# Patient Record
Sex: Male | Born: 1945
Health system: Southern US, Community
[De-identification: ages and names within clinical notes are randomized; demographics above are authoritative.]

## PROBLEM LIST (undated history)

## (undated) DIAGNOSIS — I2510 Atherosclerotic heart disease of native coronary artery without angina pectoris: Secondary | ICD-10-CM

## (undated) DIAGNOSIS — J189 Pneumonia, unspecified organism: Secondary | ICD-10-CM

## (undated) DIAGNOSIS — R011 Cardiac murmur, unspecified: Secondary | ICD-10-CM

## (undated) DIAGNOSIS — K432 Incisional hernia without obstruction or gangrene: Secondary | ICD-10-CM

## (undated) DIAGNOSIS — E039 Hypothyroidism, unspecified: Secondary | ICD-10-CM

## (undated) DIAGNOSIS — I10 Essential (primary) hypertension: Secondary | ICD-10-CM

## (undated) DIAGNOSIS — C649 Malignant neoplasm of unspecified kidney, except renal pelvis: Secondary | ICD-10-CM

## (undated) DIAGNOSIS — C3490 Malignant neoplasm of unspecified part of unspecified bronchus or lung: Secondary | ICD-10-CM

## (undated) DIAGNOSIS — N189 Chronic kidney disease, unspecified: Secondary | ICD-10-CM

## (undated) DIAGNOSIS — M1990 Unspecified osteoarthritis, unspecified site: Secondary | ICD-10-CM

## (undated) DIAGNOSIS — N2889 Other specified disorders of kidney and ureter: Secondary | ICD-10-CM

## (undated) DIAGNOSIS — C7800 Secondary malignant neoplasm of unspecified lung: Secondary | ICD-10-CM

## (undated) DIAGNOSIS — K429 Umbilical hernia without obstruction or gangrene: Secondary | ICD-10-CM

## (undated) DIAGNOSIS — K219 Gastro-esophageal reflux disease without esophagitis: Secondary | ICD-10-CM

## (undated) DIAGNOSIS — Z85118 Personal history of other malignant neoplasm of bronchus and lung: Secondary | ICD-10-CM

## (undated) DIAGNOSIS — E7800 Pure hypercholesterolemia, unspecified: Secondary | ICD-10-CM

## (undated) DIAGNOSIS — I493 Ventricular premature depolarization: Secondary | ICD-10-CM

## (undated) HISTORY — DX: Ventricular premature depolarization: I49.3

## (undated) HISTORY — PX: APPENDECTOMY: SHX54

---
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
## 1978-10-18 DIAGNOSIS — Z85118 Personal history of other malignant neoplasm of bronchus and lung: Secondary | ICD-10-CM

## 1978-10-18 HISTORY — PX: CHOLECYSTECTOMY: SHX55

## 1978-10-18 HISTORY — DX: Personal history of other malignant neoplasm of bronchus and lung: Z85.118

## 1990-02-16 HISTORY — PX: LUNG LOBECTOMY: SHX167

## 1991-02-17 DIAGNOSIS — C3490 Malignant neoplasm of unspecified part of unspecified bronchus or lung: Secondary | ICD-10-CM

## 1991-02-17 HISTORY — DX: Malignant neoplasm of unspecified part of unspecified bronchus or lung: C34.90

## 2000-04-19 ENCOUNTER — Encounter: Admission: RE | Admit: 2000-04-19 | Discharge: 2000-04-19 | Payer: Self-pay | Admitting: Gastroenterology

## 2000-04-19 ENCOUNTER — Encounter: Payer: Self-pay | Admitting: Gastroenterology

## 2000-05-07 ENCOUNTER — Other Ambulatory Visit: Admission: RE | Admit: 2000-05-07 | Discharge: 2000-05-07 | Payer: Self-pay | Admitting: Gastroenterology

## 2008-06-27 ENCOUNTER — Ambulatory Visit (HOSPITAL_COMMUNITY): Admission: RE | Admit: 2008-06-27 | Discharge: 2008-06-27 | Payer: Self-pay | Admitting: Cardiology

## 2009-02-16 HISTORY — PX: PILONIDAL CYST EXCISION: SHX744

## 2009-11-15 ENCOUNTER — Encounter (INDEPENDENT_AMBULATORY_CARE_PROVIDER_SITE_OTHER): Payer: Self-pay | Admitting: Surgery

## 2009-11-15 ENCOUNTER — Ambulatory Visit (HOSPITAL_COMMUNITY): Admission: RE | Admit: 2009-11-15 | Discharge: 2009-11-16 | Payer: Self-pay | Admitting: Surgery

## 2010-05-01 LAB — DIFFERENTIAL
Basophils Absolute: 0 10*3/uL (ref 0.0–0.1)
Basophils Relative: 0 % (ref 0–1)
Eosinophils Absolute: 0.2 10*3/uL (ref 0.0–0.7)
Eosinophils Relative: 3 % (ref 0–5)
Lymphocytes Relative: 25 % (ref 12–46)
Lymphs Abs: 1.7 10*3/uL (ref 0.7–4.0)
Monocytes Absolute: 0.7 10*3/uL (ref 0.1–1.0)
Monocytes Relative: 10 % (ref 3–12)
Neutro Abs: 4 10*3/uL (ref 1.7–7.7)
Neutrophils Relative %: 61 % (ref 43–77)

## 2010-05-01 LAB — CBC
HCT: 41.4 % (ref 39.0–52.0)
Hemoglobin: 14.4 g/dL (ref 13.0–17.0)
MCH: 31.7 pg (ref 26.0–34.0)
MCHC: 34.9 g/dL (ref 30.0–36.0)
MCV: 90.8 fL (ref 78.0–100.0)
Platelets: 207 10*3/uL (ref 150–400)
RBC: 4.56 MIL/uL (ref 4.22–5.81)
RDW: 12.9 % (ref 11.5–15.5)
WBC: 6.6 10*3/uL (ref 4.0–10.5)

## 2010-05-01 LAB — SURGICAL PCR SCREEN: MRSA, PCR: NEGATIVE

## 2010-05-01 LAB — URINALYSIS, ROUTINE W REFLEX MICROSCOPIC
Bilirubin Urine: NEGATIVE
Hgb urine dipstick: NEGATIVE
Specific Gravity, Urine: 1.02 (ref 1.005–1.030)
pH: 6 (ref 5.0–8.0)

## 2010-05-01 LAB — BASIC METABOLIC PANEL
BUN: 18 mg/dL (ref 6–23)
CO2: 32 mEq/L (ref 19–32)
Calcium: 9.3 mg/dL (ref 8.4–10.5)
Chloride: 101 mEq/L (ref 96–112)
Creatinine, Ser: 1.03 mg/dL (ref 0.4–1.5)
GFR calc Af Amer: >60 mL/min (ref >60)
GFR calc non Af Amer: >60 mL/min (ref >60)
Glucose, Bld: 118 mg/dL — ABNORMAL HIGH (ref 70–99)
Potassium: 4.2 mEq/L (ref 3.5–5.1)
Sodium: 139 mEq/L (ref 135–145)

## 2010-05-01 LAB — PROTIME-INR
INR: 0.97 (ref 0.00–1.49)
Prothrombin Time: 13.1 seconds (ref 11.6–15.2)

## 2010-07-01 NOTE — Cardiovascular Report (Signed)
[name], [name]                  ACCOUNT NO.:  [id number]   MEDICAL RECORD NO.:  [id number]          PATIENT TYPE:  OIB   LOCATION:  2899                         FACILITY:  [facility]   PHYSICIAN:  Ezzard Standing, M.D.DATE OF BIRTH:  1945-03-08   DATE OF PROCEDURE:  06/27/2008  DATE OF DISCHARGE:                            CARDIAC CATHETERIZATION   HISTORY:  A 65 year old male with previous pneumonectomy for lung  cancer.  The patient presented with exertional fatigue and chest  discomfort and had an abnormal Cardiolite study with inferior ischemia.   PROCEDURE:  Left heart catheterization with coronary angiograms and left  ventriculogram,.   PROCEDURE:  The patient tolerated the procedure well without  complications.  It was done as an outpatient basis.  The right femoral  artery was introduced and a single anterior needle wall stick and  catheters used were 6-French catheters.  A no-torque right coronary  catheter was necessary to select the right coronary ostium.  He  tolerated the procedure well.   HEMODYNAMIC DATA:  Aorta postcontrast 142/70, LV postcontrast 142/8-15.   ANGIOGRAPHIC DATA:  Left ventriculogram:  Performed in the 30 degrees  RAO projection.  The aortic valve is normal.  The mitral valve is  normal.  The left ventricle appears normal in size.  Estimated ejection  fraction is 65-70%.  Coronary arteries arise and distribute normally.  There is calcification noted involving all three vessels.  It is mild to  moderate in nature.  Left main coronary artery is normal.  Left anterior  descending has either an occluded first diagonal branch or an ulcerated  plaque in the proximal portion of the vessel prior to the septal  perforator.  Very little in the way of collaterals is seen.  There is a  mild diffuse disease present and a 30% mid stenosis is noted.   CIRCUMFLEX CORONARY ARTERY:  An intermediate branch has a long segmental  80% stenosis and is somewhat small in  portion.  The circumflex has a  first marginal branch which has a segmental 50% stenosis which is  segmental in its proximal portion.  Collateral filling is seen of the  distal right coronary artery with bidirectional flow noted.  The right  coronary artery is calcified.  There is a long severe segmental stenosis  of 95-99% in the proximal to the midportion of the vessel with was some  evidence of bridging collaterals, but forward antegrade flow was noted.   IMPRESSION:  Coronary artery disease with subtotal occlusion of the  right coronary artery with bridging collaterals and left-to-right  collaterals, diffuse disease involving the proximal LAD, first marginal  branch intermediate branch.   RECOMMENDATIONS:  Initial intensive medical therapy at this time.  Consultation with interventional cardiologist to determine if PCI an  option, but angiographically appears to not be a good candidate for  this.      Ezzard Standing, M.D.  Electronically Signed     WST/MEDQ  D:  06/27/2008  T:  06/27/2008  Job:  [id number]   cc:   Claris Gower, M.D.

## 2012-09-09 ENCOUNTER — Ambulatory Visit
Admission: RE | Admit: 2012-09-09 | Discharge: 2012-09-09 | Disposition: A | Discharge status: Home / Self Care | Payer: Medicare Other | Source: Ambulatory Visit | Attending: Cardiology | Admitting: Cardiology

## 2012-09-09 ENCOUNTER — Other Ambulatory Visit: Payer: Self-pay | Admitting: Cardiology

## 2012-09-09 DIAGNOSIS — R5383 Other fatigue: Secondary | ICD-10-CM

## 2012-09-09 DIAGNOSIS — R5381 Other malaise: Secondary | ICD-10-CM

## 2014-10-26 ENCOUNTER — Emergency Department (HOSPITAL_COMMUNITY)
Admission: EM | Admit: 2014-10-26 | Discharge: 2014-10-26 | Disposition: A | Discharge status: Home / Self Care | Payer: Medicare Other | Attending: Emergency Medicine | Admitting: Emergency Medicine

## 2014-10-26 ENCOUNTER — Encounter (HOSPITAL_COMMUNITY): Payer: Self-pay | Admitting: *Deleted

## 2014-10-26 ENCOUNTER — Emergency Department (HOSPITAL_COMMUNITY): Payer: Medicare Other

## 2014-10-26 DIAGNOSIS — Z87891 Personal history of nicotine dependence: Secondary | ICD-10-CM | POA: Diagnosis not present

## 2014-10-26 DIAGNOSIS — I2510 Atherosclerotic heart disease of native coronary artery without angina pectoris: Secondary | ICD-10-CM | POA: Diagnosis not present

## 2014-10-26 DIAGNOSIS — Z85118 Personal history of other malignant neoplasm of bronchus and lung: Secondary | ICD-10-CM | POA: Diagnosis not present

## 2014-10-26 DIAGNOSIS — Z8639 Personal history of other endocrine, nutritional and metabolic disease: Secondary | ICD-10-CM | POA: Diagnosis not present

## 2014-10-26 DIAGNOSIS — R002 Palpitations: Secondary | ICD-10-CM | POA: Diagnosis not present

## 2014-10-26 HISTORY — DX: Atherosclerotic heart disease of native coronary artery without angina pectoris: I25.10

## 2014-10-26 HISTORY — DX: Malignant neoplasm of unspecified part of unspecified bronchus or lung: C34.90

## 2014-10-26 HISTORY — DX: Pure hypercholesterolemia, unspecified: E78.00

## 2014-10-26 LAB — BASIC METABOLIC PANEL
Anion gap: 7 (ref 5–15)
BUN: 19 mg/dL (ref 6–20)
CHLORIDE: 101 mmol/L (ref 101–111)
CO2: 30 mmol/L (ref 22–32)
CREATININE: 1.2 mg/dL (ref 0.61–1.24)
Calcium: 9.5 mg/dL (ref 8.9–10.3)
GFR calc Af Amer: >60 mL/min (ref >60)
GFR calc non Af Amer: 59 mL/min — ABNORMAL LOW (ref >60)
GLUCOSE: 124 mg/dL — AB (ref 65–99)
Potassium: 4.1 mmol/L (ref 3.5–5.1)
SODIUM: 138 mmol/L (ref 135–145)

## 2014-10-26 LAB — CBC
HCT: 41.2 % (ref 39.0–52.0)
Hemoglobin: 14.3 g/dL (ref 13.0–17.0)
MCH: 30.8 pg (ref 26.0–34.0)
MCHC: 34.7 g/dL (ref 30.0–36.0)
MCV: 88.8 fL (ref 78.0–100.0)
PLATELETS: 200 10*3/uL (ref 150–400)
RBC: 4.64 MIL/uL (ref 4.22–5.81)
RDW: 12.4 % (ref 11.5–15.5)
WBC: 6.8 10*3/uL (ref 4.0–10.5)

## 2014-10-26 LAB — I-STAT TROPONIN, ED: Troponin i, poc: 0 ng/mL (ref 0.00–0.08)

## 2014-10-26 NOTE — ED Notes (Signed)
Pt reports having episode of palpitations this am. Denies any cp or sob. ekg done at triage, HR 89.

## 2014-10-26 NOTE — Discharge Instructions (Signed)

## 2014-10-26 NOTE — ED Provider Notes (Signed)
CSN: [id number]     Arrival date & time 10/26/14  1218 History   First MD Initiated Contact with Patient 10/26/14 1639     Chief Complaint  Patient presents with  . Palpitations   Patient is a 69 y.o. male presenting with palpitations. The history is provided by the patient.  Palpitations Palpitations quality:  Fast Onset quality:  Sudden Duration:  30 minutes (This episode occurred around 1030 am.) Timing:  Intermittent Progression:  Resolved Chronicity:  Recurrent Relieved by:  Nothing Worsened by:  Nothing Ineffective treatments:  None tried Associated symptoms: no chest pain, no shortness of breath, no syncope, no vomiting and no weakness   Risk factors: heart disease   Risk factors: no hx of atrial fibrillation   Pt has had it in the past.  He sees Dr Wynonia Lawman.  He did wear a holter monitor for a period of time but they did not find anything.  Since the episode earlier today he has felt fine.  No recurrent episodes.  He called his heart doctor and was told to come to the ED.  Past Medical History  Diagnosis Date  . Coronary artery disease   . Cancer   . Lung cancer   . High cholesterol    Past Surgical History  Procedure Laterality Date  . Lung lobectomy     History reviewed. No pertinent family history. Social History  Substance Use Topics  . Smoking status: Former Research scientist (life sciences)  . Smokeless tobacco: None  . Alcohol Use: No    Review of Systems  Respiratory: Negative for shortness of breath.   Cardiovascular: Positive for palpitations. Negative for chest pain and syncope.  Gastrointestinal: Negative for vomiting.  Neurological: Negative for weakness.      Allergies  Review of patient's allergies indicates no known allergies.  Home Medications   Prior to Admission medications   Not on File   BP 110/86 mmHg  Pulse 92  Temp(Src) 98.1 F (36.7 C) (Oral)  Resp 18  Ht '5\' 9"'$  (1.753 m)  Wt 212 lb 3.2 oz (96.253 kg)  BMI 31.32 kg/m2  SpO2 95% Physical Exam   Constitutional: He appears well-developed and well-nourished. No distress.  HENT:  Head: Normocephalic and atraumatic.  Right Ear: External ear normal.  Left Ear: External ear normal.  Eyes: Conjunctivae are normal. Right eye exhibits no discharge. Left eye exhibits no discharge. No scleral icterus.  Neck: Neck supple. No tracheal deviation present.  Cardiovascular: Normal rate, regular rhythm and intact distal pulses.   Pulmonary/Chest: Effort normal and breath sounds normal. No stridor. No respiratory distress. He has no wheezes. He has no rales.  Abdominal: Soft. Bowel sounds are normal. He exhibits no distension. There is no tenderness. There is no rebound and no guarding.  Musculoskeletal: He exhibits no edema or tenderness.  Neurological: He is alert. He has normal strength. No cranial nerve deficit (no facial droop, extraocular movements intact, no slurred speech) or sensory deficit. He exhibits normal muscle tone. He displays no seizure activity. Coordination normal.  Skin: Skin is warm and dry. No rash noted.  Psychiatric: He has a normal mood and affect.  Nursing note and vitals reviewed.   ED Course  Procedures (including critical care time) Labs Review Labs Reviewed  BASIC METABOLIC PANEL - Abnormal; Notable for the following:    Glucose, Bld 124 (*)    GFR calc non Af Amer 59 (*)    All other components within normal limits  CBC  I-STAT TROPOININ, ED  Imaging Review Dg Chest 2 View  10/26/2014   CLINICAL DATA:  Lung cancer.  Palpitations  EXAM: CHEST  2 VIEW  COMPARISON:  07/25/2014a  FINDINGS: Postop lobectomy on the right. Surgical clips in the right hilum. Volume loss in the right lung base with pleural and parenchymal scarring, unchanged from the prior study. No recurrent mass. Negative for pneumonia. 8 mm left upper lobe density is stable.  IMPRESSION: Postop change on the right are stable.  No acute abnormality.   Electronically Signed   By: Franchot Gallo M.D.    On: 10/26/2014 13:09   I have personally reviewed and evaluated these images and lab results as part of my medical decision-making.   EKG Interpretation   Date/Time:  Friday October 26 2014 12:26:48 EDT Ventricular Rate:  89 PR Interval:  164 QRS Duration: 78 QT Interval:  346 QTC Calculation: 420 R Axis:   15 Text Interpretation:  Normal sinus rhythm with sinus arrhythmia Normal ECG  Confirmed by Hazle Coca [phone]) on 10/26/2014 4:27:57 PM      MDM   Final diagnoses:  Palpitations    Patient's EKG showed a normal sinus rhythm. He has had no further episodes since the episode this morning. Patient states he had a similar episode several months ago and wore some type of cardiac monitor for 1 month. They never found any abnormal heart rhythms. This is the first time this has happened since that previous episode. Patient did not lose consciousness. He did not have syncope. I think he is stable for outpatient follow-up with his cardiologist. They may consider further cardiac monitoring.    Dorie Rank, MD 10/26/14 1710

## 2014-12-27 ENCOUNTER — Other Ambulatory Visit (HOSPITAL_COMMUNITY): Payer: Self-pay | Admitting: Family Medicine

## 2014-12-27 DIAGNOSIS — N2889 Other specified disorders of kidney and ureter: Secondary | ICD-10-CM

## 2014-12-28 ENCOUNTER — Inpatient Hospital Stay
Admission: RE | Admit: 2014-12-28 | Discharge: 2014-12-28 | Disposition: A | Discharge status: Home / Self Care | Payer: Self-pay | Source: Ambulatory Visit | Attending: Family Medicine | Admitting: Family Medicine

## 2014-12-28 ENCOUNTER — Other Ambulatory Visit (HOSPITAL_COMMUNITY): Payer: Self-pay | Admitting: Family Medicine

## 2014-12-28 DIAGNOSIS — R52 Pain, unspecified: Secondary | ICD-10-CM

## 2015-01-15 ENCOUNTER — Other Ambulatory Visit: Payer: Self-pay | Admitting: Urology

## 2015-01-28 NOTE — Patient Instructions (Addendum)
YOUR PROCEDURE IS SCHEDULED ON :  02/01/15  REPORT TO [facility] MAIN ENTRANCE FOLLOW SIGNS TO EAST ELEVATOR - GO TO 3rd FLOOR CHECK IN AT 3 EAST NURSES STATION (SHORT STAY) AT:  5:15 AM  CALL THIS NUMBER IF YOU HAVE PROBLEMS THE MORNING OF SURGERY [phone]  REMEMBER:ONLY 1 PER PERSON MAY GO TO SHORT STAY WITH YOU TO GET READY THE MORNING OF YOUR SURGERY  DO NOT EAT FOOD OR DRINK LIQUIDS AFTER MIDNIGHT  TAKE THESE MEDICINES THE MORNING OF SURGERY: AMLODIPINE / LIPITOR / OMEPRAZOLE  YOU MAY NOT HAVE ANY METAL ON YOUR BODY INCLUDING HAIR PINS AND PIERCING'S. DO NOT WEAR JEWELRY, MAKEUP, LOTIONS, POWDERS OR PERFUMES. DO NOT WEAR NAIL POLISH. DO NOT SHAVE 48 HRS PRIOR TO SURGERY. MEN MAY SHAVE FACE AND NECK.  DO NOT [facility]. [facility] IS NOT RESPONSIBLE FOR VALUABLES.  CONTACTS, DENTURES OR PARTIALS MAY NOT BE WORN TO SURGERY. LEAVE SUITCASE IN CAR. CAN BE BROUGHT TO ROOM AFTER SURGERY.  PATIENTS DISCHARGED THE DAY OF SURGERY WILL NOT BE ALLOWED TO DRIVE HOME.  PLEASE READ OVER THE FOLLOWING INSTRUCTION SHEETS _________________________________________________________________________________                                          [facility] - PREPARING FOR SURGERY  Before surgery, you can play an important role.  Because skin is not sterile, your skin needs to be as free of germs as possible.  You can reduce the number of germs on your skin by washing with CHG (chlorahexidine gluconate) soap before surgery.  CHG is an antiseptic cleaner which kills germs and bonds with the skin to continue killing germs even after washing. Please DO NOT use if you have an allergy to CHG or antibacterial soaps.  If your skin becomes reddened/irritated stop using the CHG and inform your nurse when you arrive at Short Stay. Do not shave (including legs and underarms) for at least 48 hours prior to the first CHG shower.  You may shave your face. Please follow  these instructions carefully:   1.  Shower with CHG Soap the night before surgery and the  morning of Surgery.   2.  If you choose to wash your hair, wash your hair first as usual with your  normal  Shampoo.   3.  After you shampoo, rinse your hair and body thoroughly to remove the  shampoo.                                         4.  Use CHG as you would any other liquid soap.  You can apply chg directly  to the skin and wash . Gently wash with scrungie or clean wascloth    5.  Apply the CHG Soap to your body ONLY FROM THE NECK DOWN.   Do not use on open                           Wound or open sores. Avoid contact with eyes, ears mouth and genitals (private parts).                        Genitals (private parts) with your  normal soap.              6.  Wash thoroughly, paying special attention to the area where your surgery  will be performed.   7.  Thoroughly rinse your body with warm water from the neck down.   8.  DO NOT shower/wash with your normal soap after using and rinsing off  the CHG Soap .                9.  Pat yourself dry with a clean towel.             10.  Wear clean night clothes to bed after shower             11.  Place clean sheets on your bed the night of your first shower and do not  sleep with pets.  Day of Surgery : Do not apply any lotions/deodorants the morning of surgery.  Please wear clean clothes to the hospital/surgery center.  FAILURE TO FOLLOW THESE INSTRUCTIONS MAY RESULT IN THE CANCELLATION OF YOUR SURGERY    PATIENT SIGNATURE_________________________________  ______________________________________________________________________    WHAT IS A BLOOD TRANSFUSION? Blood Transfusion Information  A transfusion is the replacement of blood or some of its parts. Blood is made up of multiple cells which provide different functions.  Red blood cells carry oxygen and are used for blood loss replacement.  White blood cells fight against  infection.  Platelets control bleeding.  Plasma helps clot blood.  Other blood products are available for specialized needs, such as hemophilia or other clotting disorders. BEFORE THE TRANSFUSION  Who gives blood for transfusions?   Healthy volunteers who are fully evaluated to make sure their blood is safe. This is blood bank blood. Transfusion therapy is the safest it has ever been in the practice of medicine. Before blood is taken from a donor, a complete history is taken to make sure that person has no history of diseases nor engages in risky social behavior (examples are intravenous drug use or sexual activity with multiple partners). The donor's travel history is screened to minimize risk of transmitting infections, such as malaria. The donated blood is tested for signs of infectious diseases, such as HIV and hepatitis. The blood is then tested to be sure it is compatible with you in order to minimize the chance of a transfusion reaction. If you or a relative donates blood, this is often done in anticipation of surgery and is not appropriate for emergency situations. It takes many days to process the donated blood. RISKS AND COMPLICATIONS Although transfusion therapy is very safe and saves many lives, the main dangers of transfusion include:   Getting an infectious disease.  Developing a transfusion reaction. This is an allergic reaction to something in the blood you were given. Every precaution is taken to prevent this. The decision to have a blood transfusion has been considered carefully by your caregiver before blood is given. Blood is not given unless the benefits outweigh the risks. AFTER THE TRANSFUSION  Right after receiving a blood transfusion, you will usually feel much better and more energetic. This is especially true if your red blood cells have gotten low (anemic). The transfusion raises the level of the red blood cells which carry oxygen, and this usually causes an energy  increase.  The nurse administering the transfusion will monitor you carefully for complications. HOME CARE INSTRUCTIONS  No special instructions are needed after a transfusion. You may find your energy is better.  Speak with your caregiver about any limitations on activity for underlying diseases you may have. SEEK MEDICAL CARE IF:   Your condition is not improving after your transfusion.  You develop redness or irritation at the intravenous (IV) site. SEEK IMMEDIATE MEDICAL CARE IF:  Any of the following symptoms occur over the next 12 hours:  Shaking chills.  You have a temperature by mouth above 102 F (38.9 C), not controlled by medicine.  Chest, back, or muscle pain.  People around you feel you are not acting correctly or are confused.  Shortness of breath or difficulty breathing.  Dizziness and fainting.  You get a rash or develop hives.  You have a decrease in urine output.  Your urine turns a dark color or changes to pink, red, or brown. Any of the following symptoms occur over the next 10 days:  You have a temperature by mouth above 102 F (38.9 C), not controlled by medicine.  Shortness of breath.  Weakness after normal activity.  The white part of the eye turns yellow (jaundice).  You have a decrease in the amount of urine or are urinating less often.  Your urine turns a dark color or changes to pink, red, or brown. Document Released: 01/31/2000 Document Revised: 04/27/2011 Document Reviewed: 09/19/2007 ExitCare Patient Information 2014 ExitCare, [place].  _______________________________________________________________________      CLEAR LIQUID DIET   Foods Allowed                                                                     Foods Excluded  Coffee and tea, regular and decaf                             liquids that you cannot  Plain Jell-O in any flavor                                             see through such as: Fruit ices (not with  fruit pulp)                                     milk, soups, orange juice  Iced Popsicles                                                          All solid food Carbonated beverages, regular and diet                                    Cranberry, grape and apple juices Sports drinks like Gatorade Lightly seasoned clear broth or consume(fat free) Sugar, honey syrup   _____________________________________________________________________

## 2015-01-29 ENCOUNTER — Encounter (HOSPITAL_COMMUNITY)
Admission: RE | Admit: 2015-01-29 | Discharge: 2015-01-29 | Disposition: A | Discharge status: Home / Self Care | Payer: Medicare Other | Source: Ambulatory Visit | Attending: Urology | Admitting: Urology

## 2015-01-29 ENCOUNTER — Encounter (INDEPENDENT_AMBULATORY_CARE_PROVIDER_SITE_OTHER): Payer: Self-pay

## 2015-01-29 ENCOUNTER — Encounter (HOSPITAL_COMMUNITY): Payer: Self-pay

## 2015-01-29 HISTORY — DX: Personal history of other malignant neoplasm of bronchus and lung: Z85.118

## 2015-01-29 HISTORY — DX: Gastro-esophageal reflux disease without esophagitis: K21.9

## 2015-01-29 HISTORY — DX: Other specified disorders of kidney and ureter: N28.89

## 2015-01-29 HISTORY — DX: Unspecified osteoarthritis, unspecified site: M19.90

## 2015-01-29 HISTORY — DX: Essential (primary) hypertension: I10

## 2015-01-29 LAB — COMPREHENSIVE METABOLIC PANEL
ALT: 23 U/L (ref 17–63)
AST: 21 U/L (ref 15–41)
Albumin: 4.7 g/dL (ref 3.5–5.0)
Alkaline Phosphatase: 42 U/L (ref 38–126)
Anion gap: 8 (ref 5–15)
BUN: 20 mg/dL (ref 6–20)
CHLORIDE: 101 mmol/L (ref 101–111)
CO2: 31 mmol/L (ref 22–32)
Calcium: 9.4 mg/dL (ref 8.9–10.3)
Creatinine, Ser: 1.09 mg/dL (ref 0.61–1.24)
GFR calc Af Amer: >60 mL/min (ref >60)
Glucose, Bld: 121 mg/dL — ABNORMAL HIGH (ref 65–99)
POTASSIUM: 4 mmol/L (ref 3.5–5.1)
Sodium: 140 mmol/L (ref 135–145)
Total Bilirubin: 0.7 mg/dL (ref 0.3–1.2)
Total Protein: 8 g/dL (ref 6.5–8.1)

## 2015-01-29 LAB — ABO/RH: ABO/RH(D): B POS

## 2015-01-29 LAB — TYPE AND SCREEN
ABO/RH(D): B POS
ANTIBODY SCREEN: NEGATIVE

## 2015-01-29 LAB — CBC
HCT: 40.2 % (ref 39.0–52.0)
Hemoglobin: 14.1 g/dL (ref 13.0–17.0)
MCH: 31.3 pg (ref 26.0–34.0)
MCHC: 35.1 g/dL (ref 30.0–36.0)
MCV: 89.1 fL (ref 78.0–100.0)
PLATELETS: 214 10*3/uL (ref 150–400)
RBC: 4.51 MIL/uL (ref 4.22–5.81)
RDW: 12.5 % (ref 11.5–15.5)
WBC: 7.1 10*3/uL (ref 4.0–10.5)

## 2015-01-29 NOTE — Progress Notes (Signed)
   01/29/15 0854  OBSTRUCTIVE SLEEP APNEA  Have you ever been diagnosed with sleep apnea through a sleep study? No  Do you snore loudly (loud enough to be heard through closed doors)?  0  Do you often feel tired, fatigued, or sleepy during the daytime (such as falling asleep during driving or talking to someone)? 1  Has anyone observed you stop breathing during your sleep? 0  Do you have, or are you being treated for high blood pressure? 1  BMI more than 35 kg/m2? 0  Age > 50 (1-yes) 1  Neck circumference greater than:Male 16 inches or larger, Male 17inches or larger? 1  Male Gender (Yes=1) 1  Obstructive Sleep Apnea Score 5  Score 5 or greater  Results sent to PCP

## 2015-01-30 LAB — URINE CULTURE

## 2015-01-31 ENCOUNTER — Encounter (HOSPITAL_COMMUNITY): Payer: Self-pay | Admitting: Anesthesiology

## 2015-01-31 NOTE — Anesthesia Preprocedure Evaluation (Addendum)
Anesthesia Evaluation  Patient identified by MRN, date of birth, ID band Patient awake    Reviewed: Allergy & Precautions, NPO status , Patient's Chart, lab work & pertinent test results  Airway Mallampati: II  TM Distance: >3 FB Neck ROM: Full    Dental no notable dental hx.    Pulmonary former smoker,  S/p right lung lobectomy 1992   Pulmonary exam normal breath sounds clear to auscultation       Cardiovascular Exercise Tolerance: Good hypertension, Pt. on medications + CAD  Normal cardiovascular exam Rhythm:Regular Rate:Normal     Neuro/Psych negative neurological ROS  negative psych ROS   GI/Hepatic Neg liver ROS, GERD  Medicated,  Endo/Other  negative endocrine ROS  Renal/GU negative Renal ROS  negative genitourinary   Musculoskeletal  (+) Arthritis ,   Abdominal (+) + obese,   Peds negative pediatric ROS (+)  Hematology negative hematology ROS (+)   Anesthesia Other Findings   Reproductive/Obstetrics negative OB ROS                            Anesthesia Physical Anesthesia Plan  ASA: III  Anesthesia Plan: General   Post-op Pain Management:    Induction: Intravenous  Airway Management Planned: Oral ETT  Additional Equipment:   Intra-op Plan:   Post-operative Plan: Extubation in OR  Informed Consent: I have reviewed the patients History and Physical, chart, labs and discussed the procedure including the risks, benefits and alternatives for the proposed anesthesia with the patient or authorized representative who has indicated his/her understanding and acceptance.   Dental advisory given  Plan Discussed with: CRNA  Anesthesia Plan Comments:         Anesthesia Quick Evaluation

## 2015-02-01 ENCOUNTER — Encounter (HOSPITAL_COMMUNITY)
Admission: RE | Disposition: A | Discharge status: Home / Self Care | Payer: Self-pay | Source: Ambulatory Visit | Attending: Urology

## 2015-02-01 ENCOUNTER — Inpatient Hospital Stay (HOSPITAL_COMMUNITY)
Admission: RE | Admit: 2015-02-01 | Discharge: 2015-02-05 | DRG: 658 | Disposition: A | Discharge status: Home / Self Care | Payer: Medicare Other | Source: Ambulatory Visit | Attending: Urology | Admitting: Urology

## 2015-02-01 ENCOUNTER — Inpatient Hospital Stay (HOSPITAL_COMMUNITY): Payer: Medicare Other | Admitting: Anesthesiology

## 2015-02-01 ENCOUNTER — Encounter (HOSPITAL_COMMUNITY): Payer: Self-pay | Admitting: *Deleted

## 2015-02-01 DIAGNOSIS — K219 Gastro-esophageal reflux disease without esophagitis: Secondary | ICD-10-CM | POA: Diagnosis present

## 2015-02-01 DIAGNOSIS — M1990 Unspecified osteoarthritis, unspecified site: Secondary | ICD-10-CM | POA: Diagnosis present

## 2015-02-01 DIAGNOSIS — Z9049 Acquired absence of other specified parts of digestive tract: Secondary | ICD-10-CM

## 2015-02-01 DIAGNOSIS — M549 Dorsalgia, unspecified: Secondary | ICD-10-CM | POA: Diagnosis not present

## 2015-02-01 DIAGNOSIS — Z79899 Other long term (current) drug therapy: Secondary | ICD-10-CM | POA: Diagnosis not present

## 2015-02-01 DIAGNOSIS — Z902 Acquired absence of lung [part of]: Secondary | ICD-10-CM

## 2015-02-01 DIAGNOSIS — Z7982 Long term (current) use of aspirin: Secondary | ICD-10-CM | POA: Diagnosis not present

## 2015-02-01 DIAGNOSIS — N3289 Other specified disorders of bladder: Secondary | ICD-10-CM | POA: Diagnosis not present

## 2015-02-01 DIAGNOSIS — Z87891 Personal history of nicotine dependence: Secondary | ICD-10-CM | POA: Diagnosis not present

## 2015-02-01 DIAGNOSIS — E669 Obesity, unspecified: Secondary | ICD-10-CM | POA: Diagnosis present

## 2015-02-01 DIAGNOSIS — N2889 Other specified disorders of kidney and ureter: Secondary | ICD-10-CM

## 2015-02-01 DIAGNOSIS — C641 Malignant neoplasm of right kidney, except renal pelvis: Secondary | ICD-10-CM | POA: Diagnosis present

## 2015-02-01 DIAGNOSIS — Z6830 Body mass index (BMI) 30.0-30.9, adult: Secondary | ICD-10-CM | POA: Diagnosis not present

## 2015-02-01 DIAGNOSIS — Z85118 Personal history of other malignant neoplasm of bronchus and lung: Secondary | ICD-10-CM

## 2015-02-01 DIAGNOSIS — I10 Essential (primary) hypertension: Secondary | ICD-10-CM | POA: Diagnosis present

## 2015-02-01 DIAGNOSIS — C649 Malignant neoplasm of unspecified kidney, except renal pelvis: Secondary | ICD-10-CM | POA: Diagnosis present

## 2015-02-01 DIAGNOSIS — K660 Peritoneal adhesions (postprocedural) (postinfection): Secondary | ICD-10-CM | POA: Diagnosis present

## 2015-02-01 HISTORY — PX: ROBOTIC ASSITED PARTIAL NEPHRECTOMY: SHX6087

## 2015-02-01 HISTORY — DX: Malignant neoplasm of unspecified kidney, except renal pelvis: C64.9

## 2015-02-01 LAB — HEMOGLOBIN AND HEMATOCRIT, BLOOD
HEMATOCRIT: 34.9 % — AB (ref 39.0–52.0)
Hemoglobin: 12.4 g/dL — ABNORMAL LOW (ref 13.0–17.0)

## 2015-02-01 SURGERY — ROBOTIC ASSITED PARTIAL NEPHRECTOMY
Anesthesia: General | Laterality: Right

## 2015-02-01 MED ORDER — LISINOPRIL 20 MG PO TABS
20.0000 mg | ORAL_TABLET | Freq: Every day | ORAL | Status: DC
  Administered 2015-02-01 – 2015-02-05 (×5): 20 mg via ORAL
  Filled 2015-02-01 (×5): qty 1

## 2015-02-01 MED ORDER — DIPHENHYDRAMINE HCL 50 MG/ML IJ SOLN: 12.5000 mg | Freq: Four times a day (QID) | INTRAMUSCULAR | Status: DC | PRN

## 2015-02-01 MED ORDER — DEXAMETHASONE SODIUM PHOSPHATE 10 MG/ML IJ SOLN
INTRAMUSCULAR | Status: DC | PRN
  Administered 2015-02-01: 10 mg via INTRAVENOUS

## 2015-02-01 MED ORDER — DEXTROSE-NACL 5-0.45 % IV SOLN
INTRAVENOUS | Status: DC
  Administered 2015-02-01 – 2015-02-02 (×3): via INTRAVENOUS

## 2015-02-01 MED ORDER — BUPIVACAINE LIPOSOME 1.3 % IJ SUSP
20.0000 mL | Freq: Once | INTRAMUSCULAR | Status: DC
  Filled 2015-02-01: qty 20

## 2015-02-01 MED ORDER — HYDROCHLOROTHIAZIDE 25 MG PO TABS
25.0000 mg | ORAL_TABLET | Freq: Every day | ORAL | Status: DC
  Administered 2015-02-01 – 2015-02-05 (×5): 25 mg via ORAL
  Filled 2015-02-01 (×5): qty 1

## 2015-02-01 MED ORDER — ONDANSETRON HCL 4 MG/2ML IJ SOLN
4.0000 mg | INTRAMUSCULAR | Status: DC | PRN
  Administered 2015-02-05: 4 mg via INTRAVENOUS
  Filled 2015-02-01: qty 2

## 2015-02-01 MED ORDER — PANTOPRAZOLE SODIUM 40 MG PO TBEC
40.0000 mg | DELAYED_RELEASE_TABLET | Freq: Every day | ORAL | Status: DC
  Administered 2015-02-01 – 2015-02-05 (×5): 40 mg via ORAL
  Filled 2015-02-01 (×6): qty 1

## 2015-02-01 MED ORDER — ONDANSETRON HCL 4 MG/2ML IJ SOLN
INTRAMUSCULAR | Status: DC | PRN
  Administered 2015-02-01: 4 mg via INTRAVENOUS

## 2015-02-01 MED ORDER — ROCURONIUM BROMIDE 100 MG/10ML IV SOLN
INTRAVENOUS | Status: DC | PRN
  Administered 2015-02-01: 50 mg via INTRAVENOUS
  Administered 2015-02-01: 10 mg via INTRAVENOUS
  Administered 2015-02-01 (×2): 20 mg via INTRAVENOUS
  Administered 2015-02-01: 10 mg via INTRAVENOUS

## 2015-02-01 MED ORDER — HYDROMORPHONE HCL 1 MG/ML IJ SOLN
INTRAMUSCULAR | Status: AC
  Filled 2015-02-01: qty 1

## 2015-02-01 MED ORDER — ATORVASTATIN CALCIUM 10 MG PO TABS
10.0000 mg | ORAL_TABLET | ORAL | Status: DC
  Administered 2015-02-03 – 2015-02-05 (×2): 10 mg via ORAL
  Filled 2015-02-01 (×3): qty 1

## 2015-02-01 MED ORDER — HYDROMORPHONE HCL 1 MG/ML IJ SOLN
INTRAMUSCULAR | Status: DC | PRN
  Administered 2015-02-01 (×3): .2 mg via INTRAVENOUS
  Administered 2015-02-01: .4 mg via INTRAVENOUS

## 2015-02-01 MED ORDER — CEFAZOLIN SODIUM-DEXTROSE 2-3 GM-% IV SOLR
2.0000 g | INTRAVENOUS | Status: AC
  Administered 2015-02-01 (×2): 2 g via INTRAVENOUS

## 2015-02-01 MED ORDER — HEMOSTATIC AGENTS (NO CHARGE) OPTIME
TOPICAL | Status: DC | PRN
  Administered 2015-02-01: 1

## 2015-02-01 MED ORDER — PHENYLEPHRINE 40 MCG/ML (10ML) SYRINGE FOR IV PUSH (FOR BLOOD PRESSURE SUPPORT)
PREFILLED_SYRINGE | INTRAVENOUS | Status: AC
  Filled 2015-02-01: qty 10

## 2015-02-01 MED ORDER — SUGAMMADEX SODIUM 500 MG/5ML IV SOLN
INTRAVENOUS | Status: AC
  Filled 2015-02-01: qty 5

## 2015-02-01 MED ORDER — HYDROMORPHONE HCL 1 MG/ML IJ SOLN
0.2500 mg | INTRAMUSCULAR | Status: DC | PRN
  Administered 2015-02-01 (×2): 0.25 mg via INTRAVENOUS

## 2015-02-01 MED ORDER — SODIUM CHLORIDE 0.9 % IJ SOLN
INTRAMUSCULAR | Status: AC
  Filled 2015-02-01: qty 10

## 2015-02-01 MED ORDER — CEFAZOLIN SODIUM-DEXTROSE 2-3 GM-% IV SOLR
INTRAVENOUS | Status: AC
  Filled 2015-02-01: qty 50

## 2015-02-01 MED ORDER — SUCCINYLCHOLINE CHLORIDE 20 MG/ML IJ SOLN
INTRAMUSCULAR | Status: DC | PRN
  Administered 2015-02-01: 100 mg via INTRAVENOUS

## 2015-02-01 MED ORDER — PHENYLEPHRINE HCL 10 MG/ML IJ SOLN
INTRAMUSCULAR | Status: DC | PRN
  Administered 2015-02-01 (×4): 40 ug via INTRAVENOUS

## 2015-02-01 MED ORDER — ROCURONIUM BROMIDE 100 MG/10ML IV SOLN
INTRAVENOUS | Status: AC
  Filled 2015-02-01: qty 1

## 2015-02-01 MED ORDER — CIPROFLOXACIN IN D5W 400 MG/200ML IV SOLN
INTRAVENOUS | Status: AC
  Filled 2015-02-01: qty 200

## 2015-02-01 MED ORDER — HYDROMORPHONE HCL 2 MG/ML IJ SOLN
INTRAMUSCULAR | Status: AC
  Filled 2015-02-01: qty 1

## 2015-02-01 MED ORDER — PROMETHAZINE HCL 25 MG/ML IJ SOLN: 6.2500 mg | INTRAMUSCULAR | Status: DC | PRN

## 2015-02-01 MED ORDER — LIDOCAINE HCL (CARDIAC) 20 MG/ML IV SOLN
INTRAVENOUS | Status: DC | PRN
  Administered 2015-02-01: 100 mg via INTRAVENOUS

## 2015-02-01 MED ORDER — LISINOPRIL-HYDROCHLOROTHIAZIDE 20-25 MG PO TABS: 1.0000 | ORAL_TABLET | Freq: Every day | ORAL | Status: DC

## 2015-02-01 MED ORDER — HYDROCODONE-ACETAMINOPHEN 5-325 MG PO TABS: 1.0000 | ORAL_TABLET | Freq: Four times a day (QID) | ORAL | Status: DC | PRN

## 2015-02-01 MED ORDER — HYDROMORPHONE HCL 1 MG/ML IJ SOLN
0.5000 mg | INTRAMUSCULAR | Status: DC | PRN
  Administered 2015-02-04: 0.5 mg via INTRAVENOUS
  Filled 2015-02-01: qty 1

## 2015-02-01 MED ORDER — BUPIVACAINE HCL (PF) 0.25 % IJ SOLN
INTRAMUSCULAR | Status: DC | PRN
  Administered 2015-02-01: 10 mL

## 2015-02-01 MED ORDER — EPHEDRINE SULFATE 50 MG/ML IJ SOLN
INTRAMUSCULAR | Status: AC
  Filled 2015-02-01: qty 1

## 2015-02-01 MED ORDER — SUFENTANIL CITRATE 50 MCG/ML IV SOLN
INTRAVENOUS | Status: AC
  Filled 2015-02-01: qty 1

## 2015-02-01 MED ORDER — LIDOCAINE HCL (CARDIAC) 20 MG/ML IV SOLN
INTRAVENOUS | Status: AC
  Filled 2015-02-01: qty 5

## 2015-02-01 MED ORDER — BACITRACIN ZINC 500 UNIT/GM EX OINT
TOPICAL_OINTMENT | Freq: Three times a day (TID) | CUTANEOUS | Status: DC
  Administered 2015-02-01 – 2015-02-02 (×4): via TOPICAL
  Filled 2015-02-01: qty 28.35

## 2015-02-01 MED ORDER — AMLODIPINE BESYLATE 5 MG PO TABS
5.0000 mg | ORAL_TABLET | Freq: Every day | ORAL | Status: DC
  Administered 2015-02-02 – 2015-02-05 (×4): 5 mg via ORAL
  Filled 2015-02-01 (×4): qty 1

## 2015-02-01 MED ORDER — SODIUM CHLORIDE 0.9 % IJ SOLN
INTRAMUSCULAR | Status: DC | PRN
  Administered 2015-02-01: 20 mL

## 2015-02-01 MED ORDER — LACTATED RINGERS IV SOLN
INTRAVENOUS | Status: DC | PRN
  Administered 2015-02-01 (×3): via INTRAVENOUS

## 2015-02-01 MED ORDER — PROPOFOL 10 MG/ML IV BOLUS
INTRAVENOUS | Status: DC | PRN
  Administered 2015-02-01: 150 mg via INTRAVENOUS

## 2015-02-01 MED ORDER — SODIUM CHLORIDE 0.9 % IJ SOLN
INTRAMUSCULAR | Status: AC
  Filled 2015-02-01: qty 20

## 2015-02-01 MED ORDER — MANNITOL 25 % IV SOLN
25.0000 g | Freq: Once | INTRAVENOUS | Status: DC
  Filled 2015-02-01: qty 100

## 2015-02-01 MED ORDER — ACETAMINOPHEN 500 MG PO TABS
1000.0000 mg | ORAL_TABLET | Freq: Four times a day (QID) | ORAL | Status: AC
  Administered 2015-02-01 – 2015-02-02 (×4): 1000 mg via ORAL
  Filled 2015-02-01 (×9): qty 2

## 2015-02-01 MED ORDER — DIPHENHYDRAMINE HCL 12.5 MG/5ML PO ELIX
12.5000 mg | ORAL_SOLUTION | Freq: Four times a day (QID) | ORAL | Status: DC | PRN
  Administered 2015-02-02 – 2015-02-05 (×5): 12.5 mg via ORAL
  Filled 2015-02-01 (×6): qty 5

## 2015-02-01 MED ORDER — 0.9 % SODIUM CHLORIDE (POUR BTL) OPTIME: TOPICAL | Status: DC | PRN

## 2015-02-01 MED ORDER — BUPIVACAINE LIPOSOME 1.3 % IJ SUSP
INTRAMUSCULAR | Status: DC | PRN
  Administered 2015-02-01: 16 mL

## 2015-02-01 MED ORDER — ONDANSETRON HCL 4 MG/2ML IJ SOLN
INTRAMUSCULAR | Status: AC
  Filled 2015-02-01: qty 2

## 2015-02-01 MED ORDER — SUFENTANIL CITRATE 50 MCG/ML IV SOLN
INTRAVENOUS | Status: DC | PRN
  Administered 2015-02-01 (×2): 10 ug via INTRAVENOUS
  Administered 2015-02-01: 20 ug via INTRAVENOUS
  Administered 2015-02-01: 10 ug via INTRAVENOUS

## 2015-02-01 MED ORDER — PROPOFOL 10 MG/ML IV BOLUS
INTRAVENOUS | Status: AC
  Filled 2015-02-01: qty 20

## 2015-02-01 MED ORDER — SUGAMMADEX SODIUM 200 MG/2ML IV SOLN
INTRAVENOUS | Status: DC | PRN
  Administered 2015-02-01: 375 mg via INTRAVENOUS

## 2015-02-01 MED ORDER — STERILE WATER FOR IRRIGATION IR SOLN
Status: DC | PRN
  Administered 2015-02-01: 1000 mL

## 2015-02-01 MED ORDER — LACTATED RINGERS IR SOLN
Status: DC | PRN
  Administered 2015-02-01: 1000 mL

## 2015-02-01 MED ORDER — OXYCODONE HCL 5 MG PO TABS
5.0000 mg | ORAL_TABLET | ORAL | Status: DC | PRN
Start: 2015-02-01 — End: 2015-02-02
  Administered 2015-02-01 – 2015-02-02 (×4): 5 mg via ORAL
  Filled 2015-02-01 (×4): qty 1

## 2015-02-01 MED ORDER — BUPIVACAINE HCL (PF) 0.25 % IJ SOLN
INTRAMUSCULAR | Status: AC
  Filled 2015-02-01: qty 30

## 2015-02-01 MED ORDER — DEXAMETHASONE SODIUM PHOSPHATE 10 MG/ML IJ SOLN
INTRAMUSCULAR | Status: AC
  Filled 2015-02-01: qty 1

## 2015-02-01 MED ORDER — EPHEDRINE SULFATE 50 MG/ML IJ SOLN
INTRAMUSCULAR | Status: DC | PRN
  Administered 2015-02-01: 10 mg via INTRAVENOUS
  Administered 2015-02-01 (×2): 5 mg via INTRAVENOUS
  Administered 2015-02-01: 10 mg via INTRAVENOUS

## 2015-02-01 MED ORDER — CIPROFLOXACIN IN D5W 400 MG/200ML IV SOLN
400.0000 mg | INTRAVENOUS | Status: AC
  Administered 2015-02-01: 400 mg via INTRAVENOUS

## 2015-02-01 SURGICAL SUPPLY — 57 items
APPLICATOR SURGIFLO ENDO (HEMOSTASIS) ×2 IMPLANT
CHLORAPREP W/TINT 26ML (MISCELLANEOUS) ×2 IMPLANT
CLIP LIGATING HEM O LOK PURPLE (MISCELLANEOUS) ×4 IMPLANT
CLIP LIGATING HEMO LOK XL GOLD (MISCELLANEOUS) IMPLANT
CLIP LIGATING HEMO O LOK GREEN (MISCELLANEOUS) ×4 IMPLANT
COVER SURGICAL LIGHT HANDLE (MISCELLANEOUS) ×2 IMPLANT
COVER TIP SHEARS 8 DVNC (MISCELLANEOUS) ×1 IMPLANT
COVER TIP SHEARS 8MM DA VINCI (MISCELLANEOUS) ×1
DECANTER SPIKE VIAL GLASS SM (MISCELLANEOUS) ×2 IMPLANT
DRAIN CHANNEL 15F RND FF 3/16 (WOUND CARE) ×2 IMPLANT
DRAPE INCISE IOBAN 66X45 STRL (DRAPES) ×2 IMPLANT
DRAPE LAPAROSCOPIC ABDOMINAL (DRAPES) ×2 IMPLANT
DRAPE LG THREE QUARTER DISP (DRAPES) ×2 IMPLANT
DRAPE SHEET LG 3/4 BI-LAMINATE (DRAPES) ×2 IMPLANT
ELECT PENCIL ROCKER SW 15FT (MISCELLANEOUS) ×2 IMPLANT
ELECT REM PT RETURN 9FT ADLT (ELECTROSURGICAL) ×4
ELECTRODE REM PT RTRN 9FT ADLT (ELECTROSURGICAL) ×2 IMPLANT
EVACUATOR SILICONE 100CC (DRAIN) ×2 IMPLANT
GLOVE BIO SURGEON STRL SZ 6.5 (GLOVE) IMPLANT
GLOVE BIOGEL M STRL SZ7.5 (GLOVE) ×12 IMPLANT
GOWN STRL REUS W/TWL LRG LVL3 (GOWN DISPOSABLE) ×6 IMPLANT
GOWN STRL REUS W/TWL XL LVL3 (GOWN DISPOSABLE) ×2 IMPLANT
HEMOSTAT SURGICEL 4X8 (HEMOSTASIS) ×2 IMPLANT
KIT BASIN OR (CUSTOM PROCEDURE TRAY) ×2 IMPLANT
LIQUID BAND (GAUZE/BANDAGES/DRESSINGS) ×2 IMPLANT
LOOP VESSEL MAXI BLUE (MISCELLANEOUS) ×2 IMPLANT
NEEDLE INSUFFLATION 14GA 120MM (NEEDLE) ×2 IMPLANT
NS IRRIG 1000ML POUR BTL (IV SOLUTION) ×2 IMPLANT
POSITIONER SURGICAL ARM (MISCELLANEOUS) ×4 IMPLANT
POUCH ENDO CATCH II 15MM (MISCELLANEOUS) ×2 IMPLANT
POUCH SPECIMEN RETRIEVAL 10MM (ENDOMECHANICALS) ×2 IMPLANT
RELOAD STAPLER WHITE 60MM (STAPLE) ×4 IMPLANT
SEAL CANN UNIV 5-8 DVNC XI (MISCELLANEOUS) ×1 IMPLANT
SEAL XI 5MM-8MM UNIVERSAL (MISCELLANEOUS) ×1
SET TUBE IRRIG SUCTION NO TIP (IRRIGATION / IRRIGATOR) IMPLANT
SOLUTION ELECTROLUBE (MISCELLANEOUS) ×2 IMPLANT
SPONGE LAP 4X18 X RAY DECT (DISPOSABLE) ×2 IMPLANT
STAPLE ECHEON FLEX 60 POW ENDO (STAPLE) ×2 IMPLANT
STAPLER RELOAD WHITE 60MM (STAPLE) ×8
SURGIFLO W/THROMBIN 8M KIT (HEMOSTASIS) ×4 IMPLANT
SUT ETHILON 3 0 PS 1 (SUTURE) ×2 IMPLANT
SUT MNCRL AB 4-0 PS2 18 (SUTURE) ×4 IMPLANT
SUT MON AB 5-0 RB1 27 (SUTURE) ×2 IMPLANT
SUT PDS AB 1 CT1 27 (SUTURE) ×2 IMPLANT
SUT V-LOC BARB 180 2/0GR6 GS22 (SUTURE) ×2
SUT V-LOC BARB 180 2/0GR9 GS23 (SUTURE)
SUT VLOC BARB 180 ABS3/0GR12 (SUTURE) ×2
SUTURE V-LC BRB 180 2/0GR6GS22 (SUTURE) ×1 IMPLANT
SUTURE V-LC BRB 180 2/0GR9GS23 (SUTURE) IMPLANT
SUTURE VLOC BRB 180 ABS3/0GR12 (SUTURE) ×1 IMPLANT
TOWEL OR 17X26 10 PK STRL BLUE (TOWEL DISPOSABLE) ×4 IMPLANT
TOWEL OR NON WOVEN STRL DISP B (DISPOSABLE) ×2 IMPLANT
TRAY FOLEY W/METER SILVER 16FR (SET/KITS/TRAYS/PACK) ×2 IMPLANT
TRAY LAPAROSCOPIC (CUSTOM PROCEDURE TRAY) ×2 IMPLANT
TROCAR BLADELESS OPT 5 100 (ENDOMECHANICALS) ×2 IMPLANT
TROCAR XCEL 12X100 BLDLESS (ENDOMECHANICALS) ×4 IMPLANT
WATER STERILE IRR 1500ML POUR (IV SOLUTION) ×2 IMPLANT

## 2015-02-01 NOTE — Anesthesia Procedure Notes (Addendum)
Procedure Name: Intubation Date/Time: 02/01/2015 7:47 AM Performed by: Danley Danker L Patient Re-evaluated:Patient Re-evaluated prior to inductionOxygen Delivery Method: Circle system utilized Preoxygenation: Pre-oxygenation with 100% oxygen Intubation Type: IV induction Ventilation: Mask ventilation without difficulty and Oral airway inserted - appropriate to patient size Laryngoscope Size: Miller and 3 Tube size: 8.0 mm Number of attempts: 1 Airway Equipment and Method: Stylet Placement Confirmation: ETT inserted through vocal cords under direct vision,  positive ETCO2 and breath sounds checked- equal and bilateral Secured at: 22 cm Tube secured with: Tape Dental Injury: Teeth and Oropharynx as per pre-operative assessment

## 2015-02-01 NOTE — Op Note (Signed)
Preoperative diagnosis:  1. Right renal mass   Postoperative diagnosis:  1. Same   Procedure: 1. Robotic assisted laparoscopic right radical nephrectomy  Surgeon: Ardis Hughs, MD First Asst.: Debbrah Alar, PA-C Resident assistant: Dr. Gerre Couch, MD  Anesthesia: General  Complications: None  Intraoperative findings: Initially, I plan to do a partial nephrectomy. Once the hilum and been dissected and we isolated the mass, it became apparent that there was no plane between the mass and the collecting system so that it could be safely partial. At this point, decision was made to proceed with a radical nephrectomy.  EBL: 500cc  Specimens: Right kidney and proximal ureter  Indication: [name] is a 69 y.o. patient with right endophytic enhancing renal mass.  After reviewing the management options for treatment, he elected to proceed with the above surgical procedure(s). We have discussed the potential benefits and risks of the procedure, side effects of the proposed treatment, the likelihood of the patient achieving the goals of the procedure, and any potential problems that might occur during the procedure or recuperation. Informed consent has been obtained.  Description of procedure:  The patient was taken to the operating room and general anesthesia was induced.  He was then placed in the left lateral decubitus position, right side up. He was placed on a beanbag with a foam pad and the table was flexed opening up the right costovertebral angle. The beanbag was then inflated and all bony prominences padded. His right arm was then secured to the armboard. He was then prepped and draped in the routine standard sterile fashion. At this point a timeout was performed with confirmation of laterality and antibiotic administration. A Foley catheter was placed prior to the patient being prepped and draped.  An incision was then made medial to the 12th rib superior to the umbilicus and  slightly lateral. I cut down using electrocautery until the rectus fascia which we then incised and then proceeded into the peritoneum under visual guidance. An 8 mm trocar was then gently passed through the incision and abdomen insufflated. We then placed 2 additional 8 mm ports inferior to the initial port placement under visual guidance. This point we needed to lyse adhesions on the anterior abdominal wall from the omentum prior to placing the fourth 8 mm trocar superior to the initial port. We then placed a 12 mm port in the midline just superior to the umbilicus for the assistant. Finally, we placed a 5 mm port in the subcostal margin which we used as a liver retractor.  We then started the dissection by mobilizing the right colon medially. We then were able to isolate the ureter and gonadal vein at the inferior aspect of the dissection and then lifted these to provide some superior traction on the kidney. He then walked the dissection superiorly up to the renal hilum. This was fairly difficult given the extent of perirenal fat but ultimately we were able to put vessel loops around both the anterior branch of the renal artery as well as the renal vein. We then proceeded to dissected through to his fascia onto the anterior aspect of the kidney and isolated the tumor. This was located using ultrasound. It was difficult to truly appreciate the mass and its borders. It appeared under ultrasound that the mass was adjacent to the collecting system without clear distinction between the 2. Given her difficulty in visualizing the exact boundaries of the mass and the extent of treatment is fat complicating her visualization  we opted to proceed with a radical nephrectomy. I was afraid that proceeded with a partial nephrectomy would lead to extensive urine leak and potentially excessive bleeding from the renal hilum.  At this point, we then further dissected out the renal hilum placed 2 large laparoscopic clips down on  the artery and one up and then ligated it. We did this for each renal artery branch. We used the endovascular stapler to transect the renal vein. We then were able to come around the kidney and ureters fascia sparing the adrenal gland. Once the posterior aspect of the kidney and lower pole we ligated the ureter using a large laparoscopic clip. Once the kidney was completely freed we then reevaluated the hilum noting adequate hemostasis. We did opt to place Surgicel flow and Surgicel over the hilum to be safe. We then removed the assistant port and placed a large Endo Catch bag through the incision. We're able to get the entire specimen into the Endo Catch bag and then brought the string out through the midline incision. This point all the robotic arms were disengaged the robot and backed with the patient. The ports were then removed under visual guidance.  I then extended the incision in the midline spear to the umbilicus approximately 5 cm down through the rectus fascia. The specimen was then removed intact. The fascia was then closed with a 20 looped PDS.  All skin incisions were then closed with a 4-0 Monocryl subcuticular.  The incisions were then injected with long-acting local anesthetic. The patient was subsequently extubated and returned to the PACU in stable condition.  Ardis Hughs, M.D.

## 2015-02-01 NOTE — H&P (Signed)
Reason For Visit Right renal mass   History of Present Illness 69 year old male who was found incidentally to have a right enhancing renal mass as part of a workup for left lower quadrant pain. The renal mass was seen from the CT scan. The patient was referred to Dr. Jeffie Pollock, M.D. for further evaluation. The patient has been counseled about the implications of enhancing renal mass, its likelihood for malignancy, and the treatment options. The patient is interested in surgical extirpation. He like to take care of this as soon as possible.  The patient denies any flank pain. He denies any gross hematuria. He has no family history of GU malignancy.  The patient does have a past medical history significant for lung cancer, he had a right lower lobectomy in 1993. He has had no sequela of this. In addition, the patient had a cholecystectomy more than 30 years ago, done in the open fashion. The patient has no history of coronary artery disease or diabetes. He does get winded after walking several flights of steps, although he is able to perform this without significant difficulty.   Past Medical History Problems  1. History of hypercholesterolemia (Z86.39) 2. History of hypertension (Z86.79) 3. History of malignant neoplasm of lung (R60.454)  Surgical History Problems  1. History of Cholecystectomy 2. History of Lung Surgery  Current Meds 1. Acetaminophen TABS;  Therapy: (Recorded:17Nov2016) to Recorded 2. AmLODIPine Besylate TABS;  Therapy: (Recorded:17Nov2016) to Recorded 3. Aspirin 81 MG TABS;  Therapy: (Recorded:17Nov2016) to Recorded 4. Atorvastatin Calcium 10 MG Oral Tablet;  Therapy: (Recorded:17Nov2016) to Recorded 5. Lisinopril-HCTZ 25-20 MG TABS;  Therapy: (Recorded:17Nov2016) to Recorded 6. Omeprazole 20 MG Oral Tablet Delayed Release;  Therapy: (Recorded:17Nov2016) to Recorded 7. TraZODone HCl - 150 MG Oral Tablet;  Therapy: (Recorded:17Nov2016) to  Recorded  Allergies Medication  1. No Known Drug Allergies  Family History Problems  1. Family history of myocardial infarction (Z82.49) : Father  Social History Problems  1. Denied: History of Alcohol use 2. Caffeine use (F15.90)   2 per day 3. Former smoker [phone])   smoked for 20 years, quit 30 years ago 49. Married 5. Number of children   2 children  Physical Exam Constitutional: Well nourished and well developed . No acute distress.  ENT:. The ears and nose are normal in appearance.  Neck: The appearance of the neck is normal and no neck mass is present.  Pulmonary: No respiratory distress and normal respiratory rhythm and effort.  Cardiovascular: Heart rate and rhythm are normal . No peripheral edema.  Abdomen: The abdomen is mildly obese. The abdomen is soft and nontender. No masses are palpated. No hernias are palpable. Right upper quadrant scar.  Rectal: The prostate exam was deferred.  Skin: Normal skin turgor, no visible rash and no visible skin lesions.  Neuro/Psych:. Mood and affect are appropriate.    Results/Data Urine [Data Includes: Last 1 Day]   [id number]  COLOR YELLOW   APPEARANCE CLEAR   SPECIFIC GRAVITY 1.025   pH 5.0   GLUCOSE NEGATIVE   BILIRUBIN NEGATIVE   KETONE NEGATIVE   BLOOD NEGATIVE   PROTEIN NEGATIVE   NITRITE NEGATIVE   LEUKOCYTE ESTERASE NEGATIVE    Normal urinalysis.  I have independently reviewed the patient's CT scan: The patient has a 3.6 cm enhancing mass on the anterior aspect of the interpolar region of the right kidney. The mass does abut the hilum. There is a single artery and vein. The artery does have an early branch, the  anterior branch appears to end into the mass. The mass is also adjacent to the collecting system.   Assessment Assessed  1. Neoplasm of right kidney (D49.511)  Patient has a right small enhancing renal mass   Plan Health Maintenance  1. UA With REFLEX; [Do Not Release]; Status:Resulted -  Requires Verification;   Done:  [id number] 03:33PM  Discussion/Summary We discussed the treatment options for a renal mass. I went over the natural history of such a enhancing renal mass. I discussed the operation with the patient cords in detail. I told him that he has a significantly higher chance of having a radical nephrectomy given the proximity of the mass to the renal hilum. It is also entirely possible that we were able to ligate anterior branch of the renal artery super selectively. I went over the robotic-assisted laparoscopic partial nephrectomy approach. I described for the patient the procedure in detail including port placement. I detailed the postoperative course including the fact that the patient would have both a drain and a Foley catheter following the surgery. I told the patient that most often patients are discharged on postoperative day one or 2. I then detailed the expected recovery time, I told the patient that he would not be able to lift anything greater than 20 pounds for 4 weeks. I also went over the risks and benefits of this operation in great detail. We discussed the risk of injury to surrounding structures, major blood vessels and nerves, bleeding, infection, loss of kidney, and the risk of recurrent cancer.

## 2015-02-01 NOTE — Transfer of Care (Signed)
Immediate Anesthesia Transfer of Care Note  Patient: [name]  Procedure(s) Performed: Procedure(s): RIGHT ROBOTIC ASSISTED LAPAROSOCOPY NEPHRECTOMY (Right)  Patient Location: PACU  Anesthesia Type:General  Level of Consciousness: awake, alert  and oriented  Airway & Oxygen Therapy: Patient Spontanous Breathing and Patient connected to face mask oxygen  Post-op Assessment: Report given to RN and Post -op Vital signs reviewed and stable  Post vital signs: Reviewed and stable  Last Vitals:  Filed Vitals:   02/01/15 0522  BP: 124/69  Pulse: 74  Temp: 36.5 C  Resp: 18    Complications: No apparent anesthesia complications

## 2015-02-01 NOTE — Progress Notes (Signed)
Day of Surgery   Subjective: Patient reports no complaints. No N/V, flatus/BMs. Pain controlled with PO pain medications. Has not ambulated. Did note a small amount of blood at the tip of the penis and bladder spasms.  Objective: Vital signs in last 24 hours: Temp:  [97.7 F (36.5 C)-99.3 F (37.4 C)] 98.1 F (36.7 C) (12/16 1401) Pulse Rate:  [74-108] 108 (12/16 1401) Resp:  [9-18] 9 (12/16 1330) BP: (110-135)/(66-71) 122/67 mmHg (12/16 1401) SpO2:  [95 %-100 %] 95 % (12/16 1401) Weight:  [94.348 kg (208 lb)] 94.348 kg (208 lb) (12/16 0540)  Intake/Output from previous day:   Intake/Output this shift: Total I/O In: 3070.8 [I.V.:3070.8] Out: 845 [Urine:345; Blood:500]  Physical Exam:  General:alert, cooperative and appears stated age GI: soft, appropriately TTP, incisions C/D/I  Male genitalia: Foley with clear yellow urine in bag. Small amount of dried blood at meatus. Extremities: extremities normal, atraumatic, no cyanosis or edema  Lab Results:  Recent Labs  02/01/15 1349  HGB 12.4*  HCT 34.9*   BMET No results for input(s): NA, K, CL, CO2, GLUCOSE, BUN, CREATININE, CALCIUM in the last 72 hours. No results for input(s): LABPT, INR in the last 72 hours. No results for input(s): LABURIN in the last 72 hours. Results for orders placed or performed during the hospital encounter of 01/29/15  Urine culture     Status: None   Collection Time: 01/29/15  9:35 AM  Result Value Ref Range Status   Specimen Description URINE, CLEAN CATCH  Final   Special Requests NONE  Final   Culture   Final    1,000 COLONIES/mL INSIGNIFICANT GROWTH Performed at [facility]    Report Status 01/30/2015 FINAL  Final    Studies/Results: No results found.  Assessment/Plan: Day of Surgery Procedure(s) (LRB): RIGHT ROBOTIC ASSISTED LAPAROSOCOPY NEPHRECTOMY (Right)  69 yo M POD#0 right robotic partial converted to radical nephrectomy. Recovering  well.   Clears  mIVF  Ambulate 1x tonight  D/C foley in AM  AM labs  Wean O2  IS  Bacitracin to meatus  Likely D/C tomorrow PM   LOS: 0 days   Acie Fredrickson 02/01/2015, 5:43 PM

## 2015-02-01 NOTE — Discharge Instructions (Signed)

## 2015-02-02 LAB — BASIC METABOLIC PANEL
Anion gap: 8 (ref 5–15)
BUN: 22 mg/dL — AB (ref 6–20)
CALCIUM: 8.4 mg/dL — AB (ref 8.9–10.3)
CO2: 29 mmol/L (ref 22–32)
CREATININE: 1.86 mg/dL — AB (ref 0.61–1.24)
Chloride: 100 mmol/L — ABNORMAL LOW (ref 101–111)
GFR calc Af Amer: 40 mL/min — ABNORMAL LOW (ref >60)
GFR, EST NON AFRICAN AMERICAN: 35 mL/min — AB (ref >60)
Glucose, Bld: 172 mg/dL — ABNORMAL HIGH (ref 65–99)
Potassium: 4.2 mmol/L (ref 3.5–5.1)
SODIUM: 137 mmol/L (ref 135–145)

## 2015-02-02 LAB — HEMOGLOBIN AND HEMATOCRIT, BLOOD
HCT: 32.6 % — ABNORMAL LOW (ref 39.0–52.0)
HEMOGLOBIN: 11.2 g/dL — AB (ref 13.0–17.0)

## 2015-02-02 MED ORDER — BISACODYL 10 MG RE SUPP
10.0000 mg | Freq: Once | RECTAL | Status: AC
  Administered 2015-02-02: 10 mg via RECTAL
  Filled 2015-02-02: qty 1

## 2015-02-02 MED ORDER — OXYCODONE HCL 5 MG PO TABS
5.0000 mg | ORAL_TABLET | ORAL | Status: DC | PRN
  Administered 2015-02-02 – 2015-02-03 (×2): 10 mg via ORAL
  Administered 2015-02-03 – 2015-02-04 (×3): 5 mg via ORAL
  Administered 2015-02-04 – 2015-02-05 (×4): 10 mg via ORAL
  Filled 2015-02-02: qty 1
  Filled 2015-02-02 (×2): qty 2
  Filled 2015-02-02: qty 1
  Filled 2015-02-02 (×3): qty 2
  Filled 2015-02-02: qty 1
  Filled 2015-02-02: qty 2

## 2015-02-02 NOTE — Progress Notes (Signed)
1 Day Post-Op   Subjective: Patient reports no complaints. No N/V, flatus/BMs. Pain controlled with PO pain medications. Ambulating in halls. Tolerated clears.    Objective: Vital signs in last 24 hours: Temp:  [98.1 F (36.7 C)-99.3 F (37.4 C)] 98.8 F (37.1 C) (12/17 0542) Pulse Rate:  [72-108] 72 (12/17 0542) Resp:  [9-20] 20 (12/17 0542) BP: (110-137)/(57-71) 111/57 mmHg (12/17 0542) SpO2:  [90 %-100 %] 92 % (12/17 0542)  Intake/Output from previous day: 12/16 0701 - 12/17 0700 In: 3641.7 [I.V.:3641.7] Out: 2665 [Urine:2165; Blood:500] Intake/Output this shift:    Physical Exam:  General:alert, cooperative and appears stated age GI: soft, appropriately TTP, incisions C/D/I  Male genitalia: Foley with clear yellow urine in bag.  Extremities: extremities normal, atraumatic, no cyanosis or edema  Lab Results:  Recent Labs  02/01/15 1349 02/02/15 0425  HGB 12.4* 11.2*  HCT 34.9* 32.6*   BMET  Recent Labs  02/02/15 0425  NA 137  K 4.2  CL 100*  CO2 29  GLUCOSE 172*  BUN 22*  CREATININE 1.86*  CALCIUM 8.4*   No results for input(s): LABPT, INR in the last 72 hours. No results for input(s): LABURIN in the last 72 hours. Results for orders placed or performed during the hospital encounter of 01/29/15  Urine culture     Status: None   Collection Time: 01/29/15  9:35 AM  Result Value Ref Range Status   Specimen Description URINE, CLEAN CATCH  Final   Special Requests NONE  Final   Culture   Final    1,000 COLONIES/mL INSIGNIFICANT GROWTH Performed at [facility]    Report Status 01/30/2015 FINAL  Final    Studies/Results: No results found.  Assessment/Plan: 1 Day Post-Op Procedure(s) (LRB): RIGHT ROBOTIC ASSISTED LAPAROSOCOPY NEPHRECTOMY (Right)  69 yo M POD#1 right robotic partial converted to radical nephrectomy. Recovering well.   Heart healthy diet  [place]  Ambulate, IS  D/C foley   F/u TOV  AM labs  Bisacodyl  suppository  Bacitracin to meatus  D/C tomorrow AM   LOS: 1 day   Acie Fredrickson 02/02/2015, 9:53 AM

## 2015-02-02 NOTE — Discharge Summary (Signed)
Physician Discharge Summary  Patient ID: [name] MRN: [id number] DOB/AGE: December 12, 1945 69 y.o.  Admit date: 02/01/2015 Discharge date: 02/04/2015  Admission Diagnoses: renal mass  Discharge Diagnoses:  Active Problems:   Renal mass   Discharged Condition: good  Hospital Course:  69 yo male who is s/p right robotic assisted laparoscopic radical nephrectomy with Dr. Louis Meckel. He did well post-operatively. He had a somewhat delayed return of bowel function, however, he had a large bowel movement on the afternoon of POD#2 and started passing gas with less abdominal distension. His diet was slowly advanced and at the time of discharge he was tolerating a regular diet, ambulating at his baseline, was voiding spontaneously after foley catheter removal, and pain was well controlled with oral narcotics. He was discharged to home on POD#3.  Consults: None  Significant Diagnostic Studies: labs: Cr 2.1  Treatments: surgery: right robotic assisted laparoscopic radical nephrectomy  Discharge Exam: Blood pressure 135/68, pulse 99, temperature 99.4 F (37.4 C), temperature source Oral, resp. rate 17, height '5\' 9"'$  (1.753 m), weight 94.348 kg (208 lb), SpO2 92 %. General:alert, cooperative and appears stated age GI: Abdomen softer, mildly distended, appropriately TTP, incisions C/D/I  Male genitalia: Foley out  Extremities: extremities normal, atraumatic, no cyanosis or edema  Disposition: 01-Home or Self Care      Discharge Instructions    Discharge patient    Complete by:  As directed             Medication List    STOP taking these medications        aspirin EC 81 MG tablet      TAKE these medications        acetaminophen 500 MG tablet  Commonly known as:  TYLENOL  Take 1,000 mg by mouth 2 (two) times daily as needed (pain).     amLODipine 5 MG tablet  Commonly known as:  NORVASC  Take 5 mg by mouth daily.     atorvastatin 10 MG tablet  Commonly known as:  LIPITOR   Take 10 mg by mouth every other day.     HYDROcodone-acetaminophen 5-325 MG tablet  Commonly known as:  NORCO  Take 1-2 tablets by mouth every 6 (six) hours as needed.     lisinopril-hydrochlorothiazide 20-25 MG tablet  Commonly known as:  PRINZIDE,ZESTORETIC  Take 1 tablet by mouth daily.     omeprazole 20 MG capsule  Commonly known as:  PRILOSEC  Take 20 mg by mouth daily before breakfast.     ranitidine 150 MG capsule  Commonly known as:  ZANTAC  Take 150 mg by mouth daily.     traZODone 150 MG tablet  Commonly known as:  DESYREL  Take 150 mg by mouth at bedtime.       Follow-up Information    Follow up with Ardis Hughs, MD On 02/15/2015.   Specialty:  Urology   Why:  1:30pm   Contact information:   [place] [id number] [phone]       Signed: Ardis Hughs 02/04/2015, 5:34 PM

## 2015-02-02 NOTE — Anesthesia Postprocedure Evaluation (Signed)
Anesthesia Post Note  Patient: [name]  Procedure(s) Performed: Procedure(s) (LRB): RIGHT ROBOTIC ASSISTED LAPAROSOCOPY NEPHRECTOMY (Right)  Patient location during evaluation: PACU Anesthesia Type: General Level of consciousness: awake and alert Pain management: pain level controlled Vital Signs Assessment: post-procedure vital signs reviewed and stable Respiratory status: spontaneous breathing, nonlabored ventilation, respiratory function stable and patient connected to nasal cannula oxygen Cardiovascular status: blood pressure returned to baseline and stable Postop Assessment: no signs of nausea or vomiting Anesthetic complications: no    Last Vitals:  Filed Vitals:   02/02/15 0215 02/02/15 0542  BP: 121/60 111/57  Pulse: 88 72  Temp: 36.8 C 37.1 C  Resp: 20 20    Last Pain:  Filed Vitals:   02/02/15 0835  PainSc: 7                  Corri Delapaz J

## 2015-02-03 LAB — HEMOGLOBIN AND HEMATOCRIT, BLOOD
HEMATOCRIT: 32.2 % — AB (ref 39.0–52.0)
Hemoglobin: 11.1 g/dL — ABNORMAL LOW (ref 13.0–17.0)

## 2015-02-03 LAB — BASIC METABOLIC PANEL WITH GFR
Anion gap: 7 (ref 5–15)
BUN: 25 mg/dL — ABNORMAL HIGH (ref 6–20)
CO2: 31 mmol/L (ref 22–32)
Calcium: 8.7 mg/dL — ABNORMAL LOW (ref 8.9–10.3)
Chloride: 99 mmol/L — ABNORMAL LOW (ref 101–111)
Creatinine, Ser: 2.1 mg/dL — ABNORMAL HIGH (ref 0.61–1.24)
GFR calc Af Amer: 35 mL/min — ABNORMAL LOW
GFR calc non Af Amer: 30 mL/min — ABNORMAL LOW
Glucose, Bld: 116 mg/dL — ABNORMAL HIGH (ref 65–99)
Potassium: 4.1 mmol/L (ref 3.5–5.1)
Sodium: 137 mmol/L (ref 135–145)

## 2015-02-03 MED ORDER — DEXTROSE IN LACTATED RINGERS 5 % IV SOLN
INTRAVENOUS | Status: DC
  Administered 2015-02-03 – 2015-02-04 (×2): via INTRAVENOUS

## 2015-02-03 MED ORDER — HEPARIN SODIUM (PORCINE) 5000 UNIT/ML IJ SOLN
5000.0000 [IU] | Freq: Three times a day (TID) | INTRAMUSCULAR | Status: DC
  Administered 2015-02-03 – 2015-02-05 (×5): 5000 [IU] via SUBCUTANEOUS
  Filled 2015-02-03 (×7): qty 1

## 2015-02-03 MED ORDER — BISACODYL 10 MG RE SUPP
10.0000 mg | Freq: Once | RECTAL | Status: AC
  Administered 2015-02-03: 10 mg via RECTAL
  Filled 2015-02-03: qty 1

## 2015-02-03 MED ORDER — ACETAMINOPHEN 325 MG PO TABS
650.0000 mg | ORAL_TABLET | ORAL | Status: DC | PRN
  Administered 2015-02-03: 650 mg via ORAL
  Filled 2015-02-03: qty 2

## 2015-02-03 MED ORDER — SIMETHICONE 80 MG PO CHEW
80.0000 mg | CHEWABLE_TABLET | Freq: Four times a day (QID) | ORAL | Status: DC | PRN
  Administered 2015-02-03 (×2): 80 mg via ORAL
  Filled 2015-02-03 (×4): qty 1

## 2015-02-03 MED ORDER — BISACODYL 10 MG RE SUPP
10.0000 mg | Freq: Two times a day (BID) | RECTAL | Status: DC
  Filled 2015-02-03: qty 1

## 2015-02-03 NOTE — Progress Notes (Signed)
2 Days Post-Op   Subjective: Patient reports abdominal distension, gas pain and no appetite. Reports mild nausea, no vomiting. Passed a small amount of flatus and had two small BMs yesterday after suppository. Pain controlled with PO pain medications. Ambulating in halls. Tolerated regular diet yesterday but does not want breakfast.  Foley out & voiding spontaneously without difficulty.  Objective: Vital signs in last 24 hours: Temp:  [98 F (36.7 C)-99.8 F (37.7 C)] 99.8 F (37.7 C) (12/18 0628) Pulse Rate:  [75-97] 88 (12/18 0845) Resp:  [18-20] 18 (12/18 0628) BP: (119-150)/(58-80) 120/80 mmHg (12/18 0845) SpO2:  [93 %-100 %] 100 % (12/18 0850)  Intake/Output from previous day: 12/17 0701 - 12/18 0700 In: 71 [P.O.:840] Out: 2276 [Urine:2275; Stool:1] Intake/Output this shift:    Physical Exam:  General:alert, cooperative and appears stated age GI: Moderately firm and distended, appropriately TTP, incisions C/D/I  Male genitalia: Foley out  Extremities: extremities normal, atraumatic, no cyanosis or edema  Lab Results:  Recent Labs  02/01/15 1349 02/02/15 0425  HGB 12.4* 11.2*  HCT 34.9* 32.6*   BMET  Recent Labs  02/02/15 0425  NA 137  K 4.2  CL 100*  CO2 29  GLUCOSE 172*  BUN 22*  CREATININE 1.86*  CALCIUM 8.4*   No results for input(s): LABPT, INR in the last 72 hours. No results for input(s): LABURIN in the last 72 hours. Results for orders placed or performed during the hospital encounter of 01/29/15  Urine culture     Status: None   Collection Time: 01/29/15  9:35 AM  Result Value Ref Range Status   Specimen Description URINE, CLEAN CATCH  Final   Special Requests NONE  Final   Culture   Final    1,000 COLONIES/mL INSIGNIFICANT GROWTH Performed at [facility]    Report Status 01/30/2015 FINAL  Final    Studies/Results: No results found.  Assessment/Plan: 2 Days Post-Op Procedure(s) (LRB): RIGHT ROBOTIC ASSISTED LAPAROSOCOPY  NEPHRECTOMY (Right)  69 yo M POD#2 right robotic partial converted to radical nephrectomy. Evidence of developing post-op ileus. Creatinine continues to rise to 2.1 today (baseline 1.1)   Continue Heart healthy diet and let patient self-regulate   Restart mIVF at 125 given poor PO intake and bump in creatinine.  Ambulate, IS  AM labs  Bisacodyl suppository  Bacitracin to meatus  Start prophylactic [place] heparin  SCDs at all times  Awaiting return of bowel function  Re-assess for discharge tomorrow if opens up and passing flatus +/- BMs with improved appetite and abdominal distension.   LOS: 2 days   Acie Fredrickson 02/03/2015, 10:04 AM

## 2015-02-04 LAB — BASIC METABOLIC PANEL
ANION GAP: 8 (ref 5–15)
BUN: 27 mg/dL — ABNORMAL HIGH (ref 6–20)
CHLORIDE: 97 mmol/L — AB (ref 101–111)
CO2: 32 mmol/L (ref 22–32)
CREATININE: 2.13 mg/dL — AB (ref 0.61–1.24)
Calcium: 8.5 mg/dL — ABNORMAL LOW (ref 8.9–10.3)
GFR calc non Af Amer: 30 mL/min — ABNORMAL LOW (ref >60)
GFR, EST AFRICAN AMERICAN: 34 mL/min — AB (ref >60)
Glucose, Bld: 137 mg/dL — ABNORMAL HIGH (ref 65–99)
Potassium: 4 mmol/L (ref 3.5–5.1)
Sodium: 137 mmol/L (ref 135–145)

## 2015-02-04 LAB — HEMOGLOBIN AND HEMATOCRIT, BLOOD
HEMATOCRIT: 34.7 % — AB (ref 39.0–52.0)
HEMOGLOBIN: 11.5 g/dL — AB (ref 13.0–17.0)

## 2015-02-04 MED ORDER — ONDANSETRON 4 MG PO TBDP
4.0000 mg | ORAL_TABLET | Freq: Once | ORAL | Status: AC
  Administered 2015-02-04: 4 mg via ORAL
  Filled 2015-02-04: qty 1

## 2015-02-04 MED ORDER — ACETAMINOPHEN 10 MG/ML IV SOLN
1000.0000 mg | Freq: Once | INTRAVENOUS | Status: AC
  Administered 2015-02-04: 1000 mg via INTRAVENOUS
  Filled 2015-02-04: qty 100

## 2015-02-04 NOTE — Care Management Important Message (Signed)
Important Message  Patient Details  Name: Daeveon Zweber MRN: [id number] Date of Birth: [birth year]   Medicare Important Message Given:  Yes    Camillo Flaming 02/04/2015, 11:37 AMImportant Message  Patient Details  Name: Corky Blumstein MRN: [id number] Date of Birth: 03-04-1945   Medicare Important Message Given:  Yes    Camillo Flaming 02/04/2015, 11:37 AM

## 2015-02-04 NOTE — Progress Notes (Signed)
3 Days Post-Op   Subjective: Continues to pass gas today and denies significant abdominal distension. No additional fevers today.  Objective: Vital signs in last 24 hours: Temp:  [98.6 F (37 C)-101.2 F (38.4 C)] 99.4 F (37.4 C) (12/19 1311) Pulse Rate:  [75-99] 99 (12/19 1311) Resp:  [17-18] 17 (12/19 1311) BP: (135-147)/(53-83) 135/68 mmHg (12/19 1311) SpO2:  [91 %-92 %] 92 % (12/19 1311)  Intake/Output from previous day: 12/18 0701 - 12/19 0700 In: 656.7 [P.O.:240; I.V.:416.7] Out: 900 [Urine:900] Intake/Output this shift: Total I/O In: 1240 [P.O.:240; I.V.:1000] Out: 600 [Urine:600]  Physical Exam:  General:alert, cooperative and appears stated age GI: Abdomen softer, mildly distended, appropriately TTP, incisions C/D/I  Male genitalia: Foley out  Extremities: extremities normal, atraumatic, no cyanosis or edema  Lab Results:  Recent Labs  02/02/15 0425 02/03/15 1005 02/04/15 0458  HGB 11.2* 11.1* 11.5*  HCT 32.6* 32.2* 34.7*   BMET  Recent Labs  02/03/15 1005 02/04/15 0458  NA 137 137  K 4.1 4.0  CL 99* 97*  CO2 31 32  GLUCOSE 116* 137*  BUN 25* 27*  CREATININE 2.10* 2.13*  CALCIUM 8.7* 8.5*   No results for input(s): LABPT, INR in the last 72 hours. No results for input(s): LABURIN in the last 72 hours. Results for orders placed or performed during the hospital encounter of 01/29/15  Urine culture     Status: None   Collection Time: 01/29/15  9:35 AM  Result Value Ref Range Status   Specimen Description URINE, CLEAN CATCH  Final   Special Requests NONE  Final   Culture   Final    1,000 COLONIES/mL INSIGNIFICANT GROWTH Performed at [facility]    Report Status 01/30/2015 FINAL  Final    Studies/Results: No results found.  Assessment/Plan: 3 Days Post-Op Procedure(s) (LRB): RIGHT ROBOTIC ASSISTED LAPAROSOCOPY NEPHRECTOMY (Right)  69 yo M POD#3 right robotic partial converted to radical nephrectomy. Creatinine plateaued today  at 2.1 (baseline 1.1)   If has another fever will proceed with CXR, UCx, BCx x 2 & CBC  Continue Heart healthy diet and let patient self-regulate   Medlock  Encourage IS  Ambulate  Heating pad PRN back pain, OOB to chair  Continue prophylactic [place] heparin  SCDs at all times  Possible D/C later this afternoon    LOS: 3 days   Acie Fredrickson 02/04/2015, 4:25 PM

## 2015-02-04 NOTE — Care Management Note (Signed)
Case Management Note  Patient Details  Name: Wren Gallaga MRN: [id number] Date of Birth: 09-13-1945  Subjective/Objective:     69 y/o m admitted w/Renal mass. S/p radical nephrectomy. From home.               Action/Plan:d/c plan home.   Expected Discharge Date:                  Expected Discharge Plan:  Home/Self Care  In-House Referral:     Discharge planning Services  CM Consult  Post Acute Care Choice:    Choice offered to:     DME Arranged:    DME Agency:     HH Arranged:    HH Agency:     Status of Service:  In process, will continue to follow  Medicare Important Message Given:  Yes Date Medicare IM Given:    Medicare IM give by:    Date Additional Medicare IM Given:    Additional Medicare Important Message give by:     If discussed at [place] of Stay Meetings, dates discussed:    Additional Comments:  Dessa Phi, RN 02/04/2015, 2:00 PM

## 2015-02-04 NOTE — Progress Notes (Signed)
Patient ID: [name], male   DOB: 1945-07-15, 69 y.o.   MRN: [id number]  [name] began to have some nausea and sweats this evening prior to being discharged.  He has had 2 BM's.   BP 135/68 mmHg  Pulse 99  Temp(Src) 98.4 F (36.9 C) (Oral)  Resp 17  Ht '5\' 9"'$  (1.753 m)  Wt 94.348 kg (208 lb)  BMI 30.70 kg/m2  SpO2 92%  Gen: He appears uncomfortable but in NAD. GI: distended with +BS.  Imp:  Post op nausea  Plan:  Since it is late, I will just have him monitored overnight.   He will be given a dulcolax and zofran and be limited to liquids.

## 2015-02-04 NOTE — Progress Notes (Signed)
3 Days Post-Op   Subjective: Fever of 101.2 yesterday evening with otherwise stable vital signs. Patient had a large blow out BM (loose stool) and passed a large amount of gas last night with improved abdominal pain and discomfort. He reports feeling somewhat better and less distended. He does note some musculoskeletal back pain today. He was able to tolerate a few bites of his pork and potatoes at dinner last night. Denies nausea & vomiting. Pain controlled with PO pain medications. Ambulating in halls. Denies shortness of breath, wheezing, feeling feverish, burning with urination.  Objective: Vital signs in last 24 hours: Temp:  [98.6 F (37 C)-101.2 F (38.4 C)] 98.6 F (37 C) (12/19 0500) Pulse Rate:  [75-97] 83 (12/19 0500) Resp:  [16-18] 18 (12/19 0500) BP: (113-147)/(53-83) 145/83 mmHg (12/19 0500) SpO2:  [91 %-100 %] 91 % (12/19 0500)  Intake/Output from previous day: 12/18 0701 - 12/19 0700 In: 656.7 [P.O.:240; I.V.:416.7] Out: 900 [Urine:900] Intake/Output this shift: Total I/O In: -  Out: 500 [Urine:500]  Physical Exam:  General:alert, cooperative and appears stated age GI: Abdomen softer, mildly distended, appropriately TTP, incisions C/D/I  Male genitalia: Foley out  Extremities: extremities normal, atraumatic, no cyanosis or edema  Lab Results:  Recent Labs  02/02/15 0425 02/03/15 1005 02/04/15 0458  HGB 11.2* 11.1* 11.5*  HCT 32.6* 32.2* 34.7*   BMET  Recent Labs  02/03/15 1005 02/04/15 0458  NA 137 137  K 4.1 4.0  CL 99* 97*  CO2 31 32  GLUCOSE 116* 137*  BUN 25* 27*  CREATININE 2.10* 2.13*  CALCIUM 8.7* 8.5*   No results for input(s): LABPT, INR in the last 72 hours. No results for input(s): LABURIN in the last 72 hours. Results for orders placed or performed during the hospital encounter of 01/29/15  Urine culture     Status: None   Collection Time: 01/29/15  9:35 AM  Result Value Ref Range Status   Specimen Description URINE, CLEAN  CATCH  Final   Special Requests NONE  Final   Culture   Final    1,000 COLONIES/mL INSIGNIFICANT GROWTH Performed at [facility]    Report Status 01/30/2015 FINAL  Final    Studies/Results: No results found.  Assessment/Plan: 3 Days Post-Op Procedure(s) (LRB): RIGHT ROBOTIC ASSISTED LAPAROSOCOPY NEPHRECTOMY (Right)  69 yo M POD#3 right robotic partial converted to radical nephrectomy. Partial return of bowel function. Creatinine plateaued today at 2.1 (baseline 1.1)   If has another fever will proceed with CXR, UCx, BCx x 2 & CBC  Continue Heart healthy diet and let patient self-regulate   Continue mIVF at 125 given poor PO intake.  Encourage IS  Ambulate  Order heating pad PRN back pain  AM labs with CBC tomorrow  Bisacodyl suppository  Continue prophylactic [place] heparin  SCDs at all times  Re-assess in afternoon for possible discharge this afternoon if no additional fevers and if continuing to pass flatus +/- BMs.    LOS: 3 days   Acie Fredrickson 02/04/2015, 6:53 AM

## 2015-02-05 LAB — BASIC METABOLIC PANEL
ANION GAP: 7 (ref 5–15)
BUN: 22 mg/dL — AB (ref 6–20)
CALCIUM: 8.5 mg/dL — AB (ref 8.9–10.3)
CO2: 33 mmol/L — AB (ref 22–32)
Chloride: 96 mmol/L — ABNORMAL LOW (ref 101–111)
Creatinine, Ser: 1.76 mg/dL — ABNORMAL HIGH (ref 0.61–1.24)
GFR calc Af Amer: 43 mL/min — ABNORMAL LOW (ref >60)
GFR, EST NON AFRICAN AMERICAN: 37 mL/min — AB (ref >60)
GLUCOSE: 124 mg/dL — AB (ref 65–99)
Potassium: 4.1 mmol/L (ref 3.5–5.1)
Sodium: 136 mmol/L (ref 135–145)

## 2015-02-05 LAB — CBC
HCT: 29.7 % — ABNORMAL LOW (ref 39.0–52.0)
HEMOGLOBIN: 10.3 g/dL — AB (ref 13.0–17.0)
MCH: 31.7 pg (ref 26.0–34.0)
MCHC: 34.7 g/dL (ref 30.0–36.0)
MCV: 91.4 fL (ref 78.0–100.0)
Platelets: 168 10*3/uL (ref 150–400)
RBC: 3.25 MIL/uL — ABNORMAL LOW (ref 4.22–5.81)
RDW: 12.2 % (ref 11.5–15.5)
WBC: 10.6 10*3/uL — ABNORMAL HIGH (ref 4.0–10.5)

## 2015-02-05 MED ORDER — TRAMADOL HCL 50 MG PO TABS
100.0000 mg | ORAL_TABLET | Freq: Four times a day (QID) | ORAL | Status: DC | PRN
  Administered 2015-02-05: 100 mg via ORAL
  Filled 2015-02-05: qty 2

## 2015-02-05 MED ORDER — HYDROCODONE-ACETAMINOPHEN 5-325 MG PO TABS
1.0000 | ORAL_TABLET | ORAL | Status: DC | PRN
  Administered 2015-02-05: 2 via ORAL
  Filled 2015-02-05: qty 2

## 2015-02-05 MED ORDER — FLEET ENEMA 7-19 GM/118ML RE ENEM: 1.0000 | ENEMA | Freq: Every day | RECTAL | Status: DC | PRN

## 2015-02-05 MED ORDER — TRAMADOL HCL 50 MG PO TABS: 50.0000 mg | ORAL_TABLET | Freq: Four times a day (QID) | ORAL | Status: DC | PRN

## 2015-02-05 NOTE — Progress Notes (Signed)
Patient is A&Ox 4. Discharge instruction reviewed with patient and family. Questions, concerns were denied. Pt is ambulatory, gait steady. F/u visit has been scheduled.

## 2015-02-05 NOTE — Care Management Note (Signed)
Case Management Note  Patient Details  Name: [name] MRN: [id number] Date of Birth: [birth year]  Subjective/Objective:                    Action/Plan:d/c home no needs or orders.   Expected Discharge Date:                  Expected Discharge Plan:  Home/Self Care  In-House Referral:     Discharge planning Services  CM Consult  Post Acute Care Choice:    Choice offered to:     DME Arranged:    DME Agency:     HH Arranged:    [place] Agency:     Status of Service:  Completed, signed off  Medicare Important Message Given:  Yes Date Medicare IM Given:    Medicare IM give by:    Date Additional Medicare IM Given:    Additional Medicare Important Message give by:     If discussed at [place] of Stay Meetings, dates discussed:    Additional Comments:  Dessa Phi, RN 02/05/2015, 10:43 AM

## 2015-02-05 NOTE — Progress Notes (Signed)
4 Days Post-Op   Subjective: AF, VSS over past 24 hours. Was discharged yesterday PM but developed nausea and and 1x emesis. He feels the narcotics make him nauseated. Passing a small amount of flatus. Less abdominal distension. Ambulating, requesting regular diet this AM. Continues to have some itching with oxycodone.   Objective: Vital signs in last 24 hours: Temp:  [98 F (36.7 C)-99.4 F (37.4 C)] 98 F (36.7 C) (12/20 0547) Pulse Rate:  [75-99] 78 (12/20 0547) Resp:  [16-17] 16 (12/20 0547) BP: (123-135)/(68-74) 123/68 mmHg (12/20 0547) SpO2:  [92 %-93 %] 92 % (12/20 0547)  Intake/Output from previous day: 12/19 0701 - 12/20 0700 In: 1240 [P.O.:240; I.V.:1000] Out: 1000 [Urine:1000] Intake/Output this shift:    Physical Exam:  General:alert, cooperative and appears stated age GI: Abdomen soft, non-distended, appropriately TTP, incisions C/D/I  Male genitalia: Foley out  Extremities: extremities normal, atraumatic, no cyanosis or edema  Lab Results:  Recent Labs  02/03/15 1005 02/04/15 0458 02/05/15 0433  HGB 11.1* 11.5* 10.3*  HCT 32.2* 34.7* 29.7*   BMET  Recent Labs  02/04/15 0458 02/05/15 0433  NA 137 136  K 4.0 4.1  CL 97* 96*  CO2 32 33*  GLUCOSE 137* 124*  BUN 27* 22*  CREATININE 2.13* 1.76*  CALCIUM 8.5* 8.5*   No results for input(s): LABPT, INR in the last 72 hours. No results for input(s): LABURIN in the last 72 hours. Results for orders placed or performed during the hospital encounter of 01/29/15  Urine culture     Status: None   Collection Time: 01/29/15  9:35 AM  Result Value Ref Range Status   Specimen Description URINE, CLEAN CATCH  Final   Special Requests NONE  Final   Culture   Final    1,000 COLONIES/mL INSIGNIFICANT GROWTH Performed at [facility]    Report Status 01/30/2015 FINAL  Final    Studies/Results: No results found.  Assessment/Plan: 4 Days Post-Op Procedure(s) (LRB): RIGHT ROBOTIC ASSISTED  LAPAROSOCOPY NEPHRECTOMY (Right)  69 yo M POD#4 right robotic partial converted to radical nephrectomy. Creatinine improved to 1.7 today (baseline 1.1)   Restart Heart healthy diet   Change to PO norco  Take zofran prior to norco for nausea with narcotics  Enema this AM  Medlock  Encourage IS  Ambulate  Heating pad PRN back pain, OOB to chair  Continue prophylactic [place] heparin  SCDs at all times  Possible D/C later this afternoon    LOS: 4 days   [name] 02/05/2015, 7:55 AM

## 2015-06-24 ENCOUNTER — Ambulatory Visit: Payer: Self-pay | Admitting: Surgery

## 2015-06-24 NOTE — H&P (Signed)
Win Guajardo 06/24/2015 9:43 AM Location: [facility] Patient #: [id number] DOB: September 05, 1945 Married / Language: English / Race: White Male  History of Present Illness Adin Hector MD; 06/24/2015 10:11 AM) The patient is a 70 year old male who presents with an incisional hernia. Note for "Incisional hernia": Patient sent for surgical consultation at the request of his urologist, Dr. Louis Meckel. Concern for hernia at a robotic port site. Status post radical nephrectomy December 2016.  Pleasant overweight male. Underwent robotically assisted right radical nephrectomy 02/01/2015 . Clear cell renal cell cancer. He also had a hernia at his belly button primarily repaired. Patient noticed a lump in his right upper port site. Other 4 ports sites fine. Eczema but his belly button as well. Discuss with his urologist. Concern for hernias. Surgical consultation requested.  Patient had a right partial lung lobectomy in the distant past. He no longer smokes. Usually moves his bowels every day. Sometimes mild constipation. Rather physically active. Can walk an hour without difficulty. He comes today with his daughter. Patient does think that the hernias got slightly larger. His daughter thinks it is bigger as well. Sensitive to touch. Sort of reduced. He was concerned. Wishes to have it repaired. No history of skin infection or MRSA. He's not immunosuppressed. He is not diabetic. Takes a baby aspirin but is on no other blood thinners. Tolerated the robotic nephrectomy rather well. No perioperative events.   Other Problems Davy Pique Bynum, CMA; 06/24/2015 9:43 AM) Back Pain Cancer Heart murmur High blood pressure Hypercholesterolemia Lung Cancer Melanoma  Past Surgical History Marjean Donna, [place]; 06/24/2015 9:43 AM) Appendectomy Gallbladder Surgery - Open Lung Surgery Right. Nephrectomy Right.  Allergies (Sonya Bynum, CMA; 06/24/2015 9:44 AM) No Known Drug  Allergies 06/24/2015  Medication History (Sonya Bynum, CMA; 06/24/2015 9:46 AM) AmLODIPine Besylate ('5MG'$  Tablet, Oral) Active. Lisinopril-Hydrochlorothiazide (10-12.'5MG'$  Tablet, Oral) Active. Omeprazole ('20MG'$  Capsule DR, Oral) Active. TraZODone HCl ('150MG'$  Tablet, Oral) Active. Atorvastatin Calcium ('10MG'$  Tablet, Oral) Active. RaNITidine HCl ('150MG'$  Capsule, Oral) Active. TraMADol HCl ('50MG'$  Tablet, Oral as needed) Active. Medications Reconciled  Social History Marjean Donna, CMA; 06/24/2015 9:43 AM) Tobacco use Former smoker.  Family History Marjean Donna, [place]; 06/24/2015 9:43 AM) Cancer Brother. Family history unknown First Degree Relatives Heart Disease Father. Heart disease in male family member before age 93     Review of Systems ([place]; 06/24/2015 9:43 AM) General Present- Fatigue. Not Present- Appetite Loss, Chills, Fever, Night Sweats, Weight Gain and Weight Loss. HEENT Not Present- Earache, Hearing Loss, Hoarseness, Nose Bleed, Oral Ulcers, Ringing in the Ears, Seasonal Allergies, Sinus Pain, Sore Throat, Visual Disturbances, Wears glasses/contact lenses and Yellow Eyes. Respiratory Not Present- Bloody sputum, Chronic Cough, Difficulty Breathing, Snoring and Wheezing. Cardiovascular Not Present- Chest Pain, Difficulty Breathing Lying Down, Leg Cramps, Palpitations, Rapid Heart Rate, Shortness of Breath and Swelling of Extremities. Gastrointestinal Not Present- Abdominal Pain, Bloating, Bloody Stool, Change in Bowel Habits, Chronic diarrhea, Constipation, Difficulty Swallowing, Excessive gas, Gets full quickly at meals, Hemorrhoids, Indigestion, Nausea, Rectal Pain and Vomiting. Male Genitourinary Not Present- Blood in Urine, Change in Urinary Stream, Frequency, Impotence, Nocturia, Painful Urination, Urgency and Urine Leakage. Neurological Not Present- Decreased Memory, Fainting, Headaches, Numbness, Seizures, Tingling, Tremor, Trouble walking and  Weakness. Psychiatric Not Present- Anxiety, Bipolar, Change in Sleep Pattern, Depression, Fearful and Frequent crying. Endocrine Not Present- Cold Intolerance, Excessive Hunger, Hair Changes, Heat Intolerance, Hot flashes and New Diabetes. Hematology Not Present- Easy Bruising, Excessive bleeding, Gland problems, HIV and Persistent Infections.  Vitals (Sonya Bynum CMA; 06/24/2015 9:44 AM) 06/24/2015 9:43 AM Weight: 207 lb Height: 69in Body Surface Area: 2.1 m Body Mass Index: 30.57 kg/m  Temp.: 20F(Temporal)  Pulse: 76 (Regular)  BP: 130/72 (Sitting, Left Arm, Standard)      Assessment & Plan Adin Hector MD; 06/24/2015 10:10 AM)  RECURRENT UMBILICAL HERNIA (Y65.0)  INCISIONAL HERNIA, WITHOUT OBSTRUCTION OR GANGRENE (K43.2) Impression: Obvious incisional hernia in one of his robotic port sites in the right upper quadrant. About 10 cm away from the umbilical hernia as well.  I think he would benefit from repair. I would do a laparoscopic underlay mesh repair given his thin abdominal wall and obesity and recurrence with primary suturing. Did caution him about postoperative soreness but from a hemodynamic and cardiopulmonary risk, should be less risky than the nephrectomy. Patient wishes to have surgery. Dr. agrees. We'll work to coordinate convenient time  [facility] [place] ([phone])  Current Plans You are being scheduled for surgery - Our schedulers will call you.  You should hear from our office's scheduling department within 5 working days about the location, date, and time of surgery. We try to make accommodations for patient's preferences in scheduling surgery, but sometimes the OR schedule or the surgeon's schedule prevents [place] from making those accommodations.  If you have not heard from our office [phone]) in 5 working days, call the office and ask for your surgeon's nurse.  If you have other questions about your diagnosis, plan,  or surgery, call the office and ask for your surgeon's nurse.  Written instructions provided The anatomy & physiology of the abdominal wall was discussed. The pathophysiology of hernias was discussed. Natural history risks without surgery including progeressive enlargement, pain, incarceration, & strangulation was discussed. Contributors to complications such as smoking, obesity, diabetes, prior surgery, etc were discussed.  I feel the risks of no intervention will lead to serious problems that outweigh the operative risks; therefore, I recommended surgery to reduce and repair the hernia. I explained laparoscopic techniques with possible need for an open approach. I noted the probable use of mesh to patch and/or buttress the hernia repair  Risks such as bleeding, infection, abscess, need for further treatment, heart attack, death, and other risks were discussed. I noted a good likelihood this will help address the problem. Goals of post-operative recovery were discussed as well. Possibility that this will not correct all symptoms was explained. I stressed the importance of low-impact activity, aggressive pain control, avoiding constipation, & not pushing through pain to minimize risk of post-operative chronic pain or injury. Possibility of reherniation especially with smoking, obesity, diabetes, immunosuppression, and other health conditions was discussed. We will work to minimize complications.  An educational handout further explaining the pathology & treatment options was given as well. Questions were answered. The patient expresses understanding & wishes to proceed with surgery.  Pt Education - Pamphlet Given - Laparoscopic Hernia Repair: discussed with patient and provided information. Pt Education - CCS Hernia Post-Op HCI (Robinette Esters): discussed with pt  Adin Hector, M.D., F.A.C.S. Gastrointestinal and Minimally Invasive Surgery [facility] [address], [place], [place] [address] [phone] Main / Paging

## 2015-07-19 ENCOUNTER — Other Ambulatory Visit: Payer: Self-pay | Admitting: Physical Medicine and Rehabilitation

## 2015-07-19 DIAGNOSIS — G8929 Other chronic pain: Secondary | ICD-10-CM

## 2015-07-19 DIAGNOSIS — M545 Low back pain, unspecified: Secondary | ICD-10-CM

## 2015-07-26 NOTE — Patient Instructions (Signed)
[name]  07/26/2015   Your procedure is scheduled on: 08/01/2015   Report to [facility] Main  Entrance take [name]  elevators to 3rd floor to  [name] at    [name].  Call this number if you have problems the morning of surgery [phone]   Remember: ONLY 1 PERSON MAY GO WITH YOU TO SHORT STAY TO GET  READY MORNING OF [name].  Do not eat food or drink liquids :After Midnight.     Take these medicines the morning of surgery with A SIP OF WATER: Amlodipine ( NOrvasc), Prilosec or Zantac                                 You may not have any metal on your body including hair pins and              piercings  Do not wear jewelry, , lotions, powders or perfumes, deodorant               Men may shave face and neck.   Do not bring valuables to the hospital. [name].  Contacts, dentures or bridgework may not be worn into surgery.       Patients discharged the day of surgery will not be allowed to drive home.  Name and phone number of your driver:  Special Instructions: coughing and deep breathing exercises, leg exercises               Please read over the following fact sheets you were given: _____________________________________________________________________             [name] - Preparing for Surgery Before surgery, you can play an important role.  Because skin is not sterile, your skin needs to be as free of germs as possible.  You can reduce the number of germs on your skin by washing with CHG (chlorahexidine gluconate) soap before surgery.  CHG is an antiseptic cleaner which kills germs and bonds with the skin to continue killing germs even after washing. Please DO NOT use if you have an allergy to CHG or antibacterial soaps.  If your skin becomes reddened/irritated stop using the CHG and inform your nurse when you arrive at Short Stay. Do not shave (including legs and underarms) for at  least 48 hours prior to the first CHG shower.  You may shave your face/neck. Please follow these instructions carefully:  1.  Shower with CHG Soap the night before surgery and the  morning of Surgery.  2.  If you choose to wash your hair, wash your hair first as usual with your  normal  shampoo.  3.  After you shampoo, rinse your hair and body thoroughly to remove the  shampoo.                           4.  Use CHG as you would any other liquid soap.  You can apply chg directly  to the skin and wash                       Gently with a scrungie or clean washcloth.  5.  Apply the  CHG Soap to your body ONLY FROM THE NECK DOWN.   Do not use on face/ open                           Wound or open sores. Avoid contact with eyes, ears mouth and genitals (private parts).                       Wash face,  Genitals (private parts) with your normal soap.             6.  Wash thoroughly, paying special attention to the area where your surgery  will be performed.  7.  Thoroughly rinse your body with warm water from the neck down.  8.  DO NOT shower/wash with your normal soap after using and rinsing off  the CHG Soap.                9.  Pat yourself dry with a clean towel.            10.  Wear clean pajamas.            11.  Place clean sheets on your bed the night of your first shower and do not  sleep with pets. Day of Surgery : Do not apply any lotions/deodorants the morning of surgery.  Please wear clean clothes to the hospital/surgery center.  FAILURE TO FOLLOW THESE INSTRUCTIONS MAY RESULT IN THE CANCELLATION OF YOUR SURGERY PATIENT SIGNATURE_________________________________  NURSE SIGNATURE__________________________________  ________________________________________________________________________

## 2015-07-29 ENCOUNTER — Encounter (HOSPITAL_COMMUNITY)
Admission: RE | Admit: 2015-07-29 | Discharge: 2015-07-29 | Disposition: A | Discharge status: Home / Self Care | Payer: Medicare Other | Source: Ambulatory Visit | Attending: Surgery | Admitting: Surgery

## 2015-07-29 ENCOUNTER — Ambulatory Visit
Admission: RE | Admit: 2015-07-29 | Discharge: 2015-07-29 | Disposition: A | Discharge status: Home / Self Care | Payer: Medicare Other | Source: Ambulatory Visit | Attending: Physical Medicine and Rehabilitation | Admitting: Physical Medicine and Rehabilitation

## 2015-07-29 ENCOUNTER — Encounter (HOSPITAL_COMMUNITY): Payer: Self-pay

## 2015-07-29 DIAGNOSIS — Z6830 Body mass index (BMI) 30.0-30.9, adult: Secondary | ICD-10-CM | POA: Diagnosis not present

## 2015-07-29 DIAGNOSIS — G8929 Other chronic pain: Secondary | ICD-10-CM

## 2015-07-29 DIAGNOSIS — I10 Essential (primary) hypertension: Secondary | ICD-10-CM | POA: Diagnosis not present

## 2015-07-29 DIAGNOSIS — M545 Low back pain, unspecified: Secondary | ICD-10-CM

## 2015-07-29 DIAGNOSIS — I2510 Atherosclerotic heart disease of native coronary artery without angina pectoris: Secondary | ICD-10-CM | POA: Diagnosis not present

## 2015-07-29 DIAGNOSIS — Z79899 Other long term (current) drug therapy: Secondary | ICD-10-CM | POA: Diagnosis not present

## 2015-07-29 DIAGNOSIS — K432 Incisional hernia without obstruction or gangrene: Secondary | ICD-10-CM | POA: Diagnosis present

## 2015-07-29 DIAGNOSIS — E7800 Pure hypercholesterolemia, unspecified: Secondary | ICD-10-CM | POA: Diagnosis not present

## 2015-07-29 DIAGNOSIS — Z87891 Personal history of nicotine dependence: Secondary | ICD-10-CM | POA: Diagnosis not present

## 2015-07-29 DIAGNOSIS — Z905 Acquired absence of kidney: Secondary | ICD-10-CM | POA: Diagnosis not present

## 2015-07-29 DIAGNOSIS — K429 Umbilical hernia without obstruction or gangrene: Secondary | ICD-10-CM | POA: Diagnosis not present

## 2015-07-29 DIAGNOSIS — E669 Obesity, unspecified: Secondary | ICD-10-CM | POA: Diagnosis not present

## 2015-07-29 DIAGNOSIS — K219 Gastro-esophageal reflux disease without esophagitis: Secondary | ICD-10-CM | POA: Diagnosis not present

## 2015-07-29 DIAGNOSIS — M1990 Unspecified osteoarthritis, unspecified site: Secondary | ICD-10-CM | POA: Diagnosis not present

## 2015-07-29 HISTORY — DX: Chronic kidney disease, unspecified: N18.9

## 2015-07-29 HISTORY — DX: Cardiac murmur, unspecified: R01.1

## 2015-07-29 LAB — CBC
HCT: 40.2 % (ref 39.0–52.0)
Hemoglobin: 13.7 g/dL (ref 13.0–17.0)
MCH: 30 pg (ref 26.0–34.0)
MCHC: 34.1 g/dL (ref 30.0–36.0)
MCV: 88.2 fL (ref 78.0–100.0)
PLATELETS: 207 10*3/uL (ref 150–400)
RBC: 4.56 MIL/uL (ref 4.22–5.81)
RDW: 12.9 % (ref 11.5–15.5)
WBC: 8 10*3/uL (ref 4.0–10.5)

## 2015-07-29 LAB — BASIC METABOLIC PANEL
Anion gap: 6 (ref 5–15)
BUN: 28 mg/dL — ABNORMAL HIGH (ref 6–20)
CALCIUM: 9.4 mg/dL (ref 8.9–10.3)
CHLORIDE: 103 mmol/L (ref 101–111)
CO2: 30 mmol/L (ref 22–32)
Creatinine, Ser: 1.64 mg/dL — ABNORMAL HIGH (ref 0.61–1.24)
GFR calc Af Amer: 47 mL/min — ABNORMAL LOW (ref >60)
GFR calc non Af Amer: 40 mL/min — ABNORMAL LOW (ref >60)
GLUCOSE: 107 mg/dL — AB (ref 65–99)
Potassium: 5.1 mmol/L (ref 3.5–5.1)
Sodium: 139 mmol/L (ref 135–145)

## 2015-07-29 MED ORDER — MEPERIDINE HCL 100 MG/ML IJ SOLN
75.0000 mg | Freq: Once | INTRAMUSCULAR | Status: AC
  Administered 2015-07-29: 75 mg via INTRAMUSCULAR

## 2015-07-29 MED ORDER — IOPAMIDOL (ISOVUE-M 200) INJECTION 41%
15.0000 mL | Freq: Once | INTRAMUSCULAR | Status: AC
  Administered 2015-07-29: 15 mL via INTRATHECAL

## 2015-07-29 MED ORDER — ONDANSETRON HCL 4 MG/2ML IJ SOLN
4.0000 mg | Freq: Once | INTRAMUSCULAR | Status: AC
  Administered 2015-07-29: 4 mg via INTRAMUSCULAR

## 2015-07-29 MED ORDER — DIAZEPAM 5 MG PO TABS
5.0000 mg | ORAL_TABLET | Freq: Once | ORAL | Status: AC
  Administered 2015-07-29: 5 mg via ORAL

## 2015-07-29 NOTE — Progress Notes (Signed)
I have reviewed the tests.  Stable elevated Cr.  There are no major concerns.  Okay to proceed with surgery from my standpoint.  Defer to anesthesia

## 2015-07-29 NOTE — Progress Notes (Signed)
BMP done 07/29/15 routed via EPIC to Dr Johney Maine.

## 2015-07-29 NOTE — Progress Notes (Signed)
EKG- 10/27/14- chart  05/27/15- LOV- Cardiology- on chart

## 2015-07-29 NOTE — Progress Notes (Signed)
Patient states he has been off Desyrel for at least the past two days.

## 2015-07-29 NOTE — Discharge Instructions (Signed)
Myelogram Discharge Instructions  1. Go home and rest quietly for the next 24 hours.  It is important to lie flat for the next 24 hours.  Get up only to go to the restroom.  You may lie in the bed or on a couch on your back, your stomach, your left side or your right side.  You may have one pillow under your head.  You may have pillows between your knees while you are on your side or under your knees while you are on your back.  2. DO NOT drive today.  Recline the seat as far back as it will go, while still wearing your seat belt, on the way home.  3. You may get up to go to the bathroom as needed.  You may sit up for 10 minutes to eat.  You may resume your normal diet and medications unless otherwise indicated.  Drink lots of extra fluids today and tomorrow.  4. The incidence of headache, nausea, or vomiting is about 5% (one in 20 patients).  If you develop a headache, lie flat and drink plenty of fluids until the headache goes away.  Caffeinated beverages may be helpful.  If you develop severe nausea and vomiting or a headache that does not go away with flat bed rest, call [phone].  5. You may resume normal activities after your 24 hours of bed rest is over; however, do not exert yourself strongly or do any heavy lifting tomorrow. If when you get up you have a headache when standing, go back to bed and force fluids for another 24 hours.  6. Call your physician for a follow-up appointment.  The results of your myelogram will be sent directly to your physician by the following day.  7. If you have any questions or if complications develop after you arrive home, please call [phone].  Discharge instructions have been explained to the patient.  The patient, or the person responsible for the patient, fully understands these instructions.       May resume Dsyrel on July 30, 2015, after 11:00 am.

## 2015-07-31 NOTE — Anesthesia Preprocedure Evaluation (Addendum)
Anesthesia Evaluation  Patient identified by MRN, date of birth, ID band Patient awake    Reviewed: Allergy & Precautions, NPO status , Patient's Chart, lab work & pertinent test results  Airway Mallampati: II  TM Distance: >3 FB Neck ROM: Full    Dental no notable dental hx.    Pulmonary former smoker,  S/p right lung lobectomy 1992   Pulmonary exam normal breath sounds clear to auscultation       Cardiovascular Exercise Tolerance: Good hypertension, Pt. on medications + CAD  Normal cardiovascular exam+ Valvular Problems/Murmurs  Rhythm:Regular Rate:Normal     Neuro/Psych negative neurological ROS  negative psych ROS   GI/Hepatic Neg liver ROS, GERD  Medicated,  Endo/Other  negative endocrine ROS  Renal/GU negative Renal ROS  negative genitourinary   Musculoskeletal  (+) Arthritis ,   Abdominal (+) + obese,   Peds negative pediatric ROS (+)  Hematology negative hematology ROS (+)   Anesthesia Other Findings   Reproductive/Obstetrics negative OB ROS                            Anesthesia Physical  Anesthesia Plan  ASA: III  Anesthesia Plan: General   Post-op Pain Management:    Induction: Intravenous  Airway Management Planned: Oral ETT  Additional Equipment:   Intra-op Plan:   Post-operative Plan: Extubation in OR  Informed Consent: I have reviewed the patients History and Physical, chart, labs and discussed the procedure including the risks, benefits and alternatives for the proposed anesthesia with the patient or authorized representative who has indicated his/her understanding and acceptance.   Dental advisory given  Plan Discussed with: CRNA  Anesthesia Plan Comments:         Anesthesia Quick Evaluation

## 2015-08-01 ENCOUNTER — Ambulatory Visit (HOSPITAL_COMMUNITY)
Admission: RE | Admit: 2015-08-01 | Discharge: 2015-08-01 | Disposition: A | Discharge status: Home / Self Care | Payer: Medicare Other | Source: Ambulatory Visit | Attending: Surgery | Admitting: Surgery

## 2015-08-01 ENCOUNTER — Ambulatory Visit (HOSPITAL_COMMUNITY): Payer: Medicare Other | Admitting: Certified Registered Nurse Anesthetist

## 2015-08-01 ENCOUNTER — Encounter (HOSPITAL_COMMUNITY)
Admission: RE | Disposition: A | Discharge status: Home / Self Care | Payer: Self-pay | Source: Ambulatory Visit | Attending: Surgery

## 2015-08-01 ENCOUNTER — Encounter (HOSPITAL_COMMUNITY): Payer: Self-pay | Admitting: *Deleted

## 2015-08-01 DIAGNOSIS — Z79899 Other long term (current) drug therapy: Secondary | ICD-10-CM | POA: Insufficient documentation

## 2015-08-01 DIAGNOSIS — I119 Hypertensive heart disease without heart failure: Secondary | ICD-10-CM | POA: Diagnosis present

## 2015-08-01 DIAGNOSIS — C649 Malignant neoplasm of unspecified kidney, except renal pelvis: Secondary | ICD-10-CM | POA: Diagnosis present

## 2015-08-01 DIAGNOSIS — K432 Incisional hernia without obstruction or gangrene: Secondary | ICD-10-CM | POA: Diagnosis not present

## 2015-08-01 DIAGNOSIS — Z6830 Body mass index (BMI) 30.0-30.9, adult: Secondary | ICD-10-CM | POA: Insufficient documentation

## 2015-08-01 DIAGNOSIS — M1990 Unspecified osteoarthritis, unspecified site: Secondary | ICD-10-CM | POA: Insufficient documentation

## 2015-08-01 DIAGNOSIS — I10 Essential (primary) hypertension: Secondary | ICD-10-CM | POA: Insufficient documentation

## 2015-08-01 DIAGNOSIS — K219 Gastro-esophageal reflux disease without esophagitis: Secondary | ICD-10-CM | POA: Diagnosis present

## 2015-08-01 DIAGNOSIS — Z905 Acquired absence of kidney: Secondary | ICD-10-CM | POA: Diagnosis not present

## 2015-08-01 DIAGNOSIS — K429 Umbilical hernia without obstruction or gangrene: Secondary | ICD-10-CM

## 2015-08-01 DIAGNOSIS — I2510 Atherosclerotic heart disease of native coronary artery without angina pectoris: Secondary | ICD-10-CM | POA: Diagnosis present

## 2015-08-01 DIAGNOSIS — N189 Chronic kidney disease, unspecified: Secondary | ICD-10-CM | POA: Diagnosis present

## 2015-08-01 DIAGNOSIS — N184 Chronic kidney disease, stage 4 (severe): Secondary | ICD-10-CM | POA: Diagnosis present

## 2015-08-01 DIAGNOSIS — Z87891 Personal history of nicotine dependence: Secondary | ICD-10-CM | POA: Insufficient documentation

## 2015-08-01 DIAGNOSIS — E7800 Pure hypercholesterolemia, unspecified: Secondary | ICD-10-CM | POA: Diagnosis not present

## 2015-08-01 DIAGNOSIS — E669 Obesity, unspecified: Secondary | ICD-10-CM | POA: Insufficient documentation

## 2015-08-01 HISTORY — DX: Incisional hernia without obstruction or gangrene: K43.2

## 2015-08-01 HISTORY — DX: Umbilical hernia without obstruction or gangrene: K42.9

## 2015-08-01 HISTORY — PX: LAPAROSCOPIC LYSIS OF ADHESIONS: SHX5905

## 2015-08-01 HISTORY — PX: VENTRAL HERNIA REPAIR: SHX424

## 2015-08-01 HISTORY — DX: Malignant neoplasm of unspecified kidney, except renal pelvis: C64.9

## 2015-08-01 SURGERY — REPAIR, HERNIA, VENTRAL, LAPAROSCOPIC
Anesthesia: General | Site: Abdomen

## 2015-08-01 MED ORDER — LIDOCAINE HCL (CARDIAC) 20 MG/ML IV SOLN
INTRAVENOUS | Status: AC
  Filled 2015-08-01: qty 5

## 2015-08-01 MED ORDER — DIPHENHYDRAMINE HCL 50 MG/ML IJ SOLN
INTRAMUSCULAR | Status: AC
  Filled 2015-08-01: qty 1

## 2015-08-01 MED ORDER — CEFAZOLIN SODIUM-DEXTROSE 2-4 GM/100ML-% IV SOLN
2.0000 g | INTRAVENOUS | Status: AC
  Administered 2015-08-01: 2 g via INTRAVENOUS

## 2015-08-01 MED ORDER — FENTANYL CITRATE (PF) 250 MCG/5ML IJ SOLN
INTRAMUSCULAR | Status: AC
  Filled 2015-08-01: qty 5

## 2015-08-01 MED ORDER — LACTATED RINGERS IV SOLN: INTRAVENOUS | Status: DC

## 2015-08-01 MED ORDER — FENTANYL CITRATE (PF) 100 MCG/2ML IJ SOLN
INTRAMUSCULAR | Status: AC
  Filled 2015-08-01: qty 2

## 2015-08-01 MED ORDER — DIPHENHYDRAMINE HCL 50 MG/ML IJ SOLN
12.5000 mg | Freq: Once | INTRAMUSCULAR | Status: AC
  Administered 2015-08-01: 12.5 mg via INTRAVENOUS

## 2015-08-01 MED ORDER — SUCCINYLCHOLINE CHLORIDE 20 MG/ML IJ SOLN
INTRAMUSCULAR | Status: DC | PRN
  Administered 2015-08-01: 100 mg via INTRAVENOUS

## 2015-08-01 MED ORDER — MEPERIDINE HCL 50 MG/ML IJ SOLN: 6.2500 mg | INTRAMUSCULAR | Status: DC | PRN

## 2015-08-01 MED ORDER — FAMOTIDINE 20 MG PO TABS
20.0000 mg | ORAL_TABLET | Freq: Two times a day (BID) | ORAL | Status: DC
  Filled 2015-08-01 (×4): qty 1

## 2015-08-01 MED ORDER — PROMETHAZINE HCL 25 MG/ML IJ SOLN: 6.2500 mg | INTRAMUSCULAR | Status: DC | PRN

## 2015-08-01 MED ORDER — SUGAMMADEX SODIUM 200 MG/2ML IV SOLN
INTRAVENOUS | Status: DC | PRN
  Administered 2015-08-01: 200 mg via INTRAVENOUS

## 2015-08-01 MED ORDER — BUPIVACAINE-EPINEPHRINE 0.25% -1:200000 IJ SOLN
INTRAMUSCULAR | Status: DC | PRN
  Administered 2015-08-01: 46 mL

## 2015-08-01 MED ORDER — BUPIVACAINE LIPOSOME 1.3 % IJ SUSP
INTRAMUSCULAR | Status: DC | PRN
  Administered 2015-08-01: 46 mL

## 2015-08-01 MED ORDER — EPHEDRINE SULFATE 50 MG/ML IJ SOLN
INTRAMUSCULAR | Status: AC
  Filled 2015-08-01: qty 1

## 2015-08-01 MED ORDER — DEXTROSE 5 % IV SOLN
1000.0000 mg | Freq: Four times a day (QID) | INTRAVENOUS | Status: DC | PRN
  Administered 2015-08-01: 1000 mg via INTRAVENOUS
  Filled 2015-08-01 (×3): qty 10

## 2015-08-01 MED ORDER — BUPIVACAINE LIPOSOME 1.3 % IJ SUSP
20.0000 mL | INTRAMUSCULAR | Status: DC
  Filled 2015-08-01: qty 20

## 2015-08-01 MED ORDER — LACTATED RINGERS IV SOLN
INTRAVENOUS | Status: DC | PRN
  Administered 2015-08-01 (×2): via INTRAVENOUS

## 2015-08-01 MED ORDER — DEXAMETHASONE SODIUM PHOSPHATE 10 MG/ML IJ SOLN
INTRAMUSCULAR | Status: DC | PRN
  Administered 2015-08-01: 10 mg via INTRAVENOUS

## 2015-08-01 MED ORDER — HYDROMORPHONE HCL 1 MG/ML IJ SOLN: 0.2500 mg | INTRAMUSCULAR | Status: DC | PRN

## 2015-08-01 MED ORDER — FENTANYL CITRATE (PF) 100 MCG/2ML IJ SOLN
INTRAMUSCULAR | Status: DC | PRN
  Administered 2015-08-01 (×7): 50 ug via INTRAVENOUS

## 2015-08-01 MED ORDER — PHENYLEPHRINE 40 MCG/ML (10ML) SYRINGE FOR IV PUSH (FOR BLOOD PRESSURE SUPPORT)
PREFILLED_SYRINGE | INTRAVENOUS | Status: AC
  Filled 2015-08-01: qty 20

## 2015-08-01 MED ORDER — 0.9 % SODIUM CHLORIDE (POUR BTL) OPTIME
TOPICAL | Status: DC | PRN
  Administered 2015-08-01: 2000 mL

## 2015-08-01 MED ORDER — HYDROMORPHONE HCL 1 MG/ML IJ SOLN
INTRAMUSCULAR | Status: AC
  Filled 2015-08-01: qty 1

## 2015-08-01 MED ORDER — PHENYLEPHRINE 40 MCG/ML (10ML) SYRINGE FOR IV PUSH (FOR BLOOD PRESSURE SUPPORT)
PREFILLED_SYRINGE | INTRAVENOUS | Status: AC
  Filled 2015-08-01: qty 10

## 2015-08-01 MED ORDER — ATROPINE SULFATE 0.4 MG/ML IJ SOLN
INTRAMUSCULAR | Status: AC
  Filled 2015-08-01: qty 1

## 2015-08-01 MED ORDER — ROCURONIUM BROMIDE 100 MG/10ML IV SOLN
INTRAVENOUS | Status: AC
  Filled 2015-08-01: qty 1

## 2015-08-01 MED ORDER — PROPOFOL 10 MG/ML IV BOLUS
INTRAVENOUS | Status: DC | PRN
  Administered 2015-08-01: 150 mg via INTRAVENOUS

## 2015-08-01 MED ORDER — SODIUM CHLORIDE 0.9 % IJ SOLN
INTRAMUSCULAR | Status: AC
  Filled 2015-08-01: qty 10

## 2015-08-01 MED ORDER — BUPIVACAINE-EPINEPHRINE 0.25% -1:200000 IJ SOLN
INTRAMUSCULAR | Status: AC
  Filled 2015-08-01: qty 1

## 2015-08-01 MED ORDER — ROCURONIUM BROMIDE 100 MG/10ML IV SOLN
INTRAVENOUS | Status: DC | PRN
  Administered 2015-08-01: 10 mg via INTRAVENOUS
  Administered 2015-08-01: 50 mg via INTRAVENOUS

## 2015-08-01 MED ORDER — PROPOFOL 10 MG/ML IV BOLUS
INTRAVENOUS | Status: AC
  Filled 2015-08-01: qty 20

## 2015-08-01 MED ORDER — LIDOCAINE HCL (CARDIAC) 20 MG/ML IV SOLN
INTRAVENOUS | Status: DC | PRN
  Administered 2015-08-01: 100 mg via INTRAVENOUS

## 2015-08-01 MED ORDER — PHENYLEPHRINE HCL 10 MG/ML IJ SOLN
INTRAMUSCULAR | Status: DC | PRN
  Administered 2015-08-01: 120 ug via INTRAVENOUS
  Administered 2015-08-01 (×3): 80 ug via INTRAVENOUS
  Administered 2015-08-01 (×2): 120 ug via INTRAVENOUS
  Administered 2015-08-01: 80 ug via INTRAVENOUS
  Administered 2015-08-01: 120 ug via INTRAVENOUS

## 2015-08-01 MED ORDER — EPHEDRINE SULFATE 50 MG/ML IJ SOLN
INTRAMUSCULAR | Status: DC | PRN
  Administered 2015-08-01: 10 mg via INTRAVENOUS

## 2015-08-01 MED ORDER — CEFAZOLIN SODIUM-DEXTROSE 2-4 GM/100ML-% IV SOLN
INTRAVENOUS | Status: AC
  Filled 2015-08-01: qty 100

## 2015-08-01 MED ORDER — HYDROMORPHONE HCL 1 MG/ML IJ SOLN
0.2500 mg | INTRAMUSCULAR | Status: DC | PRN
  Administered 2015-08-01: 0.5 mg via INTRAVENOUS

## 2015-08-01 MED ORDER — ONDANSETRON HCL 4 MG/2ML IJ SOLN
INTRAMUSCULAR | Status: DC | PRN
  Administered 2015-08-01: 4 mg via INTRAVENOUS

## 2015-08-01 MED ORDER — ONDANSETRON HCL 4 MG/2ML IJ SOLN
INTRAMUSCULAR | Status: AC
Start: 2015-08-01 — End: 2015-08-01
  Filled 2015-08-01: qty 2

## 2015-08-01 MED ORDER — OXYCODONE HCL 5 MG PO TABS: 5.0000 mg | ORAL_TABLET | ORAL | Status: DC | PRN

## 2015-08-01 SURGICAL SUPPLY — 42 items
APPLIER CLIP 5 13 M/L LIGAMAX5 (MISCELLANEOUS)
BENZOIN TINCTURE PRP APPL 2/3 (GAUZE/BANDAGES/DRESSINGS) ×2 IMPLANT
BINDER ABDOMINAL 12 ML 46-62 (SOFTGOODS) IMPLANT
CABLE HIGH FREQUENCY MONO STRZ (ELECTRODE) ×2 IMPLANT
CHLORAPREP W/TINT 26ML (MISCELLANEOUS) ×2 IMPLANT
CLIP APPLIE 5 13 M/L LIGAMAX5 (MISCELLANEOUS) IMPLANT
COVER SURGICAL LIGHT HANDLE (MISCELLANEOUS) ×2 IMPLANT
DECANTER SPIKE VIAL GLASS SM (MISCELLANEOUS) ×2 IMPLANT
DEVICE SECURE STRAP 25 ABSORB (INSTRUMENTS) ×4 IMPLANT
DEVICE TROCAR PUNCTURE CLOSURE (ENDOMECHANICALS) ×2 IMPLANT
DRAPE LAPAROSCOPIC ABDOMINAL (DRAPES) ×2 IMPLANT
DRAPE WARM FLUID 44X44 (DRAPE) ×2 IMPLANT
DRSG TEGADERM 2-3/8X2-3/4 SM (GAUZE/BANDAGES/DRESSINGS) ×8 IMPLANT
DRSG TEGADERM 4X4.75 (GAUZE/BANDAGES/DRESSINGS) ×4 IMPLANT
ELECT REM PT RETURN 9FT ADLT (ELECTROSURGICAL) ×2
ELECTRODE REM PT RTRN 9FT ADLT (ELECTROSURGICAL) ×1 IMPLANT
GAUZE SPONGE 2X2 8PLY STRL LF (GAUZE/BANDAGES/DRESSINGS) ×1 IMPLANT
GLOVE ECLIPSE 8.0 STRL XLNG CF (GLOVE) ×2 IMPLANT
GLOVE INDICATOR 8.0 STRL GRN (GLOVE) ×2 IMPLANT
GOWN STRL REUS W/TWL XL LVL3 (GOWN DISPOSABLE) ×4 IMPLANT
KIT BASIN OR (CUSTOM PROCEDURE TRAY) ×2 IMPLANT
LIQUID BAND (GAUZE/BANDAGES/DRESSINGS) ×2 IMPLANT
MARKER SKIN DUAL TIP RULER LAB (MISCELLANEOUS) ×2 IMPLANT
MESH VENTRALIGHT ST 6X8 (Mesh Specialty) ×1 IMPLANT
MESH VENTRLGHT ELLIPSE 8X6XMFL (Mesh Specialty) ×1 IMPLANT
NEEDLE SPNL 22GX3.5 QUINCKE BK (NEEDLE) IMPLANT
PAD POSITIONING PINK XL (MISCELLANEOUS) ×2 IMPLANT
SCISSORS LAP 5X35 DISP (ENDOMECHANICALS) ×2 IMPLANT
SET IRRIG TUBING LAPAROSCOPIC (IRRIGATION / IRRIGATOR) IMPLANT
SHEARS HARMONIC ACE PLUS 36CM (ENDOMECHANICALS) IMPLANT
SLEEVE XCEL OPT CAN 5 100 (ENDOMECHANICALS) ×4 IMPLANT
SPONGE GAUZE 2X2 STER 10/PKG (GAUZE/BANDAGES/DRESSINGS) ×1
STRIP CLOSURE SKIN 1/2X4 (GAUZE/BANDAGES/DRESSINGS) ×4 IMPLANT
STRIP CLOSURE SKIN 1/4X4 (GAUZE/BANDAGES/DRESSINGS) ×2 IMPLANT
SUT MNCRL AB 4-0 PS2 18 (SUTURE) ×4 IMPLANT
SUT PDS AB 1 CT1 27 (SUTURE) ×6 IMPLANT
SUT PROLENE 1 CT 1 30 (SUTURE) ×12 IMPLANT
TOWEL OR 17X26 10 PK STRL BLUE (TOWEL DISPOSABLE) ×2 IMPLANT
TRAY LAPAROSCOPIC (CUSTOM PROCEDURE TRAY) ×2 IMPLANT
TROCAR BLADELESS OPT 5 100 (ENDOMECHANICALS) ×2 IMPLANT
TROCAR XCEL NON-BLD 11X100MML (ENDOMECHANICALS) IMPLANT
TUBING INSUF HEATED (TUBING) ×2 IMPLANT

## 2015-08-01 NOTE — H&P (Signed)
[name] 06/24/2015 9:43 AM Location: [facility] Patient #: [id number] DOB: November 18, 1945 Married / Language: English / Race: White Male  Patient Care Team: [name], MD as PCP - General (Family Medicine) Michael Boston, MD as Consulting Physician (General Surgery) Ardis Hughs, MD as Consulting Physician (Urology)    History of Present Illness   The patient is a 70 year old male who presents with an incisional hernia. Note for "Incisional hernia": Patient sent for surgical consultation at the request of his urologist, Dr. Louis Meckel. Concern for hernia at a robotic port site. Status post radical nephrectomy December 2016.  Pleasant overweight male. Underwent robotically assisted right radical nephrectomy 02/01/2015 . Clear cell renal cell cancer. He also had a hernia at his belly button primarily repaired. Patient noticed a lump in his right upper port site. Other 4 ports sites fine. Eczema but his belly button as well. Discuss with his urologist. Concern for hernias. Surgical consultation requested.  Patient had a right partial lung lobectomy in the distant past. He no longer smokes. Usually moves his bowels every day. Sometimes mild constipation. Rather physically active. Can walk an hour without difficulty. He comes today with his daughter. Patient does think that the hernias got slightly larger. His daughter thinks it is bigger as well. Sensitive to touch. Sort of reduced. He was concerned. Wishes to have it repaired. No history of skin infection or MRSA. He's not immunosuppressed. He is not diabetic. Takes a baby aspirin but is on no other blood thinners. Tolerated the robotic nephrectomy rather well. No perioperative events.   Other Problems Davy Pique Bynum, CMA; 06/24/2015 9:43 AM) Back Pain Cancer Heart murmur High blood pressure Hypercholesterolemia Lung Cancer Melanoma  Past Surgical History Marjean Donna, [place];  06/24/2015 9:43 AM) Appendectomy Gallbladder Surgery - Open Lung Surgery Right. Nephrectomy Right.  Allergies (Sonya Bynum, CMA; 06/24/2015 9:44 AM) No Known Drug Allergies05/09/2015  Medication History (Sonya Bynum, CMA; 06/24/2015 9:46 AM) AmLODIPine Besylate ('5MG'$  Tablet, Oral) Active. Lisinopril-Hydrochlorothiazide (10-12.'5MG'$  Tablet, Oral) Active. Omeprazole ('20MG'$  Capsule DR, Oral) Active. TraZODone HCl ('150MG'$  Tablet, Oral) Active. Atorvastatin Calcium ('10MG'$  Tablet, Oral) Active. RaNITidine HCl ('150MG'$  Capsule, Oral) Active. TraMADol HCl ('50MG'$  Tablet, Oral as needed) Active. Medications Reconciled  Social History Marjean Donna, CMA; 06/24/2015 9:43 AM) Tobacco use Former smoker.  Family History Marjean Donna, [place]; 06/24/2015 9:43 AM) Cancer Brother. Family history unknown First Degree Relatives Heart Disease Father. Heart disease in male family member before age 66    Review of Systems ([place]; 06/24/2015 9:43 AM) General Present- Fatigue. Not Present- Appetite Loss, Chills, Fever, Night Sweats, Weight Gain and Weight Loss. HEENT Not Present- Earache, Hearing Loss, Hoarseness, Nose Bleed, Oral Ulcers, Ringing in the Ears, Seasonal Allergies, Sinus Pain, Sore Throat, Visual Disturbances, Wears glasses/contact lenses and Yellow Eyes. Respiratory Not Present- Bloody sputum, Chronic Cough, Difficulty Breathing, Snoring and Wheezing. Cardiovascular Not Present- Chest Pain, Difficulty Breathing Lying Down, Leg Cramps, Palpitations, Rapid Heart Rate, Shortness of Breath and Swelling of Extremities. Gastrointestinal Not Present- Abdominal Pain, Bloating, Bloody Stool, Change in Bowel Habits, Chronic diarrhea, Constipation, Difficulty Swallowing, Excessive gas, Gets full quickly at meals, Hemorrhoids, Indigestion, Nausea, Rectal Pain and Vomiting. Male Genitourinary Not Present- Blood in Urine, Change in Urinary Stream, Frequency, Impotence, Nocturia, Painful Urination,  Urgency and Urine Leakage. Neurological Not Present- Decreased Memory, Fainting, Headaches, Numbness, Seizures, Tingling, Tremor, Trouble walking and Weakness. Psychiatric Not Present- Anxiety, Bipolar, Change in Sleep Pattern, Depression, Fearful and Frequent crying. Endocrine Not Present- Cold Intolerance,  Excessive Hunger, Hair Changes, Heat Intolerance, Hot flashes and New Diabetes. Hematology Not Present- Easy Bruising, Excessive bleeding, Gland problems, HIV and Persistent Infections.  Vitals (Sonya Bynum CMA; 06/24/2015 9:44 AM) 06/24/2015 9:43 AM Weight: 207 lb Height: 69in Body Surface Area: 2.1 m Body Mass Index: 30.57 kg/m  Temp.: 1F(Temporal)  Pulse: 76 (Regular)  BP: 130/72 (Sitting, Left Arm, Standard)       Assessment & Plan  RECURRENT UMBILICAL HERNIA (D53.2) INCISIONAL HERNIA, WITHOUT OBSTRUCTION OR GANGRENE (K43.2) Impression: Obvious incisional hernia in one of his robotic port sites in the right upper quadrant. About 10 cm away from the umbilical hernia as well.  I think he would benefit from repair. I would do a laparoscopic underlay mesh repair given his thin abdominal wall and obesity and recurrence with primary suturing. Did caution him about postoperative soreness but from a hemodynamic and cardiopulmonary risk, should be less risky than the nephrectomy. Patient wishes to have surgery. Dr. agrees. We'll work to coordinate convenient time   [place] [facility] [place] (Z01.818) Current Plans You are being scheduled for surgery - Our schedulers will call you.  You should hear from our office's scheduling department within 5 working days about the location, date, and time of surgery. We try to make accommodations for patient's preferences in scheduling surgery, but sometimes the OR schedule or the surgeon's schedule prevents [place] from making those accommodations.  If you have not heard from our office [phone]) in 5 working days,  call the office and ask for your surgeon's nurse.  If you have other questions about your diagnosis, plan, or surgery, call the office and ask for your surgeon's nurse.  Written instructions provided The anatomy & physiology of the abdominal wall was discussed. The pathophysiology of hernias was discussed. Natural history risks without surgery including progeressive enlargement, pain, incarceration, & strangulation was discussed. Contributors to complications such as smoking, obesity, diabetes, prior surgery, etc were discussed.  I feel the risks of no intervention will lead to serious problems that outweigh the operative risks; therefore, I recommended surgery to reduce and repair the hernia. I explained laparoscopic techniques with possible need for an open approach. I noted the probable use of mesh to patch and/or buttress the hernia repair  Risks such as bleeding, infection, abscess, need for further treatment, heart attack, death, and other risks were discussed. I noted a good likelihood this will help address the problem. Goals of post-operative recovery were discussed as well. Possibility that this will not correct all symptoms was explained. I stressed the importance of low-impact activity, aggressive pain control, avoiding constipation, & not pushing through pain to minimize risk of post-operative chronic pain or injury. Possibility of reherniation especially with smoking, obesity, diabetes, immunosuppression, and other health conditions was discussed. We will work to minimize complications.  An educational handout further explaining the pathology & treatment options was given as well. Questions were answered. The patient expresses understanding & wishes to proceed with surgery.  Pt Education - Pamphlet Given - Laparoscopic Hernia Repair: discussed with patient and provided information. Pt Education - [facility] Hernia Post-Op HCI (Astaria Nanez): discussed with patient and provided information. Pt  Education - [facility] Pain Control (Analiz Tvedt)   Signed by Adin Hector, MD   Adin Hector, M.D., F.A.C.S. Gastrointestinal and Minimally Invasive Surgery [facility] [address] [place], [place] [address] [phone] Main / Paging

## 2015-08-01 NOTE — Discharge Instructions (Signed)
HERNIA REPAIR: POST OP INSTRUCTIONS  ######################################################################  EAT Gradually transition to a high fiber diet with a fiber supplement over the next few weeks after discharge.  Start with a pureed / full liquid diet (see below)  WALK Walk an hour a day.  Control your pain to do that.    CONTROL PAIN Control pain so that you can walk, sleep, tolerate sneezing/coughing, go up/down stairs.  HAVE A BOWEL MOVEMENT DAILY Keep your bowels regular to avoid problems.  OK to try a laxative to override constipation.  OK to use an antidairrheal to slow down diarrhea.  Call if not better after 2 tries  CALL IF YOU HAVE PROBLEMS/CONCERNS Call if you are still struggling despite following these instructions. Call if you have concerns not answered by these instructions  ######################################################################    1. DIET: Follow a light bland diet the first 24 hours after arrival home, such as soup, liquids, crackers, etc.  Be sure to include lots of fluids daily.  Avoid fast food or heavy meals as your are more likely to get nauseated.  Eat a low fat the next few days after surgery. 2. Take your usually prescribed home medications unless otherwise directed. 3. PAIN CONTROL: a. Pain is best controlled by a usual combination of three different methods TOGETHER: i. Ice/Heat ii. Over the counter pain medication iii. Prescription pain medication b. Most patients will experience some swelling and bruising around the hernia(s) such as the bellybutton, groins, or old incisions.  Ice packs or heating pads (30-60 minutes up to 6 times a day) will help. Use ice for the first few days to help decrease swelling and bruising, then switch to heat to help relax tight/sore spots and speed recovery.  Some people prefer to use ice alone, heat alone, alternating between ice & heat.  Experiment to what works for you.  Swelling and bruising can take  several weeks to resolve.   c. It is helpful to take an over-the-counter pain medication regularly for the first few weeks.  Choose one of the following that works best for you: i. Naproxen (Aleve, etc)  Two 216m tabs twice a day ii. Ibuprofen (Advil, etc) Three 2028mtabs four times a day (every meal & bedtime) iii. Acetaminophen (Tylenol, etc) 325-6505mour times a day (every meal & bedtime) d. A  prescription for pain medication should be given to you upon discharge.  Take your pain medication as prescribed.  i. If you are having problems/concerns with the prescription medicine (does not control pain, nausea, vomiting, rash, itching, etc), please call us [phone] see if we need to switch you to a different pain medicine that will work better for you and/or control your side effect better. ii. If you need a refill on your pain medication, please contact your pharmacy.  They will contact our office to request authorization. Prescriptions will not be filled after 5 pm or on week-ends. 4. Avoid getting constipated.  Between the surgery and the pain medications, it is common to experience some constipation.  Increasing fluid intake and taking a fiber supplement (such as Metamucil, Citrucel, FiberCon, MiraLax, etc) 1-2 times a day regularly will usually help prevent this problem from occurring.  A mild laxative (prune juice, Milk of Magnesia, MiraLax, etc) should be taken according to package directions if there are no bowel movements after 48 hours.   5. Wash / shower every day.  You may shower over the dressings as they are waterproof.   6. Remove  your waterproof bandages 5 days after surgery.  You may leave the incision open to air.  You may replace a dressing/Band-Aid to cover the incision for comfort if you wish.  Continue to shower over incision(s) after the dressing is off. ° ° ° °7. ACTIVITIES as tolerated:   °a. You may resume regular (light) daily activities beginning the next day--such  as daily self-care, walking, climbing stairs--gradually increasing activities as tolerated.  If you can walk 30 minutes without difficulty, it is safe to try more intense activity such as jogging, treadmill, bicycling, low-impact aerobics, swimming, etc. °b. Save the most intensive and strenuous activity for last such as sit-ups, heavy lifting, contact sports, etc  Refrain from any heavy lifting or straining until you are off narcotics for pain control.   °c. DO NOT PUSH THROUGH PAIN.  Let pain be your guide: If it hurts to do something, don't do it.  Pain is your body warning you to avoid that activity for another week until the pain goes down. °d. You may drive when you are no longer taking prescription pain medication, you can comfortably wear a seatbelt, and you can safely maneuver your car and apply brakes. °e. You may have sexual intercourse when it is comfortable.  °8. FOLLOW UP in our office °a. Please call [facility] at [phone] to set up an appointment to see your surgeon in the office for a follow-up appointment approximately 2-3 weeks after your surgery. °b. Make sure that you call for this appointment the day you arrive home to insure a convenient appointment time. °9.  IF YOU HAVE DISABILITY OR FAMILY LEAVE FORMS, BRING THEM TO THE OFFICE FOR PROCESSING.  DO NOT GIVE THEM TO YOUR DOCTOR. ° °WHEN TO CALL US [phone]: °1. Poor pain control °2. Reactions / problems with new medications (rash/itching, nausea, etc)  °3. Fever over 101.5 F (38.5 C) °4. Inability to urinate °5. Nausea and/or vomiting °6. Worsening swelling or bruising °7. Continued bleeding from incision. °8. Increased pain, redness, or drainage from the incision ° ° The clinic staff is available to answer your questions during regular business hours (8:30am-5pm).  Please don’t hesitate to call and ask to speak to one of our nurses for clinical concerns.  ° If you have a medical emergency, go to the nearest emergency room or call  911. ° A surgeon from [facility] is always on call at the hospitals in [place] ° °[facility] °[address], [address], [place], [place]  [address] ? ° [address] [address], [place], [place]   [address] °MAIN: [phone] ? TOLL FREE: [phone] ? FAX: [phone] °[email or web address] ° ° °

## 2015-08-01 NOTE — Anesthesia Procedure Notes (Signed)
Procedure Name: Intubation Date/Time: 08/01/2015 7:37 AM Performed by: Montel Clock Pre-anesthesia Checklist: Patient identified, Emergency Drugs available, Suction available, Patient being monitored and Timeout performed Patient Re-evaluated:Patient Re-evaluated prior to inductionOxygen Delivery Method: Circle system utilized Preoxygenation: Pre-oxygenation with 100% oxygen Intubation Type: IV induction and Rapid sequence Laryngoscope Size: Mac and 3 Grade View: Grade I Tube type: Oral Tube size: 7.5 mm Number of attempts: 1 Airway Equipment and Method: Stylet Placement Confirmation: ETT inserted through vocal cords under direct vision,  positive ETCO2 and breath sounds checked- equal and bilateral Secured at: 23 cm Tube secured with: Tape Dental Injury: Teeth and Oropharynx as per pre-operative assessment

## 2015-08-01 NOTE — H&P (View-Only) (Signed)
I have reviewed the tests.  Stable elevated Cr.  There are no major concerns.  Okay to proceed with surgery from my standpoint.  Defer to anesthesia

## 2015-08-01 NOTE — Interval H&P Note (Signed)
History and Physical Interval Note:  08/01/2015 7:19 AM  [name]  has presented today for surgery, with the diagnosis of Incisional and recurrent umbilical hernia   The various methods of treatment have been discussed with the patient and family. After consideration of risks, benefits and other options for treatment, the patient has consented to  Procedure(s): LAPAROSCOPIC VENTRAL WALL HERNIA (N/A) LAPAROSCOPIC LYSIS OF ADHESIONS (N/A) as a surgical intervention .  The patient's history has been reviewed, patient examined, no change in status, stable for surgery.  I have reviewed the patient's chart and labs.  Questions were answered to the patient's satisfaction.     Runette Scifres C.

## 2015-08-01 NOTE — Transfer of Care (Signed)
Immediate Anesthesia Transfer of Care Note  Patient: [name]  Procedure(s) Performed: Procedure(s): LAPAROSCOPIC VENTRAL WALL HERNIA WITH MESH (N/A) LAPAROSCOPIC LYSIS OF ADHESIONS (N/A)  Patient Location: PACU  Anesthesia Type:General  Level of Consciousness:  alert, patient cooperative and responds to stimulation  Airway & Oxygen Therapy:Patient Spontanous Breathing and Patient connected to face mask oxgen  Post-op Assessment:  Report given to PACU RN and Post -op Vital signs reviewed and stable  Post vital signs:  Reviewed and stable  Last Vitals:  Filed Vitals:   08/01/15 0553  BP: 141/79  Pulse: 73  Temp: 36.6 C  Resp: 18    Complications: No apparent anesthesia complications

## 2015-08-01 NOTE — Anesthesia Postprocedure Evaluation (Signed)
Anesthesia Post Note  Patient: [name]  Procedure(s) Performed: Procedure(s) (LRB): LAPAROSCOPIC VENTRAL WALL HERNIA WITH MESH (N/A) LAPAROSCOPIC LYSIS OF ADHESIONS (N/A)  Patient location during evaluation: PACU Anesthesia Type: General Level of consciousness: sedated and patient cooperative Pain management: pain level controlled Vital Signs Assessment: post-procedure vital signs reviewed and stable Respiratory status: spontaneous breathing Cardiovascular status: stable Anesthetic complications: no    Last Vitals:  Filed Vitals:   08/01/15 1131 08/01/15 1205  BP: 135/65 142/73  Pulse: 86 86  Temp: 36.6 C 36.6 C  Resp: 15 16    Last Pain:  Filed Vitals:   08/01/15 1207  PainSc: 2                  Nolon Nations

## 2015-08-01 NOTE — Op Note (Signed)
08/01/2015  9:13 AM  PATIENT:  [name]  70 y.o. male  Patient Care Team: [name], MD as PCP - General (Family Medicine) Michael Boston, MD as Consulting Physician (General Surgery) Ardis Hughs, MD as Consulting Physician (Urology)  PRE-OPERATIVE DIAGNOSIS:  Incisional and recurrent umbilical hernia   POST-OPERATIVE DIAGNOSIS:  Incisional and recurrent umbilical hernia   PROCEDURE:  Procedure(s): LAPAROSCOPIC VENTRAL WALL HERNIA WITH MESH LAPAROSCOPIC LYSIS OF ADHESIONS  SURGEON:  Surgeon(s): Michael Boston, MD  ASSISTANT: RN   ANESTHESIA:     General Local anesthesia field block: (0.25% bupivacaine & liposomal  Bupivacaine [Experel])   EBL:     Delay start of Pharmacological VTE agent (>24hrs) due to surgical blood loss or risk of bleeding:  no  DRAINS: none   SPECIMEN:  No Specimen  DISPOSITION OF SPECIMEN:  N/A  COUNTS:  YES  PLAN OF CARE: Discharge to home after PACU  PATIENT DISPOSITION:  PACU - hemodynamically stable.  INDICATION: Pleasant patient has developed a ventral wall abdominal hernia.   Recommendation was made for surgical repair:  The anatomy & physiology of the abdominal wall was discussed. The pathophysiology of hernias was discussed. Natural history risks without surgery including progeressive enlargement, pain, incarceration & strangulation was discussed. Contributors to complications such as smoking, obesity, diabetes, prior surgery, etc were discussed.  I feel the risks of no intervention will lead to serious problems that outweigh the operative risks; therefore, I recommended surgery to reduce and repair the hernia. I explained laparoscopic techniques with possible need for an open approach. I noted the probable use of mesh to patch and/or buttress the hernia repair  Risks such as bleeding, infection, abscess, need for further treatment, heart attack, death, and other risks were discussed. I noted a good likelihood this will  help address the problem. Goals of post-operative recovery were discussed as well. Possibility that this will not correct all symptoms was explained. I stressed the importance of low-impact activity, aggressive pain control, avoiding constipation, & not pushing through pain to minimize risk of post-operative chronic pain or injury. Possibility of reherniation especially with smoking, obesity, diabetes, immunosuppression, and other health conditions was discussed. We will work to minimize complications.  An educational handout further explaining the pathology & treatment options was given as well. Questions were answered. The patient expresses understanding & wishes to proceed with surgery.   OR FINDINGS: 3.5 x 3 cm RUQ incisional hernia.  1.5 cm umbilical hernia.  12 x 4cm region   Type of repair - Laparoscopic underlay repair   Name of mesh - Bard Ventralight dual sided (polypropylene / Seprafilm)  Size of mesh - Height 20 cm, Width 15 cm  Orientation:  Oblique  Mesh overlap - 5-7 cm  Placement of mesh - Intraperitoneal underlay repair   DESCRIPTION:   Informed consent was confirmed. The patient underwent general anaesthesia without difficulty. The patient was positioned appropriately. VTE prevention in place. The patient's abdomen was clipped, prepped, & draped in a sterile fashion. Surgical timeout confirmed our plan.  The patient was positioned in reverse Trendelenburg. Abdominal entry was gained using optical entry technique in the left upper abdomen. Entry was clean. I induced carbon dioxide insufflation. Camera inspection revealed no injury. Extra ports were carefully placed under direct laparoscopic visualization.   I could see omentum covering most of the upper abdomen.  I saw a hernia in the RUQ abdomen.  Another one at the umbilicus.  I did laparoscopic lysis of adhesions to expose  the entire anterior abdominal wall.  I primarily used and focused cold scissors.    I made sure  hemostasis was good.  I mapped out the region using a needle passer.   To ensure that I would have at least 5 cm radial coverage outside of the hernia defect, I chose a 20x15 cm dual sided mesh.  I placed #1 Prolene stitches around its edge about every 5 cm = 10 total.  I rolled the mesh & placed into the peritoneal cavity through the incisional hernia fascial defect.  I unrolled the mesh and positioned it appropriately.  I secured the mesh to cover up the hernia defect using a laparoscopic suture passer to pass the tails of the Prolene through the abdominal wall & tagged them with clamps.  I started out in four corners to make sure I had the mesh centered under the hernia defect appropriately, and then proceeded to work in quadrants.  We evacuated CO2 & desufflated the abdomen.  I tied the fascial stitches down.  I reinsufflated the abdomen.  The mesh provided at least 5-10 cm circumferential coverage around the entire region of hernia defects.   I tacked the edges & central part of the mesh to the peritoneum/posterior rectus fascia with SecureStrap absorbable tacks.  Another #1 prolene placed through the center of the mesh.  Incisional hernia repaired primarily with #1 PDS interrupted suture.  Umbilical hernia primarily closed as well.   A good field block of local anesthesia was used at fascial stitch sites & fascial closure areas.  Hemostasis was excellent.  I did reinspection. Hemostasis was good. Mesh laid well. Capnoperitoneum was evacuated. Ports were removed. The skin was closed with Monocryl at the port sites and Steri-Strips on the fascial stitch puncture sites.  Patient is being extubated to go to the recovery room. I'm about to discuss operative findings with the patient's family.   Adin Hector, M.D., F.A.C.S. Gastrointestinal and Minimally Invasive Surgery [facility] [address] [place],  [address] [phone] Main / Paging

## 2015-08-19 ENCOUNTER — Other Ambulatory Visit: Payer: Self-pay | Admitting: Urology

## 2015-08-19 ENCOUNTER — Ambulatory Visit (HOSPITAL_COMMUNITY)
Admission: RE | Admit: 2015-08-19 | Discharge: 2015-08-19 | Disposition: A | Discharge status: Home / Self Care | Payer: Medicare Other | Source: Ambulatory Visit | Attending: Urology | Admitting: Urology

## 2015-08-19 DIAGNOSIS — C642 Malignant neoplasm of left kidney, except renal pelvis: Secondary | ICD-10-CM | POA: Diagnosis not present

## 2016-03-23 DIAGNOSIS — L219 Seborrheic dermatitis, unspecified: Secondary | ICD-10-CM | POA: Diagnosis not present

## 2016-03-23 DIAGNOSIS — L0201 Cutaneous abscess of face: Secondary | ICD-10-CM | POA: Diagnosis not present

## 2016-03-23 DIAGNOSIS — L570 Actinic keratosis: Secondary | ICD-10-CM | POA: Diagnosis not present

## 2016-04-29 DIAGNOSIS — I10 Essential (primary) hypertension: Secondary | ICD-10-CM | POA: Diagnosis not present

## 2016-04-29 DIAGNOSIS — G4700 Insomnia, unspecified: Secondary | ICD-10-CM | POA: Diagnosis not present

## 2016-04-29 DIAGNOSIS — R7301 Impaired fasting glucose: Secondary | ICD-10-CM | POA: Diagnosis not present

## 2016-05-26 DIAGNOSIS — R002 Palpitations: Secondary | ICD-10-CM | POA: Diagnosis not present

## 2016-05-26 DIAGNOSIS — I493 Ventricular premature depolarization: Secondary | ICD-10-CM | POA: Diagnosis not present

## 2016-05-26 DIAGNOSIS — E668 Other obesity: Secondary | ICD-10-CM | POA: Diagnosis not present

## 2016-05-26 DIAGNOSIS — I10 Essential (primary) hypertension: Secondary | ICD-10-CM | POA: Diagnosis not present

## 2016-05-26 DIAGNOSIS — I2510 Atherosclerotic heart disease of native coronary artery without angina pectoris: Secondary | ICD-10-CM | POA: Diagnosis not present

## 2016-05-26 DIAGNOSIS — E785 Hyperlipidemia, unspecified: Secondary | ICD-10-CM | POA: Diagnosis not present

## 2016-05-26 DIAGNOSIS — Z85118 Personal history of other malignant neoplasm of bronchus and lung: Secondary | ICD-10-CM | POA: Diagnosis not present

## 2016-05-28 DIAGNOSIS — R0789 Other chest pain: Secondary | ICD-10-CM | POA: Diagnosis not present

## 2016-05-28 DIAGNOSIS — I25119 Atherosclerotic heart disease of native coronary artery with unspecified angina pectoris: Secondary | ICD-10-CM | POA: Diagnosis not present

## 2016-05-28 DIAGNOSIS — E668 Other obesity: Secondary | ICD-10-CM | POA: Diagnosis not present

## 2016-05-28 DIAGNOSIS — E785 Hyperlipidemia, unspecified: Secondary | ICD-10-CM | POA: Diagnosis not present

## 2016-05-28 DIAGNOSIS — Z85118 Personal history of other malignant neoplasm of bronchus and lung: Secondary | ICD-10-CM | POA: Diagnosis not present

## 2016-05-28 DIAGNOSIS — I10 Essential (primary) hypertension: Secondary | ICD-10-CM | POA: Diagnosis not present

## 2016-05-28 DIAGNOSIS — I493 Ventricular premature depolarization: Secondary | ICD-10-CM | POA: Diagnosis not present

## 2016-06-20 DIAGNOSIS — L03317 Cellulitis of buttock: Secondary | ICD-10-CM | POA: Diagnosis not present

## 2016-12-04 ENCOUNTER — Encounter: Payer: Self-pay | Admitting: Cardiology

## 2016-12-04 DIAGNOSIS — Z85118 Personal history of other malignant neoplasm of bronchus and lung: Secondary | ICD-10-CM | POA: Diagnosis not present

## 2016-12-04 DIAGNOSIS — I2510 Atherosclerotic heart disease of native coronary artery without angina pectoris: Secondary | ICD-10-CM | POA: Diagnosis not present

## 2016-12-04 DIAGNOSIS — E785 Hyperlipidemia, unspecified: Secondary | ICD-10-CM

## 2016-12-04 DIAGNOSIS — R0789 Other chest pain: Secondary | ICD-10-CM | POA: Diagnosis not present

## 2016-12-04 DIAGNOSIS — E7849 Other hyperlipidemia: Secondary | ICD-10-CM | POA: Diagnosis not present

## 2016-12-04 DIAGNOSIS — I493 Ventricular premature depolarization: Secondary | ICD-10-CM | POA: Diagnosis not present

## 2016-12-04 DIAGNOSIS — E668 Other obesity: Secondary | ICD-10-CM | POA: Diagnosis not present

## 2016-12-04 DIAGNOSIS — I10 Essential (primary) hypertension: Secondary | ICD-10-CM | POA: Diagnosis not present

## 2016-12-04 DIAGNOSIS — I119 Hypertensive heart disease without heart failure: Secondary | ICD-10-CM | POA: Diagnosis not present

## 2016-12-12 IMAGING — DX DG CHEST 2V
2 series · 2 of 2 positions shown · non-contrast
Comparison: 10/26/2014 chest radiograph.

CLINICAL DATA: Left renal malignancy.

EXAM:
CHEST  2 VIEW

[chest pa]
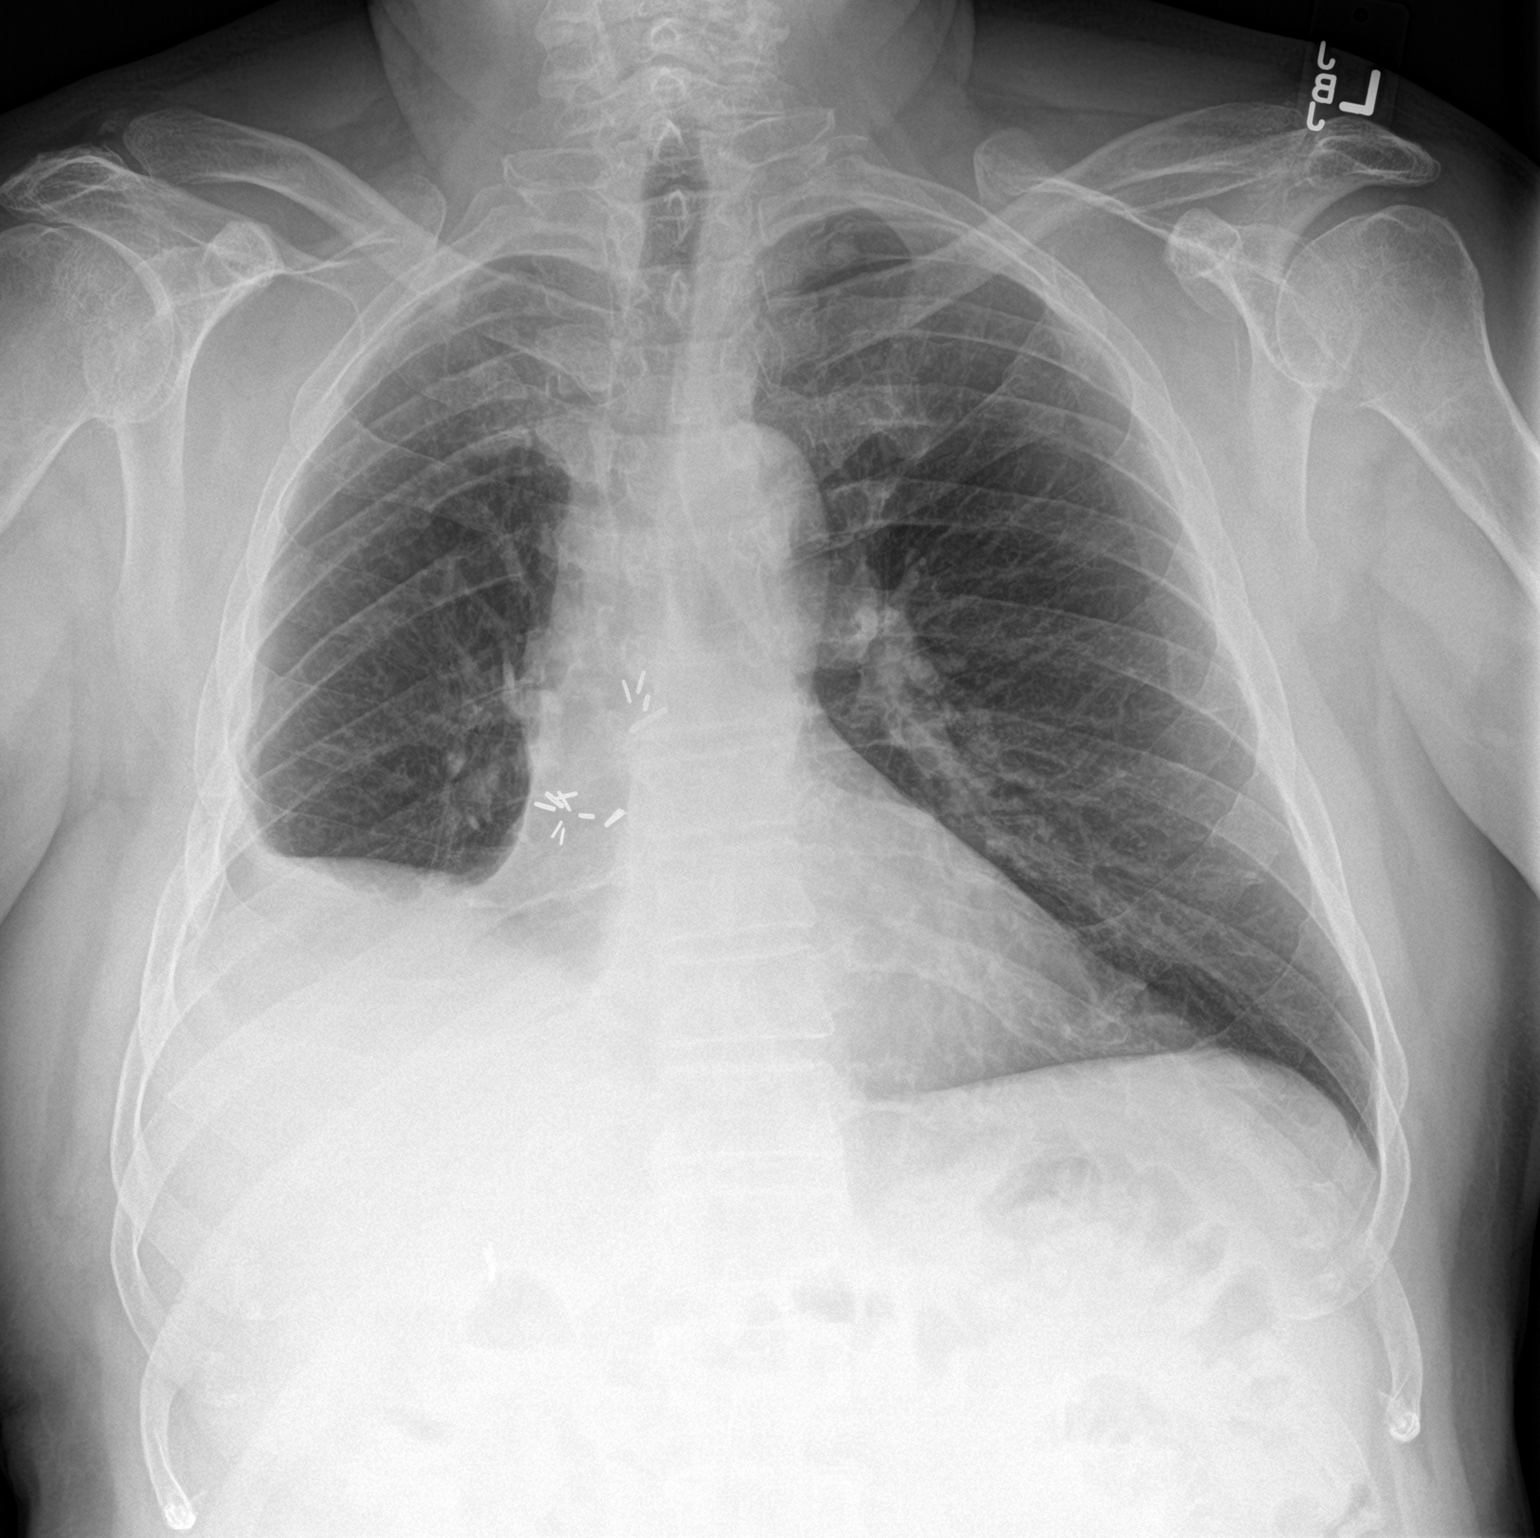

[chest lat]
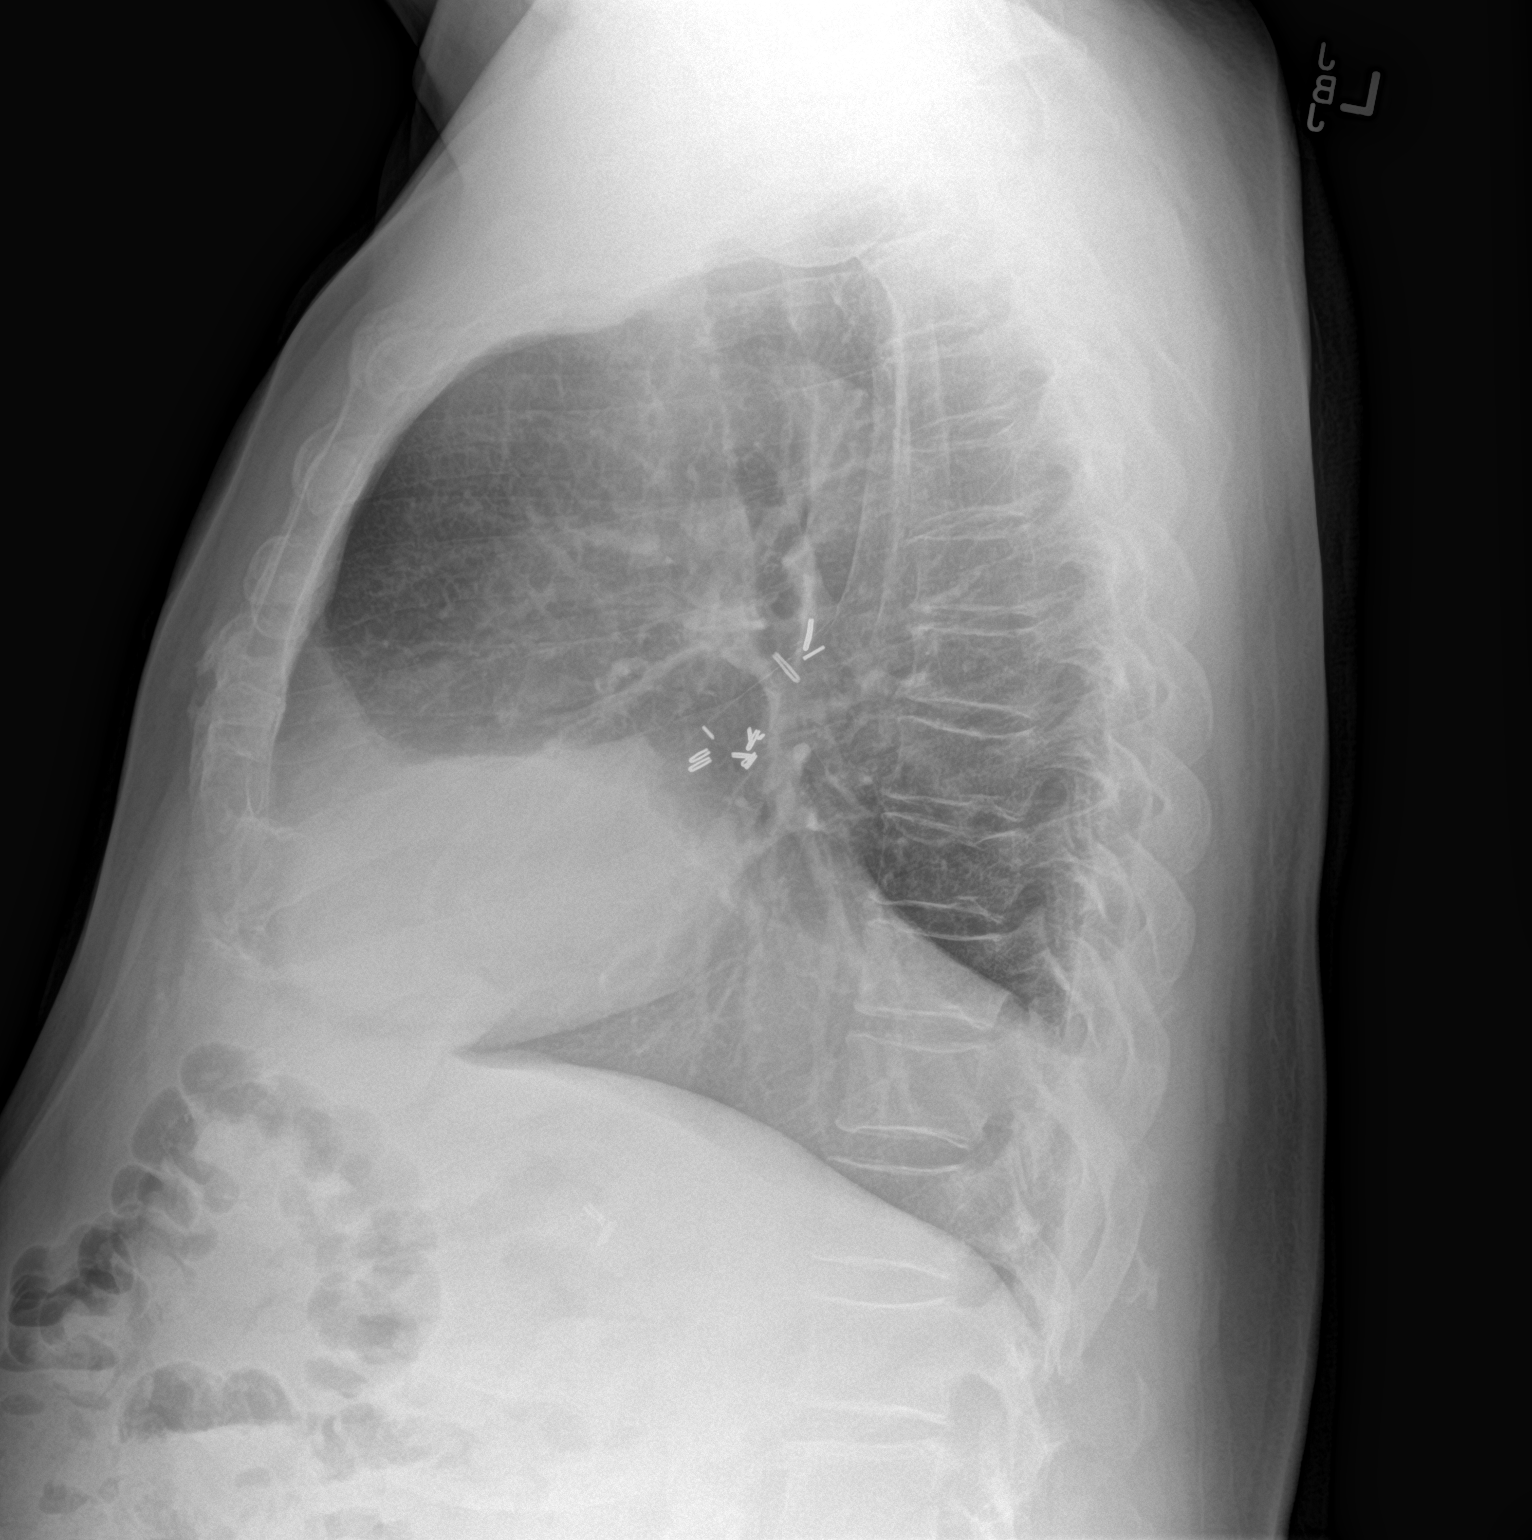

[2 of 2 positions shown; findings below may reference images not displayed]

FINDINGS: Right hilar surgical clips. Stable cardiomediastinal silhouette with
normal heart size. No pneumothorax. Stable volume loss in the right
hemithorax with pleural parenchymal scarring at the peripheral right
lung base. No definite pleural effusion. No pulmonary edema. No
acute consolidative airspace disease. Surgical clips in the right
upper abdomen.
IMPRESSION: Stable postsurgical changes in the right hemithorax. No active
cardiopulmonary disease.

## 2016-12-21 DIAGNOSIS — H5203 Hypermetropia, bilateral: Secondary | ICD-10-CM | POA: Diagnosis not present

## 2016-12-21 DIAGNOSIS — H52223 Regular astigmatism, bilateral: Secondary | ICD-10-CM | POA: Diagnosis not present

## 2016-12-21 DIAGNOSIS — H25813 Combined forms of age-related cataract, bilateral: Secondary | ICD-10-CM | POA: Diagnosis not present

## 2016-12-21 DIAGNOSIS — H524 Presbyopia: Secondary | ICD-10-CM | POA: Diagnosis not present

## 2017-01-14 DIAGNOSIS — Z23 Encounter for immunization: Secondary | ICD-10-CM | POA: Diagnosis not present

## 2017-01-14 DIAGNOSIS — I10 Essential (primary) hypertension: Secondary | ICD-10-CM | POA: Diagnosis not present

## 2017-02-16 DIAGNOSIS — C7800 Secondary malignant neoplasm of unspecified lung: Secondary | ICD-10-CM

## 2017-02-16 HISTORY — DX: Secondary malignant neoplasm of unspecified lung: C78.00

## 2017-02-16 HISTORY — PX: EYE SURGERY: SHX253

## 2017-03-16 DIAGNOSIS — H57813 Brow ptosis, bilateral: Secondary | ICD-10-CM | POA: Diagnosis not present

## 2017-03-16 DIAGNOSIS — H2513 Age-related nuclear cataract, bilateral: Secondary | ICD-10-CM | POA: Diagnosis not present

## 2017-03-23 DIAGNOSIS — D485 Neoplasm of uncertain behavior of skin: Secondary | ICD-10-CM | POA: Diagnosis not present

## 2017-03-23 DIAGNOSIS — L570 Actinic keratosis: Secondary | ICD-10-CM | POA: Diagnosis not present

## 2017-03-26 DIAGNOSIS — J209 Acute bronchitis, unspecified: Secondary | ICD-10-CM | POA: Diagnosis not present

## 2017-04-06 DIAGNOSIS — Z01812 Encounter for preprocedural laboratory examination: Secondary | ICD-10-CM | POA: Diagnosis not present

## 2017-04-06 DIAGNOSIS — Z01810 Encounter for preprocedural cardiovascular examination: Secondary | ICD-10-CM | POA: Diagnosis not present

## 2017-04-12 DIAGNOSIS — H2512 Age-related nuclear cataract, left eye: Secondary | ICD-10-CM | POA: Diagnosis not present

## 2017-04-12 DIAGNOSIS — E669 Obesity, unspecified: Secondary | ICD-10-CM | POA: Diagnosis not present

## 2017-04-12 DIAGNOSIS — J449 Chronic obstructive pulmonary disease, unspecified: Secondary | ICD-10-CM | POA: Diagnosis not present

## 2017-04-12 DIAGNOSIS — H2511 Age-related nuclear cataract, right eye: Secondary | ICD-10-CM | POA: Diagnosis not present

## 2017-04-12 DIAGNOSIS — I10 Essential (primary) hypertension: Secondary | ICD-10-CM | POA: Diagnosis not present

## 2017-04-26 DIAGNOSIS — E669 Obesity, unspecified: Secondary | ICD-10-CM | POA: Diagnosis not present

## 2017-04-26 DIAGNOSIS — H2511 Age-related nuclear cataract, right eye: Secondary | ICD-10-CM | POA: Diagnosis not present

## 2017-04-26 DIAGNOSIS — I10 Essential (primary) hypertension: Secondary | ICD-10-CM | POA: Diagnosis not present

## 2017-04-26 DIAGNOSIS — J449 Chronic obstructive pulmonary disease, unspecified: Secondary | ICD-10-CM | POA: Diagnosis not present

## 2017-04-26 DIAGNOSIS — K219 Gastro-esophageal reflux disease without esophagitis: Secondary | ICD-10-CM | POA: Diagnosis not present

## 2017-04-26 DIAGNOSIS — H2512 Age-related nuclear cataract, left eye: Secondary | ICD-10-CM | POA: Diagnosis not present

## 2017-07-15 DIAGNOSIS — N189 Chronic kidney disease, unspecified: Secondary | ICD-10-CM | POA: Diagnosis not present

## 2017-07-15 DIAGNOSIS — R109 Unspecified abdominal pain: Secondary | ICD-10-CM | POA: Diagnosis not present

## 2017-07-22 ENCOUNTER — Other Ambulatory Visit: Payer: Self-pay | Admitting: Family Medicine

## 2017-07-22 DIAGNOSIS — R1013 Epigastric pain: Secondary | ICD-10-CM

## 2017-08-03 ENCOUNTER — Ambulatory Visit
Admission: RE | Admit: 2017-08-03 | Discharge: 2017-08-03 | Disposition: A | Discharge status: Home / Self Care | Payer: Medicare HMO | Source: Ambulatory Visit | Attending: Family Medicine | Admitting: Family Medicine

## 2017-08-03 DIAGNOSIS — R1013 Epigastric pain: Secondary | ICD-10-CM

## 2017-08-03 DIAGNOSIS — Z85528 Personal history of other malignant neoplasm of kidney: Secondary | ICD-10-CM | POA: Diagnosis not present

## 2017-08-03 DIAGNOSIS — C786 Secondary malignant neoplasm of retroperitoneum and peritoneum: Secondary | ICD-10-CM | POA: Diagnosis not present

## 2017-08-03 MED ORDER — IOPAMIDOL (ISOVUE-300) INJECTION 61%
125.0000 mL | Freq: Once | INTRAVENOUS | Status: AC | PRN
  Administered 2017-08-03: 75 mL via INTRAVENOUS

## 2017-08-25 ENCOUNTER — Telehealth: Payer: Self-pay | Admitting: Oncology

## 2017-08-25 NOTE — Telephone Encounter (Signed)
Spoke to the pt's daughter to schedule [name] to see Dr. Alen Blew on 7/18 at 11am. She is aware her father should arrive 30 minutes early. Letter mailed.

## 2017-09-02 ENCOUNTER — Inpatient Hospital Stay: Payer: Medicare HMO | Attending: Oncology | Admitting: Oncology

## 2017-09-02 ENCOUNTER — Telehealth: Payer: Self-pay | Admitting: Oncology

## 2017-09-02 VITALS — BP 137/84 | HR 64 | Temp 97.9°F | Resp 17 | Ht 68.0 in | Wt 211.2 lb

## 2017-09-02 DIAGNOSIS — R109 Unspecified abdominal pain: Secondary | ICD-10-CM | POA: Diagnosis not present

## 2017-09-02 DIAGNOSIS — C787 Secondary malignant neoplasm of liver and intrahepatic bile duct: Secondary | ICD-10-CM | POA: Insufficient documentation

## 2017-09-02 DIAGNOSIS — Z905 Acquired absence of kidney: Secondary | ICD-10-CM | POA: Diagnosis not present

## 2017-09-02 DIAGNOSIS — R911 Solitary pulmonary nodule: Secondary | ICD-10-CM | POA: Diagnosis not present

## 2017-09-02 DIAGNOSIS — Z85528 Personal history of other malignant neoplasm of kidney: Secondary | ICD-10-CM | POA: Diagnosis not present

## 2017-09-02 DIAGNOSIS — Z801 Family history of malignant neoplasm of trachea, bronchus and lung: Secondary | ICD-10-CM | POA: Insufficient documentation

## 2017-09-02 DIAGNOSIS — C786 Secondary malignant neoplasm of retroperitoneum and peritoneum: Secondary | ICD-10-CM | POA: Diagnosis not present

## 2017-09-02 DIAGNOSIS — M7989 Other specified soft tissue disorders: Secondary | ICD-10-CM

## 2017-09-02 DIAGNOSIS — C649 Malignant neoplasm of unspecified kidney, except renal pelvis: Secondary | ICD-10-CM | POA: Insufficient documentation

## 2017-09-02 DIAGNOSIS — R591 Generalized enlarged lymph nodes: Secondary | ICD-10-CM | POA: Insufficient documentation

## 2017-09-02 DIAGNOSIS — I10 Essential (primary) hypertension: Secondary | ICD-10-CM | POA: Diagnosis not present

## 2017-09-02 DIAGNOSIS — C641 Malignant neoplasm of right kidney, except renal pelvis: Secondary | ICD-10-CM

## 2017-09-02 DIAGNOSIS — K769 Liver disease, unspecified: Secondary | ICD-10-CM | POA: Diagnosis not present

## 2017-09-02 DIAGNOSIS — Z87891 Personal history of nicotine dependence: Secondary | ICD-10-CM | POA: Diagnosis not present

## 2017-09-02 NOTE — Progress Notes (Signed)
Reason for the request: Kidney cancer  HPI: I was asked by Dr. Arelia Sneddon to evaluate [name] for kidney cancer.  This is a pleasant 72 year old man with history of GERD, coronary disease and hypertension.  He was diagnosed with renal cell carcinoma in 2016 and was found to have right renal mass after presenting with left lower quadrant pain.  He underwent a radical nephrectomy completed by Dr. Louis Meckel on January 31, 2018.  The final pathology revealed a 3.3 cm clear cell renal cell carcinoma with Fuhrman grade 3.  Margins were negative indicating stage T1 a disease.  He has been doing reasonably well until he started developing epigastric abdominal pain and May 2019 and underwent a CT scan of the abdomen and pelvis on August 03, 2017.  The CT scan showed 4.1 x 2.8 x 2.9 cm mass at the left lateral wall of the IVC letter presents tumor recurrence versus nodal metastasis.  There is also 4.2 x 2.7 x 4.6 cm mass compatible with peritoneal metastasis. Left lower pulmonary nodule was also detected in addition to 10 mm lateral right lobe liver nodule concerning for hepatic metastasis.  Clinically, he continues to have issues with abdominal discomfort.  His pain is in the epigastric region and not associated with any nausea vomiting or change in his bowel habits.  His pain is dull and not radiating.  No relieving or exacerbating symptoms.   He does not report any headaches, blurry vision, syncope or seizures. Does not report any fevers, chills or sweats.  Does not report any cough, wheezing or hemoptysis.  Does not report any chest pain, palpitation, orthopnea or leg edema.  Does not report any nausea, vomiting or abdominal pain.  Does not report any constipation or diarrhea.  Does not report any skeletal complaints.    Does not report frequency, urgency or hematuria.  Does not report any skin rashes or lesions. Does not report any heat or cold intolerance.  Does not report any lymphadenopathy or petechiae.  Does not  report any anxiety or depression.  Remaining review of systems is negative.    Past Medical History:  Diagnosis Date  . Arthritis   . Chronic kidney disease    only has one kidney   . 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 02/01/2015  . Coronary artery disease    followed by Dr.Tilley  . GERD (gastroesophageal reflux disease)   . Heart murmur   . High cholesterol   . Hx of cancer of lung 1980's  . Hypertension   . Incisional hernia 08/01/2015  . Lung cancer ([place])   . Recurrent umbilical hernia 5/39/7673  . Right renal mass   :  Past Surgical History:  Procedure Laterality Date  . APPENDECTOMY  age 30  . CHOLECYSTECTOMY  1980's  . LAPAROSCOPIC LYSIS OF ADHESIONS N/A 08/01/2015   Procedure: LAPAROSCOPIC LYSIS OF ADHESIONS;  Surgeon: Michael Boston, MD;  Location: [facility];  Service: General;  Laterality: N/A;  . LUNG LOBECTOMY  1992   lung cancer- patient has staples in lung not to have MRI per patient   . PILONIDAL CYST EXCISION  2011   Dr Harlow Asa  . ROBOTIC ASSITED PARTIAL NEPHRECTOMY Right 02/01/2015   Procedure: RIGHT ROBOTIC ASSISTED LAPAROSOCOPY NEPHRECTOMY;  Surgeon: Ardis Hughs, MD;  Location: [facility];  Service: Urology;  Laterality: Right;  . VENTRAL HERNIA REPAIR N/A 08/01/2015   Procedure: LAPAROSCOPIC VENTRAL WALL HERNIA WITH MESH;  Surgeon: Michael Boston, MD;  Location: [facility];  Service: General;  Laterality: N/A;  :   Current Outpatient Medications:  .  acetaminophen (TYLENOL) 500 MG tablet, Take 1,000 mg by mouth daily. , Disp: , Rfl:  .  amLODipine (NORVASC) 5 MG tablet, Take 5 mg by mouth daily., Disp: , Rfl:  .  aspirin 81 MG chewable tablet, Chew 81 mg by mouth daily., Disp: , Rfl:  .  lisinopril-hydrochlorothiazide (PRINZIDE,ZESTORETIC) 20-25 MG tablet, Take 1 tablet by mouth daily., Disp: , Rfl:  .  omeprazole (PRILOSEC) 20 MG capsule, Take 20 mg by mouth daily before breakfast. Pt states he is taking Zantac instead, Disp: , Rfl:   .  oxyCODONE (OXY IR/ROXICODONE) 5 MG immediate release tablet, Take 1-2 tablets (5-10 mg total) by mouth every 4 (four) hours as needed for moderate pain, severe pain or breakthrough pain., Disp: 40 tablet, Rfl: 0 .  ranitidine (ZANTAC) 150 MG capsule, Take 150 mg by mouth daily., Disp: , Rfl:  .  tetrahydrozoline 0.05 % ophthalmic solution, Place 1 drop into both eyes 4 (four) times daily as needed (For eye redness.)., Disp: , Rfl:  .  traZODone (DESYREL) 150 MG tablet, Take 150 mg by mouth at bedtime., Disp: , Rfl: :  No Known Allergies:  No family history on file.:  Social History   Socioeconomic History  . Marital status: Married    Spouse name: Not on file  . Number of children: Not on file  . Years of education: Not on file  . Highest education level: Not on file  Occupational History  . Not on file  Social Needs  . Financial resource strain: Not on file  . Food insecurity:    Worry: Not on file    Inability: Not on file  . Transportation needs:    Medical: Not on file    Non-medical: Not on file  Tobacco Use  . Smoking status: Former Smoker    Last attempt to quit: 01/29/1988    Years since quitting: 29.6  . Smokeless tobacco: Never Used  Substance and Sexual Activity  . Alcohol use: Yes    Comment: rare  . Drug use: No  . Sexual activity: Not on file  Lifestyle  . Physical activity:    Days per week: Not on file    Minutes per session: Not on file  . Stress: Not on file  Relationships  . Social connections:    Talks on phone: Not on file    Gets together: Not on file    Attends religious service: Not on file    Active member of club or organization: Not on file    Attends meetings of clubs or organizations: Not on file    Relationship status: Not on file  . Intimate partner violence:    Fear of current or ex partner: Not on file    Emotionally abused: Not on file    Physically abused: Not on file    Forced sexual activity: Not on file  Other Topics  Concern  . Not on file  Social History Narrative  . Not on file  :  Pertinent items are noted in HPI.  Exam: Blood pressure 137/84, pulse 64, temperature 97.9 F (36.6 C), temperature source Oral, resp. rate 17, height 5\' 8"  (1.727 m), weight 211 lb 3.2 oz (95.8 kg), SpO2 95 %.   ECOG 0 General appearance: alert and cooperative appeared without distress. Head: atraumatic without any abnormalities. Eyes: conjunctivae/corneas clear. PERRL.  Sclera anicteric. Throat: lips, mucosa, and  tongue normal; without oral thrush or ulcers. Resp: clear to auscultation bilaterally without rhonchi, wheezes or dullness to percussion. Cardio: regular rate and rhythm, S1, S2 normal, no murmur, click, rub or gallop GI: soft, non-tender; bowel sounds normal; no masses,  no organomegaly Skin: Skin color, texture, turgor normal. No rashes or lesions Lymph nodes: Cervical, supraclavicular, and axillary nodes normal. Neurologic: Grossly normal without any motor, sensory or deep tendon reflexes. Musculoskeletal: No joint deformity or effusion.  CBC    Component Value Date/Time   WBC 8.0 07/29/2015 0915   RBC 4.56 07/29/2015 0915   HGB 13.7 07/29/2015 0915   HCT 40.2 07/29/2015 0915   PLT 207 07/29/2015 0915   MCV 88.2 07/29/2015 0915   MCH 30.0 07/29/2015 0915   MCHC 34.1 07/29/2015 0915   RDW 12.9 07/29/2015 0915   LYMPHSABS 1.7 11/12/2009 1000   MONOABS 0.7 11/12/2009 1000   EOSABS 0.2 11/12/2009 1000   BASOSABS 0.0 11/12/2009 1000     Chemistry      Component Value Date/Time   NA 139 07/29/2015 0915   K 5.1 07/29/2015 0915   CL 103 07/29/2015 0915   CO2 30 07/29/2015 0915   BUN 28 (H) 07/29/2015 0915   CREATININE 1.64 (H) 07/29/2015 0915      Component Value Date/Time   CALCIUM 9.4 07/29/2015 0915   ALKPHOS 42 01/29/2015 0930   AST 21 01/29/2015 0930   ALT 23 01/29/2015 0930   BILITOT 0.7 01/29/2015 0930      IMPRESSION: New LEFT lower lobe and lingular pulmonary nodules  consistent with metastases.  Tumor recurrence versus metastatic lymph node adjacent to IVC with evidence of IVC invasion.  Peritoneal metastasis LEFT anterior mid abdomen.  Distal colonic diverticulosis without evidence of diverticulitis.  Stable nonspecific anterior LEFT lobe liver lesion with a new hypervascular lesion at the posterior RIGHT lobe liver 3.1 x 3.2 x 3.7 cm and hypervascular 10 mm lateral RIGHT lobe liver nodule highly concerning for hepatic metastases.  These results will be called to the ordering clinician or representative by the Radiologist Assistant, and communication documented in the PACS or zVision Dashboard.  Assessment and Plan:   72 year old man with the following issues:  1.  Abdominal CT findings that are suspicious for recurrent malignancy.  He had a CT scan obtained on 08/03/2017 which showed peritoneal metastasis the abdominal wall measuring 4.2 x 2.7 x 4.6 cm as well as a soft tissue mass measuring 4.1 x 2.8 x 2.9 cm of the left lateral wall of the IVC.  He has also pulmonary nodule as well as a hypervascular mass in the liver.  These findings were discussed today in detail with the patient and his daughter including the differential diagnosis.  Metastatic disease from a primary kidney cancer is a certainly a possibility despite his remote history of kidney cancer at early stage.  Other metastatic malignancies could be also possibility such as colon cancer, upper GI cancer in addition to others.  To complete his work-up, he will need a CT scan of the chest as well as a tissue biopsy for confirmation.  After obtaining these studies he will return to discuss management options.  These findings likely represent advanced malignancy that is incurable.  I do not think primary surgical options exist at this time given the multiple locations of these tumors that are detected.  I discussed with him the role of systemic therapy as a potential options especially  if we are dealing with kidney cancer.  These options would include oral targeted therapy possibly intravenous immunotherapy.  We will finalize these discussions after confirmation and obtaining tissue biopsy.  2.  Kidney cancer: He had clear cell carcinoma of the right kidney and status post nephrectomy in 2016 for T1a tumor.  It is unclear whether these findings on his recent CT scan represent recurrence.  3.  Abdominal pain: Likely related to his metastatic disease.  He has a peritoneal metastasis as well as lymphadenopathy that could be contributing to his pain.  Treating the underlying etiology of this tumor will likely improve his pain moving forward.  4.  Follow-up: We will be in the next few weeks after obtaining tissue biopsy and complete the staging.  60  minutes was spent with the patient face-to-face today.  More than 50% of time was dedicated to patient counseling, education and coordination of his care.     Thank you for the referral.  I had the pleasure of meeting [name].  A copy of this consult has been forwarded to the requesting physician.

## 2017-09-02 NOTE — Telephone Encounter (Signed)
Gave patient avs and calendar od upcoming July appts.

## 2017-09-06 ENCOUNTER — Other Ambulatory Visit: Payer: Self-pay | Admitting: Radiology

## 2017-09-07 ENCOUNTER — Encounter (HOSPITAL_COMMUNITY): Payer: Self-pay

## 2017-09-07 ENCOUNTER — Ambulatory Visit (HOSPITAL_COMMUNITY)
Admission: RE | Admit: 2017-09-07 | Discharge: 2017-09-07 | Disposition: A | Discharge status: Home / Self Care | Payer: Medicare HMO | Source: Ambulatory Visit | Attending: Oncology | Admitting: Oncology

## 2017-09-07 DIAGNOSIS — Z79899 Other long term (current) drug therapy: Secondary | ICD-10-CM | POA: Insufficient documentation

## 2017-09-07 DIAGNOSIS — Z7982 Long term (current) use of aspirin: Secondary | ICD-10-CM | POA: Insufficient documentation

## 2017-09-07 DIAGNOSIS — I129 Hypertensive chronic kidney disease with stage 1 through stage 4 chronic kidney disease, or unspecified chronic kidney disease: Secondary | ICD-10-CM | POA: Diagnosis not present

## 2017-09-07 DIAGNOSIS — N189 Chronic kidney disease, unspecified: Secondary | ICD-10-CM | POA: Diagnosis not present

## 2017-09-07 DIAGNOSIS — Z87891 Personal history of nicotine dependence: Secondary | ICD-10-CM | POA: Diagnosis not present

## 2017-09-07 DIAGNOSIS — I2510 Atherosclerotic heart disease of native coronary artery without angina pectoris: Secondary | ICD-10-CM | POA: Diagnosis not present

## 2017-09-07 DIAGNOSIS — Z85528 Personal history of other malignant neoplasm of kidney: Secondary | ICD-10-CM | POA: Diagnosis present

## 2017-09-07 DIAGNOSIS — Z9049 Acquired absence of other specified parts of digestive tract: Secondary | ICD-10-CM | POA: Insufficient documentation

## 2017-09-07 DIAGNOSIS — C786 Secondary malignant neoplasm of retroperitoneum and peritoneum: Secondary | ICD-10-CM | POA: Diagnosis not present

## 2017-09-07 DIAGNOSIS — Z905 Acquired absence of kidney: Secondary | ICD-10-CM | POA: Insufficient documentation

## 2017-09-07 DIAGNOSIS — Z85118 Personal history of other malignant neoplasm of bronchus and lung: Secondary | ICD-10-CM | POA: Diagnosis not present

## 2017-09-07 DIAGNOSIS — C649 Malignant neoplasm of unspecified kidney, except renal pelvis: Secondary | ICD-10-CM | POA: Diagnosis not present

## 2017-09-07 DIAGNOSIS — E7800 Pure hypercholesterolemia, unspecified: Secondary | ICD-10-CM | POA: Insufficient documentation

## 2017-09-07 DIAGNOSIS — K668 Other specified disorders of peritoneum: Secondary | ICD-10-CM | POA: Diagnosis not present

## 2017-09-07 DIAGNOSIS — C641 Malignant neoplasm of right kidney, except renal pelvis: Secondary | ICD-10-CM

## 2017-09-07 LAB — CBC
HCT: 37.9 % — ABNORMAL LOW (ref 39.0–52.0)
HEMOGLOBIN: 12.7 g/dL — AB (ref 13.0–17.0)
MCH: 30.5 pg (ref 26.0–34.0)
MCHC: 33.5 g/dL (ref 30.0–36.0)
MCV: 90.9 fL (ref 78.0–100.0)
Platelets: 229 10*3/uL (ref 150–400)
RBC: 4.17 MIL/uL — AB (ref 4.22–5.81)
RDW: 13.1 % (ref 11.5–15.5)
WBC: 6.8 10*3/uL (ref 4.0–10.5)

## 2017-09-07 LAB — PROTIME-INR
INR: 0.96
Prothrombin Time: 12.7 seconds (ref 11.4–15.2)

## 2017-09-07 LAB — APTT: aPTT: 35 seconds (ref 24–36)

## 2017-09-07 MED ORDER — NALOXONE HCL 0.4 MG/ML IJ SOLN
INTRAMUSCULAR | Status: AC
  Filled 2017-09-07: qty 1

## 2017-09-07 MED ORDER — SODIUM CHLORIDE 0.9 % IV SOLN
INTRAVENOUS | Status: DC
  Administered 2017-09-07: 07:00:00 via INTRAVENOUS

## 2017-09-07 MED ORDER — FENTANYL CITRATE (PF) 100 MCG/2ML IJ SOLN
INTRAMUSCULAR | Status: AC
  Filled 2017-09-07: qty 4

## 2017-09-07 MED ORDER — MIDAZOLAM HCL 2 MG/2ML IJ SOLN
INTRAMUSCULAR | Status: AC
  Filled 2017-09-07: qty 4

## 2017-09-07 MED ORDER — FLUMAZENIL 0.5 MG/5ML IV SOLN
INTRAVENOUS | Status: AC
  Filled 2017-09-07: qty 5

## 2017-09-07 MED ORDER — FENTANYL CITRATE (PF) 100 MCG/2ML IJ SOLN
INTRAMUSCULAR | Status: AC | PRN
  Administered 2017-09-07 (×2): 50 ug via INTRAVENOUS

## 2017-09-07 MED ORDER — MIDAZOLAM HCL 2 MG/2ML IJ SOLN
INTRAMUSCULAR | Status: AC | PRN
  Administered 2017-09-07 (×2): 1 mg via INTRAVENOUS

## 2017-09-07 NOTE — H&P (Signed)
Chief Complaint: Patient was seen in consultation today for biop at the request of Shadad,Firas N  Referring Physician(s): Wyatt Portela  Supervising Physician: Daryll Brod  Patient Status: [facility] - Out-pt  History of Present Illness: [name] is a 72 y.o. male with hx of clear cell carcinoma and prior partial right nephrectomy, now with multiple nodules suspicious for metastatic recurrence. He is referred for biopsy. PMHx, meds, labs, imaging, allergies reviewed. Feels well, no recent fevers, chills, illness. Has been NPO today as directed. Family at bedside.   Past Medical History:  Diagnosis Date  . Arthritis   . Chronic kidney disease    only has one kidney   . 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 02/01/2015  . Coronary artery disease    followed by Dr.Tilley  . GERD (gastroesophageal reflux disease)   . Heart murmur   . High cholesterol   . Hx of cancer of lung 1980's  . Hypertension   . Incisional hernia 08/01/2015  . Lung cancer ([place])   . Recurrent umbilical hernia 3/90/3009  . Right renal mass     Past Surgical History:  Procedure Laterality Date  . APPENDECTOMY  age 15  . CHOLECYSTECTOMY  1980's  . LAPAROSCOPIC LYSIS OF ADHESIONS N/A 08/01/2015   Procedure: LAPAROSCOPIC LYSIS OF ADHESIONS;  Surgeon: Michael Boston, MD;  Location: [facility];  Service: General;  Laterality: N/A;  . LUNG LOBECTOMY  1992   lung cancer- patient has staples in lung not to have MRI per patient   . PILONIDAL CYST EXCISION  2011   Dr Harlow Asa  . ROBOTIC ASSITED PARTIAL NEPHRECTOMY Right 02/01/2015   Procedure: RIGHT ROBOTIC ASSISTED LAPAROSOCOPY NEPHRECTOMY;  Surgeon: Ardis Hughs, MD;  Location: [facility];  Service: Urology;  Laterality: Right;  . VENTRAL HERNIA REPAIR N/A 08/01/2015   Procedure: LAPAROSCOPIC VENTRAL WALL HERNIA WITH MESH;  Surgeon: Michael Boston, MD;  Location: [facility];  Service: General;  Laterality: N/A;    Allergies: Patient has  no known allergies.  Medications: Prior to Admission medications   Medication Sig Start Date End Date Taking? Authorizing Provider  acetaminophen (TYLENOL) 500 MG tablet Take 1,000 mg by mouth daily.    Yes [provider]  amLODipine (NORVASC) 5 MG tablet Take 5 mg by mouth daily.   Yes [provider]  aspirin 81 MG chewable tablet Chew 81 mg by mouth daily.   Yes [provider]  lisinopril-hydrochlorothiazide (PRINZIDE,ZESTORETIC) 20-25 MG tablet Take 1 tablet by mouth daily.   Yes [provider]  ranitidine (ZANTAC) 150 MG capsule Take 150 mg by mouth daily.   Yes [provider]  traZODone (DESYREL) 150 MG tablet Take 150 mg by mouth at bedtime.   Yes [provider]     History reviewed. No pertinent family history.  Social History   Socioeconomic History  . Marital status: Married    Spouse name: Not on file  . Number of children: Not on file  . Years of education: Not on file  . Highest education level: Not on file  Occupational History  . Not on file  Social Needs  . Financial resource strain: Not on file  . Food insecurity:    Worry: Not on file    Inability: Not on file  . Transportation needs:    Medical: Not on file    Non-medical: Not on file  Tobacco Use  . Smoking status: Former Smoker    Last attempt to  quit: 01/29/1988    Years since quitting: 29.6  . Smokeless tobacco: Never Used  Substance and Sexual Activity  . Alcohol use: Yes    Comment: rare  . Drug use: No  . Sexual activity: Not on file  Lifestyle  . Physical activity:    Days per week: Not on file    Minutes per session: Not on file  . Stress: Not on file  Relationships  . Social connections:    Talks on phone: Not on file    Gets together: Not on file    Attends religious service: Not on file    Active member of club or organization: Not on file    Attends meetings of clubs or organizations: Not on file    Relationship status:  Not on file  Other Topics Concern  . Not on file  Social History Narrative  . Not on file     Review of Systems: A 12 point ROS discussed and pertinent positives are indicated in the HPI above.  All other systems are negative.  Review of Systems  Vital Signs: BP (!) 154/84   Pulse 63   Temp (!) 97.4 F (36.3 C) (Oral)   Resp 16   Ht 5\' 9"  (1.753 m)   Wt 211 lb (95.7 kg)   SpO2 94%   BMI 31.16 kg/m   Physical Exam  Constitutional: He is oriented to person, place, and time. He appears well-developed. No distress.  HENT:  Head: Normocephalic.  Mouth/Throat: Oropharynx is clear and moist.  Neck: Normal range of motion. No JVD present. No tracheal deviation present.  Cardiovascular: Normal rate and regular rhythm.  Pulmonary/Chest: Effort normal and breath sounds normal. No respiratory distress.  Abdominal: Soft. He exhibits no distension and no mass.  Neurological: He is alert and oriented to person, place, and time.  Skin: Skin is warm and dry.  Psychiatric: He has a normal mood and affect.    Imaging: No results found.  Labs:  CBC: Recent Labs    09/07/17 0702  WBC 6.8  HGB 12.7*  HCT 37.9*  PLT 229    COAGS: Recent Labs    09/07/17 0702  INR 0.96  APTT 35    BMP: No results for input(s): NA, K, CL, CO2, GLUCOSE, BUN, CALCIUM, CREATININE, GFRNONAA, GFRAA in the last 8760 hours.  Invalid input(s): CMP  LIVER FUNCTION TESTS: No results for input(s): BILITOT, AST, ALT, ALKPHOS, PROT, ALBUMIN in the last 8760 hours.  TUMOR MARKERS: No results for input(s): AFPTM, CEA, CA199, CHROMGRNA in the last 8760 hours.  Assessment and Plan: Hx of clear cell renal carcinoma Omental nodule For CT guided biopsy Labs ok. Risks and benefits discussed with the patient including, but not limited to bleeding, infection, damage to adjacent structures or low yield requiring additional tests.  All of the patient's questions were answered, patient is agreeable to  proceed. Consent signed and in chart.    Thank you for this interesting consult.  I greatly enjoyed meeting Harm Jou and look forward to participating in their care.  A copy of this report was sent to the requesting provider on this date.  Electronically Signed: Ascencion Dike, PA-C 09/07/2017, 8:22 AM   I spent a total of 20 minutes in face to face in clinical consultation, greater than 50% of which was counseling/coordinating care for omental biopsy

## 2017-09-07 NOTE — Discharge Instructions (Signed)
Needle Biopsy, Care After Refer to this sheet in the next few weeks. These instructions provide you with information about caring for yourself after your procedure. Your health care provider may also give you more specific instructions. Your treatment has been planned according to current medical practices, but problems sometimes occur. Call your health care provider if you have any problems or questions after your procedure. What can I expect after the procedure? After your procedure, it is common to have soreness, bruising, or mild pain at the biopsy site. This should go away in a few days. Follow these instructions at home:  Rest as directed by your health care provider.  Take medicines only as directed by your health care provider.  There are many different ways to close and cover the biopsy site, including stitches (sutures), skin glue, and adhesive strips. Follow your health care provider's instructions about: ? Biopsy site care. ? Bandage (dressing) changes and removal. ? Biopsy site closure removal.  Check your biopsy site every day for signs of infection. Watch for: ? Redness, swelling, or pain. ? Fluid, blood, or pus. Contact a health care provider if:  You have a fever.  You have redness, swelling, or pain at the biopsy site that lasts longer than a few days.  You have fluid, blood, or pus coming from the biopsy site.  You feel nauseous.  You vomit. Get help right away if:  You have shortness of breath.  You have trouble breathing.  You have chest pain.  You feel dizzy or you faint.  You have bleeding that does not stop with pressure or a bandage.  You cough up blood.  You have pain in your abdomen. This information is not intended to replace advice given to you by your health care provider. Make sure you discuss any questions you have with your health care provider. Document Released: 06/19/2014 Document Revised: 07/11/2015 Document Reviewed:  01/29/2014 Elsevier Interactive Patient Education  2018 [place].   Moderate Conscious Sedation, Adult, Care After These instructions provide you with information about caring for yourself after your procedure. Your health care provider may also give you more specific instructions. Your treatment has been planned according to current medical practices, but problems sometimes occur. Call your health care provider if you have any problems or questions after your procedure. What can I expect after the procedure? After your procedure, it is common:  To feel sleepy for several hours.  To feel clumsy and have poor balance for several hours.  To have poor judgment for several hours.  To vomit if you eat too soon.  Follow these instructions at home: For at least 24 hours after the procedure:   Do not: ? Participate in activities where you could fall or become injured. ? Drive. ? Use heavy machinery. ? Drink alcohol. ? Take sleeping pills or medicines that cause drowsiness. ? Make important decisions or sign legal documents. ? Take care of children on your own.  Rest. Eating and drinking  Follow the diet recommended by your health care provider.  If you vomit: ? Drink water, juice, or soup when you can drink without vomiting. ? Make sure you have little or no nausea before eating solid foods. General instructions  Have a responsible adult stay with you until you are awake and alert.  Take over-the-counter and prescription medicines only as told by your health care provider.  If you smoke, do not smoke without supervision.  Keep all follow-up visits as told by your  health care provider. This is important. Contact a health care provider if:  You keep feeling nauseous or you keep vomiting.  You feel light-headed.  You develop a rash.  You have a fever. Get help right away if:  You have trouble breathing. This information is not intended to replace advice given to  you by your health care provider. Make sure you discuss any questions you have with your health care provider. Document Released: 11/23/2012 Document Revised: 07/08/2015 Document Reviewed: 05/25/2015 Elsevier Interactive Patient Education  [employer].

## 2017-09-07 NOTE — Procedures (Signed)
Renal cell ca  Omental mass  S/p omental mass CT Bx  No comp Stable EBL min Path pending Full report in pacs

## 2017-09-09 ENCOUNTER — Ambulatory Visit (HOSPITAL_COMMUNITY)
Admission: RE | Admit: 2017-09-09 | Discharge: 2017-09-09 | Disposition: A | Discharge status: Home / Self Care | Payer: Medicare HMO | Source: Ambulatory Visit | Attending: Oncology | Admitting: Oncology

## 2017-09-09 DIAGNOSIS — R918 Other nonspecific abnormal finding of lung field: Secondary | ICD-10-CM | POA: Insufficient documentation

## 2017-09-09 DIAGNOSIS — C641 Malignant neoplasm of right kidney, except renal pelvis: Secondary | ICD-10-CM | POA: Insufficient documentation

## 2017-09-09 DIAGNOSIS — I700 Atherosclerosis of aorta: Secondary | ICD-10-CM | POA: Insufficient documentation

## 2017-09-09 DIAGNOSIS — I2510 Atherosclerotic heart disease of native coronary artery without angina pectoris: Secondary | ICD-10-CM | POA: Insufficient documentation

## 2017-09-09 DIAGNOSIS — J439 Emphysema, unspecified: Secondary | ICD-10-CM | POA: Diagnosis not present

## 2017-09-14 ENCOUNTER — Inpatient Hospital Stay (HOSPITAL_BASED_OUTPATIENT_CLINIC_OR_DEPARTMENT_OTHER): Payer: Medicare HMO | Admitting: Oncology

## 2017-09-14 DIAGNOSIS — C787 Secondary malignant neoplasm of liver and intrahepatic bile duct: Secondary | ICD-10-CM

## 2017-09-14 DIAGNOSIS — Z905 Acquired absence of kidney: Secondary | ICD-10-CM

## 2017-09-14 DIAGNOSIS — R911 Solitary pulmonary nodule: Secondary | ICD-10-CM | POA: Diagnosis not present

## 2017-09-14 DIAGNOSIS — R109 Unspecified abdominal pain: Secondary | ICD-10-CM | POA: Diagnosis not present

## 2017-09-14 DIAGNOSIS — I10 Essential (primary) hypertension: Secondary | ICD-10-CM | POA: Diagnosis not present

## 2017-09-14 DIAGNOSIS — R591 Generalized enlarged lymph nodes: Secondary | ICD-10-CM | POA: Diagnosis not present

## 2017-09-14 DIAGNOSIS — C786 Secondary malignant neoplasm of retroperitoneum and peritoneum: Secondary | ICD-10-CM

## 2017-09-14 DIAGNOSIS — Z801 Family history of malignant neoplasm of trachea, bronchus and lung: Secondary | ICD-10-CM | POA: Diagnosis not present

## 2017-09-14 DIAGNOSIS — C649 Malignant neoplasm of unspecified kidney, except renal pelvis: Secondary | ICD-10-CM

## 2017-09-14 DIAGNOSIS — Z87891 Personal history of nicotine dependence: Secondary | ICD-10-CM | POA: Diagnosis not present

## 2017-09-14 DIAGNOSIS — Z85528 Personal history of other malignant neoplasm of kidney: Secondary | ICD-10-CM | POA: Diagnosis not present

## 2017-09-14 DIAGNOSIS — Z7189 Other specified counseling: Secondary | ICD-10-CM

## 2017-09-14 DIAGNOSIS — M7989 Other specified soft tissue disorders: Secondary | ICD-10-CM | POA: Diagnosis not present

## 2017-09-14 DIAGNOSIS — K769 Liver disease, unspecified: Secondary | ICD-10-CM | POA: Diagnosis not present

## 2017-09-14 NOTE — Progress Notes (Signed)
START ON PATHWAY REGIMEN - Renal Cell     A cycle is every 21 days:     Nivolumab      Ipilimumab    A cycle is every 28 days:     Nivolumab   **Always confirm dose/schedule in your pharmacy ordering system**  Patient Characteristics: Metastatic, Clear Cell, First Line, Intermediate or Poor Risk AJCC M Category: M1 AJCC 8 Stage Grouping: IV Current evidence of distant metastases<= Yes AJCC T Category: TX AJCC N Category: NX Does patient have oligometastatic disease<= No Histology: Clear Cell Line of therapy: First Line Risk Status: Intermediate Risk Intent of Therapy: Non-Curative / Palliative Intent, Discussed with Patient

## 2017-09-14 NOTE — Progress Notes (Signed)
Hematology and Oncology Follow Up Visit  [name] [id number] 1946-01-27 72 y.o. 09/14/2017 4:11 PM [name], MDElkins, [name], *   Principle Diagnosis: 72 year old man with stage IV renal cell carcinoma documented in July 2019 with disease to the liver as well as peritoneum.  He was initially diagnosed with a clear cell carcinoma of the kidney in December 2018.   Prior Therapy: He underwent a radical nephrectomy completed by Dr. Louis Meckel on February 01, 2015.  The final pathology revealed a 3.3 cm clear cell renal cell carcinoma with Fuhrman grade 3.  Margins were negative indicating stage T1 a disease    Current therapy: Under consideration to start systemic therapy.  Interim History: [name] presents today for a follow-up visit.  Since last visit, he underwent a biopsy of his a peritoneal lesion which confirmed the presence of renal cell carcinoma.  He tolerated the procedure well without any complications.  He does report some abdominal discomfort and fullness but otherwise no other complaints.  He denies any nausea or diarrhea.  He denies any respiratory complaints.  He remains active and attends to activities of daily living.  He does not report any headaches, blurry vision, syncope or seizures. Does not report any fevers, chills or sweats.  Does not report any cough, wheezing or hemoptysis.  Does not report any chest pain, palpitation, orthopnea or leg edema.  Does not report any nausea, vomiting or abdominal pain.  Does not report any constipation or diarrhea.  Does not report any skeletal complaints.    Does not report frequency, urgency or hematuria.  Does not report any skin rashes or lesions. Does not report any heat or cold intolerance.  Does not report any lymphadenopathy or petechiae.  Does not report any anxiety or depression.  Remaining review of systems is negative.    Medications: I have reviewed the patient's current medications.  Current Outpatient  Medications  Medication Sig Dispense Refill  . acetaminophen (TYLENOL) 500 MG tablet Take 1,000 mg by mouth daily.     Marland Kitchen amLODipine (NORVASC) 5 MG tablet Take 5 mg by mouth daily.    Marland Kitchen aspirin 81 MG chewable tablet Chew 81 mg by mouth daily.    Marland Kitchen lisinopril-hydrochlorothiazide (PRINZIDE,ZESTORETIC) 20-25 MG tablet Take 1 tablet by mouth daily.    . ranitidine (ZANTAC) 150 MG capsule Take 150 mg by mouth daily.    . traZODone (DESYREL) 150 MG tablet Take 150 mg by mouth at bedtime.     No current facility-administered medications for this visit.      Allergies: No Known Allergies  Past Medical History, Surgical history, Social history, and Family History were reviewed and updated.  Review of Systems:  Remaining ROS negative.  Physical Exam: Blood pressure 140/70, pulse 67, temperature 97.8 F (36.6 C), temperature source Oral, resp. rate 18, height 5\' 9"  (1.753 m), weight 210 lb 14.4 oz (95.7 kg), SpO2 97 %. ECOG: 0 General appearance: alert and cooperative appeared without distress. Head: Normocephalic, without obvious abnormality Oropharynx: No oral thrush or ulcers. Eyes: No scleral icterus.  Pupils are equal and round reactive to light. Lymph nodes: Cervical, supraclavicular, and axillary nodes normal. Heart:regular rate and rhythm, S1, S2 normal, no murmur, click, rub or gallop Lung:chest clear, no wheezing, rales, normal symmetric air entry Abdomin: soft, non-tender, without masses or organomegaly. Neurological: No motor, sensory deficits.  Intact deep tendon reflexes. Skin: No rashes or lesions.  No ecchymosis or petechiae. Musculoskeletal: No joint deformity or effusion. Psychiatric: Mood  and affect are appropriate.    Lab Results: Lab Results  Component Value Date   WBC 6.8 09/07/2017   HGB 12.7 (L) 09/07/2017   HCT 37.9 (L) 09/07/2017   MCV 90.9 09/07/2017   PLT 229 09/07/2017     Chemistry      Component Value Date/Time   NA 139 07/29/2015 0915   K 5.1  07/29/2015 0915   CL 103 07/29/2015 0915   CO2 30 07/29/2015 0915   BUN 28 (H) 07/29/2015 0915   CREATININE 1.64 (H) 07/29/2015 0915      Component Value Date/Time   CALCIUM 9.4 07/29/2015 0915   ALKPHOS 42 01/29/2015 0930   AST 21 01/29/2015 0930   ALT 23 01/29/2015 0930   BILITOT 0.7 01/29/2015 0930       Radiological Studies:  EXAM: CT CHEST WITHOUT CONTRAST  TECHNIQUE: Multidetector CT imaging of the chest was performed following the standard protocol without IV contrast.  COMPARISON:  Abdominal CT 08/03/2017. No dedicated comparison chest CT. Chest radiograph 08/19/2015 reviewed.  FINDINGS: Cardiovascular: Aortic and branch vessel atherosclerosis. Upper normal ascending aortic size at 3.9 cm. Mild cardiomegaly with multivessel coronary artery atherosclerosis.  Mediastinum/Nodes: No supraclavicular adenopathy. No mediastinal or definite hilar adenopathy, given limitations of unenhanced CT.  Lungs/Pleura: Right hemidiaphragm elevation. No pleural fluid. Right upper and probable right middle lobectomy. Mild centrilobular emphysema. Well-circumscribed left lower lobe pulmonary nodule of 1.8 x 1.6 cm on 82/5. Compare 1.7 x 1.6 cm on the prior.  A lingular nodule measures 6 mm on 82/5 versus 5 mm on the prior. No other pulmonary nodules identified.  Upper Abdomen: Unchanged appearance of the liver since the prior contrast-enhanced CT of 1 month ago. Normal imaged portions of the spleen, stomach, pancreas, left adrenal gland, and left kidney. Retrocaval adenopathy on 156/2 is suboptimally evaluated on this noncontrast study. A pre caval node measures 11 mm today and is similar to 12 mm on the prior.  Left abdominal omental mass of 2.6 x 2.2 cm is incompletely imaged. 4.2 x 2.7 cm on the prior.  Musculoskeletal: Upper right rib postsurgical or posttraumatic defects.  IMPRESSION: 1. Left lower lobe and lingular nodules, most consistent with metastatic  disease. Similar to slightly increased compared to 08/03/2017. 2. No thoracic adenopathy. 3. Upper abdominal recurrent/metastatic disease, suboptimally evaluated. 4. Aortic atherosclerosis (ICD10-I70.0), coronary artery atherosclerosis and emphysema (ICD10-J43.9).  Impression and Plan:  72 year old man with the following:  1.  Renal cell carcinoma diagnosed in December 2016 and underwent nephrectomy at the time.  He developed recurrent disease in 2019 with a biopsy-proven on 09/07/2017 showing clear cell carcinoma.  The natural course of this disease was reviewed today with the patient.  Treatment options were also discussed.  He has left lower lung metastasis based on his CT scan that was obtained on 09/09/2017.  These imaging studies and results were reviewed today with the patient personally.  Treatment options were reviewed which includes oral targeted therapy, combined immunotherapy agents or combination of immunotherapy and oral targeted therapy.  The rationale for all these approaches were reviewed today in detail.  After discussion today, given the aggressive nature of his disease with hepatic metastasis, I am in favor of using combined immunotherapy agents.  The rationale for using ipilimumab and nivolumab was discussed today with the patient in detail.  Complication associated with this therapy was discussed.  These complications will include skin rash, thyroid disease, pneumonitis, colitis, arthritis and many other autoimmune potential complications.  This offers him  the best chance of long-term disease survival and potentially stability of disease as well.  His performance status is excellent and he can certainly handle this treatment.  After discussion today he is agreeable to proceed after education class.  He will receive ipilimumab at 1 mg/kg every 3 weeks with nivolumab 3 mg/kg every 3 weeks for 4 cycles.  After that we will repeat imaging studies and determine response at that  time.   2.  Immune mediated complications: These were discussed today in detail and will be monitored periodically including periodic testing of his TSH.  3.  IV access: Peripheral veins will be used at this time.  Port-A-Cath can be considered in the future.  4.  Prognosis and goals of care: Treatment is palliative but his performance status is excellent and long-term responses can occur.  5.  Follow-up: We will be in the immediate future to start therapy.  25  minutes was spent with the patient face-to-face today.  More than 50% of time was dedicated to patient counseling, education and coordination of his future plan of care.   Zola Button, MD 7/30/20194:11 PM

## 2017-09-15 ENCOUNTER — Telehealth: Payer: Self-pay

## 2017-09-15 ENCOUNTER — Telehealth: Payer: Self-pay | Admitting: Oncology

## 2017-09-15 ENCOUNTER — Other Ambulatory Visit: Payer: Self-pay | Admitting: *Deleted

## 2017-09-15 NOTE — Telephone Encounter (Signed)
Spoke with daughter concerning patient upcoming appointment. Will mail a letter, with a calender enclosed. Per 7/30 los. RN Nyoka Cowden) will add Chemo consult order for patient. (scheduled for 8/5)

## 2017-09-15 NOTE — Telephone Encounter (Signed)
R/s patients appt to later time same day.

## 2017-09-17 NOTE — Progress Notes (Signed)
Received authorization from [place] Authorization # [id number] Authorization valid from 09/15/2017-03/18/2018 Scanned into patient chart

## 2017-09-20 ENCOUNTER — Other Ambulatory Visit: Payer: Medicare HMO

## 2017-09-20 ENCOUNTER — Inpatient Hospital Stay: Payer: Medicare HMO

## 2017-09-20 ENCOUNTER — Inpatient Hospital Stay: Payer: Medicare HMO | Attending: Oncology

## 2017-09-20 DIAGNOSIS — C786 Secondary malignant neoplasm of retroperitoneum and peritoneum: Secondary | ICD-10-CM | POA: Insufficient documentation

## 2017-09-20 DIAGNOSIS — Z5112 Encounter for antineoplastic immunotherapy: Secondary | ICD-10-CM | POA: Insufficient documentation

## 2017-09-20 DIAGNOSIS — C787 Secondary malignant neoplasm of liver and intrahepatic bile duct: Secondary | ICD-10-CM | POA: Diagnosis not present

## 2017-09-20 DIAGNOSIS — Z79899 Other long term (current) drug therapy: Secondary | ICD-10-CM | POA: Insufficient documentation

## 2017-09-20 DIAGNOSIS — C649 Malignant neoplasm of unspecified kidney, except renal pelvis: Secondary | ICD-10-CM | POA: Diagnosis present

## 2017-09-20 LAB — CMP (CANCER CENTER ONLY)
ALK PHOS: 58 U/L (ref 38–126)
ALT: 13 U/L (ref 0–44)
AST: 11 U/L — AB (ref 15–41)
Albumin: 4 g/dL (ref 3.5–5.0)
Anion gap: 10 (ref 5–15)
BUN: 21 mg/dL (ref 8–23)
CALCIUM: 9.1 mg/dL (ref 8.9–10.3)
CO2: 25 mmol/L (ref 22–32)
CREATININE: 1.97 mg/dL — AB (ref 0.61–1.24)
Chloride: 104 mmol/L (ref 98–111)
GFR, EST AFRICAN AMERICAN: 37 mL/min — AB (ref >60)
GFR, Estimated: 32 mL/min — ABNORMAL LOW (ref >60)
Glucose, Bld: 173 mg/dL — ABNORMAL HIGH (ref 70–99)
Potassium: 4.2 mmol/L (ref 3.5–5.1)
SODIUM: 139 mmol/L (ref 135–145)
Total Bilirubin: 0.4 mg/dL (ref 0.3–1.2)
Total Protein: 7.4 g/dL (ref 6.5–8.1)

## 2017-09-20 LAB — CBC WITH DIFFERENTIAL (CANCER CENTER ONLY)
BASOS PCT: 0 %
Basophils Absolute: 0 10*3/uL (ref 0.0–0.1)
EOS ABS: 0.2 10*3/uL (ref 0.0–0.5)
EOS PCT: 3 %
HCT: 36 % — ABNORMAL LOW (ref 38.4–49.9)
HEMOGLOBIN: 12.3 g/dL — AB (ref 13.0–17.1)
LYMPHS ABS: 1.5 10*3/uL (ref 0.9–3.3)
Lymphocytes Relative: 24 %
MCH: 30.5 pg (ref 27.2–33.4)
MCHC: 34.1 g/dL (ref 32.0–36.0)
MCV: 89.2 fL (ref 79.3–98.0)
MONO ABS: 0.4 10*3/uL (ref 0.1–0.9)
MONOS PCT: 7 %
NEUTROS PCT: 66 %
Neutro Abs: 4.2 10*3/uL (ref 1.5–6.5)
PLATELETS: 230 10*3/uL (ref 140–400)
RBC: 4.03 MIL/uL — ABNORMAL LOW (ref 4.20–5.82)
RDW: 13.5 % (ref 11.0–14.6)
WBC Count: 6.4 10*3/uL (ref 4.0–10.3)

## 2017-09-20 LAB — TSH: TSH: 1.756 u[IU]/mL (ref 0.320–4.118)

## 2017-09-21 ENCOUNTER — Other Ambulatory Visit: Payer: Self-pay | Admitting: Oncology

## 2017-09-21 ENCOUNTER — Inpatient Hospital Stay: Payer: Medicare HMO

## 2017-09-21 VITALS — BP 130/57 | HR 64 | Temp 98.0°F | Resp 17 | Ht 69.0 in | Wt 210.5 lb

## 2017-09-21 DIAGNOSIS — Z5112 Encounter for antineoplastic immunotherapy: Secondary | ICD-10-CM | POA: Diagnosis not present

## 2017-09-21 DIAGNOSIS — C649 Malignant neoplasm of unspecified kidney, except renal pelvis: Secondary | ICD-10-CM

## 2017-09-21 MED ORDER — SODIUM CHLORIDE 0.9 % IV SOLN
1.0600 mg/kg | Freq: Once | INTRAVENOUS | Status: AC
  Administered 2017-09-21: 100 mg via INTRAVENOUS
  Filled 2017-09-21: qty 20

## 2017-09-21 MED ORDER — DIPHENHYDRAMINE HCL 50 MG/ML IJ SOLN
25.0000 mg | Freq: Once | INTRAMUSCULAR | Status: AC
  Administered 2017-09-21: 25 mg via INTRAVENOUS

## 2017-09-21 MED ORDER — PROCHLORPERAZINE MALEATE 10 MG PO TABS: 10.0000 mg | ORAL_TABLET | Freq: Four times a day (QID) | ORAL | 0 refills | Status: DC | PRN

## 2017-09-21 MED ORDER — FAMOTIDINE IN NACL 20-0.9 MG/50ML-% IV SOLN
INTRAVENOUS | Status: AC
  Filled 2017-09-21: qty 50

## 2017-09-21 MED ORDER — FAMOTIDINE IN NACL 20-0.9 MG/50ML-% IV SOLN
20.0000 mg | Freq: Once | INTRAVENOUS | Status: AC
  Administered 2017-09-21: 20 mg via INTRAVENOUS

## 2017-09-21 MED ORDER — SODIUM CHLORIDE 0.9 % IV SOLN
Freq: Once | INTRAVENOUS | Status: AC
  Administered 2017-09-21: 08:00:00 via INTRAVENOUS
  Filled 2017-09-21: qty 250

## 2017-09-21 MED ORDER — NIVOLUMAB CHEMO INJECTION 100 MG/10ML
2.9300 mg/kg | Freq: Once | INTRAVENOUS | Status: AC
  Administered 2017-09-21: 280 mg via INTRAVENOUS
  Filled 2017-09-21: qty 4

## 2017-09-21 MED ORDER — DIPHENHYDRAMINE HCL 50 MG/ML IJ SOLN
INTRAMUSCULAR | Status: AC
  Filled 2017-09-21: qty 1

## 2017-09-21 NOTE — Patient Instructions (Signed)
[facility] Discharge Instructions for Patients Receiving Chemotherapy  Today you received the following chemotherapy agents: Nivolumab (Opdivo) and Ipilimumab [name])  To help prevent nausea and vomiting after your treatment, we encourage you to take your nausea medication as prescribed.    If you develop nausea and vomiting that is not controlled by your nausea medication, call the clinic.   BELOW ARE SYMPTOMS THAT SHOULD BE REPORTED IMMEDIATELY:  *FEVER GREATER THAN 100.5 F  *CHILLS WITH OR WITHOUT FEVER  NAUSEA AND VOMITING THAT IS NOT CONTROLLED WITH YOUR NAUSEA MEDICATION  *UNUSUAL SHORTNESS OF BREATH  *UNUSUAL BRUISING OR BLEEDING  TENDERNESS IN MOUTH AND THROAT WITH OR WITHOUT PRESENCE OF ULCERS  *URINARY PROBLEMS  *BOWEL PROBLEMS  UNUSUAL RASH Items with * indicate a potential emergency and should be followed up as soon as possible.  Feel free to call the clinic should you have any questions or concerns. The clinic phone number is [phone].  Please show the [facility] at check-in to the Emergency Department and triage nurse.  Nivolumab injection What is this medicine? NIVOLUMAB (nye VOL ue mab) is a monoclonal antibody. It is used to treat melanoma, lung cancer, kidney cancer, head and neck cancer, Hodgkin lymphoma, urothelial cancer, colon cancer, and liver cancer. This medicine may be used for other purposes; ask your health care provider or pharmacist if you have questions. COMMON BRAND NAME(S): Opdivo What should I tell my health care provider before I take this medicine? They need to know if you have any of these conditions: -diabetes -immune system problems -kidney disease -liver disease -lung disease -organ transplant -stomach or intestine problems -thyroid disease -an unusual or allergic reaction to nivolumab, other medicines, foods, dyes, or preservatives -pregnant or trying to get pregnant -breast-feeding How should I  use this medicine? This medicine is for infusion into a vein. It is given by a health care professional in a hospital or clinic setting. A special MedGuide will be given to you before each treatment. Be sure to read this information carefully each time. Talk to your pediatrician regarding the use of this medicine in children. While this drug may be prescribed for children as young as 12 years for selected conditions, precautions do apply. Overdosage: If you think you have taken too much of this medicine contact a poison control center or emergency room at once. NOTE: This medicine is only for you. Do not share this medicine with others. What if I miss a dose? It is important not to miss your dose. Call your doctor or health care professional if you are unable to keep an appointment. What may interact with this medicine? Interactions have not been studied. Give your health care provider a list of all the medicines, herbs, non-prescription drugs, or dietary supplements you use. Also tell them if you smoke, drink alcohol, or use illegal drugs. Some items may interact with your medicine. This list may not describe all possible interactions. Give your health care provider a list of all the medicines, herbs, non-prescription drugs, or dietary supplements you use. Also tell them if you smoke, drink alcohol, or use illegal drugs. Some items may interact with your medicine. What should I watch for while using this medicine? This drug may make you feel generally unwell. Continue your course of treatment even though you feel ill unless your doctor tells you to stop. You may need blood work done while you are taking this medicine. Do not become pregnant while taking this medicine or  for 5 months after stopping it. Women should inform their doctor if they wish to become pregnant or think they might be pregnant. There is a potential for serious side effects to an unborn child. Talk to your health care professional  or pharmacist for more information. Do not breast-feed an infant while taking this medicine. What side effects may I notice from receiving this medicine? Side effects that you should report to your doctor or health care professional as soon as possible: -allergic reactions like skin rash, itching or hives, swelling of the face, lips, or tongue -black, tarry stools -blood in the urine -bloody or watery diarrhea -changes in vision -change in sex drive -changes in emotions or moods -chest pain -confusion -cough -decreased appetite -diarrhea -facial flushing -feeling faint or lightheaded -fever, chills -hair loss -hallucination, loss of contact with reality -headache -irritable -joint pain -loss of memory -muscle pain -muscle weakness -seizures -shortness of breath -signs and symptoms of high blood sugar such as dizziness; dry mouth; dry skin; fruity breath; nausea; stomach pain; increased hunger or thirst; increased urination -signs and symptoms of kidney injury like trouble passing urine or change in the amount of urine -signs and symptoms of liver injury like dark yellow or brown urine; general ill feeling or flu-like symptoms; light-colored stools; loss of appetite; nausea; right upper belly pain; unusually weak or tired; yellowing of the eyes or skin -stiff neck -swelling of the ankles, feet, hands -weight gain Side effects that usually do not require medical attention (report to your doctor or health care professional if they continue or are bothersome): -bone pain -constipation -tiredness -vomiting This list may not describe all possible side effects. Call your doctor for medical advice about side effects. You may report side effects to FDA at 1-800-FDA-1088. Where should I keep my medicine? This drug is given in a hospital or clinic and will not be stored at home. NOTE: This sheet is a summary. It may not cover all possible information. If you have questions about this  medicine, talk to your doctor, pharmacist, or health care provider.  2018 Elsevier/Gold Standard (2015-11-11 17:49:34)  Ipilimumab injection What is this medicine? IPILIMUMAB (IP i LIM ue mab) is a monoclonal antibody. It is used to treat melanoma, a type of skin cancer. This medicine may be used for other purposes; ask your health care provider or pharmacist if you have questions. COMMON BRAND NAME(S): YERVOY What should I tell my health care provider before I take this medicine? They need to know if you have any of these conditions: -Addison's disease -blood in your stools (black or tarry stools) or if you have blood in your vomit -eye disease, vision problems -history of pancreatitis -history of stomach bleeding -immune system problems -inflammatory bowel disease -kidney disease -liver disease -lupus -myasthenia gravis -organ transplant -rheumatoid arthritis -sarcoidosis -stomach or intestine problems -thyroid disease -tingling of the fingers or toes, or other nerve disorder -an unusual or allergic reaction to ipilimumab, other medicines, foods, dyes, or preservatives -pregnant or trying to get pregnant -breast-feeding How should I use this medicine? This medicine is for infusion into a vein. It is given by a health care professional in a hospital or clinic setting. A special MedGuide will be given to you before each treatment. Be sure to read this information carefully each time. Talk to your pediatrician regarding the use of this medicine in children. While this drug may be prescribed for children as young as 12 years for selected conditions, precautions do apply. Overdosage:  If you think you have taken too much of this medicine contact a poison control center or emergency room at once. NOTE: This medicine is only for you. Do not share this medicine with others. What if I miss a dose? It is important not to miss your dose. Call your doctor or health care professional if you  are unable to keep an appointment. What may interact with this medicine? Interactions are not expected. This list may not describe all possible interactions. Give your health care provider a list of all the medicines, herbs, non-prescription drugs, or dietary supplements you use. Also tell them if you smoke, drink alcohol, or use illegal drugs. Some items may interact with your medicine. What should I watch for while using this medicine? Tell your doctor or healthcare professional if your symptoms do not start to get better or if they get worse. Do not become pregnant while taking this medicine or for 3 months after stopping it. Women should inform their doctor if they wish to become pregnant or think they might be pregnant. There is a potential for serious side effects to an unborn child. Talk to your health care professional or pharmacist for more information. Do not breast-feed an infant while taking this medicine or for 3 months after the last dose. Your condition will be monitored carefully while you are receiving this medicine. You may need blood work done while you are taking this medicine. What side effects may I notice from receiving this medicine? Side effects that you should report to your doctor or health care professional as soon as possible: -allergic reactions like skin rash, itching or hives, swelling of the face, lips, or tongue -black, tarry stools -bloody or watery diarrhea -changes in vision -dizziness -eye pain -fast, irregular heartbeat -feeling anxious -feeling faint or lightheaded, falls -nausea, vomiting -pain, tingling, numbness in the hands or feet -redness, blistering, peeling or loosening of the skin, including inside the mouth -signs and symptoms of liver injury like dark yellow or brown urine; general ill feeling or flu-like symptoms; light-colored stools; loss of appetite; nausea; right upper belly pain; unusually weak or tired; yellowing of the eyes or  skin -unusual bleeding or bruising Side effects that usually do not require medical attention (report to your doctor or health care professional if they continue or are bothersome): -headache -loss of appetite -trouble sleeping This list may not describe all possible side effects. Call your doctor for medical advice about side effects. You may report side effects to FDA at 1-800-FDA-1088. Where should I keep my medicine? This drug is given in a hospital or clinic and will not be stored at home. NOTE: This sheet is a summary. It may not cover all possible information. If you have questions about this medicine, talk to your doctor, pharmacist, or health care provider.  2018 Elsevier/Gold Standard (2015-09-10 11:41:46)

## 2017-09-21 NOTE — Progress Notes (Signed)
Per Dr. Alen Blew: OK to treat with creatinine of 1.97

## 2017-09-23 ENCOUNTER — Inpatient Hospital Stay: Payer: Medicare HMO

## 2017-10-13 ENCOUNTER — Telehealth: Payer: Self-pay | Admitting: Oncology

## 2017-10-13 ENCOUNTER — Inpatient Hospital Stay: Payer: Medicare HMO

## 2017-10-13 ENCOUNTER — Inpatient Hospital Stay (HOSPITAL_BASED_OUTPATIENT_CLINIC_OR_DEPARTMENT_OTHER): Payer: Medicare HMO | Admitting: Oncology

## 2017-10-13 VITALS — BP 136/76 | HR 73 | Temp 97.8°F | Resp 17 | Ht 69.0 in | Wt 209.1 lb

## 2017-10-13 DIAGNOSIS — Z905 Acquired absence of kidney: Secondary | ICD-10-CM

## 2017-10-13 DIAGNOSIS — L539 Erythematous condition, unspecified: Secondary | ICD-10-CM

## 2017-10-13 DIAGNOSIS — Z5112 Encounter for antineoplastic immunotherapy: Secondary | ICD-10-CM | POA: Diagnosis not present

## 2017-10-13 DIAGNOSIS — C649 Malignant neoplasm of unspecified kidney, except renal pelvis: Secondary | ICD-10-CM

## 2017-10-13 DIAGNOSIS — C786 Secondary malignant neoplasm of retroperitoneum and peritoneum: Secondary | ICD-10-CM | POA: Diagnosis not present

## 2017-10-13 DIAGNOSIS — C787 Secondary malignant neoplasm of liver and intrahepatic bile duct: Secondary | ICD-10-CM

## 2017-10-13 LAB — CMP (CANCER CENTER ONLY)
ALBUMIN: 3.9 g/dL (ref 3.5–5.0)
ALK PHOS: 62 U/L (ref 38–126)
ALT: 9 U/L (ref 0–44)
AST: 11 U/L — AB (ref 15–41)
Anion gap: 8 (ref 5–15)
BILIRUBIN TOTAL: 0.4 mg/dL (ref 0.3–1.2)
BUN: 25 mg/dL — AB (ref 8–23)
CALCIUM: 9.3 mg/dL (ref 8.9–10.3)
CO2: 28 mmol/L (ref 22–32)
Chloride: 104 mmol/L (ref 98–111)
Creatinine: 1.71 mg/dL — ABNORMAL HIGH (ref 0.61–1.24)
GFR, Est AFR Am: 44 mL/min — ABNORMAL LOW (ref >60)
GFR, Estimated: 38 mL/min — ABNORMAL LOW (ref >60)
GLUCOSE: 155 mg/dL — AB (ref 70–99)
Potassium: 4.2 mmol/L (ref 3.5–5.1)
SODIUM: 140 mmol/L (ref 135–145)
TOTAL PROTEIN: 7.4 g/dL (ref 6.5–8.1)

## 2017-10-13 LAB — CBC WITH DIFFERENTIAL (CANCER CENTER ONLY)
BASOS ABS: 0 10*3/uL (ref 0.0–0.1)
BASOS PCT: 0 %
Eosinophils Absolute: 0.3 10*3/uL (ref 0.0–0.5)
Eosinophils Relative: 5 %
HEMATOCRIT: 35.7 % — AB (ref 38.4–49.9)
Hemoglobin: 12.2 g/dL — ABNORMAL LOW (ref 13.0–17.1)
LYMPHS PCT: 17 %
Lymphs Abs: 1.1 10*3/uL (ref 0.9–3.3)
MCH: 30.2 pg (ref 27.2–33.4)
MCHC: 34.1 g/dL (ref 32.0–36.0)
MCV: 88.3 fL (ref 79.3–98.0)
MONO ABS: 0.7 10*3/uL (ref 0.1–0.9)
MONOS PCT: 11 %
NEUTROS ABS: 4.2 10*3/uL (ref 1.5–6.5)
Neutrophils Relative %: 67 %
Platelet Count: 214 10*3/uL (ref 140–400)
RBC: 4.05 MIL/uL — ABNORMAL LOW (ref 4.20–5.82)
RDW: 13.4 % (ref 11.0–14.6)
WBC Count: 6.3 10*3/uL (ref 4.0–10.3)

## 2017-10-13 MED ORDER — DIPHENHYDRAMINE HCL 50 MG/ML IJ SOLN: 25.0000 mg | Freq: Once | INTRAMUSCULAR | Status: DC

## 2017-10-13 MED ORDER — LORATADINE 10 MG PO TABS
10.0000 mg | ORAL_TABLET | Freq: Every day | ORAL | Status: DC
  Administered 2017-10-13: 10 mg via ORAL

## 2017-10-13 MED ORDER — SODIUM CHLORIDE 0.9 % IV SOLN
280.0000 mg | Freq: Once | INTRAVENOUS | Status: AC
  Administered 2017-10-13: 280 mg via INTRAVENOUS
  Filled 2017-10-13: qty 24

## 2017-10-13 MED ORDER — FAMOTIDINE IN NACL 20-0.9 MG/50ML-% IV SOLN
20.0000 mg | Freq: Once | INTRAVENOUS | Status: AC
  Administered 2017-10-13: 20 mg via INTRAVENOUS

## 2017-10-13 MED ORDER — SODIUM CHLORIDE 0.9 % IV SOLN
Freq: Once | INTRAVENOUS | Status: AC
  Administered 2017-10-13: 12:00:00 via INTRAVENOUS
  Filled 2017-10-13: qty 250

## 2017-10-13 MED ORDER — FAMOTIDINE IN NACL 20-0.9 MG/50ML-% IV SOLN
INTRAVENOUS | Status: AC
  Filled 2017-10-13: qty 50

## 2017-10-13 MED ORDER — SODIUM CHLORIDE 0.9 % IV SOLN
100.0000 mg | Freq: Once | INTRAVENOUS | Status: AC
  Administered 2017-10-13: 100 mg via INTRAVENOUS
  Filled 2017-10-13: qty 20

## 2017-10-13 MED ORDER — LORATADINE 10 MG PO TABS
ORAL_TABLET | ORAL | Status: AC
  Filled 2017-10-13: qty 1

## 2017-10-13 NOTE — Progress Notes (Signed)
Hematology and Oncology Follow Up Visit  [name] [id number] February 04, 1946 72 y.o. 10/13/2017 9:51 AM [name], MDElkins, [name], *   Principle Diagnosis: 72 year old man with renal cell carcinoma diagnosed in December 2018 and subsequently developed stage IV with disease to the liver as well as peritoneum in July 2019.  He was initially diagnosed with a clear cell carcinoma of the kidney in December 2018.   Prior Therapy: He underwent a radical nephrectomy completed by Dr. Louis Meckel on February 01, 2015.  The final pathology revealed a 3.3 cm clear cell renal cell carcinoma with Fuhrman grade 3.  Margins were negative indicating stage T1 a disease    Current therapy: Nivolumab at 3 mg/mg and ipilimumab at 1 mg/kg cycle 1 started on 09/21/2017.  Interim History: [name] is here for a follow-up visit.  Since the last visit, he received the first cycle of combined immunotherapy without any complications.  He reported mild fatigue that resolved spontaneously.  He did have slight erythema on his chest wall but no pruritus or diffuse rash.  He denies any change in his bowel habits denies any diarrhea, hematochezia or early satiety.  His performance status and activity level remain unchanged.  He denies any joint pain or discomfort.   He does not report any headaches, blurry vision, syncope or seizures.  He denies any dizziness or alteration in mental status.  Does not report any fevers, chills or sweats.  Does not report any cough, wheezing or hemoptysis.  Does not report any chest pain, palpitation, orthopnea or leg edema.  Does not report any nausea, vomiting or abdominal pain.  Does not report any changes in his bowel habits.    He denies any hematochezia or melena.  Does not report any arthralgias or myalgias.    Does not report frequency, urgency or hematuria.  Does not report any skin rashes or lesions. Does not report any heat or cold intolerance.  Does not report any  lymphadenopathy or petechiae.  Does not report any bleeding clotting tendencies.  He denies any mood changes.  Remaining review of systems is negative.    Medications: I have reviewed the patient's current medications.  Current Outpatient Medications  Medication Sig Dispense Refill  . acetaminophen (TYLENOL) 500 MG tablet Take 1,000 mg by mouth daily.     Marland Kitchen amLODipine (NORVASC) 5 MG tablet Take 5 mg by mouth daily.    Marland Kitchen aspirin 81 MG chewable tablet Chew 81 mg by mouth daily.    Marland Kitchen lisinopril-hydrochlorothiazide (PRINZIDE,ZESTORETIC) 20-25 MG tablet Take 1 tablet by mouth daily.    . prochlorperazine (COMPAZINE) 10 MG tablet Take 1 tablet (10 mg total) by mouth every 6 (six) hours as needed for nausea or vomiting. 30 tablet 0  . ranitidine (ZANTAC) 150 MG capsule Take 150 mg by mouth daily.    . traZODone (DESYREL) 150 MG tablet Take 150 mg by mouth at bedtime.     No current facility-administered medications for this visit.      Allergies: No Known Allergies  Past Medical History, Surgical history, Social history, and Family History were reviewed and updated.    Physical Exam:  Blood pressure 136/76, pulse 73, temperature 97.8 F (36.6 C), temperature source Oral, resp. rate 17, height 5\' 9"  (1.753 m), weight 209 lb 1.6 oz (94.8 kg), SpO2 95 %.    ECOG: 0 General appearance: Comfortable appearing without any discomfort Head: Normocephalic without any trauma Oropharynx: Mucous membranes are moist and pink without any thrush  or ulcers. Eyes: Pupils are equal and round reactive to light. Lymph nodes: No cervical, supraclavicular, inguinal or axillary lymphadenopathy.   Heart:regular rate and rhythm.  S1 and S2 without leg edema. Lung: Clear without any rhonchi or wheezes.  No dullness to percussion. Abdomin: Soft, nontender, nondistended with good bowel sounds.  No hepatosplenomegaly. Musculoskeletal: No joint deformity or effusion.  Full range of motion noted. Neurological: No  deficits noted on motor, sensory and deep tendon reflex exam. Skin: No petechial rash or dryness.  Mild erythema noted on his chest wall. Psychiatric: Mood and affect appeared appropriate without any changes.      Lab Results: Lab Results  Component Value Date   WBC 6.4 09/20/2017   HGB 12.3 (L) 09/20/2017   HCT 36.0 (L) 09/20/2017   MCV 89.2 09/20/2017   PLT 230 09/20/2017     Chemistry      Component Value Date/Time   NA 139 09/20/2017 1319   K 4.2 09/20/2017 1319   CL 104 09/20/2017 1319   CO2 25 09/20/2017 1319   BUN 21 09/20/2017 1319   CREATININE 1.97 (H) 09/20/2017 1319      Component Value Date/Time   CALCIUM 9.1 09/20/2017 1319   ALKPHOS 58 09/20/2017 1319   AST 11 (L) 09/20/2017 1319   ALT 13 09/20/2017 1319   BILITOT 0.4 09/20/2017 1319        Impression and Plan:  72 year old man with:  1.  Stage IV renal cell carcinoma with documented disease to the peritoneum and the liver in July 2019.  He is currently receiving systemic therapy in the form of ipilimumab and nivolumab and has tolerated it without any complications.  He completed the first treatment without any issues.  The natural course of this disease as well as treatment options were reiterated again.  The rationale for using combined immunotherapy versus other oral targeted therapy or combination of those were reviewed.  The plan is to complete 4 cycles of this treatment and repeat imaging studies at that time.  Depending on his response to therapy, he will proceed with nivolumab maintenance after that.  Risks and benefits of this approach was reviewed and is agreeable to continue.  2.  Immune mediated complications: I continue to reiterate complications associated with this therapy which is immune mediated.  His complications would include pneumonitis, colitis, thyroid disease and arthritis.  Rare conditions such as myositis and CNS disease have also been documented.  We will continue to monitor him  closely regarding these issues.  3.  IV access: No issues reported using his peripheral veins.  Port-A-Cath will be used in the future if needed.   4.  Dermatological toxicity: Mild erythema noted on his skin exam without any diffuse rash.  I recommended topical creams and antihistamines if this rash worsens.  5.  Prognosis and goals of care: The goal of treatment remains palliative although long-term disease control has been reported with this treatment.  Aggressive measures are recommended.  6.  Follow-up: We will be in 3 weeks for the next cycle of therapy.  25  minutes was spent with the patient face-to-face today.  More than 50% of time was dedicated to discussing the natural course of his disease, long-term complications associated with this therapy and coordinating his plan of care.   Zola Button, MD 8/28/20199:51 AM

## 2017-10-13 NOTE — Progress Notes (Signed)
Ok to treat per Dr. Alen Blew with elevated Creatinine.

## 2017-10-13 NOTE — Patient Instructions (Signed)
[facility] Discharge Instructions for Patients Receiving Chemotherapy  Today you received the following chemotherapy agents: Nivolumab (Opdivo) and Ipilimumab Curt Bears)  To help prevent nausea and vomiting after your treatment, we encourage you to take your nausea medication as prescribed.    If you develop nausea and vomiting that is not controlled by your nausea medication, call the clinic.   BELOW ARE SYMPTOMS THAT SHOULD BE REPORTED IMMEDIATELY:  *FEVER GREATER THAN 100.5 F  *CHILLS WITH OR WITHOUT FEVER  NAUSEA AND VOMITING THAT IS NOT CONTROLLED WITH YOUR NAUSEA MEDICATION  *UNUSUAL SHORTNESS OF BREATH  *UNUSUAL BRUISING OR BLEEDING  TENDERNESS IN MOUTH AND THROAT WITH OR WITHOUT PRESENCE OF ULCERS  *URINARY PROBLEMS  *BOWEL PROBLEMS  UNUSUAL RASH Items with * indicate a potential emergency and should be followed up as soon as possible.  Feel free to call the clinic should you have any questions or concerns. The clinic phone number is [phone].  Please show the [facility] at check-in to the Emergency Department and triage nurse.  Nivolumab injection What is this medicine? NIVOLUMAB (nye VOL ue mab) is a monoclonal antibody. It is used to treat melanoma, lung cancer, kidney cancer, head and neck cancer, Hodgkin lymphoma, urothelial cancer, colon cancer, and liver cancer. This medicine may be used for other purposes; ask your health care provider or pharmacist if you have questions. COMMON BRAND NAME(S): Opdivo What should I tell my health care provider before I take this medicine? They need to know if you have any of these conditions: -diabetes -immune system problems -kidney disease -liver disease -lung disease -organ transplant -stomach or intestine problems -thyroid disease -an unusual or allergic reaction to nivolumab, other medicines, foods, dyes, or preservatives -pregnant or trying to get pregnant -breast-feeding How should I  use this medicine? This medicine is for infusion into a vein. It is given by a health care professional in a hospital or clinic setting. A special MedGuide will be given to you before each treatment. Be sure to read this information carefully each time. Talk to your pediatrician regarding the use of this medicine in children. While this drug may be prescribed for children as young as 12 years for selected conditions, precautions do apply. Overdosage: If you think you have taken too much of this medicine contact a poison control center or emergency room at once. NOTE: This medicine is only for you. Do not share this medicine with others. What if I miss a dose? It is important not to miss your dose. Call your doctor or health care professional if you are unable to keep an appointment. What may interact with this medicine? Interactions have not been studied. Give your health care provider a list of all the medicines, herbs, non-prescription drugs, or dietary supplements you use. Also tell them if you smoke, drink alcohol, or use illegal drugs. Some items may interact with your medicine. This list may not describe all possible interactions. Give your health care provider a list of all the medicines, herbs, non-prescription drugs, or dietary supplements you use. Also tell them if you smoke, drink alcohol, or use illegal drugs. Some items may interact with your medicine. What should I watch for while using this medicine? This drug may make you feel generally unwell. Continue your course of treatment even though you feel ill unless your doctor tells you to stop. You may need blood work done while you are taking this medicine. Do not become pregnant while taking this medicine or  for 5 months after stopping it. Women should inform their doctor if they wish to become pregnant or think they might be pregnant. There is a potential for serious side effects to an unborn child. Talk to your health care professional  or pharmacist for more information. Do not breast-feed an infant while taking this medicine. What side effects may I notice from receiving this medicine? Side effects that you should report to your doctor or health care professional as soon as possible: -allergic reactions like skin rash, itching or hives, swelling of the face, lips, or tongue -black, tarry stools -blood in the urine -bloody or watery diarrhea -changes in vision -change in sex drive -changes in emotions or moods -chest pain -confusion -cough -decreased appetite -diarrhea -facial flushing -feeling faint or lightheaded -fever, chills -hair loss -hallucination, loss of contact with reality -headache -irritable -joint pain -loss of memory -muscle pain -muscle weakness -seizures -shortness of breath -signs and symptoms of high blood sugar such as dizziness; dry mouth; dry skin; fruity breath; nausea; stomach pain; increased hunger or thirst; increased urination -signs and symptoms of kidney injury like trouble passing urine or change in the amount of urine -signs and symptoms of liver injury like dark yellow or brown urine; general ill feeling or flu-like symptoms; light-colored stools; loss of appetite; nausea; right upper belly pain; unusually weak or tired; yellowing of the eyes or skin -stiff neck -swelling of the ankles, feet, hands -weight gain Side effects that usually do not require medical attention (report to your doctor or health care professional if they continue or are bothersome): -bone pain -constipation -tiredness -vomiting This list may not describe all possible side effects. Call your doctor for medical advice about side effects. You may report side effects to FDA at 1-800-FDA-1088. Where should I keep my medicine? This drug is given in a hospital or clinic and will not be stored at home. NOTE: This sheet is a summary. It may not cover all possible information. If you have questions about this  medicine, talk to your doctor, pharmacist, or health care provider.  2018 Elsevier/Gold Standard (2015-11-11 17:49:34)  Ipilimumab injection What is this medicine? IPILIMUMAB (IP i LIM ue mab) is a monoclonal antibody. It is used to treat melanoma, a type of skin cancer. This medicine may be used for other purposes; ask your health care provider or pharmacist if you have questions. COMMON BRAND NAME(S): YERVOY What should I tell my health care provider before I take this medicine? They need to know if you have any of these conditions: -Addison's disease -blood in your stools (black or tarry stools) or if you have blood in your vomit -eye disease, vision problems -history of pancreatitis -history of stomach bleeding -immune system problems -inflammatory bowel disease -kidney disease -liver disease -lupus -myasthenia gravis -organ transplant -rheumatoid arthritis -sarcoidosis -stomach or intestine problems -thyroid disease -tingling of the fingers or toes, or other nerve disorder -an unusual or allergic reaction to ipilimumab, other medicines, foods, dyes, or preservatives -pregnant or trying to get pregnant -breast-feeding How should I use this medicine? This medicine is for infusion into a vein. It is given by a health care professional in a hospital or clinic setting. A special MedGuide will be given to you before each treatment. Be sure to read this information carefully each time. Talk to your pediatrician regarding the use of this medicine in children. While this drug may be prescribed for children as young as 12 years for selected conditions, precautions do apply. Overdosage:  If you think you have taken too much of this medicine contact a poison control center or emergency room at once. NOTE: This medicine is only for you. Do not share this medicine with others. What if I miss a dose? It is important not to miss your dose. Call your doctor or health care professional if you  are unable to keep an appointment. What may interact with this medicine? Interactions are not expected. This list may not describe all possible interactions. Give your health care provider a list of all the medicines, herbs, non-prescription drugs, or dietary supplements you use. Also tell them if you smoke, drink alcohol, or use illegal drugs. Some items may interact with your medicine. What should I watch for while using this medicine? Tell your doctor or healthcare professional if your symptoms do not start to get better or if they get worse. Do not become pregnant while taking this medicine or for 3 months after stopping it. Women should inform their doctor if they wish to become pregnant or think they might be pregnant. There is a potential for serious side effects to an unborn child. Talk to your health care professional or pharmacist for more information. Do not breast-feed an infant while taking this medicine or for 3 months after the last dose. Your condition will be monitored carefully while you are receiving this medicine. You may need blood work done while you are taking this medicine. What side effects may I notice from receiving this medicine? Side effects that you should report to your doctor or health care professional as soon as possible: -allergic reactions like skin rash, itching or hives, swelling of the face, lips, or tongue -black, tarry stools -bloody or watery diarrhea -changes in vision -dizziness -eye pain -fast, irregular heartbeat -feeling anxious -feeling faint or lightheaded, falls -nausea, vomiting -pain, tingling, numbness in the hands or feet -redness, blistering, peeling or loosening of the skin, including inside the mouth -signs and symptoms of liver injury like dark yellow or brown urine; general ill feeling or flu-like symptoms; light-colored stools; loss of appetite; nausea; right upper belly pain; unusually weak or tired; yellowing of the eyes or  skin -unusual bleeding or bruising Side effects that usually do not require medical attention (report to your doctor or health care professional if they continue or are bothersome): -headache -loss of appetite -trouble sleeping This list may not describe all possible side effects. Call your doctor for medical advice about side effects. You may report side effects to FDA at 1-800-FDA-1088. Where should I keep my medicine? This drug is given in a hospital or clinic and will not be stored at home. NOTE: This sheet is a summary. It may not cover all possible information. If you have questions about this medicine, talk to your doctor, pharmacist, or health care provider.  2018 Elsevier/Gold Standard (2015-09-10 11:41:46)

## 2017-10-13 NOTE — Telephone Encounter (Signed)
Gave pt calendar and avs

## 2017-10-20 DIAGNOSIS — H57813 Brow ptosis, bilateral: Secondary | ICD-10-CM | POA: Diagnosis not present

## 2017-10-20 DIAGNOSIS — H26493 Other secondary cataract, bilateral: Secondary | ICD-10-CM | POA: Diagnosis not present

## 2017-10-22 ENCOUNTER — Telehealth: Payer: Self-pay | Admitting: *Deleted

## 2017-10-22 NOTE — Telephone Encounter (Signed)
"  [name] [phone]) calling about my Dad.  The rash is now on his neck and chest.  Small, raised, red bumps actually never went away.  Itching now after receiving second treatment on Wednesday.  We'd like to know what to do.  We have not tried to use anything (OTC) yet for the rash.  He has not used anything new like soaps, lotions detergents to cause this."   Provider recommended topical creams and antihistamines on last weeks F/U visit.  [name] says "He's allergic to Benadryl.  Is there anything else to take?"   Claritin and Zyrtec are OTC antihistamines.  Pharmacist can direct to aisle to select one of the various topical creams or hydrocortisone cream.  Denies further questions or needs at this time. "We'll give this a try and call back if needed."

## 2017-11-03 ENCOUNTER — Other Ambulatory Visit: Payer: Medicare HMO

## 2017-11-03 ENCOUNTER — Telehealth: Payer: Self-pay | Admitting: Oncology

## 2017-11-03 ENCOUNTER — Inpatient Hospital Stay: Payer: Medicare HMO | Admitting: Oncology

## 2017-11-03 ENCOUNTER — Inpatient Hospital Stay: Payer: Medicare HMO

## 2017-11-03 ENCOUNTER — Inpatient Hospital Stay: Payer: Medicare HMO | Attending: Oncology

## 2017-11-03 VITALS — BP 146/72 | HR 68 | Temp 98.0°F | Resp 17 | Ht 69.0 in | Wt 201.1 lb

## 2017-11-03 DIAGNOSIS — C786 Secondary malignant neoplasm of retroperitoneum and peritoneum: Secondary | ICD-10-CM

## 2017-11-03 DIAGNOSIS — R21 Rash and other nonspecific skin eruption: Secondary | ICD-10-CM

## 2017-11-03 DIAGNOSIS — R530 Neoplastic (malignant) related fatigue: Secondary | ICD-10-CM

## 2017-11-03 DIAGNOSIS — C649 Malignant neoplasm of unspecified kidney, except renal pelvis: Secondary | ICD-10-CM

## 2017-11-03 DIAGNOSIS — Z5112 Encounter for antineoplastic immunotherapy: Secondary | ICD-10-CM | POA: Insufficient documentation

## 2017-11-03 DIAGNOSIS — Z905 Acquired absence of kidney: Secondary | ICD-10-CM | POA: Diagnosis not present

## 2017-11-03 DIAGNOSIS — C787 Secondary malignant neoplasm of liver and intrahepatic bile duct: Secondary | ICD-10-CM | POA: Insufficient documentation

## 2017-11-03 LAB — CMP (CANCER CENTER ONLY)
ALK PHOS: 65 U/L (ref 38–126)
ALT: 13 U/L (ref 0–44)
AST: 13 U/L — AB (ref 15–41)
Albumin: 3.7 g/dL (ref 3.5–5.0)
Anion gap: 6 (ref 5–15)
BUN: 21 mg/dL (ref 8–23)
CALCIUM: 9.7 mg/dL (ref 8.9–10.3)
CO2: 30 mmol/L (ref 22–32)
CREATININE: 1.61 mg/dL — AB (ref 0.61–1.24)
Chloride: 105 mmol/L (ref 98–111)
GFR, Est AFR Am: 47 mL/min — ABNORMAL LOW (ref >60)
GFR, Estimated: 40 mL/min — ABNORMAL LOW (ref >60)
Glucose, Bld: 99 mg/dL (ref 70–99)
Potassium: 4.4 mmol/L (ref 3.5–5.1)
Sodium: 141 mmol/L (ref 135–145)
Total Bilirubin: 0.4 mg/dL (ref 0.3–1.2)
Total Protein: 7.4 g/dL (ref 6.5–8.1)

## 2017-11-03 LAB — CBC WITH DIFFERENTIAL (CANCER CENTER ONLY)
Basophils Absolute: 0 10*3/uL (ref 0.0–0.1)
Basophils Relative: 0 %
EOS ABS: 0.4 10*3/uL (ref 0.0–0.5)
EOS PCT: 6 %
HCT: 36.5 % — ABNORMAL LOW (ref 38.4–49.9)
Hemoglobin: 12.2 g/dL — ABNORMAL LOW (ref 13.0–17.1)
LYMPHS ABS: 1.5 10*3/uL (ref 0.9–3.3)
Lymphocytes Relative: 22 %
MCH: 28.6 pg (ref 27.2–33.4)
MCHC: 33.4 g/dL (ref 32.0–36.0)
MCV: 85.5 fL (ref 79.3–98.0)
MONO ABS: 0.9 10*3/uL (ref 0.1–0.9)
MONOS PCT: 13 %
Neutro Abs: 3.9 10*3/uL (ref 1.5–6.5)
Neutrophils Relative %: 59 %
PLATELETS: 258 10*3/uL (ref 140–400)
RBC: 4.27 MIL/uL (ref 4.20–5.82)
RDW: 12.8 % (ref 11.0–14.6)
WBC Count: 6.7 10*3/uL (ref 4.0–10.3)

## 2017-11-03 MED ORDER — SODIUM CHLORIDE 0.9 % IV SOLN
Freq: Once | INTRAVENOUS | Status: AC
  Administered 2017-11-03: 13:00:00 via INTRAVENOUS
  Filled 2017-11-03: qty 250

## 2017-11-03 MED ORDER — LORATADINE 10 MG PO TABS
10.0000 mg | ORAL_TABLET | Freq: Once | ORAL | Status: AC
  Administered 2017-11-03: 10 mg via ORAL

## 2017-11-03 MED ORDER — SODIUM CHLORIDE 0.9 % IV SOLN
1.0300 mg/kg | Freq: Once | INTRAVENOUS | Status: AC
  Administered 2017-11-03: 100 mg via INTRAVENOUS
  Filled 2017-11-03: qty 20

## 2017-11-03 MED ORDER — SODIUM CHLORIDE 0.9 % IV SOLN
2.9300 mg/kg | Freq: Once | INTRAVENOUS | Status: AC
  Administered 2017-11-03: 280 mg via INTRAVENOUS
  Filled 2017-11-03: qty 24

## 2017-11-03 MED ORDER — FAMOTIDINE IN NACL 20-0.9 MG/50ML-% IV SOLN
INTRAVENOUS | Status: AC
  Filled 2017-11-03: qty 50

## 2017-11-03 MED ORDER — FAMOTIDINE IN NACL 20-0.9 MG/50ML-% IV SOLN
20.0000 mg | Freq: Once | INTRAVENOUS | Status: AC
  Administered 2017-11-03: 20 mg via INTRAVENOUS

## 2017-11-03 MED ORDER — LORATADINE 10 MG PO TABS
ORAL_TABLET | ORAL | Status: AC
  Filled 2017-11-03: qty 1

## 2017-11-03 NOTE — Patient Instructions (Signed)
[facility] Discharge Instructions for Patients Receiving Chemotherapy  Today you received the following chemotherapy agents: Nivolumab (Opdivo) and Ipilimumab Curt Bears).  To help prevent nausea and vomiting after your treatment, we encourage you to take your nausea medication as prescribed.    If you develop nausea and vomiting that is not controlled by your nausea medication, call the clinic.   BELOW ARE SYMPTOMS THAT SHOULD BE REPORTED IMMEDIATELY:  *FEVER GREATER THAN 100.5 F  *CHILLS WITH OR WITHOUT FEVER  NAUSEA AND VOMITING THAT IS NOT CONTROLLED WITH YOUR NAUSEA MEDICATION  *UNUSUAL SHORTNESS OF BREATH  *UNUSUAL BRUISING OR BLEEDING  TENDERNESS IN MOUTH AND THROAT WITH OR WITHOUT PRESENCE OF ULCERS  *URINARY PROBLEMS  *BOWEL PROBLEMS  UNUSUAL RASH Items with * indicate a potential emergency and should be followed up as soon as possible.  Feel free to call the clinic should you have any questions or concerns. The clinic phone number is [phone].  Please show the [facility] at check-in to the Emergency Department and triage nurse.

## 2017-11-03 NOTE — Progress Notes (Signed)
[facility] patient monitoring check list completed and sent to be scanned.

## 2017-11-03 NOTE — Progress Notes (Signed)
Hematology and Oncology Follow Up Visit  [name] [id number] Jun 09, 1945 72 y.o. 11/03/2017 11:44 AM [name], MDElkins, [name], *   Principle Diagnosis: 72 year old man with stage IV renal cell carcinoma with documented disease to the liver and peritoneum in July 2019 after he was initially diagnosed in December 2018.    Prior Therapy: He underwent a radical nephrectomy completed by Dr. Louis Meckel on February 01, 2015.  The final pathology revealed a 3.3 cm clear cell renal cell carcinoma with Fuhrman grade 3.  Margins were negative indicating stage T1 a disease    Current therapy: Nivolumab at 3 mg/mg and ipilimumab at 1 mg/kg cycle 1 started on 09/21/2017.  He is scheduled for cycle 3 today.  Interim History: [name] returns today for a visit.  Since the last visit, he received last cycle of therapy without any major complication.  He did report mild skin rash with erythema on his chest and lower extremities with Claritin helping his symptoms.  He does report some grade 1 fatigue but no diarrhea or changes bowel habits.  His appetite is slightly declined but has remained with reasonable performance status.  His activity level has not changed.  He does not report any headaches, blurry vision, syncope or seizures.  He denies any confusion or lethargy.  Does not report any fevers, chills or sweats.  Does not report any cough, wheezing or hemoptysis.  Does not report any chest pain, palpitation, orthopnea or leg edema.  Does not report any nausea, vomiting or abdominal pain.  Does not report any constipation or diarrhea.  Does not report any joint deformity or bone pain.    Does not report frequency, urgency or hematuria.  Does not report any skin rashes or lesions.Does not report any lymphadenopathy or petechiae.  Does not report any lymphadenopathy or petechiae.  He denies any anxiety or depression.  Remaining review of systems is negative.    Medications: I have reviewed the  patient's current medications.  Current Outpatient Medications  Medication Sig Dispense Refill  . amLODipine (NORVASC) 5 MG tablet Take 5 mg by mouth daily.    Marland Kitchen aspirin 81 MG chewable tablet Chew 81 mg by mouth daily.    Marland Kitchen ibuprofen ([employer] IBUPROFEN) 200 MG tablet Take 200 mg by mouth every 8 (eight) hours as needed.    Marland Kitchen lisinopril-hydrochlorothiazide (PRINZIDE,ZESTORETIC) 20-25 MG tablet Take 1 tablet by mouth daily.    . prochlorperazine (COMPAZINE) 10 MG tablet Take 1 tablet (10 mg total) by mouth every 6 (six) hours as needed for nausea or vomiting. 30 tablet 0  . ranitidine (ZANTAC) 150 MG capsule Take 150 mg by mouth daily.    . traZODone (DESYREL) 150 MG tablet Take 150 mg by mouth at bedtime.     No current facility-administered medications for this visit.      Allergies:  Allergies  Allergen Reactions  . Benadryl [Diphenhydramine] Other (See Comments)    Severe restless legs    Past Medical History, Surgical history, Social history, and Family History were reviewed and updated.    Physical Exam:   Blood pressure (!) 146/72, pulse 68, temperature 98 F (36.7 C), temperature source Oral, resp. rate 17, height 5\' 9"  (1.753 m), weight 201 lb 1.6 oz (91.2 kg), SpO2 97 %.    ECOG: 0   General appearance: Alert, awake without any distress. Head: Atraumatic without abnormalities Oropharynx: Without any thrush or ulcers. Eyes: No scleral icterus. Lymph nodes: No lymphadenopathy noted in the cervical,  supraclavicular, or axillary nodes Heart:regular rate and rhythm, without any murmurs or gallops.   Lung: Clear to auscultation without any rhonchi, wheezes or dullness to percussion. Abdomin: Soft, nontender without any shifting dullness or ascites. Musculoskeletal: No clubbing or cyanosis. Neurological: No motor or sensory deficits. Skin: Mild erythema noted on his chest and lower extremities. Psychiatric: Mood and affect appeared normal.        Lab Results: Lab  Results  Component Value Date   WBC 6.7 11/03/2017   HGB 12.2 (L) 11/03/2017   HCT 36.5 (L) 11/03/2017   MCV 85.5 11/03/2017   PLT 258 11/03/2017     Chemistry      Component Value Date/Time   NA 140 10/13/2017 0944   K 4.2 10/13/2017 0944   CL 104 10/13/2017 0944   CO2 28 10/13/2017 0944   BUN 25 (H) 10/13/2017 0944   CREATININE 1.71 (H) 10/13/2017 0944      Component Value Date/Time   CALCIUM 9.3 10/13/2017 0944   ALKPHOS 62 10/13/2017 0944   AST 11 (L) 10/13/2017 0944   ALT 9 10/13/2017 0944   BILITOT 0.4 10/13/2017 0944        Impression and Plan:  72 year old man with:  1.  Renal cell carcinoma diagnosed in 2018 and subsequently developed stage IV disease to the peritoneum and the liver in July 2019.  He continues to tolerate combination immunotherapy with ipilimumab and nivolumab without any complications.  Risks and benefits of continuing this therapy long-term was reviewed today and the plan is to complete 4 cycles of therapy and repeat imaging studies after that.  Maintenance nivolumab will be the next step depending on his response to therapy.  2.  Immune mediated complications: I continue to educate him about long-term complications related to this therapy.  These were include arthritis, colitis and pneumonitis.  He has no major complications noted at this time.  3.  IV access: He continues to use peripheral veins without any issues at this time.  4.  Dermatological toxicity: He is using topical creams which I advised him to continue with antihistamines.  5.  Prognosis and goals of care: Pain remains palliative at this time and aggressive therapy is warranted given his excellent performance status.  6.  Follow-up: We will be in 3 weeks for the next cycle of therapy.  25  minutes was spent with the patient face-to-face today.  More than 50% of time was dedicated to discussing his disease process, options of care and managing complications related to  therapy.   Zola Button, MD 9/18/201911:44 AM

## 2017-11-03 NOTE — Progress Notes (Signed)
Per Dr. Alen Blew, ok to treat with current creatinine.

## 2017-11-03 NOTE — Telephone Encounter (Signed)
Scheduled appt per 9/18 los - gave patient AVS and calender per los.  Central radiology to contact patient with ct scan.

## 2017-11-24 ENCOUNTER — Inpatient Hospital Stay: Payer: Medicare HMO

## 2017-11-24 ENCOUNTER — Inpatient Hospital Stay: Payer: Medicare HMO | Attending: Oncology

## 2017-11-24 ENCOUNTER — Inpatient Hospital Stay (HOSPITAL_BASED_OUTPATIENT_CLINIC_OR_DEPARTMENT_OTHER): Payer: Medicare HMO | Admitting: Oncology

## 2017-11-24 VITALS — BP 130/77 | HR 81 | Temp 98.0°F | Resp 17 | Ht 69.0 in | Wt 203.1 lb

## 2017-11-24 DIAGNOSIS — C786 Secondary malignant neoplasm of retroperitoneum and peritoneum: Secondary | ICD-10-CM

## 2017-11-24 DIAGNOSIS — C649 Malignant neoplasm of unspecified kidney, except renal pelvis: Secondary | ICD-10-CM

## 2017-11-24 DIAGNOSIS — C787 Secondary malignant neoplasm of liver and intrahepatic bile duct: Secondary | ICD-10-CM | POA: Diagnosis not present

## 2017-11-24 DIAGNOSIS — L299 Pruritus, unspecified: Secondary | ICD-10-CM | POA: Diagnosis not present

## 2017-11-24 DIAGNOSIS — C7800 Secondary malignant neoplasm of unspecified lung: Secondary | ICD-10-CM | POA: Diagnosis not present

## 2017-11-24 DIAGNOSIS — Z5112 Encounter for antineoplastic immunotherapy: Secondary | ICD-10-CM | POA: Diagnosis present

## 2017-11-24 DIAGNOSIS — Z905 Acquired absence of kidney: Secondary | ICD-10-CM

## 2017-11-24 LAB — CMP (CANCER CENTER ONLY)
ALT: 24 U/L (ref 0–44)
ANION GAP: 11 (ref 5–15)
AST: 16 U/L (ref 15–41)
Albumin: 3.9 g/dL (ref 3.5–5.0)
Alkaline Phosphatase: 64 U/L (ref 38–126)
BILIRUBIN TOTAL: 0.4 mg/dL (ref 0.3–1.2)
BUN: 20 mg/dL (ref 8–23)
CO2: 26 mmol/L (ref 22–32)
Calcium: 9.4 mg/dL (ref 8.9–10.3)
Chloride: 104 mmol/L (ref 98–111)
Creatinine: 1.71 mg/dL — ABNORMAL HIGH (ref 0.61–1.24)
GFR, EST NON AFRICAN AMERICAN: 38 mL/min — AB (ref >60)
GFR, Est AFR Am: 44 mL/min — ABNORMAL LOW (ref >60)
Glucose, Bld: 168 mg/dL — ABNORMAL HIGH (ref 70–99)
POTASSIUM: 4.6 mmol/L (ref 3.5–5.1)
Sodium: 141 mmol/L (ref 135–145)
Total Protein: 7.4 g/dL (ref 6.5–8.1)

## 2017-11-24 LAB — CBC WITH DIFFERENTIAL (CANCER CENTER ONLY)
Abs Immature Granulocytes: 0.05 10*3/uL (ref 0.00–0.07)
Basophils Absolute: 0 10*3/uL (ref 0.0–0.1)
Basophils Relative: 0 %
EOS ABS: 0.6 10*3/uL — AB (ref 0.0–0.5)
EOS PCT: 6 %
HEMATOCRIT: 39.2 % (ref 39.0–52.0)
Hemoglobin: 12.7 g/dL — ABNORMAL LOW (ref 13.0–17.0)
Immature Granulocytes: 1 %
LYMPHS ABS: 1.3 10*3/uL (ref 0.7–4.0)
Lymphocytes Relative: 12 %
MCH: 28.2 pg (ref 26.0–34.0)
MCHC: 32.4 g/dL (ref 30.0–36.0)
MCV: 87.1 fL (ref 80.0–100.0)
MONO ABS: 0.7 10*3/uL (ref 0.1–1.0)
MONOS PCT: 7 %
Neutro Abs: 7.9 10*3/uL — ABNORMAL HIGH (ref 1.7–7.7)
Neutrophils Relative %: 74 %
Platelet Count: 220 10*3/uL (ref 150–400)
RBC: 4.5 MIL/uL (ref 4.22–5.81)
RDW: 12.9 % (ref 11.5–15.5)
WBC Count: 10.6 10*3/uL — ABNORMAL HIGH (ref 4.0–10.5)
nRBC: 0 % (ref 0.0–0.2)

## 2017-11-24 MED ORDER — FAMOTIDINE IN NACL 20-0.9 MG/50ML-% IV SOLN
INTRAVENOUS | Status: AC
  Filled 2017-11-24: qty 50

## 2017-11-24 MED ORDER — FAMOTIDINE IN NACL 20-0.9 MG/50ML-% IV SOLN
20.0000 mg | Freq: Once | INTRAVENOUS | Status: AC
  Administered 2017-11-24: 20 mg via INTRAVENOUS

## 2017-11-24 MED ORDER — SODIUM CHLORIDE 0.9 % IV SOLN
Freq: Once | INTRAVENOUS | Status: AC
  Administered 2017-11-24: 11:00:00 via INTRAVENOUS
  Filled 2017-11-24: qty 250

## 2017-11-24 MED ORDER — SODIUM CHLORIDE 0.9 % IV SOLN
2.9300 mg/kg | Freq: Once | INTRAVENOUS | Status: AC
  Administered 2017-11-24: 280 mg via INTRAVENOUS
  Filled 2017-11-24: qty 4

## 2017-11-24 MED ORDER — LORATADINE 10 MG PO TABS
ORAL_TABLET | ORAL | Status: AC
  Filled 2017-11-24: qty 1

## 2017-11-24 MED ORDER — SODIUM CHLORIDE 0.9 % IV SOLN
1.0500 mg/kg | Freq: Once | INTRAVENOUS | Status: AC
  Administered 2017-11-24: 100 mg via INTRAVENOUS
  Filled 2017-11-24: qty 20

## 2017-11-24 MED ORDER — LORATADINE 10 MG PO TABS
10.0000 mg | ORAL_TABLET | Freq: Every day | ORAL | Status: DC
  Administered 2017-11-24: 10 mg via ORAL

## 2017-11-24 NOTE — Progress Notes (Signed)
Hematology and Oncology Follow Up Visit  [name] [id number] 02/05/1946 72 y.o. 11/24/2017 10:04 AM [name], MDElkins, [name], *   Principle Diagnosis: 72 year old man with renal cell carcinoma diagnosed in December 2018.  He developed stage IV disease to the liver and peritoneum in July 2019 after he was initially diagnosed in December 2018.    Prior Therapy: He underwent a radical nephrectomy completed by Dr. Louis Meckel on February 01, 2015.  The final pathology revealed a 3.3 cm clear cell renal cell carcinoma with Fuhrman grade 3.  Margins were negative indicating stage T1 a disease    Current therapy: Nivolumab at 3 mg/mg and ipilimumab at 1 mg/kg cycle 1 started on 09/21/2017.  He is here for cycle 4 of therapy.  Interim History: [name] is here for a return evaluation.  Since last visit, he reports no major changes or complaints.  He continues to tolerate nivolumab and ipilimumab without any complications or issues.  His performance status and activity level remains excellent.  He denies any changes in bowel habits or any respiratory complaints.  He denies any excessive fatigue or dermatological changes.  He does not report any headaches, blurry vision, syncope or seizures.  He denies any duration of mental status or dizziness.  Does not report any fevers, chills or sweats.  Does not report any cough, wheezing or hemoptysis.  Does not report any chest pain, palpitation, orthopnea or leg edema.  Does not report any nausea, vomiting or abdominal pain.  Does not report any change in his bowel habits.  Does not report any paralysis or myalgias.   Does not report frequency, urgency or hematuria.  Does not report any skin rashes or lesions. Does not report any lymphadenopathy or petechiae.  Does not report any eating or clotting tendency.  He denies any changes in his mood.  Remaining review of systems is negative.    Medications: I have reviewed the patient's current  medications.  Current Outpatient Medications  Medication Sig Dispense Refill  . amLODipine (NORVASC) 5 MG tablet Take 5 mg by mouth daily.    Marland Kitchen aspirin 81 MG chewable tablet Chew 81 mg by mouth daily.    Marland Kitchen ibuprofen ([employer] IBUPROFEN) 200 MG tablet Take 200 mg by mouth every 8 (eight) hours as needed.    Marland Kitchen lisinopril-hydrochlorothiazide (PRINZIDE,ZESTORETIC) 20-25 MG tablet Take 1 tablet by mouth daily.    . prochlorperazine (COMPAZINE) 10 MG tablet Take 1 tablet (10 mg total) by mouth every 6 (six) hours as needed for nausea or vomiting. 30 tablet 0  . ranitidine (ZANTAC) 150 MG capsule Take 150 mg by mouth daily.    . traZODone (DESYREL) 150 MG tablet Take 150 mg by mouth at bedtime.     No current facility-administered medications for this visit.      Allergies:  Allergies  Allergen Reactions  . Benadryl [Diphenhydramine] Other (See Comments)    Severe restless legs    Past Medical History, Surgical history, Social history, and Family History were reviewed and updated.    Physical Exam:     Blood pressure 130/77, pulse 81, temperature 98 F (36.7 C), temperature source Oral, resp. rate 17, height 5\' 9"  (1.753 m), weight 203 lb 1.6 oz (92.1 kg), SpO2 97 %.    ECOG: 0   General appearance: Comfortable appearing without any discomfort Head: Normocephalic without any trauma Oropharynx: Mucous membranes are moist and pink without any thrush or ulcers. Eyes: Pupils are equal and round reactive to  light. Lymph nodes: No cervical, supraclavicular, inguinal or axillary lymphadenopathy.   Heart:regular rate and rhythm.  S1 and S2 without leg edema. Lung: Clear without any rhonchi or wheezes.  No dullness to percussion. Abdomin: Soft, nontender, nondistended with good bowel sounds.  No hepatosplenomegaly. Musculoskeletal: No joint deformity or effusion.  Full range of motion noted. Neurological: No deficits noted on motor, sensory and deep tendon reflex exam. Skin: No petechial  rash or dryness.  Appeared moist.  Psychiatric: Mood and affect appeared appropriate.          Lab Results: Lab Results  Component Value Date   WBC 6.7 11/03/2017   HGB 12.2 (L) 11/03/2017   HCT 36.5 (L) 11/03/2017   MCV 85.5 11/03/2017   PLT 258 11/03/2017     Chemistry      Component Value Date/Time   NA 141 11/03/2017 1131   K 4.4 11/03/2017 1131   CL 105 11/03/2017 1131   CO2 30 11/03/2017 1131   BUN 21 11/03/2017 1131   CREATININE 1.61 (H) 11/03/2017 1131      Component Value Date/Time   CALCIUM 9.7 11/03/2017 1131   ALKPHOS 65 11/03/2017 1131   AST 13 (L) 11/03/2017 1131   ALT 13 11/03/2017 1131   BILITOT 0.4 11/03/2017 1131        Impression and Plan:  72 year old man with:  1.  Stage IV renal cell carcinoma with disease to the peritoneum and the liver in July 2019.  He is currently receiving combined immunotherapy with ipilimumab and nivolumab without any complications.  The plan is to complete cycle 4 today and repeat CT scan which is scheduled for December 14, 2017.  After that depending on his response he will likely require maintenance nivolumab.  2.  Immune mediated complications: None reported at this time.  I continue to educate him about long-term complications.  3.  IV access: No issues reported with his peripheral veins.  4.  Dermatological toxicity: Very little pruritus noted.  No rashes.  5.  Prognosis and goals of care: His disease is incurable but lung disease responses have been documented with this therapy.  His performance status is excellent and aggressive therapy is warranted.  6.  Follow-up: We will be in 3 weeks for repeat imaging studies and potentially start maintenance nivolumab.  15  minutes was spent with the patient face-to-face today.  More than 50% of time was dedicated to reviewing his disease status, long-term treatment complications and managing plan of care.   Zola Button, MD 10/9/201910:04 AM

## 2017-11-24 NOTE — Progress Notes (Signed)
Ok to treat with 11/24/17 Creatinine per Dr. Alen Blew.

## 2017-11-24 NOTE — Progress Notes (Signed)
[facility] Patient Monitoring Checklist completed. Dr. Alen Blew made aware of reporting findings. Okay to proceed with treatment as scheduled. [facility] checklist to be scanned in.

## 2017-11-24 NOTE — Patient Instructions (Signed)
[facility] Discharge Instructions for Patients Receiving Chemotherapy  Today you received the following chemotherapy agents [name]  To help prevent nausea and vomiting after your treatment, we encourage you to take your nausea medication as directed   If you develop nausea and vomiting that is not controlled by your nausea medication, call the clinic.   BELOW ARE SYMPTOMS THAT SHOULD BE REPORTED IMMEDIATELY:  *FEVER GREATER THAN 100.5 F  *CHILLS WITH OR WITHOUT FEVER  NAUSEA AND VOMITING THAT IS NOT CONTROLLED WITH YOUR NAUSEA MEDICATION  *UNUSUAL SHORTNESS OF BREATH  *UNUSUAL BRUISING OR BLEEDING  TENDERNESS IN MOUTH AND THROAT WITH OR WITHOUT PRESENCE OF ULCERS  *URINARY PROBLEMS  *BOWEL PROBLEMS  UNUSUAL RASH Items with * indicate a potential emergency and should be followed up as soon as possible.  Feel free to call the clinic should you have any questions or concerns. The clinic phone number is [phone].  Please show the [facility] at check-in to the Emergency Department and triage nurse.

## 2017-11-25 ENCOUNTER — Telehealth: Payer: Self-pay

## 2017-11-25 NOTE — Telephone Encounter (Signed)
Printed avs and calender of upcoming appointment. Per 10/9 los

## 2017-11-30 DIAGNOSIS — E785 Hyperlipidemia, unspecified: Secondary | ICD-10-CM | POA: Insufficient documentation

## 2017-11-30 NOTE — Progress Notes (Signed)
[name]    Date of visit:  12/04/2016 DOB:  05/01/1945    Age:  72 yrs. Medical record number:  [id number]     Account number:  [id number] Primary Care Provider: Claris Gower ____________________________ CURRENT DIAGNOSES  1. CAD Native without angina  2. Hypertensive heart disease without heart failure  3. Ventricular premature depolarization  4. Obesity  5. Hyperlipidemia  6. Personal history of other malignant neoplasm of bronchus and lung  ____________________________ ALLERGIES  Atorvastatin, Muscle aches  Pravastatin, Muscle aches  Simvastatin, Muscle aches ____________________________ MEDICATIONS  1. amlodipine 5 mg tablet, 1 p.o. daily  2. aspirin 81 mg tablet,chewable, 1 p.o. daily  3. lisinopril 20 mg-hydrochlorothiazide 25 mg tablet, 1 p.o. daily  4. nitroglycerin 0.4 mg tablet, sublingual, Take as directed  5. ranitidine 150 mg capsule, 1 to 2 tablets p.o. daily  6. trazodone 150 mg tablet, QHS ____________________________ HISTORY OF PRESENT ILLNESS  Patient seen for cardiac followup. He has had a reasonably good year since he was here. He has not had angina and has his usual amount of dyspnea that he has had since he had his previous lung surgery. He has not had to use nitroglycerin. He is intolerant to statin therapy. He denies PND, orthopnea, syncope, palpitations, or claudication. He does not check his blood pressures at home. Blood pressure is mildly elevated. Lipids were checked today and showed mild elevation of cholesterol. He has lost about 10 pounds of weight. He is not interested in further treatment of his cholesterol. He is having suggestive of carpal tunnel in his arms. ____________________________ PAST HISTORY  Past Medical Illnesses:  hypertension, hyperlipidemia, history of lung cancer treated with surgery, obesity, renal cancer treated with surgery December 2016;  Cardiovascular Illnesses:  CAD;  Infectious Diseases:  no previous history of significant  infectious diseases;  Surgical Procedures:  appendectomy, cholecystectomy, pneumonectomy-rt, pilonidal cyst, nephrectomy on the right 2016;  Trauma History:  no previous history of significant trauma;  NYHA Classification:  I;  Canadian Angina Classification:  Class 1: Angina with strenuous Exercise;  Cardiology Procedures-Invasive:  cardiac cath (left) May 2010;  Cardiology Procedures-Noninvasive:  treadmill cardiolite May 2010, event monitor February 2013, echocardiogram March 2013, treadmill Myoview August 2014, lexiscan Myoview April 2018;  Cardiac Cath Results:  normal Left main, scattered irregularities LAD, 50% stenosis proximal CFX, occluded RCA, left to right collateral, occluded Diag 1, 80% stenosis proximal Ramus;  LVEF of 77% documented via nuclear study on 05/28/2016,   ____________________________ CARDIO-PULMONARY TEST DATES EKG Date:  05/26/2016;   Cardiac Cath Date:  06/27/2008;  Nuclear Study Date:  05/28/2016;  Echocardiography Date: 04/22/2011;  Chest Xray Date: 09/09/2012;   ____________________________ FAMILY HISTORY Brother -- Brother dead, Liver disease Brother -- Brother dead, Pharmacologist -- Brother dead, Cancer Brother -- Brother dead, Cancer Brother -- Brother dead, Cancer Father -- Father dead, Coronary Artery Disease Mother -- Mother dead, Parkinsonism ____________________________ SOCIAL HISTORY Alcohol Use:  occasionally;  Smoking:  30 pack year history, used to smoke but quit 1993;  Diet:  regular diet;  Lifestyle:  married and 2 daughters;  Exercise:  exercises regularly and walking;  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 Respiratory: denies dyspnea, cough, wheezing or hemoptysis. Cardiovascular:   please review HPI Abdominal: denies dyspepsia,  GI bleeding, constipation, or diarrhea Genitourinary-Male: nocturia  Musculoskeletal:  chronic back pain Neurological:  numbness of arms and hands  ____________________________ PHYSICAL EXAMINATION VITAL SIGNS  Blood Pressure:  134/80 Sitting, Left arm, regular cuff  , 144/74 Standing, Left arm and regular cuff   Pulse:  72/min. Weight:  210.00 lbs. Height:  69.00"BMI: 31  Constitutional:  pleasant white male in no acute distress, mildly obese Skin:  warm and dry to touch, no apparent skin lesions, or masses noted. Head:  normocephalic, balding male hair pattern Neck:  supple, without massess. No JVD, thyromegaly or carotid bruits. Carotid upstroke normal. Chest:  normal symmetry, clear to auscultation Cardiac:  regular rhythm, normal S1 and S2, No S3 or S4, no murmurs, gallops or rubs detected. Peripheral Pulses:  femoral pulses 2+, posterior tibial pulses 2+, popliteal pulses 1+, no bruits Extremities & Back:  no deformities, clubbing, cyanosis, erythema or edema observed. Normal muscle strength and tone. Neurological:  no gross motor or sensory deficits noted, affect appropriate, oriented x3. ____________________________ MOST RECENT LIPID PANEL 12/04/16  CHOL TOTL 223 mg/dl, LDL N/A NM, HDL 19 mg/dl, TRIGLYCER 406 mg/dl and CHOL/HDL 11.9 (Calc) ____________________________ IMPRESSIONS/PLAN  1. Coronary artery disease with occlusion of the right coronary artery without angina 2. Hypertensive heart disease blood pressure above goal 3. Hyperlipidemia with statin intolerance doesn't desire further treatment 4. Probable carpal tunnel syndrome  Recommendations:  If numbness gets worse may consider treatment for carpal tunnel. He refuses additional cholesterol medications. Advise further weight loss. Followup in one year and call if problems. ____________________________ TODAYS ORDERS  1. Lipid Panel: Today  2. Return Visit: 1 year                        ____________________________ Cardiology Physician:  Kerry Hough MD [facility]

## 2017-12-10 DIAGNOSIS — H6990 Unspecified Eustachian tube disorder, unspecified ear: Secondary | ICD-10-CM | POA: Diagnosis not present

## 2017-12-14 ENCOUNTER — Ambulatory Visit (HOSPITAL_COMMUNITY)
Admission: RE | Admit: 2017-12-14 | Discharge: 2017-12-14 | Disposition: A | Discharge status: Home / Self Care | Payer: Medicare HMO | Source: Ambulatory Visit | Attending: Oncology | Admitting: Oncology

## 2017-12-14 ENCOUNTER — Other Ambulatory Visit: Payer: Self-pay

## 2017-12-14 ENCOUNTER — Encounter (HOSPITAL_COMMUNITY): Payer: Self-pay

## 2017-12-14 DIAGNOSIS — Z902 Acquired absence of lung [part of]: Secondary | ICD-10-CM | POA: Diagnosis not present

## 2017-12-14 DIAGNOSIS — Z905 Acquired absence of kidney: Secondary | ICD-10-CM | POA: Diagnosis not present

## 2017-12-14 DIAGNOSIS — K669 Disorder of peritoneum, unspecified: Secondary | ICD-10-CM | POA: Diagnosis not present

## 2017-12-14 DIAGNOSIS — C649 Malignant neoplasm of unspecified kidney, except renal pelvis: Secondary | ICD-10-CM | POA: Diagnosis not present

## 2017-12-14 DIAGNOSIS — C7800 Secondary malignant neoplasm of unspecified lung: Secondary | ICD-10-CM | POA: Diagnosis not present

## 2017-12-14 DIAGNOSIS — C787 Secondary malignant neoplasm of liver and intrahepatic bile duct: Secondary | ICD-10-CM | POA: Insufficient documentation

## 2017-12-14 DIAGNOSIS — Z85118 Personal history of other malignant neoplasm of bronchus and lung: Secondary | ICD-10-CM | POA: Diagnosis not present

## 2017-12-14 DIAGNOSIS — C772 Secondary and unspecified malignant neoplasm of intra-abdominal lymph nodes: Secondary | ICD-10-CM | POA: Insufficient documentation

## 2017-12-14 DIAGNOSIS — C641 Malignant neoplasm of right kidney, except renal pelvis: Secondary | ICD-10-CM | POA: Diagnosis not present

## 2017-12-14 DIAGNOSIS — C7801 Secondary malignant neoplasm of right lung: Secondary | ICD-10-CM | POA: Diagnosis not present

## 2017-12-14 DIAGNOSIS — R911 Solitary pulmonary nodule: Secondary | ICD-10-CM | POA: Diagnosis not present

## 2017-12-14 MED ORDER — SODIUM CHLORIDE 0.9 % IJ SOLN
INTRAMUSCULAR | Status: AC
  Filled 2017-12-14: qty 50

## 2017-12-14 MED ORDER — IOHEXOL 300 MG/ML  SOLN
100.0000 mL | Freq: Once | INTRAMUSCULAR | Status: AC | PRN
  Administered 2017-12-14: 80 mL via INTRAVENOUS

## 2017-12-15 ENCOUNTER — Inpatient Hospital Stay: Payer: Medicare HMO

## 2017-12-15 ENCOUNTER — Inpatient Hospital Stay (HOSPITAL_BASED_OUTPATIENT_CLINIC_OR_DEPARTMENT_OTHER): Payer: Medicare HMO | Admitting: Oncology

## 2017-12-15 ENCOUNTER — Telehealth: Payer: Self-pay

## 2017-12-15 VITALS — BP 131/72 | HR 78 | Temp 98.1°F | Resp 18 | Ht 69.0 in | Wt 201.6 lb

## 2017-12-15 DIAGNOSIS — C786 Secondary malignant neoplasm of retroperitoneum and peritoneum: Secondary | ICD-10-CM

## 2017-12-15 DIAGNOSIS — L299 Pruritus, unspecified: Secondary | ICD-10-CM | POA: Diagnosis not present

## 2017-12-15 DIAGNOSIS — C649 Malignant neoplasm of unspecified kidney, except renal pelvis: Secondary | ICD-10-CM

## 2017-12-15 DIAGNOSIS — C787 Secondary malignant neoplasm of liver and intrahepatic bile duct: Secondary | ICD-10-CM

## 2017-12-15 DIAGNOSIS — Z905 Acquired absence of kidney: Secondary | ICD-10-CM | POA: Diagnosis not present

## 2017-12-15 DIAGNOSIS — C7800 Secondary malignant neoplasm of unspecified lung: Secondary | ICD-10-CM

## 2017-12-15 DIAGNOSIS — Z5112 Encounter for antineoplastic immunotherapy: Secondary | ICD-10-CM | POA: Diagnosis not present

## 2017-12-15 LAB — CBC WITH DIFFERENTIAL (CANCER CENTER ONLY)
ABS IMMATURE GRANULOCYTES: 0.01 10*3/uL (ref 0.00–0.07)
BASOS PCT: 1 %
Basophils Absolute: 0 10*3/uL (ref 0.0–0.1)
Eosinophils Absolute: 0.7 10*3/uL — ABNORMAL HIGH (ref 0.0–0.5)
Eosinophils Relative: 12 %
HCT: 42.2 % (ref 39.0–52.0)
Hemoglobin: 13.9 g/dL (ref 13.0–17.0)
Immature Granulocytes: 0 %
Lymphocytes Relative: 33 %
Lymphs Abs: 1.8 10*3/uL (ref 0.7–4.0)
MCH: 28.5 pg (ref 26.0–34.0)
MCHC: 32.9 g/dL (ref 30.0–36.0)
MCV: 86.5 fL (ref 80.0–100.0)
MONO ABS: 0.7 10*3/uL (ref 0.1–1.0)
Monocytes Relative: 13 %
NEUTROS ABS: 2.3 10*3/uL (ref 1.7–7.7)
NEUTROS PCT: 41 %
PLATELETS: 191 10*3/uL (ref 150–400)
RBC: 4.88 MIL/uL (ref 4.22–5.81)
RDW: 13.4 % (ref 11.5–15.5)
WBC: 5.6 10*3/uL (ref 4.0–10.5)
nRBC: 0 % (ref 0.0–0.2)

## 2017-12-15 LAB — CMP (CANCER CENTER ONLY)
ALT: 30 U/L (ref 0–44)
AST: 24 U/L (ref 15–41)
Albumin: 3.9 g/dL (ref 3.5–5.0)
Alkaline Phosphatase: 58 U/L (ref 38–126)
Anion gap: 9 (ref 5–15)
BILIRUBIN TOTAL: 0.5 mg/dL (ref 0.3–1.2)
BUN: 17 mg/dL (ref 8–23)
CO2: 28 mmol/L (ref 22–32)
Calcium: 9.7 mg/dL (ref 8.9–10.3)
Chloride: 104 mmol/L (ref 98–111)
Creatinine: 2.02 mg/dL — ABNORMAL HIGH (ref 0.61–1.24)
GFR, EST NON AFRICAN AMERICAN: 31 mL/min — AB (ref >60)
GFR, Est AFR Am: 36 mL/min — ABNORMAL LOW (ref >60)
Glucose, Bld: 97 mg/dL (ref 70–99)
POTASSIUM: 4.2 mmol/L (ref 3.5–5.1)
Sodium: 141 mmol/L (ref 135–145)
TOTAL PROTEIN: 7.7 g/dL (ref 6.5–8.1)

## 2017-12-15 MED ORDER — LORATADINE 10 MG PO TABS
10.0000 mg | ORAL_TABLET | Freq: Every day | ORAL | Status: DC
  Administered 2017-12-15: 10 mg via ORAL

## 2017-12-15 MED ORDER — LORATADINE 10 MG PO TABS
ORAL_TABLET | ORAL | Status: AC
  Filled 2017-12-15: qty 1

## 2017-12-15 MED ORDER — SODIUM CHLORIDE 0.9 % IV SOLN
Freq: Once | INTRAVENOUS | Status: AC
  Administered 2017-12-15: 13:00:00 via INTRAVENOUS
  Filled 2017-12-15: qty 250

## 2017-12-15 MED ORDER — SODIUM CHLORIDE 0.9 % IV SOLN
240.0000 mg | Freq: Once | INTRAVENOUS | Status: AC
  Administered 2017-12-15: 240 mg via INTRAVENOUS
  Filled 2017-12-15: qty 24

## 2017-12-15 NOTE — Progress Notes (Signed)
Per Dr. Alen Blew okay to treat with crt of 2.02

## 2017-12-15 NOTE — Progress Notes (Signed)
Hematology and Oncology Follow Up Visit  [name] [id number] August 04, 1945 72 y.o. 12/15/2017 12:13 PM [name], MDElkins, [name], *   Principle Diagnosis: 72 year old man with stage IV renal cell carcinoma with disease to the liver, lung and peritoneum diagnosed in December 2018.    Prior Therapy: He underwent a radical nephrectomy completed by Dr. Louis Meckel on February 01, 2015.  The final pathology revealed a 3.3 cm clear cell renal cell carcinoma with Fuhrman grade 3.  Margins were negative indicating stage T1 a disease    Current therapy: Nivolumab at 3 mg/mg and ipilimumab at 1 mg/kg cycle 1 started on 09/21/2017.  He completed 4 cycles of therapy.  He is here for nivolumab maintenance therapy every 2 weeks.  Interim History: [name] presents today for a follow-up.  Since last visit, he received the first cycle of combined immunotherapy without any major complications.  He does report some pruritus and grade 1 rash that has been manageable with topical antihistamines.  He denies any excessive fatigue, tiredness or changes in his performance status.  He denies any recent hospitalizations or illnesses.  He denies any difficulty breathing or changes in his mentation.  His quality of life remains unchanged.  His appetite is also maintained.  He does not report any headaches, blurry vision, syncope or seizures.  He denies any confusion or lethargy.  Does not report any fevers, chills or sweats.  Does not report any cough, wheezing or hemoptysis.  Does not report any chest pain, palpitation, orthopnea or leg edema.  Does not report any nausea, vomiting or abdominal pain.  Does not report any constipation, diarrhea or mucus in his stool.  Does not report any bone pain or pathological fractures..   Does not report frequency, urgency or hematuria.  Does not report any ecchymosis or petechiae.  Does not report any lymphadenopathy or easy bruising.  Denies any anxiety or depression.   Remaining review of systems is negative.    Medications: I have reviewed the patient's current medications.  Current Outpatient Medications  Medication Sig Dispense Refill  . amLODipine (NORVASC) 5 MG tablet Take 5 mg by mouth daily.    Marland Kitchen aspirin 81 MG chewable tablet Chew 81 mg by mouth daily.    Marland Kitchen ibuprofen ([employer] IBUPROFEN) 200 MG tablet Take 200 mg by mouth every 8 (eight) hours as needed.    Marland Kitchen lisinopril-hydrochlorothiazide (PRINZIDE,ZESTORETIC) 20-25 MG tablet Take 1 tablet by mouth daily.    . prochlorperazine (COMPAZINE) 10 MG tablet Take 1 tablet (10 mg total) by mouth every 6 (six) hours as needed for nausea or vomiting. 30 tablet 0  . ranitidine (ZANTAC) 150 MG capsule Take 150 mg by mouth daily.    . traZODone (DESYREL) 150 MG tablet Take 150 mg by mouth at bedtime.     No current facility-administered medications for this visit.      Allergies:  Allergies  Allergen Reactions  . Benadryl [Diphenhydramine] Other (See Comments)    Severe restless legs    Past Medical History, Surgical history, Social history, and Family History were reviewed and updated.    Physical Exam:     Blood pressure 131/72, pulse 78, temperature 98.1 F (36.7 C), temperature source Oral, resp. rate 18, height 5\' 9"  (1.753 m), weight 201 lb 9.6 oz (91.4 kg), SpO2 98 %.    ECOG: 0   General appearance: Alert, awake without any distress. Head: Atraumatic without abnormalities Oropharynx: Without any thrush or ulcers. Eyes: No scleral icterus.  Lymph nodes: No lymphadenopathy noted in the cervical, supraclavicular, or axillary nodes Heart:regular rate and rhythm, without any murmurs or gallops.   Lung: Clear to auscultation without any rhonchi, wheezes or dullness to percussion. Abdomin: Soft, nontender without any shifting dullness or ascites. Musculoskeletal: No clubbing or cyanosis. Neurological: No motor or sensory deficits. Skin: Mild erythema noted on his chest wall and legs.              Lab Results: Lab Results  Component Value Date   WBC 5.6 12/15/2017   HGB 13.9 12/15/2017   HCT 42.2 12/15/2017   MCV 86.5 12/15/2017   PLT 191 12/15/2017     Chemistry      Component Value Date/Time   NA 141 12/15/2017 1121   K 4.2 12/15/2017 1121   CL 104 12/15/2017 1121   CO2 28 12/15/2017 1121   BUN 17 12/15/2017 1121   CREATININE 2.02 (H) 12/15/2017 1121      Component Value Date/Time   CALCIUM 9.7 12/15/2017 1121   ALKPHOS 58 12/15/2017 1121   AST 24 12/15/2017 1121   ALT 30 12/15/2017 1121   BILITOT 0.5 12/15/2017 1121     CLINICAL DATA:  Right lung cancer status post resection. Metastatic renal cancer status post nephrectomy with lung metastases.  EXAM: CT CHEST WITH CONTRAST  CT ABDOMEN AND PELVIS WITHOUT AND WITH CONTRAST  TECHNIQUE: Multidetector CT imaging of the abdomen and pelvis was performed following the standard protocol before and during bolus administration of intravenous contrast. Multidetector CT imaging of the chest was performed following the standard protocol during bolus administration of intravenous contrast.  CONTRAST:  46mL OMNIPAQUE IOHEXOL 300 MG/ML  SOLN  COMPARISON:  CT chest dated 09/09/2017. CT abdomen/pelvis dated 08/03/2017.  FINDINGS: CT CHEST FINDINGS  Cardiovascular: Heart is normal in size.  No pericardial effusion.  No evidence of thoracic aortic aneurysm. Ectasia of the ascending thoracic aorta, measuring 3.7 cm, unchanged. Atherosclerotic calcifications of the aortic arch.  Three vessel coronary atherosclerosis.  Mediastinum/Nodes: No suspicious mediastinal lymphadenopathy.  Visualized thyroid is unremarkable.  Lungs/Pleura: Status post right lower lobectomy.  Residual 8 mm central left lower lobe nodule/metastasis (series 15/image 78), previously 1.6 x 1.8 cm. Prior 6 mm left upper lobe nodule has resolved.  No focal consolidation.  No pleural effusion or  pneumothorax.  Musculoskeletal: Mild degenerative changes of the mid thoracic spine.  CT ABDOMEN PELVIS FINDINGS  Hepatobiliary: Chronic 4.9 x 3.9 cm liver lesion in segment 4A (series 11/image 37), unchanged from 2017, benign  Residual 13 mm hypoenhancing lesion along the posterior aspect of segment 6 adjacent to the hepatic renal fossa (series 16/image 9), corresponding to the prior 3.1 x 3.2 cm heterogeneous enhancing lesion in this region, favoring improving metastasis.  Status post cholecystectomy. No intrahepatic or extrahepatic ductal dilatation.  Pancreas: Within normal limits.  Spleen: Within limits.  Adrenals/Urinary Tract: Adrenal glands within normal limits.  Status post right nephrectomy with postsurgical changes/fat packing in the surgical bed.  7 mm interpolar left renal cyst (series 16/image 39). No enhancing renal lesions. No hydronephrosis.  Bladder is mildly thick-walled although underdistended.  Stomach/Bowel: Stomach is within normal limits.  No evidence of bowel obstruction.  Appendix is not discretely visualized.  Extensive colonic diverticulosis, without evidence of diverticulitis.  Vascular/Lymphatic: No evidence of abdominal aortic aneurysm.  Atherosclerotic calcifications of the abdominal aorta and branch vessels.  14 mm short axis portacaval node (series 16/image 58), unchanged, at the upper limits of normal.  11 mm short  axis retrocaval node (series 16/image 62), previously 2.9 cm.  Reproductive: Prostate is grossly unremarkable.  Other: No abdominopelvic ascites.  1.2 x 1.9 cm peritoneal implant beneath the left mid abdominal wall (series 16/image 36), previously 2.7 x 4.2 cm.  Musculoskeletal: Mild degenerative changes at L5-S1.  IMPRESSION: Status post right nephrectomy for renal cell cancer. Status post right lower lobectomy for lung cancer.  Improving left lung metastases, with a residual 8 mm  nodule in the left lower lobe.  Improving hepatic metastasis along the posterior aspect of segment 6, now measuring 1.3 cm.  Improving upper abdominal nodal metastases, including an 11 mm short axis retrocaval node.  Improving peritoneal implant beneath the left mid abdominal wall, now measuring 1.2 x 1.9 cm.    Impression and Plan:  72 year old man with:  1.  Renal cell carcinoma with metastatic disease to the lung, peritoneum and liver noted in July 2019.  He has tolerated 4 cycles of ipilimumab and nivolumab without any major complications.  CT scan on 12/14/2017 was personally reviewed and showed excellent response to therapy.  He has significant improvement in his pulmonary, hepatic and peritoneal implants.  The natural course of his disease and treatment options moving forward were reviewed at this time.  I have recommended continuing nivolumab maintenance moving forward and repeat imaging studies in 2 to 3 months.  He is agreeable to continue at this time.  2.  Immune mediated complications: Grade 1 dermatological toxicities noted.  We have discussed strategies to improve that with topical antihistamines and steroids.  3.  IV access: We will continue to use his peripheral veins for the time being.  Port-A-Cath will be deferred for the time being.  4.  Dermatological toxicity: Manageable at this time without any oral steroids.  5.  Prognosis and goals of care: He had an excellent response to therapy although his disease remains incurable.  Aggressive therapy is warranted at this time.  6.  Follow-up: We will be every 2 weeks with nivolumab with MD follow-up in 4 weeks.  25  minutes was spent with the patient face-to-face today.  More than 50% of time was dedicated to discussing his disease status, reviewing imaging studies and coordinating plan of care.   Zola Button, MD 10/30/201912:13 PM

## 2017-12-15 NOTE — Telephone Encounter (Signed)
Printed avs and calender of upcoming appointment. Per 10/30 los 

## 2017-12-15 NOTE — Patient Instructions (Signed)
[facility] Discharge Instructions for Patients Receiving Chemotherapy  Today you received the following chemotherapy agents: Nivolumab  To help prevent nausea and vomiting after your treatment, we encourage you to take your nausea medication as directed.    If you develop nausea and vomiting that is not controlled by your nausea medication, call the clinic.   BELOW ARE SYMPTOMS THAT SHOULD BE REPORTED IMMEDIATELY:  *FEVER GREATER THAN 100.5 F  *CHILLS WITH OR WITHOUT FEVER  NAUSEA AND VOMITING THAT IS NOT CONTROLLED WITH YOUR NAUSEA MEDICATION  *UNUSUAL SHORTNESS OF BREATH  *UNUSUAL BRUISING OR BLEEDING  TENDERNESS IN MOUTH AND THROAT WITH OR WITHOUT PRESENCE OF ULCERS  *URINARY PROBLEMS  *BOWEL PROBLEMS  UNUSUAL RASH Items with * indicate a potential emergency and should be followed up as soon as possible.  Feel free to call the clinic should you have any questions or concerns. The clinic phone number is [phone].  Please show the CHEMO ALERT CARD at check-in to the Emergency Department and triage nurse.   

## 2017-12-15 NOTE — Progress Notes (Signed)
Okay to treat with Creat of 2.02 per Dr. Alen Blew.

## 2017-12-21 DIAGNOSIS — Z87891 Personal history of nicotine dependence: Secondary | ICD-10-CM | POA: Diagnosis not present

## 2017-12-21 DIAGNOSIS — R569 Unspecified convulsions: Secondary | ICD-10-CM | POA: Diagnosis not present

## 2017-12-21 DIAGNOSIS — R42 Dizziness and giddiness: Secondary | ICD-10-CM | POA: Diagnosis not present

## 2017-12-21 DIAGNOSIS — Z79899 Other long term (current) drug therapy: Secondary | ICD-10-CM | POA: Diagnosis not present

## 2017-12-21 DIAGNOSIS — I959 Hypotension, unspecified: Secondary | ICD-10-CM | POA: Diagnosis not present

## 2017-12-21 DIAGNOSIS — I10 Essential (primary) hypertension: Secondary | ICD-10-CM | POA: Diagnosis not present

## 2017-12-21 DIAGNOSIS — R531 Weakness: Secondary | ICD-10-CM | POA: Diagnosis not present

## 2017-12-21 DIAGNOSIS — R55 Syncope and collapse: Secondary | ICD-10-CM | POA: Diagnosis not present

## 2017-12-23 ENCOUNTER — Telehealth: Payer: Self-pay | Admitting: *Deleted

## 2017-12-23 NOTE — Telephone Encounter (Signed)
Received TC from pt's daughter, [name]. She states that her father has been c/o feeling fatigued and weak the last 3-4 days. She also said that he had passed out at work on May 13, 2022 and went to ED, received IV fluids and felt better after.  His last treatment with nivolumab was on 12/15/17. Denies fever, chills, cough, nausea, vomiting or diarrhea.  His daughter states he is most likely not drinking enough fluids.  Encouraged her to have pt increase fluid intake. Also instructed her to call back by tomorrow am if he is not feeling better and we could see him in [facility]. She voiced understanding and is good with this plan.

## 2017-12-30 ENCOUNTER — Inpatient Hospital Stay: Payer: Medicare HMO

## 2017-12-30 ENCOUNTER — Inpatient Hospital Stay: Payer: Medicare HMO | Attending: Oncology

## 2017-12-30 VITALS — BP 112/65 | HR 80 | Temp 98.0°F | Resp 17 | Ht 69.0 in | Wt 198.5 lb

## 2017-12-30 DIAGNOSIS — C7800 Secondary malignant neoplasm of unspecified lung: Secondary | ICD-10-CM | POA: Diagnosis not present

## 2017-12-30 DIAGNOSIS — R21 Rash and other nonspecific skin eruption: Secondary | ICD-10-CM | POA: Insufficient documentation

## 2017-12-30 DIAGNOSIS — R55 Syncope and collapse: Secondary | ICD-10-CM | POA: Diagnosis not present

## 2017-12-30 DIAGNOSIS — C649 Malignant neoplasm of unspecified kidney, except renal pelvis: Secondary | ICD-10-CM

## 2017-12-30 DIAGNOSIS — R5382 Chronic fatigue, unspecified: Secondary | ICD-10-CM | POA: Insufficient documentation

## 2017-12-30 DIAGNOSIS — Z5112 Encounter for antineoplastic immunotherapy: Secondary | ICD-10-CM | POA: Insufficient documentation

## 2017-12-30 DIAGNOSIS — Z23 Encounter for immunization: Secondary | ICD-10-CM

## 2017-12-30 DIAGNOSIS — R630 Anorexia: Secondary | ICD-10-CM | POA: Insufficient documentation

## 2017-12-30 DIAGNOSIS — C787 Secondary malignant neoplasm of liver and intrahepatic bile duct: Secondary | ICD-10-CM | POA: Insufficient documentation

## 2017-12-30 DIAGNOSIS — C641 Malignant neoplasm of right kidney, except renal pelvis: Secondary | ICD-10-CM | POA: Diagnosis present

## 2017-12-30 DIAGNOSIS — C786 Secondary malignant neoplasm of retroperitoneum and peritoneum: Secondary | ICD-10-CM | POA: Insufficient documentation

## 2017-12-30 DIAGNOSIS — G4709 Other insomnia: Secondary | ICD-10-CM | POA: Insufficient documentation

## 2017-12-30 LAB — CBC WITH DIFFERENTIAL (CANCER CENTER ONLY)
ABS IMMATURE GRANULOCYTES: 0.01 10*3/uL (ref 0.00–0.07)
BASOS ABS: 0.1 10*3/uL (ref 0.0–0.1)
BASOS PCT: 1 %
EOS ABS: 0.7 10*3/uL — AB (ref 0.0–0.5)
Eosinophils Relative: 13 %
HCT: 41.9 % (ref 39.0–52.0)
Hemoglobin: 14.4 g/dL (ref 13.0–17.0)
IMMATURE GRANULOCYTES: 0 %
Lymphocytes Relative: 37 %
Lymphs Abs: 1.8 10*3/uL (ref 0.7–4.0)
MCH: 28.9 pg (ref 26.0–34.0)
MCHC: 34.4 g/dL (ref 30.0–36.0)
MCV: 84 fL (ref 80.0–100.0)
MONOS PCT: 16 %
Monocytes Absolute: 0.8 10*3/uL (ref 0.1–1.0)
NEUTROS PCT: 33 %
NRBC: 0 % (ref 0.0–0.2)
Neutro Abs: 1.7 10*3/uL (ref 1.7–7.7)
PLATELETS: 240 10*3/uL (ref 150–400)
RBC: 4.99 MIL/uL (ref 4.22–5.81)
RDW: 13.2 % (ref 11.5–15.5)
WBC Count: 5 10*3/uL (ref 4.0–10.5)

## 2017-12-30 LAB — CMP (CANCER CENTER ONLY)
ALBUMIN: 4 g/dL (ref 3.5–5.0)
ALT: 29 U/L (ref 0–44)
ANION GAP: 10 (ref 5–15)
AST: 32 U/L (ref 15–41)
Alkaline Phosphatase: 44 U/L (ref 38–126)
BILIRUBIN TOTAL: 0.5 mg/dL (ref 0.3–1.2)
BUN: 22 mg/dL (ref 8–23)
CALCIUM: 10 mg/dL (ref 8.9–10.3)
CO2: 27 mmol/L (ref 22–32)
Chloride: 103 mmol/L (ref 98–111)
Creatinine: 1.96 mg/dL — ABNORMAL HIGH (ref 0.61–1.24)
GFR, Est AFR Am: 37 mL/min — ABNORMAL LOW (ref >60)
GFR, Estimated: 32 mL/min — ABNORMAL LOW (ref >60)
GLUCOSE: 104 mg/dL — AB (ref 70–99)
Potassium: 4.5 mmol/L (ref 3.5–5.1)
SODIUM: 140 mmol/L (ref 135–145)
TOTAL PROTEIN: 7.5 g/dL (ref 6.5–8.1)

## 2017-12-30 MED ORDER — LORATADINE 10 MG PO TABS
10.0000 mg | ORAL_TABLET | Freq: Every day | ORAL | Status: DC
  Administered 2017-12-30: 10 mg via ORAL

## 2017-12-30 MED ORDER — SODIUM CHLORIDE 0.9 % IV SOLN
Freq: Once | INTRAVENOUS | Status: AC
  Administered 2017-12-30: 12:00:00 via INTRAVENOUS
  Filled 2017-12-30: qty 250

## 2017-12-30 MED ORDER — SODIUM CHLORIDE 0.9 % IV SOLN
240.0000 mg | Freq: Once | INTRAVENOUS | Status: AC
  Administered 2017-12-30: 240 mg via INTRAVENOUS
  Filled 2017-12-30: qty 24

## 2017-12-30 MED ORDER — SODIUM CHLORIDE 0.9 % IV SOLN
Freq: Once | INTRAVENOUS | Status: AC
  Administered 2017-12-30: 10:00:00 via INTRAVENOUS
  Filled 2017-12-30: qty 250

## 2017-12-30 MED ORDER — INFLUENZA VAC SPLIT HIGH-DOSE 0.5 ML IM SUSY
0.5000 mL | PREFILLED_SYRINGE | Freq: Once | INTRAMUSCULAR | Status: AC
  Administered 2017-12-30: 0.5 mL via INTRAMUSCULAR
  Filled 2017-12-30: qty 0.5

## 2017-12-30 MED ORDER — LORATADINE 10 MG PO TABS
ORAL_TABLET | ORAL | Status: AC
  Filled 2017-12-30: qty 1

## 2017-12-30 MED ORDER — INFLUENZA VAC SPLIT HIGH-DOSE 0.5 ML IM SUSY
0.5000 mL | PREFILLED_SYRINGE | INTRAMUSCULAR | Status: DC
  Filled 2017-12-30: qty 0.5

## 2017-12-30 NOTE — Patient Instructions (Addendum)
[facility] Discharge Instructions for Patients Receiving Chemotherapy  Today you received the following chemotherapy agents: Nivolumab (Opdivo)  To help prevent nausea and vomiting after your treatment, we encourage you to take your nausea medication as directed.    If you develop nausea and vomiting that is not controlled by your nausea medication, call the clinic.   BELOW ARE SYMPTOMS THAT SHOULD BE REPORTED IMMEDIATELY:  *FEVER GREATER THAN 100.5 F  *CHILLS WITH OR WITHOUT FEVER  NAUSEA AND VOMITING THAT IS NOT CONTROLLED WITH YOUR NAUSEA MEDICATION  *UNUSUAL SHORTNESS OF BREATH  *UNUSUAL BRUISING OR BLEEDING  TENDERNESS IN MOUTH AND THROAT WITH OR WITHOUT PRESENCE OF ULCERS  *URINARY PROBLEMS  *BOWEL PROBLEMS  UNUSUAL RASH Items with * indicate a potential emergency and should be followed up as soon as possible.  Feel free to call the clinic should you have any questions or concerns. The clinic phone number is [phone].  Please show the [facility] at check-in to the Emergency Department and triage nurse.  Influenza (Flu) Vaccine (Inactivated or Recombinant): What You Need to Know 1. Why get vaccinated? Influenza ("flu") is a contagious disease that spreads around the [place] every year, usually between October and May. Flu is caused by influenza viruses, and is spread mainly by coughing, sneezing, and close contact. Anyone can get flu. Flu strikes suddenly and can last several days. Symptoms vary by age, but can include:  fever/chills  sore throat  muscle aches  fatigue  cough  headache  runny or stuffy nose  Flu can also lead to pneumonia and blood infections, and cause diarrhea and seizures in children. If you have a medical condition, such as heart or lung disease, flu can make it worse. Flu is more dangerous for some people. Infants and young children, people 20 years of age and older, pregnant women, and people with  certain health conditions or a weakened immune system are at greatest risk. Each year thousands of people in the [place] die from flu, and many more are hospitalized. Flu vaccine can:  keep you from getting flu,  make flu less severe if you do get it, and  keep you from spreading flu to your family and other people. 2. Inactivated and recombinant flu vaccines A dose of flu vaccine is recommended every flu season. Children 6 months through 75 years of age may need two doses during the same flu season. Everyone else needs only one dose each flu season. Some inactivated flu vaccines contain a very small amount of a mercury-based preservative called thimerosal. Studies have not shown thimerosal in vaccines to be harmful, but flu vaccines that do not contain thimerosal are available. There is no live flu virus in flu shots. They cannot cause the flu. There are many flu viruses, and they are always changing. Each year a new flu vaccine is made to protect against three or four viruses that are likely to cause disease in the upcoming flu season. But even when the vaccine doesn't exactly match these viruses, it may still provide some protection. Flu vaccine cannot prevent:  flu that is caused by a virus not covered by the vaccine, or  illnesses that look like flu but are not.  It takes about 2 weeks for protection to develop after vaccination, and protection lasts through the flu season. 3. Some people should not get this vaccine Tell the person who is giving you the vaccine:  If you have any severe, life-threatening allergies.  If you ever had a life-threatening allergic reaction after a dose of flu vaccine, or have a severe allergy to any part of this vaccine, you may be advised not to get vaccinated. Most, but not all, types of flu vaccine contain a small amount of egg protein.  If you ever had Guillain-Barr Syndrome (also called GBS). Some people with a history of GBS should not get this  vaccine. This should be discussed with your doctor.  If you are not feeling well. It is usually okay to get flu vaccine when you have a mild illness, but you might be asked to come back when you feel better.  4. Risks of a vaccine reaction With any medicine, including vaccines, there is a chance of reactions. These are usually mild and go away on their own, but serious reactions are also possible. Most people who get a flu shot do not have any problems with it. Minor problems following a flu shot include:  soreness, redness, or swelling where the shot was given  hoarseness  sore, red or itchy eyes  cough  fever  aches  headache  itching  fatigue  If these problems occur, they usually begin soon after the shot and last 1 or 2 days. More serious problems following a flu shot can include the following:  There may be a small increased risk of Guillain-Barre Syndrome (GBS) after inactivated flu vaccine. This risk has been estimated at 1 or 2 additional cases per million people vaccinated. This is much lower than the risk of severe complications from flu, which can be prevented by flu vaccine.  Young children who get the flu shot along with pneumococcal vaccine (PCV13) and/or DTaP vaccine at the same time might be slightly more likely to have a seizure caused by fever. Ask your doctor for more information. Tell your doctor if a child who is getting flu vaccine has ever had a seizure.  Problems that could happen after any injected vaccine:  People sometimes faint after a medical procedure, including vaccination. Sitting or lying down for about 15 minutes can help prevent fainting, and injuries caused by a fall. Tell your doctor if you feel dizzy, or have vision changes or ringing in the ears.  Some people get severe pain in the shoulder and have difficulty moving the arm where a shot was given. This happens very rarely.  Any medication can cause a severe allergic reaction. Such  reactions from a vaccine are very rare, estimated at about 1 in a million doses, and would happen within a few minutes to a few hours after the vaccination. As with any medicine, there is a very remote chance of a vaccine causing a serious injury or death. The safety of vaccines is always being monitored. For more information, visit: http://www.aguilar.org/ 5. What if there is a serious reaction? What should I look for? Look for anything that concerns you, such as signs of a severe allergic reaction, very high fever, or unusual behavior. Signs of a severe allergic reaction can include hives, swelling of the face and throat, difficulty breathing, a fast heartbeat, dizziness, and weakness. These would start a few minutes to a few hours after the vaccination. What should I do?  If you think it is a severe allergic reaction or other emergency that can't wait, call 9-1-1 and get the person to the nearest hospital. Otherwise, call your doctor.  Reactions should be reported to the Vaccine Adverse Event Reporting System (VAERS). Your doctor should file this report,  or you can do it yourself through the VAERS web site at www.vaers.SamedayNews.es, or by calling (548)766-7996. ? VAERS does not give medical advice. 6. The National Vaccine Injury Compensation Program The [employer] Vaccine Injury Compensation Program (VICP) is a federal program that was created to compensate people who may have been injured by certain vaccines. Persons who believe they may have been injured by a vaccine can learn about the program and about filing a claim by calling 603-710-0451 or visiting the [facility] website at GoldCloset.com.ee. There is a time limit to file a claim for compensation. 7. How can I learn more?  Ask your healthcare provider. He or she can give you the vaccine package insert or suggest other sources of information.  Call your local or state health department.  Contact the Centers for Disease Control  and Prevention (CDC): ? Call 424-098-7987 (1-800-CDC-INFO) or ? Visit CDC's website at https://gibson.com/ Vaccine Information Statement, Inactivated Influenza Vaccine (09/22/2013) This information is not intended to replace advice given to you by your health care provider. Make sure you discuss any questions you have with your health care provider. Document Released: 11/27/2005 Document Revised: 10/24/2015 Document Reviewed: 10/24/2015 Elsevier Interactive Patient Education  2017 [employer].

## 2017-12-30 NOTE — Progress Notes (Signed)
Per Dr. Alen Blew: OK to treat with Creatinine of 1.96

## 2017-12-31 ENCOUNTER — Other Ambulatory Visit: Payer: Self-pay

## 2017-12-31 DIAGNOSIS — C649 Malignant neoplasm of unspecified kidney, except renal pelvis: Secondary | ICD-10-CM

## 2018-01-04 ENCOUNTER — Encounter (HOSPITAL_COMMUNITY): Payer: Medicare HMO

## 2018-01-05 ENCOUNTER — Telehealth: Payer: Self-pay | Admitting: *Deleted

## 2018-01-05 DIAGNOSIS — C649 Malignant neoplasm of unspecified kidney, except renal pelvis: Secondary | ICD-10-CM

## 2018-01-05 DIAGNOSIS — C641 Malignant neoplasm of right kidney, except renal pelvis: Secondary | ICD-10-CM

## 2018-01-05 NOTE — Telephone Encounter (Signed)
Returned call to [facility] daughter [name] [phone]) upon receipt of message "he called me.  Says he's weak.  When he rests he feels a little better.  Don't know if he is sick, dehydrated or what.  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  Went to ED, learned he was dehydrated and received IVF.    This week he's so weak he can hardly walk.  Able to do ADL's but unable to walk up apartment steps without stopping.  Has been short of breath about two weeks, coughs at times but only has one and a half lungs.  No fever.    He's not sleeping at all.  PCP prescribed something for sleep.  He stopped taking it saying it makes him feel worse during the day.    Drinks Pedialyte and maybe 12 oz water daily. Eats very little saying has no appetite.  Yesterday ate half an egg sandwich for breakfast, half a hot dog for lunch; half a pork chop with nothing more for dinner.  He's forcing himself to eat.  He took forever cutting the pork chop into very small pieces.  Has not complained or mentioned the itchy rash so it's is not as bad and does look a little better.  Bowel working well.  Bladder also except urine a little dark but when he drinks more it lightens.  He lives alone but we call, stop by when we can to check on him."   Verbal order received and read back from Dr. Alen Blew for [name] to be evaluated tomorrow in [facility] No tests or further orders until evaluated.'  Order given to [employer], [facility] and daughter [name] at this time.  Scheduling message sent; labs entered.

## 2018-01-06 ENCOUNTER — Inpatient Hospital Stay (HOSPITAL_BASED_OUTPATIENT_CLINIC_OR_DEPARTMENT_OTHER): Payer: Medicare HMO | Admitting: Medical

## 2018-01-06 ENCOUNTER — Inpatient Hospital Stay: Payer: Medicare HMO

## 2018-01-06 ENCOUNTER — Inpatient Hospital Stay: Payer: Medicare HMO | Admitting: Medical

## 2018-01-06 VITALS — BP 100/63 | HR 77 | Temp 97.8°F | Resp 17 | Ht 69.0 in | Wt 197.3 lb

## 2018-01-06 DIAGNOSIS — C787 Secondary malignant neoplasm of liver and intrahepatic bile duct: Secondary | ICD-10-CM

## 2018-01-06 DIAGNOSIS — R5382 Chronic fatigue, unspecified: Secondary | ICD-10-CM

## 2018-01-06 DIAGNOSIS — R55 Syncope and collapse: Secondary | ICD-10-CM | POA: Diagnosis not present

## 2018-01-06 DIAGNOSIS — G4709 Other insomnia: Secondary | ICD-10-CM

## 2018-01-06 DIAGNOSIS — R630 Anorexia: Secondary | ICD-10-CM | POA: Diagnosis not present

## 2018-01-06 DIAGNOSIS — C641 Malignant neoplasm of right kidney, except renal pelvis: Secondary | ICD-10-CM

## 2018-01-06 DIAGNOSIS — Z5112 Encounter for antineoplastic immunotherapy: Secondary | ICD-10-CM | POA: Diagnosis not present

## 2018-01-06 DIAGNOSIS — C786 Secondary malignant neoplasm of retroperitoneum and peritoneum: Secondary | ICD-10-CM | POA: Diagnosis not present

## 2018-01-06 DIAGNOSIS — R21 Rash and other nonspecific skin eruption: Secondary | ICD-10-CM | POA: Diagnosis not present

## 2018-01-06 DIAGNOSIS — C7800 Secondary malignant neoplasm of unspecified lung: Secondary | ICD-10-CM

## 2018-01-06 DIAGNOSIS — C649 Malignant neoplasm of unspecified kidney, except renal pelvis: Secondary | ICD-10-CM

## 2018-01-06 LAB — CBC WITH DIFFERENTIAL (CANCER CENTER ONLY)
ABS IMMATURE GRANULOCYTES: 0.03 10*3/uL (ref 0.00–0.07)
BASOS PCT: 1 %
Basophils Absolute: 0 10*3/uL (ref 0.0–0.1)
EOS PCT: 14 %
Eosinophils Absolute: 0.8 10*3/uL — ABNORMAL HIGH (ref 0.0–0.5)
HCT: 39.6 % (ref 39.0–52.0)
HEMOGLOBIN: 13.4 g/dL (ref 13.0–17.0)
Immature Granulocytes: 1 %
Lymphocytes Relative: 37 %
Lymphs Abs: 2.1 10*3/uL (ref 0.7–4.0)
MCH: 28.2 pg (ref 26.0–34.0)
MCHC: 33.8 g/dL (ref 30.0–36.0)
MCV: 83.4 fL (ref 80.0–100.0)
MONO ABS: 0.9 10*3/uL (ref 0.1–1.0)
Monocytes Relative: 16 %
NEUTROS ABS: 1.7 10*3/uL (ref 1.7–7.7)
Neutrophils Relative %: 31 %
Platelet Count: 244 10*3/uL (ref 150–400)
RBC: 4.75 MIL/uL (ref 4.22–5.81)
RDW: 13.3 % (ref 11.5–15.5)
WBC: 5.5 10*3/uL (ref 4.0–10.5)
nRBC: 0 % (ref 0.0–0.2)

## 2018-01-06 LAB — CMP (CANCER CENTER ONLY)
ALK PHOS: 39 U/L (ref 38–126)
ALT: 36 U/L (ref 0–44)
ANION GAP: 9 (ref 5–15)
AST: 41 U/L (ref 15–41)
Albumin: 3.9 g/dL (ref 3.5–5.0)
BILIRUBIN TOTAL: 0.5 mg/dL (ref 0.3–1.2)
BUN: 19 mg/dL (ref 8–23)
CALCIUM: 9.6 mg/dL (ref 8.9–10.3)
CO2: 28 mmol/L (ref 22–32)
Chloride: 100 mmol/L (ref 98–111)
Creatinine: 1.95 mg/dL — ABNORMAL HIGH (ref 0.61–1.24)
GFR, EST NON AFRICAN AMERICAN: 32 mL/min — AB (ref >60)
GFR, Est AFR Am: 37 mL/min — ABNORMAL LOW (ref >60)
Glucose, Bld: 90 mg/dL (ref 70–99)
Potassium: 4.5 mmol/L (ref 3.5–5.1)
Sodium: 137 mmol/L (ref 135–145)
TOTAL PROTEIN: 7.1 g/dL (ref 6.5–8.1)

## 2018-01-06 LAB — TSH: TSH: 0.312 u[IU]/mL — ABNORMAL LOW (ref 0.320–4.118)

## 2018-01-06 MED ORDER — MIRTAZAPINE 15 MG PO TABS: 15.0000 mg | ORAL_TABLET | Freq: Every day | ORAL | 2 refills | Status: DC

## 2018-01-07 ENCOUNTER — Telehealth: Payer: Self-pay | Admitting: Cardiology

## 2018-01-07 MED ORDER — AMLODIPINE BESYLATE 5 MG PO TABS: 5.0000 mg | ORAL_TABLET | Freq: Every day | ORAL | 0 refills | Status: DC

## 2018-01-07 NOTE — Telephone Encounter (Signed)
°  LAM for patient to return call, last visit was 12/04/16   1. Which medications need to be refilled? (please list name of each medication and dose if known) Amlodipine 5mg  tablet 1QD  2. Which pharmacy/location (including street and city if local pharmacy) is medication to be sent to?[facility]  3. Do they need a 30 day or 90 day supply? [place]

## 2018-01-07 NOTE — Telephone Encounter (Signed)
30 day supply of amlodipine was sent to [facility] with no refills. Patient is overdue for follow up and will have to schedule an appointment before further refills will be provided.

## 2018-01-07 NOTE — Progress Notes (Signed)
[facility] Progress Note   [name] [id number] 24-Sep-1945 72 y.o.  [name] is managed by Dr. Alen Blew  Actively treated with chemotherapy/immunotherapy: yes  Current Therapy: Opdivo  Last Treated: 12/30/2017 (cycle 5)  Assessment: Plan:    Other insomnia - Plan: mirtazapine (REMERON) 15 MG tablet, TSH  Chronic fatigue - Plan: TSH, MR Brain W Wo Contrast  Primary malignant neoplasm of kidney with metastasis from kidney to other site, unspecified laterality ([place]) - Plan: MR Brain W Wo Contrast  Syncope, unspecified syncope type - Plan: MR Brain W Wo Contrast   Insomnia with anorexia: The patient was instructed to discontinue trazodone and Lunesta and add Remeron 15 mg p.o. Nightly.  Chronic fatigue: A TSH was added to today's labs.  The patient will be referred for an MRI of the brain with and without contrast.  Syncope in a setting of a metastatic primary malignant neoplasm of the kidney: The patient is referred for an MRI of the brain with and without contrast.  He will be seen in follow-up by Dr. Alen Blew on 01/12/2018.  Please see After Visit Summary for patient specific instructions.  Future Appointments  Date Time Provider [place]  01/12/2018 10:00 AM [facility] LAB 5 [facility] None  01/12/2018 10:30 AM Wyatt Portela, MD [facility] None  01/12/2018 11:45 AM [facility] INFUSION [facility] None  01/26/2018  7:45 AM [facility] LAB 5 [facility] None  01/26/2018  8:30 AM [facility] INFUSION [facility] None  02/10/2018 10:00 AM [facility] LAB 6 [facility] None  02/10/2018 10:30 AM Alen Blew, Mathis Dad, MD [facility] None  02/10/2018 11:15 AM [facility] INFUSION [facility] None  03/11/2018  9:20 AM Munley, Hilton Cork, MD [facility] None    Orders Placed This Encounter  Procedures  . MR Brain W Wo Contrast  . TSH       Subjective:   Patient ID:  [name] is a 72 y.o. (DOB May 15, 1945) male.  Chief Complaint:  Chief Complaint    Patient presents with  . Fatigue  . Insomnia  . Leg Pain    HPI [name] is a 72 year old male with a diagnosis of a stage IV renal cell carcinoma with disease to the liver, lung and peritoneum which was diagnosed in December 2018.  He is status post cycle 5 of Opdivo which was dosed on 12/30/2017.  He presents to the clinic today with a report of fatigue weakness in his legs and continued insomnia.  He had previously been given a prescription for trazodone which he no longer takes.  He states that Lunesta causes him to have daytime drowsiness.  He was recently seen in the emergency room on 12/21/2017 for weakness a syncopal episode and possible seizures.  Our office received with the following message from the patient's daughter earlier today:  Returned call to [facility] daughter [name] [phone]) upon receipt of message "he called me.  Says he's weak.  When he rests he feels a little better.  Don't know if he is sick, dehydrated or what.  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  Went to ED, learned he was dehydrated and received IVF.    This week he's so weak he can hardly walk.  Able to do ADL's but unable to walk up apartment steps without stopping.  Has been short of breath about two weeks, coughs at times but only has one and a  half lungs.  No fever.    He's not sleeping at all.  PCP prescribed something for sleep.  He stopped taking it saying it makes him feel worse during the day.    Drinks Pedialyte and maybe 12 oz water daily. Eats very little saying has no appetite.  Yesterday ate half an egg sandwich for breakfast, half a hot dog for lunch; half a pork chop with nothing more for dinner.  He's forcing himself to eat.  He took forever cutting the pork chop into very small pieces.  Has not complained or mentioned the itchy rash so it's is not as bad and does  look a little better.  Bowel working well.  Bladder also except urine a little dark but when he drinks more it lightens.  He lives alone but we call, stop by when we can to check on him."   Medications: I have reviewed the patient's current medications.  Allergies:  Allergies  Allergen Reactions  . Benadryl [Diphenhydramine] Other (See Comments)    Severe restless legs    Past Medical History:  Diagnosis Date  . Arthritis   . Chronic kidney disease    only has one kidney   . 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 02/01/2015  . Coronary artery disease    followed by Dr.Tilley  . GERD (gastroesophageal reflux disease)   . Heart murmur   . High cholesterol   . Hx of cancer of lung 1980's  . Hypertension   . Incisional hernia 08/01/2015  . Lung cancer ([place])   . Recurrent umbilical hernia 9/32/6712  . Right renal mass     Past Surgical History:  Procedure Laterality Date  . APPENDECTOMY  age 70  . CHOLECYSTECTOMY  1980's  . LAPAROSCOPIC LYSIS OF ADHESIONS N/A 08/01/2015   Procedure: LAPAROSCOPIC LYSIS OF ADHESIONS;  Surgeon: Michael Boston, MD;  Location: [facility];  Service: General;  Laterality: N/A;  . LUNG LOBECTOMY  1992   lung cancer- patient has staples in lung not to have MRI per patient   . PILONIDAL CYST EXCISION  2011   Dr Harlow Asa  . ROBOTIC ASSITED PARTIAL NEPHRECTOMY Right 02/01/2015   Procedure: RIGHT ROBOTIC ASSISTED LAPAROSOCOPY NEPHRECTOMY;  Surgeon: Ardis Hughs, MD;  Location: [facility];  Service: Urology;  Laterality: Right;  . VENTRAL HERNIA REPAIR N/A 08/01/2015   Procedure: LAPAROSCOPIC VENTRAL WALL HERNIA WITH MESH;  Surgeon: Michael Boston, MD;  Location: [facility];  Service: General;  Laterality: N/A;    No family history on file.  Social History   Socioeconomic History  . Marital status: Married    Spouse name: Not on file  . Number of children: Not on file  . Years of education: Not on file  . Highest education level:  Not on file  Occupational History  . Not on file  Social Needs  . Financial resource strain: Not on file  . Food insecurity:    Worry: Not on file    Inability: Not on file  . Transportation needs:    Medical: Not on file    Non-medical: Not on file  Tobacco Use  . Smoking status: Former Smoker    Last attempt to quit: 01/29/1988    Years since quitting: 29.9  . Smokeless tobacco: Never Used  Substance and Sexual Activity  . Alcohol use: Yes    Comment: rare  . Drug use: No  . Sexual activity: Not on file  Lifestyle  . Physical activity:  Days per week: Not on file    Minutes per session: Not on file  . Stress: Not on file  Relationships  . Social connections:    Talks on phone: Not on file    Gets together: Not on file    Attends religious service: Not on file    Active member of club or organization: Not on file    Attends meetings of clubs or organizations: Not on file    Relationship status: Not on file  . Intimate partner violence:    Fear of current or ex partner: Not on file    Emotionally abused: Not on file    Physically abused: Not on file    Forced sexual activity: Not on file  Other Topics Concern  . Not on file  Social History Narrative  . Not on file    Past Medical History, Surgical history, Social history, and Family history were reviewed and updated as appropriate.   Please see review of systems for further details on the patient's review from today.   Review of Systems:  Review of Systems  Constitutional: Positive for appetite change and fatigue. Negative for chills, diaphoresis and fever.  HENT: Negative for trouble swallowing and voice change.   Respiratory: Negative for cough, chest tightness, shortness of breath and wheezing.   Cardiovascular: Negative for chest pain and palpitations.  Gastrointestinal: Negative for abdominal pain, constipation, diarrhea, nausea and vomiting.  Musculoskeletal: Negative for back pain and myalgias.    Neurological: Positive for syncope and weakness. Negative for dizziness, light-headedness and headaches.    Objective:   Physical Exam:  BP 100/63 (BP Location: Left Arm, Patient Position: Sitting)   Pulse 77   Temp 97.8 F (36.6 C) (Oral)   Resp 17   Ht 5\' 9"  (1.753 m)   Wt 197 lb 4.8 oz (89.5 kg)   SpO2 97%   BMI 29.14 kg/m  ECOG: 0  Physical Exam  Constitutional: No distress.  HENT:  Head: Normocephalic and atraumatic.  Cardiovascular: Normal rate, regular rhythm and normal heart sounds. Exam reveals no gallop and no friction rub.  No murmur heard. Pulmonary/Chest: Effort normal and breath sounds normal. No respiratory distress. He has no wheezes. He has no rales.  Neurological: He is alert.  Skin: Skin is warm and dry. No rash noted. He is not diaphoretic. No erythema.    Lab Review:     Component Value Date/Time   NA 137 01/06/2018 1133   K 4.5 01/06/2018 1133   CL 100 01/06/2018 1133   CO2 28 01/06/2018 1133   GLUCOSE 90 01/06/2018 1133   BUN 19 01/06/2018 1133   CREATININE 1.95 (H) 01/06/2018 1133   CALCIUM 9.6 01/06/2018 1133   PROT 7.1 01/06/2018 1133   ALBUMIN 3.9 01/06/2018 1133   AST 41 01/06/2018 1133   ALT 36 01/06/2018 1133   ALKPHOS 39 01/06/2018 1133   BILITOT 0.5 01/06/2018 1133   GFRNONAA 32 (L) 01/06/2018 1133   GFRAA 37 (L) 01/06/2018 1133       Component Value Date/Time   WBC 5.5 01/06/2018 1133   WBC 6.8 09/07/2017 0702   RBC 4.75 01/06/2018 1133   HGB 13.4 01/06/2018 1133   HCT 39.6 01/06/2018 1133   PLT 244 01/06/2018 1133   MCV 83.4 01/06/2018 1133   MCH 28.2 01/06/2018 1133   MCHC 33.8 01/06/2018 1133   RDW 13.3 01/06/2018 1133   LYMPHSABS 2.1 01/06/2018 1133   MONOABS 0.9 01/06/2018 1133   EOSABS  0.8 (H) 01/06/2018 1133   BASOSABS 0.0 01/06/2018 1133   -------------------------------  Imaging from last 24 hours (if applicable):  Radiology interpretation: Ct Abdomen Pelvis W Wo Contrast  Result Date:  12/14/2017 CLINICAL DATA:  Right lung cancer status post resection. Metastatic renal cancer status post nephrectomy with lung metastases. EXAM: CT CHEST WITH CONTRAST CT ABDOMEN AND PELVIS WITHOUT AND WITH CONTRAST TECHNIQUE: Multidetector CT imaging of the abdomen and pelvis was performed following the standard protocol before and during bolus administration of intravenous contrast. Multidetector CT imaging of the chest was performed following the standard protocol during bolus administration of intravenous contrast. CONTRAST:  72mL OMNIPAQUE IOHEXOL 300 MG/ML  SOLN COMPARISON:  CT chest dated 09/09/2017. CT abdomen/pelvis dated 08/03/2017. FINDINGS: CT CHEST FINDINGS Cardiovascular: Heart is normal in size.  No pericardial effusion. No evidence of thoracic aortic aneurysm. Ectasia of the ascending thoracic aorta, measuring 3.7 cm, unchanged. Atherosclerotic calcifications of the aortic arch. Three vessel coronary atherosclerosis. Mediastinum/Nodes: No suspicious mediastinal lymphadenopathy. Visualized thyroid is unremarkable. Lungs/Pleura: Status post right lower lobectomy. Residual 8 mm central left lower lobe nodule/metastasis (series 15/image 78), previously 1.6 x 1.8 cm. Prior 6 mm left upper lobe nodule has resolved. No focal consolidation. No pleural effusion or pneumothorax. Musculoskeletal: Mild degenerative changes of the mid thoracic spine. CT ABDOMEN PELVIS FINDINGS Hepatobiliary: Chronic 4.9 x 3.9 cm liver lesion in segment 4A (series 11/image 37), unchanged from 2017, benign Residual 13 mm hypoenhancing lesion along the posterior aspect of segment 6 adjacent to the hepatic renal fossa (series 16/image 9), corresponding to the prior 3.1 x 3.2 cm heterogeneous enhancing lesion in this region, favoring improving metastasis. Status post cholecystectomy. No intrahepatic or extrahepatic ductal dilatation. Pancreas: Within normal limits. Spleen: Within limits. Adrenals/Urinary Tract: Adrenal glands within  normal limits. Status post right nephrectomy with postsurgical changes/fat packing in the surgical bed. 7 mm interpolar left renal cyst (series 16/image 39). No enhancing renal lesions. No hydronephrosis. Bladder is mildly thick-walled although underdistended. Stomach/Bowel: Stomach is within normal limits. No evidence of bowel obstruction. Appendix is not discretely visualized. Extensive colonic diverticulosis, without evidence of diverticulitis. Vascular/Lymphatic: No evidence of abdominal aortic aneurysm. Atherosclerotic calcifications of the abdominal aorta and branch vessels. 14 mm short axis portacaval node (series 16/image 58), unchanged, at the upper limits of normal. 11 mm short axis retrocaval node (series 16/image 62), previously 2.9 cm. Reproductive: Prostate is grossly unremarkable. Other: No abdominopelvic ascites. 1.2 x 1.9 cm peritoneal implant beneath the left mid abdominal wall (series 16/image 36), previously 2.7 x 4.2 cm. Musculoskeletal: Mild degenerative changes at L5-S1. IMPRESSION: Status post right nephrectomy for renal cell cancer. Status post right lower lobectomy for lung cancer. Improving left lung metastases, with a residual 8 mm nodule in the left lower lobe. Improving hepatic metastasis along the posterior aspect of segment 6, now measuring 1.3 cm. Improving upper abdominal nodal metastases, including an 11 mm short axis retrocaval node. Improving peritoneal implant beneath the left mid abdominal wall, now measuring 1.2 x 1.9 cm. Electronically Signed   By: Julian Hy M.D.   On: 12/14/2017 12:46   Ct Chest W Contrast  Result Date: 12/14/2017 CLINICAL DATA:  Right lung cancer status post resection. Metastatic renal cancer status post nephrectomy with lung metastases. EXAM: CT CHEST WITH CONTRAST CT ABDOMEN AND PELVIS WITHOUT AND WITH CONTRAST TECHNIQUE: Multidetector CT imaging of the abdomen and pelvis was performed following the standard protocol before and during bolus  administration of intravenous contrast. Multidetector CT imaging of the  chest was performed following the standard protocol during bolus administration of intravenous contrast. CONTRAST:  17mL OMNIPAQUE IOHEXOL 300 MG/ML  SOLN COMPARISON:  CT chest dated 09/09/2017. CT abdomen/pelvis dated 08/03/2017. FINDINGS: CT CHEST FINDINGS Cardiovascular: Heart is normal in size.  No pericardial effusion. No evidence of thoracic aortic aneurysm. Ectasia of the ascending thoracic aorta, measuring 3.7 cm, unchanged. Atherosclerotic calcifications of the aortic arch. Three vessel coronary atherosclerosis. Mediastinum/Nodes: No suspicious mediastinal lymphadenopathy. Visualized thyroid is unremarkable. Lungs/Pleura: Status post right lower lobectomy. Residual 8 mm central left lower lobe nodule/metastasis (series 15/image 78), previously 1.6 x 1.8 cm. Prior 6 mm left upper lobe nodule has resolved. No focal consolidation. No pleural effusion or pneumothorax. Musculoskeletal: Mild degenerative changes of the mid thoracic spine. CT ABDOMEN PELVIS FINDINGS Hepatobiliary: Chronic 4.9 x 3.9 cm liver lesion in segment 4A (series 11/image 37), unchanged from 2017, benign Residual 13 mm hypoenhancing lesion along the posterior aspect of segment 6 adjacent to the hepatic renal fossa (series 16/image 9), corresponding to the prior 3.1 x 3.2 cm heterogeneous enhancing lesion in this region, favoring improving metastasis. Status post cholecystectomy. No intrahepatic or extrahepatic ductal dilatation. Pancreas: Within normal limits. Spleen: Within limits. Adrenals/Urinary Tract: Adrenal glands within normal limits. Status post right nephrectomy with postsurgical changes/fat packing in the surgical bed. 7 mm interpolar left renal cyst (series 16/image 39). No enhancing renal lesions. No hydronephrosis. Bladder is mildly thick-walled although underdistended. Stomach/Bowel: Stomach is within normal limits. No evidence of bowel obstruction.  Appendix is not discretely visualized. Extensive colonic diverticulosis, without evidence of diverticulitis. Vascular/Lymphatic: No evidence of abdominal aortic aneurysm. Atherosclerotic calcifications of the abdominal aorta and branch vessels. 14 mm short axis portacaval node (series 16/image 58), unchanged, at the upper limits of normal. 11 mm short axis retrocaval node (series 16/image 62), previously 2.9 cm. Reproductive: Prostate is grossly unremarkable. Other: No abdominopelvic ascites. 1.2 x 1.9 cm peritoneal implant beneath the left mid abdominal wall (series 16/image 36), previously 2.7 x 4.2 cm. Musculoskeletal: Mild degenerative changes at L5-S1. IMPRESSION: Status post right nephrectomy for renal cell cancer. Status post right lower lobectomy for lung cancer. Improving left lung metastases, with a residual 8 mm nodule in the left lower lobe. Improving hepatic metastasis along the posterior aspect of segment 6, now measuring 1.3 cm. Improving upper abdominal nodal metastases, including an 11 mm short axis retrocaval node. Improving peritoneal implant beneath the left mid abdominal wall, now measuring 1.2 x 1.9 cm. Electronically Signed   By: Julian Hy M.D.   On: 12/14/2017 12:46        This patient was seen with Dr. Alen Blew with my treatment plan reviewed with him. He expressed agreement with my medical management of this patient.

## 2018-01-07 NOTE — Telephone Encounter (Signed)
appt 03/11/2018

## 2018-01-07 NOTE — Progress Notes (Signed)
These preliminary result these preliminary results were noted.  Awaiting final report.

## 2018-01-10 ENCOUNTER — Ambulatory Visit (HOSPITAL_COMMUNITY)
Admission: RE | Admit: 2018-01-10 | Discharge: 2018-01-10 | Disposition: A | Discharge status: Home / Self Care | Payer: Medicare HMO | Source: Ambulatory Visit | Attending: Medical | Admitting: Medical

## 2018-01-10 NOTE — Progress Notes (Signed)
Patient was a no call no show for the MRI scan scheduled 11/25 bhj

## 2018-01-12 ENCOUNTER — Inpatient Hospital Stay: Payer: Medicare HMO

## 2018-01-12 ENCOUNTER — Inpatient Hospital Stay (HOSPITAL_BASED_OUTPATIENT_CLINIC_OR_DEPARTMENT_OTHER): Payer: Medicare HMO | Admitting: Oncology

## 2018-01-12 VITALS — BP 108/67 | HR 78 | Temp 98.0°F | Resp 18 | Ht 69.0 in | Wt 198.0 lb

## 2018-01-12 DIAGNOSIS — C649 Malignant neoplasm of unspecified kidney, except renal pelvis: Secondary | ICD-10-CM

## 2018-01-12 DIAGNOSIS — C7800 Secondary malignant neoplasm of unspecified lung: Secondary | ICD-10-CM | POA: Diagnosis not present

## 2018-01-12 DIAGNOSIS — C786 Secondary malignant neoplasm of retroperitoneum and peritoneum: Secondary | ICD-10-CM | POA: Diagnosis not present

## 2018-01-12 DIAGNOSIS — Z905 Acquired absence of kidney: Secondary | ICD-10-CM

## 2018-01-12 DIAGNOSIS — C787 Secondary malignant neoplasm of liver and intrahepatic bile duct: Secondary | ICD-10-CM | POA: Diagnosis not present

## 2018-01-12 DIAGNOSIS — C641 Malignant neoplasm of right kidney, except renal pelvis: Secondary | ICD-10-CM

## 2018-01-12 DIAGNOSIS — Z5112 Encounter for antineoplastic immunotherapy: Secondary | ICD-10-CM | POA: Diagnosis not present

## 2018-01-12 LAB — CMP (CANCER CENTER ONLY)
ALT: 19 U/L (ref 0–44)
AST: 23 U/L (ref 15–41)
Albumin: 3.8 g/dL (ref 3.5–5.0)
Alkaline Phosphatase: 37 U/L — ABNORMAL LOW (ref 38–126)
Anion gap: 9 (ref 5–15)
BUN: 17 mg/dL (ref 8–23)
CHLORIDE: 103 mmol/L (ref 98–111)
CO2: 27 mmol/L (ref 22–32)
Calcium: 9.5 mg/dL (ref 8.9–10.3)
Creatinine: 1.81 mg/dL — ABNORMAL HIGH (ref 0.61–1.24)
GFR, EST AFRICAN AMERICAN: 42 mL/min — AB (ref >60)
GFR, Estimated: 36 mL/min — ABNORMAL LOW (ref >60)
Glucose, Bld: 122 mg/dL — ABNORMAL HIGH (ref 70–99)
POTASSIUM: 3.9 mmol/L (ref 3.5–5.1)
SODIUM: 139 mmol/L (ref 135–145)
TOTAL PROTEIN: 6.8 g/dL (ref 6.5–8.1)
Total Bilirubin: 0.5 mg/dL (ref 0.3–1.2)

## 2018-01-12 LAB — CBC WITH DIFFERENTIAL (CANCER CENTER ONLY)
Abs Immature Granulocytes: 0.02 10*3/uL (ref 0.00–0.07)
Basophils Absolute: 0 10*3/uL (ref 0.0–0.1)
Basophils Relative: 1 %
EOS ABS: 0.7 10*3/uL — AB (ref 0.0–0.5)
Eosinophils Relative: 14 %
HEMATOCRIT: 39.9 % (ref 39.0–52.0)
Hemoglobin: 13.2 g/dL (ref 13.0–17.0)
Immature Granulocytes: 0 %
LYMPHS ABS: 2 10*3/uL (ref 0.7–4.0)
Lymphocytes Relative: 40 %
MCH: 28 pg (ref 26.0–34.0)
MCHC: 33.1 g/dL (ref 30.0–36.0)
MCV: 84.7 fL (ref 80.0–100.0)
MONO ABS: 0.6 10*3/uL (ref 0.1–1.0)
MONOS PCT: 13 %
NEUTROS PCT: 32 %
Neutro Abs: 1.6 10*3/uL — ABNORMAL LOW (ref 1.7–7.7)
Platelet Count: 229 10*3/uL (ref 150–400)
RBC: 4.71 MIL/uL (ref 4.22–5.81)
RDW: 13.6 % (ref 11.5–15.5)
WBC Count: 4.9 10*3/uL (ref 4.0–10.5)
nRBC: 0 % (ref 0.0–0.2)

## 2018-01-12 NOTE — Progress Notes (Signed)
Hematology and Oncology Follow Up Visit  [name] [id number] June 16, 1945 72 y.o. 01/12/2018 10:21 AM [name], MDElkins, [name], *   Principle Diagnosis: 72 year old man with renal cell carcinoma diagnosed in 2016.  He subsequently developed stage IV disease with metastasis to the liver, lung and peritoneum diagnosed in December 2018.    Prior Therapy: He underwent a radical nephrectomy completed by Dr. Louis Meckel on February 01, 2015.  The final pathology revealed a 3.3 cm clear cell renal cell carcinoma with Fuhrman grade 3.  Margins were negative indicating stage T1 a disease  Nivolumab at 3 mg/mg and ipilimumab at 1 mg/kg cycle 1 started on 09/21/2017.  He completed 4 cycles of therapy.     Current therapy: Nivolumab maintenance therapy every 2 weeks started on December 15, 2017.  Interim History: [name] returns today for repeat evaluation.  Since the last visit, he has developed a vague complaints of fatigue, insomnia and overall lethargy.  He was evaluated in the emergency department and CT scan of the brain was unremarkable.  He did require intravenous hydration on few occasions with improvement in his symptoms.  The symptoms appears to have improved slightly although he still has rather vague complaints of insomnia and decrease in appetite.  His weight has not really changed dramatically and denies any neurological deficits.  He denies any worsening skin rash.  He does not report any headaches, blurry vision, syncope or seizures.  He denies any alteration in mental status.  Does not report any fevers, chills or sweats.  Does not report any cough, wheezing or hemoptysis.  Does not report any chest pain, palpitation, orthopnea or leg edema.  Does not report any nausea, vomiting or abdominal pain.  Does not report any change in bowel habits.  Does not report any arthralgias or myalgias.   Does not report frequency, urgency or hematuria.  Does not report any bleeding or  clotting tendency.  Does not report any ecchymosis or petechiae.  Denies any mood changes.  Remaining review of systems is negative.    Medications: I have reviewed the patient's current medications.  Current Outpatient Medications  Medication Sig Dispense Refill  . amLODipine (NORVASC) 5 MG tablet Take 1 tablet (5 mg total) by mouth daily. 30 tablet 0  . aspirin 81 MG chewable tablet Chew 81 mg by mouth daily.    Marland Kitchen ibuprofen ([employer] IBUPROFEN) 200 MG tablet Take 200 mg by mouth every 8 (eight) hours as needed.    Marland Kitchen lisinopril-hydrochlorothiazide (PRINZIDE,ZESTORETIC) 20-25 MG tablet Take 1 tablet by mouth daily.    . mirtazapine (REMERON) 15 MG tablet Take 1 tablet (15 mg total) by mouth at bedtime. 30 tablet 2  . prochlorperazine (COMPAZINE) 10 MG tablet Take 1 tablet (10 mg total) by mouth every 6 (six) hours as needed for nausea or vomiting. 30 tablet 0  . ranitidine (ZANTAC) 150 MG capsule Take 150 mg by mouth daily.     No current facility-administered medications for this visit.      Allergies:  Allergies  Allergen Reactions  . Benadryl [Diphenhydramine] Other (See Comments)    Severe restless legs    Past Medical History, Surgical history, Social history, and Family History were reviewed and updated.    Physical Exam:   Blood pressure 108/67, pulse 78, temperature 98 F (36.7 C), temperature source Oral, resp. rate 18, height 5\' 9"  (1.753 m), weight 198 lb (89.8 kg), SpO2 97 %.     ECOG: 0  General appearance: Comfortable appearing without any discomfort Head: Normocephalic without any trauma Oropharynx: Mucous membranes are moist and pink without any thrush or ulcers. Eyes: Pupils are equal and round reactive to light. Lymph nodes: No cervical, supraclavicular, inguinal or axillary lymphadenopathy.   Heart:regular rate and rhythm.  S1 and S2 without leg edema. Lung: Clear without any rhonchi or wheezes.  No dullness to percussion. Abdomin: Soft, nontender,  nondistended with good bowel sounds.  No hepatosplenomegaly. Musculoskeletal: No joint deformity or effusion.  Full range of motion noted. Neurological: No deficits noted on motor, sensory and deep tendon reflex exam. Skin: Mild erythema noted on his chest wall and lower extremities.             Lab Results: Lab Results  Component Value Date   WBC 4.9 01/12/2018   HGB 13.2 01/12/2018   HCT 39.9 01/12/2018   MCV 84.7 01/12/2018   PLT 229 01/12/2018     Chemistry      Component Value Date/Time   NA 137 01/06/2018 1133   K 4.5 01/06/2018 1133   CL 100 01/06/2018 1133   CO2 28 01/06/2018 1133   BUN 19 01/06/2018 1133   CREATININE 1.95 (H) 01/06/2018 1133      Component Value Date/Time   CALCIUM 9.6 01/06/2018 1133   ALKPHOS 39 01/06/2018 1133   AST 41 01/06/2018 1133   ALT 36 01/06/2018 1133   BILITOT 0.5 01/06/2018 1133     Impression and Plan:  72 year old man with:  1.  Stage IV renal cell carcinoma with disease to the lung, peritoneum and liver noted in July 2019.  He has completed 4 cycles of combined immunotherapy with excellent response by radiographic criteria based on CT scans on December 14, 2017.  He is currently on maintenance nivolumab which she has received at on 2 separate occasions.  Risks and benefits of continuing this therapy for the time being was discussed versus a treatment break given the toxicities he is accumulating.  After discussion today, we will hold treatment for the time being and will resume nivolumab in 2 weeks and attempt to give him a small break.  2.  Immune mediated complications: No new complaints reported at this time.  I continue to educate him about long-term complications such as pneumonitis, hepatitis and thyroid disease.  3.  IV access: Peripheral veins remain in use at this time.  4.  Dermatological toxicity: Improved at this time.  5.  Prognosis and goals of care: Therapy remains palliative at this time although his  disease is under excellent control and performance status is adequate.  6.  Follow-up: We will be every 2 weeks with nivolumab with MD follow-up in 4 weeks.  25  minutes was spent with the patient face-to-face today.  More than 50% of time was dedicated to reviewing his disease status, treatment options and dealing with complications related to therapy.   Zola Button, MD 11/27/201910:21 AM

## 2018-01-12 NOTE — Progress Notes (Signed)
These preliminary result these preliminary results were noted.  Awaiting final report.

## 2018-01-19 ENCOUNTER — Telehealth: Payer: Self-pay | Admitting: *Deleted

## 2018-01-19 ENCOUNTER — Ambulatory Visit (HOSPITAL_COMMUNITY)
Admission: RE | Admit: 2018-01-19 | Discharge: 2018-01-19 | Disposition: A | Discharge status: Home / Self Care | Payer: Medicare HMO | Source: Ambulatory Visit | Attending: Medical | Admitting: Medical

## 2018-01-19 DIAGNOSIS — C649 Malignant neoplasm of unspecified kidney, except renal pelvis: Secondary | ICD-10-CM | POA: Insufficient documentation

## 2018-01-19 DIAGNOSIS — R55 Syncope and collapse: Secondary | ICD-10-CM | POA: Insufficient documentation

## 2018-01-19 DIAGNOSIS — R5382 Chronic fatigue, unspecified: Secondary | ICD-10-CM | POA: Insufficient documentation

## 2018-01-19 NOTE — Progress Notes (Signed)
Patient arrived for MRI Brain scan. Patient stated he was told not to ever have a MRI when he had his Lung surgery in 1990's.He stated he tried a MRI years before and he felt heating then and had to be taken out. We discussed with the patient that, Dr Jeralyn Ruths , Radiologist,agreed we could put him into the scanner as he is Alert and Oriented  and if he felt any heating at all we would remove him Immed. The patient did not want to try and  refused exam.  bhj

## 2018-01-19 NOTE — Telephone Encounter (Signed)
Willeen Niece with MRI [phone]) calling to notify provider Sanjuana Mae refused today's MRI.  Reports instructed he is not allowed to have MRI's due to lung surgery in early 90's.  Instructed to alert MRI staff if he felt any heat but he refused MRI therefore we cancelled orders."

## 2018-01-26 ENCOUNTER — Inpatient Hospital Stay: Payer: Medicare HMO

## 2018-01-26 ENCOUNTER — Inpatient Hospital Stay: Payer: Medicare HMO | Attending: Oncology

## 2018-01-26 DIAGNOSIS — C641 Malignant neoplasm of right kidney, except renal pelvis: Secondary | ICD-10-CM | POA: Insufficient documentation

## 2018-01-26 DIAGNOSIS — C7802 Secondary malignant neoplasm of left lung: Secondary | ICD-10-CM | POA: Diagnosis not present

## 2018-01-26 DIAGNOSIS — C786 Secondary malignant neoplasm of retroperitoneum and peritoneum: Secondary | ICD-10-CM | POA: Insufficient documentation

## 2018-01-26 DIAGNOSIS — Z79899 Other long term (current) drug therapy: Secondary | ICD-10-CM | POA: Diagnosis not present

## 2018-01-26 DIAGNOSIS — Z905 Acquired absence of kidney: Secondary | ICD-10-CM | POA: Diagnosis not present

## 2018-01-26 DIAGNOSIS — Z7982 Long term (current) use of aspirin: Secondary | ICD-10-CM | POA: Insufficient documentation

## 2018-01-26 DIAGNOSIS — C772 Secondary and unspecified malignant neoplasm of intra-abdominal lymph nodes: Secondary | ICD-10-CM | POA: Diagnosis not present

## 2018-01-26 DIAGNOSIS — C787 Secondary malignant neoplasm of liver and intrahepatic bile duct: Secondary | ICD-10-CM | POA: Diagnosis not present

## 2018-01-26 DIAGNOSIS — Z902 Acquired absence of lung [part of]: Secondary | ICD-10-CM | POA: Insufficient documentation

## 2018-01-26 DIAGNOSIS — C649 Malignant neoplasm of unspecified kidney, except renal pelvis: Secondary | ICD-10-CM

## 2018-01-26 LAB — CMP (CANCER CENTER ONLY)
ALT: 16 U/L (ref 0–44)
AST: 25 U/L (ref 15–41)
Albumin: 4 g/dL (ref 3.5–5.0)
Alkaline Phosphatase: 38 U/L (ref 38–126)
Anion gap: 11 (ref 5–15)
BUN: 14 mg/dL (ref 8–23)
CO2: 26 mmol/L (ref 22–32)
Calcium: 9.7 mg/dL (ref 8.9–10.3)
Chloride: 104 mmol/L (ref 98–111)
Creatinine: 1.86 mg/dL — ABNORMAL HIGH (ref 0.61–1.24)
GFR, EST AFRICAN AMERICAN: 40 mL/min — AB (ref >60)
GFR, Estimated: 35 mL/min — ABNORMAL LOW (ref >60)
Glucose, Bld: 89 mg/dL (ref 70–99)
Potassium: 4 mmol/L (ref 3.5–5.1)
Sodium: 141 mmol/L (ref 135–145)
Total Bilirubin: 0.7 mg/dL (ref 0.3–1.2)
Total Protein: 7.2 g/dL (ref 6.5–8.1)

## 2018-01-26 LAB — CBC WITH DIFFERENTIAL (CANCER CENTER ONLY)
Abs Immature Granulocytes: 0.03 10*3/uL (ref 0.00–0.07)
Basophils Absolute: 0 10*3/uL (ref 0.0–0.1)
Basophils Relative: 1 %
Eosinophils Absolute: 0.6 10*3/uL — ABNORMAL HIGH (ref 0.0–0.5)
Eosinophils Relative: 12 %
HEMATOCRIT: 40.7 % (ref 39.0–52.0)
Hemoglobin: 13.6 g/dL (ref 13.0–17.0)
Immature Granulocytes: 1 %
Lymphocytes Relative: 40 %
Lymphs Abs: 2.2 10*3/uL (ref 0.7–4.0)
MCH: 28.3 pg (ref 26.0–34.0)
MCHC: 33.4 g/dL (ref 30.0–36.0)
MCV: 84.8 fL (ref 80.0–100.0)
MONO ABS: 0.7 10*3/uL (ref 0.1–1.0)
Monocytes Relative: 13 %
Neutro Abs: 1.7 10*3/uL (ref 1.7–7.7)
Neutrophils Relative %: 33 %
Platelet Count: 238 10*3/uL (ref 150–400)
RBC: 4.8 MIL/uL (ref 4.22–5.81)
RDW: 13.8 % (ref 11.5–15.5)
WBC Count: 5.2 10*3/uL (ref 4.0–10.5)
nRBC: 0 % (ref 0.0–0.2)

## 2018-01-26 NOTE — Progress Notes (Signed)
Patient presented to treatment area c/o increased weakness, arthralgia, and skin rash to right anterior chest. Also c/o decreased appetite. Dr. Alen Blew aware of complaints. Dr. Alen Blew came to treatment area to assess patient and advised to hold treatment today. Patient will f/u with Dr. Alen Blew on 02/10/2018. Advised to call if symptoms worsen. Verbalized understanding.

## 2018-02-10 ENCOUNTER — Telehealth: Payer: Self-pay | Admitting: Oncology

## 2018-02-10 ENCOUNTER — Inpatient Hospital Stay: Payer: Medicare HMO

## 2018-02-10 ENCOUNTER — Inpatient Hospital Stay (HOSPITAL_BASED_OUTPATIENT_CLINIC_OR_DEPARTMENT_OTHER): Payer: Medicare HMO | Admitting: Oncology

## 2018-02-10 VITALS — BP 99/70 | HR 74 | Temp 97.7°F | Resp 16 | Ht 69.0 in | Wt 186.3 lb

## 2018-02-10 DIAGNOSIS — C641 Malignant neoplasm of right kidney, except renal pelvis: Secondary | ICD-10-CM

## 2018-02-10 DIAGNOSIS — C772 Secondary and unspecified malignant neoplasm of intra-abdominal lymph nodes: Secondary | ICD-10-CM | POA: Diagnosis not present

## 2018-02-10 DIAGNOSIS — Z905 Acquired absence of kidney: Secondary | ICD-10-CM | POA: Diagnosis not present

## 2018-02-10 DIAGNOSIS — C787 Secondary malignant neoplasm of liver and intrahepatic bile duct: Secondary | ICD-10-CM

## 2018-02-10 DIAGNOSIS — C7802 Secondary malignant neoplasm of left lung: Secondary | ICD-10-CM | POA: Diagnosis not present

## 2018-02-10 DIAGNOSIS — Z902 Acquired absence of lung [part of]: Secondary | ICD-10-CM | POA: Diagnosis not present

## 2018-02-10 DIAGNOSIS — C786 Secondary malignant neoplasm of retroperitoneum and peritoneum: Secondary | ICD-10-CM

## 2018-02-10 DIAGNOSIS — Z7982 Long term (current) use of aspirin: Secondary | ICD-10-CM

## 2018-02-10 DIAGNOSIS — Z79899 Other long term (current) drug therapy: Secondary | ICD-10-CM

## 2018-02-10 DIAGNOSIS — C649 Malignant neoplasm of unspecified kidney, except renal pelvis: Secondary | ICD-10-CM

## 2018-02-10 LAB — CBC WITH DIFFERENTIAL (CANCER CENTER ONLY)
Abs Immature Granulocytes: 0.01 10*3/uL (ref 0.00–0.07)
Basophils Absolute: 0 10*3/uL (ref 0.0–0.1)
Basophils Relative: 1 %
EOS ABS: 0.6 10*3/uL — AB (ref 0.0–0.5)
EOS PCT: 12 %
HCT: 41.1 % (ref 39.0–52.0)
Hemoglobin: 13.5 g/dL (ref 13.0–17.0)
Immature Granulocytes: 0 %
Lymphocytes Relative: 44 %
Lymphs Abs: 2.4 10*3/uL (ref 0.7–4.0)
MCH: 28.5 pg (ref 26.0–34.0)
MCHC: 32.8 g/dL (ref 30.0–36.0)
MCV: 86.7 fL (ref 80.0–100.0)
Monocytes Absolute: 0.6 10*3/uL (ref 0.1–1.0)
Monocytes Relative: 11 %
Neutro Abs: 1.8 10*3/uL (ref 1.7–7.7)
Neutrophils Relative %: 32 %
PLATELETS: 261 10*3/uL (ref 150–400)
RBC: 4.74 MIL/uL (ref 4.22–5.81)
RDW: 14.3 % (ref 11.5–15.5)
WBC Count: 5.5 10*3/uL (ref 4.0–10.5)
nRBC: 0 % (ref 0.0–0.2)

## 2018-02-10 LAB — CMP (CANCER CENTER ONLY)
ALT: 14 U/L (ref 0–44)
AST: 23 U/L (ref 15–41)
Albumin: 4.1 g/dL (ref 3.5–5.0)
Alkaline Phosphatase: 41 U/L (ref 38–126)
Anion gap: 9 (ref 5–15)
BILIRUBIN TOTAL: 0.5 mg/dL (ref 0.3–1.2)
BUN: 22 mg/dL (ref 8–23)
CALCIUM: 9.8 mg/dL (ref 8.9–10.3)
CO2: 26 mmol/L (ref 22–32)
Chloride: 100 mmol/L (ref 98–111)
Creatinine: 2.12 mg/dL — ABNORMAL HIGH (ref 0.61–1.24)
GFR, EST NON AFRICAN AMERICAN: 30 mL/min — AB (ref >60)
GFR, Est AFR Am: 34 mL/min — ABNORMAL LOW (ref >60)
Glucose, Bld: 132 mg/dL — ABNORMAL HIGH (ref 70–99)
Potassium: 4 mmol/L (ref 3.5–5.1)
Sodium: 135 mmol/L (ref 135–145)
Total Protein: 7.4 g/dL (ref 6.5–8.1)

## 2018-02-10 NOTE — Progress Notes (Signed)
Hematology and Oncology Follow Up Visit  [name] [id number] 1945/07/23 72 y.o. 02/10/2018 9:58 AM [name], MDElkins, [name], *   Principle Diagnosis: 72 year old man with stage IV renal cell carcinoma with liver, lung and peritoneum involvement diagnosed in December 2018.  Initially presented with localized disease in 2016.   Prior Therapy: He underwent a radical nephrectomy completed by Dr. Louis Perry on February 01, 2015.  The final pathology revealed a 3.3 cm clear cell renal cell carcinoma with Fuhrman grade 3.  Margins were negative indicating stage T1 a disease  Nivolumab at 3 mg/mg and ipilimumab at 1 mg/kg cycle 1 started on 09/21/2017.  He completed 4 cycles of therapy.     Current therapy: Nivolumab maintenance therapy every 2 weeks started on December 15, 2017.  Therapy has been on hold because of toxicities.  Interim History: [name] is here for a repeat evaluation.  Since the last visit, he reports slight improvement in his overall health.  He continues report increased fatigue and tiredness which is improving slowly.  Continues to have issues with anorexia and of lost more weight but does not report any changes in his bowels.  He denies any abdominal discomfort or neurological deficits.  Skin rash is improving.  His quality of life and performance status is improving but still not at baseline.  He does not report any headaches, blurry vision, syncope or seizures.  He denies any dizziness or lethargy.  Does not report any fevers, chills or sweats.  Does not report any cough, wheezing or hemoptysis.  Does not report any chest pain, palpitation, orthopnea or leg edema.  Does not report any nausea, vomiting or early satiety. Does not report any constipation or diarrhea.  Does not report any joint pain or deformity.   Does not report frequency, urgency or hematuria.  Does not report any easy bruising or petechiae.  Does not report any skin rashes or lesions.  Denies any  changes in his mood.  He denies any heat or cold intolerance. Remaining review of systems is negative.    Medications: I have reviewed the patient's current medications.  Current Outpatient Medications  Medication Sig Dispense Refill  . amLODipine (NORVASC) 5 MG tablet Take 1 tablet (5 mg total) by mouth daily. 30 tablet 0  . aspirin 81 MG chewable tablet Chew 81 mg by mouth daily.    Marland Kitchen ibuprofen ([employer] IBUPROFEN) 200 MG tablet Take 200 mg by mouth every 8 (eight) hours as needed.    Marland Kitchen lisinopril-hydrochlorothiazide (PRINZIDE,ZESTORETIC) 20-25 MG tablet Take 1 tablet by mouth daily.    . mirtazapine (REMERON) 15 MG tablet Take 1 tablet (15 mg total) by mouth at bedtime. 30 tablet 2  . prochlorperazine (COMPAZINE) 10 MG tablet Take 1 tablet (10 mg total) by mouth every 6 (six) hours as needed for nausea or vomiting. 30 tablet 0  . ranitidine (ZANTAC) 150 MG capsule Take 150 mg by mouth daily.     No current facility-administered medications for this visit.      Allergies:  Allergies  Allergen Reactions  . Benadryl [Diphenhydramine] Other (See Comments)    Severe restless legs    Past Medical History, Surgical history, Social history, and Family History were reviewed and updated.    Physical Exam:   Blood pressure 99/70, pulse 74, temperature 97.7 F (36.5 C), temperature source Oral, resp. rate 16, height 5\' 9"  (1.753 m), weight 186 lb 4.8 oz (84.5 kg), SpO2 96 %.  ECOG: 1    General appearance: Alert, awake without any distress. Head: Atraumatic without abnormalities Oropharynx: Without any thrush or ulcers. Eyes: No scleral icterus. Lymph nodes: No lymphadenopathy noted in the cervical, supraclavicular, or axillary nodes Heart:regular rate and rhythm, without any murmurs or gallops.   Lung: Clear to auscultation without any rhonchi, wheezes or dullness to percussion. Abdomin: Soft, nontender without any shifting dullness or ascites. Musculoskeletal: No clubbing  or cyanosis. Neurological: No motor or sensory deficits. Skin: Mild erythema noted on his chest wall and back.             Lab Results: Lab Results  Component Value Date   WBC 5.2 01/26/2018   HGB 13.6 01/26/2018   HCT 40.7 01/26/2018   MCV 84.8 01/26/2018   PLT 238 01/26/2018     Chemistry      Component Value Date/Time   NA 141 01/26/2018 0810   K 4.0 01/26/2018 0810   CL 104 01/26/2018 0810   CO2 26 01/26/2018 0810   BUN 14 01/26/2018 0810   CREATININE 1.86 (H) 01/26/2018 0810      Component Value Date/Time   CALCIUM 9.7 01/26/2018 0810   ALKPHOS 38 01/26/2018 0810   AST 25 01/26/2018 0810   ALT 16 01/26/2018 0810   BILITOT 0.7 01/26/2018 0810      EXAM: CT CHEST WITH CONTRAST  CT ABDOMEN AND PELVIS WITHOUT AND WITH CONTRAST  TECHNIQUE: Multidetector CT imaging of the abdomen and pelvis was performed following the standard protocol before and during bolus administration of intravenous contrast. Multidetector CT imaging of the chest was performed following the standard protocol during bolus administration of intravenous contrast.  CONTRAST:  57mL OMNIPAQUE IOHEXOL 300 MG/ML  SOLN  COMPARISON:  CT chest dated 09/09/2017. CT abdomen/pelvis dated 08/03/2017.  FINDINGS: CT CHEST FINDINGS  Cardiovascular: Heart is normal in size.  No pericardial effusion.  No evidence of thoracic aortic aneurysm. Ectasia of the ascending thoracic aorta, measuring 3.7 cm, unchanged. Atherosclerotic calcifications of the aortic arch.  Three vessel coronary atherosclerosis.  Mediastinum/Nodes: No suspicious mediastinal lymphadenopathy.  Visualized thyroid is unremarkable.  Lungs/Pleura: Status post right lower lobectomy.  Residual 8 mm central left lower lobe nodule/metastasis (series 15/image 78), previously 1.6 x 1.8 cm. Prior 6 mm left upper lobe nodule has resolved.  No focal consolidation.  No pleural effusion or  pneumothorax.  Musculoskeletal: Mild degenerative changes of the mid thoracic spine.  CT ABDOMEN PELVIS FINDINGS  Hepatobiliary: Chronic 4.9 x 3.9 cm liver lesion in segment 4A (series 11/image 37), unchanged from 2017, benign  Residual 13 mm hypoenhancing lesion along the posterior aspect of segment 6 adjacent to the hepatic renal fossa (series 16/image 9), corresponding to the prior 3.1 x 3.2 cm heterogeneous enhancing lesion in this region, favoring improving metastasis.  Status post cholecystectomy. No intrahepatic or extrahepatic ductal dilatation.  Pancreas: Within normal limits.  Spleen: Within limits.  Adrenals/Urinary Tract: Adrenal glands within normal limits.  Status post right nephrectomy with postsurgical changes/fat packing in the surgical bed.  7 mm interpolar left renal cyst (series 16/image 39). No enhancing renal lesions. No hydronephrosis.  Bladder is mildly thick-walled although underdistended.  Stomach/Bowel: Stomach is within normal limits.  No evidence of bowel obstruction.  Appendix is not discretely visualized.  Extensive colonic diverticulosis, without evidence of diverticulitis.  Vascular/Lymphatic: No evidence of abdominal aortic aneurysm.  Atherosclerotic calcifications of the abdominal aorta and branch vessels.  14 mm short axis portacaval node (series 16/image 58), unchanged, at the  upper limits of normal.  11 mm short axis retrocaval node (series 16/image 62), previously 2.9 cm.  Reproductive: Prostate is grossly unremarkable.  Other: No abdominopelvic ascites.  1.2 x 1.9 cm peritoneal implant beneath the left mid abdominal wall (series 16/image 36), previously 2.7 x 4.2 cm.  Musculoskeletal: Mild degenerative changes at L5-S1.  IMPRESSION: Status post right nephrectomy for renal cell cancer. Status post right lower lobectomy for lung cancer.  Improving left lung metastases, with a residual 8 mm  nodule in the left lower lobe.  Improving hepatic metastasis along the posterior aspect of segment 6, now measuring 1.3 cm.  Improving upper abdominal nodal metastases, including an 11 mm short axis retrocaval node.  Improving peritoneal implant beneath the left mid abdominal wall, now measuring 1.2 x 1.9 cm.   Impression and Plan:  72 year old man with:  1.  Renal cell carcinoma diagnosed in 2016 and developed stage IV disease to the lung, peritoneum and liver noted in July 2019.  He is status post immunotherapy with nivolumab and ipilimumab and has been receiving nivolumab maintenance.  Therapy has been on hold because of toxicity associated with therapy.  Risks and benefits of resuming immunotherapy versus discontinuation and consideration for alternative therapy was reviewed.  These therapies include oral targeted therapy among others.  After discussion today, we will hold any further treatment at this time and repeat imaging studies in January 2020.  Depending on these results and his symptoms we will proceed with nivolumab or different therapy.    2.  Immune mediated complications: Long-term immune therapy associated with this treatment was discussed today.  These would include pneumonitis, hepatitis, colitis and arthritis.  He appears to be recovering although rather slowly.  I see no need for any steroid treatment.  3.  IV access: Peripheral veins have been in use without any complications.  4.  Dermatological toxicity: Improved at this time without systemic steroids.  5.  Prognosis and goals of care: His disease is incurable but therapy has been effective in palliating his disease at this time.  6.  Follow-up: 2 to 3 weeks to follow his progress.  25  minutes was spent with the patient face-to-face today.  More than 50% of time was dedicated to discussing his disease status, risks and benefits of treatment and treatment options.   Zola Button, MD 12/26/20199:58  AM

## 2018-02-10 NOTE — Telephone Encounter (Signed)
Printed calendar and avs. °

## 2018-02-22 ENCOUNTER — Telehealth: Payer: Self-pay

## 2018-02-22 NOTE — Telephone Encounter (Signed)
Nutrition Assessment   Reason for Assessment:   Patient identified on Malnutrition Screening report for weight loss and poor appetite   ASSESSMENT:   73 year old male with stage IV renal cell carcinoma.  Patient currently on nivolumab but recently has been on hold.  Past medical history CHF, CAD, GERD, CKD, HLD  Called and spoke with daughter, [name] and introduced self and service.  Daughter reports appetite is not good. Reports patient does not want to eat.  Eat about 1/2 or less typical serving (ie 1/2 sandwich). Has tried ensure/boost and does not really like them.  Denies issues with nausea effecting appetite.    Medications: mirtazapine   Labs: reviewed   Anthropometrics:   Height: 69 inches Weight: 186 lb 4.8 oz Noted on 10/30 201 lb BMI: 27  7% weight loss in the last 2 months, significant   NUTRITION DIAGNOSIS: Inadequate oral intake related to poor appetite, cancer and cancer related treatment side effects as evidenced by 7% weight loss in 2 months and decreased appetite   INTERVENTION:  Discussed briefly ways to increase calories and protein with daughter via phone.  Daughter agreeable to receiving handout in the mail to review with patient.  Contact information provided and offered nutrition follow-up.  Daughter to call with questions or concerns   MONITORING, EVALUATION, GOAL: patient will consume adequate calories to maintain weight   Next Visit: Daughter to call if needed, no follow-up planned  [name] B. [name], [place], [place] Registered Dietitian [phone] (pager)

## 2018-03-04 ENCOUNTER — Inpatient Hospital Stay: Payer: Medicare HMO | Attending: Oncology

## 2018-03-04 ENCOUNTER — Encounter (HOSPITAL_COMMUNITY): Payer: Self-pay

## 2018-03-04 ENCOUNTER — Ambulatory Visit (HOSPITAL_COMMUNITY)
Admission: RE | Admit: 2018-03-04 | Discharge: 2018-03-04 | Disposition: A | Discharge status: Home / Self Care | Payer: Medicare HMO | Source: Ambulatory Visit | Attending: Oncology | Admitting: Oncology

## 2018-03-04 DIAGNOSIS — R918 Other nonspecific abnormal finding of lung field: Secondary | ICD-10-CM | POA: Insufficient documentation

## 2018-03-04 DIAGNOSIS — C787 Secondary malignant neoplasm of liver and intrahepatic bile duct: Secondary | ICD-10-CM | POA: Diagnosis not present

## 2018-03-04 DIAGNOSIS — C641 Malignant neoplasm of right kidney, except renal pelvis: Secondary | ICD-10-CM | POA: Insufficient documentation

## 2018-03-04 DIAGNOSIS — Z905 Acquired absence of kidney: Secondary | ICD-10-CM | POA: Insufficient documentation

## 2018-03-04 DIAGNOSIS — Z79899 Other long term (current) drug therapy: Secondary | ICD-10-CM | POA: Insufficient documentation

## 2018-03-04 DIAGNOSIS — I10 Essential (primary) hypertension: Secondary | ICD-10-CM | POA: Diagnosis not present

## 2018-03-04 DIAGNOSIS — C649 Malignant neoplasm of unspecified kidney, except renal pelvis: Secondary | ICD-10-CM

## 2018-03-04 DIAGNOSIS — C7800 Secondary malignant neoplasm of unspecified lung: Secondary | ICD-10-CM | POA: Diagnosis not present

## 2018-03-04 DIAGNOSIS — Z7982 Long term (current) use of aspirin: Secondary | ICD-10-CM | POA: Diagnosis not present

## 2018-03-04 LAB — CMP (CANCER CENTER ONLY)
ALT: 10 U/L (ref 0–44)
AST: 22 U/L (ref 15–41)
Albumin: 4.1 g/dL (ref 3.5–5.0)
Alkaline Phosphatase: 37 U/L — ABNORMAL LOW (ref 38–126)
Anion gap: 11 (ref 5–15)
BUN: 16 mg/dL (ref 8–23)
CO2: 23 mmol/L (ref 22–32)
Calcium: 9.6 mg/dL (ref 8.9–10.3)
Chloride: 102 mmol/L (ref 98–111)
Creatinine: 2.22 mg/dL — ABNORMAL HIGH (ref 0.61–1.24)
GFR, Est AFR Am: 32 mL/min — ABNORMAL LOW (ref >60)
GFR, Estimated: 28 mL/min — ABNORMAL LOW (ref >60)
Glucose, Bld: 88 mg/dL (ref 70–99)
Potassium: 4.3 mmol/L (ref 3.5–5.1)
Sodium: 136 mmol/L (ref 135–145)
Total Bilirubin: 0.4 mg/dL (ref 0.3–1.2)
Total Protein: 7.5 g/dL (ref 6.5–8.1)

## 2018-03-04 LAB — CBC WITH DIFFERENTIAL (CANCER CENTER ONLY)
Abs Immature Granulocytes: 0.01 10*3/uL (ref 0.00–0.07)
Basophils Absolute: 0 10*3/uL (ref 0.0–0.1)
Basophils Relative: 1 %
Eosinophils Absolute: 0.6 10*3/uL — ABNORMAL HIGH (ref 0.0–0.5)
Eosinophils Relative: 11 %
HCT: 41 % (ref 39.0–52.0)
Hemoglobin: 13.7 g/dL (ref 13.0–17.0)
Immature Granulocytes: 0 %
LYMPHS PCT: 46 %
Lymphs Abs: 2.6 10*3/uL (ref 0.7–4.0)
MCH: 28.7 pg (ref 26.0–34.0)
MCHC: 33.4 g/dL (ref 30.0–36.0)
MCV: 85.8 fL (ref 80.0–100.0)
Monocytes Absolute: 0.4 10*3/uL (ref 0.1–1.0)
Monocytes Relative: 7 %
Neutro Abs: 2 10*3/uL (ref 1.7–7.7)
Neutrophils Relative %: 35 %
Platelet Count: 191 10*3/uL (ref 150–400)
RBC: 4.78 MIL/uL (ref 4.22–5.81)
RDW: 14.3 % (ref 11.5–15.5)
WBC Count: 5.6 10*3/uL (ref 4.0–10.5)
nRBC: 0 % (ref 0.0–0.2)

## 2018-03-07 ENCOUNTER — Inpatient Hospital Stay (HOSPITAL_BASED_OUTPATIENT_CLINIC_OR_DEPARTMENT_OTHER): Payer: Medicare HMO | Admitting: Oncology

## 2018-03-07 VITALS — BP 109/61 | HR 67 | Temp 97.5°F | Resp 18 | Ht 69.0 in | Wt 186.9 lb

## 2018-03-07 DIAGNOSIS — Z79899 Other long term (current) drug therapy: Secondary | ICD-10-CM

## 2018-03-07 DIAGNOSIS — C641 Malignant neoplasm of right kidney, except renal pelvis: Secondary | ICD-10-CM

## 2018-03-07 DIAGNOSIS — C787 Secondary malignant neoplasm of liver and intrahepatic bile duct: Secondary | ICD-10-CM | POA: Diagnosis not present

## 2018-03-07 DIAGNOSIS — R918 Other nonspecific abnormal finding of lung field: Secondary | ICD-10-CM | POA: Diagnosis not present

## 2018-03-07 DIAGNOSIS — Z7189 Other specified counseling: Secondary | ICD-10-CM

## 2018-03-07 DIAGNOSIS — Z7982 Long term (current) use of aspirin: Secondary | ICD-10-CM

## 2018-03-07 DIAGNOSIS — Z905 Acquired absence of kidney: Secondary | ICD-10-CM

## 2018-03-07 DIAGNOSIS — I10 Essential (primary) hypertension: Secondary | ICD-10-CM | POA: Diagnosis not present

## 2018-03-07 NOTE — Progress Notes (Signed)
Hematology and Oncology Follow Up Visit  [name] [id number] 12/20/1945 73 y.o. 03/07/2018 10:42 AM [name], MDElkins, [name], *   Principle Diagnosis: 73 year old man with renal cell carcinoma diagnosed in 2016.  He developed stage IV disease with liver, lung and peritoneum involvement in December 2018.     Prior Therapy: He underwent a radical nephrectomy completed by Dr. Louis Meckel on February 01, 2015.  The final pathology revealed a 3.3 cm clear cell renal cell carcinoma with Fuhrman grade 3.  Margins were negative indicating stage T1 a disease  Nivolumab at 3 mg/mg and ipilimumab at 1 mg/kg cycle 1 started on 09/21/2017.  He completed 4 cycles of therapy.     Current therapy: Nivolumab maintenance therapy every 2 weeks started on December 15, 2017.  Therapy withheld temporarily for potential toxicities.  He is under consideration to start maintenance therapy with nivolumab every 4 weeks.  Interim History: [name] is here for a follow-up visit.  Since the last visit, he reports feeling well and close to normal.  His appetite is resumed and he is sleeping better.  He denies any anorexia or weight loss.  He denies any joint pain or fatigue.  His performance status and activity level remains unchanged.  He denies any flank pain or hematuria.  He does not report any headaches, blurry vision, syncope or seizures.  He denies any alteration mental status or confusion.  Does not report any fevers, chills or sweats.  Does not report any cough, wheezing or hemoptysis.  Does not report any chest pain, palpitation, orthopnea or leg edema.  Does not report any nausea, vomiting or abdominal distention. Does not report any changes in bowel habits.  Does not report any arthralgias or myalgias.   Does not report frequency, urgency or hematuria.  Does not report any skin rashes or ecchymosis.   Denies any anxiety or depression.  He denies any hair or nail changes.  Remaining review of systems  is negative.    Medications: I have reviewed the patient's current medications.  Current Outpatient Medications  Medication Sig Dispense Refill  . amLODipine (NORVASC) 5 MG tablet Take 1 tablet (5 mg total) by mouth daily. 30 tablet 0  . aspirin 81 MG chewable tablet Chew 81 mg by mouth daily.    Marland Kitchen ibuprofen ([employer] IBUPROFEN) 200 MG tablet Take 200 mg by mouth every 8 (eight) hours as needed.    Marland Kitchen lisinopril-hydrochlorothiazide (PRINZIDE,ZESTORETIC) 20-25 MG tablet Take 1 tablet by mouth daily.    . mirtazapine (REMERON) 15 MG tablet Take 1 tablet (15 mg total) by mouth at bedtime. 30 tablet 2  . prochlorperazine (COMPAZINE) 10 MG tablet Take 1 tablet (10 mg total) by mouth every 6 (six) hours as needed for nausea or vomiting. 30 tablet 0  . ranitidine (ZANTAC) 150 MG capsule Take 150 mg by mouth daily.     No current facility-administered medications for this visit.      Allergies:  Allergies  Allergen Reactions  . Benadryl [Diphenhydramine] Other (See Comments)    Severe restless legs    Past Medical History, Surgical history, Social history, and Family History were reviewed and updated.    Physical Exam:   Blood pressure 109/61, pulse 67, temperature (!) 97.5 F (36.4 C), temperature source Oral, resp. rate 18, height 5\' 9"  (1.753 m), weight 186 lb 14.4 oz (84.8 kg), SpO2 98 %.      ECOG: 1    General appearance: Comfortable appearing without any discomfort  Head: Normocephalic without any trauma Oropharynx: Mucous membranes are moist and pink without any thrush or ulcers. Eyes: Pupils are equal and round reactive to light. Lymph nodes: No cervical, supraclavicular, inguinal or axillary lymphadenopathy.   Heart:regular rate and rhythm.  S1 and S2 without leg edema. Lung: Clear without any rhonchi or wheezes.  No dullness to percussion. Abdomin: Soft, nontender, nondistended with good bowel sounds.  No hepatosplenomegaly. Musculoskeletal: No joint deformity or  effusion.  Full range of motion noted. Neurological: No deficits noted on motor, sensory and deep tendon reflex exam. Skin: No petechial rash or dryness.  Appeared moist.               Lab Results: Lab Results  Component Value Date   WBC 5.6 03/04/2018   HGB 13.7 03/04/2018   HCT 41.0 03/04/2018   MCV 85.8 03/04/2018   PLT 191 03/04/2018     Chemistry      Component Value Date/Time   NA 136 03/04/2018 0943   K 4.3 03/04/2018 0943   CL 102 03/04/2018 0943   CO2 23 03/04/2018 0943   BUN 16 03/04/2018 0943   CREATININE 2.22 (H) 03/04/2018 0943      Component Value Date/Time   CALCIUM 9.6 03/04/2018 0943   ALKPHOS 37 (L) 03/04/2018 0943   AST 22 03/04/2018 0943   ALT 10 03/04/2018 0943   BILITOT 0.4 03/04/2018 0943      EXAM: CT CHEST, ABDOMEN AND PELVIS WITHOUT CONTRAST  TECHNIQUE: Multidetector CT imaging of the chest, abdomen and pelvis was performed following the standard protocol without IV contrast.  COMPARISON:  12/14/2017  FINDINGS: CT CHEST FINDINGS  Cardiovascular: Ascending thoracic aortic ectasia at 3.9 cm.  Coronary, aortic arch, and branch vessel atherosclerotic vascular disease. New small pericardial effusion.  Mediastinum/Nodes: Postoperative findings from prior right lobectomy. Anterior pericardial node 0.7 cm in short axis on image 42/2, formerly 0.6 cm.  Lungs/Pleura: Right lower lobectomy and likely right middle lobectomy in the past. Left lower lobe nodule 0.8 by 0.8 cm, formerly the same. Stable 0.3 cm left lower lobe nodule on image 105/6.  Musculoskeletal: Old right rib deformities from prior fractures. Chondrocalcinosis. Mild thoracic spondylosis.  CT ABDOMEN PELVIS FINDINGS  Hepatobiliary: Chronic 5.0 by 3.7 cm hypodense lesion along the anterior margin of the left hepatic lobe. No new liver lesion is appreciated on today's noncontrast exam. Prior cholecystectomy. Prior lesion posteriorly in segment 6 of the  liver is only faintly seen and appears roughly similar to prior exam, about 1.1 by 0.7 cm.  Pancreas: Unremarkable pancreatic contour.  Spleen: Unremarkable  Adrenals/Urinary Tract: Right nephrectomy with ring density around adipose tissue along the resection site, similar to prior and probably from fat packing. Adrenal glands normal. Left kidney appears unremarkable. No urinary tract calculi.  Stomach/Bowel: Considerable sigmoid colon diverticulosis with scattered diverticula in the descending colon.  Vascular/Lymphatic: Aortoiliac atherosclerotic vascular disease. Peripancreatic node borderline enlarged at 1.0 cm in short axis, formerly 0.9 cm. Retrocaval lymph node previously measuring 1.1 cm in short axis currently measures 0.7 cm in short axis.  Reproductive: Prostatomegaly.  Other: The left omental tumor implant measures 1.6 by 0.8 cm, formerly 1.9 by 1.2 cm.  Musculoskeletal: Grade 1 anterolisthesis at L4-5. Lumbar spondylosis and degenerative disc disease causing bilateral foraminal impingement at L4-5 and L5-S1. There is likely a synovial cyst or disc protrusion in the right neural foramen at L5-S1 containing nitrogen.  IMPRESSION: 1. General improvement, with reduction in size of the left omental tumor  implants and of the retrocaval lymph node compared to the prior exam. The 8 mm nodule in the left lower lobe is stable. The small metastatic lesion posteriorly in segment 6 of the liver is only faintly seen on today's exam, and mildly improved in size. No new metastatic lesions are identified. 2. New small pericardial effusion, significance uncertain. 3. Other imaging findings of potential clinical significance: Ascending aortic ectasia. Aortic Atherosclerosis (ICD10-I70.0). Coronary atherosclerosis. Chondrocalcinosis raising the possibility of CPPD arthropathy. Prior right nephrectomy. Sigmoid diverticulosis. Prostatomegaly. Lower lumbar impingement at  L4-5 at L5-S1.     Impression and Plan:  73 year old man with:  1.  Stage IV renal cell carcinoma diagnosed in July 2019.  He has documented disease of the liver and peritoneal disease.  CT scan obtained on March 04, 2018 was personally reviewed and discussed with the patient.  He continues to show positive response to immune therapy.  Risks and benefits of resuming maintenance nivolumab was discussed today.  Long-term complications associated with this therapy was reiterated.  Alternative therapy would include observation and surveillance versus oral targeted therapy.  After discussion today, he is agreeable to proceed with nivolumab maintenance every 4 weeks.  This will start in the immediate future and we will repeat imaging studies in 3 months.    2.  Immune mediated complications: I continue to educate him about potential toxicity associated with this medication.  These include pneumonitis, colitis, arthritis and thyroid disease.  3.  IV access: Peripheral veins will continue to be in use at this time.  4.  Dermatological toxicity: No issues reported at this time.  5.  Prognosis and goals of care: Therapy remains palliative at this time.  6.  Follow-up: We will be next week for restart of nivolumab.  25  minutes was spent with the patient face-to-face today.  More than 50% of time was dedicated to reviewing the natural course of disease, treatment options and reviewing imaging studies.   Zola Button, MD 1/20/202010:42 AM

## 2018-03-07 NOTE — Progress Notes (Signed)
DISCONTINUE ON PATHWAY REGIMEN - Renal Cell     A cycle is every 21 days:     Nivolumab      Ipilimumab    A cycle is every 28 days:     Nivolumab   **Always confirm dose/schedule in your pharmacy ordering system**  REASON: Other Reason PRIOR TREATMENT: [id number]: Nivolumab 3 mg/kg + Ipilimumab 1 mg/kg q21 Days x 4 Cycles, Followed by Nivolumab 480 mg q28 Days TREATMENT RESPONSE: Partial Response (PR)  START ON PATHWAY REGIMEN - Renal Cell     A cycle is every 21 days:     Nivolumab      Ipilimumab    A cycle is every 14 days:     Nivolumab   **Always confirm dose/schedule in your pharmacy ordering system**  Patient Characteristics: Metastatic, Clear Cell, First Line, Intermediate or Poor Risk AJCC M Category: M1 AJCC 8 Stage Grouping: IV Current evidence of distant metastases<= Yes AJCC T Category: TX AJCC N Category: NX Does patient have oligometastatic disease<= No Histology: Clear Cell Line of Therapy: First Line Risk Status: Intermediate Risk Intent of Therapy: Non-Curative / Palliative Intent, Discussed with Patient

## 2018-03-10 ENCOUNTER — Telehealth: Payer: Self-pay

## 2018-03-10 NOTE — Telephone Encounter (Signed)
Left a detailed msg of the patient upcoming appointment. Per 1/2o los. Mailed a letter with a calender enclosed

## 2018-03-11 ENCOUNTER — Encounter: Payer: Self-pay | Admitting: Cardiovascular Disease

## 2018-03-11 ENCOUNTER — Ambulatory Visit: Payer: Medicare HMO | Admitting: Cardiology

## 2018-03-11 ENCOUNTER — Ambulatory Visit: Payer: Medicare HMO | Admitting: Cardiovascular Disease

## 2018-03-11 VITALS — BP 80/57 | HR 72 | Ht 69.0 in | Wt 188.0 lb

## 2018-03-11 DIAGNOSIS — I2510 Atherosclerotic heart disease of native coronary artery without angina pectoris: Secondary | ICD-10-CM | POA: Diagnosis not present

## 2018-03-11 DIAGNOSIS — J449 Chronic obstructive pulmonary disease, unspecified: Secondary | ICD-10-CM | POA: Diagnosis not present

## 2018-03-11 DIAGNOSIS — I959 Hypotension, unspecified: Secondary | ICD-10-CM | POA: Diagnosis not present

## 2018-03-11 DIAGNOSIS — G5600 Carpal tunnel syndrome, unspecified upper limb: Secondary | ICD-10-CM | POA: Diagnosis not present

## 2018-03-11 DIAGNOSIS — I313 Pericardial effusion (noninflammatory): Secondary | ICD-10-CM

## 2018-03-11 DIAGNOSIS — I3139 Other pericardial effusion (noninflammatory): Secondary | ICD-10-CM

## 2018-03-11 DIAGNOSIS — I700 Atherosclerosis of aorta: Secondary | ICD-10-CM

## 2018-03-11 LAB — CBC WITH DIFFERENTIAL/PLATELET
Basophils Absolute: 0.1 10*3/uL (ref 0.0–0.2)
Basos: 1 %
EOS (ABSOLUTE): 0.7 10*3/uL — ABNORMAL HIGH (ref 0.0–0.4)
Eos: 11 %
Hematocrit: 40.1 % (ref 37.5–51.0)
Hemoglobin: 13.3 g/dL (ref 13.0–17.7)
IMMATURE GRANS (ABS): 0 10*3/uL (ref 0.0–0.1)
Immature Granulocytes: 0 %
Lymphocytes Absolute: 3 10*3/uL (ref 0.7–3.1)
Lymphs: 44 %
MCH: 28.7 pg (ref 26.6–33.0)
MCHC: 33.2 g/dL (ref 31.5–35.7)
MCV: 86 fL (ref 79–97)
Monocytes Absolute: 0.4 10*3/uL (ref 0.1–0.9)
Monocytes: 7 %
Neutrophils Absolute: 2.5 10*3/uL (ref 1.4–7.0)
Neutrophils: 37 %
Platelets: 248 10*3/uL (ref 150–450)
RBC: 4.64 x10E6/uL (ref 4.14–5.80)
RDW: 15.6 % — ABNORMAL HIGH (ref 11.6–15.4)
WBC: 6.6 10*3/uL (ref 3.4–10.8)

## 2018-03-11 LAB — COMPREHENSIVE METABOLIC PANEL
ALT: 11 IU/L (ref 0–44)
AST: 26 IU/L (ref 0–40)
Albumin/Globulin Ratio: 1.9 (ref 1.2–2.2)
Albumin: 4.5 g/dL (ref 3.7–4.7)
Alkaline Phosphatase: 41 IU/L (ref 39–117)
BUN/Creatinine Ratio: 8 — ABNORMAL LOW (ref 10–24)
BUN: 17 mg/dL (ref 8–27)
Bilirubin Total: 0.5 mg/dL (ref 0.0–1.2)
CHLORIDE: 94 mmol/L — AB (ref 96–106)
CO2: 21 mmol/L (ref 20–29)
Calcium: 9.8 mg/dL (ref 8.6–10.2)
Creatinine, Ser: 2.11 mg/dL — ABNORMAL HIGH (ref 0.76–1.27)
GFR calc Af Amer: 34 mL/min/{1.73_m2} — ABNORMAL LOW (ref >59)
GFR calc non Af Amer: 30 mL/min/{1.73_m2} — ABNORMAL LOW (ref >59)
GLOBULIN, TOTAL: 2.4 g/dL (ref 1.5–4.5)
Glucose: 89 mg/dL (ref 65–99)
Potassium: 4.2 mmol/L (ref 3.5–5.2)
Sodium: 134 mmol/L (ref 134–144)
Total Protein: 6.9 g/dL (ref 6.0–8.5)

## 2018-03-11 NOTE — Progress Notes (Signed)
Cardiology Office Note:    Date:  03/12/2018   ID:  [name], DOB 1946-02-02, MRN [id number]  PCP:  [name], MD  Cardiologist:  No primary care provider on file.  Electrophysiologist:  None   Referring MD: [name], *   Chief Complaint  Patient presents with  . Coronary Artery Disease  Transition of care from Dr. Tollie Eth, MD CAD, PVCs, hypertension, hyperlipidemia  History of Present Illness:    [name] is a 73 y.o. male with a hx of coronary artery disease (occlusion RCA with left-to-right collaterals, occluded first diagonal, 80% stenosis ramus intermedius, 50% stenosis proximal RCA, minor irregularities left main and LAD by cardiac catheterization May 2010), preserved left ventricular systolic function by echo March 2013, normal perfusion and normal systolic function by nuclear stress test April 2018, hypertension, hyperlipidemia, frequent PVCs, history of right lung lower lobectomy for lung cancer and history of right nephrectomy for kidney cancer.  He is here for routine visit for transition of care after Dr. Thurman Coyer retirement.  He reports feeling poorly today, although he cannot be more specific.  He is markedly hypotensive.  When he first checked in his blood pressure was 80/57.  When I rechecked his blood pressure several minutes later it was 87/61, equal in the right and left upper extremities.  He denies syncope or near syncope but feels a little weak.  At his oncology visit with Dr. Alen Blew on January 20 his blood pressure was 109/61, heart rate 67.  Weight that day was 187 pounds and per our scale he weighs roughly the same today  He has been receiving chemotherapy with nivolumab + ipilimumab (apparently with good tumor response) for what is presumed to be renal clear cell carcinoma metastasis involving the lung and liver, omentum, retrocaval lymph nodes.  The patient specifically denies any chest pain at rest exertion, dyspnea at rest or  with exertion, orthopnea, paroxysmal nocturnal dyspnea, syncope, palpitations, focal neurological deficits, intermittent claudication, lower extremity edema, unexplained weight gain, cough, hemoptysis or wheezing.  He has not had problems with nausea, vomiting or diarrhea but his oral intake has been schools due to anorexia.  What bothers him the most and what he complains about repeatedly is numbness in both hands in a pattern strongly suggestive of carpal tunnel syndrome.  Most recent creatinine from January 17 was 2.22, BUN 16.  Labs that was performed at the clinic today became available after he left for not much change with creatinine of 2.11 and BUN of 17.  Electrolytes were normal and liver function tests were normal.  Hemoglobin was 13.3, unchanged for a few days ago when it was 13.7.  Glucose was 89.  CT of the chest abdomen and pelvis performed on January 17 shows extensive atherosclerosis in coronaries, aorta, aortic arch and abdominal and iliac vessels.  Multiple metastatic lesions are described with a general reduction in size compared to previous studies.  A small pericardial effusion was seen.  Past Medical History:  Diagnosis Date  . Arthritis   . Chronic kidney disease    only has one kidney   . 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 02/01/2015  . Coronary artery disease    followed by Dr.Tilley  . GERD (gastroesophageal reflux disease)   . Heart murmur   . High cholesterol   . Hx of cancer of lung 1980's  . Hypertension   . Incisional hernia 08/01/2015  . Lung cancer ([place])   .  Recurrent umbilical hernia 4/97/0263  . Right renal mass     Past Surgical History:  Procedure Laterality Date  . APPENDECTOMY  age 68  . CHOLECYSTECTOMY  1980's  . LAPAROSCOPIC LYSIS OF ADHESIONS N/A 08/01/2015   Procedure: LAPAROSCOPIC LYSIS OF ADHESIONS;  Surgeon: Michael Boston, MD;  Location: [facility];  Service: General;  Laterality: N/A;  . LUNG LOBECTOMY  1992    lung cancer- patient has staples in lung not to have MRI per patient   . PILONIDAL CYST EXCISION  2011   Dr Harlow Asa  . ROBOTIC ASSITED PARTIAL NEPHRECTOMY Right 02/01/2015   Procedure: RIGHT ROBOTIC ASSISTED LAPAROSOCOPY NEPHRECTOMY;  Surgeon: Ardis Hughs, MD;  Location: [facility];  Service: Urology;  Laterality: Right;  . VENTRAL HERNIA REPAIR N/A 08/01/2015   Procedure: LAPAROSCOPIC VENTRAL WALL HERNIA WITH MESH;  Surgeon: Michael Boston, MD;  Location: [facility];  Service: General;  Laterality: N/A;    Current Medications: Current Meds  Medication Sig  . aspirin 81 MG chewable tablet Chew 81 mg by mouth daily.  Marland Kitchen ibuprofen ([employer] IBUPROFEN) 200 MG tablet Take 200 mg by mouth every 8 (eight) hours as needed.  . ranitidine (ZANTAC) 150 MG capsule Take 150 mg by mouth daily.  . [DISCONTINUED] amLODipine (NORVASC) 5 MG tablet Take 1 tablet (5 mg total) by mouth daily.  . [DISCONTINUED] lisinopril-hydrochlorothiazide (PRINZIDE,ZESTORETIC) 20-25 MG tablet Take 1 tablet by mouth daily.     Allergies:   Benadryl [diphenhydramine]   Social History   Socioeconomic History  . Marital status: Married    Spouse name: Not on file  . Number of children: Not on file  . Years of education: Not on file  . Highest education level: Not on file  Occupational History  . Not on file  Social Needs  . Financial resource strain: Not on file  . Food insecurity:    Worry: Not on file    Inability: Not on file  . Transportation needs:    Medical: Not on file    Non-medical: Not on file  Tobacco Use  . Smoking status: Former Smoker    Last attempt to quit: 01/29/1988    Years since quitting: 30.1  . Smokeless tobacco: Never Used  Substance and Sexual Activity  . Alcohol use: Yes    Comment: rare  . Drug use: No  . Sexual activity: Not on file  Lifestyle  . Physical activity:    Days per week: Not on file    Minutes per session: Not on file  . Stress: Not on file  Relationships  . Social  connections:    Talks on phone: Not on file    Gets together: Not on file    Attends religious service: Not on file    Active member of club or organization: Not on file    Attends meetings of clubs or organizations: Not on file    Relationship status: Not on file  Other Topics Concern  . Not on file  Social History Narrative  . Not on file     Family History: The patient's 34 of 5 brothers died of cancer and 1 of liver disease.  His father had coronary artery disease and his mother had Parkinson's disease ROS:   Please see the history of present illness.    All other systems are reviewed and are negative  EKGs/Labs/Other Studies Reviewed:    The following studies were reviewed today: Cardiac catheterization 2010, echo 2017, nuclear stress test 2018, notes  from Dr. Wynonia Lawman October 2018 Chest abdomen pelvis CT from January 17  EKG:  EKG is ordered today.  The ekg ordered today demonstrates normal sinus rhythm, normal tracing.  Voltage does not appear diminished.  Recent Labs: 01/06/2018: TSH 0.312 03/11/2018: ALT 11; BUN 17; Creatinine, Ser 2.11; Hemoglobin 13.3; Platelets 248; Potassium 4.2; Sodium 134  Recent Lipid Panel 12/04/16 CHOL TOTL 223 mg/dl, LDL N/A NM, HDL 19 mg/dl, TRIGLYCER 406 mg/dl and CHOL/HDL 11.9 (Calc)  Physical Exam:    VS:  BP (!) 80/57   Pulse 72   Ht 5\' 9"  (1.753 m)   Wt 188 lb (85.3 kg)   BMI 27.76 kg/m     Wt Readings from Last 3 Encounters:  03/11/18 188 lb (85.3 kg)  03/07/18 186 lb 14.4 oz (84.8 kg)  02/10/18 186 lb 4.8 oz (84.5 kg)     GEN:  Well nourished, well developed in no acute distress HEENT: Normal NECK: No JVD and no hepatojugular reflux; No carotid bruits LYMPHATICS: No lymphadenopathy CARDIAC: RRR, no murmurs, rubs, gallops RESPIRATORY:  Clear to auscultation without rales, wheezing or rhonchi  ABDOMEN: Soft, non-tender, non-distended MUSCULOSKELETAL:  No edema; No deformity  SKIN: Warm and dry NEUROLOGIC:  Alert and  oriented x 3 PSYCHIATRIC:  Normal affect   ASSESSMENT:    1. Hypotension, unspecified hypotension type   2. Pericardial effusion   3. Coronary artery disease involving native coronary artery of native heart without angina pectoris   4. Aortic atherosclerosis ([place])   5. Chronic obstructive pulmonary disease, unspecified COPD type ([place])   6. Carpal tunnel syndrome, unspecified laterality    PLAN:    In order of problems listed above:  1. Hypotension: The clinical impression is that this is due to hypovolemia.  Having said that, his labs do not show any increase in BUN/creatinine and his weight is the same as it was 4 days ago.  He does not have jugular venous distention's or other signs that would suggest cardiac tamponade.  I asked him to stop the lisinopril-hydrochlorothiazide, as well as the amlodipine and call us back with his blood pressure readings over the next week.  May have to repeat an echocardiogram if his blood pressure does not improve. 2. Small pericardial effusion: Reported on CT, without other indications of tamponade. 3. CAD: Asymptomatic, angina free.  On aspirin.  Not on statin.  Dr. Thurman Coyer note from 2018 reports statin intolerance and the fact that he does not want to try other medications for cholesterol lowering.  Considering what is likely to be a limited prognosis from his oncology diagnosis, I did not pursue the issue of lipid lowering any further today. 4. Aortic atherosclerosis: Mild ectasia of the ascending aorta at 3.9 cm. 5. COPD: Asymptomatic but also sedentary 6. Carpal tunnel sd: This is causing him more distress than anything else.  I recommended splints but he tells me he is tried in the [employer].  At his request made a referral to orthopedics.  Clearly we need to resolve his hypotension first, before he can consider surgery.   Medication Adjustments/Labs and Tests Ordered: Current medicines are reviewed at length with the patient today.  Concerns  regarding medicines are outlined above.  Orders Placed This Encounter  Procedures  . CBC with Differential  . Comprehensive Metabolic Panel (CMET)  . Ambulatory referral to Orthopedic Surgery  . EKG 12-Lead   No orders of the defined types were placed in this encounter.   Patient Instructions  Medication Instructions:  Your physician has recommended you make the following change in your medication:  1) STOP Lisinopril HCTZ 2) STOP Amlodipine   If you need a refill on your cardiac medications before your next appointment, please call your pharmacy.   Lab work: Psychologist, occupational, Health visitor today If you have labs (blood work) drawn today and your tests are completely normal, you will receive your results only by: Marland Kitchen MyChart Message (if you have MyChart) OR . A paper copy in the mail If you have any lab test that is abnormal or we need to change your treatment, we will call you to review the results.  Testing/Procedures: None ordered  Follow-Up: At [facility], you and your health needs are our priority.  As part of our continuing mission to provide you with exceptional heart care, we have created designated Provider Care Teams.  These Care Teams include your primary Cardiologist (physician) and Advanced Practice Providers (APPs -  Physician Assistants and Nurse Practitioners) who all work together to provide you with the care you need, when you need it.  You will need a follow up appointment in 6 weeks with an APP.    Any Other Special Instructions Will Be Listed Below (If Applicable). You have been referred to [facility] Dr.Gramig for carpal tunnel syndrome       Signed, Sanda Klein, MD  03/12/2018 1:06 PM    [place]

## 2018-03-11 NOTE — Patient Instructions (Signed)
Medication Instructions:  Your physician has recommended you make the following change in your medication:  1) STOP Lisinopril HCTZ 2) STOP Amlodipine   If you need a refill on your cardiac medications before your next appointment, please call your pharmacy.   Lab work: Psychologist, occupational, Health visitor today If you have labs (blood work) drawn today and your tests are completely normal, you will receive your results only by: Marland Kitchen MyChart Message (if you have MyChart) OR . A paper copy in the mail If you have any lab test that is abnormal or we need to change your treatment, we will call you to review the results.  Testing/Procedures: None ordered  Follow-Up: At [facility], you and your health needs are our priority.  As part of our continuing mission to provide you with exceptional heart care, we have created designated Provider Care Teams.  These Care Teams include your primary Cardiologist (physician) and Advanced Practice Providers (APPs -  Physician Assistants and Nurse Practitioners) who all work together to provide you with the care you need, when you need it.  You will need a follow up appointment in 6 weeks with an APP.    Any Other Special Instructions Will Be Listed Below (If Applicable). You have been referred to [facility] Dr.Gramig for carpal tunnel syndrome

## 2018-03-13 ENCOUNTER — Emergency Department (HOSPITAL_COMMUNITY): Payer: Medicare HMO

## 2018-03-13 ENCOUNTER — Other Ambulatory Visit: Payer: Self-pay

## 2018-03-13 ENCOUNTER — Encounter (HOSPITAL_COMMUNITY): Payer: Self-pay | Admitting: Emergency Medicine

## 2018-03-13 ENCOUNTER — Observation Stay (HOSPITAL_COMMUNITY)
Admission: EM | Admit: 2018-03-13 | Discharge: 2018-03-16 | Disposition: A | Discharge status: Home / Self Care | Payer: Medicare HMO | Attending: Internal Medicine | Admitting: Internal Medicine

## 2018-03-13 DIAGNOSIS — Z9049 Acquired absence of other specified parts of digestive tract: Secondary | ICD-10-CM | POA: Insufficient documentation

## 2018-03-13 DIAGNOSIS — I313 Pericardial effusion (noninflammatory): Secondary | ICD-10-CM | POA: Insufficient documentation

## 2018-03-13 DIAGNOSIS — M4316 Spondylolisthesis, lumbar region: Secondary | ICD-10-CM | POA: Insufficient documentation

## 2018-03-13 DIAGNOSIS — E43 Unspecified severe protein-calorie malnutrition: Secondary | ICD-10-CM | POA: Insufficient documentation

## 2018-03-13 DIAGNOSIS — R627 Adult failure to thrive: Secondary | ICD-10-CM | POA: Diagnosis not present

## 2018-03-13 DIAGNOSIS — E785 Hyperlipidemia, unspecified: Secondary | ICD-10-CM | POA: Diagnosis not present

## 2018-03-13 DIAGNOSIS — N400 Enlarged prostate without lower urinary tract symptoms: Secondary | ICD-10-CM | POA: Insufficient documentation

## 2018-03-13 DIAGNOSIS — Z7982 Long term (current) use of aspirin: Secondary | ICD-10-CM | POA: Insufficient documentation

## 2018-03-13 DIAGNOSIS — K5730 Diverticulosis of large intestine without perforation or abscess without bleeding: Secondary | ICD-10-CM | POA: Insufficient documentation

## 2018-03-13 DIAGNOSIS — M47816 Spondylosis without myelopathy or radiculopathy, lumbar region: Secondary | ICD-10-CM | POA: Insufficient documentation

## 2018-03-13 DIAGNOSIS — R110 Nausea: Secondary | ICD-10-CM

## 2018-03-13 DIAGNOSIS — C787 Secondary malignant neoplasm of liver and intrahepatic bile duct: Secondary | ICD-10-CM | POA: Diagnosis not present

## 2018-03-13 DIAGNOSIS — I129 Hypertensive chronic kidney disease with stage 1 through stage 4 chronic kidney disease, or unspecified chronic kidney disease: Secondary | ICD-10-CM | POA: Diagnosis not present

## 2018-03-13 DIAGNOSIS — I3139 Other pericardial effusion (noninflammatory): Secondary | ICD-10-CM

## 2018-03-13 DIAGNOSIS — Z87891 Personal history of nicotine dependence: Secondary | ICD-10-CM | POA: Insufficient documentation

## 2018-03-13 DIAGNOSIS — Z791 Long term (current) use of non-steroidal anti-inflammatories (NSAID): Secondary | ICD-10-CM | POA: Diagnosis not present

## 2018-03-13 DIAGNOSIS — I2510 Atherosclerotic heart disease of native coronary artery without angina pectoris: Secondary | ICD-10-CM | POA: Insufficient documentation

## 2018-03-13 DIAGNOSIS — N183 Chronic kidney disease, stage 3 unspecified: Secondary | ICD-10-CM

## 2018-03-13 DIAGNOSIS — Z79899 Other long term (current) drug therapy: Secondary | ICD-10-CM | POA: Diagnosis not present

## 2018-03-13 DIAGNOSIS — C7800 Secondary malignant neoplasm of unspecified lung: Secondary | ICD-10-CM | POA: Diagnosis not present

## 2018-03-13 DIAGNOSIS — I700 Atherosclerosis of aorta: Secondary | ICD-10-CM | POA: Insufficient documentation

## 2018-03-13 DIAGNOSIS — M47814 Spondylosis without myelopathy or radiculopathy, thoracic region: Secondary | ICD-10-CM | POA: Insufficient documentation

## 2018-03-13 DIAGNOSIS — K219 Gastro-esophageal reflux disease without esophagitis: Secondary | ICD-10-CM | POA: Insufficient documentation

## 2018-03-13 DIAGNOSIS — I10 Essential (primary) hypertension: Secondary | ICD-10-CM

## 2018-03-13 DIAGNOSIS — R531 Weakness: Secondary | ICD-10-CM | POA: Diagnosis not present

## 2018-03-13 DIAGNOSIS — N179 Acute kidney failure, unspecified: Secondary | ICD-10-CM

## 2018-03-13 DIAGNOSIS — E7800 Pure hypercholesterolemia, unspecified: Secondary | ICD-10-CM | POA: Diagnosis not present

## 2018-03-13 DIAGNOSIS — Z905 Acquired absence of kidney: Secondary | ICD-10-CM | POA: Diagnosis not present

## 2018-03-13 DIAGNOSIS — C786 Secondary malignant neoplasm of retroperitoneum and peritoneum: Secondary | ICD-10-CM | POA: Insufficient documentation

## 2018-03-13 DIAGNOSIS — Z85528 Personal history of other malignant neoplasm of kidney: Secondary | ICD-10-CM | POA: Insufficient documentation

## 2018-03-13 DIAGNOSIS — C641 Malignant neoplasm of right kidney, except renal pelvis: Secondary | ICD-10-CM | POA: Insufficient documentation

## 2018-03-13 DIAGNOSIS — M1990 Unspecified osteoarthritis, unspecified site: Secondary | ICD-10-CM | POA: Insufficient documentation

## 2018-03-13 LAB — URINALYSIS, ROUTINE W REFLEX MICROSCOPIC
Bacteria, UA: NONE SEEN
Bilirubin Urine: NEGATIVE
GLUCOSE, UA: NEGATIVE mg/dL
HGB URINE DIPSTICK: NEGATIVE
Ketones, ur: NEGATIVE mg/dL
NITRITE: NEGATIVE
Protein, ur: NEGATIVE mg/dL
Specific Gravity, Urine: 1.021 (ref 1.005–1.030)
pH: 5 (ref 5.0–8.0)

## 2018-03-13 LAB — BASIC METABOLIC PANEL
Anion gap: 11 (ref 5–15)
BUN: 24 mg/dL — AB (ref 8–23)
CO2: 24 mmol/L (ref 22–32)
CREATININE: 2.36 mg/dL — AB (ref 0.61–1.24)
Calcium: 9.8 mg/dL (ref 8.9–10.3)
Chloride: 94 mmol/L — ABNORMAL LOW (ref 98–111)
GFR calc Af Amer: 30 mL/min — ABNORMAL LOW (ref >60)
GFR calc non Af Amer: 26 mL/min — ABNORMAL LOW (ref >60)
Glucose, Bld: 101 mg/dL — ABNORMAL HIGH (ref 70–99)
Potassium: 4 mmol/L (ref 3.5–5.1)
Sodium: 129 mmol/L — ABNORMAL LOW (ref 135–145)

## 2018-03-13 LAB — CBC
HCT: 39.5 % (ref 39.0–52.0)
Hemoglobin: 13.1 g/dL (ref 13.0–17.0)
MCH: 28.4 pg (ref 26.0–34.0)
MCHC: 33.2 g/dL (ref 30.0–36.0)
MCV: 85.7 fL (ref 80.0–100.0)
NRBC: 0 % (ref 0.0–0.2)
Platelets: 254 10*3/uL (ref 150–400)
RBC: 4.61 MIL/uL (ref 4.22–5.81)
RDW: 14.3 % (ref 11.5–15.5)
WBC: 6.4 10*3/uL (ref 4.0–10.5)

## 2018-03-13 LAB — I-STAT TROPONIN, ED: Troponin i, poc: 0 ng/mL (ref 0.00–0.08)

## 2018-03-13 LAB — CBG MONITORING, ED: GLUCOSE-CAPILLARY: 103 mg/dL — AB (ref 70–99)

## 2018-03-13 MED ORDER — ONDANSETRON HCL 4 MG/2ML IJ SOLN
4.0000 mg | Freq: Four times a day (QID) | INTRAMUSCULAR | Status: DC | PRN
  Administered 2018-03-14: 4 mg via INTRAVENOUS
  Filled 2018-03-13: qty 2

## 2018-03-13 MED ORDER — ENOXAPARIN SODIUM 40 MG/0.4ML ~~LOC~~ SOLN: 40.0000 mg | SUBCUTANEOUS | Status: DC

## 2018-03-13 MED ORDER — SODIUM CHLORIDE 0.9 % IV BOLUS
1000.0000 mL | Freq: Once | INTRAVENOUS | Status: AC
  Administered 2018-03-13: 1000 mL via INTRAVENOUS

## 2018-03-13 MED ORDER — PROCHLORPERAZINE MALEATE 10 MG PO TABS
10.0000 mg | ORAL_TABLET | Freq: Four times a day (QID) | ORAL | Status: DC | PRN
  Filled 2018-03-13: qty 1

## 2018-03-13 MED ORDER — ENSURE ENLIVE PO LIQD
237.0000 mL | Freq: Two times a day (BID) | ORAL | Status: DC
  Administered 2018-03-14: 237 mL via ORAL

## 2018-03-13 MED ORDER — SODIUM CHLORIDE 0.9% FLUSH
3.0000 mL | Freq: Once | INTRAVENOUS | Status: AC
  Administered 2018-03-13: 3 mL via INTRAVENOUS

## 2018-03-13 MED ORDER — SODIUM CHLORIDE 0.9 % IV SOLN
INTRAVENOUS | Status: DC
  Administered 2018-03-14 – 2018-03-16 (×5): via INTRAVENOUS

## 2018-03-13 MED ORDER — ASPIRIN EC 81 MG PO TBEC
81.0000 mg | DELAYED_RELEASE_TABLET | Freq: Every day | ORAL | Status: DC
  Administered 2018-03-14 – 2018-03-16 (×3): 81 mg via ORAL
  Filled 2018-03-13 (×3): qty 1

## 2018-03-13 NOTE — ED Provider Notes (Signed)
[facility] DEPT Provider Note   CSN: [id number] Arrival date & time: 03/13/18  1935     History   Chief Complaint Chief Complaint  Patient presents with  . Weakness    HPI [name] is a 73 y.o. male.  HPI  73 year old male presents with generalized weakness.  The patient states that he is been feeling progressively weak over the last couple days.  Saw cardiology a couple days ago and was told his blood pressure was too low and told to stop his blood pressure medicines until seen by Dr. Alen Blew.  He feels much weaker today.  He states he does not think he is drinking enough water and feels dehydrated.  There is no dizziness, headache, chest pain or shortness of breath, vomiting or diarrhea.  He states he took a bite of hamburger today and it immediately came back up but he does not think it went all the way down to his stomach.  He has been trying to drink some Ensure.  His urine is a little dark today but no dysuria. No abdominal pain. No focal weakness.  Past Medical History:  Diagnosis Date  . Arthritis   . Chronic kidney disease    only has one kidney   . 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 02/01/2015  . Coronary artery disease    followed by Dr.Tilley  . GERD (gastroesophageal reflux disease)   . Heart murmur   . High cholesterol   . Hx of cancer of lung 1980's  . Hypertension   . Incisional hernia 08/01/2015  . Lung cancer ([place])   . Recurrent umbilical hernia 05/25/8117  . Right renal mass     Patient Active Problem List   Diagnosis Date Noted  . Generalized weakness 03/13/2018  . Hyperlipidemia 11/30/2017  . Kidney cancer, primary, with metastasis from kidney to other site [facility]) 09/14/2017  . Goals of care, counseling/discussion 09/14/2017  . Recurrent umbilical hernia 14/78/2956  . Incisional hernia 08/01/2015  . Hypertensive heart disease without CHF   . GERD (gastroesophageal reflux disease)   .  Chronic kidney disease   . Coronary artery disease   . 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 02/01/2015    Past Surgical History:  Procedure Laterality Date  . APPENDECTOMY  age 73  . CHOLECYSTECTOMY  1980's  . LAPAROSCOPIC LYSIS OF ADHESIONS N/A 08/01/2015   Procedure: LAPAROSCOPIC LYSIS OF ADHESIONS;  Surgeon: Michael Boston, MD;  Location: [facility];  Service: General;  Laterality: N/A;  . LUNG LOBECTOMY  1992   lung cancer- patient has staples in lung not to have MRI per patient   . PILONIDAL CYST EXCISION  2011   Dr Harlow Asa  . ROBOTIC ASSITED PARTIAL NEPHRECTOMY Right 02/01/2015   Procedure: RIGHT ROBOTIC ASSISTED LAPAROSOCOPY NEPHRECTOMY;  Surgeon: Ardis Hughs, MD;  Location: [facility];  Service: Urology;  Laterality: Right;  . VENTRAL HERNIA REPAIR N/A 08/01/2015   Procedure: LAPAROSCOPIC VENTRAL WALL HERNIA WITH MESH;  Surgeon: Michael Boston, MD;  Location: [facility];  Service: General;  Laterality: N/A;        Home Medications    Prior to Admission medications   Medication Sig Start Date End Date Taking? Authorizing Provider  amLODipine (NORVASC) 5 MG tablet Take 5 mg by mouth daily.   Yes [provider]  aspirin 81 MG chewable tablet Chew 81 mg by mouth daily.   Yes [provider]  ibuprofen ([facility]  IBUPROFEN) 200 MG tablet Take 200 mg by mouth every 8 (eight) hours as needed for headache, mild pain or moderate pain.    Yes [provider]  lisinopril-hydrochlorothiazide (PRINZIDE,ZESTORETIC) 20-25 MG tablet Take 1 tablet by mouth daily.   Yes [provider]  prochlorperazine (COMPAZINE) 10 MG tablet Take 10 mg by mouth every 6 (six) hours as needed for nausea or vomiting.   Yes [provider]    Family History No family history on file.  Social History Social History   Tobacco Use  . Smoking status: Former Smoker    Last attempt to quit: 01/29/1988    Years since quitting: 30.1  . Smokeless  tobacco: Never Used  Substance Use Topics  . Alcohol use: Yes    Comment: rare  . Drug use: No     Allergies   Benadryl [diphenhydramine]   Review of Systems Review of Systems  Constitutional: Positive for fatigue. Negative for fever.  Respiratory: Negative for shortness of breath.   Cardiovascular: Negative for chest pain.  Gastrointestinal: Positive for vomiting. Negative for abdominal pain and nausea.  Neurological: Positive for weakness. Negative for dizziness and headaches.  All other systems reviewed and are negative.    Physical Exam Updated Vital Signs BP 102/67   Pulse 71   Temp 97.9 F (36.6 C) (Oral)   Resp 15   SpO2 96%   Physical Exam Vitals signs and nursing note reviewed.  Constitutional:      General: He is not in acute distress.    Appearance: He is well-developed. He is not ill-appearing or diaphoretic.  HENT:     Head: Normocephalic and atraumatic.     Right Ear: External ear normal.     Left Ear: External ear normal.     Nose: Nose normal.  Eyes:     General:        Right eye: No discharge.        Left eye: No discharge.  Neck:     Musculoskeletal: Neck supple.  Cardiovascular:     Rate and Rhythm: Normal rate and regular rhythm.     Heart sounds: Normal heart sounds.  Pulmonary:     Effort: Pulmonary effort is normal.     Breath sounds: Normal breath sounds.  Abdominal:     Palpations: Abdomen is soft.     Tenderness: There is no abdominal tenderness.  Skin:    General: Skin is warm and dry.  Neurological:     Mental Status: He is alert and oriented to person, place, and time.     Comments: CN 3-12 grossly intact. 5/5 strength in all 4 extremities. Grossly normal sensation. Normal finger to nose.   Psychiatric:        Mood and Affect: Mood is not anxious.      ED Treatments / Results  Labs (all labs ordered are listed, but only abnormal results are displayed) Labs Reviewed  BASIC METABOLIC PANEL - Abnormal; Notable for the  following components:      Result Value   Sodium 129 (*)    Chloride 94 (*)    Glucose, Bld 101 (*)    BUN 24 (*)    Creatinine, Ser 2.36 (*)    GFR calc non Af Amer 26 (*)    GFR calc Af Amer 30 (*)    All other components within normal limits  URINALYSIS, ROUTINE W REFLEX MICROSCOPIC - Abnormal; Notable for the following components:   Leukocytes, UA TRACE (*)  All other components within normal limits  CBG MONITORING, ED - Abnormal; Notable for the following components:   Glucose-Capillary 103 (*)    All other components within normal limits  CBC  CBC  CREATININE, SERUM  I-STAT TROPONIN, ED    EKG EKG Interpretation  Date/Time:  Sunday March 13 2018 21:58:10 EST Ventricular Rate:  69 PR Interval:    QRS Duration: 116 QT Interval:  399 QTC Calculation: 428 R Axis:   -19 Text Interpretation:  Sinus rhythm Nonspecific intraventricular conduction delay Low voltage, extremity leads Nonspecific T abnormalities, anterior leads Baseline wander in lead(s) V3 V4 Confirmed by Sherwood Gambler [phone]) on 03/13/2018 10:03:02 PM   Radiology Dg Chest 2 View  Result Date: 03/13/2018 CLINICAL DATA:  Weakness EXAM: CHEST - 2 VIEW COMPARISON:  08/19/2015 FINDINGS: Chronic right pleural and parenchymal scarring with postsurgical changes in the right hilar area. No acute consolidation or effusion. Stable cardiomediastinal silhouette. No pneumothorax. IMPRESSION: No active cardiopulmonary disease. Stable right postsurgical changes Electronically Signed   By: Donavan Foil M.D.   On: 03/13/2018 23:18    Procedures Ultrasound ED Echo Date/Time: 03/13/2018 11:49 PM Performed by: Sherwood Gambler, MD Authorized by: Sherwood Gambler, MD   Procedure details:    Indications comment:  Weakness, low voltage EKG   Views: subxiphoid     Images: archived     Limitations:  Acoustic shadowing and body habitus Findings:    Findings comment:  Moderate pericardial effusion Impression comment:   Moderate pericardial effusion, no tamponade   (including critical care time)  Medications Ordered in ED Medications  prochlorperazine (COMPAZINE) tablet 10 mg (has no administration in time range)  enoxaparin (LOVENOX) injection 40 mg (has no administration in time range)  aspirin EC tablet 81 mg (has no administration in time range)  ondansetron (ZOFRAN) injection 4 mg (has no administration in time range)  0.9 %  sodium chloride infusion (has no administration in time range)  feeding supplement (ENSURE ENLIVE) (ENSURE ENLIVE) liquid 237 mL (has no administration in time range)  sodium chloride flush (NS) 0.9 % injection 3 mL (3 mLs Intravenous Given 03/13/18 2208)  sodium chloride 0.9 % bolus 1,000 mL (1,000 mLs Intravenous New Bag/Given 03/13/18 2208)     Initial Impression / Assessment and Plan / ED Course  I have reviewed the triage vital signs and the nursing notes.  Pertinent labs & imaging results that were available during my care of the patient were reviewed by me and considered in my medical decision making (see chart for details).     Patient here with generalized weakness.  His exam is nonrevealing except for some mildly diminished cardiac sounds.  He is in no acute distress.  Creatinine is a little worse than before and he only has 1 kidney.  He is been given IV fluids.  Chart review shows that he recently was diagnosed with a small pericardial effusion by CT.  Given the low voltage on his ECG which appears new as well as nonspecific EKG changes, a bedside echo was performed by myself.  While it is somewhat limited, there does appear to be moderate pericardial effusion versus artifact.  However with the other findings I am concerned for pericardial effusion without tamponade.  Thus will admit to the hospital service for observation and echo tomorrow.  Final Clinical Impressions(s) / ED Diagnoses   Final diagnoses:  Generalized weakness    ED Discharge Orders    None        Sherwood Gambler,  MD 03/13/18 2352

## 2018-03-13 NOTE — ED Notes (Signed)
Pt. CBG 103, RN,[place] made aware.

## 2018-03-13 NOTE — ED Triage Notes (Signed)
Patient c/o generalized weakness with one episode of vomiting today. Denies chest pain and SOB. Denies diarrhea. Hx kidney and lung cancer. Last treatment in November. Scheduled treatment on Tuesday.

## 2018-03-14 ENCOUNTER — Encounter (HOSPITAL_COMMUNITY): Payer: Self-pay | Admitting: *Deleted

## 2018-03-14 ENCOUNTER — Other Ambulatory Visit: Payer: Self-pay

## 2018-03-14 ENCOUNTER — Observation Stay (HOSPITAL_BASED_OUTPATIENT_CLINIC_OR_DEPARTMENT_OTHER): Payer: Medicare HMO

## 2018-03-14 ENCOUNTER — Observation Stay (HOSPITAL_COMMUNITY): Payer: Medicare HMO

## 2018-03-14 ENCOUNTER — Telehealth: Payer: Self-pay

## 2018-03-14 DIAGNOSIS — I319 Disease of pericardium, unspecified: Secondary | ICD-10-CM | POA: Diagnosis not present

## 2018-03-14 DIAGNOSIS — R109 Unspecified abdominal pain: Secondary | ICD-10-CM | POA: Diagnosis not present

## 2018-03-14 DIAGNOSIS — R531 Weakness: Secondary | ICD-10-CM | POA: Diagnosis not present

## 2018-03-14 DIAGNOSIS — E43 Unspecified severe protein-calorie malnutrition: Secondary | ICD-10-CM

## 2018-03-14 LAB — CBC WITH DIFFERENTIAL/PLATELET
Abs Immature Granulocytes: 0.02 10*3/uL (ref 0.00–0.07)
BASOS PCT: 1 %
Basophils Absolute: 0 10*3/uL (ref 0.0–0.1)
EOS ABS: 0.6 10*3/uL — AB (ref 0.0–0.5)
Eosinophils Relative: 10 %
HCT: 37.4 % — ABNORMAL LOW (ref 39.0–52.0)
HEMOGLOBIN: 12.3 g/dL — AB (ref 13.0–17.0)
Immature Granulocytes: 0 %
Lymphocytes Relative: 41 %
Lymphs Abs: 2.3 10*3/uL (ref 0.7–4.0)
MCH: 29.1 pg (ref 26.0–34.0)
MCHC: 32.9 g/dL (ref 30.0–36.0)
MCV: 88.4 fL (ref 80.0–100.0)
MONOS PCT: 7 %
Monocytes Absolute: 0.4 10*3/uL (ref 0.1–1.0)
Neutro Abs: 2.3 10*3/uL (ref 1.7–7.7)
Neutrophils Relative %: 41 %
Platelets: 232 10*3/uL (ref 150–400)
RBC: 4.23 MIL/uL (ref 4.22–5.81)
RDW: 14.5 % (ref 11.5–15.5)
WBC: 5.5 10*3/uL (ref 4.0–10.5)
nRBC: 0 % (ref 0.0–0.2)

## 2018-03-14 LAB — BASIC METABOLIC PANEL
Anion gap: 8 (ref 5–15)
BUN: 21 mg/dL (ref 8–23)
CALCIUM: 9.2 mg/dL (ref 8.9–10.3)
CO2: 26 mmol/L (ref 22–32)
Chloride: 97 mmol/L — ABNORMAL LOW (ref 98–111)
Creatinine, Ser: 2.18 mg/dL — ABNORMAL HIGH (ref 0.61–1.24)
GFR calc Af Amer: 33 mL/min — ABNORMAL LOW (ref >60)
GFR calc non Af Amer: 29 mL/min — ABNORMAL LOW (ref >60)
Glucose, Bld: 95 mg/dL (ref 70–99)
Potassium: 4.1 mmol/L (ref 3.5–5.1)
Sodium: 131 mmol/L — ABNORMAL LOW (ref 135–145)

## 2018-03-14 LAB — ECHOCARDIOGRAM COMPLETE
Height: 69 in
Weight: 2952.4 oz

## 2018-03-14 MED ORDER — ADULT MULTIVITAMIN W/MINERALS CH
1.0000 | ORAL_TABLET | Freq: Every day | ORAL | Status: DC
  Administered 2018-03-15 – 2018-03-16 (×2): 1 via ORAL
  Filled 2018-03-14 (×4): qty 1

## 2018-03-14 MED ORDER — PANTOPRAZOLE SODIUM 40 MG PO TBEC
40.0000 mg | DELAYED_RELEASE_TABLET | Freq: Every day | ORAL | Status: DC
  Administered 2018-03-14 – 2018-03-15 (×2): 40 mg via ORAL
  Filled 2018-03-14 (×3): qty 1

## 2018-03-14 MED ORDER — ENSURE ENLIVE PO LIQD
237.0000 mL | Freq: Three times a day (TID) | ORAL | Status: DC
  Administered 2018-03-14 – 2018-03-16 (×5): 237 mL via ORAL

## 2018-03-14 MED ORDER — FAMOTIDINE 20 MG PO TABS: 20.0000 mg | ORAL_TABLET | Freq: Every day | ORAL | Status: DC

## 2018-03-14 MED ORDER — TRAZODONE HCL 50 MG PO TABS
50.0000 mg | ORAL_TABLET | Freq: Once | ORAL | Status: AC
  Administered 2018-03-14: 50 mg via ORAL
  Filled 2018-03-14: qty 1

## 2018-03-14 MED ORDER — HYDROCODONE-ACETAMINOPHEN 5-325 MG PO TABS
1.0000 | ORAL_TABLET | Freq: Four times a day (QID) | ORAL | Status: DC | PRN
  Administered 2018-03-14: 2 via ORAL
  Filled 2018-03-14: qty 2

## 2018-03-14 MED ORDER — ALUM & MAG HYDROXIDE-SIMETH 200-200-20 MG/5ML PO SUSP: 30.0000 mL | Freq: Four times a day (QID) | ORAL | Status: DC | PRN

## 2018-03-14 MED ORDER — ENOXAPARIN SODIUM 30 MG/0.3ML ~~LOC~~ SOLN
30.0000 mg | SUBCUTANEOUS | Status: DC
  Administered 2018-03-15 – 2018-03-16 (×2): 30 mg via SUBCUTANEOUS
  Filled 2018-03-14 (×2): qty 0.3

## 2018-03-14 MED ORDER — ACETAMINOPHEN 325 MG PO TABS
650.0000 mg | ORAL_TABLET | Freq: Four times a day (QID) | ORAL | Status: DC | PRN
  Administered 2018-03-15 – 2018-03-16 (×2): 650 mg via ORAL
  Filled 2018-03-14 (×2): qty 2

## 2018-03-14 NOTE — Progress Notes (Signed)
Initial Nutrition Assessment  DOCUMENTATION CODES:   Severe malnutrition in context of chronic illness  INTERVENTION:   Ensure Enlive po TID, each supplement provides 350 kcal and 20 grams of protein MVI daily  NUTRITION DIAGNOSIS:   Severe Malnutrition related to chronic illness, cancer and cancer related treatments as evidenced by energy intake < or equal to 75% for > or equal to 1 month, mild muscle depletion, percent weight loss.  GOAL:   Patient will meet greater than or equal to 90% of their needs  MONITOR:   PO intake, Supplement acceptance, Weight trends, Labs  REASON FOR ASSESSMENT:   Malnutrition Screening Tool    ASSESSMENT:   Patient with PMH significant for metastatic renal cell cancer (mets to lung, liver, and peritoneum) on nivolumab (last dose in Nov 2019) who presents with generalized fatigue and loss of appetite. Unclear etiology but likely related to anti-cancer immunotherapy and cancer itself.   Pt reports he eats three small meals daily that consist of: B- slice of toast or egg sandwich; L- hamburger; D- hamburger or soup.  Pt endorses he often forces himself to eat daily. States food doesn't taste the same and when he begins eating, he doesn't want food anymore. Daughter at bedside reports a [place] RD called them to schedule an appointment a couple weeks ago but they did not reach back out because he was doing well at the time. They received information regarding nutrition in the mail. Encouraged daughter to reach out. This RD to forward note.   Discussed the importance of protein intake for preservation of lean body mass. Discussed eating small more frequent meals and encouraged the use of supplementation when appetite is poor. Pt recently tried carnation instant breakfast at home and enjoyed it. He liked Ensure that was provided this stay. Will continue.   Pt reports his UBW prior to beginning treatment in August stayed around 212 lb. States since  then he lost 15-20 lb. Records indicate pt weighed 210 lb on 09/21/17 and 184 lb this admission (12.4% wt loss in 5 month, significant for time frame). Nutrition-Focused physical exam completed.   Medications reviewed and include: NS @ 125 ml/hr Labs reviewed: Na 131 (L)   NUTRITION - FOCUSED PHYSICAL EXAM:    Most Recent Value  Orbital Region  No depletion  Upper Arm Region  Mild depletion  Thoracic and Lumbar Region  No depletion  Buccal Region  No depletion  Temple Region  No depletion  Clavicle Bone Region  No depletion  Clavicle and Acromion Bone Region  No depletion  Scapular Bone Region  No depletion  Dorsal Hand  No depletion  Patellar Region  No depletion  Anterior Thigh Region  Mild depletion  Posterior Calf Region  Mild depletion  Edema (RD Assessment)  None     Diet Order:   Diet Order            Diet regular Room service appropriate? Yes; Fluid consistency: Thin  Diet effective now              EDUCATION NEEDS:   Education needs have been addressed  Skin:  Skin Assessment: Reviewed RN Assessment  Last BM:  1/26  Height:   Ht Readings from Last 1 Encounters:  03/14/18 5\' 9"  (1.753 m)    Weight:   Wt Readings from Last 1 Encounters:  03/14/18 83.7 kg    Ideal Body Weight:  72.7 kg  BMI:  Body mass index is 27.25 kg/m.  Estimated  Nutritional Needs:   Kcal:  2200-2400 kcal  Protein:  110-125 grams  Fluid:  >/= 2.2 L/day   [name] RD, LDN Clinical Nutrition Pager # - [phone]

## 2018-03-14 NOTE — Evaluation (Signed)
Physical Therapy Evaluation Patient Details Name: [name] MRN: [id number] DOB: [birth year] Today's Date: 03/14/2018   History of Present Illness  73 y.o. male  with hx of metastatic clear cell renal cell CA (mets to lung, liver, and peritoneum) on nivolumab (last dose in Nov 2019) who presents with generalized fatigue and loss of appetite.  Clinical Impression  Pt admitted with above diagnosis. Pt currently with functional limitations due to the deficits listed below (see PT Problem List).  Pt presents with undteady gait, delayed balance reactions; will benefit from HHPT and may need cane vs RW depending on progress;  Pt will benefit from skilled PT to increase their independence and safety with mobility to allow discharge to the venue listed below.       Follow Up Recommendations Home health PT;Supervision - Intermittent    Equipment Recommendations  Other (comment)(TBD)    Recommendations for Other Services       Precautions / Restrictions Precautions Precautions: Fall Restrictions Weight Bearing Restrictions: No      Mobility  Bed Mobility Overal bed mobility: Needs Assistance Bed Mobility: Supine to Sit;Sit to Supine     Supine to sit: Supervision Sit to supine: Supervision   General bed mobility comments: for safety  Transfers Overall transfer level: Needs assistance Equipment used: None Transfers: Sit to/from Stand Sit to Stand: Min guard         General transfer comment: light assist to steady upon standing  Ambulation/Gait Ambulation/Gait assistance: Min assist Gait Distance (Feet): 80 Feet Assistive device: None Gait Pattern/deviations: Step-through pattern;Decreased stride length;Drifts right/left     General Gait Details: LOB x3 with slight scissoring and drifting noted, min assist to recover. balance reactions delayed  Stairs            Wheelchair Mobility    Modified Rankin (Stroke Patients Only)       Balance Overall balance  assessment: Needs assistance Sitting-balance support: Feet supported;No upper extremity supported Sitting balance-Leahy Scale: Good     Standing balance support: During functional activity;No upper extremity supported Standing balance-Leahy Scale: Fair Standing balance comment: requires assist for balance when wt shifting             High level balance activites: Direction changes;Turns High Level Balance Comments: LOB x3; denies falls             Pertinent Vitals/Pain Pain Assessment: No/denies pain    Home Living Family/patient expects to be discharged to:: Private residence Living Arrangements: Alone Available Help at Discharge: Family;Available PRN/intermittently Type of Home: House Home Access: Stairs to enter   Entrance Stairs-Number of Steps: 4 Home Layout: One level Home Equipment: Walker - 2 wheels;Cane - single point      Prior Function Level of Independence: Independent               Hand Dominance        Extremity/Trunk Assessment   Upper Extremity Assessment Upper Extremity Assessment: Overall WFL for tasks assessed    Lower Extremity Assessment Lower Extremity Assessment: Overall WFL for tasks assessed       Communication   Communication: No difficulties  Cognition Arousal/Alertness: Awake/alert Behavior During Therapy: Flat affect Overall Cognitive Status: Within Functional Limits for tasks assessed                                 General Comments: verbalizes minimally, responds slowly, pt dtr answers for him frequently d/t delays  General Comments      Exercises     Assessment/Plan    PT Assessment Patient needs continued PT services  PT Problem List Decreased activity tolerance;Decreased balance;Decreased mobility       PT Treatment Interventions DME instruction;Gait training;Functional mobility training;Therapeutic activities;Balance training;Therapeutic exercise;Stair training;Patient/family  education    PT Goals (Current goals can be found in the Care Plan section)  Acute Rehab PT Goals Patient Stated Goal: home PT Goal Formulation: With patient/family Time For Goal Achievement: 03/28/18 Potential to Achieve Goals: Good    Frequency Min 3X/week   Barriers to discharge        Co-evaluation               AM-PAC PT "6 Clicks" Mobility  Outcome Measure Help needed turning from your back to your side while in a flat bed without using bedrails?: A Little Help needed moving from lying on your back to sitting on the side of a flat bed without using bedrails?: A Little Help needed moving to and from a bed to a chair (including a wheelchair)?: A Little Help needed standing up from a chair using your arms (e.g., wheelchair or bedside chair)?: A Little Help needed to walk in hospital room?: A Little Help needed climbing 3-5 steps with a railing? : A Little 6 Click Score: 18    End of Session Equipment Utilized During Treatment: Gait belt Activity Tolerance: Patient limited by fatigue Patient left: in bed;with call bell/phone within reach;with bed alarm set;with family/visitor present   PT Visit Diagnosis: Difficulty in walking, not elsewhere classified (R26.2);Unsteadiness on feet (R26.81)    Time: 1610-9604 PT Time Calculation (min) (ACUTE ONLY): 12 min   Charges:   PT Evaluation $PT Eval Low Complexity: 1 Low          Kenyon Ana, PT  Pager: [phone] Acute Rehab Dept [facility]): [phone]   03/14/2018   [facility] 03/14/2018, 2:21 PM

## 2018-03-14 NOTE — Progress Notes (Signed)
I have seen and assessed patient and agree with Dr. Romilda Garret assessment and plan.  Patient is 73 year old gentleman history of metastatic clear-cell renal cell cancer with mets to the lung, liver and peritoneum on nivolumab last dose November 2019 presented with generalized fatigue, loss of appetite, feeling of rundown over the past several weeks.  Patient felt he may be dehydrated.  Per family patient has been having difficulty ambulating over the past week.  Patient on admission noted to have borderline blood pressure of 102/67.  Fluids.  Antihypertensive medications were discontinued by patient's cardiologist recently in the outpatient setting and per his cardiologist if no significant improvement in blood pressure 2D echo was needed for further evaluation.  2D echo done 03/14/2018 with concerns for small to moderate pericardial effusion.  Patient placed on IV fluids.  Patient noted to be nauseous.  Antiemetics as needed, IV fluids, supportive care.  Check a KUB.  Consult with cardiology for further evaluation and management due to concerns for pericardial effusion.  No charge.

## 2018-03-14 NOTE — Telephone Encounter (Signed)
-----   Message from Sanda Klein, MD sent at 03/12/2018  1:08 PM EST ----- There is no evidence of bleeding or any change in labs to support dehydration as a cause of his low blood pressure. Need to evaluate for a pericardial effusion with an echocardiogram.  Please schedule for limited echo.

## 2018-03-14 NOTE — Plan of Care (Signed)
Pt is stable. No acute distress.Plan of care reviewed.

## 2018-03-14 NOTE — Progress Notes (Signed)
  Echocardiogram 2D Echocardiogram has been performed.  [name] 03/14/2018, 8:55 AM

## 2018-03-14 NOTE — H&P (Signed)
History and Physical  [name] QIO:[id number] DOB: 01/18/1946 DOA: 03/13/2018 2056  Referring physician: Tyron Russell [facility] ED)  PCP: [name], MD  Outpatient Specialists: Dahlia Byes (oncology); Croitoru M (cardiology)  HISTORY   Chief Complaint: generalized fatigue/weakness   HPI: [name] is a 73 y.o. male  with hx of metastatic clear cell renal cell CA (mets to lung, liver, and peritoneum) on nivolumab (last dose in Nov 2019) who presents with generalized fatigue and loss of appetite. Patient reports he has been feeling "run down" for the past few weeks. States that he thinks he may be "dehydrated." He reports loss of appetite for past several days; he attempts to consume Ensure supplements daily. No alleviating factors or triggers. Patient states that his oncologist is planning to resume nivolumab therapy (which was held since November 2019) given concern for toxicities in the near future (in the next few weeks). Upon chart review, patient has had recurrent episodes of fatigue and loss of appetite, which were attributed to immunotherapy starting last fall. Daughter reports that patient has been "stumbling" and is having difficulty ambulating over the past week. Denies sensory loss in bilateral lower extremities or bladder/bowel incontinence. Recent staging CT chest on 03/04/17 showed new small pericardial effusion.   Review of Systems:  - no fevers/chills - no cough - no chest pain, dyspnea on exertion - no edema, PND, orthopnea - no nausea/vomiting; no tarry, melanotic or bloody stools - no dysuria, increased urinary frequency - no weight changes Rest of systems reviewed are negative, except as per above history.   ED course:  Vitals Blood pressure 102/67, pulse 71, temperature 97.9 F (36.6 C), temperature source Oral, resp. rate 15, SpO2 96 %. Received NS bolus 1L x 1.   Past Medical History:  Diagnosis Date  . Arthritis   . Chronic kidney disease    only has one  kidney   . 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 02/01/2015  . Coronary artery disease    followed by Dr.Tilley  . GERD (gastroesophageal reflux disease)   . Heart murmur   . High cholesterol   . Hx of cancer of lung 1980's  . Hypertension   . Incisional hernia 08/01/2015  . Lung cancer ([place])   . Recurrent umbilical hernia 05/09/4008  . Right renal mass    Past Surgical History:  Procedure Laterality Date  . APPENDECTOMY  age 32  . CHOLECYSTECTOMY  1980's  . LAPAROSCOPIC LYSIS OF ADHESIONS N/A 08/01/2015   Procedure: LAPAROSCOPIC LYSIS OF ADHESIONS;  Surgeon: Michael Boston, MD;  Location: [facility] ORS;  Service: General;  Laterality: N/A;  . LUNG LOBECTOMY  1992   lung cancer- patient has staples in lung not to have MRI per patient   . PILONIDAL CYST EXCISION  2011   Dr Harlow Asa  . ROBOTIC ASSITED PARTIAL NEPHRECTOMY Right 02/01/2015   Procedure: RIGHT ROBOTIC ASSISTED LAPAROSOCOPY NEPHRECTOMY;  Surgeon: Ardis Hughs, MD;  Location: [facility] ORS;  Service: Urology;  Laterality: Right;  . VENTRAL HERNIA REPAIR N/A 08/01/2015   Procedure: LAPAROSCOPIC VENTRAL WALL HERNIA WITH MESH;  Surgeon: Michael Boston, MD;  Location: [facility] ORS;  Service: General;  Laterality: N/A;    Social History:  reports that he quit smoking about 30 years ago. He has never used smokeless tobacco. He reports current alcohol use. He reports that he does not use drugs.  Allergies  Allergen Reactions  . Benadryl [Diphenhydramine] Other (See Comments)    Severe  restless legs    No family history on file.    Prior to Admission medications   Medication Sig Start Date End Date Taking? Authorizing Provider  amLODipine (NORVASC) 5 MG tablet Take 5 mg by mouth daily.   Yes [provider]  aspirin 81 MG chewable tablet Chew 81 mg by mouth daily.   Yes [provider]  ibuprofen ([employer] IBUPROFEN) 200 MG tablet Take 200 mg by mouth every 8 (eight) hours as needed for headache, mild pain  or moderate pain.    Yes [provider]  lisinopril-hydrochlorothiazide (PRINZIDE,ZESTORETIC) 20-25 MG tablet Take 1 tablet by mouth daily.   Yes [provider]  prochlorperazine (COMPAZINE) 10 MG tablet Take 10 mg by mouth every 6 (six) hours as needed for nausea or vomiting.   Yes [provider]    PHYSICAL EXAM   Temp:  [97.9 F (36.6 C)] 97.9 F (36.6 C) (01/26 1942) Pulse Rate:  [71-81] 71 (01/26 2230) Resp:  [15-18] 15 (01/26 2230) BP: (102-118)/(67-68) 102/67 (01/26 2230) SpO2:  [96 %-98 %] 96 % (01/26 2230)  BP 102/67   Pulse 71   Temp 97.9 F (36.6 C) (Oral)   Resp 15   SpO2 96%    GEN well-nourished elderly caucasian male; resting in bed comfortably  HEENT NCAT EOM intact PERRL; clear oropharynx, no cervical LAD; moist mucus membranes  JVP estimated 5 cm H2O above RA; no HJR ; no carotid bruits b/l ;  CV regular normal rate; quiet S1 and S2; no m/r/g or S3/S4; PMI non displaced; negative pulsus paradoxus  RESP CTA b/l; breathing unlabored and symmetric  ABD soft NT ND +normoactive BS  EXT warm throughout b/l; no peripheral edema b/l  PULSES  DP and radials 2+ intact b/l  SKIN/MSK erythematous flat rash over R chest; erythematous slightly raised rash over anterior shins; dry skin NEURO/PSYCH AAOx4; no focal deficits; motor 5/5 throughout   DATA   LABS ON ADMISSION:  Basic Metabolic Panel: Recent Labs  Lab 03/11/18 0913 03/13/18 2013  NA 134 129*  K 4.2 4.0  CL 94* 94*  CO2 21 24  GLUCOSE 89 101*  BUN 17 24*  CREATININE 2.11* 2.36*  CALCIUM 9.8 9.8   CBC: Recent Labs  Lab 03/11/18 0913 03/13/18 2013  WBC 6.6 6.4  NEUTROABS 2.5  --   HGB 13.3 13.1  HCT 40.1 39.5  MCV 86 85.7  PLT 248 254   Liver Function Tests: Recent Labs  Lab 03/11/18 0913  AST 26  ALT 11  ALKPHOS 41  BILITOT 0.5  PROT 6.9  ALBUMIN 4.5   No results for input(s): LIPASE, AMYLASE in the last 168 hours. No results for input(s): AMMONIA in  the last 168 hours. Coagulation:  Lab Results  Component Value Date   INR 0.96 09/07/2017   INR 0.97 11/12/2009   No results found for: PTT Lactic Acid, Venous:  No results found for: LATICACIDVEN Cardiac Enzymes: No results for input(s): CKTOTAL, CKMB, CKMBINDEX, TROPONINI in the last 168 hours. Urinalysis:    Component Value Date/Time   COLORURINE YELLOW 03/13/2018 1952   APPEARANCEUR CLEAR 03/13/2018 1952   LABSPEC 1.021 03/13/2018 1952   PHURINE 5.0 03/13/2018 [place] NEGATIVE 03/13/2018 [place] NEGATIVE 03/13/2018 [place] NEGATIVE 03/13/2018 [place] NEGATIVE 03/13/2018 1952   PROTEINUR NEGATIVE 03/13/2018 1952   UROBILINOGEN 0.2 11/12/2009 1000   NITRITE NEGATIVE 03/13/2018 1952   LEUKOCYTESUR TRACE (A) 03/13/2018  1952    BNP (last 3 results) No results for input(s): PROBNP in the last 8760 hours. CBG: Recent Labs  Lab 03/13/18 2018  GLUCAP 103*    Radiological Exams on Admission: Dg Chest 2 View  Result Date: 03/13/2018 CLINICAL DATA:  Weakness EXAM: CHEST - 2 VIEW COMPARISON:  08/19/2015 FINDINGS: Chronic right pleural and parenchymal scarring with postsurgical changes in the right hilar area. No acute consolidation or effusion. Stable cardiomediastinal silhouette. No pneumothorax. IMPRESSION: No active cardiopulmonary disease. Stable right postsurgical changes Electronically Signed   By: Donavan Foil M.D.   On: 03/13/2018 23:18    EKG: Independently reviewed. NSR, non specific ST- T wave flattening lateral leads; low voltage on limb leads  I have reviewed the patient's previous electronic chart records, labs, and other data.   ASSESSMENT AND PLAN   Assessment: Terrall Bley is a 73 y.o. male with hx of metastatic clear cell renal cell CA (mets to lung, liver, and peritoneum) on nivolumab (last dose in Nov 2019) who presents with generalized fatigue and loss of appetite. These symptoms appear to be recurrent, per chart review,  and seems to have coincided with nivolumab treatments since last year. However, a recent CT chest did show new small pericardial effusion. Hemodynamically stable and no evidence of tamponade on exam. Given the chronicity/recurrence of sx, unlikely that effusion is directly causing his sx, but will obtain full TTE tomorrow. There is a chance for malignant pericardial effusion although pericardial effusion has also been linked to nivolumab therapy. No signs of infection at this time.    Active Problems:   Generalized weakness   Plan:   # Generalized weakness and loss of appetite, recurrent. Unclear etiology but likely multifactorial, related to anti-cancer immunotherapy, cancer itself.  - IV fluids with NS @ 125 cc /hr overnight - ordered home ensure supplement - zofran 4mg  IV prn nausea/vomiting - PO phenergan 10mg  q6h prn mild nausea - PT/OT eval ordered - trial of pepcid 20mg  daily starting tomorrow AM given hx of GERD - consider adding PPI  - prn maalox - may benefit from appetite stimulants  # Hx of CAD - resume home aspirin  # Hx of HTN - currently normotensive > anti-HTN recently discontinued by cardiology  - stop home lisinopril-HCTZ and amlodipine at this time  - discontinue anti-HTN on discharge  # CKD stage 3, Cr 2.36 mildly increased form baseline 2.0-2.2s > in setting of renal CA - fluids as above - repeat BMP in AM  # Metastatic renal CA - followed by outpatient oncology - touch base with inpatient onc team in AM     DVT Prophylaxis: lovenox  Code Status:  Full Code Family Communication: patient and daughter at bedside  Disposition Plan: observation for sx, echo, and PT/OT evaluation   Patient contact: Extended Emergency Contact Information Primary Emergency Contact: Fleischhacker,Nancy Address: [place], [place] [address] [place] Phone: [phone] Relation: Spouse Secondary Emergency Contact: Humble,Jennifer  [place] Phone: [phone] Relation: Daughter  Time spent: > 35 mins  Colbert Ewing, MD [facility] Pager [phone]  If 7PM-7AM, please contact night-coverage [email or web address] Password [email or web address] 03/14/2018, 12:33 AM

## 2018-03-14 NOTE — Evaluation (Signed)
Occupational Therapy Evaluation Patient Details Name: [name] MRN: [id number] DOB: [birth year] Today's Date: 03/14/2018    History of Present Illness 73 y.o. male  with hx of metastatic clear cell renal cell CA (mets to lung, liver, and peritoneum) on nivolumab (last dose in Nov 2019) who presents with generalized fatigue and loss of appetite.   Clinical Impression   Pt admitted with the above.   Pt currently with functional limitations due to the deficits listed below (see OT Problem List).  Pt will benefit from skilled OT to increase their safety and independence with ADL and functional mobility for ADL to facilitate discharge to venue listed below.     Follow Up Recommendations  Home health OT;Supervision - Intermittent    Equipment Recommendations  None recommended by OT    Recommendations for Other Services       Precautions / Restrictions Precautions Precautions: Fall Restrictions Weight Bearing Restrictions: No      Mobility Bed Mobility Overal bed mobility: Needs Assistance Bed Mobility: Supine to Sit;Sit to Supine     Supine to sit: Supervision Sit to supine: Supervision   General bed mobility comments: for safety  Transfers Overall transfer level: Needs assistance Equipment used: None;Rolling walker (2 wheeled) Transfers: Sit to/from Stand Sit to Stand: Min guard         General transfer comment: light assist to steady upon standing    Balance Overall balance assessment: Needs assistance Sitting-balance support: Feet supported;No upper extremity supported Sitting balance-Leahy Scale: Good     Standing balance support: During functional activity;No upper extremity supported Standing balance-Leahy Scale: Fair Standing balance comment: requires assist for balance when wt shifting             High level balance activites: Direction changes;Turns High Level Balance Comments: LOB x3; denies falls           ADL either performed or assessed  with clinical judgement   ADL Overall ADL's : Needs assistance/impaired     Grooming: Standing;Minimal assistance           Upper Body Dressing : Min guard;Sitting   Lower Body Dressing: Sit to/from stand;Cueing for sequencing;Cueing for safety;Supervision/safety   Toilet Transfer: Min guard;Ambulation   Toileting- Clothing Manipulation and Hygiene: Supervision/safety;Sit to/from stand;Cueing for sequencing;Cueing for safety       Functional mobility during ADLs: Supervision/safety;Cueing for safety;Cueing for sequencing;Rolling walker       Vision Patient Visual Report: No change from baseline              Pertinent Vitals/Pain Pain Assessment: No/denies pain     Hand Dominance     Extremity/Trunk Assessment Upper Extremity Assessment Upper Extremity Assessment: Overall WFL for tasks assessed   Lower Extremity Assessment Lower Extremity Assessment: Overall WFL for tasks assessed       Communication Communication Communication: No difficulties   Cognition Arousal/Alertness: Awake/alert Behavior During Therapy: WFL for tasks assessed/performed;Flat affect Overall Cognitive Status: Within Functional Limits for tasks assessed                                 General Comments: verbalizes minimally, responds slowly, pt dtr answers for him frequently d/t delays   General Comments               Home Living Family/patient expects to be discharged to:: Private residence Living Arrangements: Alone Available Help at Discharge: Family;Available PRN/intermittently Type of Home: House Home Access:  Stairs to enter [employer] of Steps: 4   Home Layout: One level     Bathroom Shower/Tub: Teacher, early years/pre: Standard     Home Equipment: Environmental consultant - 2 wheels;Cane - single point          Prior Functioning/Environment Level of Independence: Independent                 OT Problem List: Decreased  strength;Decreased activity tolerance;Decreased safety awareness;Decreased knowledge of use of DME or AE      OT Treatment/Interventions: Self-care/ADL training;Patient/family education;DME and/or AE instruction    OT Goals(Current goals can be found in the care plan section) Acute Rehab OT Goals Patient Stated Goal: home OT Goal Formulation: With patient Time For Goal Achievement: 03/28/18  OT Frequency: Min 2X/week   Barriers to D/C:               AM-PAC OT "6 Clicks" Daily Activity     Outcome Measure Help from another person eating meals?: None Help from another person taking care of personal grooming?: A Little Help from another person toileting, which includes using toliet, bedpan, or urinal?: A Little Help from another person bathing (including washing, rinsing, drying)?: A Little Help from another person to put on and taking off regular upper body clothing?: A Little Help from another person to put on and taking off regular lower body clothing?: A Little 6 Click Score: 19   End of Session Equipment Utilized During Treatment: Rolling walker;Gait belt Nurse Communication: Mobility status  Activity Tolerance: Patient limited by lethargy Patient left: in bed  OT Visit Diagnosis: Unsteadiness on feet (R26.81);Muscle weakness (generalized) (M62.81)                Time: 7703-4035 OT Time Calculation (min): 14 min Charges:  OT General Charges $OT Visit: 1 Visit OT Evaluation $OT Eval Moderate Complexity: 1 Mod  Kari Baars, [place] [phone] Office- [phone], Edwena Felty D 03/14/2018, 4:03 PM

## 2018-03-14 NOTE — Progress Notes (Signed)
Events noted.  [name] hospitalized for weakness, dehydration and hypotension.  Clinically improved with intravenous hydration overnight.  It is unclear if this is related to his malignancy.  He has not received any active treatment to be responsible for this episode.  I recommend continue supportive treatment as you are doing.  I have discussed postponing the restart of nivolumab treatment till March 24, 2018 which is already scheduled for him.  All his questions were answered today to his satisfaction.

## 2018-03-14 NOTE — ED Notes (Signed)
ED TO INPATIENT HANDOFF REPORT  Name/Age/Gender [name] 73 y.o. male  Code Status    Code Status Orders  (From admission, onward)         Start     Ordered   03/13/18 2339  Full code  Continuous     03/13/18 2341        Code Status History    Date Active Date Inactive Code Status Order ID Comments User Context   02/01/2015 1411 02/05/2015 1635 Full Code [id number]  Debbrah Alar, PA-C Inpatient    Advance Directive Documentation     Most Recent Value  Type of Advance Directive  Healthcare Power of Attorney, Living will  Pre-existing out of facility DNR order (yellow form or pink MOST form)  -  "MOST" Form in Place?  -      Home/SNF/Other Home  Chief Complaint Cancer Patient / Vomiting and Weakness and Dehydration   Level of Care/Admitting Diagnosis ED Disposition    ED Disposition Condition Kathleen: [facility] [[id number]]  Level of Care: Med-Surg [16]  Diagnosis: Generalized weakness [376283]  Admitting Physician: Colbert Ewing [[id number]]  Attending Physician: Colbert Ewing [[id number]]  PT Class (Do Not Modify): Observation [104]  PT Acc Code (Do Not Modify): Observation [10022]       Medical History Past Medical History:  Diagnosis Date  . Arthritis   . Chronic kidney disease    only has one kidney   . 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 02/01/2015  . Coronary artery disease    followed by Dr.Tilley  . GERD (gastroesophageal reflux disease)   . Heart murmur   . High cholesterol   . Hx of cancer of lung 1980's  . Hypertension   . Incisional hernia 08/01/2015  . Lung cancer ([place])   . Recurrent umbilical hernia 2/69/4854  . Right renal mass     Allergies Allergies  Allergen Reactions  . Benadryl [Diphenhydramine] Other (See Comments)    Severe restless legs    IV Location/Drains/Wounds Patient Lines/Drains/Airways Status   Active Line/Drains/Airways    Name:    Placement date:   Placement time:   Site:   Days:   Peripheral IV 03/13/18 Right Hand   03/13/18    2208    Hand   1   Incision (Closed) 02/01/15 Abdomen Medial;Anterior;Upper   02/01/15    0826     1137   Incision (Closed) 08/01/15 Abdomen Other (Comment)   08/01/15    0845     956   Incision - 1 Port    -    -        Incision - 3 Ports Abdomen Left;Lateral;Lower 2: Left;Lateral;Mid 3: Left;Lateral;Upper   08/01/15    0805     956   Incision - 5 Ports Abdomen 1: Right;Lateral;Upper 2: Right;Lateral;Upper 3: Right;Lateral;Mid 4: Right;Lateral;Lower 5: Right;Lateral;Lower   02/01/15    0826     1137          Labs/Imaging Results for orders placed or performed during the hospital encounter of 03/13/18 (from the past 48 hour(s))  Urinalysis, Routine w reflex microscopic     Status: Abnormal   Collection Time: 03/13/18  7:52 PM  Result Value Ref Range   Color, Urine YELLOW YELLOW   APPearance CLEAR CLEAR   Specific Gravity, Urine 1.021 1.005 - 1.030   pH 5.0 5.0 - 8.0   Glucose, UA NEGATIVE  NEGATIVE mg/dL   Hgb urine dipstick NEGATIVE NEGATIVE   Bilirubin Urine NEGATIVE NEGATIVE   Ketones, ur NEGATIVE NEGATIVE mg/dL   Protein, ur NEGATIVE NEGATIVE mg/dL   Nitrite NEGATIVE NEGATIVE   Leukocytes, UA TRACE (A) NEGATIVE   RBC / HPF 0-5 0 - 5 RBC/hpf   WBC, UA 0-5 0 - 5 WBC/hpf   Bacteria, UA NONE SEEN NONE SEEN   Squamous Epithelial / LPF 0-5 0 - 5   Mucus PRESENT    Hyaline Casts, UA PRESENT     Comment: Performed at [facility], [place] [address], [place], [place] [address]  Basic metabolic panel     Status: Abnormal   Collection Time: 03/13/18  8:13 PM  Result Value Ref Range   Sodium 129 (L) 135 - 145 mmol/L   Potassium 4.0 3.5 - 5.1 mmol/L   Chloride 94 (L) 98 - 111 mmol/L   CO2 24 22 - 32 mmol/L   Glucose, Bld 101 (H) 70 - 99 mg/dL   BUN 24 (H) 8 - 23 mg/dL   Creatinine, Ser 2.36 (H) 0.61 - 1.24 mg/dL   Calcium 9.8 8.9 - 10.3 mg/dL   GFR calc non Af Amer  26 (L) >60 mL/min   GFR calc Af Amer 30 (L) >60 mL/min   Anion gap 11 5 - 15    Comment: Performed at [facility], [place] [address], [place], [place] [address]  CBC     Status: None   Collection Time: 03/13/18  8:13 PM  Result Value Ref Range   WBC 6.4 4.0 - 10.5 K/uL   RBC 4.61 4.22 - 5.81 MIL/uL   Hemoglobin 13.1 13.0 - 17.0 g/dL   HCT 39.5 39.0 - 52.0 %   MCV 85.7 80.0 - 100.0 fL   MCH 28.4 26.0 - 34.0 pg   MCHC 33.2 30.0 - 36.0 g/dL   RDW 14.3 11.5 - 15.5 %   Platelets 254 150 - 400 K/uL   nRBC 0.0 0.0 - 0.2 %    Comment: Performed at [facility], [place] [address], [place], [place] [address]  CBG monitoring, ED     Status: Abnormal   Collection Time: 03/13/18  8:18 PM  Result Value Ref Range   Glucose-Capillary 103 (H) 70 - 99 mg/dL   Comment 1 Notify RN   I-stat troponin, ED     Status: None   Collection Time: 03/13/18 10:15 PM  Result Value Ref Range   Troponin i, poc 0.00 0.00 - 0.08 ng/mL   Comment 3            Comment: Due to the release kinetics of cTnI, a negative result within the first hours of the onset of symptoms does not rule out myocardial infarction with certainty. If myocardial infarction is still suspected, repeat the test at appropriate intervals.    Dg Chest 2 View  Result Date: 03/13/2018 CLINICAL DATA:  Weakness EXAM: CHEST - 2 VIEW COMPARISON:  08/19/2015 FINDINGS: Chronic right pleural and parenchymal scarring with postsurgical changes in the right hilar area. No acute consolidation or effusion. Stable cardiomediastinal silhouette. No pneumothorax. IMPRESSION: No active cardiopulmonary disease. Stable right postsurgical changes Electronically Signed   By: Donavan Foil M.D.   On: 03/13/2018 23:18   EKG Interpretation  Date/Time:  Sunday March 13 2018 21:58:10 EST Ventricular Rate:  69 PR Interval:    QRS Duration: 116 QT Interval:  399 QTC Calculation: 428 R Axis:   -19  Text Interpretation:  Sinus rhythm  Nonspecific intraventricular conduction delay Low voltage, extremity leads Nonspecific T abnormalities, anterior leads Baseline wander in lead(s) V3 V4 Confirmed by Sherwood Gambler [phone]) on 03/13/2018 10:03:02 PM   Pending Labs Unresulted Labs (From admission, onward)    Start     Ordered   03/15/18 0500  Magnesium  Daily,   R     03/14/18 0041   03/14/18 3358  Basic metabolic panel  Daily,   R     03/14/18 0041   03/14/18 0500  CBC with Differential/Platelet  Daily,   R     03/14/18 0041          Vitals/Pain Today's Vitals   03/13/18 1942 03/13/18 2230 03/14/18 0043  BP: 118/68 102/67 105/68  Pulse: 81 71 70  Resp: 18 15 14   Temp: 97.9 F (36.6 C)    TempSrc: Oral    SpO2: 98% 96% 99%    Isolation Precautions No active isolations  Medications Medications  prochlorperazine (COMPAZINE) tablet 10 mg (has no administration in time range)  enoxaparin (LOVENOX) injection 40 mg (has no administration in time range)  aspirin EC tablet 81 mg (has no administration in time range)  ondansetron (ZOFRAN) injection 4 mg (has no administration in time range)  0.9 %  sodium chloride infusion (has no administration in time range)  feeding supplement (ENSURE ENLIVE) (ENSURE ENLIVE) liquid 237 mL (has no administration in time range)  famotidine (PEPCID) tablet 20 mg (has no administration in time range)  sodium chloride flush (NS) 0.9 % injection 3 mL (3 mLs Intravenous Given 03/13/18 2208)  sodium chloride 0.9 % bolus 1,000 mL (0 mLs Intravenous Stopped 03/14/18 0006)    Mobility non-ambulatory

## 2018-03-15 ENCOUNTER — Ambulatory Visit: Payer: Medicare HMO

## 2018-03-15 ENCOUNTER — Other Ambulatory Visit: Payer: Medicare HMO

## 2018-03-15 ENCOUNTER — Encounter (HOSPITAL_COMMUNITY): Payer: Self-pay | Admitting: Physician Assistant

## 2018-03-15 DIAGNOSIS — I2510 Atherosclerotic heart disease of native coronary artery without angina pectoris: Secondary | ICD-10-CM | POA: Diagnosis not present

## 2018-03-15 DIAGNOSIS — I313 Pericardial effusion (noninflammatory): Secondary | ICD-10-CM

## 2018-03-15 DIAGNOSIS — I10 Essential (primary) hypertension: Secondary | ICD-10-CM | POA: Diagnosis not present

## 2018-03-15 DIAGNOSIS — N183 Chronic kidney disease, stage 3 unspecified: Secondary | ICD-10-CM

## 2018-03-15 DIAGNOSIS — E43 Unspecified severe protein-calorie malnutrition: Secondary | ICD-10-CM | POA: Diagnosis not present

## 2018-03-15 DIAGNOSIS — I3139 Other pericardial effusion (noninflammatory): Secondary | ICD-10-CM

## 2018-03-15 DIAGNOSIS — N179 Acute kidney failure, unspecified: Secondary | ICD-10-CM | POA: Diagnosis not present

## 2018-03-15 DIAGNOSIS — R531 Weakness: Secondary | ICD-10-CM | POA: Diagnosis not present

## 2018-03-15 LAB — BASIC METABOLIC PANEL
Anion gap: 7 (ref 5–15)
BUN: 17 mg/dL (ref 8–23)
CO2: 24 mmol/L (ref 22–32)
Calcium: 8.9 mg/dL (ref 8.9–10.3)
Chloride: 103 mmol/L (ref 98–111)
Creatinine, Ser: 2.16 mg/dL — ABNORMAL HIGH (ref 0.61–1.24)
GFR calc Af Amer: 33 mL/min — ABNORMAL LOW (ref >60)
GFR calc non Af Amer: 29 mL/min — ABNORMAL LOW (ref >60)
GLUCOSE: 88 mg/dL (ref 70–99)
Potassium: 4.3 mmol/L (ref 3.5–5.1)
Sodium: 134 mmol/L — ABNORMAL LOW (ref 135–145)

## 2018-03-15 LAB — CBC WITH DIFFERENTIAL/PLATELET
ABS IMMATURE GRANULOCYTES: 0.01 10*3/uL (ref 0.00–0.07)
Basophils Absolute: 0 10*3/uL (ref 0.0–0.1)
Basophils Relative: 0 %
Eosinophils Absolute: 0.5 10*3/uL (ref 0.0–0.5)
Eosinophils Relative: 10 %
HCT: 37.5 % — ABNORMAL LOW (ref 39.0–52.0)
Hemoglobin: 12 g/dL — ABNORMAL LOW (ref 13.0–17.0)
Immature Granulocytes: 0 %
LYMPHS ABS: 1.9 10*3/uL (ref 0.7–4.0)
Lymphocytes Relative: 38 %
MCH: 28.3 pg (ref 26.0–34.0)
MCHC: 32 g/dL (ref 30.0–36.0)
MCV: 88.4 fL (ref 80.0–100.0)
MONOS PCT: 7 %
Monocytes Absolute: 0.4 10*3/uL (ref 0.1–1.0)
Neutro Abs: 2.3 10*3/uL (ref 1.7–7.7)
Neutrophils Relative %: 45 %
Platelets: 232 10*3/uL (ref 150–400)
RBC: 4.24 MIL/uL (ref 4.22–5.81)
RDW: 14.5 % (ref 11.5–15.5)
WBC: 5.1 10*3/uL (ref 4.0–10.5)
nRBC: 0 % (ref 0.0–0.2)

## 2018-03-15 LAB — MAGNESIUM: Magnesium: 1.9 mg/dL (ref 1.7–2.4)

## 2018-03-15 NOTE — Progress Notes (Signed)
PROGRESS NOTE    Dietrich Samuelson  CWC:[id number] DOB: February 19, 1945 DOA: 03/13/2018 PCP: [name], MD    Brief Narrative: HPI per Dr. Elige Radon is a 73 y.o. male  with hx of metastatic clear cell renal cell CA (mets to lung, liver, and peritoneum) on nivolumab (last dose in Nov 2019) who presented with generalized fatigue and loss of appetite. Patient reported he had been feeling "run down" for the past few weeks. Stated that he thinks he may be "dehydrated." He reports loss of appetite for past several days; he attempts to consume Ensure supplements daily. No alleviating factors or triggers. Patient states that his oncologist is planning to resume nivolumab therapy (which was held since November 2019) given concern for toxicities in the near future (in the next few weeks). Upon chart review, patient has had recurrent episodes of fatigue and loss of appetite, which were attributed to immunotherapy starting last fall.  Patient noted to have had some falls.  Patient's antihypertensive medications recently discontinued per his cardiologist due to borderline blood pressure. Patient admission noted to have a blood pressure 102/67, noted to have some generalized weakness.    Assessment & Plan:   Active Problems:   Generalized weakness   Protein-calorie malnutrition, severe   Pericardial effusion   Essential hypertension   Acute renal failure superimposed on stage 3 chronic kidney disease ([place])  #1 generalized weakness with loss of appetite recurrent/borderline blood pressure Likely multifactorial secondary to immunotherapy, metastatic cancer.  Patient with borderline blood pressure with significant dehydration and significantly poor oral intake.  Patient with bouts of nausea which has been ongoing.  Blood pressure still borderline.  Patient still requiring IV fluid resuscitation which we will continue for now.  Continue antiemetics.  PPI.  PT/OT.  Follow.  2.  History of coronary  artery disease/pericardial effusion Stable.  Home regimen of aspirin resumed.  2D echo was done which was concerning for small and moderate pericardial effusion and EF of 55 to 76%, grade 1 diastolic dysfunction.  Cardiology consulted and patient not noted to be in tamponade at this time.  It is felt by cardiology that patient's pericardial effusion likely malignant in the setting of metastatic cancer however too small at this time for pericardiocentesis.  Cardiology recommending outpatient follow-up with repeat 2D echo in 1 to 2 weeks.  3.  Hypertension Patient noted to have borderline blood pressure.  Patient's antihypertensive medications were discontinued per his cardiologist with recent outpatient follow-up.  We will not resume antihypertensive medications at this time.  4.  Metastatic renal cell cancer Outpatient follow-up with oncology.  5.  Acute renal failure on chronic kidney disease stage III Baseline creatinine 2.0-2.2's.  On admission patient noted to have a creatinine of 2.36 which was felt to be likely secondary to prerenal azotemia due to poor renal perfusion from borderline blood pressure in the setting of ACE inhibitor and diuretics.  ACE inhibitor and diuretics held.  Cr trending down and currently at 2.16.   DVT prophylaxis: Lovenox Code Status: Full Family Communication: Updated patient.  No family at bedside. Disposition Plan: Home once blood pressure improved, no longer requiring IV fluids, with good oral intake.   Consultants:   Cardiology: Dr. Radford Pax 03/15/2018  Procedures:   Abdominal films 03/14/2018  2D echo 03/14/2018  Antimicrobials:   None   Subjective: Patient laying in bed states tolerated breakfast this morning.  Had a bout of nausea this afternoon after drinking his Ensure.  States no significant difference  with his weakness.  Denies any chest pain.  Denies any shortness of breath.  Objective: Vitals:   03/14/18 1443 03/14/18 2102 03/15/18 0539  03/15/18 1329  BP: 115/75 99/60 106/67 112/69  Pulse: 63 71 72 76  Resp: 16 15 14 16   Temp: 97.6 F (36.4 C) 98.2 F (36.8 C) 97.8 F (36.6 C) 98.3 F (36.8 C)  TempSrc:  Oral Oral Oral  SpO2: 95% 95% 92% 94%  Weight:      Height:        Intake/Output Summary (Last 24 hours) at 03/15/2018 1843 Last data filed at 03/15/2018 1751 Gross per 24 hour  Intake 1844.71 ml  Output 2550 ml  Net -705.29 ml   Filed Weights   03/14/18 0109  Weight: 83.7 kg    Examination:  General exam: Appears calm and comfortable  Respiratory system: Clear to auscultation. Respiratory effort normal. Cardiovascular system: S1 & S2 heard, RRR. No JVD, murmurs, rubs, gallops or clicks. No pedal edema. Gastrointestinal system: Abdomen is nondistended, soft and nontender. No organomegaly or masses felt. Normal bowel sounds heard. Central nervous system: Alert and oriented. No focal neurological deficits. Extremities: Symmetric 5 x 5 power. Skin: No rashes, lesions or ulcers Psychiatry: Judgement and insight appear normal. Mood & affect appropriate.     Data Reviewed: I have personally reviewed following labs and imaging studies  CBC: Recent Labs  Lab 03/11/18 0913 03/13/18 2013 03/14/18 0346 03/15/18 0340  WBC 6.6 6.4 5.5 5.1  NEUTROABS 2.5  --  2.3 2.3  HGB 13.3 13.1 12.3* 12.0*  HCT 40.1 39.5 37.4* 37.5*  MCV 86 85.7 88.4 88.4  PLT 248 254 232 161   Basic Metabolic Panel: Recent Labs  Lab 03/11/18 0913 03/13/18 2013 03/14/18 0346 03/15/18 0340  NA 134 129* 131* 134*  K 4.2 4.0 4.1 4.3  CL 94* 94* 97* 103  CO2 21 24 26 24   GLUCOSE 89 101* 95 88  BUN 17 24* 21 17  CREATININE 2.11* 2.36* 2.18* 2.16*  CALCIUM 9.8 9.8 9.2 8.9  MG  --   --   --  1.9   GFR: Estimated Creatinine Clearance: 29.5 mL/min (A) (by C-G formula based on SCr of 2.16 mg/dL (H)). Liver Function Tests: Recent Labs  Lab 03/11/18 0913  AST 26  ALT 11  ALKPHOS 41  BILITOT 0.5  PROT 6.9  ALBUMIN 4.5    No results for input(s): LIPASE, AMYLASE in the last 168 hours. No results for input(s): AMMONIA in the last 168 hours. Coagulation Profile: No results for input(s): INR, PROTIME in the last 168 hours. Cardiac Enzymes: No results for input(s): CKTOTAL, CKMB, CKMBINDEX, TROPONINI in the last 168 hours. BNP (last 3 results) No results for input(s): PROBNP in the last 8760 hours. HbA1C: No results for input(s): HGBA1C in the last 72 hours. CBG: Recent Labs  Lab 03/13/18 2018  GLUCAP 103*   Lipid Profile: No results for input(s): CHOL, HDL, LDLCALC, TRIG, CHOLHDL, LDLDIRECT in the last 72 hours. Thyroid Function Tests: No results for input(s): TSH, T4TOTAL, FREET4, T3FREE, THYROIDAB in the last 72 hours. Anemia Panel: No results for input(s): VITAMINB12, FOLATE, FERRITIN, TIBC, IRON, RETICCTPCT in the last 72 hours. Sepsis Labs: No results for input(s): PROCALCITON, LATICACIDVEN in the last 168 hours.  No results found for this or any previous visit (from the past 240 hour(s)).       Radiology Studies: Dg Chest 2 View  Result Date: 03/13/2018 CLINICAL DATA:  Weakness EXAM: CHEST -  2 VIEW COMPARISON:  08/19/2015 FINDINGS: Chronic right pleural and parenchymal scarring with postsurgical changes in the right hilar area. No acute consolidation or effusion. Stable cardiomediastinal silhouette. No pneumothorax. IMPRESSION: No active cardiopulmonary disease. Stable right postsurgical changes Electronically Signed   By: Donavan Foil M.D.   On: 03/13/2018 23:18   Dg Abd 2 Views  Result Date: 03/14/2018 CLINICAL DATA:  73 year old male with a history of nausea. Known carcinoma EXAM: ABDOMEN - 2 VIEW COMPARISON:  Abdominal CT 03/04/2018 FINDINGS: Gas within stomach, small bowel, colon without abnormal distention. Evidence of diverticula. Dystrophic calcifications of the right abdomen, compatible with findings on prior CT. Cholecystectomy. Changes at the right lung base of hemidiaphragm  elevation and atelectasis/pleural fluid. No acute displaced fracture. IMPRESSION: Nonobstructive bowel gas pattern. Electronically Signed   By: Corrie Mckusick D.O.   On: 03/14/2018 20:13        Scheduled Meds: . aspirin EC  81 mg Oral Daily  . enoxaparin (LOVENOX) injection  30 mg Subcutaneous Q24H  . feeding supplement (ENSURE ENLIVE)  237 mL Oral TID BM  . multivitamin with minerals  1 tablet Oral Daily  . pantoprazole  40 mg Oral Q0600   Continuous Infusions: . sodium chloride 125 mL/hr at 03/15/18 0232     LOS: 0 days    Time spent: 35 mins    Irine Seal, MD [facility]  If 7PM-7AM, please contact night-coverage [email or web address] 03/15/2018, 6:43 PM

## 2018-03-15 NOTE — Consult Note (Signed)
Cardiology Consultation:   Patient ID: [name]; [id number]; Nov 09, 1945   Admit date: 03/13/2018 Date of Consult: 03/15/2018  Primary Care Provider: [name], MD Primary Cardiologist: Sanda Klein, MD 03/11/2018 Primary Electrophysiologist:  None   Patient Profile:   [name] is a 73 y.o. male with a hx of 100% dRCA w/ L>R collaterals, 100% D1, 80% RI, 50% pRCA by cath 2010, nl EF echo 2013, nl MV 05/2016, HTN, HLD, PVCs, renal cell CA s/p RLL nephrectomy w/ metastasis involving the lung and liver, omentum, retrocaval lymph nodes s/p lobectomy on chemo, CKD III-IV w/ Cr 2.11, who is being seen today for the evaluation of pericardial effusion at the request of Dr Regenia Skeeter.  History of Present Illness:   [name] was seen by Dr Sallyanne Kuster 01/24, SBP 80s, lisinopril/HCTZ and amlodipine stopped. Otherwise, no cardiac problems. Small pericardial effusion seen on CT 03/04/2018.   [name] came to the emergency room on 1/26 with generalized weakness and loss of appetite.  He stated he felt run down and felt he might be dehydrated.  There was concern for loss of amplitude on his ECG in the limb leads.  A bedside echo showed a moderate pericardial effusion and he was admitted.  A full echocardiogram was done 1/27, results are below.  It shows a small to moderate pericardial effusion.  There was concern that the pericardial effusion has increased in size, cardiology consult requested.  [name] is feeling better.  He has been on IV fluids with some improvement in his symptoms.    He has not had any chest pain or shortness of breath.    His activity level has not been very high because of his general malaise and weakness, but no problems with dyspnea on exertion.  He denies lower extremity edema, orthopnea or PND.  He has not been lightheaded or dizzy.   Past Medical History:  Diagnosis Date  . Arthritis   . Chronic kidney disease    only has one kidney   . Clear cell renal  cell carcinoma s/p robotic nephrectomy Dec 2016 02/01/2015  . Coronary artery disease    followed by Dr.Tilley  . GERD (gastroesophageal reflux disease)   . Heart murmur   . High cholesterol   . Hx of cancer of lung 1980's  . Hypertension   . Incisional hernia 08/01/2015  . Lung cancer ([place])   . Recurrent umbilical hernia 5/36/1443  . Right renal mass     Past Surgical History:  Procedure Laterality Date  . APPENDECTOMY  age 35  . CHOLECYSTECTOMY  1980's  . LAPAROSCOPIC LYSIS OF ADHESIONS N/A 08/01/2015   Procedure: LAPAROSCOPIC LYSIS OF ADHESIONS;  Surgeon: Michael Boston, MD;  Location: [facility];  Service: General;  Laterality: N/A;  . LUNG LOBECTOMY  1992   lung cancer- patient has staples in lung not to have MRI per patient   . PILONIDAL CYST EXCISION  2011   Dr Harlow Asa  . ROBOTIC ASSITED PARTIAL NEPHRECTOMY Right 02/01/2015   Procedure: RIGHT ROBOTIC ASSISTED LAPAROSOCOPY NEPHRECTOMY;  Surgeon: Ardis Hughs, MD;  Location: [facility];  Service: Urology;  Laterality: Right;  . VENTRAL HERNIA REPAIR N/A 08/01/2015   Procedure: LAPAROSCOPIC VENTRAL WALL HERNIA WITH MESH;  Surgeon: Michael Boston, MD;  Location: [facility];  Service: General;  Laterality: N/A;     Prior to Admission medications   Medication Sig Start Date End Date Taking? Authorizing Provider  amLODipine (NORVASC) 5 MG tablet Take 5 mg by mouth  daily.   Yes [provider]  aspirin 81 MG chewable tablet Chew 81 mg by mouth daily.   Yes [provider]  ibuprofen ([employer] IBUPROFEN) 200 MG tablet Take 200 mg by mouth every 8 (eight) hours as needed for headache, mild pain or moderate pain.    Yes [provider]  lisinopril-hydrochlorothiazide (PRINZIDE,ZESTORETIC) 20-25 MG tablet Take 1 tablet by mouth daily.   Yes [provider]  prochlorperazine (COMPAZINE) 10 MG tablet Take 10 mg by mouth every 6 (six) hours as needed for nausea or vomiting.   Yes [provider]    Inpatient  Medications: Scheduled Meds: . aspirin EC  81 mg Oral Daily  . enoxaparin (LOVENOX) injection  30 mg Subcutaneous Q24H  . feeding supplement (ENSURE ENLIVE)  237 mL Oral TID BM  . multivitamin with minerals  1 tablet Oral Daily  . pantoprazole  40 mg Oral Q0600   Continuous Infusions: . sodium chloride 125 mL/hr at 03/15/18 0232   PRN Meds: acetaminophen, alum & mag hydroxide-simeth, HYDROcodone-acetaminophen, ondansetron (ZOFRAN) IV, prochlorperazine  Allergies:    Allergies  Allergen Reactions  . Benadryl [Diphenhydramine] Other (See Comments)    Severe restless legs    Social History:   Social History   Socioeconomic History  . Marital status: Married    Spouse name: Not on file  . Number of children: Not on file  . Years of education: Not on file  . Highest education level: Not on file  Occupational History  . Occupation: Retired  Scientific laboratory technician  . Financial resource strain: Not on file  . Food insecurity:    Worry: Not on file    Inability: Not on file  . Transportation needs:    Medical: Not on file    Non-medical: Not on file  Tobacco Use  . Smoking status: Former Smoker    Packs/day: 1.00    Years: 20.00    Pack years: 20.00    Last attempt to quit: 01/29/1988    Years since quitting: 30.1  . Smokeless tobacco: Never Used  Substance and Sexual Activity  . Alcohol use: Yes    Comment: rare  . Drug use: No  . Sexual activity: Not on file  Lifestyle  . Physical activity:    Days per week: Not on file    Minutes per session: Not on file  . Stress: Not on file  Relationships  . Social connections:    Talks on phone: Not on file    Gets together: Not on file    Attends religious service: Not on file    Active member of club or organization: Not on file    Attends meetings of clubs or organizations: Not on file    Relationship status: Not on file  . Intimate partner violence:    Fear of current or ex partner: Not on file    Emotionally abused: Not on  file    Physically abused: Not on file    Forced sexual activity: Not on file  Other Topics Concern  . Not on file  Social History Narrative  . Not on file    Family History:   Family History  Problem Relation Age of Onset  . Heart attack Father    Family Status:  Family Status  Relation Name Status  . Mother  Deceased  . Father  Deceased    ROS:  Please see the history of present illness.  All other ROS reviewed and negative.  Physical Exam/Data:   Vitals:   03/14/18 0530 03/14/18 1443 03/14/18 2102 03/15/18 0539  BP: 109/64 115/75 99/60 106/67  Pulse: 72 63 71 72  Resp: 12 16 15 14   Temp: (!) 97.5 F (36.4 C) 97.6 F (36.4 C) 98.2 F (36.8 C) 97.8 F (36.6 C)  TempSrc:   Oral Oral  SpO2: 96% 95% 95% 92%  Weight:      Height:        Intake/Output Summary (Last 24 hours) at 03/15/2018 0823 Last data filed at 03/15/2018 0549 Gross per 24 hour  Intake 3227.83 ml  Output 3125 ml  Net 102.83 ml   Filed Weights   03/14/18 0109  Weight: 83.7 kg   Body mass index is 27.25 kg/m.  General:  Well nourished, well developed, in no acute distress HEENT: normal Lymph: no adenopathy Neck: no JVD Endocrine:  No thryomegaly Vascular: No carotid bruits; 4/4 extremity pulses 2+, without bruits  Cardiac:  normal S1, S2; RRR; no murmur  Lungs:  clear to auscultation bilaterally, no wheezing, rhonchi or rales  Abd: soft, nontender, no hepatomegaly  Ext: no edema Musculoskeletal:  No deformities, BUE and BLE strength normal and equal Skin: warm and dry  Neuro:  CNs 2-12 intact, no focal abnormalities noted Psych:  Normal affect   EKG:  The EKG was personally reviewed and demonstrates: Sinus rhythm, decreased amplitude in the limb leads, especially the inferior leads.  This does not appear significantly changed from the ECG 03/11/2018, but is different from 10/2014. Telemetry:  Telemetry was personally reviewed and demonstrates: He is not on telemetry  Relevant CV  Studies:  ECHO: - Left ventricle: The cavity size was normal. Systolic function was   normal. The estimated ejection fraction was in the range of 55%   to 60%. Wall motion was normal; there were no regional wall   motion abnormalities. Doppler parameters are consistent with   abnormal left ventricular relaxation (grade 1 diastolic   dysfunction). - Pericardium, extracardiac: A small to moderate pericardial   effusion was identified circumferential to the heart.  CATH: 06/27/2008  ANGIOGRAPHIC DATA:  Left ventriculogram:  Performed in the 30 degrees  RAO projection.  The aortic valve is normal.  The mitral valve is  normal.  The left ventricle appears normal in size.  Estimated ejection  fraction is 65-70%.  Coronary arteries arise and distribute normally.  There is calcification noted involving all three vessels.  It is mild to  moderate in nature.  Left main coronary artery is normal.  Left anterior  descending has either an occluded first diagonal branch or an ulcerated  plaque in the proximal portion of the vessel prior to the septal  perforator.  Very little in the way of collaterals is seen.  There is a  mild diffuse disease present and a 30% mid stenosis is noted.   CIRCUMFLEX CORONARY ARTERY:  An intermediate branch has a long segmental  80% stenosis and is somewhat small in portion.  The circumflex has a  first marginal branch which has a segmental 50% stenosis which is  segmental in its proximal portion.  Collateral filling is seen of the  distal right coronary artery with bidirectional flow noted.  The right  coronary artery is calcified.  There is a long severe segmental stenosis  of 95-99% in the proximal to the midportion of the vessel with was some  evidence of bridging collaterals, but forward antegrade flow was noted.   IMPRESSION:  Coronary artery disease  with subtotal occlusion of the  right coronary artery with bridging collaterals and left-to-right   collaterals, diffuse disease involving the proximal LAD, first marginal  branch intermediate branch.   RECOMMENDATIONS:  Initial intensive medical therapy at this time.  Consultation with interventional cardiologist to determine if PCI an  option, but angiographically appears to not be a good candidate for  this.   Laboratory Data:  Chemistry Recent Labs  Lab 03/13/18 2013 03/14/18 0346 03/15/18 0340  NA 129* 131* 134*  K 4.0 4.1 4.3  CL 94* 97* 103  CO2 24 26 24   GLUCOSE 101* 95 88  BUN 24* 21 17  CREATININE 2.36* 2.18* 2.16*  CALCIUM 9.8 9.2 8.9  GFRNONAA 26* 29* 29*  GFRAA 30* 33* 33*  ANIONGAP 11 8 7     Lab Results  Component Value Date   ALT 11 03/11/2018   AST 26 03/11/2018   ALKPHOS 41 03/11/2018   BILITOT 0.5 03/11/2018   Hematology Recent Labs  Lab 03/13/18 2013 03/14/18 0346 03/15/18 0340  WBC 6.4 5.5 5.1  RBC 4.61 4.23 4.24  HGB 13.1 12.3* 12.0*  HCT 39.5 37.4* 37.5*  MCV 85.7 88.4 88.4  MCH 28.4 29.1 28.3  MCHC 33.2 32.9 32.0  RDW 14.3 14.5 14.5  PLT 254 232 232   Cardiac EnzymesNo results for input(s): TROPONINI in the last 168 hours.  Recent Labs  Lab 03/13/18 2215  TROPIPOC 0.00     TSH:  Lab Results  Component Value Date   TSH 0.312 (L) 01/06/2018   Lipids:No results found for: CHOL, HDL, LDLCALC, LDLDIRECT, TRIG, CHOLHDL HgbA1c:No results found for: HGBA1C  Magnesium:  Magnesium  Date Value Ref Range Status  03/15/2018 1.9 1.7 - 2.4 mg/dL Final    Comment:    Performed at [facility], [place] [address] [place], [place] [address]     Radiology/Studies:  Ct Abdomen Pelvis Wo Contrast  Result Date: 03/04/2018 CLINICAL DATA:  Right renal cell carcinoma metastatic to the lungs. Ongoing chemotherapy. Remote history of right lung cancer. EXAM: CT CHEST, ABDOMEN AND PELVIS WITHOUT CONTRAST TECHNIQUE: Multidetector CT imaging of the chest, abdomen and pelvis was performed following the standard protocol without  IV contrast. COMPARISON:  12/14/2017 FINDINGS: CT CHEST FINDINGS Cardiovascular: Ascending thoracic aortic ectasia at 3.9 cm. Coronary, aortic arch, and branch vessel atherosclerotic vascular disease. New small pericardial effusion. Mediastinum/Nodes: Postoperative findings from prior right lobectomy. Anterior pericardial node 0.7 cm in short axis on image 42/2, formerly 0.6 cm. Lungs/Pleura: Right lower lobectomy and likely right middle lobectomy in the past. Left lower lobe nodule 0.8 by 0.8 cm, formerly the same. Stable 0.3 cm left lower lobe nodule on image 105/6. Musculoskeletal: Old right rib deformities from prior fractures. Chondrocalcinosis. Mild thoracic spondylosis. CT ABDOMEN PELVIS FINDINGS Hepatobiliary: Chronic 5.0 by 3.7 cm hypodense lesion along the anterior margin of the left hepatic lobe. No new liver lesion is appreciated on today's noncontrast exam. Prior cholecystectomy. Prior lesion posteriorly in segment 6 of the liver is only faintly seen and appears roughly similar to prior exam, about 1.1 by 0.7 cm. Pancreas: Unremarkable pancreatic contour. Spleen: Unremarkable Adrenals/Urinary Tract: Right nephrectomy with ring density around adipose tissue along the resection site, similar to prior and probably from fat packing. Adrenal glands normal. Left kidney appears unremarkable. No urinary tract calculi. Stomach/Bowel: Considerable sigmoid colon diverticulosis with scattered diverticula in the descending colon. Vascular/Lymphatic: Aortoiliac atherosclerotic vascular disease. Peripancreatic node borderline enlarged at 1.0 cm in short axis, formerly 0.9  cm. Retrocaval lymph node previously measuring 1.1 cm in short axis currently measures 0.7 cm in short axis. Reproductive: Prostatomegaly. Other: The left omental tumor implant measures 1.6 by 0.8 cm, formerly 1.9 by 1.2 cm. Musculoskeletal: Grade 1 anterolisthesis at L4-5. Lumbar spondylosis and degenerative disc disease causing bilateral foraminal  impingement at L4-5 and L5-S1. There is likely a synovial cyst or disc protrusion in the right neural foramen at L5-S1 containing nitrogen. IMPRESSION: 1. General improvement, with reduction in size of the left omental tumor implants and of the retrocaval lymph node compared to the prior exam. The 8 mm nodule in the left lower lobe is stable. The small metastatic lesion posteriorly in segment 6 of the liver is only faintly seen on today's exam, and mildly improved in size. No new metastatic lesions are identified. 2. New small pericardial effusion, significance uncertain. 3. Other imaging findings of potential clinical significance: Ascending aortic ectasia. Aortic Atherosclerosis (ICD10-I70.0). Coronary atherosclerosis. Chondrocalcinosis raising the possibility of CPPD arthropathy. Prior right nephrectomy. Sigmoid diverticulosis. Prostatomegaly. Lower lumbar impingement at L4-5 at L5-S1. Electronically Signed   By: Van Clines M.D.   On: 03/04/2018 10:33   Dg Chest 2 View  Result Date: 03/13/2018 CLINICAL DATA:  Weakness EXAM: CHEST - 2 VIEW COMPARISON:  08/19/2015 FINDINGS: Chronic right pleural and parenchymal scarring with postsurgical changes in the right hilar area. No acute consolidation or effusion. Stable cardiomediastinal silhouette. No pneumothorax. IMPRESSION: No active cardiopulmonary disease. Stable right postsurgical changes Electronically Signed   By: Donavan Foil M.D.   On: 03/13/2018 23:18   Ct Chest Wo Contrast  Result Date: 03/04/2018 CLINICAL DATA:  Right renal cell carcinoma metastatic to the lungs. Ongoing chemotherapy. Remote history of right lung cancer. EXAM: CT CHEST, ABDOMEN AND PELVIS WITHOUT CONTRAST TECHNIQUE: Multidetector CT imaging of the chest, abdomen and pelvis was performed following the standard protocol without IV contrast. COMPARISON:  12/14/2017 FINDINGS: CT CHEST FINDINGS Cardiovascular: Ascending thoracic aortic ectasia at 3.9 cm. Coronary, aortic arch, and  branch vessel atherosclerotic vascular disease. New small pericardial effusion. Mediastinum/Nodes: Postoperative findings from prior right lobectomy. Anterior pericardial node 0.7 cm in short axis on image 42/2, formerly 0.6 cm. Lungs/Pleura: Right lower lobectomy and likely right middle lobectomy in the past. Left lower lobe nodule 0.8 by 0.8 cm, formerly the same. Stable 0.3 cm left lower lobe nodule on image 105/6. Musculoskeletal: Old right rib deformities from prior fractures. Chondrocalcinosis. Mild thoracic spondylosis. CT ABDOMEN PELVIS FINDINGS Hepatobiliary: Chronic 5.0 by 3.7 cm hypodense lesion along the anterior margin of the left hepatic lobe. No new liver lesion is appreciated on today's noncontrast exam. Prior cholecystectomy. Prior lesion posteriorly in segment 6 of the liver is only faintly seen and appears roughly similar to prior exam, about 1.1 by 0.7 cm. Pancreas: Unremarkable pancreatic contour. Spleen: Unremarkable Adrenals/Urinary Tract: Right nephrectomy with ring density around adipose tissue along the resection site, similar to prior and probably from fat packing. Adrenal glands normal. Left kidney appears unremarkable. No urinary tract calculi. Stomach/Bowel: Considerable sigmoid colon diverticulosis with scattered diverticula in the descending colon. Vascular/Lymphatic: Aortoiliac atherosclerotic vascular disease. Peripancreatic node borderline enlarged at 1.0 cm in short axis, formerly 0.9 cm. Retrocaval lymph node previously measuring 1.1 cm in short axis currently measures 0.7 cm in short axis. Reproductive: Prostatomegaly. Other: The left omental tumor implant measures 1.6 by 0.8 cm, formerly 1.9 by 1.2 cm. Musculoskeletal: Grade 1 anterolisthesis at L4-5. Lumbar spondylosis and degenerative disc disease causing bilateral foraminal impingement at L4-5 and L5-S1.  There is likely a synovial cyst or disc protrusion in the right neural foramen at L5-S1 containing nitrogen. IMPRESSION:  1. General improvement, with reduction in size of the left omental tumor implants and of the retrocaval lymph node compared to the prior exam. The 8 mm nodule in the left lower lobe is stable. The small metastatic lesion posteriorly in segment 6 of the liver is only faintly seen on today's exam, and mildly improved in size. No new metastatic lesions are identified. 2. New small pericardial effusion, significance uncertain. 3. Other imaging findings of potential clinical significance: Ascending aortic ectasia. Aortic Atherosclerosis (ICD10-I70.0). Coronary atherosclerosis. Chondrocalcinosis raising the possibility of CPPD arthropathy. Prior right nephrectomy. Sigmoid diverticulosis. Prostatomegaly. Lower lumbar impingement at L4-5 at L5-S1. Electronically Signed   By: Van Clines M.D.   On: 03/04/2018 10:33   Dg Abd 2 Views  Result Date: 03/14/2018 CLINICAL DATA:  73 year old male with a history of nausea. Known carcinoma EXAM: ABDOMEN - 2 VIEW COMPARISON:  Abdominal CT 03/04/2018 FINDINGS: Gas within stomach, small bowel, colon without abnormal distention. Evidence of diverticula. Dystrophic calcifications of the right abdomen, compatible with findings on prior CT. Cholecystectomy. Changes at the right lung base of hemidiaphragm elevation and atelectasis/pleural fluid. No acute displaced fracture. IMPRESSION: Nonobstructive bowel gas pattern. Electronically Signed   By: Corrie Mckusick D.O.   On: 03/14/2018 20:13     Assessment and Plan:   1.  Pericardial effusion: - Dr. Radford Pax to review and advise if the effusion is significantly increased from 1/17 CT (if the images are comparable) - At this time, he is not in tamponade - The most likely etiology for the effusion is malignant - Discuss with Dr. Radford Pax if we need a tap for drainage and etiology -If no further work-up is indicated, recheck echo prior to his follow-up appointment, 04/22/2018 with Almyra Deforest, PA-C  Otherwise, per IM.  Follow-up with  cardiology as scheduled Active Problems:   Generalized weakness   Protein-calorie malnutrition, severe   For questions or updates, please contact [facility] Please consult [email or web address] for contact info under Cardiology/STEMI.   Jonetta Speak, PA-C  03/15/2018 8:23 AM

## 2018-03-15 NOTE — Progress Notes (Signed)
error 

## 2018-03-16 DIAGNOSIS — R531 Weakness: Secondary | ICD-10-CM | POA: Diagnosis not present

## 2018-03-16 DIAGNOSIS — R110 Nausea: Secondary | ICD-10-CM

## 2018-03-16 DIAGNOSIS — N170 Acute kidney failure with tubular necrosis: Secondary | ICD-10-CM | POA: Diagnosis not present

## 2018-03-16 DIAGNOSIS — N183 Chronic kidney disease, stage 3 (moderate): Secondary | ICD-10-CM | POA: Diagnosis not present

## 2018-03-16 DIAGNOSIS — I10 Essential (primary) hypertension: Secondary | ICD-10-CM | POA: Diagnosis not present

## 2018-03-16 LAB — CBC WITH DIFFERENTIAL/PLATELET
ABS IMMATURE GRANULOCYTES: 0.01 10*3/uL (ref 0.00–0.07)
Basophils Absolute: 0 10*3/uL (ref 0.0–0.1)
Basophils Relative: 0 %
EOS ABS: 0.5 10*3/uL (ref 0.0–0.5)
Eosinophils Relative: 9 %
HCT: 36.1 % — ABNORMAL LOW (ref 39.0–52.0)
Hemoglobin: 11.5 g/dL — ABNORMAL LOW (ref 13.0–17.0)
IMMATURE GRANULOCYTES: 0 %
Lymphocytes Relative: 35 %
Lymphs Abs: 2 10*3/uL (ref 0.7–4.0)
MCH: 29 pg (ref 26.0–34.0)
MCHC: 31.9 g/dL (ref 30.0–36.0)
MCV: 91.2 fL (ref 80.0–100.0)
Monocytes Absolute: 0.5 10*3/uL (ref 0.1–1.0)
Monocytes Relative: 8 %
NEUTROS PCT: 48 %
Neutro Abs: 2.6 10*3/uL (ref 1.7–7.7)
PLATELETS: 211 10*3/uL (ref 150–400)
RBC: 3.96 MIL/uL — ABNORMAL LOW (ref 4.22–5.81)
RDW: 14.5 % (ref 11.5–15.5)
WBC: 5.6 10*3/uL (ref 4.0–10.5)
nRBC: 0 % (ref 0.0–0.2)

## 2018-03-16 LAB — BASIC METABOLIC PANEL
Anion gap: 6 (ref 5–15)
BUN: 17 mg/dL (ref 8–23)
CO2: 25 mmol/L (ref 22–32)
Calcium: 8.9 mg/dL (ref 8.9–10.3)
Chloride: 105 mmol/L (ref 98–111)
Creatinine, Ser: 2.15 mg/dL — ABNORMAL HIGH (ref 0.61–1.24)
GFR calc Af Amer: 34 mL/min — ABNORMAL LOW (ref >60)
GFR calc non Af Amer: 29 mL/min — ABNORMAL LOW (ref >60)
GLUCOSE: 101 mg/dL — AB (ref 70–99)
Potassium: 4.2 mmol/L (ref 3.5–5.1)
Sodium: 136 mmol/L (ref 135–145)

## 2018-03-16 LAB — MAGNESIUM: Magnesium: 1.9 mg/dL (ref 1.7–2.4)

## 2018-03-16 MED ORDER — PROCHLORPERAZINE MALEATE 10 MG PO TABS: 10.0000 mg | ORAL_TABLET | Freq: Four times a day (QID) | ORAL | 0 refills | Status: DC | PRN

## 2018-03-16 MED ORDER — ACETAMINOPHEN 325 MG PO TABS: 650.0000 mg | ORAL_TABLET | Freq: Four times a day (QID) | ORAL | 3 refills | Status: DC | PRN

## 2018-03-16 MED ORDER — PANTOPRAZOLE SODIUM 40 MG PO TBEC: 40.0000 mg | DELAYED_RELEASE_TABLET | Freq: Every day | ORAL | 3 refills | Status: DC

## 2018-03-16 MED ORDER — ADULT MULTIVITAMIN W/MINERALS CH: 1.0000 | ORAL_TABLET | Freq: Every day | ORAL | 3 refills | Status: DC

## 2018-03-16 NOTE — Discharge Summary (Signed)
Physician Discharge Summary   Patient ID: [name] MRN: [id number] DOB/AGE: December 04, 1945 73 y.o.  Admit date: 03/13/2018 Discharge date: 03/16/2018  Primary Care Physician:  Alex Downing, MD   Recommendations for Outpatient Follow-up:  1. Follow up with PCP in 1-2 weeks 2. Please obtain bmet at the follow-up appointment 3. Patient recommended to stop all his antihypertensives 4. Patient will need repeat 2D echo in 1 to 2 weeks to follow-up on the pericardial effusion  Home Health: PT OT, RN, aide Equipment/Devices: Rolling walker  Discharge Condition: stable  CODE STATUS: FULL  Diet recommendation: Regular diet   Discharge Diagnoses:    Generalized weakness with failure to thrive Acute kidney injury superimposed on CKD stage III Hypotension with history of hypertension History of CAD Pericardial effusion Metastatic renal cancer  Consults: Cardiology    Allergies:   Allergies  Allergen Reactions  . Benadryl [Diphenhydramine] Other (See Comments)    Severe restless legs     DISCHARGE MEDICATIONS: Allergies as of 03/16/2018      Reactions   Benadryl [diphenhydramine] Other (See Comments)   Severe restless legs      Medication List    STOP taking these medications   amLODipine 5 MG tablet Commonly known as:  NORVASC   [facility] IBUPROFEN 200 MG tablet Generic drug:  ibuprofen   lisinopril-hydrochlorothiazide 20-25 MG tablet Commonly known as:  PRINZIDE,ZESTORETIC     TAKE these medications   acetaminophen 325 MG tablet Commonly known as:  TYLENOL Take 2 tablets (650 mg total) by mouth every 6 (six) hours as needed for mild pain or headache (over the counter).   aspirin 81 MG chewable tablet Chew 81 mg by mouth daily.   multivitamin with minerals Tabs tablet Take 1 tablet by mouth daily.   pantoprazole 40 MG tablet Commonly known as:  PROTONIX Take 1 tablet (40 mg total) by mouth daily at 6 (six) AM.   prochlorperazine 10 MG  tablet Commonly known as:  COMPAZINE Take 1 tablet (10 mg total) by mouth every 6 (six) hours as needed for nausea or vomiting.            Durable Medical Equipment  (From admission, onward)         Start     Ordered   03/16/18 1108  For home use only DME Walker rolling  Once    Question:  Patient needs a walker to treat with the following condition  Answer:  Gait instability   03/16/18 1107           Brief H and P: For complete details please refer to admission H and P, but in brief [name] a 73 y.o.malewith hx of metastatic clear cell renal cell CA (mets to lung, liver, and peritoneum) on nivolumab (last dose in Nov 2019) who presented with generalized fatigue and loss of appetite. Patient reported he had been feeling "run down" for the past few weeks. Stated that he thinks he may be "dehydrated." He reports loss of appetite for past several days; he attempts to consume Ensure supplements daily. No alleviating factors or triggers. Patient states that his oncologist is planning to resume nivolumab therapy (which was held since November 2019) given concern for toxicities in the near future (in the next few weeks). Upon chart review, patient has had recurrent episodes of fatigue and loss of appetite, which were attributed to immunotherapy starting last fall.  Patient noted to have had some falls.  Patient's antihypertensive medications recently discontinued per his  cardiologist due to borderline blood pressure. Patient admission noted to have a blood pressure 102/67, noted to have some generalized weakness.  Hospital Course:     Generalized weakness, with failure to thrive -Likely multifactorial secondary to immunotherapy, metastatic cancer, patient presented with borderline BP with significant dehydration due to poor oral intake at the times of admission.  He also had bouts of nausea which had been ongoing. -Patient was placed on IV fluid resuscitation, PT OT recommended home  health PT    Protein-calorie malnutrition, severe -Patient recommended nutritional supplements of daily    Pericardial effusion with underlying history of coronary disease- Continue aspirin.  2D echo showed EF of 55 to 62%, grade 1 diastolic dysfunction, small to moderate pericardial effusion. -Cardiology was consulted, felt that patient's pericardial effusion likely malignant in the setting of metastatic cancer however too small at this time for pericardiocentesis. -Cardiology recommended outpatient follow-up with repeat 2D echo in 1 to 2 weeks    Hypotension with history of essential hypertension -Patient noted to have borderline BP, his antihypertensive medications were discontinued by his cardiologist with recent follow-up. -Patient has been recommended to continue to hold his antihypertensives, follow outpatient with his cardiologist    Acute renal failure superimposed on stage 3 chronic kidney disease ([name]) -Baseline creatinine 2-2.2 On admission, patient noted to have creatinine of 2.36 likely due to prerenal azotemia secondary to poor renal perfusion from borderline BP in the setting of ACE inhibitor and diuretics, poor oral intake -Antihypertensives were held, patient was resuscitated with IV fluids, creatinine back to baseline 2.1  Metastatic renal cancer Outpatient follow-up with oncology  Day of Discharge S: Denies any new complaints, tolerated breakfast, hoping to go home  BP 128/69 (BP Location: Left Arm)   Pulse 73   Temp 98.2 F (36.8 C) (Oral)   Resp 14   Ht 5\' 9"  (1.753 m)   Wt 83.7 kg   SpO2 94%   BMI 27.25 kg/m   Physical Exam: General: Alert and awake oriented x3 not in any acute distress. HEENT: anicteric sclera, pupils reactive to light and accommodation CVS: S1-S2 clear no murmur rubs or gallops Chest: clear to auscultation bilaterally, no wheezing rales or rhonchi Abdomen: soft nontender, nondistended, normal bowel sounds Extremities: no cyanosis,  clubbing or edema noted bilaterally Neuro: Cranial nerves II-XII intact, no focal neurological deficits   The results of significant diagnostics from this hospitalization (including imaging, microbiology, ancillary and laboratory) are listed below for reference.      Procedures/Studies:  2D echo Study Conclusions  - Left ventricle: The cavity size was normal. Systolic function was   normal. The estimated ejection fraction was in the range of 55%   to 60%. Wall motion was normal; there were no regional wall   motion abnormalities. Doppler parameters are consistent with   abnormal left ventricular relaxation (grade 1 diastolic   dysfunction). - Pericardium, extracardiac: A small to moderate pericardial   effusion was identified circumferential to the heart.   Ct Abdomen Pelvis Wo Contrast  Result Date: 03/04/2018 CLINICAL DATA:  Right renal cell carcinoma metastatic to the lungs. Ongoing chemotherapy. Remote history of right lung cancer. EXAM: CT CHEST, ABDOMEN AND PELVIS WITHOUT CONTRAST TECHNIQUE: Multidetector CT imaging of the chest, abdomen and pelvis was performed following the standard protocol without IV contrast. COMPARISON:  12/14/2017 FINDINGS: CT CHEST FINDINGS Cardiovascular: Ascending thoracic aortic ectasia at 3.9 cm. Coronary, aortic arch, and branch vessel atherosclerotic vascular disease. New small pericardial effusion. Mediastinum/Nodes: Postoperative findings from  prior right lobectomy. Anterior pericardial node 0.7 cm in short axis on image 42/2, formerly 0.6 cm. Lungs/Pleura: Right lower lobectomy and likely right middle lobectomy in the past. Left lower lobe nodule 0.8 by 0.8 cm, formerly the same. Stable 0.3 cm left lower lobe nodule on image 105/6. Musculoskeletal: Old right rib deformities from prior fractures. Chondrocalcinosis. Mild thoracic spondylosis. CT ABDOMEN PELVIS FINDINGS Hepatobiliary: Chronic 5.0 by 3.7 cm hypodense lesion along the anterior margin of  the left hepatic lobe. No new liver lesion is appreciated on today's noncontrast exam. Prior cholecystectomy. Prior lesion posteriorly in segment 6 of the liver is only faintly seen and appears roughly similar to prior exam, about 1.1 by 0.7 cm. Pancreas: Unremarkable pancreatic contour. Spleen: Unremarkable Adrenals/Urinary Tract: Right nephrectomy with ring density around adipose tissue along the resection site, similar to prior and probably from fat packing. Adrenal glands normal. Left kidney appears unremarkable. No urinary tract calculi. Stomach/Bowel: Considerable sigmoid colon diverticulosis with scattered diverticula in the descending colon. Vascular/Lymphatic: Aortoiliac atherosclerotic vascular disease. Peripancreatic node borderline enlarged at 1.0 cm in short axis, formerly 0.9 cm. Retrocaval lymph node previously measuring 1.1 cm in short axis currently measures 0.7 cm in short axis. Reproductive: Prostatomegaly. Other: The left omental tumor implant measures 1.6 by 0.8 cm, formerly 1.9 by 1.2 cm. Musculoskeletal: Grade 1 anterolisthesis at L4-5. Lumbar spondylosis and degenerative disc disease causing bilateral foraminal impingement at L4-5 and L5-S1. There is likely a synovial cyst or disc protrusion in the right neural foramen at L5-S1 containing nitrogen. IMPRESSION: 1. General improvement, with reduction in size of the left omental tumor implants and of the retrocaval lymph node compared to the prior exam. The 8 mm nodule in the left lower lobe is stable. The small metastatic lesion posteriorly in segment 6 of the liver is only faintly seen on today's exam, and mildly improved in size. No new metastatic lesions are identified. 2. New small pericardial effusion, significance uncertain. 3. Other imaging findings of potential clinical significance: Ascending aortic ectasia. Aortic Atherosclerosis (ICD10-I70.0). Coronary atherosclerosis. Chondrocalcinosis raising the possibility of CPPD arthropathy.  Prior right nephrectomy. Sigmoid diverticulosis. Prostatomegaly. Lower lumbar impingement at L4-5 at L5-S1. Electronically Signed   By: Van Clines M.D.   On: 03/04/2018 10:33   Dg Chest 2 View  Result Date: 03/13/2018 CLINICAL DATA:  Weakness EXAM: CHEST - 2 VIEW COMPARISON:  08/19/2015 FINDINGS: Chronic right pleural and parenchymal scarring with postsurgical changes in the right hilar area. No acute consolidation or effusion. Stable cardiomediastinal silhouette. No pneumothorax. IMPRESSION: No active cardiopulmonary disease. Stable right postsurgical changes Electronically Signed   By: Donavan Foil M.D.   On: 03/13/2018 23:18   Ct Chest Wo Contrast  Result Date: 03/04/2018 CLINICAL DATA:  Right renal cell carcinoma metastatic to the lungs. Ongoing chemotherapy. Remote history of right lung cancer. EXAM: CT CHEST, ABDOMEN AND PELVIS WITHOUT CONTRAST TECHNIQUE: Multidetector CT imaging of the chest, abdomen and pelvis was performed following the standard protocol without IV contrast. COMPARISON:  12/14/2017 FINDINGS: CT CHEST FINDINGS Cardiovascular: Ascending thoracic aortic ectasia at 3.9 cm. Coronary, aortic arch, and branch vessel atherosclerotic vascular disease. New small pericardial effusion. Mediastinum/Nodes: Postoperative findings from prior right lobectomy. Anterior pericardial node 0.7 cm in short axis on image 42/2, formerly 0.6 cm. Lungs/Pleura: Right lower lobectomy and likely right middle lobectomy in the past. Left lower lobe nodule 0.8 by 0.8 cm, formerly the same. Stable 0.3 cm left lower lobe nodule on image 105/6. Musculoskeletal: Old right rib deformities from  prior fractures. Chondrocalcinosis. Mild thoracic spondylosis. CT ABDOMEN PELVIS FINDINGS Hepatobiliary: Chronic 5.0 by 3.7 cm hypodense lesion along the anterior margin of the left hepatic lobe. No new liver lesion is appreciated on today's noncontrast exam. Prior cholecystectomy. Prior lesion posteriorly in segment 6 of  the liver is only faintly seen and appears roughly similar to prior exam, about 1.1 by 0.7 cm. Pancreas: Unremarkable pancreatic contour. Spleen: Unremarkable Adrenals/Urinary Tract: Right nephrectomy with ring density around adipose tissue along the resection site, similar to prior and probably from fat packing. Adrenal glands normal. Left kidney appears unremarkable. No urinary tract calculi. Stomach/Bowel: Considerable sigmoid colon diverticulosis with scattered diverticula in the descending colon. Vascular/Lymphatic: Aortoiliac atherosclerotic vascular disease. Peripancreatic node borderline enlarged at 1.0 cm in short axis, formerly 0.9 cm. Retrocaval lymph node previously measuring 1.1 cm in short axis currently measures 0.7 cm in short axis. Reproductive: Prostatomegaly. Other: The left omental tumor implant measures 1.6 by 0.8 cm, formerly 1.9 by 1.2 cm. Musculoskeletal: Grade 1 anterolisthesis at L4-5. Lumbar spondylosis and degenerative disc disease causing bilateral foraminal impingement at L4-5 and L5-S1. There is likely a synovial cyst or disc protrusion in the right neural foramen at L5-S1 containing nitrogen. IMPRESSION: 1. General improvement, with reduction in size of the left omental tumor implants and of the retrocaval lymph node compared to the prior exam. The 8 mm nodule in the left lower lobe is stable. The small metastatic lesion posteriorly in segment 6 of the liver is only faintly seen on today's exam, and mildly improved in size. No new metastatic lesions are identified. 2. New small pericardial effusion, significance uncertain. 3. Other imaging findings of potential clinical significance: Ascending aortic ectasia. Aortic Atherosclerosis (ICD10-I70.0). Coronary atherosclerosis. Chondrocalcinosis raising the possibility of CPPD arthropathy. Prior right nephrectomy. Sigmoid diverticulosis. Prostatomegaly. Lower lumbar impingement at L4-5 at L5-S1. Electronically Signed   By: Van Clines M.D.   On: 03/04/2018 10:33   Dg Abd 2 Views  Result Date: 03/14/2018 CLINICAL DATA:  73 year old male with a history of nausea. Known carcinoma EXAM: ABDOMEN - 2 VIEW COMPARISON:  Abdominal CT 03/04/2018 FINDINGS: Gas within stomach, small bowel, colon without abnormal distention. Evidence of diverticula. Dystrophic calcifications of the right abdomen, compatible with findings on prior CT. Cholecystectomy. Changes at the right lung base of hemidiaphragm elevation and atelectasis/pleural fluid. No acute displaced fracture. IMPRESSION: Nonobstructive bowel gas pattern. Electronically Signed   By: Corrie Mckusick D.O.   On: 03/14/2018 20:13      LAB RESULTS: Basic Metabolic Panel: Recent Labs  Lab 03/15/18 0340 03/16/18 0541  NA 134* 136  K 4.3 4.2  CL 103 105  CO2 24 25  GLUCOSE 88 101*  BUN 17 17  CREATININE 2.16* 2.15*  CALCIUM 8.9 8.9  MG 1.9 1.9   Liver Function Tests: Recent Labs  Lab 03/11/18 0913  AST 26  ALT 11  ALKPHOS 41  BILITOT 0.5  PROT 6.9  ALBUMIN 4.5   No results for input(s): LIPASE, AMYLASE in the last 168 hours. No results for input(s): AMMONIA in the last 168 hours. CBC: Recent Labs  Lab 03/15/18 0340 03/16/18 0541  WBC 5.1 5.6  NEUTROABS 2.3 2.6  HGB 12.0* 11.5*  HCT 37.5* 36.1*  MCV 88.4 91.2  PLT 232 211   Cardiac Enzymes: No results for input(s): CKTOTAL, CKMB, CKMBINDEX, TROPONINI in the last 168 hours. BNP: Invalid input(s): POCBNP CBG: Recent Labs  Lab 03/13/18 2018  GLUCAP 103*      Disposition and  Follow-up: Discharge Instructions    Diet general   Complete by:  As directed    Discharge instructions   Complete by:  As directed    Please review medication changes, STOP all the BP medications.  Please stay hydrated and drink 1-2 Ensure or Boost daily.   Increase activity slowly   Complete by:  As directed        DISPOSITION: Home   DISCHARGE FOLLOW-UP Follow-up Information    Croitoru, Mihai, MD.  Schedule an appointment as soon as possible for a visit in 2 week(s).   Specialty:  Cardiology Why:  needs repeat echo Contact information: [address] [place] [address] [phone]        Alex Downing, MD. Schedule an appointment as soon as possible for a visit in 2 week(s).   Specialty:  Family Medicine Contact information: [place] [address] [phone]            Time coordinating discharge:  35-minutes   Signed:   Estill Cotta M.D. [facility] 03/16/2018, 11:12 AM Pager: [phone]

## 2018-03-16 NOTE — Progress Notes (Signed)
Physical Therapy Treatment Patient Details Name: [name] MRN: [id number] DOB: [birth year] Today's Date: 03/16/2018    History of Present Illness 73 y.o. male  with hx of metastatic clear cell renal cell CA (mets to lung, liver, and peritoneum) on nivolumab (last dose in Nov 2019) who presents with generalized fatigue and loss of appetite.    PT Comments    Progressing with mobility.    Follow Up Recommendations  Home health PT;Supervision - Intermittent     Equipment Recommendations  (RW if pt doesn't already have one)    Recommendations for Other Services       Precautions / Restrictions Precautions Precautions: Fall Restrictions Weight Bearing Restrictions: No    Mobility  Bed Mobility Overal bed mobility: Needs Assistance Bed Mobility: Supine to Sit;Sit to Supine     Supine to sit: Supervision;HOB elevated Sit to supine: Supervision;HOB elevated   General bed mobility comments: for safety, lines. Increased time.  Transfers Overall transfer level: Needs assistance Equipment used: Rolling walker (2 wheeled) Transfers: Sit to/from Stand Sit to Stand: Min assist         General transfer comment: Assist to rise, steady, control descent (especially when using toilet). VCs safety, technique.  Ambulation/Gait Ambulation/Gait assistance: Min guard Gait Distance (Feet): 175 Feet Assistive device: Rolling walker (2 wheeled) Gait Pattern/deviations: Step-through pattern     General Gait Details: Close guard for safety. No LOB on today.    Stairs             Wheelchair Mobility    Modified Rankin (Stroke Patients Only)       Balance Overall balance assessment: Needs assistance           Standing balance-Leahy Scale: Fair                              Cognition Arousal/Alertness: Awake/alert Behavior During Therapy: WFL for tasks assessed/performed Overall Cognitive Status: Within Functional Limits for tasks assessed                                        Exercises      General Comments        Pertinent Vitals/Pain Pain Assessment: 0-10 Pain Score: 5  Pain Location: L knee Pain Descriptors / Indicators: Discomfort;Sore Pain Intervention(s): Monitored during session    Home Living                      Prior Function            PT Goals (current goals can now be found in the care plan section) Progress towards PT goals: Progressing toward goals    Frequency    Min 3X/week      PT Plan Current plan remains appropriate    Co-evaluation              AM-PAC PT "6 Clicks" Mobility   Outcome Measure  Help needed turning from your back to your side while in a flat bed without using bedrails?: A Little Help needed moving from lying on your back to sitting on the side of a flat bed without using bedrails?: A Little Help needed moving to and from a bed to a chair (including a wheelchair)?: A Little Help needed standing up from a chair using your arms (e.g., wheelchair or bedside chair)?: A  Little Help needed to walk in hospital room?: A Little Help needed climbing 3-5 steps with a railing? : A Little 6 Click Score: 18    End of Session Equipment Utilized During Treatment: Gait belt Activity Tolerance: Patient tolerated treatment well Patient left: in bed;with call bell/phone within reach;with bed alarm set   PT Visit Diagnosis: Difficulty in walking, not elsewhere classified (R26.2);Unsteadiness on feet (R26.81)     Time: 4360-6770 PT Time Calculation (min) (ACUTE ONLY): 13 min  Charges:  $Gait Training: 8-22 mins                       Weston Anna, PT Acute Rehabilitation Services Pager: [phone] Office: [phone]

## 2018-03-16 NOTE — Care Management Note (Signed)
Case Management Note  Patient Details  Name: [name] MRN: [id number] Date of Birth: 08-26-1945  Subjective/Objective:    Spoke with patient at bedside. He states he lives at home alone, his grandson may come to stay with him after d/c for a while. He has a daughter that lives close by. 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 He intiially declined HH but then agreed. No preference of agency.                 Action/Plan: Contacted [facility], [facility], [facility], and [facility], they all declined. Contacted [facility] and they have accepted. Has a walker at home, no other DME needs.   Expected Discharge Date:  03/16/18               Expected Discharge Plan:  [place]  In-House Referral:  NA  Discharge planning Services  CM Consult  Post Acute Care Choice:  Home Health Choice offered to:  Patient  DME Arranged:  N/A DME Agency:  NA  HH Arranged:  RN, PT, OT, Nurse's Aide, Disease Management [place] Agency:  [place]  Status of Service:  Completed, signed off  If discussed at [place] of Stay Meetings, dates discussed:    Additional Comments:  Guadalupe Maple, RN 03/16/2018, 11:58 AM

## 2018-03-22 ENCOUNTER — Other Ambulatory Visit: Payer: Self-pay | Admitting: *Deleted

## 2018-03-22 ENCOUNTER — Telehealth: Payer: Self-pay | Admitting: *Deleted

## 2018-03-22 ENCOUNTER — Inpatient Hospital Stay: Payer: Medicare HMO | Attending: Oncology | Admitting: Medical

## 2018-03-22 ENCOUNTER — Inpatient Hospital Stay: Payer: Medicare HMO

## 2018-03-22 VITALS — BP 108/68 | HR 82 | Temp 98.0°F | Resp 18 | Ht 69.0 in | Wt 189.6 lb

## 2018-03-22 DIAGNOSIS — Z905 Acquired absence of kidney: Secondary | ICD-10-CM | POA: Insufficient documentation

## 2018-03-22 DIAGNOSIS — C7800 Secondary malignant neoplasm of unspecified lung: Secondary | ICD-10-CM | POA: Insufficient documentation

## 2018-03-22 DIAGNOSIS — E039 Hypothyroidism, unspecified: Secondary | ICD-10-CM | POA: Diagnosis present

## 2018-03-22 DIAGNOSIS — R627 Adult failure to thrive: Secondary | ICD-10-CM | POA: Insufficient documentation

## 2018-03-22 DIAGNOSIS — I313 Pericardial effusion (noninflammatory): Secondary | ICD-10-CM | POA: Diagnosis present

## 2018-03-22 DIAGNOSIS — Z87891 Personal history of nicotine dependence: Secondary | ICD-10-CM | POA: Insufficient documentation

## 2018-03-22 DIAGNOSIS — E871 Hypo-osmolality and hyponatremia: Secondary | ICD-10-CM | POA: Diagnosis not present

## 2018-03-22 DIAGNOSIS — C641 Malignant neoplasm of right kidney, except renal pelvis: Secondary | ICD-10-CM

## 2018-03-22 DIAGNOSIS — C786 Secondary malignant neoplasm of retroperitoneum and peritoneum: Secondary | ICD-10-CM | POA: Diagnosis present

## 2018-03-22 DIAGNOSIS — D630 Anemia in neoplastic disease: Secondary | ICD-10-CM | POA: Diagnosis present

## 2018-03-22 DIAGNOSIS — G4700 Insomnia, unspecified: Secondary | ICD-10-CM | POA: Diagnosis not present

## 2018-03-22 DIAGNOSIS — Z7982 Long term (current) use of aspirin: Secondary | ICD-10-CM

## 2018-03-22 DIAGNOSIS — C787 Secondary malignant neoplasm of liver and intrahepatic bile duct: Secondary | ICD-10-CM | POA: Diagnosis not present

## 2018-03-22 DIAGNOSIS — C771 Secondary and unspecified malignant neoplasm of intrathoracic lymph nodes: Secondary | ICD-10-CM | POA: Diagnosis present

## 2018-03-22 DIAGNOSIS — C649 Malignant neoplasm of unspecified kidney, except renal pelvis: Secondary | ICD-10-CM

## 2018-03-22 DIAGNOSIS — E222 Syndrome of inappropriate secretion of antidiuretic hormone: Secondary | ICD-10-CM | POA: Diagnosis not present

## 2018-03-22 DIAGNOSIS — E230 Hypopituitarism: Secondary | ICD-10-CM | POA: Diagnosis present

## 2018-03-22 DIAGNOSIS — M1990 Unspecified osteoarthritis, unspecified site: Secondary | ICD-10-CM | POA: Diagnosis present

## 2018-03-22 DIAGNOSIS — E860 Dehydration: Secondary | ICD-10-CM | POA: Diagnosis present

## 2018-03-22 DIAGNOSIS — I129 Hypertensive chronic kidney disease with stage 1 through stage 4 chronic kidney disease, or unspecified chronic kidney disease: Secondary | ICD-10-CM | POA: Diagnosis present

## 2018-03-22 DIAGNOSIS — Z85118 Personal history of other malignant neoplasm of bronchus and lung: Secondary | ICD-10-CM

## 2018-03-22 DIAGNOSIS — Z9049 Acquired absence of other specified parts of digestive tract: Secondary | ICD-10-CM

## 2018-03-22 DIAGNOSIS — Z79899 Other long term (current) drug therapy: Secondary | ICD-10-CM

## 2018-03-22 DIAGNOSIS — E2740 Unspecified adrenocortical insufficiency: Secondary | ICD-10-CM | POA: Diagnosis present

## 2018-03-22 DIAGNOSIS — E875 Hyperkalemia: Secondary | ICD-10-CM | POA: Diagnosis present

## 2018-03-22 DIAGNOSIS — K5900 Constipation, unspecified: Secondary | ICD-10-CM | POA: Insufficient documentation

## 2018-03-22 DIAGNOSIS — Z8249 Family history of ischemic heart disease and other diseases of the circulatory system: Secondary | ICD-10-CM

## 2018-03-22 DIAGNOSIS — Z888 Allergy status to other drugs, medicaments and biological substances status: Secondary | ICD-10-CM

## 2018-03-22 DIAGNOSIS — R0902 Hypoxemia: Secondary | ICD-10-CM | POA: Diagnosis present

## 2018-03-22 DIAGNOSIS — I10 Essential (primary) hypertension: Secondary | ICD-10-CM | POA: Insufficient documentation

## 2018-03-22 DIAGNOSIS — I951 Orthostatic hypotension: Secondary | ICD-10-CM | POA: Insufficient documentation

## 2018-03-22 DIAGNOSIS — N183 Chronic kidney disease, stage 3 (moderate): Secondary | ICD-10-CM | POA: Diagnosis present

## 2018-03-22 DIAGNOSIS — E43 Unspecified severe protein-calorie malnutrition: Secondary | ICD-10-CM | POA: Diagnosis present

## 2018-03-22 DIAGNOSIS — I2510 Atherosclerotic heart disease of native coronary artery without angina pectoris: Secondary | ICD-10-CM | POA: Diagnosis present

## 2018-03-22 DIAGNOSIS — Z85528 Personal history of other malignant neoplasm of kidney: Secondary | ICD-10-CM

## 2018-03-22 DIAGNOSIS — R410 Disorientation, unspecified: Secondary | ICD-10-CM | POA: Diagnosis not present

## 2018-03-22 LAB — CBC WITH DIFFERENTIAL (CANCER CENTER ONLY)
Abs Immature Granulocytes: 0.02 10*3/uL (ref 0.00–0.07)
Basophils Absolute: 0 10*3/uL (ref 0.0–0.1)
Basophils Relative: 0 %
Eosinophils Absolute: 0.8 10*3/uL — ABNORMAL HIGH (ref 0.0–0.5)
Eosinophils Relative: 9 %
HCT: 35.1 % — ABNORMAL LOW (ref 39.0–52.0)
Hemoglobin: 12.1 g/dL — ABNORMAL LOW (ref 13.0–17.0)
Immature Granulocytes: 0 %
Lymphocytes Relative: 27 %
Lymphs Abs: 2.5 10*3/uL (ref 0.7–4.0)
MCH: 28.3 pg (ref 26.0–34.0)
MCHC: 34.5 g/dL (ref 30.0–36.0)
MCV: 82.2 fL (ref 80.0–100.0)
Monocytes Absolute: 0.5 10*3/uL (ref 0.1–1.0)
Monocytes Relative: 5 %
Neutro Abs: 5.4 10*3/uL (ref 1.7–7.7)
Neutrophils Relative %: 59 %
Platelet Count: 254 10*3/uL (ref 150–400)
RBC: 4.27 MIL/uL (ref 4.22–5.81)
RDW: 13.7 % (ref 11.5–15.5)
WBC Count: 9.1 10*3/uL (ref 4.0–10.5)
nRBC: 0 % (ref 0.0–0.2)

## 2018-03-22 LAB — CMP (CANCER CENTER ONLY)
ALT: 16 U/L (ref 0–44)
AST: 58 U/L — ABNORMAL HIGH (ref 15–41)
Albumin: 4.2 g/dL (ref 3.5–5.0)
Alkaline Phosphatase: 45 U/L (ref 38–126)
Anion gap: 8 (ref 5–15)
BUN: 22 mg/dL (ref 8–23)
CO2: 26 mmol/L (ref 22–32)
Calcium: 9.4 mg/dL (ref 8.9–10.3)
Chloride: 86 mmol/L — ABNORMAL LOW (ref 98–111)
Creatinine: 1.84 mg/dL — ABNORMAL HIGH (ref 0.61–1.24)
GFR, Est AFR Am: 41 mL/min — ABNORMAL LOW (ref >60)
GFR, Estimated: 35 mL/min — ABNORMAL LOW (ref >60)
Glucose, Bld: 108 mg/dL — ABNORMAL HIGH (ref 70–99)
POTASSIUM: 4 mmol/L (ref 3.5–5.1)
Sodium: 120 mmol/L — ABNORMAL LOW (ref 135–145)
Total Bilirubin: 0.8 mg/dL (ref 0.3–1.2)
Total Protein: 7.6 g/dL (ref 6.5–8.1)

## 2018-03-22 MED ORDER — SODIUM CHLORIDE 0.9 % IV SOLN
Freq: Once | INTRAVENOUS | Status: AC
  Administered 2018-03-22: 15:00:00 via INTRAVENOUS
  Filled 2018-03-22: qty 250

## 2018-03-22 MED ORDER — TEMAZEPAM 7.5 MG PO CAPS: 7.5000 mg | ORAL_CAPSULE | Freq: Every evening | ORAL | 0 refills | Status: DC | PRN

## 2018-03-22 NOTE — Patient Instructions (Addendum)
Rehydration, Adult Rehydration is the replacement of body fluids and salts and minerals (electrolytes) that are lost during dehydration. Dehydration is when there is not enough fluid or water in the body. This happens when you lose more fluids than you take in. Common causes of dehydration include:  Vomiting.  Diarrhea.  Excessive sweating, such as from heat exposure or exercise.  Taking medicines that cause the body to lose excess fluid (diuretics).  Impaired kidney function.  Not drinking enough fluid.  Certain illnesses or infections.  Certain poorly controlled long-term (chronic) illnesses, such as diabetes, heart disease, and kidney disease.  Symptoms of mild dehydration may include thirst, dry lips and mouth, dry skin, and dizziness. Symptoms of severe dehydration may include increased heart rate, confusion, fainting, and not urinating. You can rehydrate by drinking certain fluids or getting fluids through an IV tube, as told by your health care provider. What are the risks? Generally, rehydration is safe. However, one problem that can happen is taking in too much fluid (overhydration). This is rare. If overhydration happens, it can cause an electrolyte imbalance, kidney failure, or a decrease in salt (sodium) levels in the body. How to rehydrate Follow instructions from your health care provider for rehydration. The kind of fluid you should drink and the amount you should drink depend on your condition.  If directed by your health care provider, drink an oral rehydration solution (ORS). This is a drink designed to treat dehydration that is found in pharmacies and retail stores. ? Make an ORS by following instructions on the package. ? Start by drinking small amounts, about  cup (120 mL) every 5-10 minutes. ? Slowly increase how much you drink until you have taken the amount recommended by your health care provider.  Drink enough clear fluids to keep your urine clear or pale  yellow. If you were instructed to drink an ORS, finish the ORS first, then start slowly drinking other clear fluids. Drink fluids such as: ? Water. Do not drink only water. Doing that can lead to having too little sodium in your body (hyponatremia). ? Ice chips. ? Fruit juice that you have added water to (diluted juice). ? Low-calorie sports drinks.  If you are severely dehydrated, your health care provider may recommend that you receive fluids through an IV tube in the hospital.  Do not take sodium tablets. Doing that can lead to the condition of having too much sodium in your body (hypernatremia). Eating while you rehydrate Follow instructions from your health care provider about what to eat while you rehydrate. Your health care provider may recommend that you slowly begin eating regular foods in small amounts.  Eat foods that contain a healthy balance of electrolytes, such as bananas, oranges, potatoes, tomatoes, and spinach.  Avoid foods that are greasy or contain a lot of fat or sugar.  In some cases, you may get nutrition through a feeding tube that is passed through your nose and into your stomach (nasogastric tube, or NG tube). This may be done if you have uncontrolled vomiting or diarrhea. Beverages to avoid Certain beverages may make dehydration worse. While you rehydrate, avoid:  Alcohol.  Caffeine.  Drinks that contain a lot of sugar. These include: ? High-calorie sports drinks. ? Fruit juice that is not diluted. ? Soda.  Check nutrition labels to see how much sugar or caffeine a beverage contains. Signs of dehydration recovery You may be recovering from dehydration if:  You are urinating more often than before you started   rehydrating.  Your urine is clear or pale yellow.  Your energy level improves.  You vomit less frequently.  You have diarrhea less frequently.  Your appetite improves or returns to normal.  You feel less dizzy or less light-headed.  Your  skin tone and color start to look more normal. Contact a health care provider if:  You continue to have symptoms of mild dehydration, such as: ? Thirst. ? Dry lips. ? Slightly dry mouth. ? Dry, warm skin. ? Dizziness.  You continue to vomit or have diarrhea. Get help right away if:  You have symptoms of dehydration that get worse.  You feel: ? Confused. ? Weak. ? Like you are going to faint.  You have not urinated in 6-8 hours.  You have very dark urine.  You have trouble breathing.  Your heart rate while sitting still is over 100 beats a minute.  You cannot drink fluids without vomiting.  You have vomiting or diarrhea that: ? Gets worse. ? Does not go away.  You have a fever. This information is not intended to replace advice given to you by your health care provider. Make sure you discuss any questions you have with your health care provider. Document Released: 04/27/2011 Document Revised: 08/23/2015 Document Reviewed: 03/29/2015 Elsevier Interactive Patient Education  2019 [place].    Constipation Management  Magnesium Citrate, drink 1/2 bottle, drink remainder if no bowel movement with 30 to 60 minutes  Or  30 mg (1 tablespoon) of Milk of Magnesia in 8 ounces of prune juice, warm in microwave for 20 seconds    Begin the following after you have had a bowel movement:  Senna-S, 1 to 2 tablets twice daily  MiraLAX 17 grams in 8 ounces of liquids 1 to 2 times daily as needed   Remember to remain well hydrated. Drink, Drink, Drink non-caffeinated beverages.   Adjust these medications based on your response. If your bowel movements become too loose then decrease the amount of Senna-S and/or MiraLAX that you are using. If your bowel movements become too firm or are difficult to pass, the increase the amount of Senna-S and/or MiraLAX that you are using and increase your intake of water.   NEVER, NEVER, NEVER use an enema or suppositories unless your  provider has given their approval.

## 2018-03-22 NOTE — Telephone Encounter (Signed)
Received TC from pt's daughter.  She states her dad is 'sick' and getting weaker.  He was in the hospital 1/26-1/29 with generalized weakness, dehydration, failure to thrive, AKI superimposed on CKD stage 3. Daughter states he is not any better overall. Not eating much, decreased fluid intake, not sleeping at night or during the day, increased weakness.  Denies fever, chills.  Some nausea, no vomiting. Has not had a bowel movement since 03/11/18 per the daughter.  She states he is taking 'stool softners' but didn't know the name of it.  She would like him to be seen today. OK with Dr. Alen Blew for labs and [facility] this morning. Daughter aware.

## 2018-03-22 NOTE — Progress Notes (Signed)
Pt given 1L IVF, reports feeling better afterwards.  Given food and drink, tolerated well.  VSS.  WC to exit with daughter and belongings.  Verbalized understanding to call back within next two days if he needs more fluids.

## 2018-03-23 NOTE — Progress Notes (Signed)
[facility] Progress Note   [name] [id number] January 29, 1946 73 y.o.  [name] is managed by Dr. Zola Button  Actively treated with chemotherapy/immunotherapy/hormonal therapy: yes  Current Therapy: Nivolumab (last dosed November 2019)  Last Treated: N/A  Assessment: Plan:    Orthostasis - Plan: 0.9 %  sodium chloride infusion  Insomnia, unspecified type - Plan: temazepam (RESTORIL) 7.5 MG capsule  Primary malignant neoplasm of kidney with metastasis from kidney to other site, unspecified laterality ([place])    1) Metastatic clear cell renal cancer: The patient is s/p his last treatment of Nivolumab as of November 2019.  He is scheduled to see Dr. Alen Blew in follow up on 03/24/2018.  2) Orthostasis: The patient was found to be hypotensive with a blood pressure of 98/54.  He was given 1L normal saline IV.  3) Insomnia:  The patient was given a prescription for Restoril 7.5 mg PO every night at bedtime PRN for insomnia.  4) Constipation: See patient discharge instructions.    Please see After Visit Summary for patient specific instructions.  Future Appointments  Date Time Provider [place]  03/24/2018  8:00 AM [facility] ONLY [facility] None  03/24/2018  8:30 AM Wyatt Portela, MD [facility] None  03/24/2018  9:15 AM [facility] INFUSION [facility] None  04/12/2018  8:00 AM [facility] LAB 1 [facility] None  04/12/2018  8:45 AM [facility] INFUSION [facility] None  04/22/2018  9:00 AM Almyra Deforest, PA [facility]    No orders of the defined types were placed in this encounter.      Subjective:   Patient ID:  [name] is a 73 y.o. (DOB Sep 05, 1945) male.  Chief Complaint:  Chief Complaint  Patient presents with  . Fatigue    HPI [name] is a 73 y.o. male with a metastatic clear cell renal cancer who is followed by Dr. Alen Blew.  He last was treated with Nivolumab in November 2019.  He is seen today after an admission from 03/13/2018  to 03/16/2018 for generalized weakness, failure to thrive, hypotension, and an acute kidney injury.  During his hospitalization he was found to have a small pericardial effusion.  He reports insomnia, positional dizziness, anorexia with little oral intake except for Ensure, and constipation.  Medications: I have reviewed the patient's current medications.  Allergies:  Allergies  Allergen Reactions  . Benadryl [Diphenhydramine] Other (See Comments)    Severe restless legs    Past Medical History:  Diagnosis Date  . Arthritis   . Chronic kidney disease    only has one kidney   . 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 02/01/2015  . Coronary artery disease    followed by Dr.Tilley  . GERD (gastroesophageal reflux disease)   . Heart murmur   . High cholesterol   . Hx of cancer of lung 1980's  . Hypertension   . Incisional hernia 08/01/2015  . Lung cancer ([place])   . Recurrent umbilical hernia 8/84/1660  . Right renal mass     Past Surgical History:  Procedure Laterality Date  . APPENDECTOMY  age 26  . CHOLECYSTECTOMY  1980's  . LAPAROSCOPIC LYSIS OF ADHESIONS N/A 08/01/2015   Procedure: LAPAROSCOPIC LYSIS OF ADHESIONS;  Surgeon: Michael Boston, MD;  Location: [facility];  Service: General;  Laterality: N/A;  . LUNG LOBECTOMY  1992   lung cancer- patient has staples in lung not to have MRI per patient   . PILONIDAL CYST EXCISION  2011  Dr Harlow Asa  . ROBOTIC ASSITED PARTIAL NEPHRECTOMY Right 02/01/2015   Procedure: RIGHT ROBOTIC ASSISTED LAPAROSOCOPY NEPHRECTOMY;  Surgeon: Ardis Hughs, MD;  Location: [facility];  Service: Urology;  Laterality: Right;  . VENTRAL HERNIA REPAIR N/A 08/01/2015   Procedure: LAPAROSCOPIC VENTRAL WALL HERNIA WITH MESH;  Surgeon: Michael Boston, MD;  Location: [facility];  Service: General;  Laterality: N/A;    Family History  Problem Relation Age of Onset  . Heart attack Father     Social History   Socioeconomic History  . Marital  status: Married    Spouse name: Not on file  . Number of children: Not on file  . Years of education: Not on file  . Highest education level: Not on file  Occupational History  . Occupation: Retired  Scientific laboratory technician  . Financial resource strain: Not on file  . Food insecurity:    Worry: Not on file    Inability: Not on file  . Transportation needs:    Medical: Not on file    Non-medical: Not on file  Tobacco Use  . Smoking status: Former Smoker    Packs/day: 1.00    Years: 20.00    Pack years: 20.00    Last attempt to quit: 01/29/1988    Years since quitting: 30.1  . Smokeless tobacco: Never Used  Substance and Sexual Activity  . Alcohol use: Yes    Comment: rare  . Drug use: No  . Sexual activity: Not on file  Lifestyle  . Physical activity:    Days per week: Not on file    Minutes per session: Not on file  . Stress: Not on file  Relationships  . Social connections:    Talks on phone: Not on file    Gets together: Not on file    Attends religious service: Not on file    Active member of club or organization: Not on file    Attends meetings of clubs or organizations: Not on file    Relationship status: Not on file  . Intimate partner violence:    Fear of current or ex partner: Not on file    Emotionally abused: Not on file    Physically abused: Not on file    Forced sexual activity: Not on file  Other Topics Concern  . Not on file  Social History Narrative  . Not on file    Past Medical History, Surgical history, Social history, and Family history were reviewed and updated as appropriate.   Please see review of systems for further details on the patient's review from today.   Review of Systems:  Review of Systems  Constitutional: Positive for appetite change. Negative for chills, diaphoresis, fever and unexpected weight change.  HENT: Negative for trouble swallowing.   Respiratory: Negative for cough, chest tightness and shortness of breath.   Cardiovascular:  Negative for chest pain and palpitations.  Gastrointestinal: Positive for constipation. Negative for abdominal pain, diarrhea, nausea and vomiting.  Neurological: Positive for dizziness. Negative for headaches.  Psychiatric/Behavioral: Positive for sleep disturbance.    Objective:   Physical Exam:  BP 108/68   Pulse 82   Temp 98 F (36.7 C) (Oral)   Resp 18   Ht 5\' 9"  (1.753 m)   Wt 189 lb 9.6 oz (86 kg)   SpO2 97%   BMI 28.00 kg/m  ECOG: 1  Physical Exam Constitutional:      General: He is not in acute distress.    Appearance: He  is not diaphoretic.  HENT:     Head: Normocephalic and atraumatic.  Cardiovascular:     Rate and Rhythm: Normal rate and regular rhythm.     Heart sounds: Normal heart sounds. No murmur. No friction rub. No gallop.   Pulmonary:     Effort: Pulmonary effort is normal. No respiratory distress.     Breath sounds: Normal breath sounds. No wheezing or rales.  Abdominal:     General: Bowel sounds are normal. There is no distension.     Palpations: Abdomen is soft. There is no mass.     Tenderness: There is no abdominal tenderness. There is no guarding or rebound.  Skin:    General: Skin is warm and dry.  Neurological:     Mental Status: He is alert.     Gait: Gait normal.     Lab Review:     Component Value Date/Time   NA 120 (L) 03/22/2018 1327   NA 134 03/11/2018 0913   K 4.0 03/22/2018 1327   CL 86 (L) 03/22/2018 1327   CO2 26 03/22/2018 1327   GLUCOSE 108 (H) 03/22/2018 1327   BUN 22 03/22/2018 1327   BUN 17 03/11/2018 0913   CREATININE 1.84 (H) 03/22/2018 1327   CALCIUM 9.4 03/22/2018 1327   PROT 7.6 03/22/2018 1327   PROT 6.9 03/11/2018 0913   ALBUMIN 4.2 03/22/2018 1327   ALBUMIN 4.5 03/11/2018 0913   AST 58 (H) 03/22/2018 1327   ALT 16 03/22/2018 1327   ALKPHOS 45 03/22/2018 1327   BILITOT 0.8 03/22/2018 1327   GFRNONAA 35 (L) 03/22/2018 1327   GFRAA 41 (L) 03/22/2018 1327       Component Value Date/Time   WBC 9.1  03/22/2018 1327   WBC 5.6 03/16/2018 0541   RBC 4.27 03/22/2018 1327   HGB 12.1 (L) 03/22/2018 1327   HGB 13.3 03/11/2018 0913   HCT 35.1 (L) 03/22/2018 1327   HCT 40.1 03/11/2018 0913   PLT 254 03/22/2018 1327   PLT 248 03/11/2018 0913   MCV 82.2 03/22/2018 1327   MCV 86 03/11/2018 0913   MCH 28.3 03/22/2018 1327   MCHC 34.5 03/22/2018 1327   RDW 13.7 03/22/2018 1327   RDW 15.6 (H) 03/11/2018 0913   LYMPHSABS 2.5 03/22/2018 1327   LYMPHSABS 3.0 03/11/2018 0913   MONOABS 0.5 03/22/2018 1327   EOSABS 0.8 (H) 03/22/2018 1327   EOSABS 0.7 (H) 03/11/2018 0913   BASOSABS 0.0 03/22/2018 1327   BASOSABS 0.1 03/11/2018 0913   -------------------------------  Imaging from last 24 hours (if applicable):  Radiology interpretation: Ct Abdomen Pelvis Wo Contrast  Result Date: 03/04/2018 CLINICAL DATA:  Right renal cell carcinoma metastatic to the lungs. Ongoing chemotherapy. Remote history of right lung cancer. EXAM: CT CHEST, ABDOMEN AND PELVIS WITHOUT CONTRAST TECHNIQUE: Multidetector CT imaging of the chest, abdomen and pelvis was performed following the standard protocol without IV contrast. COMPARISON:  12/14/2017 FINDINGS: CT CHEST FINDINGS Cardiovascular: Ascending thoracic aortic ectasia at 3.9 cm. Coronary, aortic arch, and branch vessel atherosclerotic vascular disease. New small pericardial effusion. Mediastinum/Nodes: Postoperative findings from prior right lobectomy. Anterior pericardial node 0.7 cm in short axis on image 42/2, formerly 0.6 cm. Lungs/Pleura: Right lower lobectomy and likely right middle lobectomy in the past. Left lower lobe nodule 0.8 by 0.8 cm, formerly the same. Stable 0.3 cm left lower lobe nodule on image 105/6. Musculoskeletal: Old right rib deformities from prior fractures. Chondrocalcinosis. Mild thoracic spondylosis. CT ABDOMEN PELVIS FINDINGS Hepatobiliary: Chronic 5.0  by 3.7 cm hypodense lesion along the anterior margin of the left hepatic lobe. No new liver  lesion is appreciated on today's noncontrast exam. Prior cholecystectomy. Prior lesion posteriorly in segment 6 of the liver is only faintly seen and appears roughly similar to prior exam, about 1.1 by 0.7 cm. Pancreas: Unremarkable pancreatic contour. Spleen: Unremarkable Adrenals/Urinary Tract: Right nephrectomy with ring density around adipose tissue along the resection site, similar to prior and probably from fat packing. Adrenal glands normal. Left kidney appears unremarkable. No urinary tract calculi. Stomach/Bowel: Considerable sigmoid colon diverticulosis with scattered diverticula in the descending colon. Vascular/Lymphatic: Aortoiliac atherosclerotic vascular disease. Peripancreatic node borderline enlarged at 1.0 cm in short axis, formerly 0.9 cm. Retrocaval lymph node previously measuring 1.1 cm in short axis currently measures 0.7 cm in short axis. Reproductive: Prostatomegaly. Other: The left omental tumor implant measures 1.6 by 0.8 cm, formerly 1.9 by 1.2 cm. Musculoskeletal: Grade 1 anterolisthesis at L4-5. Lumbar spondylosis and degenerative disc disease causing bilateral foraminal impingement at L4-5 and L5-S1. There is likely a synovial cyst or disc protrusion in the right neural foramen at L5-S1 containing nitrogen. IMPRESSION: 1. General improvement, with reduction in size of the left omental tumor implants and of the retrocaval lymph node compared to the prior exam. The 8 mm nodule in the left lower lobe is stable. The small metastatic lesion posteriorly in segment 6 of the liver is only faintly seen on today's exam, and mildly improved in size. No new metastatic lesions are identified. 2. New small pericardial effusion, significance uncertain. 3. Other imaging findings of potential clinical significance: Ascending aortic ectasia. Aortic Atherosclerosis (ICD10-I70.0). Coronary atherosclerosis. Chondrocalcinosis raising the possibility of CPPD arthropathy. Prior right nephrectomy. Sigmoid  diverticulosis. Prostatomegaly. Lower lumbar impingement at L4-5 at L5-S1. Electronically Signed   By:  Clines M.D.   On: 03/04/2018 10:33   Dg Chest 2 View  Result Date: 03/13/2018 CLINICAL DATA:  Weakness EXAM: CHEST - 2 VIEW COMPARISON:  08/19/2015 FINDINGS: Chronic right pleural and parenchymal scarring with postsurgical changes in the right hilar area. No acute consolidation or effusion. Stable cardiomediastinal silhouette. No pneumothorax. IMPRESSION: No active cardiopulmonary disease. Stable right postsurgical changes Electronically Signed   By: Donavan Foil M.D.   On: 03/13/2018 23:18   Ct Chest Wo Contrast  Result Date: 03/04/2018 CLINICAL DATA:  Right renal cell carcinoma metastatic to the lungs. Ongoing chemotherapy. Remote history of right lung cancer. EXAM: CT CHEST, ABDOMEN AND PELVIS WITHOUT CONTRAST TECHNIQUE: Multidetector CT imaging of the chest, abdomen and pelvis was performed following the standard protocol without IV contrast. COMPARISON:  12/14/2017 FINDINGS: CT CHEST FINDINGS Cardiovascular: Ascending thoracic aortic ectasia at 3.9 cm. Coronary, aortic arch, and branch vessel atherosclerotic vascular disease. New small pericardial effusion. Mediastinum/Nodes: Postoperative findings from prior right lobectomy. Anterior pericardial node 0.7 cm in short axis on image 42/2, formerly 0.6 cm. Lungs/Pleura: Right lower lobectomy and likely right middle lobectomy in the past. Left lower lobe nodule 0.8 by 0.8 cm, formerly the same. Stable 0.3 cm left lower lobe nodule on image 105/6. Musculoskeletal: Old right rib deformities from prior fractures. Chondrocalcinosis. Mild thoracic spondylosis. CT ABDOMEN PELVIS FINDINGS Hepatobiliary: Chronic 5.0 by 3.7 cm hypodense lesion along the anterior margin of the left hepatic lobe. No new liver lesion is appreciated on today's noncontrast exam. Prior cholecystectomy. Prior lesion posteriorly in segment 6 of the liver is only faintly seen  and appears roughly similar to prior exam, about 1.1 by 0.7 cm. Pancreas: Unremarkable pancreatic contour.  Spleen: Unremarkable Adrenals/Urinary Tract: Right nephrectomy with ring density around adipose tissue along the resection site, similar to prior and probably from fat packing. Adrenal glands normal. Left kidney appears unremarkable. No urinary tract calculi. Stomach/Bowel: Considerable sigmoid colon diverticulosis with scattered diverticula in the descending colon. Vascular/Lymphatic: Aortoiliac atherosclerotic vascular disease. Peripancreatic node borderline enlarged at 1.0 cm in short axis, formerly 0.9 cm. Retrocaval lymph node previously measuring 1.1 cm in short axis currently measures 0.7 cm in short axis. Reproductive: Prostatomegaly. Other: The left omental tumor implant measures 1.6 by 0.8 cm, formerly 1.9 by 1.2 cm. Musculoskeletal: Grade 1 anterolisthesis at L4-5. Lumbar spondylosis and degenerative disc disease causing bilateral foraminal impingement at L4-5 and L5-S1. There is likely a synovial cyst or disc protrusion in the right neural foramen at L5-S1 containing nitrogen. IMPRESSION: 1. General improvement, with reduction in size of the left omental tumor implants and of the retrocaval lymph node compared to the prior exam. The 8 mm nodule in the left lower lobe is stable. The small metastatic lesion posteriorly in segment 6 of the liver is only faintly seen on today's exam, and mildly improved in size. No new metastatic lesions are identified. 2. New small pericardial effusion, significance uncertain. 3. Other imaging findings of potential clinical significance: Ascending aortic ectasia. Aortic Atherosclerosis (ICD10-I70.0). Coronary atherosclerosis. Chondrocalcinosis raising the possibility of CPPD arthropathy. Prior right nephrectomy. Sigmoid diverticulosis. Prostatomegaly. Lower lumbar impingement at L4-5 at L5-S1. Electronically Signed   By:  Clines M.D.   On: 03/04/2018 10:33     Dg Abd 2 Views  Result Date: 03/14/2018 CLINICAL DATA:  72 year old male with a history of nausea. Known carcinoma EXAM: ABDOMEN - 2 VIEW COMPARISON:  Abdominal CT 03/04/2018 FINDINGS: Gas within stomach, small bowel, colon without abnormal distention. Evidence of diverticula. Dystrophic calcifications of the right abdomen, compatible with findings on prior CT. Cholecystectomy. Changes at the right lung base of hemidiaphragm elevation and atelectasis/pleural fluid. No acute displaced fracture. IMPRESSION: Nonobstructive bowel gas pattern. Electronically Signed   By: Corrie Mckusick D.O.   On: 03/14/2018 20:13

## 2018-03-24 ENCOUNTER — Telehealth: Payer: Self-pay

## 2018-03-24 ENCOUNTER — Encounter (HOSPITAL_COMMUNITY): Payer: Self-pay

## 2018-03-24 ENCOUNTER — Inpatient Hospital Stay (HOSPITAL_COMMUNITY)
Admission: EM | Admit: 2018-03-24 | Discharge: 2018-03-28 | DRG: 643 | Disposition: A | Discharge status: Home Health | Payer: Medicare HMO | Attending: Internal Medicine | Admitting: Internal Medicine

## 2018-03-24 ENCOUNTER — Inpatient Hospital Stay (HOSPITAL_COMMUNITY): Payer: Medicare HMO

## 2018-03-24 ENCOUNTER — Inpatient Hospital Stay: Payer: Medicare HMO

## 2018-03-24 ENCOUNTER — Other Ambulatory Visit: Payer: Self-pay

## 2018-03-24 ENCOUNTER — Inpatient Hospital Stay: Payer: Medicare HMO | Admitting: Oncology

## 2018-03-24 VITALS — BP 97/65 | HR 60 | Temp 97.6°F | Resp 17 | Ht 69.0 in | Wt 189.6 lb

## 2018-03-24 DIAGNOSIS — C641 Malignant neoplasm of right kidney, except renal pelvis: Secondary | ICD-10-CM

## 2018-03-24 DIAGNOSIS — C787 Secondary malignant neoplasm of liver and intrahepatic bile duct: Secondary | ICD-10-CM

## 2018-03-24 DIAGNOSIS — E2740 Unspecified adrenocortical insufficiency: Secondary | ICD-10-CM | POA: Diagnosis not present

## 2018-03-24 DIAGNOSIS — R531 Weakness: Secondary | ICD-10-CM

## 2018-03-24 DIAGNOSIS — Z79899 Other long term (current) drug therapy: Secondary | ICD-10-CM | POA: Diagnosis not present

## 2018-03-24 DIAGNOSIS — C786 Secondary malignant neoplasm of retroperitoneum and peritoneum: Secondary | ICD-10-CM

## 2018-03-24 DIAGNOSIS — R410 Disorientation, unspecified: Secondary | ICD-10-CM | POA: Diagnosis not present

## 2018-03-24 DIAGNOSIS — C649 Malignant neoplasm of unspecified kidney, except renal pelvis: Secondary | ICD-10-CM

## 2018-03-24 DIAGNOSIS — I10 Essential (primary) hypertension: Secondary | ICD-10-CM | POA: Diagnosis not present

## 2018-03-24 DIAGNOSIS — I951 Orthostatic hypotension: Secondary | ICD-10-CM

## 2018-03-24 DIAGNOSIS — C771 Secondary and unspecified malignant neoplasm of intrathoracic lymph nodes: Secondary | ICD-10-CM | POA: Diagnosis present

## 2018-03-24 DIAGNOSIS — R627 Adult failure to thrive: Secondary | ICD-10-CM | POA: Diagnosis not present

## 2018-03-24 DIAGNOSIS — I313 Pericardial effusion (noninflammatory): Secondary | ICD-10-CM | POA: Diagnosis present

## 2018-03-24 DIAGNOSIS — E871 Hypo-osmolality and hyponatremia: Secondary | ICD-10-CM

## 2018-03-24 DIAGNOSIS — M1990 Unspecified osteoarthritis, unspecified site: Secondary | ICD-10-CM | POA: Diagnosis present

## 2018-03-24 DIAGNOSIS — Z87891 Personal history of nicotine dependence: Secondary | ICD-10-CM | POA: Diagnosis not present

## 2018-03-24 DIAGNOSIS — I2510 Atherosclerotic heart disease of native coronary artery without angina pectoris: Secondary | ICD-10-CM | POA: Diagnosis present

## 2018-03-24 DIAGNOSIS — D630 Anemia in neoplastic disease: Secondary | ICD-10-CM | POA: Diagnosis present

## 2018-03-24 DIAGNOSIS — Z85528 Personal history of other malignant neoplasm of kidney: Secondary | ICD-10-CM | POA: Diagnosis not present

## 2018-03-24 DIAGNOSIS — F05 Delirium due to known physiological condition: Secondary | ICD-10-CM

## 2018-03-24 DIAGNOSIS — E860 Dehydration: Secondary | ICD-10-CM

## 2018-03-24 DIAGNOSIS — E222 Syndrome of inappropriate secretion of antidiuretic hormone: Secondary | ICD-10-CM | POA: Diagnosis present

## 2018-03-24 DIAGNOSIS — E43 Unspecified severe protein-calorie malnutrition: Secondary | ICD-10-CM | POA: Diagnosis not present

## 2018-03-24 DIAGNOSIS — E875 Hyperkalemia: Secondary | ICD-10-CM | POA: Diagnosis present

## 2018-03-24 DIAGNOSIS — R5383 Other fatigue: Secondary | ICD-10-CM | POA: Diagnosis not present

## 2018-03-24 DIAGNOSIS — Z7982 Long term (current) use of aspirin: Secondary | ICD-10-CM | POA: Diagnosis not present

## 2018-03-24 DIAGNOSIS — Z888 Allergy status to other drugs, medicaments and biological substances status: Secondary | ICD-10-CM | POA: Diagnosis not present

## 2018-03-24 DIAGNOSIS — E271 Primary adrenocortical insufficiency: Secondary | ICD-10-CM | POA: Diagnosis not present

## 2018-03-24 DIAGNOSIS — E230 Hypopituitarism: Secondary | ICD-10-CM | POA: Diagnosis present

## 2018-03-24 DIAGNOSIS — G4700 Insomnia, unspecified: Secondary | ICD-10-CM | POA: Diagnosis present

## 2018-03-24 DIAGNOSIS — I129 Hypertensive chronic kidney disease with stage 1 through stage 4 chronic kidney disease, or unspecified chronic kidney disease: Secondary | ICD-10-CM | POA: Diagnosis present

## 2018-03-24 DIAGNOSIS — E039 Hypothyroidism, unspecified: Secondary | ICD-10-CM

## 2018-03-24 DIAGNOSIS — N183 Chronic kidney disease, stage 3 (moderate): Secondary | ICD-10-CM | POA: Diagnosis not present

## 2018-03-24 DIAGNOSIS — Z85118 Personal history of other malignant neoplasm of bronchus and lung: Secondary | ICD-10-CM | POA: Diagnosis not present

## 2018-03-24 DIAGNOSIS — R0902 Hypoxemia: Secondary | ICD-10-CM | POA: Diagnosis present

## 2018-03-24 LAB — CBC WITH DIFFERENTIAL (CANCER CENTER ONLY)
Abs Immature Granulocytes: 0.02 10*3/uL (ref 0.00–0.07)
Basophils Absolute: 0 10*3/uL (ref 0.0–0.1)
Basophils Relative: 1 %
EOS ABS: 0.8 10*3/uL — AB (ref 0.0–0.5)
Eosinophils Relative: 13 %
HEMATOCRIT: 35.1 % — AB (ref 39.0–52.0)
Hemoglobin: 12.8 g/dL — ABNORMAL LOW (ref 13.0–17.0)
Immature Granulocytes: 0 %
Lymphocytes Relative: 33 %
Lymphs Abs: 2.1 10*3/uL (ref 0.7–4.0)
MCH: 29 pg (ref 26.0–34.0)
MCHC: 36.5 g/dL — ABNORMAL HIGH (ref 30.0–36.0)
MCV: 79.4 fL — AB (ref 80.0–100.0)
Monocytes Absolute: 0.3 10*3/uL (ref 0.1–1.0)
Monocytes Relative: 5 %
Neutro Abs: 3 10*3/uL (ref 1.7–7.7)
Neutrophils Relative %: 48 %
PLATELETS: 246 10*3/uL (ref 150–400)
RBC: 4.42 MIL/uL (ref 4.22–5.81)
RDW: 13.2 % (ref 11.5–15.5)
WBC Count: 6.3 10*3/uL (ref 4.0–10.5)
nRBC: 0 % (ref 0.0–0.2)

## 2018-03-24 LAB — CBC WITH DIFFERENTIAL/PLATELET
Abs Immature Granulocytes: 0.03 10*3/uL (ref 0.00–0.07)
Basophils Absolute: 0 10*3/uL (ref 0.0–0.1)
Basophils Relative: 0 %
Eosinophils Absolute: 0.8 10*3/uL — ABNORMAL HIGH (ref 0.0–0.5)
Eosinophils Relative: 13 %
HCT: 35.3 % — ABNORMAL LOW (ref 39.0–52.0)
Hemoglobin: 12.5 g/dL — ABNORMAL LOW (ref 13.0–17.0)
Immature Granulocytes: 1 %
Lymphocytes Relative: 34 %
Lymphs Abs: 2.2 10*3/uL (ref 0.7–4.0)
MCH: 29.3 pg (ref 26.0–34.0)
MCHC: 35.4 g/dL (ref 30.0–36.0)
MCV: 82.9 fL (ref 80.0–100.0)
Monocytes Absolute: 0.4 10*3/uL (ref 0.1–1.0)
Monocytes Relative: 6 %
Neutro Abs: 3 10*3/uL (ref 1.7–7.7)
Neutrophils Relative %: 46 %
Platelets: 252 10*3/uL (ref 150–400)
RBC: 4.26 MIL/uL (ref 4.22–5.81)
RDW: 13.5 % (ref 11.5–15.5)
WBC: 6.5 10*3/uL (ref 4.0–10.5)
nRBC: 0 % (ref 0.0–0.2)

## 2018-03-24 LAB — COMPREHENSIVE METABOLIC PANEL
ALT: 22 U/L (ref 0–44)
AST: 69 U/L — ABNORMAL HIGH (ref 15–41)
Albumin: 4.6 g/dL (ref 3.5–5.0)
Alkaline Phosphatase: 35 U/L — ABNORMAL LOW (ref 38–126)
Anion gap: 10 (ref 5–15)
BUN: 19 mg/dL (ref 8–23)
CO2: 24 mmol/L (ref 22–32)
Calcium: 8.8 mg/dL — ABNORMAL LOW (ref 8.9–10.3)
Chloride: 81 mmol/L — ABNORMAL LOW (ref 98–111)
Creatinine, Ser: 1.59 mg/dL — ABNORMAL HIGH (ref 0.61–1.24)
GFR calc Af Amer: 48 mL/min — ABNORMAL LOW (ref >60)
GFR calc non Af Amer: 42 mL/min — ABNORMAL LOW (ref >60)
Glucose, Bld: 79 mg/dL (ref 70–99)
Potassium: 4 mmol/L (ref 3.5–5.1)
Sodium: 115 mmol/L — CL (ref 135–145)
Total Bilirubin: 0.8 mg/dL (ref 0.3–1.2)
Total Protein: 7.6 g/dL (ref 6.5–8.1)

## 2018-03-24 LAB — TSH
TSH: 66.3 u[IU]/mL — ABNORMAL HIGH (ref 0.320–4.118)
TSH: 86.397 u[IU]/mL — ABNORMAL HIGH (ref 0.350–4.500)

## 2018-03-24 LAB — BASIC METABOLIC PANEL
Anion gap: 10 (ref 5–15)
Anion gap: 12 (ref 5–15)
BUN: 19 mg/dL (ref 8–23)
BUN: 21 mg/dL (ref 8–23)
CALCIUM: 8.7 mg/dL — AB (ref 8.9–10.3)
CO2: 23 mmol/L (ref 22–32)
CO2: 21 mmol/L — ABNORMAL LOW (ref 22–32)
Calcium: 8.9 mg/dL (ref 8.9–10.3)
Chloride: 83 mmol/L — ABNORMAL LOW (ref 98–111)
Chloride: 84 mmol/L — ABNORMAL LOW (ref 98–111)
Creatinine, Ser: 1.59 mg/dL — ABNORMAL HIGH (ref 0.61–1.24)
Creatinine, Ser: 1.53 mg/dL — ABNORMAL HIGH (ref 0.61–1.24)
GFR calc Af Amer: 48 mL/min — ABNORMAL LOW (ref >60)
GFR calc Af Amer: 51 mL/min — ABNORMAL LOW (ref >60)
GFR calc non Af Amer: 42 mL/min — ABNORMAL LOW (ref >60)
GFR calc non Af Amer: 44 mL/min — ABNORMAL LOW (ref >60)
GLUCOSE: 267 mg/dL — AB (ref 70–99)
Glucose, Bld: 96 mg/dL (ref 70–99)
Potassium: 4.2 mmol/L (ref 3.5–5.1)
Potassium: 4.7 mmol/L (ref 3.5–5.1)
Sodium: 116 mmol/L — CL (ref 135–145)
Sodium: 117 mmol/L — CL (ref 135–145)

## 2018-03-24 LAB — NA AND K (SODIUM & POTASSIUM), RAND UR
Potassium Urine: 39 mmol/L
Sodium, Ur: 69 mmol/L

## 2018-03-24 LAB — T4, FREE: Free T4: <0.25 ng/dL — ABNORMAL LOW (ref 0.82–1.77)

## 2018-03-24 LAB — CREATININE, URINE, RANDOM: Creatinine, Urine: 178.82 mg/dL

## 2018-03-24 LAB — URINALYSIS, ROUTINE W REFLEX MICROSCOPIC
Bacteria, UA: NONE SEEN
Bilirubin Urine: NEGATIVE
Glucose, UA: NEGATIVE mg/dL
Hgb urine dipstick: NEGATIVE
Ketones, ur: NEGATIVE mg/dL
Nitrite: NEGATIVE
Protein, ur: NEGATIVE mg/dL
Specific Gravity, Urine: 1.014 (ref 1.005–1.030)
pH: 6 (ref 5.0–8.0)

## 2018-03-24 LAB — CMP (CANCER CENTER ONLY)
ALBUMIN: 4.3 g/dL (ref 3.5–5.0)
ALT: 21 U/L (ref 0–44)
ANION GAP: 10 (ref 5–15)
AST: 68 U/L — ABNORMAL HIGH (ref 15–41)
Alkaline Phosphatase: 40 U/L (ref 38–126)
BUN: 16 mg/dL (ref 8–23)
CO2: 22 mmol/L (ref 22–32)
Calcium: 9.2 mg/dL (ref 8.9–10.3)
Chloride: 84 mmol/L — ABNORMAL LOW (ref 98–111)
Creatinine: 1.6 mg/dL — ABNORMAL HIGH (ref 0.61–1.24)
GFR, Est AFR Am: 48 mL/min — ABNORMAL LOW (ref >60)
GFR, Estimated: 42 mL/min — ABNORMAL LOW (ref >60)
Glucose, Bld: 83 mg/dL (ref 70–99)
Potassium: 4.2 mmol/L (ref 3.5–5.1)
SODIUM: 116 mmol/L — AB (ref 135–145)
Total Bilirubin: 0.7 mg/dL (ref 0.3–1.2)
Total Protein: 7.5 g/dL (ref 6.5–8.1)

## 2018-03-24 LAB — OSMOLALITY: Osmolality: 245 mOsm/kg — CL (ref 275–295)

## 2018-03-24 LAB — MRSA PCR SCREENING: MRSA by PCR: NEGATIVE

## 2018-03-24 LAB — CORTISOL: Cortisol, Plasma: 2.4 ug/dL

## 2018-03-24 LAB — OSMOLALITY, URINE: Osmolality, Ur: 460 mOsm/kg (ref 300–900)

## 2018-03-24 MED ORDER — SODIUM CHLORIDE 0.9 % IV SOLN
Freq: Once | INTRAVENOUS | Status: DC
  Filled 2018-03-24: qty 250

## 2018-03-24 MED ORDER — COSYNTROPIN 0.25 MG IJ SOLR
0.2500 mg | Freq: Once | INTRAMUSCULAR | Status: AC
  Administered 2018-03-25: 0.25 mg via INTRAVENOUS
  Filled 2018-03-24: qty 0.25

## 2018-03-24 MED ORDER — PANTOPRAZOLE SODIUM 40 MG PO TBEC: 40.0000 mg | DELAYED_RELEASE_TABLET | Freq: Every day | ORAL | Status: DC | PRN

## 2018-03-24 MED ORDER — DEXAMETHASONE SODIUM PHOSPHATE 10 MG/ML IJ SOLN
4.0000 mg | Freq: Two times a day (BID) | INTRAMUSCULAR | Status: DC
  Administered 2018-03-24 – 2018-03-26 (×5): 4 mg via INTRAVENOUS
  Filled 2018-03-24 (×5): qty 1

## 2018-03-24 MED ORDER — LEVOTHYROXINE SODIUM 100 MCG/5ML IV SOLN
50.0000 ug | Freq: Every day | INTRAVENOUS | Status: DC
  Administered 2018-03-24 – 2018-03-26 (×3): 50 ug via INTRAVENOUS
  Filled 2018-03-24 (×4): qty 5

## 2018-03-24 MED ORDER — ACETAMINOPHEN 325 MG PO TABS
650.0000 mg | ORAL_TABLET | Freq: Four times a day (QID) | ORAL | Status: DC | PRN
  Administered 2018-03-26: 650 mg via ORAL
  Filled 2018-03-24: qty 2

## 2018-03-24 MED ORDER — ASPIRIN 81 MG PO CHEW
81.0000 mg | CHEWABLE_TABLET | Freq: Every day | ORAL | Status: DC
  Administered 2018-03-25 – 2018-03-28 (×4): 81 mg via ORAL
  Filled 2018-03-24 (×4): qty 1

## 2018-03-24 MED ORDER — PREDNISONE 10 MG PO TABS: ORAL_TABLET | ORAL | 1 refills | Status: DC

## 2018-03-24 MED ORDER — SODIUM CHLORIDE 0.9 % IV SOLN
INTRAVENOUS | Status: DC
  Administered 2018-03-24 – 2018-03-26 (×4): via INTRAVENOUS

## 2018-03-24 MED ORDER — PROCHLORPERAZINE MALEATE 10 MG PO TABS: 10.0000 mg | ORAL_TABLET | Freq: Four times a day (QID) | ORAL | Status: DC | PRN

## 2018-03-24 MED ORDER — ENOXAPARIN SODIUM 40 MG/0.4ML ~~LOC~~ SOLN
40.0000 mg | SUBCUTANEOUS | Status: DC
  Administered 2018-03-24 – 2018-03-27 (×4): 40 mg via SUBCUTANEOUS
  Filled 2018-03-24 (×4): qty 0.4

## 2018-03-24 NOTE — ED Provider Notes (Signed)
[facility] DEPT Provider Note   CSN: [id number] Arrival date & time: 03/24/18  0944     History   Chief Complaint Chief Complaint  Patient presents with  . Abnormal Lab    HPI [name] is a 73 y.o. male.  HPI   73 year old male with hyponatremia.  He is sent from the cancer center for further evaluation.  He has a past history of metastatic renal cell carcinom sp nephrectomy.  On 03/15/2018 (9d ago) he had a sodium level of 136.  Just prior to this he had been mildly hyponatremic (129-134).  Labs checked 2 days ago and sodium levels 120 mmol/L.  Today it was 116 and repeat was 115 in the ED. Pt says "I don't feel too swift."  Feels very tired/fatigued.  Daughter at bedside states she has noticed the same.  He states that he is very slow to answer questions at times but not necessarily confused. Appetite has been poor. Only had 1-2 Boosts yesterday. No unusual swelling.   Past Medical History:  Diagnosis Date  . Arthritis   . Chronic kidney disease    only has one kidney   . 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 02/01/2015  . Coronary artery disease    followed by Dr.Tilley  . GERD (gastroesophageal reflux disease)   . Heart murmur   . High cholesterol   . Hx of cancer of lung 1980's  . Hypertension   . Incisional hernia 08/01/2015  . Lung cancer ([place])   . Recurrent umbilical hernia 0/86/5784  . Right renal mass     Patient Active Problem List   Diagnosis Date Noted  . Pericardial effusion   . Essential hypertension   . Acute renal failure superimposed on stage 3 chronic kidney disease ([place])   . Protein-calorie malnutrition, severe 03/14/2018  . Generalized weakness 03/13/2018  . Hyperlipidemia 11/30/2017  . Kidney cancer, primary, with metastasis from kidney to other site [facility]) 09/14/2017  . Goals of care, counseling/discussion 09/14/2017  . Recurrent umbilical hernia 69/62/9528  . Incisional hernia  08/01/2015  . Hypertensive heart disease without CHF   . GERD (gastroesophageal reflux disease)   . Chronic kidney disease   . Coronary artery disease   . 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 02/01/2015    Past Surgical History:  Procedure Laterality Date  . APPENDECTOMY  age 28  . CHOLECYSTECTOMY  1980's  . LAPAROSCOPIC LYSIS OF ADHESIONS N/A 08/01/2015   Procedure: LAPAROSCOPIC LYSIS OF ADHESIONS;  Surgeon: Michael Boston, MD;  Location: [facility] ORS;  Service: General;  Laterality: N/A;  . LUNG LOBECTOMY  1992   lung cancer- patient has staples in lung not to have MRI per patient   . PILONIDAL CYST EXCISION  2011   Dr Harlow Asa  . ROBOTIC ASSITED PARTIAL NEPHRECTOMY Right 02/01/2015   Procedure: RIGHT ROBOTIC ASSISTED LAPAROSOCOPY NEPHRECTOMY;  Surgeon: Ardis Hughs, MD;  Location: [facility];  Service: Urology;  Laterality: Right;  . VENTRAL HERNIA REPAIR N/A 08/01/2015   Procedure: LAPAROSCOPIC VENTRAL WALL HERNIA WITH MESH;  Surgeon: Michael Boston, MD;  Location: [facility];  Service: General;  Laterality: N/A;        Home Medications    Prior to Admission medications   Medication Sig Start Date End Date Taking? Authorizing Provider  acetaminophen (TYLENOL) 325 MG tablet Take 2 tablets (650 mg total) by mouth every 6 (six) hours as needed for mild pain or headache (  over the counter). 03/16/18   Rai, Vernelle Emerald, MD  aspirin 81 MG chewable tablet Chew 81 mg by mouth daily.    [provider]  lisinopril (PRINIVIL,ZESTRIL) 20 MG tablet  11/01/17   [provider]  Multiple Vitamin (MULTIVITAMIN WITH MINERALS) TABS tablet Take 1 tablet by mouth daily. 03/16/18   Rai, Ripudeep Raliegh Ip, MD  pantoprazole (PROTONIX) 40 MG tablet Take 1 tablet (40 mg total) by mouth daily at 6 (six) AM. 03/16/18   Rai, Ripudeep K, MD  predniSONE (DELTASONE) 10 MG tablet Take 6 tablets for a week then,  5 tablets for a week 4 tablets for week 3 tablets for a week 2 tablets for  a week 1 tablet for a week.  Then stop 03/24/18   Wyatt Portela, MD  prochlorperazine (COMPAZINE) 10 MG tablet Take 1 tablet (10 mg total) by mouth every 6 (six) hours as needed for nausea or vomiting. 03/16/18   Rai, Ripudeep K, MD  temazepam (RESTORIL) 7.5 MG capsule Take 1 capsule (7.5 mg total) by mouth at bedtime as needed for sleep. 03/22/18   Harle Stanford., PA-C    Family History Family History  Problem Relation Age of Onset  . Heart attack Father     Social History Social History   Tobacco Use  . Smoking status: Former Smoker    Packs/day: 1.00    Years: 20.00    Pack years: 20.00    Last attempt to quit: 01/29/1988    Years since quitting: 30.1  . Smokeless tobacco: Never Used  Substance Use Topics  . Alcohol use: Yes    Comment: rare  . Drug use: No     Allergies   Benadryl [diphenhydramine]   Review of Systems Review of Systems  All systems reviewed and negative, other than as noted in HPI.  Physical Exam Updated Vital Signs BP 92/66 (BP Location: Right Arm)   Pulse (!) 55   Resp 16   Wt 85.7 kg   SpO2 96%   BMI 27.91 kg/m   Physical Exam Vitals signs and nursing note reviewed.  Constitutional:      General: He is not in acute distress.    Appearance: He is well-developed.  HENT:     Head: Normocephalic and atraumatic.  Eyes:     General:        Right eye: No discharge.        Left eye: No discharge.     Conjunctiva/sclera: Conjunctivae normal.  Neck:     Musculoskeletal: Neck supple.  Cardiovascular:     Rate and Rhythm: Normal rate and regular rhythm.     Heart sounds: Normal heart sounds. No murmur. No friction rub. No gallop.   Pulmonary:     Effort: Pulmonary effort is normal. No respiratory distress.     Breath sounds: Normal breath sounds.  Abdominal:     General: There is no distension.     Palpations: Abdomen is soft.     Tenderness: There is no abdominal tenderness.  Musculoskeletal:        General: No tenderness.  Skin:     General: Skin is warm and dry.  Neurological:     Mental Status: He is alert.     Comments: Slow to answer questions but does so appropriately. Mild global weakness. No focal motor deficits.   Psychiatric:        Behavior: Behavior normal.        Thought Content: Thought content normal.  ED Treatments / Results  Labs (all labs ordered are listed, but only abnormal results are displayed) Labs Reviewed  COMPREHENSIVE METABOLIC PANEL - Abnormal; Notable for the following components:      Result Value   Sodium 115 (*)    Chloride 81 (*)    Creatinine, Ser 1.59 (*)    Calcium 8.8 (*)    AST 69 (*)    Alkaline Phosphatase 35 (*)    GFR calc non Af Amer 42 (*)    GFR calc Af Amer 48 (*)    All other components within normal limits  OSMOLALITY - Abnormal; Notable for the following components:   Osmolality 245 (*)    All other components within normal limits  TSH - Abnormal; Notable for the following components:   TSH 86.397 (*)    All other components within normal limits  URINALYSIS, ROUTINE W REFLEX MICROSCOPIC - Abnormal; Notable for the following components:   Leukocytes, UA TRACE (*)    All other components within normal limits  CBC WITH DIFFERENTIAL/PLATELET - Abnormal; Notable for the following components:   Hemoglobin 12.5 (*)    HCT 35.3 (*)    Eosinophils Absolute 0.8 (*)    All other components within normal limits  T3, FREE - Abnormal; Notable for the following components:   T3, Free 0.5 (*)    All other components within normal limits  T4, FREE - Abnormal; Notable for the following components:   Free T4 <0.25 (*)    All other components within normal limits  BASIC METABOLIC PANEL - Abnormal; Notable for the following components:   Sodium 116 (*)    Chloride 83 (*)    Creatinine, Ser 1.59 (*)    GFR calc non Af Amer 42 (*)    GFR calc Af Amer 48 (*)    All other components within normal limits  BASIC METABOLIC PANEL - Abnormal; Notable for the following  components:   Sodium 117 (*)    Chloride 84 (*)    CO2 21 (*)    Glucose, Bld 267 (*)    Creatinine, Ser 1.53 (*)    Calcium 8.7 (*)    GFR calc non Af Amer 44 (*)    GFR calc Af Amer 51 (*)    All other components within normal limits  BASIC METABOLIC PANEL - Abnormal; Notable for the following components:   Sodium 120 (*)    Potassium 5.2 (*)    Chloride 89 (*)    CO2 19 (*)    Glucose, Bld 168 (*)    Creatinine, Ser 1.55 (*)    GFR calc non Af Amer 43 (*)    GFR calc Af Amer 50 (*)    All other components within normal limits  BASIC METABOLIC PANEL - Abnormal; Notable for the following components:   Sodium 123 (*)    Chloride 92 (*)    CO2 19 (*)    Glucose, Bld 147 (*)    Creatinine, Ser 1.59 (*)    Calcium 8.8 (*)    GFR calc non Af Amer 42 (*)    GFR calc Af Amer 48 (*)    All other components within normal limits  BASIC METABOLIC PANEL - Abnormal; Notable for the following components:   Sodium 124 (*)    Chloride 92 (*)    Glucose, Bld 167 (*)    Creatinine, Ser 1.64 (*)    Calcium 8.8 (*)    GFR calc non Af Wyvonnia Lora  40 (*)    GFR calc Af Amer 47 (*)    All other components within normal limits  CBC - Abnormal; Notable for the following components:   WBC 16.0 (*)    RBC 4.12 (*)    Hemoglobin 11.7 (*)    HCT 34.5 (*)    All other components within normal limits  BASIC METABOLIC PANEL - Abnormal; Notable for the following components:   Sodium 129 (*)    Chloride 97 (*)    Glucose, Bld 143 (*)    Creatinine, Ser 1.70 (*)    GFR calc non Af Amer 39 (*)    GFR calc Af Amer 45 (*)    All other components within normal limits  CBC - Abnormal; Notable for the following components:   WBC 14.9 (*)    RBC 3.80 (*)    Hemoglobin 10.9 (*)    HCT 33.6 (*)    All other components within normal limits  BASIC METABOLIC PANEL - Abnormal; Notable for the following components:   Glucose, Bld 127 (*)    Creatinine, Ser 1.66 (*)    Calcium 8.7 (*)    GFR calc non Af Amer  40 (*)    GFR calc Af Amer 46 (*)    All other components within normal limits  MRSA PCR SCREENING  OSMOLALITY, URINE  CORTISOL  CREATININE, URINE, RANDOM  NA AND K (SODIUM & POTASSIUM), RAND UR  UREA NITROGEN, URINE  ACTH STIMULATION, 3 TIME POINTS    EKG None  Radiology No results found.  Procedures Procedures (including critical care time)  CRITICAL CARE Performed by: Virgel Manifold Total critical care time: 35 minutes Critical care time was exclusive of separately billable procedures and treating other patients. Critical care was necessary to treat or prevent imminent or life-threatening deterioration. Critical care was time spent personally by me on the following activities: development of treatment plan with patient and/or surrogate as well as nursing, discussions with consultants, evaluation of patient's response to treatment, examination of patient, obtaining history from patient or surrogate, ordering and performing treatments and interventions, ordering and review of laboratory studies, ordering and review of radiographic studies, pulse oximetry and re-evaluation of patient's condition.   Medications Ordered in ED Medications - No data to display   Initial Impression / Assessment and Plan / ED Course  I have reviewed the triage vital signs and the nursing notes.  Pertinent labs & imaging results that were available during my care of the patient were reviewed by me and considered in my medical decision making (see chart for details).    I have reviewed the triage vital signs and the nursing notes. Prior records were reviewed for additional information.    Pertinent labs & imaging results that were available during my care of the patient were reviewed by me and considered in my medical decision making (see chart for details).  73 year old male with worsening hyponatremia which seems to coincide with worsening fatigue.  He is is drowsy on exam and somewhat slow to  respond to questioning but does not seem overtly encephalopathic.  No reported seizure activity.  He is not on a thiazide, antidepressant or other obvious offending medication.  Will fluid restrict while in the emergency room.  No seizures and he does not seem overly confused at this point.  Hypertonic saline deferred.  Additional lab and urine studies are sent.  Admission for ongoing treatment/evaluation.  Final Clinical Impressions(s) / ED Diagnoses   Final diagnoses:  Hyponatremia  Acute confusional state    ED Discharge Orders    None       Virgel Manifold, MD 03/30/18 1301

## 2018-03-24 NOTE — ED Notes (Signed)
ED TO INPATIENT HANDOFF REPORT  Name/Age/Gender [name] 73 y.o. male  Code Status Code Status History    Date Active Date Inactive Code Status Order ID Comments User Context   03/13/2018 2341 03/16/2018 1541 Full Code [id number]  Colbert Ewing, MD ED   02/01/2015 1411 02/05/2015 1635 Full Code [id number]  Debbrah Alar, PA-C Inpatient    Advance Directive Documentation     Most Recent Value  Type of Advance Directive  Healthcare Power of [place], Living will  Pre-existing out of facility DNR order (yellow form or pink MOST form)  -  "MOST" Form in Place?  -      Home/SNF/Other Home  Chief Complaint low sodium  Level of Care/Admitting Diagnosis ED Disposition    ED Disposition Condition [place]: [place] [100102]  Level of Care: Stepdown [14]  Admit to SDU based on following criteria: Severe physiological/psychological symptoms:  Any diagnosis requiring assessment & intervention at least every 4 hours on an ongoing basis to obtain desired patient outcomes including stability and rehabilitation  Diagnosis: Hyponatremia [253664]  Admitting Physician: Caren Griffins [[id number]]  Attending Physician: Caren Griffins [phone]  Estimated length of stay: past midnight tomorrow  Certification:: I certify this patient will need inpatient services for at least 2 midnights  PT Class (Do Not Modify): Inpatient [101]  PT Acc Code (Do Not Modify): Private [1]       Medical History Past Medical History:  Diagnosis Date  . Arthritis   . Chronic kidney disease    only has one kidney   . 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 02/01/2015  . Coronary artery disease    followed by Dr.Tilley  . GERD (gastroesophageal reflux disease)   . Heart murmur   . High cholesterol   . Hx of cancer of lung 1980's  . Hypertension   . Incisional hernia 08/01/2015  . Lung cancer ([place])   . Recurrent umbilical hernia 7/42/5956   . Right renal mass     Allergies Allergies  Allergen Reactions  . Benadryl [Diphenhydramine] Other (See Comments)    Severe restless legs    IV Location/Drains/Wounds Patient Lines/Drains/Airways Status   Active Line/Drains/Airways    Name:   Placement date:   Placement time:   Site:   Days:   Peripheral IV 03/24/18 Left Antecubital   03/24/18    0927    Antecubital   less than 1   Incision (Closed) 02/01/15 Abdomen Medial;Anterior;Upper   02/01/15    0826     1147   Incision (Closed) 08/01/15 Abdomen Other (Comment)   08/01/15    0845     966   Incision - 1 Port    -    -        Incision - 3 Ports Abdomen Left;Lateral;Lower 2: Left;Lateral;Mid 3: Left;Lateral;Upper   08/01/15    0805     966   Incision - 5 Ports Abdomen 1: Right;Lateral;Upper 2: Right;Lateral;Upper 3: Right;Lateral;Mid 4: Right;Lateral;Lower 5: Right;Lateral;Lower   02/01/15    0826     1147          Labs/Imaging Results for orders placed or performed during the hospital encounter of 03/24/18 (from the past 48 hour(s))  Comprehensive metabolic panel     Status: Abnormal   Collection Time: 03/24/18 10:39 AM  Result Value Ref Range   Sodium 115 (LL) 135 - 145 mmol/L    Comment:  CRITICAL RESULT CALLED TO, READ BACK BY AND VERIFIED WITH: BINGHAM,S @ 1114 ON [id number] BY POTEAT,S    Potassium 4.0 3.5 - 5.1 mmol/L   Chloride 81 (L) 98 - 111 mmol/L   CO2 24 22 - 32 mmol/L   Glucose, Bld 79 70 - 99 mg/dL   BUN 19 8 - 23 mg/dL   Creatinine, Ser 1.59 (H) 0.61 - 1.24 mg/dL   Calcium 8.8 (L) 8.9 - 10.3 mg/dL   Total Protein 7.6 6.5 - 8.1 g/dL   Albumin 4.6 3.5 - 5.0 g/dL   AST 69 (H) 15 - 41 U/L   ALT 22 0 - 44 U/L   Alkaline Phosphatase 35 (L) 38 - 126 U/L   Total Bilirubin 0.8 0.3 - 1.2 mg/dL   GFR calc non Af Amer 42 (L) >60 mL/min   GFR calc Af Amer 48 (L) >60 mL/min   Anion gap 10 5 - 15    Comment: Performed at [facility], [place] [address], [place], [place] [address]  Osmolality      Status: Abnormal   Collection Time: 03/24/18 10:39 AM  Result Value Ref Range   Osmolality 245 (LL) 275 - 295 mOsm/kg    Comment: REPEATED TO VERIFY CRITICAL RESULT CALLED TO, READ BACK BY AND VERIFIED WITH: M.Karita Dralle,RN 1216 03/24/2018 CLARK,S Performed at [facility], [place] [address], [place], [place] [address]   TSH     Status: Abnormal   Collection Time: 03/24/18 10:39 AM  Result Value Ref Range   TSH 86.397 (H) 0.350 - 4.500 uIU/mL    Comment: Performed by a 3rd Generation assay with a functional sensitivity of <=0.01 uIU/mL. Performed at [facility], [place] [address], [place], [place] [address]   Cortisol     Status: None   Collection Time: 03/24/18 10:39 AM  Result Value Ref Range   Cortisol, Plasma 2.4 ug/dL    Comment: (NOTE) AM    6.7 - 22.6 ug/dL PM   <10.0       ug/dL Performed at [place] [address], [place], [place] [address]   CBC with Differential     Status: Abnormal   Collection Time: 03/24/18 10:39 AM  Result Value Ref Range   WBC 6.5 4.0 - 10.5 K/uL   RBC 4.26 4.22 - 5.81 MIL/uL   Hemoglobin 12.5 (L) 13.0 - 17.0 g/dL   HCT 35.3 (L) 39.0 - 52.0 %   MCV 82.9 80.0 - 100.0 fL   MCH 29.3 26.0 - 34.0 pg   MCHC 35.4 30.0 - 36.0 g/dL   RDW 13.5 11.5 - 15.5 %   Platelets 252 150 - 400 K/uL   nRBC 0.0 0.0 - 0.2 %   Neutrophils Relative % 46 %   Neutro Abs 3.0 1.7 - 7.7 K/uL   Lymphocytes Relative 34 %   Lymphs Abs 2.2 0.7 - 4.0 K/uL   Monocytes Relative 6 %   Monocytes Absolute 0.4 0.1 - 1.0 K/uL   Eosinophils Relative 13 %   Eosinophils Absolute 0.8 (H) 0.0 - 0.5 K/uL   Basophils Relative 0 %   Basophils Absolute 0.0 0.0 - 0.1 K/uL   Immature Granulocytes 1 %   Abs Immature Granulocytes 0.03 0.00 - 0.07 K/uL    Comment: Performed at [facility], [place] [address] [place], [place] [address]  Osmolality, urine     Status: None   Collection Time: 03/24/18 11:03 AM  Result Value  Ref Range   Osmolality,  Ur 460 300 - 900 mOsm/kg    Comment: Performed at [facility], [place] [address], [place], [place] [address]  Urinalysis, Routine w reflex microscopic     Status: Abnormal   Collection Time: 03/24/18 11:03 AM  Result Value Ref Range   Color, Urine YELLOW YELLOW   APPearance CLEAR CLEAR   Specific Gravity, Urine 1.014 1.005 - 1.030   pH 6.0 5.0 - 8.0   Glucose, UA NEGATIVE NEGATIVE mg/dL   Hgb urine dipstick NEGATIVE NEGATIVE   Bilirubin Urine NEGATIVE NEGATIVE   Ketones, ur NEGATIVE NEGATIVE mg/dL   Protein, ur NEGATIVE NEGATIVE mg/dL   Nitrite NEGATIVE NEGATIVE   Leukocytes, UA TRACE (A) NEGATIVE   WBC, UA 0-5 0 - 5 WBC/hpf   Bacteria, UA NONE SEEN NONE SEEN   Squamous Epithelial / LPF 0-5 0 - 5   Mucus PRESENT    Hyaline Casts, UA PRESENT     Comment: Performed at [facility], [place] [address], [place], [place] [address]  Creatinine, urine, random     Status: None   Collection Time: 03/24/18 11:03 AM  Result Value Ref Range   Creatinine, Urine 178.82 mg/dL    Comment: Performed at [facility], [place] [address], [place],  [address]  Na and K (sodium & potassium), rand urine     Status: None   Collection Time: 03/24/18 11:03 AM  Result Value Ref Range   Sodium, Ur 69 mmol/L   Potassium Urine 39 mmol/L    Comment: Performed at [facility], [place] [address], [place],  [address]   No results found. None  Pending Labs Unresulted Labs (From admission, onward)    Start     Ordered   03/25/18 0500  ACTH stimulation, 3 time points  (cosyntropin (CORTROSYN) test and labs)  Tomorrow morning,   R     03/24/18 1256   03/24/18 1208  T3, free  Once,   R     03/24/18 1207   03/24/18 1208  T4, free  Once,   R     03/24/18 1207   03/24/18 1025  Urea nitrogen, urine  Once,   R     03/24/18 1025   Signed and Held  Basic metabolic panel  Now then every 6 hours,   R     Signed and Held           Vitals/Pain Today's Vitals   03/24/18 1400 03/24/18 1430 03/24/18 1500 03/24/18 1600  BP: 137/85 137/90 135/80 (!) 151/90  Pulse: 64 68 66 72  Resp: 13 12 18 11   Temp:      TempSrc:      SpO2: 93% 91% 90% 94%  Weight:      PainSc:        Isolation Precautions No active isolations  Medications Medications  0.9 %  sodium chloride infusion ( Intravenous New Bag/Given 03/24/18 1040)  dexamethasone (DECADRON) injection 4 mg (4 mg Intravenous Given 03/24/18 1327)  levothyroxine (SYNTHROID, LEVOTHROID) injection 50 mcg (50 mcg Intravenous Given 03/24/18 1437)  cosyntropin (CORTROSYN) injection 0.25 mg (has no administration in time range)    Mobility non-ambulatory

## 2018-03-24 NOTE — Progress Notes (Signed)
After patient was transported via wheelchair to the infusion room for IV fluids the sodium level resulted at 116. Verbal order from Dr. Alen Blew to transport patient to the ED. Contacted the ED charge RN and received room 11 assignment. Contacted patient daughter and left message that patient was being transported to the ED. Patient transported to the ED and report given to [facility]. Was then able to contact daughter [name] and made aware that patient was in the ED room 11.

## 2018-03-24 NOTE — H&P (Signed)
History and Physical    [name] ZOX:[id number] DOB: Jan 14, 1946 DOA: 03/24/2018  I have briefly reviewed the patient's prior medical records in [place]  PCP: [name], MD  Patient coming from: home via oncology office   Chief Complaint: Weakness, low sodium  HPI: [name] is a 73 y.o. male with medical history significant of stage IV renal cell carcinoma, initially localized and diagnosed in 2016, status post radical nephrectomy 2016, now with stage IV with omental mets as well as retrocaval lymph node involvement, and also has potentially metastatic lesion in the liver. On most recent CT scan in January 2020 did not show any progression of the disease.  He also received nivolumab as well as Iiplimumab, completed 4 cycles of therapy.  Also has a history of coronary artery disease, hyperlipidemia, hypertension.  He presents today in the emergency room sent by his primary oncologist Dr. Alen Blew from office after being found to be hyponatremic with a sodium of 116.  Patient was recently admitted to the hospital and discharged about a week ago for acute kidney injury superimposed on chronic kidney disease stage III, hypertension, generalized weakness with failure to thrive, felt to be multifactorial due to immunotherapy, metastatic cancer, he was hydrated and he improved and was discharged home.  He was also found to have a pericardial effusion for which cardiology was consulted and felt like his effusion is likely malignant in the setting of metastatic cancer however too small for pericardiocentesis and recommended follow-up with repeat echo in 1 to 2 weeks.  Since his discharge on 03/16/2018 he was seen oncology clinic few days ago he was found to be weaker, was found to have a sodium of 120 and was given normal saline.  He was seen today for repeat visit, and sodium was 116 and he was sent here.  Patient currently complains of weakness but otherwise denies any fever or chills,  denies any chest pain or shortness of breath.  He did report mild nausea but no vomiting.  He denies any diarrhea.  Daughter is at bedside and tells me that he has had very poor p.o. intake since his discharge, has been having balance issues but denies any falls.  ED Course: In the emergency room his vital signs are stable, his blood work is pertinent for a sodium of 115.  His creatinine is 1.6.  Review of Systems: As per HPI otherwise 10 point review of systems negative.   Past Medical History:  Diagnosis Date  . Arthritis   . Chronic kidney disease    only has one kidney   . 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 02/01/2015  . Coronary artery disease    followed by Dr.Tilley  . GERD (gastroesophageal reflux disease)   . Heart murmur   . High cholesterol   . Hx of cancer of lung 1980's  . Hypertension   . Incisional hernia 08/01/2015  . Lung cancer ([facility])   . Recurrent umbilical hernia 09/26/9145  . Right renal mass     Past Surgical History:  Procedure Laterality Date  . APPENDECTOMY  age 58  . CHOLECYSTECTOMY  1980's  . LAPAROSCOPIC LYSIS OF ADHESIONS N/A 08/01/2015   Procedure: LAPAROSCOPIC LYSIS OF ADHESIONS;  Surgeon: Michael Boston, MD;  Location: [facility];  Service: General;  Laterality: N/A;  . LUNG LOBECTOMY  1992   lung cancer- patient has staples in lung not to have MRI per patient   . PILONIDAL CYST EXCISION  2011   Dr Harlow Asa  . ROBOTIC ASSITED PARTIAL NEPHRECTOMY Right 02/01/2015   Procedure: RIGHT ROBOTIC ASSISTED LAPAROSOCOPY NEPHRECTOMY;  Surgeon: Ardis Hughs, MD;  Location: [facility];  Service: Urology;  Laterality: Right;  . VENTRAL HERNIA REPAIR N/A 08/01/2015   Procedure: LAPAROSCOPIC VENTRAL WALL HERNIA WITH MESH;  Surgeon: Michael Boston, MD;  Location: [facility];  Service: General;  Laterality: N/A;     reports that he quit smoking about 30 years ago. He has a 20.00 pack-year smoking history. He has never used smokeless tobacco. He  reports current alcohol use. He reports that he does not use drugs.  Allergies  Allergen Reactions  . Benadryl [Diphenhydramine] Other (See Comments)    Severe restless legs    Family History  Problem Relation Age of Onset  . Heart attack Father     Prior to Admission medications   Medication Sig Start Date End Date Taking? Authorizing Provider  acetaminophen (TYLENOL) 325 MG tablet Take 2 tablets (650 mg total) by mouth every 6 (six) hours as needed for mild pain or headache (over the counter). 03/16/18  Yes Rai, Ripudeep Raliegh Ip, MD  aspirin 81 MG chewable tablet Chew 81 mg by mouth daily.   Yes [provider]  Multiple Vitamin (MULTIVITAMIN WITH MINERALS) TABS tablet Take 1 tablet by mouth daily. 03/16/18  Yes Rai, Ripudeep K, MD  pantoprazole (PROTONIX) 40 MG tablet Take 1 tablet (40 mg total) by mouth daily at 6 (six) AM. Patient taking differently: Take 40 mg by mouth daily as needed (reflux).  03/16/18  Yes Rai, Ripudeep K, MD  prochlorperazine (COMPAZINE) 10 MG tablet Take 1 tablet (10 mg total) by mouth every 6 (six) hours as needed for nausea or vomiting. 03/16/18  Yes Rai, Ripudeep K, MD  temazepam (RESTORIL) 7.5 MG capsule Take 1 capsule (7.5 mg total) by mouth at bedtime as needed for sleep. 03/22/18  Yes Tanner, Lyndon Code., PA-C  lisinopril (PRINIVIL,ZESTRIL) 20 MG tablet  11/01/17   [provider]  predniSONE (DELTASONE) 10 MG tablet Take 6 tablets for a week then,  5 tablets for a week 4 tablets for week 3 tablets for a week 2 tablets for a week 1 tablet for a week.  Then stop 03/24/18   Wyatt Portela, MD    Physical Exam: Vitals:   03/24/18 0952 03/24/18 1017 03/24/18 1037 03/24/18 1100  BP:   110/74 (!) 148/85  Pulse:   (!) 58 60  Resp:   13 14  Temp:  (!) 96 F (35.6 C)    TempSrc:  Rectal    SpO2:   97% 95%  Weight: 85.7 kg       Constitutional: NAD, calm, comfortable, a bit lethargic but appropriate Eyes: PERRL, lids and conjunctivae normal ENMT:  Mucous membranes are dry Neck: normal, supple Respiratory: clear to auscultation bilaterally, no wheezing, no crackles. Normal respiratory effort. No accessory muscle use.  Cardiovascular: Regular rate and rhythm, no murmurs / rubs / gallops. No extremity edema. 2+ pedal pulses.  Abdomen: no tenderness, no masses palpated. Bowel sounds positive.  Musculoskeletal: no clubbing / cyanosis.  Skin: no rashes Neurologic: CN 2-12 grossly intact. Strength 5/5 in all 4.  Psychiatric: Normal judgment and insight. Alert and oriented x 3. Normal mood.   Labs on Admission: I have personally reviewed following labs and imaging studies  CBC: Recent Labs  Lab 03/22/18 1327 03/24/18 0818 03/24/18 1039  WBC 9.1 6.3 6.5  NEUTROABS 5.4 3.0 3.0  HGB  12.1* 12.8* 12.5*  HCT 35.1* 35.1* 35.3*  MCV 82.2 79.4* 82.9  PLT 254 246 010   Basic Metabolic Panel: Recent Labs  Lab 03/22/18 1327 03/24/18 0818 03/24/18 1039  NA 120* 116* 115*  K 4.0 4.2 4.0  CL 86* 84* 81*  CO2 26 22 24   GLUCOSE 108* 83 79  BUN 22 16 19   CREATININE 1.84* 1.60* 1.59*  CALCIUM 9.4 9.2 8.8*   GFR: Estimated Creatinine Clearance: 43.5 mL/min (A) (by C-G formula based on SCr of 1.59 mg/dL (H)). Liver Function Tests: Recent Labs  Lab 03/22/18 1327 03/24/18 0818 03/24/18 1039  AST 58* 68* 69*  ALT 16 21 22   ALKPHOS 45 40 35*  BILITOT 0.8 0.7 0.8  PROT 7.6 7.5 7.6  ALBUMIN 4.2 4.3 4.6   No results for input(s): LIPASE, AMYLASE in the last 168 hours. No results for input(s): AMMONIA in the last 168 hours. Coagulation Profile: No results for input(s): INR, PROTIME in the last 168 hours. Cardiac Enzymes: No results for input(s): CKTOTAL, CKMB, CKMBINDEX, TROPONINI in the last 168 hours. BNP (last 3 results) No results for input(s): PROBNP in the last 8760 hours. HbA1C: No results for input(s): HGBA1C in the last 72 hours. CBG: No results for input(s): GLUCAP in the last 168 hours. Lipid Profile: No results for  input(s): CHOL, HDL, LDLCALC, TRIG, CHOLHDL, LDLDIRECT in the last 72 hours. Thyroid Function Tests: Recent Labs    03/24/18 0818  TSH 66.300*   Anemia Panel: No results for input(s): VITAMINB12, FOLATE, FERRITIN, TIBC, IRON, RETICCTPCT in the last 72 hours. Urine analysis:    Component Value Date/Time   COLORURINE YELLOW 03/24/2018 1103   APPEARANCEUR CLEAR 03/24/2018 1103   LABSPEC 1.014 03/24/2018 1103   PHURINE 6.0 03/24/2018 1103   GLUCOSEU NEGATIVE 03/24/2018 1103   HGBUR NEGATIVE 03/24/2018 1103   BILIRUBINUR NEGATIVE 03/24/2018 1103   KETONESUR NEGATIVE 03/24/2018 1103   PROTEINUR NEGATIVE 03/24/2018 1103   UROBILINOGEN 0.2 11/12/2009 1000   NITRITE NEGATIVE 03/24/2018 1103   LEUKOCYTESUR TRACE (A) 03/24/2018 1103     Radiological Exams on Admission: No results found.  Assessment/Plan Principal Problem:   Hyponatremia Active Problems:   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   Protein-calorie malnutrition, severe   Essential hypertension   Principal Problem Hyponatremia in the setting of profound hypothyroidism -Patient looks dry on exam, he has poor p.o. intake, suspect a component of dehydration possibly SIADH.  Will admit to stepdown, give gentle normal saline and monitor sodium levels every 6 hours. -Serum osmolality, urine osmolality, urine sodium, TSH, cortisol... TSH is just resulted while writing this note and seems to be elevated at 86.  He is clearly hypothyroid clinically with some lethargy, hypothermia, bradycardia as well as hyponatremia.  Start IV Synthroid at 71 MCG, closely monitor and increase ideally to oral weight-based dose of 100 MCG -Also placed on IV steroids, placed on Decadron for now while awaiting cortisol as it would be possible to do a stim test in case cortisol is significantly low -We will also placed on fluid/free water restriction  Active Problems Chronic kidney disease stage III -Baseline creatinine  1.8-2 -Currently creatinine a little bit better than baseline, continue to closely monitor, hold lisinopril and avoid nephrotoxic agents  Metastatic renal cell cancer -Discussed with Dr. Alen Blew over the phone, he will follow patient while hospitalized, will obtain an MRI of the brain given increased worsening of his balance  Hypertension -For now favor to hold  lisinopril, continue to closely monitor blood pressure  Small pericardial effusion -Found on previous hospitalization, possibly in the setting of hypothyroidism  Mild anemia -In the setting of malignancy, no bleeding  ?  Immune mediated complications -Per Dr. Hazeline Junker notes from clinic, he is wondering whether his symptoms may have a component of delayed toxicity from immunotherapy and recommended to be started on prednisone.  Patient is on Decadron as mentioned above already   DVT prophylaxis: Lovenox Code Status: Full code Family Communication: Family present at bedside (daughter) Disposition Plan: Admit to stepdown, discharge TBD Consults called: Oncology, Dr. Alen Blew   Marzetta Board, MD, PhD [facility]  Contact via [email or web address]  [facility] Office Info P: [phone]  F: [phone]   03/24/2018, 11:51 AM

## 2018-03-24 NOTE — Progress Notes (Signed)
Communicated to Infusion RN that patient will not receive Opdivo today and will receive IV fluids instead.

## 2018-03-24 NOTE — ED Notes (Signed)
CRITICAL VALUE STICKER  CRITICAL VALUE:  RECEIVER (on-site recipient of call):  DATE & TIME NOTIFIED: 03/24/2018 @1114   MD NOTIFIED: Wilson Singer MD  TIME OF NOTIFICATION:10/23/2018 @1116   RESPONSE: See new orders

## 2018-03-24 NOTE — Progress Notes (Signed)
Attempted to get pt for MRI. Pt states the past two previous MRI's he has attempted he had to be pulled out of the scanner due to the clips in his chest from heart sx. He states it starts to feel like his heart is burning. Pt refused MRI. Will notify RN.

## 2018-03-24 NOTE — ED Notes (Signed)
Bed: XG52 Expected date:  Expected time:  Means of arrival:  Comments: [place] [facility]

## 2018-03-24 NOTE — ED Triage Notes (Signed)
Patient brought into ED in wheelchair by Seth Bake, [place] center nurse. Patient is being treated at [facility] center for lung cancer, and arrived today for his scheduled treatment. Patient labs were drawn and found to have a sodium of 116. Patient is noted to be lethargic to cancer center staff. Patient has 22g PIV to left [facility] started by Cancer center staff.

## 2018-03-24 NOTE — Progress Notes (Signed)
After IV was placed, desk nurse transported patient to the ED.

## 2018-03-24 NOTE — Progress Notes (Addendum)
Hematology and Oncology Follow Up Visit  [name] [id number] 1945/02/26 73 y.o. 03/24/2018 8:40 AM [name], MDElkins, [name], *   Principle Diagnosis: 73 year old man with stage IV renal cell carcinoma developed in December 2018.  He was initially diagnosed in 2016 with localized disease.  Prior Therapy: He underwent a radical nephrectomy completed by Dr. Louis Meckel on February 01, 2015.  The final pathology revealed a 3.3 cm clear cell renal cell carcinoma with Fuhrman grade 3.  Margins were negative indicating stage T1 a disease  Nivolumab at 3 mg/mg and ipilimumab at 1 mg/kg cycle 1 started on 09/21/2017.  He completed 4 cycles of therapy.     Current therapy: Active surveillance.  Interim History: [name] returns today for a repeat evaluation.  Since the last visit, he continues to have issues with weakness, fatigue and lethargy.  He was hospitalized in the end of January and was discharged on March 16, 2018.  He was subsequently seen in the symptom management clinic on 03/22/2018 for the evaluation for the same symptoms.  He received intravenous hydration although no significant improvement in his symptoms noted.  He was noted to have low sodium of 120.  Today, reports similar symptoms.  He is overall fatigue and cannot sleep.  Does not report any localizing symptoms.  Did not have any fevers or chills or sweats.  Denies any bone pain or pathological fractures.    Patient denied any neuropathy, confusion or dizziness.  Denies any night sweats, weight loss or changes in appetite.  Denied orthopnea, dyspnea on exertion or chest discomfort.  Denies shortness of breath, difficulty breathing hemoptysis or cough.  Denies any abdominal distention, nausea, early satiety or dyspepsia.  Denies any hematuria, frequency, dysuria or nocturia.  Denies any skin irritation, dryness or rash.  Denies any ecchymosis or petechiae.  Denies any lymphadenopathy or clotting.  Denies any heat or  cold intolerance.  Denies any anxiety or depression.  Remaining review of system is negative.      Medications: I have reviewed the patient's current medications.  Current Outpatient Medications  Medication Sig Dispense Refill  . acetaminophen (TYLENOL) 325 MG tablet Take 2 tablets (650 mg total) by mouth every 6 (six) hours as needed for mild pain or headache (over the counter). 30 tablet 3  . aspirin 81 MG chewable tablet Chew 81 mg by mouth daily.    Marland Kitchen lisinopril (PRINIVIL,ZESTRIL) 20 MG tablet     . Multiple Vitamin (MULTIVITAMIN WITH MINERALS) TABS tablet Take 1 tablet by mouth daily. 30 tablet 3  . pantoprazole (PROTONIX) 40 MG tablet Take 1 tablet (40 mg total) by mouth daily at 6 (six) AM. 30 tablet 3  . prochlorperazine (COMPAZINE) 10 MG tablet Take 1 tablet (10 mg total) by mouth every 6 (six) hours as needed for nausea or vomiting. 30 tablet 0  . temazepam (RESTORIL) 7.5 MG capsule Take 1 capsule (7.5 mg total) by mouth at bedtime as needed for sleep. 30 capsule 0   No current facility-administered medications for this visit.      Allergies:  Allergies  Allergen Reactions  . Benadryl [Diphenhydramine] Other (See Comments)    Severe restless legs    Past Medical History, Surgical history, Social history, and Family History were reviewed and updated.    Physical Exam:     Blood pressure 97/65, pulse 60, temperature 97.6 F (36.4 C), temperature source Oral, resp. rate 17, height 5\' 9"  (1.753 m), weight 189 lb 9.6 oz (86  kg), SpO2 97 %.      ECOG: 1    General appearance: Lethargic but responsive appeared without distress. Head: Atraumatic without abnormalities Oropharynx: Without any thrush or ulcers. Eyes: No scleral icterus. Lymph nodes: No lymphadenopathy noted in the cervical, supraclavicular, or axillary nodes Heart:regular rate and rhythm, without any murmurs or gallops.   Lung: Clear to auscultation without any rhonchi, wheezes or dullness to  percussion. Abdomin: Soft, nontender without any shifting dullness or ascites. Musculoskeletal: No clubbing or cyanosis. Neurological: No motor or sensory deficits. Skin: No rashes or lesions.              Lab Results: Lab Results  Component Value Date   WBC 6.3 03/24/2018   HGB 12.8 (L) 03/24/2018   HCT 35.1 (L) 03/24/2018   MCV 79.4 (L) 03/24/2018   PLT 246 03/24/2018     Chemistry      Component Value Date/Time   NA 120 (L) 03/22/2018 1327   NA 134 03/11/2018 0913   K 4.0 03/22/2018 1327   CL 86 (L) 03/22/2018 1327   CO2 26 03/22/2018 1327   BUN 22 03/22/2018 1327   BUN 17 03/11/2018 0913   CREATININE 1.84 (H) 03/22/2018 1327      Component Value Date/Time   CALCIUM 9.4 03/22/2018 1327   ALKPHOS 45 03/22/2018 1327   AST 58 (H) 03/22/2018 1327   ALT 16 03/22/2018 1327   BILITOT 0.8 03/22/2018 1327       IMPRESSION: 1. General improvement, with reduction in size of the left omental tumor implants and of the retrocaval lymph node compared to the prior exam. The 8 mm nodule in the left lower lobe is stable. The small metastatic lesion posteriorly in segment 6 of the liver is only faintly seen on today's exam, and mildly improved in size. No new metastatic lesions are identified. 2. New small pericardial effusion, significance uncertain. 3. Other imaging findings of potential clinical significance: Ascending aortic ectasia. Aortic Atherosclerosis (ICD10-I70.0). Coronary atherosclerosis. Chondrocalcinosis raising the possibility of CPPD arthropathy. Prior right nephrectomy. Sigmoid diverticulosis. Prostatomegaly. Lower lumbar impingement at L4-5 at L5-S1.     Impression and Plan:  73 year old man with:  1.  Stage IV renal cell carcinoma with liver and peritoneal disease.  CT scan obtained in January 2020 did not show any progression of disease and at this time given his other complaints I will continue to hold off any additional treatment.  He  was plan to start maintenance nivolumab but I fear that he might be experiencing delayed toxicity from immunotherapy and it would not be wise to do so.      2.  Immune mediated complications: It is unclear that the symptoms he experiencing may be related to delayed toxicity from immunotherapy.  I will start him on prednisone 60 mg with a quick taper in case he we are dealing with a immune mediated complication.  Instructions how to do that was discussed today with the patient.  3.  Hyponatremia: We will repeat his sodium today and proceed with normal saline replacement.  Appears to be more dehydration but SIADH could be also a possibility.  Diabetes insipidus considered less likely.  4.  Dehydration and failure to thrive: He will receive intravenous hydration with Zofran today.  5.  Prognosis and goals of care: His disease is incurable although aggressive therapy is warranted given his reasonable performance status.  6.  Follow-up: We will be in 2 to 3 weeks to follow his progress.  25  minutes was spent with the patient face-to-face today.  More than 50% of time was dedicated to discussing his disease status, treatment options and managing complications related therapy.   Zola Button, MD 2/6/20208:40 AM    Patient continues to be lethargic and his sodium came back significantly on 113 indicating severe metabolic derangement that requires urgent evaluation.  Differential diagnosis includes SIADH, versus dehydration among CNS process.  Urgent imaging of the brain would be required given these findings.  He will be referred to the emergency department for this to be done urgently.  Zola Button MD 03/24/18

## 2018-03-24 NOTE — ED Notes (Signed)
CRITICAL VALUE STICKER  CRITICAL VALUE: Serum Osmolality   RECEIVER (on-site recipient of call):  DATE & TIME NOTIFIED: 03/24/2018 1216  MESSENGER (representative from lab): Freda Munro  MD NOTIFIED: Cruzita Lederer MD  TIME OF NOTIFICATION: 03/24/18 1216 -paged via Epic  RESPONSE: Awaiting Response

## 2018-03-24 NOTE — Patient Instructions (Signed)
Dehydration, Adult  Dehydration is when there is not enough fluid or water in your body. This happens when you lose more fluids than you take in. Dehydration can range from mild to very bad. It should be treated right away to keep it from getting very bad. Symptoms of mild dehydration may include:  Thirst.  Dry lips.  Slightly dry mouth.  Dry, warm skin.  Dizziness. Symptoms of moderate dehydration may include:  Very dry mouth.  Muscle cramps.  Dark pee (urine). Pee may be the color of tea.  Your body making less pee.  Your eyes making fewer tears.  Heartbeat that is uneven or faster than normal (palpitations).  Headache.  Light-headedness, especially when you stand up from sitting.  Fainting (syncope). Symptoms of very bad dehydration may include:  Changes in skin, such as: ? Cold and clammy skin. ? Blotchy (mottled) or pale skin. ? Skin that does not quickly return to normal after being lightly pinched and let go (poor skin turgor).  Changes in body fluids, such as: ? Feeling very thirsty. ? Your eyes making fewer tears. ? Not sweating when body temperature is high, such as in hot weather. ? Your body making very little pee.  Changes in vital signs, such as: ? Weak pulse. ? Pulse that is more than 100 beats a minute when you are sitting still. ? Fast breathing. ? Low blood pressure.  Other changes, such as: ? Sunken eyes. ? Cold hands and feet. ? Confusion. ? Lack of energy (lethargy). ? Trouble waking up from sleep. ? Short-term weight loss. ? Unconsciousness. Follow these instructions at home:   If told by your doctor, drink an ORS: ? Make an ORS by using instructions on the package. ? Start by drinking small amounts, about  cup (120 mL) every 5-10 minutes. ? Slowly drink more until you have had the amount that your doctor said to have.  Drink enough clear fluid to keep your pee clear or pale yellow. If you were told to drink an ORS, finish the  ORS first, then start slowly drinking clear fluids. Drink fluids such as: ? Water. Do not drink only water by itself. Doing that can make the salt (sodium) level in your body get too low (hyponatremia). ? Ice chips. ? Fruit juice that you have added water to (diluted). ? Low-calorie sports drinks.  Avoid: ? Alcohol. ? Drinks that have a lot of sugar. These include high-calorie sports drinks, fruit juice that does not have water added, and soda. ? Caffeine. ? Foods that are greasy or have a lot of fat or sugar.  Take over-the-counter and prescription medicines only as told by your doctor.  Do not take salt tablets. Doing that can make the salt level in your body get too high (hypernatremia).  Eat foods that have minerals (electrolytes). Examples include bananas, oranges, potatoes, tomatoes, and spinach.  Keep all follow-up visits as told by your doctor. This is important. Contact a doctor if:  You have belly (abdominal) pain that: ? Gets worse. ? Stays in one area (localizes).  You have a rash.  You have a stiff neck.  You get angry or annoyed more easily than normal (irritability).  You are more sleepy than normal.  You have a harder time waking up than normal.  You feel: ? Weak. ? Dizzy. ? Very thirsty.  You have peed (urinated) only a small amount of very dark pee during 6-8 hours. Get help right away if:  You have   symptoms of very bad dehydration.  You cannot drink fluids without throwing up (vomiting).  Your symptoms get worse with treatment.  You have a fever.  You have a very bad headache.  You are throwing up or having watery poop (diarrhea) and it: ? Gets worse. ? Does not go away.  You have blood or something green (bile) in your throw-up.  You have blood in your poop (stool). This may cause poop to look black and tarry.  You have not peed in 6-8 hours.  You pass out (faint).  Your heart rate when you are sitting still is more than 100 beats a  minute.  You have trouble breathing. This information is not intended to replace advice given to you by your health care provider. Make sure you discuss any questions you have with your health care provider. Document Released: 11/29/2008 Document Revised: 08/23/2015 Document Reviewed: 03/29/2015 Elsevier Interactive Patient Education  2019 [employer]  

## 2018-03-24 NOTE — Progress Notes (Signed)
CRITICAL VALUE ALERT  Critical Value:  Na 116  Date & Time Notied: 03/24/18, 1900  Provider Notified: Dr. Cruzita Lederer   Orders Received/Actions taken: Noted.

## 2018-03-24 NOTE — Telephone Encounter (Signed)
Per 2/6 Shadad verbal request to disregard scheduling the patient he is currently in the hospital. He will remove the los

## 2018-03-25 DIAGNOSIS — N183 Chronic kidney disease, stage 3 (moderate): Secondary | ICD-10-CM

## 2018-03-25 DIAGNOSIS — E871 Hypo-osmolality and hyponatremia: Secondary | ICD-10-CM

## 2018-03-25 DIAGNOSIS — E2740 Unspecified adrenocortical insufficiency: Secondary | ICD-10-CM

## 2018-03-25 DIAGNOSIS — C641 Malignant neoplasm of right kidney, except renal pelvis: Secondary | ICD-10-CM

## 2018-03-25 LAB — BASIC METABOLIC PANEL
Anion gap: 12 (ref 5–15)
Anion gap: 12 (ref 5–15)
Anion gap: 9 (ref 5–15)
BUN: 20 mg/dL (ref 8–23)
BUN: 20 mg/dL (ref 8–23)
BUN: 21 mg/dL (ref 8–23)
CALCIUM: 8.9 mg/dL (ref 8.9–10.3)
CALCIUM: 8.8 mg/dL — AB (ref 8.9–10.3)
CO2: 19 mmol/L — ABNORMAL LOW (ref 22–32)
CO2: 19 mmol/L — AB (ref 22–32)
CO2: 23 mmol/L (ref 22–32)
Calcium: 8.8 mg/dL — ABNORMAL LOW (ref 8.9–10.3)
Chloride: 89 mmol/L — ABNORMAL LOW (ref 98–111)
Chloride: 92 mmol/L — ABNORMAL LOW (ref 98–111)
Chloride: 92 mmol/L — ABNORMAL LOW (ref 98–111)
Creatinine, Ser: 1.55 mg/dL — ABNORMAL HIGH (ref 0.61–1.24)
Creatinine, Ser: 1.59 mg/dL — ABNORMAL HIGH (ref 0.61–1.24)
Creatinine, Ser: 1.64 mg/dL — ABNORMAL HIGH (ref 0.61–1.24)
GFR calc Af Amer: 50 mL/min — ABNORMAL LOW (ref >60)
GFR calc Af Amer: 48 mL/min — ABNORMAL LOW (ref >60)
GFR calc Af Amer: 47 mL/min — ABNORMAL LOW (ref >60)
GFR calc non Af Amer: 43 mL/min — ABNORMAL LOW (ref >60)
GFR calc non Af Amer: 42 mL/min — ABNORMAL LOW (ref >60)
GFR calc non Af Amer: 40 mL/min — ABNORMAL LOW (ref >60)
Glucose, Bld: 168 mg/dL — ABNORMAL HIGH (ref 70–99)
Glucose, Bld: 147 mg/dL — ABNORMAL HIGH (ref 70–99)
Glucose, Bld: 167 mg/dL — ABNORMAL HIGH (ref 70–99)
Potassium: 5.2 mmol/L — ABNORMAL HIGH (ref 3.5–5.1)
Potassium: 5.1 mmol/L (ref 3.5–5.1)
Potassium: 4.9 mmol/L (ref 3.5–5.1)
Sodium: 120 mmol/L — ABNORMAL LOW (ref 135–145)
Sodium: 123 mmol/L — ABNORMAL LOW (ref 135–145)
Sodium: 124 mmol/L — ABNORMAL LOW (ref 135–145)

## 2018-03-25 LAB — UREA NITROGEN, URINE: Urea Nitrogen, Ur: 554 mg/dL

## 2018-03-25 LAB — ACTH STIMULATION, 3 TIME POINTS
Cortisol, 30 Min: 6.9 ug/dL
Cortisol, 60 Min: 8.6 ug/dL
Cortisol, Base: 2.2 ug/dL

## 2018-03-25 LAB — T3, FREE: T3, Free: 0.5 pg/mL — ABNORMAL LOW (ref 2.0–4.4)

## 2018-03-25 MED ORDER — ONDANSETRON HCL 4 MG/2ML IJ SOLN
4.0000 mg | Freq: Four times a day (QID) | INTRAMUSCULAR | Status: DC | PRN
  Administered 2018-03-25: 4 mg via INTRAVENOUS
  Filled 2018-03-25: qty 2

## 2018-03-25 MED ORDER — PROCHLORPERAZINE MALEATE 10 MG PO TABS: 10.0000 mg | ORAL_TABLET | Freq: Four times a day (QID) | ORAL | Status: DC | PRN

## 2018-03-25 MED ORDER — LORAZEPAM 1 MG PO TABS
1.0000 mg | ORAL_TABLET | Freq: Once | ORAL | Status: AC
  Administered 2018-03-25: 1 mg via ORAL
  Filled 2018-03-25: qty 1

## 2018-03-25 MED ORDER — ORAL CARE MOUTH RINSE
15.0000 mL | Freq: Two times a day (BID) | OROMUCOSAL | Status: DC
  Administered 2018-03-26 – 2018-03-27 (×2): 15 mL via OROMUCOSAL

## 2018-03-25 MED ORDER — LORAZEPAM 0.5 MG PO TABS
0.5000 mg | ORAL_TABLET | Freq: Once | ORAL | Status: AC
  Administered 2018-03-25: 0.5 mg via ORAL
  Filled 2018-03-25: qty 1

## 2018-03-25 NOTE — Evaluation (Signed)
Physical Therapy Evaluation Patient Details Name: [name] MRN: [id number] DOB: [birth year] Today's Date: 03/25/2018   History of Present Illness  Pt admitted wtih severe hyponatremia.  Pt is 73 y.o. male  with hx of metastatic clear cell renal cell CA (mets to lung, liver, and peritoneum) on nivolumab (last dose in Nov 2019) who presents with generalized fatigue and loss of appetite.  Clinical Impression  Pt admitted as above and presenting with functional mobility limitations 2* generalized weakness and balance deficits with ambulation.  Pt would benefit from follow up rehab at SNF level to maximize IND and safety prior to return home dependent on acute stay progress and level of assist that can be arranged at home.    Follow Up Recommendations Home health PT;SNF;Supervision/Assistance - 24 hour(dependent on acute stay progress and home assist available)    Equipment Recommendations  None recommended by PT    Recommendations for Other Services       Precautions / Restrictions Precautions Precautions: Fall Restrictions Weight Bearing Restrictions: No      Mobility  Bed Mobility Overal bed mobility: Needs Assistance Bed Mobility: Supine to Sit     Supine to sit: Min assist        Transfers Overall transfer level: Needs assistance Equipment used: Rolling walker (2 wheeled) Transfers: Sit to/from Stand Sit to Stand: Min assist         General transfer comment: Assist to rise, steady, control descent. VCs safety, technique.  Ambulation/Gait Ambulation/Gait assistance: Min assist;Mod assist Gait Distance (Feet): 230 Feet Assistive device: Rolling walker (2 wheeled) Gait Pattern/deviations: Step-through pattern;Decreased step length - right;Decreased step length - left;Shuffle;Trunk flexed;Staggering right;Staggering left     General Gait Details: Pt unstable with near balance loss to L and R; Cues for posture and position from RW.  Stairs             Wheelchair Mobility    Modified Rankin (Stroke Patients Only)       Balance Overall balance assessment: Needs assistance Sitting-balance support: No upper extremity supported;Feet supported Sitting balance-Leahy Scale: Good     Standing balance support: Bilateral upper extremity supported Standing balance-Leahy Scale: Poor                               Pertinent Vitals/Pain Pain Assessment: No/denies pain    Home Living Family/patient expects to be discharged to:: Private residence Living Arrangements: Children Available Help at Discharge: Family;Available PRN/intermittently Type of Home: House Home Access: Stairs to enter Entrance Stairs-Rails: Right Entrance Stairs-Number of Steps: 4 Home Layout: One level Home Equipment: Walker - 2 wheels;Cane - single point      Prior Function Level of Independence: Independent               Hand Dominance        Extremity/Trunk Assessment   Upper Extremity Assessment Upper Extremity Assessment: Generalized weakness    Lower Extremity Assessment Lower Extremity Assessment: Generalized weakness       Communication   Communication: No difficulties  Cognition Arousal/Alertness: Awake/alert Behavior During Therapy: WFL for tasks assessed/performed Overall Cognitive Status: Within Functional Limits for tasks assessed                                 General Comments: Pt sleeping but rousable      General Comments      Exercises  Assessment/Plan    PT Assessment Patient needs continued PT services  PT Problem List Decreased activity tolerance;Decreased balance;Decreased mobility       PT Treatment Interventions DME instruction;Gait training;Functional mobility training;Therapeutic activities;Balance training;Therapeutic exercise;Stair training;Patient/family education    PT Goals (Current goals can be found in the Care Plan section)  Acute Rehab PT Goals Patient Stated  Goal: home PT Goal Formulation: With patient/family Time For Goal Achievement: 03/28/18 Potential to Achieve Goals: Good    Frequency Min 3X/week   Barriers to discharge Decreased caregiver support Family can assist intermittently only    Co-evaluation               AM-PAC PT "6 Clicks" Mobility  Outcome Measure Help needed turning from your back to your side while in a flat bed without using bedrails?: A Little Help needed moving from lying on your back to sitting on the side of a flat bed without using bedrails?: A Little Help needed moving to and from a bed to a chair (including a wheelchair)?: A Little Help needed standing up from a chair using your arms (e.g., wheelchair or bedside chair)?: A Little Help needed to walk in hospital room?: A Lot Help needed climbing 3-5 steps with a railing? : A Lot 6 Click Score: 16    End of Session Equipment Utilized During Treatment: Gait belt;Oxygen Activity Tolerance: Patient tolerated treatment well Patient left: in chair;with call bell/phone within reach;with chair alarm set Nurse Communication: Mobility status PT Visit Diagnosis: Difficulty in walking, not elsewhere classified (R26.2);Unsteadiness on feet (R26.81)    Time: 9774-1423 PT Time Calculation (min) (ACUTE ONLY): 18 min   Charges:   PT Evaluation $PT Eval Low Complexity: 1 Low          [place] Pager [phone] Office [phone]   Jaquel Glassburn 03/25/2018, 12:25 PM

## 2018-03-25 NOTE — Progress Notes (Signed)
Patient's O2 dropped to 84% on room air while sleeping. Paged K. Schorr. New order for 2L O2. Placed patient on 2L O2. O2 sats now 97% on 2L O2. Will continue to monitor.

## 2018-03-25 NOTE — Progress Notes (Addendum)
Patient requesting medication for sleep. He is very restless. Paged K. Schorr. New order 1mg  PO ativan ordered and given. Will continue to monitor.

## 2018-03-25 NOTE — Progress Notes (Signed)
IP PROGRESS NOTE  Subjective:   Mr.Mehlberg is known to me with history of renal cell carcinoma status post therapy with nivolumab and ipilimumab with excellent response.  He presented with lethargy and found to have severe hyponatremia and hospitalized urgently.  He was started on dexamethasone and IV hydration with normal saline.  Clinically he still slightly confused but more interactive than yesterday.  He denies any pain or neurological deficits.  Denies any fevers or chills.  Denies any abdominal pain.  He does reports insomnia.  Objective:  Vital signs in last 24 hours: Temp:  [97.5 F (36.4 C)-99.9 F (37.7 C)] 98.3 F (36.8 C) (02/07 0818) Pulse Rate:  [64-98] 89 (02/07 0818) Resp:  [10-20] 12 (02/07 0818) BP: (111-151)/(69-99) 139/81 (02/07 0818) SpO2:  [90 %-98 %] 98 % (02/07 0818) Weight:  [180 lb 12.4 oz (82 kg)] 180 lb 12.4 oz (82 kg) (02/06 1739) Weight change:  Last BM Date: 03/23/18  Intake/Output from previous day: 02/06 0701 - 02/07 0700 In: 1630.5 [P.O.:120; I.V.:1510.5] Out: 1875 [Urine:1875]   General: Alert, awake without distress. Head: Normocephalic atraumatic. Mouth: mucous membranes moist, pharynx normal without lesions Eyes: No scleral icterus.  Pupils are equal and round reactive to light. Resp: clear to auscultation bilaterally without rhonchi or wheezes or dullness to percussion. Cardio: regular rate and rhythm, S1, S2 normal, no murmur, click, rub or gallop GI: soft, non-tender; bowel sounds normal; no masses,  no organomegaly Musculoskeletal: No joint deformity or effusion. Neurological: No motor, sensory deficits.  Intact deep tendon reflexes. Skin: No rashes or lesions.  Portacath/PICC-without erythema  Lab Results: Recent Labs    03/24/18 0818 03/24/18 1039  WBC 6.3 6.5  HGB 12.8* 12.5*  HCT 35.1* 35.3*  PLT 246 252    BMET Recent Labs    03/25/18 0640 03/25/18 1150  NA 120* 123*  K 5.2* 5.1  CL 89* 92*  CO2 19* 19*  GLUCOSE  168* 147*  BUN 20 20  CREATININE 1.55* 1.59*  CALCIUM 8.9 8.8*    Studies/Results: No results found.  Medications: I have reviewed the patient's current medications.  Assessment/Plan:  73 year old with the following:  1.  Hyponatremia: Etiology could be related to dehydration, SIADH or panhypopituitarism related to his immunotherapy.  He has signs symptoms of adrenal insufficiency as well as thyroid dysfunction could be a manifestation of his immune therapy.  His sodium has improved from 113 to120 with a mild improvement in his clinical status.  I agree with the current management of the primary team.  And I would recommend imaging of the brain when it is possible.  Endocrinology evaluation may be helpful especially if we are dealing with panhypopituitary is him is leading to his metabolic derangements.  2.  Renal cell carcinoma: His disease has responded well to immunotherapy and no treatment is needed for the time being.  Any future treatment will be on hold till his acute illness has resolved.  We will continue to follow while he is hospitalized regarding these issues.   15  minutes was spent with the patient face-to-face today.  More than 50% of time was dedicated to reviewing his laboratory data, discussing the differential diagnosis for possible etiology for his metabolic derangement.    LOS: 1 day   Zola Button 03/25/2018, 12:42 PM

## 2018-03-25 NOTE — Progress Notes (Signed)
[facility] PROGRESS NOTE  [name] EHO:[id number] DOB: 06-Dec-1945 DOA: 03/24/2018  PCP: [name], MD  Brief History/Interval Summary: 73 y.o. male with medical history significant of stage IV renal cell carcinoma, initially localized and diagnosed in 2016, status post radical nephrectomy 2016, now with stage IV with omental mets as well as retrocaval lymph node involvement, and also has potentially metastatic lesion in the liver. On most recent CT scan in January 2020 did not show any progression of the disease.  He also received nivolumab as well as Iiplimumab, completed 4 cycles of therapy.  Also has a history of coronary artery disease, hyperlipidemia, hypertension.    He was sent over to the emergency department by his oncologist for a sodium level of 116.  Patient was hospitalized for further management.    Reason for Visit: Hyponatremia  Consultants: None  Procedures: None  Antibiotics: None  Subjective/Interval History: Patient somnolent this morning but easily arousable.  Denies any headaches.  No nausea vomiting.  Denies any dizziness or lightheadedness.  ROS: Denies any chest pain or shortness of breath.  Objective:  Vital Signs  Vitals:   03/25/18 0300 03/25/18 0400 03/25/18 0600 03/25/18 0818  BP: 111/82  126/88 139/81  Pulse: 86  89 89  Resp: 14  14 12   Temp:  98.2 F (36.8 C)    TempSrc:  Axillary    SpO2: 98%  97% 98%  Weight:      Height:        Intake/Output Summary (Last 24 hours) at 03/25/2018 0948 Last data filed at 03/25/2018 0800 Gross per 24 hour  Intake 1705.49 ml  Output 1875 ml  Net -169.51 ml   Filed Weights   03/24/18 0952 03/24/18 1739  Weight: 85.7 kg 82 kg    General appearance: Somnolent but arousable.  cooperative, appears stated age, distracted and no distress Head: Normocephalic, without obvious abnormality, atraumatic Resp: Normal effort.  Coarse breath sounds bilaterally.  No wheezing rales or  rhonchi. Cardio: regular rate and rhythm, S1, S2 normal, no murmur, click, rub or gallop GI: soft, non-tender; bowel sounds normal; no masses,  no organomegaly Extremities: extremities normal, atraumatic, no cyanosis or edema Pulses: 2+ and symmetric Neurologic: No focal neurological deficits.  Somnolent but easily arousable.  Moving all his extremities.  Lab Results:  Data Reviewed: I have personally reviewed following labs and imaging studies  CBC: Recent Labs  Lab 03/22/18 1327 03/24/18 0818 03/24/18 1039  WBC 9.1 6.3 6.5  NEUTROABS 5.4 3.0 3.0  HGB 12.1* 12.8* 12.5*  HCT 35.1* 35.1* 35.3*  MCV 82.2 79.4* 82.9  PLT 254 246 370    Basic Metabolic Panel: Recent Labs  Lab 03/24/18 0818 03/24/18 1039 03/24/18 1741 03/24/18 2309 03/25/18 0640  NA 116* 115* 116* 117* 120*  K 4.2 4.0 4.2 4.7 5.2*  CL 84* 81* 83* 84* 89*  CO2 22 24 23  21* 19*  GLUCOSE 83 79 96 267* 168*  BUN 16 19 19 21 20   CREATININE 1.60* 1.59* 1.59* 1.53* 1.55*  CALCIUM 9.2 8.8* 8.9 8.7* 8.9    GFR: Estimated Creatinine Clearance: 41.2 mL/min (A) (by C-G formula based on SCr of 1.55 mg/dL (H)).  Liver Function Tests: Recent Labs  Lab 03/22/18 1327 03/24/18 0818 03/24/18 1039  AST 58* 68* 69*  ALT 16 21 22   ALKPHOS 45 40 35*  BILITOT 0.8 0.7 0.8  PROT 7.6 7.5 7.6  ALBUMIN 4.2 4.3 4.6    Thyroid Function Tests: Recent Labs  03/24/18 1039 03/24/18 1328  TSH 86.397*  --   FREET4  --  <0.25*  T3FREE  --  0.5*    Recent Results (from the past 240 hour(s))  MRSA PCR Screening     Status: None   Collection Time: 03/24/18  7:23 PM  Result Value Ref Range Status   MRSA by PCR NEGATIVE NEGATIVE Final    Comment:        The GeneXpert MRSA Assay (FDA approved for NASAL specimens only), is one component of a comprehensive MRSA colonization surveillance program. It is not intended to diagnose MRSA infection nor to guide or monitor treatment for MRSA infections. Performed at [facility], [place] [address], [place], [place] [address]       Radiology Studies: No results found.   Medications:  Scheduled: . aspirin  81 mg Oral Daily  . dexamethasone  4 mg Intravenous Q12H  . enoxaparin (LOVENOX) injection  40 mg Subcutaneous Q24H  . levothyroxine  50 mcg Intravenous Daily  . mouth rinse  15 mL Mouth Rinse BID   Continuous: . sodium chloride 75 mL/hr at 03/25/18 4818   HUD:[id number], pantoprazole, prochlorperazine    Assessment/Plan:   Hyponatremia This is in the setting of profound hypothyroidism.  Multifactorial including SIADH, poor oral intake.  Serum osmolality 245.  Urine osmolality 460.  Somewhat consistent with SIADH.  Urine sodium 69.  Fractional excretion of sodium is 0.53%.  Have a low plasma cortisol level of 2.4.  ACTH stimulation test showed suboptimal response from 2.2 to 8.6.  Due to concerns for hypovolemia patient was placed on IV fluids.  Sodium has improved to 120.  Potassium also noted to be mildly elevated.  We will wait for the next set of labs.  Continue IV fluids for now.  Continue fluid restriction as well.  Adrenal insufficiency Patient had a suboptimal response to the ACTH stimulation test.  Patient was placed on Decadron last night which will be continued for now.  Severe hypothyroidism TSH noted to be significantly elevated at 86.  Free T4 was less than 0.25.  Patient started on IV Synthroid which will be continued.  Chronic kidney disease stage III Baseline creatinine 1.8.  Creatinine is stable.  Monitor urine output.  Holding lisinopril.  Avoid nephrotoxic agents.  Metastatic renal cell cancer His oncologist is aware of this hospitalization.  MRI brain was considered due to reports of worsening balance at home.  However patient has experienced trouble with MRI previously so he refused to do overnight.  Have any focal neurological deficits currently.  May consider CT head although the yield may be  low.  History of essential hypertension Holding lisinopril.  Monitor blood pressure  Small pericardial effusion This was noted on previous hospitalization.  Could be malignant.  Could be due to hypothyroidism.  Continue to monitor.  No hemodynamic compromise noted.  Normocytic anemia Likely due to chronic disease.  No evidence for overt bleeding.  Suspected immune mediated complications Per patient's oncologist notes there was a suspicion that his symptoms could be due to delayed toxicity from immunotherapy.  Steroids were recommended.  Patient is on dexamethasone.  History of coronary artery disease Stable.  Continue aspirin.  DVT Prophylaxis: Lovenox    Code Status: Full code Family Communication: Discussed with the patient.  No family at bedside Disposition Plan: Management as outlined above.  Will remain in stepdown unit for now.    LOS: 1 day   [facility] Pager  on [email or web address]  03/25/2018, 9:48 AM

## 2018-03-26 DIAGNOSIS — E271 Primary adrenocortical insufficiency: Secondary | ICD-10-CM

## 2018-03-26 LAB — BASIC METABOLIC PANEL
Anion gap: 10 (ref 5–15)
BUN: 21 mg/dL (ref 8–23)
CO2: 22 mmol/L (ref 22–32)
Calcium: 8.9 mg/dL (ref 8.9–10.3)
Chloride: 97 mmol/L — ABNORMAL LOW (ref 98–111)
Creatinine, Ser: 1.7 mg/dL — ABNORMAL HIGH (ref 0.61–1.24)
GFR calc Af Amer: 45 mL/min — ABNORMAL LOW (ref >60)
GFR calc non Af Amer: 39 mL/min — ABNORMAL LOW (ref >60)
Glucose, Bld: 143 mg/dL — ABNORMAL HIGH (ref 70–99)
POTASSIUM: 4.6 mmol/L (ref 3.5–5.1)
Sodium: 129 mmol/L — ABNORMAL LOW (ref 135–145)

## 2018-03-26 LAB — CBC
HCT: 34.5 % — ABNORMAL LOW (ref 39.0–52.0)
Hemoglobin: 11.7 g/dL — ABNORMAL LOW (ref 13.0–17.0)
MCH: 28.4 pg (ref 26.0–34.0)
MCHC: 33.9 g/dL (ref 30.0–36.0)
MCV: 83.7 fL (ref 80.0–100.0)
Platelets: 309 10*3/uL (ref 150–400)
RBC: 4.12 MIL/uL — AB (ref 4.22–5.81)
RDW: 14.1 % (ref 11.5–15.5)
WBC: 16 10*3/uL — ABNORMAL HIGH (ref 4.0–10.5)
nRBC: 0 % (ref 0.0–0.2)

## 2018-03-26 MED ORDER — HYDROCORTISONE NA SUCCINATE PF 100 MG IJ SOLR
50.0000 mg | Freq: Three times a day (TID) | INTRAMUSCULAR | Status: DC
  Administered 2018-03-26 – 2018-03-27 (×3): 50 mg via INTRAVENOUS
  Filled 2018-03-26: qty 2
  Filled 2018-03-26 (×3): qty 1

## 2018-03-26 MED ORDER — LORAZEPAM 0.5 MG PO TABS
0.5000 mg | ORAL_TABLET | Freq: Once | ORAL | Status: AC
  Administered 2018-03-26: 0.5 mg via ORAL
  Filled 2018-03-26: qty 1

## 2018-03-26 NOTE — Progress Notes (Signed)
Dr. [place] Pink aware via phone pt c/o restless leg. MD ordered one time dose of Ativan 0.5mg  po.

## 2018-03-26 NOTE — Progress Notes (Signed)
[facility] PROGRESS NOTE  Jaray Boliver [id number]:[id number] DOB: 1946/01/11 DOA: 03/24/2018  PCP: [name], MD  Brief History/Interval Summary: 73 y.o. male with medical history significant of stage IV renal cell carcinoma, initially localized and diagnosed in 2016, status post radical nephrectomy 2016, now with stage IV with omental mets as well as retrocaval lymph node involvement, and also has potentially metastatic lesion in the liver. On most recent CT scan in January 2020 did not show any progression of the disease.  He also received nivolumab as well as Iiplimumab, completed 4 cycles of therapy.  Also has a history of coronary artery disease, hyperlipidemia, hypertension.    He was sent over to the emergency department by his oncologist for a sodium level of 116.  Patient was hospitalized for further management.    Reason for Visit: Hyponatremia  Consultants: None  Procedures: None  Antibiotics: None  Subjective/Interval History: Patient awake and alert this morning.  Denies any complaints.  He states that he cannot undergo MRI as the clips in his chest bother him during MRI which he is attempted previously.  Refuses to undergo the study at this time.    ROS: Denies any shortness of breath.  Objective:  Vital Signs  Vitals:   03/26/18 0400 03/26/18 0510 03/26/18 0520 03/26/18 0600  BP:  (!) 135/95  123/82  Pulse: 75 96 74 82  Resp: 10 (!) 24 13 13   Temp: (!) 97.5 F (36.4 C)     TempSrc: Axillary     SpO2: 98% 96% 97% 98%  Weight:      Height:        Intake/Output Summary (Last 24 hours) at 03/26/2018 0816 Last data filed at 03/26/2018 0500 Gross per 24 hour  Intake 839.86 ml  Output 2525 ml  Net -1685.14 ml   Filed Weights   03/24/18 0952 03/24/18 1739  Weight: 85.7 kg 82 kg   General appearance: Awake alert.  In no distress Resp: Clear to auscultation bilaterally.  Normal effort Cardio: S1-S2 is normal regular.  No S3-S4.  No rubs murmurs or  bruit GI: Abdomen is soft.  Nontender nondistended.  Bowel sounds are present normal.  No masses organomegaly Extremities: No edema.  Full range of motion of lower extremities. Neurologic: Alert and oriented x3.  No focal neurological deficits.   Lab Results:  Data Reviewed: I have personally reviewed following labs and imaging studies  CBC: Recent Labs  Lab 03/22/18 1327 03/24/18 0818 03/24/18 1039 03/26/18 0314  WBC 9.1 6.3 6.5 16.0*  NEUTROABS 5.4 3.0 3.0  --   HGB 12.1* 12.8* 12.5* 11.7*  HCT 35.1* 35.1* 35.3* 34.5*  MCV 82.2 79.4* 82.9 83.7  PLT 254 246 252 027    Basic Metabolic Panel: Recent Labs  Lab 03/24/18 2309 03/25/18 0640 03/25/18 1150 03/25/18 1930 03/26/18 0314  NA 117* 120* 123* 124* 129*  K 4.7 5.2* 5.1 4.9 4.6  CL 84* 89* 92* 92* 97*  CO2 21* 19* 19* 23 22  GLUCOSE 267* 168* 147* 167* 143*  BUN 21 20 20 21 21   CREATININE 1.53* 1.55* 1.59* 1.64* 1.70*  CALCIUM 8.7* 8.9 8.8* 8.8* 8.9    GFR: Estimated Creatinine Clearance: 37.5 mL/min (A) (by C-G formula based on SCr of 1.7 mg/dL (H)).  Liver Function Tests: Recent Labs  Lab 03/22/18 1327 03/24/18 0818 03/24/18 1039  AST 58* 68* 69*  ALT 16 21 22   ALKPHOS 45 40 35*  BILITOT 0.8 0.7 0.8  PROT 7.6  7.5 7.6  ALBUMIN 4.2 4.3 4.6    Thyroid Function Tests: Recent Labs    03/24/18 1039 03/24/18 1328  TSH 86.397*  --   FREET4  --  <0.25*  T3FREE  --  0.5*    Recent Results (from the past 240 hour(s))  MRSA PCR Screening     Status: None   Collection Time: 03/24/18  7:23 PM  Result Value Ref Range Status   MRSA by PCR NEGATIVE NEGATIVE Final    Comment:        The GeneXpert MRSA Assay (FDA approved for NASAL specimens only), is one component of a comprehensive MRSA colonization surveillance program. It is not intended to diagnose MRSA infection nor to guide or monitor treatment for MRSA infections. Performed at [facility], [place] [address],  [place], [place] [address]       Radiology Studies: No results found.   Medications:  Scheduled: . aspirin  81 mg Oral Daily  . dexamethasone  4 mg Intravenous Q12H  . enoxaparin (LOVENOX) injection  40 mg Subcutaneous Q24H  . levothyroxine  50 mcg Intravenous Daily  . mouth rinse  15 mL Mouth Rinse BID   Continuous: . sodium chloride 75 mL/hr at 03/26/18 0304   UXL:[id number], ondansetron (ZOFRAN) IV, pantoprazole, prochlorperazine    Assessment/Plan:   Hyponatremia This is in the setting of profound hypothyroidism.  Multifactorial including SIADH, poor oral intake.  Serum osmolality 245.  Urine osmolality 460.  Somewhat consistent with SIADH.  Urine sodium 69.  Fractional excretion of sodium is 0.53%.  Have a low plasma cortisol level of 2.4.  ACTH stimulation test showed suboptimal response from 2.2 to 8.6.  Due to concerns for hypovolemia patient was placed on IV fluids.  Sodium has improved to 129 this morning.  Potassium level is normal.  Continue IV fluids.    Adrenal insufficiency Patient had a suboptimal response to the ACTH stimulation test.  Patient was placed on dexamethasone.  Since he has clinically improved could consider transitioning him to oral steroids.  We will transition him to hydrocortisone parenteral for now and then change him to oral from tomorrow.  We will also need to consider mineralocorticoid replacement.    Nocturnal hypoxia This was noted overnight.  He was placed on oxygen.  Continue to monitor.  Will likely need a sleep study in the outpatient setting.  Severe hypothyroidism TSH noted to be significantly elevated at 86.  Free T4 was less than 0.25.  Patient started on IV Synthroid.  Patient's mentation has improved.  Consider transitioning to oral Synthroid tomorrow.   Chronic kidney disease stage III Baseline creatinine 1.8.  Creatinine has been stable.  Monitor urine output.  Avoid nephrotoxic agents.  Holding lisinopril.    Metastatic renal  cell cancer His oncologist is aware of this hospitalization.  MRI brain was considered due to reports of worsening balance at home.  However patient has experienced trouble with MRI previously so he refused to do at the time of admission.  He continues to refuse MRI brain.  He does not have any neurological deficits at this time.  Will defer neuroimaging to his outpatient providers.  History of essential hypertension Holding lisinopril.  Monitor blood pressure  Small pericardial effusion This was noted on previous hospitalization.  Could be malignant.  Could be due to hypothyroidism.  Continue to monitor.  No hemodynamic compromise noted.  Normocytic anemia Likely due to chronic disease.  No evidence for overt bleeding.  Suspected immune  mediated complications Per patient's oncologist notes there was a suspicion that his symptoms could be due to delayed toxicity from immunotherapy.  Currently on steroids.  History of coronary artery disease Stable.  Continue aspirin.  DVT Prophylaxis: Lovenox    Code Status: Full code Family Communication: Discussed with the patient.  No family at bedside Disposition Plan: Management as outlined above.  Okay for transition to medical floor today.  Mobilize.    LOS: 2 days   [facility] Pager on [email or web address]  03/26/2018, 8:16 AM

## 2018-03-26 NOTE — Progress Notes (Addendum)
Patient's O2 sats dropped to 82% while sleeping. Placed 2L O2 on patient. O2 sats now 99%. Will continue to monitor.

## 2018-03-27 LAB — BASIC METABOLIC PANEL
ANION GAP: 9 (ref 5–15)
BUN: 21 mg/dL (ref 8–23)
CO2: 24 mmol/L (ref 22–32)
Calcium: 8.7 mg/dL — ABNORMAL LOW (ref 8.9–10.3)
Chloride: 102 mmol/L (ref 98–111)
Creatinine, Ser: 1.66 mg/dL — ABNORMAL HIGH (ref 0.61–1.24)
GFR, EST AFRICAN AMERICAN: 46 mL/min — AB (ref >60)
GFR, EST NON AFRICAN AMERICAN: 40 mL/min — AB (ref >60)
Glucose, Bld: 127 mg/dL — ABNORMAL HIGH (ref 70–99)
Potassium: 4.3 mmol/L (ref 3.5–5.1)
Sodium: 135 mmol/L (ref 135–145)

## 2018-03-27 LAB — CBC
HCT: 33.6 % — ABNORMAL LOW (ref 39.0–52.0)
Hemoglobin: 10.9 g/dL — ABNORMAL LOW (ref 13.0–17.0)
MCH: 28.7 pg (ref 26.0–34.0)
MCHC: 32.4 g/dL (ref 30.0–36.0)
MCV: 88.4 fL (ref 80.0–100.0)
Platelets: 307 10*3/uL (ref 150–400)
RBC: 3.8 MIL/uL — ABNORMAL LOW (ref 4.22–5.81)
RDW: 14.6 % (ref 11.5–15.5)
WBC: 14.9 10*3/uL — ABNORMAL HIGH (ref 4.0–10.5)
nRBC: 0 % (ref 0.0–0.2)

## 2018-03-27 MED ORDER — DOCUSATE SODIUM 100 MG PO CAPS
100.0000 mg | ORAL_CAPSULE | Freq: Every day | ORAL | Status: DC | PRN
  Administered 2018-03-27: 100 mg via ORAL
  Filled 2018-03-27: qty 1

## 2018-03-27 MED ORDER — ROPINIROLE HCL 0.25 MG PO TABS
0.2500 mg | ORAL_TABLET | Freq: Once | ORAL | Status: AC
  Administered 2018-03-27: 0.25 mg via ORAL
  Filled 2018-03-27: qty 1

## 2018-03-27 MED ORDER — ROPINIROLE HCL 1 MG PO TABS
0.5000 mg | ORAL_TABLET | Freq: Once | ORAL | Status: AC
  Administered 2018-03-27: 0.5 mg via ORAL
  Filled 2018-03-27: qty 1

## 2018-03-27 MED ORDER — HYDROCORTISONE 10 MG PO TABS
10.0000 mg | ORAL_TABLET | Freq: Every evening | ORAL | Status: DC
  Administered 2018-03-27: 10 mg via ORAL
  Filled 2018-03-27 (×2): qty 1

## 2018-03-27 MED ORDER — HYDROCORTISONE 10 MG PO TABS
10.0000 mg | ORAL_TABLET | Freq: Every evening | ORAL | Status: DC
  Filled 2018-03-27: qty 1

## 2018-03-27 MED ORDER — LEVOTHYROXINE SODIUM 50 MCG PO TABS
50.0000 ug | ORAL_TABLET | Freq: Every day | ORAL | Status: DC
  Administered 2018-03-27 – 2018-03-28 (×2): 50 ug via ORAL
  Filled 2018-03-27 (×2): qty 1

## 2018-03-27 MED ORDER — HYDROCORTISONE 20 MG PO TABS
20.0000 mg | ORAL_TABLET | Freq: Every day | ORAL | Status: DC
  Administered 2018-03-27 – 2018-03-28 (×2): 20 mg via ORAL
  Filled 2018-03-27 (×2): qty 1

## 2018-03-27 NOTE — Plan of Care (Signed)
Plan of care reviewed and discussed with the patient. 

## 2018-03-27 NOTE — Progress Notes (Signed)
[facility] PROGRESS NOTE  [name] [id number] DOB: 01-Feb-1946 DOA: 03/24/2018  PCP: [name], MD  Brief History/Interval Summary: 73 y.o. male with medical history significant of stage IV renal cell carcinoma, initially localized and diagnosed in 2016, status post radical nephrectomy 2016, now with stage IV with omental mets as well as retrocaval lymph node involvement, and also has potentially metastatic lesion in the liver. On most recent CT scan in January 2020 did not show any progression of the disease.  He also received nivolumab as well as Iiplimumab, completed 4 cycles of therapy.  Also has a history of coronary artery disease, hyperlipidemia, hypertension.    He was sent over to the emergency department by his oncologist for a sodium level of 116.  Patient was hospitalized for further management.    Reason for Visit: Hyponatremia  Consultants: None  Procedures: None  Antibiotics: None  Subjective/Interval History: Patient denies any complaints.  Tolerating his diet.     ROS: Denies any shortness of breath  Objective:  Vital Signs  Vitals:   03/26/18 1654 03/26/18 1955 03/26/18 2141 03/27/18 0620  BP: (!) 141/80  (!) 126/97 139/88  Pulse: 79 80 78 68  Resp: 13 16 18 18   Temp: 97.7 F (36.5 C)  98.4 F (36.9 C) 97.9 F (36.6 C)  TempSrc: Axillary  Oral Oral  SpO2: 97%  95% 100%  Weight:      Height:        Intake/Output Summary (Last 24 hours) at 03/27/2018 1253 Last data filed at 03/27/2018 0932 Gross per 24 hour  Intake 4163.75 ml  Output 2500 ml  Net 1663.75 ml   Filed Weights   03/24/18 0952 03/24/18 1739  Weight: 85.7 kg 82 kg   General appearance: Awake alert.  In no distress Resp: Clear to auscultation bilaterally.  Normal effort Cardio: S1-S2 is normal regular.  No S3-S4.  No rubs murmurs or bruit GI: Abdomen is soft.  Nontender nondistended.  Bowel sounds are present normal.  No masses organomegaly Extremities: No edema.   Full range of motion of lower extremities. Neurologic: Alert and oriented x3.  No focal neurological deficits.    Lab Results:  Data Reviewed: I have personally reviewed following labs and imaging studies  CBC: Recent Labs  Lab 03/22/18 1327 03/24/18 0818 03/24/18 1039 03/26/18 0314 03/27/18 0454  WBC 9.1 6.3 6.5 16.0* 14.9*  NEUTROABS 5.4 3.0 3.0  --   --   HGB 12.1* 12.8* 12.5* 11.7* 10.9*  HCT 35.1* 35.1* 35.3* 34.5* 33.6*  MCV 82.2 79.4* 82.9 83.7 88.4  PLT 254 246 252 309 485    Basic Metabolic Panel: Recent Labs  Lab 03/25/18 0640 03/25/18 1150 03/25/18 1930 03/26/18 0314 03/27/18 0454  NA 120* 123* 124* 129* 135  K 5.2* 5.1 4.9 4.6 4.3  CL 89* 92* 92* 97* 102  CO2 19* 19* 23 22 24   GLUCOSE 168* 147* 167* 143* 127*  BUN 20 20 21 21 21   CREATININE 1.55* 1.59* 1.64* 1.70* 1.66*  CALCIUM 8.9 8.8* 8.8* 8.9 8.7*    GFR: Estimated Creatinine Clearance: 38.4 mL/min (A) (by C-G formula based on SCr of 1.66 mg/dL (H)).  Liver Function Tests: Recent Labs  Lab 03/22/18 1327 03/24/18 0818 03/24/18 1039  AST 58* 68* 69*  ALT 16 21 22   ALKPHOS 45 40 35*  BILITOT 0.8 0.7 0.8  PROT 7.6 7.5 7.6  ALBUMIN 4.2 4.3 4.6    Thyroid Function Tests: Recent Labs  03/24/18 1328  FREET4 <0.25*  T3FREE 0.5*    Recent Results (from the past 240 hour(s))  MRSA PCR Screening     Status: None   Collection Time: 03/24/18  7:23 PM  Result Value Ref Range Status   MRSA by PCR NEGATIVE NEGATIVE Final    Comment:        The GeneXpert MRSA Assay (FDA approved for NASAL specimens only), is one component of a comprehensive MRSA colonization surveillance program. It is not intended to diagnose MRSA infection nor to guide or monitor treatment for MRSA infections. Performed at [facility], [place] [address] [place], [place] [address]       Radiology Studies: No results found.   Medications:  Scheduled: . aspirin  81 mg Oral Daily  .  enoxaparin (LOVENOX) injection  40 mg Subcutaneous Q24H  . hydrocortisone  10 mg Oral QPM  . hydrocortisone  20 mg Oral Daily  . levothyroxine  50 mcg Oral Q0600  . mouth rinse  15 mL Mouth Rinse BID   Continuous: . sodium chloride 10 mL/hr at 03/27/18 1005   PPJ:[id number], ondansetron (ZOFRAN) IV, pantoprazole, prochlorperazine    Assessment/Plan:   Hyponatremia and hyperkalemia Etiology multifactorial including profound hypothyroidism, adrenal insufficiency, SIADH and poor oral intake.  Serum osmolality 245.  Urine osmolality 460.  Urine sodium 69.  Fractional excretion of sodium is 0.53%.  Have a low plasma cortisol level of 2.4.  ACTH stimulation test showed suboptimal response from 2.2 to 8.6.  Due to concerns for hypovolemia patient was placed on IV fluids.  Sodium has improved and is normal this morning.  Cut back on IV fluids.  Potassium is normal as well.     Adrenal insufficiency Patient had a suboptimal response to the ACTH stimulation test.  Patient was placed on dexamethasone.  Patient was transitioned to hydrocortisone.  Will be changed over to oral hydrocortisone today.  Will defer the matter of mineralocorticoid replacement to the outpatient setting.  He will need to be seen by endocrinology.     Nocturnal hypoxia This was noted overnight.  He was placed on oxygen.  Continue to monitor.  Will likely need a sleep study in the outpatient setting.  Severe hypothyroidism TSH noted to be significantly elevated at 86.  Free T4 was less than 0.25.  Patient started on IV Synthroid.  Patient's mentation has improved.  Changed to oral Synthroid today.  Chronic kidney disease stage III Baseline creatinine 1.8.  Creatinine is stable.  Monitor urine output.  Holding lisinopril.  Avoid nephrotoxic agents.    Metastatic renal cell cancer MRI brain was considered due to reports of worsening balance at home.  However patient has experienced trouble with MRI previously so he refused  to do at the time of admission.  He continues to refuse MRI brain.  He does not have any neurological deficits at this time.  Will defer neuroimaging to his outpatient providers.  Mobilize with physical therapy.  History of essential hypertension Holding lisinopril.  Monitor blood pressure.  Blood pressure is reasonably well controlled.  Small pericardial effusion This was noted on previous hospitalization.  Could be malignant.  Could be due to hypothyroidism.  Continue to monitor.  No hemodynamic compromise noted.  Normocytic anemia Likely due to chronic disease.  No evidence for overt bleeding.  Suspected immune mediated complications Per patient's oncologist notes there was a suspicion that his symptoms could be due to delayed toxicity from immunotherapy.  Currently on steroids.  History  of coronary artery disease Stable.  Continue aspirin.  DVT Prophylaxis: Lovenox    Code Status: Full code Family Communication: Discussed with the patient.  No family at bedside Disposition Plan: Management as outlined above.  Mobilize with physical therapy.  Change to oral medications.  Anticipate discharge in 24 hours.      LOS: 3 days   Jackalyn Haith [employer] on [email or web address]  03/27/2018, 12:53 PM

## 2018-03-27 NOTE — Progress Notes (Signed)
Jeannette Corpus, NP notified of patient c/o restless legs.  Orders rec'd for 1-time dose of Requip 0.5mg .

## 2018-03-28 LAB — CBC
HCT: 34.3 % — ABNORMAL LOW (ref 39.0–52.0)
Hemoglobin: 11 g/dL — ABNORMAL LOW (ref 13.0–17.0)
MCH: 29.3 pg (ref 26.0–34.0)
MCHC: 32.1 g/dL (ref 30.0–36.0)
MCV: 91.2 fL (ref 80.0–100.0)
Platelets: 276 10*3/uL (ref 150–400)
RBC: 3.76 MIL/uL — AB (ref 4.22–5.81)
RDW: 14.7 % (ref 11.5–15.5)
WBC: 13.4 10*3/uL — ABNORMAL HIGH (ref 4.0–10.5)
nRBC: 0 % (ref 0.0–0.2)

## 2018-03-28 LAB — BASIC METABOLIC PANEL
Anion gap: 7 (ref 5–15)
BUN: 24 mg/dL — ABNORMAL HIGH (ref 8–23)
CO2: 27 mmol/L (ref 22–32)
Calcium: 8.4 mg/dL — ABNORMAL LOW (ref 8.9–10.3)
Chloride: 101 mmol/L (ref 98–111)
Creatinine, Ser: 1.62 mg/dL — ABNORMAL HIGH (ref 0.61–1.24)
GFR calc Af Amer: 47 mL/min — ABNORMAL LOW (ref >60)
GFR calc non Af Amer: 41 mL/min — ABNORMAL LOW (ref >60)
GLUCOSE: 105 mg/dL — AB (ref 70–99)
POTASSIUM: 4.1 mmol/L (ref 3.5–5.1)
Sodium: 135 mmol/L (ref 135–145)

## 2018-03-28 MED ORDER — HYDROCORTISONE 10 MG PO TABS: ORAL_TABLET | ORAL | 1 refills | Status: DC

## 2018-03-28 MED ORDER — LEVOTHYROXINE SODIUM 50 MCG PO TABS: 50.0000 ug | ORAL_TABLET | Freq: Every day | ORAL | 1 refills | Status: DC

## 2018-03-28 NOTE — Progress Notes (Signed)
Physical Therapy Treatment Patient Details Name: [name] MRN: [id number] DOB: 05-16-1945 Today's Date: 03/28/2018    History of Present Illness Pt admitted wtih severe hyponatremia.  Pt is 73 y.o. male  with hx of metastatic clear cell renal cell CA (mets to lung, liver, and peritoneum) on nivolumab (last dose in Nov 2019) who presents with generalized fatigue and loss of appetite.    PT Comments    Patient seen for mobility progression. Patient grossly requiring Min A for all mobility. Noted LOB x 4 with mobility with RW requiring Min A to maintain upright balance from PT. Patient seemingly unaware of deficits/safety awarness with education provided throughout session for safety. Patient to benefit from short term SNF rehab at discharge to progress safe and independent functional mobility. If patient declines will need [facility] services along with 24/hr supervision. PT to continue to follow.     Follow Up Recommendations  SNF(patient my decline - will need HH services and 24/hr sup. )     Equipment Recommendations  None recommended by PT    Recommendations for Other Services       Precautions / Restrictions Precautions Precautions: Fall Restrictions Weight Bearing Restrictions: No    Mobility  Bed Mobility Overal bed mobility: Needs Assistance Bed Mobility: Supine to Sit;Sit to Supine     Supine to sit: Min guard;Min assist Sit to supine: Min guard;Min assist   General bed mobility comments: min guard/A for safety and line management; increased time and effort  Transfers Overall transfer level: Needs assistance Equipment used: Rolling walker (2 wheeled) Transfers: Sit to/from [facility] Sit to Stand: Min assist Stand pivot transfers: Mod assist       General transfer comment: Min A to power up from bedisde; Min A for initial standing balance; cueing for safety throughout; posterior LOB upon initial standing  Ambulation/Gait Ambulation/Gait  assistance: Min assist Gait Distance (Feet): 250 Feet Assistive device: Rolling walker (2 wheeled) Gait Pattern/deviations: Step-through pattern;Decreased step length - right;Decreased step length - left;Shuffle;Trunk flexed;Staggering right;Staggering left Gait velocity: decreased   General Gait Details: Patient with LOB x4 requiring Min A from PT to maintain upright balance; pateint unaware of deficits and safety   Stairs             Wheelchair Mobility    Modified Rankin (Stroke Patients Only)       Balance Overall balance assessment: Needs assistance Sitting-balance support: No upper extremity supported;Feet supported Sitting balance-Leahy Scale: Good     Standing balance support: Bilateral upper extremity supported;During functional activity Standing balance-Leahy Scale: Poor Standing balance comment: requires assist for balance when wt shifting                            Cognition Arousal/Alertness: Awake/alert Behavior During Therapy: WFL for tasks assessed/performed Overall Cognitive Status: Within Functional Limits for tasks assessed                                 General Comments: sleepy, but easily arousable      Exercises      General Comments        Pertinent Vitals/Pain Pain Assessment: No/denies pain    Home Living                      Prior Function  PT Goals (current goals can now be found in the care plan section) Acute Rehab PT Goals Patient Stated Goal: home PT Goal Formulation: With patient/family Time For Goal Achievement: 03/28/18 Potential to Achieve Goals: Good Progress towards PT goals: Progressing toward goals    Frequency    Min 3X/week      PT Plan Current plan remains appropriate    Co-evaluation              AM-PAC PT "6 Clicks" Mobility   Outcome Measure  Help needed turning from your back to your side while in a flat bed without using bedrails?: A  Little Help needed moving from lying on your back to sitting on the side of a flat bed without using bedrails?: A Little Help needed moving to and from a bed to a chair (including a wheelchair)?: A Little Help needed standing up from a chair using your arms (e.g., wheelchair or bedside chair)?: A Little Help needed to walk in hospital room?: A Little Help needed climbing 3-5 steps with a railing? : A Lot 6 Click Score: 17    End of Session Equipment Utilized During Treatment: Gait belt Activity Tolerance: Patient tolerated treatment well Patient left: in bed;with call bell/phone within reach Nurse Communication: Mobility status PT Visit Diagnosis: Difficulty in walking, not elsewhere classified (R26.2);Unsteadiness on feet (R26.81)     Time: 1050-1105 PT Time Calculation (min) (ACUTE ONLY): 15 min  Charges:  $Gait Training: 8-22 mins                     Lanney Gins, PT, DPT Supplemental Physical Therapist 03/28/18 11:15 AM Pager: [phone] Office: [phone]

## 2018-03-28 NOTE — Discharge Instructions (Signed)
Hyponatremia  Hyponatremia is when the amount of salt (sodium) in your blood is too low. When salt levels are low, your cells absorb extra water and they swell. The swelling happens throughout the body, but it mostly affects the brain. Follow these instructions at home:  Take medicines only as told by your doctor. Many medicines can make this condition worse. Talk with your doctor about any medicines that you are currently taking.  Carefully follow a recommended diet as told by your doctor.  Carefully follow instructions from your doctor about fluid restrictions.  Keep all follow-up visits as told by your doctor. This is important.  Do not drink alcohol. Contact a doctor if:  You feel sicker to your stomach (nauseous).  You feel more confused.  You feel more tired (fatigued).  Your headache gets worse.  You feel weaker.  Your symptoms go away and then they come back.  You have trouble following the diet instructions. Get help right away if:  You start to twitch and shake (have a seizure).  You pass out (faint).  You keep having watery poop (diarrhea).  You keep throwing up (vomiting). This information is not intended to replace advice given to you by your health care provider. Make sure you discuss any questions you have with your health care provider. Document Released: 10/15/2010 Document Revised: 07/11/2015 Document Reviewed: 01/29/2014 Elsevier Interactive Patient Education  2019 [employer].   Hypothyroidism  Hypothyroidism is when the thyroid gland does not make enough of certain hormones (it is underactive). The thyroid gland is a small gland located in the lower front part of the neck, just in front of the windpipe (trachea). This gland makes hormones that help control how the body uses food for energy (metabolism) as well as how the heart and brain function. These hormones also play a role in keeping your bones strong. When the thyroid is underactive, it  produces too little of the hormones thyroxine (T4) and triiodothyronine (T3). What are the causes? This condition may be caused by:  Hashimoto's disease. This is a disease in which the body's disease-fighting system (immune system) attacks the thyroid gland. This is the most common cause.  Viral infections.  Pregnancy.  Certain medicines.  Birth defects.  Past radiation treatments to the head or neck for cancer.  Past treatment with radioactive iodine.  Past exposure to radiation in the environment.  Past surgical removal of part or all of the thyroid.  Problems with a gland in the center of the brain (pituitary gland).  Lack of enough iodine in the diet. What increases the risk? You are more likely to develop this condition if:  You are male.  You have a family history of thyroid conditions.  You use a medicine called lithium.  You take medicines that affect the immune system (immunosuppressants). What are the signs or symptoms? Symptoms of this condition include:  Feeling as though you have no energy (lethargy).  Not being able to tolerate cold.  Weight gain that is not explained by a change in diet or exercise habits.  Lack of appetite.  Dry skin.  Coarse hair.  Menstrual irregularity.  Slowing of thought processes.  Constipation.  Sadness or depression. How is this diagnosed? This condition may be diagnosed based on:  Your symptoms, your medical history, and a physical exam.  Blood tests. You may also have imaging tests, such as an ultrasound or MRI. How is this treated? This condition is treated with medicine that replaces the thyroid  hormones that your body does not make. After you begin treatment, it may take several weeks for symptoms to go away. Follow these instructions at home:  Take over-the-counter and prescription medicines only as told by your health care provider.  If you start taking any new medicines, tell your health care  provider.  Keep all follow-up visits as told by your health care provider. This is important. ? As your condition improves, your dosage of thyroid hormone medicine may change. ? You will need to have blood tests regularly so that your health care provider can monitor your condition. Contact a health care provider if:  Your symptoms do not get better with treatment.  You are taking thyroid replacement medicine and you: ? Sweat a lot. ? Have tremors. ? Feel anxious. ? Lose weight rapidly. ? Cannot tolerate heat. ? Have emotional swings. ? Have diarrhea. ? Feel weak. Get help right away if you have:  Chest pain.  An irregular heartbeat.  A rapid heartbeat.  Difficulty breathing. Summary  Hypothyroidism is when the thyroid gland does not make enough of certain hormones (it is underactive).  When the thyroid is underactive, it produces too little of the hormones thyroxine (T4) and triiodothyronine (T3).  The most common cause is Hashimoto's disease, a disease in which the body's disease-fighting system (immune system) attacks the thyroid gland. The condition can also be caused by viral infections, medicine, pregnancy, or past radiation treatment to the head or neck.  Symptoms may include weight gain, dry skin, constipation, feeling as though you do not have energy, and not being able to tolerate cold.  This condition is treated with medicine to replace the thyroid hormones that your body does not make. This information is not intended to replace advice given to you by your health care provider. Make sure you discuss any questions you have with your health care provider. Document Released: 02/02/2005 Document Revised: 01/13/2017 Document Reviewed: 01/13/2017 Elsevier Interactive Patient Education  2019 [employer].

## 2018-03-28 NOTE — Progress Notes (Signed)
Pt was discharged home today. Instructions were reviewed with patient and his daughter. All questions were answered. Pt was taken to main entrance via wheelchair by NT.

## 2018-03-28 NOTE — Progress Notes (Signed)
Spoke to [facility] to provide f/u while @ home-they will contact patient/dtr.

## 2018-03-28 NOTE — Care Management Note (Signed)
Case Management Note  Patient Details  Name: [name] MRN: [id number] Date of Birth: Dec 03, 1945  Subjective/Objective: [place] orders-CM has contacted several [place], [place] they are not in network with the insurance or unable to go to [place] area. Informed patient/dtr [name]-they still wanto to go home & have declined SNF. MD/Nsg updated.                   Action/Plan:d/c home.   Expected Discharge Date:  03/28/18               Expected Discharge Plan:  [place]  In-House Referral:     Discharge planning Services  CM Consult  Post Acute Care Choice:  Durable Medical Equipment(rw) Choice offered to:  Patient  DME Arranged:    DME Agency:     HH Arranged:    [place] Agency:     Status of Service:  Completed, signed off  If discussed at [employer] of Stay Meetings, dates discussed:    Additional Comments:  Dessa Phi, RN 03/28/2018, 2:44 PM

## 2018-03-28 NOTE — Discharge Summary (Signed)
[facility]  Physician Discharge Summary   Patient ID: [name] MRN: [id number] DOB/AGE: 09/26/1945 73 y.o.  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 Outpatient referral sent to [facility] endocrinology for further work-up of his adrenal insufficiency and hypothyroidism 2. Patient will need to have his thyroid function test rechecked in the next 2 to 3 weeks.  This can be done by the endocrinology office.  If he is not able to see them in this timeframe, PCP may need to proceed with this.   Home Health: Home health RN, PT, OT, aide Equipment/Devices: None  CODE STATUS: Full code  DISCHARGE CONDITION: fair  Diet recommendation: Low-sodium  INITIAL HISTORY: 73 y.o.malewith medical history significant ofstage IV renal cell carcinoma, initially localized and diagnosed in 2016, status post radical nephrectomy 2016, now with stage IV with omental mets as well as retrocaval lymph node involvement, and also has potentiallymetastatic lesion in the liver. Onmost recent CT scan in January 2020 did not show any progression of the disease.He also received nivolumab as well as Iiplimumab, completed 4 cycles of therapy. Also has a history of coronary artery disease, hyperlipidemia, hypertension.   He was sent over to the emergency department by his oncologist for a sodium level of 116.  Patient was hospitalized for further management.     HOSPITAL COURSE:   Hyponatremia and hyperkalemia Etiology multifactorial including profound hypothyroidism, adrenal insufficiency, SIADH and poor oral intake.  Serum  osmolality 245.  Urine osmolality 460.  Urine sodium 69.  Fractional excretion of sodium is 0.53%.  Have a low plasma cortisol level of 2.4.  ACTH stimulation test showed suboptimal response from 2.2 to 8.6.  Due to concerns for hypovolemia patient was placed on IV fluids.    Sodium level gradually improved.  Normal for the last 2 days.  Potassium was mildly elevated at the time of admission and has been normal the last few days.     Adrenal insufficiency Patient had a suboptimal response to the ACTH stimulation test.  Patient was placed on dexamethasone.  Currently transition to hydrocortisone which he is tolerating orally. Will defer the matter of mineralocorticoid replacement to the outpatient setting.  He will need to be seen by endocrinology.     Referral has been sent to [facility] endocrinology.   Severe hypothyroidism TSH noted to be significantly elevated at 86.  Free T4 was less than 0.25.  Patient started on IV Synthroid.  With improvement in patient's mentation this was transitioned over to oral.  He will need outpatient followup and repeat thyroid function tests in a few weeks..  Suspected immune mediated complications Per patient's oncologist notes there was a suspicion that his symptoms could be due to delayed toxicity from immunotherapy.  Currently on steroids.  Nocturnal hypoxia This was noted once. Has not had any further episodes.  Saturating normal on room air now.  Will defer to outpatient providers.  Chronic kidney disease stage III Baseline creatinine 1.8.  Creatinine is stable.  Holding lisinopril.  Avoid nephrotoxic agents.    Metastatic renal cell cancer MRI brain was considered due to reports of worsening balance at home.  However patient has experienced trouble with MRI previously so he refused to do at the time of  admission.    He mentions that he has clips in his chest from previous surgery to his lungs.  When he last underwent MRI it caused burning sensation in his chest  and they had to remove him from the MRI machine urgently.  So he refuses to undergo MRI at this time.  Will defer neuroimaging to his outpatient providers.  He does not have any focal neurological deficits.  Denies any headaches.  History of essential hypertension Blood pressure has been well controlled off of his antihypertensives.  We will hold his lisinopril due to elevated creatinine.    Small pericardial effusion This was noted on previous hospitalization.  Could be malignant.  Could be due to hypothyroidism. No hemodynamic compromise noted.  Will need continued monitoring in the outpatient setting.  Normocytic anemia Likely due to chronic disease.  No evidence for overt bleeding.  History of coronary artery disease Stable.  Continue aspirin.  Overall stable.  Okay for discharge today.  Seen by physical therapy who recommended 24-hour supervision versus skilled nursing facility.  Discussed with patient's daughter.  She is discussed this issue with the patient.  He does not want to go to skilled nursing facility.  He will go home with his daughter for the next few days.  Home health will be ordered.   PERTINENT LABS:  The results of significant diagnostics from this hospitalization (including imaging, microbiology, ancillary and laboratory) are listed below for reference.    Microbiology: Recent Results (from the past 240 hour(s))  MRSA PCR Screening     Status: None   Collection Time: 03/24/18  7:23 PM  Result Value Ref Range Status   MRSA by PCR NEGATIVE NEGATIVE Final    Comment:        The GeneXpert MRSA Assay (FDA approved for NASAL specimens only), is one component of a comprehensive MRSA colonization surveillance program. It is not intended to diagnose MRSA infection nor to guide or monitor treatment for MRSA infections. Performed at [facility], [place] [address] [place], [place] [address]      Labs: Basic Metabolic Panel: Recent Labs  Lab  03/25/18 1150 03/25/18 1930 03/26/18 0314 03/27/18 0454 03/28/18 0432  NA 123* 124* 129* 135 135  K 5.1 4.9 4.6 4.3 4.1  CL 92* 92* 97* 102 101  CO2 19* 23 22 24 27   GLUCOSE 147* 167* 143* 127* 105*  BUN 20 21 21 21  24*  CREATININE 1.59* 1.64* 1.70* 1.66* 1.62*  CALCIUM 8.8* 8.8* 8.9 8.7* 8.4*   Liver Function Tests: Recent Labs  Lab 03/22/18 1327 03/24/18 0818 03/24/18 1039  AST 58* 68* 69*  ALT 16 21 22   ALKPHOS 45 40 35*  BILITOT 0.8 0.7 0.8  PROT 7.6 7.5 7.6  ALBUMIN 4.2 4.3 4.6   CBC: Recent Labs  Lab 03/22/18 1327 03/24/18 0818 03/24/18 1039 03/26/18 0314 03/27/18 0454 03/28/18 0432  WBC 9.1 6.3 6.5 16.0* 14.9* 13.4*  NEUTROABS 5.4 3.0 3.0  --   --   --   HGB 12.1* 12.8* 12.5* 11.7* 10.9* 11.0*  HCT 35.1* 35.1* 35.3* 34.5* 33.6* 34.3*  MCV 82.2 79.4* 82.9 83.7 88.4 91.2  PLT 254 246 252 309 307 276     IMAGING STUDIES Ct Abdomen Pelvis Wo Contrast  Result Date: 03/04/2018 CLINICAL DATA:  Right renal cell carcinoma metastatic to the lungs. Ongoing chemotherapy. Remote history of right lung cancer. EXAM: CT CHEST, ABDOMEN AND PELVIS WITHOUT CONTRAST TECHNIQUE: Multidetector CT imaging of the chest, abdomen  and pelvis was performed following the standard protocol without IV contrast. COMPARISON:  12/14/2017 FINDINGS: CT CHEST FINDINGS Cardiovascular: Ascending thoracic aortic ectasia at 3.9 cm. Coronary, aortic arch, and branch vessel atherosclerotic vascular disease. New small pericardial effusion. Mediastinum/Nodes: Postoperative findings from prior right lobectomy. Anterior pericardial node 0.7 cm in short axis on image 42/2, formerly 0.6 cm. Lungs/Pleura: Right lower lobectomy and likely right middle lobectomy in the past. Left lower lobe nodule 0.8 by 0.8 cm, formerly the same. Stable 0.3 cm left lower lobe nodule on image 105/6. Musculoskeletal: Old right rib deformities from prior fractures. Chondrocalcinosis. Mild thoracic spondylosis. CT ABDOMEN PELVIS  FINDINGS Hepatobiliary: Chronic 5.0 by 3.7 cm hypodense lesion along the anterior margin of the left hepatic lobe. No new liver lesion is appreciated on today's noncontrast exam. Prior cholecystectomy. Prior lesion posteriorly in segment 6 of the liver is only faintly seen and appears roughly similar to prior exam, about 1.1 by 0.7 cm. Pancreas: Unremarkable pancreatic contour. Spleen: Unremarkable Adrenals/Urinary Tract: Right nephrectomy with ring density around adipose tissue along the resection site, similar to prior and probably from fat packing. Adrenal glands normal. Left kidney appears unremarkable. No urinary tract calculi. Stomach/Bowel: Considerable sigmoid colon diverticulosis with scattered diverticula in the descending colon. Vascular/Lymphatic: Aortoiliac atherosclerotic vascular disease. Peripancreatic node borderline enlarged at 1.0 cm in short axis, formerly 0.9 cm. Retrocaval lymph node previously measuring 1.1 cm in short axis currently measures 0.7 cm in short axis. Reproductive: Prostatomegaly. Other: The left omental tumor implant measures 1.6 by 0.8 cm, formerly 1.9 by 1.2 cm. Musculoskeletal: Grade 1 anterolisthesis at L4-5. Lumbar spondylosis and degenerative disc disease causing bilateral foraminal impingement at L4-5 and L5-S1. There is likely a synovial cyst or disc protrusion in the right neural foramen at L5-S1 containing nitrogen. IMPRESSION: 1. General improvement, with reduction in size of the left omental tumor implants and of the retrocaval lymph node compared to the prior exam. The 8 mm nodule in the left lower lobe is stable. The small metastatic lesion posteriorly in segment 6 of the liver is only faintly seen on today's exam, and mildly improved in size. No new metastatic lesions are identified. 2. New small pericardial effusion, significance uncertain. 3. Other imaging findings of potential clinical significance: Ascending aortic ectasia. Aortic Atherosclerosis (ICD10-I70.0).  Coronary atherosclerosis. Chondrocalcinosis raising the possibility of CPPD arthropathy. Prior right nephrectomy. Sigmoid diverticulosis. Prostatomegaly. Lower lumbar impingement at L4-5 at L5-S1. Electronically Signed   By: Van Clines M.D.   On: 03/04/2018 10:33   Dg Chest 2 View  Result Date: 03/13/2018 CLINICAL DATA:  Weakness EXAM: CHEST - 2 VIEW COMPARISON:  08/19/2015 FINDINGS: Chronic right pleural and parenchymal scarring with postsurgical changes in the right hilar area. No acute consolidation or effusion. Stable cardiomediastinal silhouette. No pneumothorax. IMPRESSION: No active cardiopulmonary disease. Stable right postsurgical changes Electronically Signed   By: Donavan Foil M.D.   On: 03/13/2018 23:18   Ct Chest Wo Contrast  Result Date: 03/04/2018 CLINICAL DATA:  Right renal cell carcinoma metastatic to the lungs. Ongoing chemotherapy. Remote history of right lung cancer. EXAM: CT CHEST, ABDOMEN AND PELVIS WITHOUT CONTRAST TECHNIQUE: Multidetector CT imaging of the chest, abdomen and pelvis was performed following the standard protocol without IV contrast. COMPARISON:  12/14/2017 FINDINGS: CT CHEST FINDINGS Cardiovascular: Ascending thoracic aortic ectasia at 3.9 cm. Coronary, aortic arch, and branch vessel atherosclerotic vascular disease. New small pericardial effusion. Mediastinum/Nodes: Postoperative findings from prior right lobectomy. Anterior pericardial node 0.7 cm in short axis on image  42/2, formerly 0.6 cm. Lungs/Pleura: Right lower lobectomy and likely right middle lobectomy in the past. Left lower lobe nodule 0.8 by 0.8 cm, formerly the same. Stable 0.3 cm left lower lobe nodule on image 105/6. Musculoskeletal: Old right rib deformities from prior fractures. Chondrocalcinosis. Mild thoracic spondylosis. CT ABDOMEN PELVIS FINDINGS Hepatobiliary: Chronic 5.0 by 3.7 cm hypodense lesion along the anterior margin of the left hepatic lobe. No new liver lesion is appreciated on  today's noncontrast exam. Prior cholecystectomy. Prior lesion posteriorly in segment 6 of the liver is only faintly seen and appears roughly similar to prior exam, about 1.1 by 0.7 cm. Pancreas: Unremarkable pancreatic contour. Spleen: Unremarkable Adrenals/Urinary Tract: Right nephrectomy with ring density around adipose tissue along the resection site, similar to prior and probably from fat packing. Adrenal glands normal. Left kidney appears unremarkable. No urinary tract calculi. Stomach/Bowel: Considerable sigmoid colon diverticulosis with scattered diverticula in the descending colon. Vascular/Lymphatic: Aortoiliac atherosclerotic vascular disease. Peripancreatic node borderline enlarged at 1.0 cm in short axis, formerly 0.9 cm. Retrocaval lymph node previously measuring 1.1 cm in short axis currently measures 0.7 cm in short axis. Reproductive: Prostatomegaly. Other: The left omental tumor implant measures 1.6 by 0.8 cm, formerly 1.9 by 1.2 cm. Musculoskeletal: Grade 1 anterolisthesis at L4-5. Lumbar spondylosis and degenerative disc disease causing bilateral foraminal impingement at L4-5 and L5-S1. There is likely a synovial cyst or disc protrusion in the right neural foramen at L5-S1 containing nitrogen. IMPRESSION: 1. General improvement, with reduction in size of the left omental tumor implants and of the retrocaval lymph node compared to the prior exam. The 8 mm nodule in the left lower lobe is stable. The small metastatic lesion posteriorly in segment 6 of the liver is only faintly seen on today's exam, and mildly improved in size. No new metastatic lesions are identified. 2. New small pericardial effusion, significance uncertain. 3. Other imaging findings of potential clinical significance: Ascending aortic ectasia. Aortic Atherosclerosis (ICD10-I70.0). Coronary atherosclerosis. Chondrocalcinosis raising the possibility of CPPD arthropathy. Prior right nephrectomy. Sigmoid diverticulosis.  Prostatomegaly. Lower lumbar impingement at L4-5 at L5-S1. Electronically Signed   By: Van Clines M.D.   On: 03/04/2018 10:33   Dg Abd 2 Views  Result Date: 03/14/2018 CLINICAL DATA:  73 year old male with a history of nausea. Known carcinoma EXAM: ABDOMEN - 2 VIEW COMPARISON:  Abdominal CT 03/04/2018 FINDINGS: Gas within stomach, small bowel, colon without abnormal distention. Evidence of diverticula. Dystrophic calcifications of the right abdomen, compatible with findings on prior CT. Cholecystectomy. Changes at the right lung base of hemidiaphragm elevation and atelectasis/pleural fluid. No acute displaced fracture. IMPRESSION: Nonobstructive bowel gas pattern. Electronically Signed   By: Corrie Mckusick D.O.   On: 03/14/2018 20:13    DISCHARGE EXAMINATION: Vitals:   03/27/18 0620 03/27/18 1344 03/27/18 2123 03/28/18 0552  BP: 139/88 126/74 126/77 129/79  Pulse: 68 68 61 63  Resp: 18 17 18 18   Temp: 97.9 F (36.6 C) 97.9 F (36.6 C) 97.9 F (36.6 C) 97.8 F (36.6 C)  TempSrc: Oral Oral Oral Oral  SpO2: 100% 97% 96% 96%  Weight:      Height:       General appearance: alert, cooperative, appears stated age and no distress Resp: clear to auscultation bilaterally Cardio: regular rate and rhythm, S1, S2 normal, no murmur, click, rub or gallop GI: soft, non-tender; bowel sounds normal; no masses,  no organomegaly  DISPOSITION: Home with daughter  Discharge Instructions    Call MD for:  difficulty  breathing, headache or visual disturbances   Complete by:  As directed    Call MD for:  extreme fatigue   Complete by:  As directed    Call MD for:  persistant dizziness or light-headedness   Complete by:  As directed    Call MD for:  persistant nausea and vomiting   Complete by:  As directed    Call MD for:  severe uncontrolled pain   Complete by:  As directed    Call MD for:  temperature >100.4   Complete by:  As directed    Diet - low sodium heart healthy   Complete by:  As  directed    Discharge instructions   Complete by:  As directed    Patient instructed to follow-up with PCP in 7 to 10 days.  Referral has been sent to [facility] endocrinology.  They will contact the patient for an appointment.  Patient to also follow-up with his oncologist.  You were cared for by a hospitalist during your hospital stay. If you have any questions about your discharge medications or the care you received while you were in the hospital after you are discharged, you can call the unit and asked to speak with the hospitalist on call if the hospitalist that took care of you is not available. Once you are discharged, your primary care physician will handle any further medical issues. Please note that NO REFILLS for any discharge medications will be authorized once you are discharged, as it is imperative that you return to your primary care physician (or establish a relationship with a primary care physician if you do not have one) for your aftercare needs so that they can reassess your need for medications and monitor your lab values. If you do not have a primary care physician, you can call [phone] for a physician referral.   Increase activity slowly   Complete by:  As directed         Allergies as of 03/28/2018      Reactions   Benadryl [diphenhydramine] Other (See Comments)   Severe restless legs      Medication List    STOP taking these medications   lisinopril 20 MG tablet Commonly known as:  PRINIVIL,ZESTRIL   predniSONE 10 MG tablet Commonly known as:  DELTASONE     TAKE these medications   acetaminophen 325 MG tablet Commonly known as:  TYLENOL Take 2 tablets (650 mg total) by mouth every 6 (six) hours as needed for mild pain or headache (over the counter).   aspirin 81 MG chewable tablet Chew 81 mg by mouth daily.   hydrocortisone 10 MG tablet Commonly known as:  CORTEF Take 20mg  every morning at 8am and 10mg  every evening at 6pm.   levothyroxine 50 MCG  tablet Commonly known as:  SYNTHROID, LEVOTHROID Take 1 tablet (50 mcg total) by mouth daily at 6 (six) AM. Start taking on:  March 29, 2018   multivitamin with minerals Tabs tablet Take 1 tablet by mouth daily.   pantoprazole 40 MG tablet Commonly known as:  PROTONIX Take 1 tablet (40 mg total) by mouth daily at 6 (six) AM. What changed:    when to take this  reasons to take this   prochlorperazine 10 MG tablet Commonly known as:  COMPAZINE Take 1 tablet (10 mg total) by mouth every 6 (six) hours as needed for nausea or vomiting.   temazepam 7.5 MG capsule Commonly known as:  RESTORIL Take 1 capsule (7.5  mg total) by mouth at bedtime as needed for sleep.        Follow-up Information    [facility] Follow up.   Specialty:  Internal Medicine Why:  Referral has been sent to this office.  You should hear from them in the next 1 to 2 days. Contact information: [address], [place] [address] [phone]       [name], MD. Schedule an appointment as soon as possible for a visit in 1 week(s).   Specialty:  Family Medicine Contact information: [place] [address] [phone]           TOTAL DISCHARGE TIME: 35 mins  [facility] Pager on [email or web address]  03/28/2018, 1:14 PM

## 2018-03-28 NOTE — Consult Note (Addendum)
   [facility] CM Inpatient Consult   03/28/2018  [name] [birth year] [id number]    Received telephone call from inpatient RNCM to request [facility] follow up for high unplanned hospital readmission risk.  Inpatient RNCM facilitated telephone call conversation on her speaker phone with [name] and his daughter [name]. This Probation officer explained [facility] services. [name] and daughter are agreeable. They are familiar with [facility] because patient's wife was followed by [facility] for quite some time.  [name] (daughter) request to be contacted post hospital discharge at [phone]. [name] is agreeable to this.   Verbal consent obtained for [facility] Care Management services.  Addendum: After further review, noted Mr. [facility] Primary Care Provider, Dr. Claris Gower is not a [facility] Provider. Therefore, [facility] Care Management can not follow after all.   Telephone call made to [name] to make aware. Telephone call to inpatient RNCM to make aware as well.    Marthenia Rolling, MSN-Ed, RN,BSN [facility] Liaison [phone]

## 2018-03-31 DIAGNOSIS — E278 Other specified disorders of adrenal gland: Secondary | ICD-10-CM | POA: Diagnosis not present

## 2018-03-31 DIAGNOSIS — I10 Essential (primary) hypertension: Secondary | ICD-10-CM | POA: Diagnosis not present

## 2018-04-04 ENCOUNTER — Ambulatory Visit: Payer: Medicare HMO | Admitting: Endocrinology

## 2018-04-06 ENCOUNTER — Telehealth: Payer: Self-pay | Admitting: Oncology

## 2018-04-06 NOTE — Telephone Encounter (Signed)
Scheduled appt per 2/19 sch message - pt daughter is aware of appt date and time

## 2018-04-08 ENCOUNTER — Inpatient Hospital Stay: Payer: Medicare HMO | Admitting: Oncology

## 2018-04-08 ENCOUNTER — Inpatient Hospital Stay: Payer: Medicare HMO

## 2018-04-08 ENCOUNTER — Telehealth: Payer: Self-pay | Admitting: Oncology

## 2018-04-08 VITALS — BP 120/56 | HR 61 | Temp 97.8°F | Resp 17 | Ht 69.0 in | Wt 186.8 lb

## 2018-04-08 DIAGNOSIS — I951 Orthostatic hypotension: Secondary | ICD-10-CM | POA: Diagnosis not present

## 2018-04-08 DIAGNOSIS — Z79899 Other long term (current) drug therapy: Secondary | ICD-10-CM | POA: Diagnosis not present

## 2018-04-08 DIAGNOSIS — R627 Adult failure to thrive: Secondary | ICD-10-CM

## 2018-04-08 DIAGNOSIS — E860 Dehydration: Secondary | ICD-10-CM | POA: Diagnosis not present

## 2018-04-08 DIAGNOSIS — Z905 Acquired absence of kidney: Secondary | ICD-10-CM | POA: Diagnosis not present

## 2018-04-08 DIAGNOSIS — C649 Malignant neoplasm of unspecified kidney, except renal pelvis: Secondary | ICD-10-CM

## 2018-04-08 DIAGNOSIS — C786 Secondary malignant neoplasm of retroperitoneum and peritoneum: Secondary | ICD-10-CM | POA: Diagnosis not present

## 2018-04-08 DIAGNOSIS — I10 Essential (primary) hypertension: Secondary | ICD-10-CM

## 2018-04-08 DIAGNOSIS — Z87891 Personal history of nicotine dependence: Secondary | ICD-10-CM | POA: Diagnosis not present

## 2018-04-08 DIAGNOSIS — C641 Malignant neoplasm of right kidney, except renal pelvis: Secondary | ICD-10-CM | POA: Diagnosis not present

## 2018-04-08 DIAGNOSIS — E039 Hypothyroidism, unspecified: Secondary | ICD-10-CM | POA: Diagnosis not present

## 2018-04-08 DIAGNOSIS — K5900 Constipation, unspecified: Secondary | ICD-10-CM

## 2018-04-08 DIAGNOSIS — C787 Secondary malignant neoplasm of liver and intrahepatic bile duct: Secondary | ICD-10-CM

## 2018-04-08 DIAGNOSIS — Z7982 Long term (current) use of aspirin: Secondary | ICD-10-CM

## 2018-04-08 DIAGNOSIS — C7800 Secondary malignant neoplasm of unspecified lung: Secondary | ICD-10-CM | POA: Diagnosis not present

## 2018-04-08 DIAGNOSIS — E871 Hypo-osmolality and hyponatremia: Secondary | ICD-10-CM

## 2018-04-08 DIAGNOSIS — G4700 Insomnia, unspecified: Secondary | ICD-10-CM

## 2018-04-08 LAB — CBC WITH DIFFERENTIAL (CANCER CENTER ONLY)
Abs Immature Granulocytes: 0.09 10*3/uL — ABNORMAL HIGH (ref 0.00–0.07)
Basophils Absolute: 0 10*3/uL (ref 0.0–0.1)
Basophils Relative: 0 %
Eosinophils Absolute: 0.1 10*3/uL (ref 0.0–0.5)
Eosinophils Relative: 1 %
HCT: 34.7 % — ABNORMAL LOW (ref 39.0–52.0)
Hemoglobin: 11.3 g/dL — ABNORMAL LOW (ref 13.0–17.0)
IMMATURE GRANULOCYTES: 1 %
Lymphocytes Relative: 11 %
Lymphs Abs: 1.4 10*3/uL (ref 0.7–4.0)
MCH: 29.6 pg (ref 26.0–34.0)
MCHC: 32.6 g/dL (ref 30.0–36.0)
MCV: 90.8 fL (ref 80.0–100.0)
Monocytes Absolute: 0.5 10*3/uL (ref 0.1–1.0)
Monocytes Relative: 4 %
Neutro Abs: 10 10*3/uL — ABNORMAL HIGH (ref 1.7–7.7)
Neutrophils Relative %: 83 %
Platelet Count: 190 10*3/uL (ref 150–400)
RBC: 3.82 MIL/uL — ABNORMAL LOW (ref 4.22–5.81)
RDW: 14.8 % (ref 11.5–15.5)
WBC Count: 12.1 10*3/uL — ABNORMAL HIGH (ref 4.0–10.5)
nRBC: 0 % (ref 0.0–0.2)

## 2018-04-08 LAB — CMP (CANCER CENTER ONLY)
ALK PHOS: 48 U/L (ref 38–126)
ALT: 11 U/L (ref 0–44)
AST: 13 U/L — ABNORMAL LOW (ref 15–41)
Albumin: 4.2 g/dL (ref 3.5–5.0)
Anion gap: 12 (ref 5–15)
BILIRUBIN TOTAL: 0.5 mg/dL (ref 0.3–1.2)
BUN: 26 mg/dL — ABNORMAL HIGH (ref 8–23)
CO2: 24 mmol/L (ref 22–32)
Calcium: 9.1 mg/dL (ref 8.9–10.3)
Chloride: 101 mmol/L (ref 98–111)
Creatinine: 1.74 mg/dL — ABNORMAL HIGH (ref 0.61–1.24)
GFR, Est AFR Am: 43 mL/min — ABNORMAL LOW (ref >60)
GFR, Estimated: 38 mL/min — ABNORMAL LOW (ref >60)
Glucose, Bld: 209 mg/dL — ABNORMAL HIGH (ref 70–99)
Potassium: 4 mmol/L (ref 3.5–5.1)
Sodium: 137 mmol/L (ref 135–145)
TOTAL PROTEIN: 7.4 g/dL (ref 6.5–8.1)

## 2018-04-08 NOTE — Progress Notes (Signed)
Hematology and Oncology Follow Up Visit  [name] [id number] 25-Aug-1945 73 y.o. 04/08/2018 1:14 PM [name], MDElkins, [name], *   Principle Diagnosis: 73 year old man with renal cell carcinoma diagnosed in May 2019.  He was found to have stage IV disease with hepatic metastasis.  He was initially diagnosed in 2016 with localized disease.   Prior Therapy: He underwent a radical nephrectomy completed by [name] on February 01, 2015.  The final pathology revealed a 3.3 cm clear cell renal cell carcinoma with Fuhrman grade 3.  Margins were negative indicating stage T1 a disease  Nivolumab at 3 mg/mg and ipilimumab at 1 mg/kg cycle 1 started on 09/21/2017.  He completed 4 cycles of therapy.     Current therapy: Active surveillance.  Interim History: [name] is here for a follow-up visit.  Since the last visit, he was hospitalized briefly for hyponatremia as well as failure to thrive.  His work-up revealed hypothyroidism as well as adrenal insufficiency and has been discharged on replacement thyroid disease as well as hydrocortisone.  Since his discharge, he reports feeling very well and has resumed most activities of daily living.  He is eating better and noticed improvement in his overall performance status.  He denies any confusion, lethargy or falls.  His appetite is improved and has gained weight.   Patient denied headaches, blurry vision, syncope or seizures.  Denies any fevers, chills or sweats.  Denied chest pain, palpitation, orthopnea or leg edema.  Denied cough, wheezing or hemoptysis.  Denied nausea, vomiting or abdominal pain.  Denies any constipation or diarrhea.  Denies any frequency urgency or hesitancy.  Denies any arthralgias or myalgias.  Denies any skin rashes or lesions.  Denies any bleeding or clotting tendency.  Denies any easy bruising.  Denies any hair or nail changes.  Denies any anxiety or depression.  Remaining review of system is  negative.  .      Medications: I have reviewed the patient's current medications.  Current Outpatient Medications  Medication Sig Dispense Refill  . acetaminophen (TYLENOL) 325 MG tablet Take 2 tablets (650 mg total) by mouth every 6 (six) hours as needed for mild pain or headache (over the counter). 30 tablet 3  . aspirin 81 MG chewable tablet Chew 81 mg by mouth daily.    . hydrocortisone (CORTEF) 10 MG tablet Take 20mg  every morning at 8am and 10mg  every evening at 6pm. 90 tablet 1  . levothyroxine (SYNTHROID, LEVOTHROID) 50 MCG tablet Take 1 tablet (50 mcg total) by mouth daily at 6 (six) AM. 30 tablet 1  . Multiple Vitamin (MULTIVITAMIN WITH MINERALS) TABS tablet Take 1 tablet by mouth daily. 30 tablet 3  . pantoprazole (PROTONIX) 40 MG tablet Take 1 tablet (40 mg total) by mouth daily at 6 (six) AM. (Patient taking differently: Take 40 mg by mouth daily as needed (reflux). ) 30 tablet 3  . prochlorperazine (COMPAZINE) 10 MG tablet Take 1 tablet (10 mg total) by mouth every 6 (six) hours as needed for nausea or vomiting. 30 tablet 0  . temazepam (RESTORIL) 7.5 MG capsule Take 1 capsule (7.5 mg total) by mouth at bedtime as needed for sleep. 30 capsule 0   No current facility-administered medications for this visit.      Allergies:  Allergies  Allergen Reactions  . Benadryl [Diphenhydramine] Other (See Comments)    Severe restless legs    Past Medical History, Surgical history, Social history, and Family History were reviewed and updated.  Physical Exam:  Blood pressure (!) 120/56, pulse 61, temperature 97.8 F (36.6 C), temperature source Oral, resp. rate 17, height 5\' 9"  (1.753 m), weight 186 lb 12.8 oz (84.7 kg), SpO2 99 %.    ECOG: 1    General appearance: Comfortable appearing without any discomfort Head: Normocephalic without any trauma Oropharynx: Mucous membranes are moist and pink without any thrush or ulcers. Eyes: Pupils are equal and round reactive  to light. Lymph nodes: No cervical, supraclavicular, inguinal or axillary lymphadenopathy.   Heart:regular rate and rhythm.  S1 and S2 without leg edema. Lung: Clear without any rhonchi or wheezes.  No dullness to percussion. Abdomin: Soft, nontender, nondistended with good bowel sounds.  No hepatosplenomegaly. Musculoskeletal: No joint deformity or effusion.  Full range of motion noted. Neurological: No deficits noted on motor, sensory and deep tendon reflex exam. Skin: No petechial rash or dryness.  Appeared moist.  Psychiatric: Mood and affect appeared appropriate.       Lab Results: Lab Results  Component Value Date   WBC 13.4 (H) 03/28/2018   HGB 11.0 (L) 03/28/2018   HCT 34.3 (L) 03/28/2018   MCV 91.2 03/28/2018   PLT 276 03/28/2018     Chemistry      Component Value Date/Time   NA 135 03/28/2018 0432   NA 134 03/11/2018 0913   K 4.1 03/28/2018 0432   CL 101 03/28/2018 0432   CO2 27 03/28/2018 0432   BUN 24 (H) 03/28/2018 0432   BUN 17 03/11/2018 0913   CREATININE 1.62 (H) 03/28/2018 0432   CREATININE 1.60 (H) 03/24/2018 0818      Component Value Date/Time   CALCIUM 8.4 (L) 03/28/2018 0432   ALKPHOS 35 (L) 03/24/2018 1039   AST 69 (H) 03/24/2018 1039   AST 68 (H) 03/24/2018 0818   ALT 22 03/24/2018 1039   ALT 21 03/24/2018 0818   BILITOT 0.8 03/24/2018 1039   BILITOT 0.7 03/24/2018 0818         Impression and Plan:  73 year old man with:   1.  Renal cell carcinoma with documented metastatic disease to the liver and peritoneum.  He had an excellent response to combined immunotherapy and therapy continues to be withheld.  The natural course of this disease was discussed today as well as alternative treatment options.  His last CT scan in January 2020 was reviewed again and discussed with the patient.  His scan showed continues response to immunotherapy and likely this response will continue to be ongoing.  Based on his recent complications I have  recommended to discontinue immune therapy altogether and use oral targeted therapy such as axitinib as a second line if his next scan shows less than complete response.  It is a very distinct possibility that he might not require treatment immediately given the positive response associated with these agents.  I recommended repeat imaging studies in early part of April and consideration for salvage therapy then.      2.  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  No other immune mediated complications noted at this time.  He has follow-up with endocrinology in the immediate future.  3.  Hyponatremia: Multifactorial in nature related to thyroid disease and poor nutritional intake.  His sodium has been repeated today and has been adequate when he was discharged from the hospital.  4.  Prognosis and goals of care: Therapy remains palliative although complete responses has been documented with this therapy  and aggressive therapy is warranted given his an excellent performance status.  5.  Follow-up: We will be in 6 weeks to repeat CT scan  25  minutes was spent with the patient face-to-face today.  More than 50% of time was dedicated to reviewing the natural course of his disease, treatment options and complications related to his current therapy.   Zola Button, MD 2/21/20201:14 PM    Patient continues to be lethargic and his sodium came back significantly on 113 indicating severe metabolic derangement that requires urgent evaluation.  Differential diagnosis includes SIADH, versus dehydration among CNS process.  Urgent imaging of the brain would be required given these findings.  He will be referred to the emergency department for this to be done urgently.  Zola Button MD 04/08/18

## 2018-04-08 NOTE — Telephone Encounter (Signed)
Gave avs and calenar

## 2018-04-11 ENCOUNTER — Ambulatory Visit: Payer: Medicare HMO | Admitting: Endocrinology

## 2018-04-11 ENCOUNTER — Telehealth: Payer: Self-pay | Admitting: Dietician

## 2018-04-11 NOTE — Telephone Encounter (Signed)
error 

## 2018-04-12 ENCOUNTER — Ambulatory Visit: Payer: Medicare HMO

## 2018-04-12 ENCOUNTER — Other Ambulatory Visit: Payer: Medicare HMO

## 2018-04-19 ENCOUNTER — Other Ambulatory Visit: Payer: Self-pay | Admitting: Internal Medicine

## 2018-04-19 ENCOUNTER — Ambulatory Visit: Payer: Medicare HMO | Admitting: Internal Medicine

## 2018-04-19 ENCOUNTER — Encounter: Payer: Self-pay | Admitting: Internal Medicine

## 2018-04-19 VITALS — BP 120/60 | HR 60 | Ht 69.0 in | Wt 190.0 lb

## 2018-04-19 DIAGNOSIS — E2749 Other adrenocortical insufficiency: Secondary | ICD-10-CM | POA: Insufficient documentation

## 2018-04-19 DIAGNOSIS — E871 Hypo-osmolality and hyponatremia: Secondary | ICD-10-CM | POA: Diagnosis not present

## 2018-04-19 DIAGNOSIS — E039 Hypothyroidism, unspecified: Secondary | ICD-10-CM | POA: Insufficient documentation

## 2018-04-19 LAB — TSH: TSH: 64.97 u[IU]/mL — AB (ref 0.35–4.50)

## 2018-04-19 LAB — T4, FREE: FREE T4: 0.37 ng/dL — AB (ref 0.60–1.60)

## 2018-04-19 MED ORDER — LEVOTHYROXINE SODIUM 100 MCG PO TABS: 100.0000 ug | ORAL_TABLET | Freq: Every day | ORAL | 5 refills | Status: DC

## 2018-04-19 MED ORDER — HYDROCORTISONE 10 MG PO TABS: ORAL_TABLET | ORAL | 1 refills | Status: DC

## 2018-04-19 MED ORDER — HYDROCORTISONE 5 MG PO TABS: ORAL_TABLET | ORAL | 3 refills | Status: DC

## 2018-04-19 NOTE — Progress Notes (Signed)
Patient ID: [name], male   DOB: 1945-09-22, 73 y.o.   MRN: [id number]    HPI  [name] is a 73 y.o.-year-old male, referred from the hospital, for management of recently dx'ed  hypothyroidism and adrenal insufficiency.  He is here with his daughter who offers part of the history, especially related to recent hospitalization, medications, and his symptoms.  Patient was admitted for weakness on 03/13/2018 with mild hyponatremia of 129.  However, he was recently admitted on 03/24/2018 with severe hyponatremia, at 115.  At that time, he was found to be profoundly hypothyroid and also adrenal insufficiency.  He was started on hormonal replacement for both conditions and his sodium was slowly repeated.  His sodium improved to normal before discharge.  He was sent home with levothyroxine and hydrocortisone.  He was advised to see endocrinology in 1 month.  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 He had recurrence in 09/2017 >> metastases in Lung, Liver, and L kidney. He is on ipilimumab Curt Bears) and Nivolumab (Opdivo) - started 09/2017, continued q3 weeks x 4 >> stopped after developing weakness in ~12/2017.  Reviewing his oncologist note, he will continue with p.o. Biologics if needed after he has a new CT scan.  Primary Hypothyroidism: Pt. has been dx with hypothyroidism in 03/2018 >> started on Levothyroxine 50 mcg.  She takes the thyroid hormone: - fasting - with water - separated by 20-30 min from b'fast  - no calcium, iron - + Protonix as needed - + multivitamins at night  I reviewed pt's thyroid tests: Lab Results  Component Value Date   TSH 86.397 (H) 03/24/2018   TSH 66.300 (H) 03/24/2018   TSH 0.312 (L) 01/06/2018   TSH 1.756 09/20/2017   FREET4 <0.25 (L) 03/24/2018   T3FREE 0.5 (L) 03/24/2018   Antithyroid antibodies: No results found for: THGAB No components found for: TPOAB  Pt describes: - weight  gain - Resolved fatigue - No heat or cold intolerance - No depression - No diarrhea or constipation - No hair loss  Pt denies feeling nodules in neck, hoarseness, dysphagia/odynophagia, SOB with lying down.  She has no FH of thyroid disorders. No FH of thyroid cancer.  No h/o radiation tx to head or neck. No recent use of iodine supplements. No Biotin.  Adrenal insufficiency:  During his admission, patient was found to have a low cortisol, of 2.4 (03/24/2018).  A stim test performed the next day was abnormal: Component     Latest Ref Rng & Units 03/24/2018 03/25/2018  Cortisol, Base     ug/dL  2.2  Cortisol, 30 Min     ug/dL  6.9  Cortisol, 60 Min     ug/dL  8.6  Cortisol, Plasma     ug/dL 2.4    He was discharged on hydrocortisone 20 mg in a.m. and 10 mg in p.m. he had significant fatigue and weakness before starting this but he started to feel extremely well after starting treatment.  Hyponatremia:  No previous history of hyponatremia before this year.  Reviewed levels: Lab Results  Component Value Date   NA 137 04/08/2018   NA 135 03/28/2018   NA 135 03/27/2018   NA 129 (L) 03/26/2018   NA 124 (L) 03/25/2018   NA 123 (L) 03/25/2018   NA 120 (L) 03/25/2018   NA 117 (LL) 03/24/2018   NA 116 (LL) 03/24/2018   NA 115 (LL) 03/24/2018  NA 116 (L) 03/24/2018   NA 120 (L) 03/22/2018   NA 136 03/16/2018   NA 134 (L) 03/15/2018   NA 131 (L) 03/14/2018   NA 129 (L) 03/13/2018   NA 134 03/11/2018   NA 136 03/04/2018   NA 135 02/10/2018   NA 141 01/26/2018   NA 139 01/12/2018   NA 137 01/06/2018   NA 140 12/30/2017   NA 141 12/15/2017   NA 141 11/24/2017   NA 141 11/03/2017   NA 140 10/13/2017   NA 139 09/20/2017   NA 139 07/29/2015   NA 136 02/05/2015   He had lung CA in 1993-1994.  He quit smoking in 1990  ROS: Constitutional: + See HPI, + poor sleep Eyes: no blurry vision, no xerophthalmia ENT: no sore throat, no nodules palpated in throat, no  dysphagia/odynophagia, no hoarseness Cardiovascular: no CP/+ SOB/no palpitations/leg swelling Respiratory: + Cough/+ SOB/+ wheezing Gastrointestinal: no N/V/D/C/+ heartburn Musculoskeletal: no muscle/+ joint aches Skin: no rashes Neurological: no tremors/numbness/tingling/dizziness Psychiatric: no depression/anxiety  Past Medical History:  Diagnosis Date  . Arthritis   . Chronic kidney disease    only has one kidney   . 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 02/01/2015  . Coronary artery disease    followed by Dr.Tilley  . GERD (gastroesophageal reflux disease)   . Heart murmur   . High cholesterol   . Hx of cancer of lung 1980's  . Hypertension   . Incisional hernia 08/01/2015  . Lung cancer ([place])   . Recurrent umbilical hernia 4/40/1027  . Right renal mass    Past Surgical History:  Procedure Laterality Date  . APPENDECTOMY  age 18  . CHOLECYSTECTOMY  1980's  . LAPAROSCOPIC LYSIS OF ADHESIONS N/A 08/01/2015   Procedure: LAPAROSCOPIC LYSIS OF ADHESIONS;  Surgeon: Michael Boston, MD;  Location: [facility];  Service: General;  Laterality: N/A;  . LUNG LOBECTOMY  1992   lung cancer- patient has staples in lung not to have MRI per patient   . PILONIDAL CYST EXCISION  2011   Dr Harlow Asa  . ROBOTIC ASSITED PARTIAL NEPHRECTOMY Right 02/01/2015   Procedure: RIGHT ROBOTIC ASSISTED LAPAROSOCOPY NEPHRECTOMY;  Surgeon: Ardis Hughs, MD;  Location: [facility];  Service: Urology;  Laterality: Right;  . VENTRAL HERNIA REPAIR N/A 08/01/2015   Procedure: LAPAROSCOPIC VENTRAL WALL HERNIA WITH MESH;  Surgeon: Michael Boston, MD;  Location: [facility];  Service: General;  Laterality: N/A;   Social History   Socioeconomic History  . Marital status: Married    Spouse name: Not on file  . Number of children: Not on file  . Years of education: Not on file  . Highest education level: Not on file  Occupational History  . Occupation: Retired  Scientific laboratory technician  . Financial resource  strain: Not on file  . Food insecurity:    Worry: Not on file    Inability: Not on file  . Transportation needs:    Medical: Not on file    Non-medical: Not on file  Tobacco Use  . Smoking status: Former Smoker    Packs/day: 1.00    Years: 20.00    Pack years: 20.00    Last attempt to quit: 01/29/1988    Years since quitting: 30.2  . Smokeless tobacco: Never Used  Substance and Sexual Activity  . Alcohol use: Yes    Comment: rare  . Drug use: No  . Sexual activity: Not on file  Lifestyle  . Physical activity:  Days per week: Not on file    Minutes per session: Not on file  . Stress: Not on file  Relationships  . Social connections:    Talks on phone: Not on file    Gets together: Not on file    Attends religious service: Not on file    Active member of club or organization: Not on file    Attends meetings of clubs or organizations: Not on file    Relationship status: Not on file  . Intimate partner violence:    Fear of current or ex partner: Not on file    Emotionally abused: Not on file    Physically abused: Not on file    Forced sexual activity: Not on file  Other Topics Concern  . Not on file  Social History Narrative  . Not on file   Current Outpatient Medications on File Prior to Visit  Medication Sig Dispense Refill  . acetaminophen (TYLENOL) 325 MG tablet Take 2 tablets (650 mg total) by mouth every 6 (six) hours as needed for mild pain or headache (over the counter). 30 tablet 3  . aspirin 81 MG chewable tablet Chew 81 mg by mouth daily.    Marland Kitchen levothyroxine (SYNTHROID, LEVOTHROID) 50 MCG tablet Take 1 tablet (50 mcg total) by mouth daily at 6 (six) AM. 30 tablet 1  . Multiple Vitamin (MULTIVITAMIN WITH MINERALS) TABS tablet Take 1 tablet by mouth daily. 30 tablet 3  . pantoprazole (PROTONIX) 40 MG tablet Take 1 tablet (40 mg total) by mouth daily at 6 (six) AM. (Patient taking differently: Take 40 mg by mouth daily as needed (reflux). ) 30 tablet 3  .  prochlorperazine (COMPAZINE) 10 MG tablet Take 1 tablet (10 mg total) by mouth every 6 (six) hours as needed for nausea or vomiting. 30 tablet 0  . temazepam (RESTORIL) 7.5 MG capsule Take 1 capsule (7.5 mg total) by mouth at bedtime as needed for sleep. (Patient not taking: Reported on 04/19/2018) 30 capsule 0   No current facility-administered medications on file prior to visit.    Allergies  Allergen Reactions  . Benadryl [Diphenhydramine] Other (See Comments)    Severe restless legs   Family History  Problem Relation Age of Onset  . Heart attack Father    Cardiologist: Dr. Sallyanne Kuster.  PE: BP 120/60   Pulse 60   Ht 5\' 9"  (1.753 m)   Wt 190 lb (86.2 kg)   SpO2 95%   BMI 28.06 kg/m  Wt Readings from Last 3 Encounters:  04/08/18 186 lb 12.8 oz (84.7 kg)  03/24/18 180 lb 12.4 oz (82 kg)  03/24/18 189 lb 9.6 oz (86 kg)   Constitutional: overweight, in NAD Eyes: PERRLA, EOMI, no exophthalmos ENT: moist mucous membranes, no thyromegaly, no cervical lymphadenopathy Cardiovascular: RRR, No MRG Respiratory: + wheezing B lower Lungs Gastrointestinal: abdomen soft, NT, ND, BS+ Musculoskeletal: no deformities, strength intact in all 4 Skin: moist, warm, no rashes Neurological: no tremor with outstretched hands, DTR normal in all 4  ASSESSMENT: 1. Primary Hypothyroidism  2. Central Adrenal Insufficiency  3. Hyponatremia  PLAN:  1. Patient with new dx of primary hypothyroidism, on levothyroxine therapy.  - Likely 2/2 ipilimumab - On admission, TSH was 86. He was given LT4 iv, now on LT4 po 50 mcg daily. - he appears euthyroid.  - he does not appear to have a goiter, thyroid nodules, or neck compression symptoms - We discussed about correct intake of levothyroxine, fasting, with water, separated  by at least 30 minutes from breakfast, and separated by more than 4 hours from calcium, iron, multivitamins, acid reflux medications (PPIs). - will check thyroid tests today: TSH, free T4  and then increase the LT4 dose accordingly - If labs today are abnormal, he will need to return in ~6 weeks for repeat labs - Otherwise, I will see him back in 3 months  2. Central Adrenal Insufficiency - likely 2/2 Ipilimumab - currently on Hydrocortisone 20 mg in am and 10 mg in am >> we discussed that we need to decrease the dose now to a more physiologic 10 mg in am and 5 mg in pm >> will first decrease the dose to 15 mg in am and 5 in pm , then will decrease to the mentioned dose- a bid dose is likely best >> in am and best ~3 pm - Due to possible side effects of high steroid doses, we need to decrease the dose to the minimum dose that allows him to feel well - Discussed with patient and her daughter about sick days rules:  If you cannot keep anything down, including your hydrocortisone medication, please go to the emergency room or your primary care physician office to get steroids injected in the muscle or vein.   If you have a fever (more than 100 Fahrenheit) or gastroenteritis with nausea/vomiting and diarrhea, please double the dose of your hydrocortisone for the duration of the fever or the gastroenteritis.  Do not run out of your hydrocortisone medication. - advised for [place] bracelet mentioning "adrenal insufficiency".  3. Hyponatremia - Na on last admission 115 - resolved now - likely 2/2 problem #1 and #2  Needs refills of LT4.  Office Visit on 04/19/2018  Component Date Value Ref Range Status  . TSH 04/19/2018 64.97* 0.35 - 4.50 uIU/mL Final  . Free T4 04/19/2018 0.37* 0.60 - 1.60 ng/dL Final   Comment: Specimens from patients who are undergoing biotin therapy and /or ingesting biotin supplements may contain high levels of biotin.  The higher biotin concentration in these specimens interferes with this Free T4 assay.  Specimens that contain high levels  of biotin may cause false high results for this Free T4 assay.  Please interpret results in light of the total  clinical presentation of the patient.     TSH is still very high, so we will increase the dose of his levothyroxine to 100 mcg daily and have him back for labs in 5 weeks.  Philemon Kingdom, MD PhD [facility]

## 2018-04-19 NOTE — Progress Notes (Signed)
Received a call from April at [facility] store that they are out of HC 5 mg and is on back order.  Prescription of HC 10 mg was sent to be taken as follow    15 mg QAM , 5 mg Q afternoon at 3 pm for 2 weeks, followed by HC 10 mg QAM and 5 mg Q afternoon.     Abby Nena Jordan, MD  [facility]  [facility] [address], [place] [address] Phone: [phone] FAX: [phone]

## 2018-04-19 NOTE — Patient Instructions (Addendum)
Please stop at the lab.  For now, please continue levothyroxine 50 mcg daily.  Take the thyroid hormone every day, with water, at least 30 minutes before breakfast, separated by at least 4 hours from: - acid reflux medications - calcium - iron - multivitamins  Please decrease hydrocortisone to: - 15 mg in am and 5 mg in pm for 2 weeks, then - 10 mg in am and 5 mg in pm  Try to take the second dose of hydrocortisone around 2-3 PM, but no later than 6 PM.  - You absolutely need to take this medication every day and not skip doses. - Please double the dose if you have a fever, for the duration of the fever. - If you cannot take anything by mouth (vomiting) or you have severe diarrhea so that you eliminate the hydrocortisone pills in your stool, please make sure that you get the hydrocortisone in the vein instead - go to the nearest emergency department/urgent care or you may go to your PCPs office  - Please try to get a MedAlert bracelet or pendant indicating: "Adrenal insufficiency".  Please return in 3 months.

## 2018-04-20 ENCOUNTER — Telehealth: Payer: Self-pay | Admitting: Internal Medicine

## 2018-04-20 ENCOUNTER — Telehealth: Payer: Self-pay

## 2018-04-20 NOTE — Telephone Encounter (Signed)
No, it cannot be changed.  Please check with the patient if he can get it from another pharmacy.  Another option is to use 10 mg tablets and to cut them in half.  Per their preference.

## 2018-04-20 NOTE — Telephone Encounter (Signed)
Per [facility], "Caller April from [facility] reports Hydrocortisone 5 mg quantity 360. Take 3 tabs by mouth 8 am and one at 4 pm. Med is on back order and wants to know if it can be changed to something else. Dr. Margreta Journey is the prescriber."

## 2018-04-20 NOTE — Telephone Encounter (Signed)
Looks like this was already taken care of by on call doctor.  Patient notified will need to cut tabs.

## 2018-04-20 NOTE — Telephone Encounter (Signed)
[name], can you please call pt: His TSH is still very high. Let us increase the dose of levothyroxine to 100 mcg daily and have him back in 5 weeks for repeat labs. I will send the prescription to the pharmacy and order the labs.   Notified patient and his sister of message from Dr. Cruzita Lederer, they expressed understanding and agreement. No further questions.

## 2018-04-22 ENCOUNTER — Encounter: Payer: Self-pay | Admitting: Physician Assistant

## 2018-04-22 ENCOUNTER — Ambulatory Visit: Payer: Medicare HMO | Admitting: Physician Assistant

## 2018-04-22 VITALS — BP 150/81 | HR 60 | Ht 69.0 in | Wt 190.6 lb

## 2018-04-22 DIAGNOSIS — I313 Pericardial effusion (noninflammatory): Secondary | ICD-10-CM

## 2018-04-22 DIAGNOSIS — C3490 Malignant neoplasm of unspecified part of unspecified bronchus or lung: Secondary | ICD-10-CM | POA: Diagnosis not present

## 2018-04-22 DIAGNOSIS — I2510 Atherosclerotic heart disease of native coronary artery without angina pectoris: Secondary | ICD-10-CM

## 2018-04-22 DIAGNOSIS — R062 Wheezing: Secondary | ICD-10-CM | POA: Diagnosis not present

## 2018-04-22 DIAGNOSIS — N184 Chronic kidney disease, stage 4 (severe): Secondary | ICD-10-CM

## 2018-04-22 DIAGNOSIS — E785 Hyperlipidemia, unspecified: Secondary | ICD-10-CM | POA: Diagnosis not present

## 2018-04-22 DIAGNOSIS — J209 Acute bronchitis, unspecified: Secondary | ICD-10-CM | POA: Diagnosis not present

## 2018-04-22 DIAGNOSIS — I10 Essential (primary) hypertension: Secondary | ICD-10-CM | POA: Diagnosis not present

## 2018-04-22 DIAGNOSIS — Z85528 Personal history of other malignant neoplasm of kidney: Secondary | ICD-10-CM | POA: Diagnosis not present

## 2018-04-22 DIAGNOSIS — I3139 Other pericardial effusion (noninflammatory): Secondary | ICD-10-CM

## 2018-04-22 NOTE — Patient Instructions (Signed)
Medication Instructions:  Your physician recommends that you continue on your current medications as directed. Please refer to the Current Medication list given to you today.  If you need a refill on your cardiac medications before your next appointment, please call your pharmacy.   Lab work:  NONE  If you have labs (blood work) drawn today and your tests are completely normal, you will receive your results only by: Marland Kitchen MyChart Message (if you have MyChart) OR . A paper copy in the mail If you have any lab test that is abnormal or we need to change your treatment, we will call you to review the results.  Testing/Procedures: Your physician has requested that you have an Limited Echocardiogram. Echocardiography is a painless test that uses sound waves to create images of your heart. It provides your doctor with information about the size and shape of your heart and how well your heart's chambers and valves are working. This procedure takes approximately one hour. There are no restrictions for this procedure.   Follow-Up: . Your physician recommends that you schedule a follow-up appointment in 3 months with DR. Sallyanne Kuster   Any Other Special Instructions Will Be Listed Below (If Applicable). Follow up with your Primary Care Doctor for your wheezing

## 2018-04-22 NOTE — Progress Notes (Signed)
Cardiology Office Note    Date:  04/24/2018   ID:  [name], DOB 28-Feb-1945, MRN [id number]  PCP:  Alex Downing, MD  Cardiologist:  Dr. Sallyanne Perry  Chief Complaint  Patient presents with  . Follow-up    seen for Dr. Sallyanne Perry    History of Present Illness:  [name] is a 73 y.o. male with PMH of CAD, hypertension, hyperlipidemia, frequent PVCs, CKD stage IV, history of right lower lobectomy for lung cancer and right nephrectomy for kidney cancer.  He has a known occluded RCA with left-to-right collaterals, occluded first diagonal, 80% stenosis in ramus intermedius and a 50% stenosis in proximal RCA with minor irregularities in left main and LAD on previous cardiac catheterization in May 2010.  Echocardiogram obtained in March 2013 showed preserved EF.  He was previously followed by Dr. Wynonia Perry however later transitioned care to Dr. Sallyanne Perry following Dr. Thurman Perry retirement.  He is currently undergoing chemotherapy for renal clear cell carcinoma metastasis involving lung and the liver, omentum and the retrocaval lymph nodes.  He was last seen by Dr. Sallyanne Perry on 03/11/2018, he was markedly hypotensive that day.  He did not have any chest discomfort at the time.  Previous CT of chest and abdomen performed in January 2020 showed extensive atherosclerosis in the coronary, aorta, aortic arch, abdominal and iliac vessels.  He had small pericardial effusion at that time.  He also complained of carpal tunnel disease.  His lisinopril-hydrochlorothiazide and amlodipine were stopped.  2 days later, he developed significant weakness and loss of appetite and was admitted to the hospital.  2D echo performed for abnormal EKG with low voltage showed EF 55 to 60%, grade 1 DD, moderate pericardial effusion.  The pleural pericardial effusion was felt to likely related to metastatic cancer.  However it was too small for pericardiocentesis.  Patient was discharged on 1/29, however later came back to the hospital  on 2/6 after his oncologist noted his sodium level was 116.  He has very low plasma cortisol level 2.4.  ACTH stimulation test showed suboptimal response.  TSH was significantly elevated at 84, free T4 was less than 0.25.  He was started on IV Synthroid and subsequently transition to oral therapy.  Etiology of hyponatremia was felt to be multifactorial including profound hypothyroidism, adrenal insufficiency, SIADH and poor oral intake.  MRI of the brain was ordered due to reports of worsening balance issue at home, however this was unable to be completed as he had clips in his chest from previous surgery and he complained of burning sensation in the chest during the MRI.  Patient presents today for cardiology office visit.  Recently, he has been dealing with bronchitis.  According to his daughter, his wife also had the flu recently.  His PCP apparently gave him a course of Tamiflu as well.  He says his breathing continue to be labored.  I recommend him to follow-up with his primary care provider.  He appears to be euvolemic on physical exam.  However he has quite significant wheezing all over the entire lung.  He may ended up needing a bronchodilator.  He is on diltiazem 120 mg daily.  His blood pressure is borderline elevated today, normally his blood pressure is very well controlled on this regimen.  I plan to obtain a limited echocardiogram prior to his next visit with Dr. Sallyanne Perry to follow-up on pericardial effusion.   Past Medical History:  Diagnosis Date  . Arthritis   . Chronic kidney disease  only has one kidney   . 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 02/01/2015  . Coronary artery disease    followed by Dr.Tilley  . GERD (gastroesophageal reflux disease)   . Heart murmur   . High cholesterol   . Hx of cancer of lung 1980's  . Hypertension   . Incisional hernia 08/01/2015  . Lung cancer ([place])   . Recurrent umbilical hernia 7/67/3419  . Right renal mass      Past Surgical History:  Procedure Laterality Date  . APPENDECTOMY  age 33  . CHOLECYSTECTOMY  1980's  . LAPAROSCOPIC LYSIS OF ADHESIONS N/A 08/01/2015   Procedure: LAPAROSCOPIC LYSIS OF ADHESIONS;  Surgeon: Michael Boston, MD;  Location: [facility];  Service: General;  Laterality: N/A;  . LUNG LOBECTOMY  1992   lung cancer- patient has staples in lung not to have MRI per patient   . PILONIDAL CYST EXCISION  2011   Dr Harlow Asa  . ROBOTIC ASSITED PARTIAL NEPHRECTOMY Right 02/01/2015   Procedure: RIGHT ROBOTIC ASSISTED LAPAROSOCOPY NEPHRECTOMY;  Surgeon: Ardis Hughs, MD;  Location: [facility];  Service: Urology;  Laterality: Right;  . VENTRAL HERNIA REPAIR N/A 08/01/2015   Procedure: LAPAROSCOPIC VENTRAL WALL HERNIA WITH MESH;  Surgeon: Michael Boston, MD;  Location: [facility];  Service: General;  Laterality: N/A;    Current Medications: Outpatient Medications Prior to Visit  Medication Sig Dispense Refill  . acetaminophen (TYLENOL) 325 MG tablet Take 2 tablets (650 mg total) by mouth every 6 (six) hours as needed for mild pain or headache (over the counter). 30 tablet 3  . aspirin 81 MG chewable tablet Chew 81 mg by mouth daily.    . hydrocortisone (CORTEF) 10 MG tablet Take 1.5 tablets (15 mg total) by mouth every morning AND 0.5 tablets (5 mg total) every evening. 180 tablet 1  . levothyroxine (SYNTHROID, LEVOTHROID) 100 MCG tablet Take 1 tablet (100 mcg total) by mouth daily before breakfast. 45 tablet 5  . Multiple Vitamin (MULTIVITAMIN WITH MINERALS) TABS tablet Take 1 tablet by mouth daily. 30 tablet 3  . pantoprazole (PROTONIX) 40 MG tablet Take 1 tablet (40 mg total) by mouth daily at 6 (six) AM. (Patient taking differently: Take 40 mg by mouth daily as needed (reflux). ) 30 tablet 3  . prochlorperazine (COMPAZINE) 10 MG tablet Take 1 tablet (10 mg total) by mouth every 6 (six) hours as needed for nausea or vomiting. 30 tablet 0  . temazepam (RESTORIL) 7.5 MG capsule Take 1 capsule (7.5 mg  total) by mouth at bedtime as needed for sleep. 30 capsule 0  . diltiazem (CARDIZEM CD) 120 MG 24 hr capsule      No facility-administered medications prior to visit.      Allergies:   Benadryl [diphenhydramine]   Social History   Socioeconomic History  . Marital status: Married    Spouse name: Not on file  . Number of children: Not on file  . Years of education: Not on file  . Highest education level: Not on file  Occupational History  . Occupation: Retired  Scientific laboratory technician  . Financial resource strain: Not on file  . Food insecurity:    Worry: Not on file    Inability: Not on file  . Transportation needs:    Medical: Not on file    Non-medical: Not on file  Tobacco Use  . Smoking status: Former Smoker    Packs/day: 1.00    Years: 20.00  Pack years: 20.00    Last attempt to quit: 01/29/1988    Years since quitting: 30.2  . Smokeless tobacco: Never Used  Substance and Sexual Activity  . Alcohol use: Yes    Comment: rare  . Drug use: No  . Sexual activity: Not on file  Lifestyle  . Physical activity:    Days per week: Not on file    Minutes per session: Not on file  . Stress: Not on file  Relationships  . Social connections:    Talks on phone: Not on file    Gets together: Not on file    Attends religious service: Not on file    Active member of club or organization: Not on file    Attends meetings of clubs or organizations: Not on file    Relationship status: Not on file  Other Topics Concern  . Not on file  Social History Narrative  . Not on file     Family History:  The patient's family history includes Heart attack in his father.   ROS:   Please see the history of present illness.    ROS All other systems reviewed and are negative.   PHYSICAL EXAM:   VS:  BP (!) 150/81   Pulse 60   Ht 5\' 9"  (1.753 m)   Wt 190 lb 9.6 oz (86.5 kg)   BMI 28.15 kg/m    GEN: Well nourished, well developed, in no acute distress  HEENT: normal  Neck: no JVD,  carotid bruits, or masses Cardiac: RRR; no murmurs, rubs, or gallops,no edema  Respiratory:  clear to auscultation bilaterally, normal work of breathing GI: soft, nontender, nondistended, + BS MS: no deformity or atrophy  Skin: warm and dry, no rash Neuro:  Alert and Oriented x 3, Strength and sensation are intact Psych: euthymic mood, full affect  Wt Readings from Last 3 Encounters:  04/22/18 190 lb 9.6 oz (86.5 kg)  04/19/18 190 lb (86.2 kg)  04/08/18 186 lb 12.8 oz (84.7 kg)      Studies/Labs Reviewed:   EKG:  EKG is not ordered today.    Recent Labs: 03/16/2018: Magnesium 1.9 04/08/2018: ALT 11; BUN 26; Creatinine 1.74; Hemoglobin 11.3; Platelet Count 190; Potassium 4.0; Sodium 137 04/19/2018: TSH 64.97   Lipid Panel No results found for: CHOL, TRIG, HDL, CHOLHDL, VLDL, LDLCALC, LDLDIRECT  Additional studies/ records that were reviewed today include:   Echo 03/14/2018 LV EF: 55% -   60% Study Conclusions  - Left ventricle: The cavity size was normal. Systolic function was   normal. The estimated ejection fraction was in the range of 55%   to 60%. Wall motion was normal; there were no regional wall   motion abnormalities. Doppler parameters are consistent with   abnormal left ventricular relaxation (grade 1 diastolic   dysfunction). - Pericardium, extracardiac: A small to moderate pericardial   effusion was identified circumferential to the heart.    ASSESSMENT:    1. Pericardial effusion   2. Coronary artery disease involving native coronary artery of native heart without angina pectoris   3. Essential hypertension   4. Hyperlipidemia LDL goal <70   5. CKD (chronic kidney disease), stage IV ([place])   6. Malignant neoplasm of lung, unspecified laterality, unspecified part of lung ([place])   7. History of kidney cancer   8. Wheezing      PLAN:  In order of problems listed above:  1. Pericardial effusion: Patient is due for a limited echocardiogram to  check for  the degree of pericardial effusion.  Recent echocardiogram showed moderate effusion.  He appears to be otherwise euvolemic on physical exam.  2. Wheezing: He has diffuse wheezing.  I asked him to be seen by his primary care provider for this.   3. CAD: Denies any recent chest pain.  Continue aspirin  4. Hyperlipidemia: LDL goal less than 70, patient does not seems to be on any statin medication.  I do not have the last lipid profile.  Will need to request lipid panel from primary care provider  5. Hyponatremia: Felt to be multifactorial due to profound hypothyroidism, adrenal insufficiency, SIADH and poor oral intake.  Repeat lab work by primary care provider  6. Hypothyroidism: Continue Synthroid  7. CKD stage IV: Stable on recent lab work  8. History of kidney cancer with metastasis to the lung: Followed by oncology    Medication Adjustments/Labs and Tests Ordered: Current medicines are reviewed at length with the patient today.  Concerns regarding medicines are outlined above.  Medication changes, Labs and Tests ordered today are listed in the Patient Instructions below. Patient Instructions  Medication Instructions:  Your physician recommends that you continue on your current medications as directed. Please refer to the Current Medication list given to you today.  If you need a refill on your cardiac medications before your next appointment, please call your pharmacy.   Lab work:  NONE  If you have labs (blood work) drawn today and your tests are completely normal, you will receive your results only by: Marland Kitchen MyChart Message (if you have MyChart) OR . A paper copy in the mail If you have any lab test that is abnormal or we need to change your treatment, we will call you to review the results.  Testing/Procedures: Your physician has requested that you have an Limited Echocardiogram. Echocardiography is a painless test that uses sound waves to create images of your heart. It  provides your doctor with information about the size and shape of your heart and how well your heart's chambers and valves are working. This procedure takes approximately one hour. There are no restrictions for this procedure.   Follow-Up: . Your physician recommends that you schedule a follow-up appointment in 3 months with DR. Sallyanne Perry   Any Other Special Instructions Will Be Listed Below (If Applicable). Follow up with your Primary Care Doctor for your wheezing       Weston Brass Almyra Deforest, [place]  04/24/2018 10:57 PM    [address] [place], [place], [place]  [address] Phone: [phone]; Fax: [phone]

## 2018-04-24 ENCOUNTER — Encounter: Payer: Self-pay | Admitting: Physician Assistant

## 2018-04-28 NOTE — Progress Notes (Signed)
He is now off a bunch of BP meds that I stopped at his appt in January. But his BP elevation is  Mild and I would hold off more meds until we get that follow up echo [facility]

## 2018-05-03 ENCOUNTER — Other Ambulatory Visit (HOSPITAL_COMMUNITY): Payer: Medicare HMO

## 2018-05-05 ENCOUNTER — Ambulatory Visit (HOSPITAL_COMMUNITY): Payer: Medicare HMO | Attending: Cardiology

## 2018-05-05 ENCOUNTER — Other Ambulatory Visit: Payer: Self-pay

## 2018-05-05 DIAGNOSIS — I3139 Other pericardial effusion (noninflammatory): Secondary | ICD-10-CM

## 2018-05-05 DIAGNOSIS — I313 Pericardial effusion (noninflammatory): Secondary | ICD-10-CM | POA: Diagnosis present

## 2018-05-09 NOTE — Progress Notes (Signed)
The patient has been notified of the result and verbalized understanding.  All questions (if any) were answered. [name], [name] 05/09/2018 3:01 PM

## 2018-05-17 ENCOUNTER — Other Ambulatory Visit: Payer: Self-pay

## 2018-05-17 ENCOUNTER — Telehealth: Payer: Self-pay

## 2018-05-17 ENCOUNTER — Encounter (HOSPITAL_COMMUNITY): Payer: Self-pay

## 2018-05-17 ENCOUNTER — Inpatient Hospital Stay: Payer: Medicare HMO | Attending: Oncology

## 2018-05-17 ENCOUNTER — Ambulatory Visit (HOSPITAL_COMMUNITY)
Admission: RE | Admit: 2018-05-17 | Discharge: 2018-05-17 | Disposition: A | Discharge status: Home / Self Care | Payer: Medicare HMO | Source: Ambulatory Visit | Attending: Oncology | Admitting: Oncology

## 2018-05-17 DIAGNOSIS — C649 Malignant neoplasm of unspecified kidney, except renal pelvis: Secondary | ICD-10-CM

## 2018-05-17 DIAGNOSIS — G4700 Insomnia, unspecified: Secondary | ICD-10-CM | POA: Insufficient documentation

## 2018-05-17 DIAGNOSIS — E871 Hypo-osmolality and hyponatremia: Secondary | ICD-10-CM | POA: Insufficient documentation

## 2018-05-17 DIAGNOSIS — Z87891 Personal history of nicotine dependence: Secondary | ICD-10-CM | POA: Insufficient documentation

## 2018-05-17 DIAGNOSIS — C641 Malignant neoplasm of right kidney, except renal pelvis: Secondary | ICD-10-CM | POA: Diagnosis not present

## 2018-05-17 DIAGNOSIS — E039 Hypothyroidism, unspecified: Secondary | ICD-10-CM | POA: Insufficient documentation

## 2018-05-17 DIAGNOSIS — Z7982 Long term (current) use of aspirin: Secondary | ICD-10-CM | POA: Insufficient documentation

## 2018-05-17 DIAGNOSIS — C786 Secondary malignant neoplasm of retroperitoneum and peritoneum: Secondary | ICD-10-CM | POA: Insufficient documentation

## 2018-05-17 DIAGNOSIS — C7800 Secondary malignant neoplasm of unspecified lung: Secondary | ICD-10-CM | POA: Insufficient documentation

## 2018-05-17 DIAGNOSIS — I10 Essential (primary) hypertension: Secondary | ICD-10-CM | POA: Diagnosis not present

## 2018-05-17 DIAGNOSIS — C787 Secondary malignant neoplasm of liver and intrahepatic bile duct: Secondary | ICD-10-CM | POA: Insufficient documentation

## 2018-05-17 DIAGNOSIS — Z79899 Other long term (current) drug therapy: Secondary | ICD-10-CM | POA: Insufficient documentation

## 2018-05-17 LAB — CBC WITH DIFFERENTIAL (CANCER CENTER ONLY)
Abs Immature Granulocytes: 0.04 10*3/uL (ref 0.00–0.07)
Basophils Absolute: 0 10*3/uL (ref 0.0–0.1)
Basophils Relative: 0 %
Eosinophils Absolute: 0.2 10*3/uL (ref 0.0–0.5)
Eosinophils Relative: 3 %
HCT: 39.4 % (ref 39.0–52.0)
Hemoglobin: 12.7 g/dL — ABNORMAL LOW (ref 13.0–17.0)
Immature Granulocytes: 1 %
LYMPHS ABS: 2 10*3/uL (ref 0.7–4.0)
Lymphocytes Relative: 27 %
MCH: 31.3 pg (ref 26.0–34.0)
MCHC: 32.2 g/dL (ref 30.0–36.0)
MCV: 97 fL (ref 80.0–100.0)
Monocytes Absolute: 0.8 10*3/uL (ref 0.1–1.0)
Monocytes Relative: 11 %
Neutro Abs: 4.4 10*3/uL (ref 1.7–7.7)
Neutrophils Relative %: 58 %
Platelet Count: 165 10*3/uL (ref 150–400)
RBC: 4.06 MIL/uL — ABNORMAL LOW (ref 4.22–5.81)
RDW: 15.4 % (ref 11.5–15.5)
WBC Count: 7.4 10*3/uL (ref 4.0–10.5)
nRBC: 0 % (ref 0.0–0.2)

## 2018-05-17 LAB — CMP (CANCER CENTER ONLY)
ALT: 14 U/L (ref 0–44)
AST: 17 U/L (ref 15–41)
Albumin: 4.1 g/dL (ref 3.5–5.0)
Alkaline Phosphatase: 47 U/L (ref 38–126)
Anion gap: 10 (ref 5–15)
BUN: 26 mg/dL — ABNORMAL HIGH (ref 8–23)
CO2: 30 mmol/L (ref 22–32)
CREATININE: 1.74 mg/dL — AB (ref 0.61–1.24)
Calcium: 9.4 mg/dL (ref 8.9–10.3)
Chloride: 102 mmol/L (ref 98–111)
GFR, Est AFR Am: 43 mL/min — ABNORMAL LOW (ref >60)
GFR, Estimated: 38 mL/min — ABNORMAL LOW (ref >60)
Glucose, Bld: 85 mg/dL (ref 70–99)
Potassium: 4.1 mmol/L (ref 3.5–5.1)
Sodium: 142 mmol/L (ref 135–145)
Total Bilirubin: 0.4 mg/dL (ref 0.3–1.2)
Total Protein: 7.6 g/dL (ref 6.5–8.1)

## 2018-05-17 NOTE — Telephone Encounter (Signed)
Patient daughter [name] concerned that she cannot attend the appointment with the patient on 4/2 because he is hard of hearing and will not retain information. Explained that the visitor restriction has changed so no visitors are permitted for any reason and per Dr. Alen Blew he will call her after the visit to update. [name] appreciative and has no other questions or concerns.

## 2018-05-18 ENCOUNTER — Telehealth: Payer: Self-pay | Admitting: Oncology

## 2018-05-18 NOTE — Telephone Encounter (Signed)
Called patient regarding new 04/02 appointment, spoke with patient's daughter and confirmed with them that this is a visit that will be done by telephone.

## 2018-05-19 ENCOUNTER — Telehealth: Payer: Self-pay | Admitting: Oncology

## 2018-05-19 ENCOUNTER — Inpatient Hospital Stay: Payer: Medicare HMO | Attending: Oncology | Admitting: Oncology

## 2018-05-19 DIAGNOSIS — C641 Malignant neoplasm of right kidney, except renal pelvis: Secondary | ICD-10-CM

## 2018-05-19 NOTE — Progress Notes (Signed)
Virtual Visit via Video Note  I connected with [name] on 05/19/18 at  9:30 AM EDT by a video enabled telemedicine application and verified that I am speaking with the correct person using two identifiers.   I discussed the limitations of evaluation and management by telemedicine and the availability of in person appointments. The patient expressed understanding and agreed to proceed.  History of Present Illness:  73 year old man with advanced renal cell carcinoma status post immunotherapy.  He received a nivolumab and ipilimumab for 4 cycles completed in 2019.  He developed hypothyroidism and adrenal insufficiency related to that and currently on hormone replacement.   Observations/Objective:  He feels well overall although he remains on prednisone 15 mg and has gained 10 pounds.  He denies any shortness of breath or difficulty breathing.  He denies any new complaints.  CT scan obtained on May 17, 2018 was personally reviewed and discussed with the patient and his family via phone today.  The scan showed continues positive response to therapy.  At this time no additional therapy is needed I recommended continued active surveillance.  Assessment and Plan:  Stage IV renal cell carcinoma continues to regress on immunotherapy.  I recommended repeat imaging studies in 3 months.  I recommended continue to follow with endocrinology regarding his prednisone taper and thyroid medication.  Follow Up Instructions:  In 3 months after repeat CT scan.   I discussed the assessment and treatment plan with the patient. The patient was provided an opportunity to ask questions and all were answered. The patient agreed with the plan and demonstrated an understanding of the instructions.   The patient was advised to call back or seek an in-person evaluation if the symptoms worsen or if the condition fails to improve as anticipated.  I provided 20 minutes of non-face-to-face time during this  encounter.   Zola Button, MD

## 2018-05-19 NOTE — Telephone Encounter (Signed)
Scheduled appt per 4/2 sch message.  Central radiology will contact patient about ct appt.  Printed and mailed calendar.

## 2018-06-20 DIAGNOSIS — M79641 Pain in right hand: Secondary | ICD-10-CM | POA: Diagnosis not present

## 2018-06-20 DIAGNOSIS — M79642 Pain in left hand: Secondary | ICD-10-CM | POA: Diagnosis not present

## 2018-06-20 DIAGNOSIS — G5603 Carpal tunnel syndrome, bilateral upper limbs: Secondary | ICD-10-CM | POA: Diagnosis not present

## 2018-07-13 DIAGNOSIS — R1031 Right lower quadrant pain: Secondary | ICD-10-CM | POA: Diagnosis not present

## 2018-07-14 DIAGNOSIS — N433 Hydrocele, unspecified: Secondary | ICD-10-CM | POA: Diagnosis not present

## 2018-07-14 DIAGNOSIS — N503 Cyst of epididymis: Secondary | ICD-10-CM | POA: Diagnosis not present

## 2018-07-20 DIAGNOSIS — Z85528 Personal history of other malignant neoplasm of kidney: Secondary | ICD-10-CM | POA: Diagnosis not present

## 2018-07-20 DIAGNOSIS — K8689 Other specified diseases of pancreas: Secondary | ICD-10-CM | POA: Diagnosis not present

## 2018-07-20 DIAGNOSIS — M799 Soft tissue disorder, unspecified: Secondary | ICD-10-CM | POA: Diagnosis not present

## 2018-07-20 DIAGNOSIS — R160 Hepatomegaly, not elsewhere classified: Secondary | ICD-10-CM | POA: Diagnosis not present

## 2018-07-22 ENCOUNTER — Telehealth: Payer: Self-pay | Admitting: *Deleted

## 2018-07-22 ENCOUNTER — Telehealth: Payer: Self-pay

## 2018-07-22 NOTE — Telephone Encounter (Signed)
MR faxed to [facility]; Release [id number]

## 2018-07-22 NOTE — Telephone Encounter (Signed)
Received call from [place] [facility] [place] requesting a copy of the patient last CT of chest and abd/pelvis. Release of information documented. Spoke with Janett Billow and faxed copy of scans to [phone] for Dr. Arelia Sneddon.

## 2018-08-03 ENCOUNTER — Other Ambulatory Visit (INDEPENDENT_AMBULATORY_CARE_PROVIDER_SITE_OTHER): Payer: Medicare HMO

## 2018-08-03 ENCOUNTER — Other Ambulatory Visit: Payer: Self-pay

## 2018-08-03 DIAGNOSIS — E039 Hypothyroidism, unspecified: Secondary | ICD-10-CM | POA: Diagnosis not present

## 2018-08-03 LAB — TSH: TSH: 34.68 u[IU]/mL — ABNORMAL HIGH (ref 0.35–4.50)

## 2018-08-03 LAB — T4, FREE: Free T4: 0.73 ng/dL (ref 0.60–1.60)

## 2018-08-05 ENCOUNTER — Telehealth: Payer: Self-pay

## 2018-08-05 DIAGNOSIS — E039 Hypothyroidism, unspecified: Secondary | ICD-10-CM

## 2018-08-05 MED ORDER — LEVOTHYROXINE SODIUM 125 MCG PO TABS: 125.0000 ug | ORAL_TABLET | Freq: Every day | ORAL | 3 refills | Status: DC

## 2018-08-05 NOTE — Telephone Encounter (Signed)
Notified patient daughter of message from Dr. Cruzita Lederer, expressed understanding and agreement. No further questions.  Medication sent and labs ordered.

## 2018-08-05 NOTE — Telephone Encounter (Signed)
-----   Message from Philemon Kingdom, MD sent at 08/03/2018  1:08 PM EDT ----- Lenna Sciara, can you please call pt: His TSH is better, but still high.  Let's increase his levothyroxine dose further to 125 mcg daily (can you please send this dose to his pharmacy and take the previous one off his medication list?).  He will need to come back for labs in 5 to 6 weeks.  Can you please order a TSH and a free T4 for then?

## 2018-08-11 ENCOUNTER — Other Ambulatory Visit: Payer: Self-pay

## 2018-08-14 NOTE — Progress Notes (Signed)
Patient ID: [name], male   DOB: June 12, 1945, 73 y.o.   MRN: [id number]    HPI  [name] is a 73 y.o.-year-old male, returning for follow-up for hypothyroidism and adrenal insufficiency.  Last OV 3.5 mo ago.   Reviewed history: Patient was admitted for weakness on 03/13/2018 with mild hyponatremia of 129.  However, he was recently admitted on 03/24/2018 with severe hyponatremia, at 115.  At that time, he was found to be profoundly hypothyroid and also adrenal insufficiency.  He was started on hormonal replacement for both conditions and his sodium was slowly repeated.  His sodium improved to normal before discharge.  He was sent home with levothyroxine and hydrocortisone.  He was advised to see endocrinology in 1 month.  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 He had recurrence in 09/2017 >> metastases in Lung, Liver, and L kidney. He is on ipilimumab Curt Bears) and Nivolumab (Opdivo) - started 09/2017, continued q3 weeks x 4 >> stopped after developing weakness in ~12/2017.  Reviewing his oncologist note, he will continue with p.o. Biologics if needed after he has a new CT scan.  Primary Hypothyroidism: Pt. has been dx with hypothyroidism in 03/2018 >> started on Levothyroxine 50 mcg.  We subsequently increased the dose to 125 mcg daily, last dose change 08/03/2018.  He takes the thyroid hormones: - in am - fasting - at least 30-40 min from b'fast - no Ca, Fe - + Multivitamins at night - he was taking Protonix as needed >> now off - not on Biotin  Reviewed patient's TFTs: Lab Results  Component Value Date   TSH 34.68 (H) 08/03/2018   TSH 64.97 (H) 04/19/2018   TSH 86.397 (H) 03/24/2018   TSH 66.300 (H) 03/24/2018   TSH 0.312 (L) 01/06/2018   TSH 1.756 09/20/2017   FREET4 0.73 08/03/2018   FREET4 0.37 (L) 04/19/2018   FREET4 <0.25 (L) 03/24/2018   T3FREE 0.5 (L) 03/24/2018   Antithyroid antibodies: No results  found for: THGAB No components found for: TPOAB  Pt denies: - feeling nodules in neck - hoarseness - dysphagia - choking - SOB with lying down  She has no FH of thyroid disorders. No FH of thyroid cancer. No h/o radiation tx to head or neck.  No seaweed or kelp. No recent contrast studies. No herbal supplements. No Biotin use. No recent steroids use.   Adrenal insufficiency:  During his admission, patient was found to have a low cortisol, of 2.4 (03/24/2018).  A cosyntropin stimulation test was abnormal the next day: Component     Latest Ref Rng & Units 03/24/2018 03/25/2018  Cortisol, Base     ug/dL  2.2  Cortisol, 30 Min     ug/dL  6.9  Cortisol, 60 Min     ug/dL  8.6  Cortisol, Plasma     ug/dL 2.4    Patient has significant fatigue, weakness, dizziness before starting hydrocortisone.  He was started on 20 mg in a.m. and 10 mg in p.m. in the hospital and he started to feel much better immediately after.  We were able to decrease the dose initially to 50 mg in a.m. and 5 mg in p.m. and then even further to 10 mg in a.m. and 5 mg in p.m  - last dose 5-6 pm.  He continues on this dose now.  He denies - Weight loss, he actually gained ~10 lbs since last OV -  Nausea - Weakness - Dizziness - Headaches  Hyponatremia: -No previous hyponatremia before this year. -Most likely secondary to uncontrolled central adrenal insufficiency and primary hypothyroidism.  Reviewed sodium levels: Lab Results  Component Value Date   NA 142 05/17/2018   NA 137 04/08/2018   NA 135 03/28/2018   NA 135 03/27/2018   NA 129 (L) 03/26/2018   NA 124 (L) 03/25/2018   NA 123 (L) 03/25/2018   NA 120 (L) 03/25/2018   NA 117 (LL) 03/24/2018   NA 116 (LL) 03/24/2018   NA 115 (LL) 03/24/2018   NA 116 (L) 03/24/2018   NA 120 (L) 03/22/2018   NA 136 03/16/2018   NA 134 (L) 03/15/2018   NA 131 (L) 03/14/2018   NA 129 (L) 03/13/2018   NA 134 03/11/2018   NA 136 03/04/2018   NA 135 02/10/2018   NA  141 01/26/2018   NA 139 01/12/2018   NA 137 01/06/2018   NA 140 12/30/2017   NA 141 12/15/2017   NA 141 11/24/2017   NA 141 11/03/2017   NA 140 10/13/2017   NA 139 09/20/2017   NA 139 07/29/2015   He had lung CA in 1993-1994.  He quit smoking in 1990  ROS: Constitutional: + See HPI Eyes: no blurry vision, no xerophthalmia ENT: no sore throat, + see HPI Cardiovascular: no CP/+SOB with exertion only/no palpitations/no leg swelling Respiratory: no cough/+SOB/no wheezing Gastrointestinal: no N/no V/no D/no C/no acid reflux Musculoskeletal: no muscle aches/+ joint aches Skin: no rashes, no hair loss Neurological: no tremors/no numbness/no tingling/no dizziness  I reviewed pt's medications, allergies, PMH, social hx, family hx, and changes were documented in the history of present illness. Otherwise, unchanged from my initial visit note.  Past Medical History:  Diagnosis Date  . Arthritis   . Chronic kidney disease    only has one kidney   . 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 02/01/2015  . Coronary artery disease    followed by Dr.Tilley  . GERD (gastroesophageal reflux disease)   . Heart murmur   . High cholesterol   . Hx of cancer of lung 1980's  . Hypertension   . Incisional hernia 08/01/2015  . Lung cancer ([place])   . Recurrent umbilical hernia 5/40/9811  . Right renal mass    Past Surgical History:  Procedure Laterality Date  . APPENDECTOMY  age 35  . CHOLECYSTECTOMY  1980's  . LAPAROSCOPIC LYSIS OF ADHESIONS N/A 08/01/2015   Procedure: LAPAROSCOPIC LYSIS OF ADHESIONS;  Surgeon: Michael Boston, MD;  Location: [facility];  Service: General;  Laterality: N/A;  . LUNG LOBECTOMY  1992   lung cancer- patient has staples in lung not to have MRI per patient   . PILONIDAL CYST EXCISION  2011   Dr Harlow Asa  . ROBOTIC ASSITED PARTIAL NEPHRECTOMY Right 02/01/2015   Procedure: RIGHT ROBOTIC ASSISTED LAPAROSOCOPY NEPHRECTOMY;  Surgeon: Ardis Hughs,  MD;  Location: [facility];  Service: Urology;  Laterality: Right;  . VENTRAL HERNIA REPAIR N/A 08/01/2015   Procedure: LAPAROSCOPIC VENTRAL WALL HERNIA WITH MESH;  Surgeon: Michael Boston, MD;  Location: [facility];  Service: General;  Laterality: N/A;   Social History   Socioeconomic History  . Marital status: Married    Spouse name: Not on file  . Number of children: Not on file  . Years of education: Not on file  . Highest education level: Not on file  Occupational History  . Occupation: Retired  Science writer  Needs  . Financial resource strain: Not on file  . Food insecurity    Worry: Not on file    Inability: Not on file  . Transportation needs    Medical: Not on file    Non-medical: Not on file  Tobacco Use  . Smoking status: Former Smoker    Packs/day: 1.00    Years: 20.00    Pack years: 20.00    Quit date: 01/29/1988    Years since quitting: 30.5  . Smokeless tobacco: Never Used  Substance and Sexual Activity  . Alcohol use: Yes    Comment: rare  . Drug use: No  . Sexual activity: Not on file  Lifestyle  . Physical activity    Days per week: Not on file    Minutes per session: Not on file  . Stress: Not on file  Relationships  . Social Herbalist on phone: Not on file    Gets together: Not on file    Attends religious service: Not on file    Active member of club or organization: Not on file    Attends meetings of clubs or organizations: Not on file    Relationship status: Not on file  . Intimate partner violence    Fear of current or ex partner: Not on file    Emotionally abused: Not on file    Physically abused: Not on file    Forced sexual activity: Not on file  Other Topics Concern  . Not on file  Social History Narrative  . Not on file   Current Outpatient Medications on File Prior to Visit  Medication Sig Dispense Refill  . acetaminophen (TYLENOL) 325 MG tablet Take 2 tablets (650 mg total) by mouth every 6 (six) hours as needed for mild pain or  headache (over the counter). 30 tablet 3  . aspirin 81 MG chewable tablet Chew 81 mg by mouth daily.    Marland Kitchen diltiazem (CARDIZEM CD) 120 MG 24 hr capsule     . hydrocortisone (CORTEF) 10 MG tablet Take 1.5 tablets (15 mg total) by mouth every morning AND 0.5 tablets (5 mg total) every evening. 180 tablet 1  . levothyroxine (SYNTHROID) 125 MCG tablet Take 1 tablet (125 mcg total) by mouth daily. 90 tablet 3  . Multiple Vitamin (MULTIVITAMIN WITH MINERALS) TABS tablet Take 1 tablet by mouth daily. 30 tablet 3  . pantoprazole (PROTONIX) 40 MG tablet Take 1 tablet (40 mg total) by mouth daily at 6 (six) AM. (Patient taking differently: Take 40 mg by mouth daily as needed (reflux). ) 30 tablet 3  . prochlorperazine (COMPAZINE) 10 MG tablet Take 1 tablet (10 mg total) by mouth every 6 (six) hours as needed for nausea or vomiting. 30 tablet 0  . temazepam (RESTORIL) 7.5 MG capsule Take 1 capsule (7.5 mg total) by mouth at bedtime as needed for sleep. 30 capsule 0   No current facility-administered medications on file prior to visit.    Allergies  Allergen Reactions  . Benadryl [Diphenhydramine] Other (See Comments)    Severe restless legs   Family History  Problem Relation Age of Onset  . Heart attack Father    Cardiologist: Dr. Sallyanne Kuster.  PE: BP 140/70   Pulse 75   Ht 5\' 9"  (1.753 m)   Wt 201 lb (91.2 kg)   SpO2 95%   BMI 29.68 kg/m  Wt Readings from Last 3 Encounters:  08/15/18 201 lb (91.2 kg)  04/22/18 190 lb 9.6  oz (86.5 kg)  04/19/18 190 lb (86.2 kg)   Constitutional: overweight, in NAD Eyes: PERRLA, EOMI, no exophthalmos ENT: moist mucous membranes, no thyromegaly, no cervical lymphadenopathy Cardiovascular: RRR, No MRG Respiratory: CTA B Gastrointestinal: abdomen soft, NT, ND, BS+ Musculoskeletal: no deformities, strength intact in all 4 Skin: moist, warm, no rashes Neurological: no tremor with outstretched hands, DTR normal in all 4  ASSESSMENT: 1. Primary  Hypothyroidism  2. Central Adrenal Insufficiency  3. Hyponatremia  PLAN:  1. Patient with relatively recent diagnosis of primary hypothyroidism, on levothyroxine therapy -Likely secondary to ipilimumab -He was admitted in 03/2018 and a TSH was found to be 86.  He was initially given LT4 iv, then 50 mcg p.o.  We increased the dose afterwards, with the last dose change earlier this month to 125 mcg - latest thyroid labs reviewed with pt >> still high in 07/2018, after which we increased the dose of levothyroxine - he continues on LT4 125 mcg daily - pt feels good on this dose, without any complaints.  He lives by himself.  His wife is in a nursing home, history having daily UTIs. - we discussed about taking the thyroid hormone every day, with water, >30 minutes before breakfast, separated by >4 hours from acid reflux medications, calcium, iron, multivitamins. Pt. is taking it correctly. - will check thyroid tests in ~4 weeks: TSH and fT4 -I will see him back in 6 months  2. Central Adrenal Insufficiency -Likely secondary to ipilimumab -At last visit he was on hydrocortisone 20 mg in a.m. and 10 mg in p.m. and we decreased the dose to more physiologic 10 mg in a.m. and 5 mg in p.m.  He is feeling well on this dose, without increased fatigue, dizziness, weight loss, nausea, abdominal pain -he did not have to double the dose since last OV -no need for parenteral sterids since last OV -We again discussed that we need to keep him on the lowest dose of steroid that allows him to feel well  -Again reiterated sick day rules:  If you cannot keep anything down, including your hydrocortisone medication, please go to the emergency room or your primary care physician office to get steroids injected in the muscle or vein.   If you have a fever (more than 100 Fahrenheit) or gastroenteritis with nausea/vomiting and diarrhea, please double the dose of your hydrocortisone for the duration of the fever or the  gastroenteritis.  Do not run out of your hydrocortisone medication. -I also advised him for a MedAlert bracelet mentioning "adrenal insufficiency".  3. Hyponatremia -He had a sodium of 115 during his last admission from 03/2018  -Likely secondary to uncontrolled hypothyroidism and central adrenal insufficiency, which are now treated -Hyponatremia resolved >> latest Na 142 in 04/2018  Philemon Kingdom, MD PhD [facility] Endocrinology

## 2018-08-14 NOTE — Patient Instructions (Signed)
Please come back for labs in 3 to 4 weeks.  Please continue levothyroxine 125 mcg daily  Take the thyroid hormone every day, with water, at least 30 minutes before breakfast, separated by at least 4 hours from: - acid reflux medications - calcium - iron - multivitamins  Please continue hydrocortisone 10 mg in a.m. and 5 mg in p.m., no later than 6 PM.  - You absolutely need to take this medication every day and not skip doses. - Please double the dose if you have a fever, for the duration of the fever. - If you cannot take anything by mouth (vomiting) or you have severe diarrhea so that you eliminate the hydrocortisone pills in your stool, please make sure that you get the hydrocortisone in the vein instead - go to the nearest emergency department/urgent care or you may go to your PCPs office  - Please try to get a MedAlert bracelet or pendant indicating: "Adrenal insufficiency".  Please return in 6 months.

## 2018-08-15 ENCOUNTER — Other Ambulatory Visit: Payer: Self-pay

## 2018-08-15 ENCOUNTER — Ambulatory Visit (INDEPENDENT_AMBULATORY_CARE_PROVIDER_SITE_OTHER): Payer: Medicare HMO | Admitting: Internal Medicine

## 2018-08-15 ENCOUNTER — Encounter: Payer: Self-pay | Admitting: Internal Medicine

## 2018-08-15 VITALS — BP 140/70 | HR 75 | Ht 69.0 in | Wt 201.0 lb

## 2018-08-15 DIAGNOSIS — E039 Hypothyroidism, unspecified: Secondary | ICD-10-CM

## 2018-08-15 DIAGNOSIS — E871 Hypo-osmolality and hyponatremia: Secondary | ICD-10-CM

## 2018-08-15 DIAGNOSIS — E2749 Other adrenocortical insufficiency: Secondary | ICD-10-CM

## 2018-08-22 ENCOUNTER — Telehealth: Payer: Self-pay | Admitting: *Deleted

## 2018-08-22 NOTE — Telephone Encounter (Signed)
Patient called and instructed to bring a mask to his appointment tomorrow. The daughter will have to come with him to the appointment to help him.      COVID-19 Pre-Screening Questions:  . In the past 7 to 10 days have you had a cough,  shortness of breath, headache, congestion, fever (100 or greater) body aches, chills, sore throat, or sudden loss of taste or sense of smell? No . Have you been around anyone with known Covid 19. No . Have you been around anyone who is awaiting Covid 19 test results in the past 7 to 10 days? No . Have you been around anyone who has been exposed to Covid 19, or has mentioned symptoms of Covid 19 within the past 7 to 10 days? No  If you have any concerns/questions about symptoms patients report during screening (either on the phone or at threshold). Contact the provider seeing the patient or DOD for further guidance.  If neither are available contact a member of the leadership team.

## 2018-08-23 ENCOUNTER — Encounter: Payer: Self-pay | Admitting: Cardiovascular Disease

## 2018-08-23 ENCOUNTER — Other Ambulatory Visit: Payer: Self-pay

## 2018-08-23 ENCOUNTER — Ambulatory Visit: Payer: Medicare HMO | Admitting: Cardiovascular Disease

## 2018-08-23 VITALS — BP 141/79 | HR 64 | Temp 98.2°F | Ht 68.0 in | Wt 204.2 lb

## 2018-08-23 DIAGNOSIS — I700 Atherosclerosis of aorta: Secondary | ICD-10-CM

## 2018-08-23 DIAGNOSIS — E2740 Unspecified adrenocortical insufficiency: Secondary | ICD-10-CM

## 2018-08-23 DIAGNOSIS — I313 Pericardial effusion (noninflammatory): Secondary | ICD-10-CM

## 2018-08-23 DIAGNOSIS — I2510 Atherosclerotic heart disease of native coronary artery without angina pectoris: Secondary | ICD-10-CM | POA: Diagnosis not present

## 2018-08-23 DIAGNOSIS — I3139 Other pericardial effusion (noninflammatory): Secondary | ICD-10-CM

## 2018-08-23 NOTE — Progress Notes (Signed)
Cardiology Office Note:    Date:  08/23/2018   ID:  [name], DOB 03-Jan-1946, MRN [id number]  PCP:  [name], MD  Cardiologist:  Sanda Klein, MD  Electrophysiologist:  None   Referring MD: [name], *   Chief Complaint  Patient presents with  . Pericardial Effusion    History of Present Illness:    [name] is a 73 y.o. male with a hx of coronary artery disease (occlusion RCA with left-to-right collaterals, occluded first diagonal, 80% stenosis ramus intermedius, 50% stenosis proximal RCA, minor irregularities left main and LAD by cardiac catheterization May 2010), normal perfusion and normal systolic function by nuclear stress test April 2018, hypertension, hyperlipidemia, frequent PVCs, small to moderate pericardial effusion, history of right lung lower lobectomy for lung cancer and history of right nephrectomy for kidney cancer, recently diagnosed with adrenal insufficiency and hypothyroidism that led to hospitalization for hypotension in January..  When I last saw him he was profoundly hypotensive.  Since then he was diagnosed with adrenal insufficiency and started on treatment with hydrocortisone.  He feels much better.  He has not had any problems with dizziness, lightheadedness, syncope, edema, chest pain, dyspnea at rest or with activity.  He has residual neuropathic numbness in his fingertips since receiving chemotherapy, in a pattern that suggests carpal tunnel syndrome, but does not have other focal neurological complaints.  He has gained back quite a bit of the weight that he lost during his acute illness and is now in the mildly obese range.  He had a small to moderate pericardial effusion in January and a follow-up study in March showed a "very small pericardial effusion".  Etiology of the effusion was never established. Left ventricular systolic function remains normal with an EF of 55-60% with normal regional wall motion.  He has been  receiving chemotherapy with nivolumab + ipilimumab (apparently with good tumor response) for what is presumed to be renal clear cell carcinoma metastasis involving the lung and liver, omentum, retrocaval lymph nodes.  CT of the chest abdomen and pelvis performed on January 17 shows extensive atherosclerosis in coronaries, aorta, aortic arch and abdominal and iliac vessels.  Multiple metastatic lesions are described with a general reduction in size compared to previous studies.  A small pericardial effusion was seen.  He is due for repeat CT later this month and to follow-up with his oncologist.  Past Medical History:  Diagnosis Date  . Arthritis   . Chronic kidney disease    only has one kidney   . 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 02/01/2015  . Coronary artery disease    followed by Dr.Tilley  . GERD (gastroesophageal reflux disease)   . Heart murmur   . High cholesterol   . Hx of cancer of lung 1980's  . Hypertension   . Incisional hernia 08/01/2015  . Lung cancer ([place])   . Recurrent umbilical hernia 0/34/7425  . Right renal mass     Past Surgical History:  Procedure Laterality Date  . APPENDECTOMY  age 59  . CHOLECYSTECTOMY  1980's  . LAPAROSCOPIC LYSIS OF ADHESIONS N/A 08/01/2015   Procedure: LAPAROSCOPIC LYSIS OF ADHESIONS;  Surgeon: Michael Boston, MD;  Location: [facility];  Service: General;  Laterality: N/A;  . LUNG LOBECTOMY  1992   lung cancer- patient has staples in lung not to have MRI per patient   . PILONIDAL CYST EXCISION  2011   Dr Harlow Asa  . ROBOTIC ASSITED PARTIAL  NEPHRECTOMY Right 02/01/2015   Procedure: RIGHT ROBOTIC ASSISTED LAPAROSOCOPY NEPHRECTOMY;  Surgeon: Ardis Hughs, MD;  Location: [facility];  Service: Urology;  Laterality: Right;  . VENTRAL HERNIA REPAIR N/A 08/01/2015   Procedure: LAPAROSCOPIC VENTRAL WALL HERNIA WITH MESH;  Surgeon: Michael Boston, MD;  Location: [facility];  Service: General;  Laterality: N/A;    Current  Medications: Current Meds  Medication Sig  . acetaminophen (TYLENOL) 325 MG tablet Take 2 tablets (650 mg total) by mouth every 6 (six) hours as needed for mild pain or headache (over the counter).  Marland Kitchen aspirin 81 MG chewable tablet Chew 81 mg by mouth daily.  Marland Kitchen diltiazem (CARDIZEM CD) 120 MG 24 hr capsule   . hydrocortisone (CORTEF) 10 MG tablet Take 1.5 tablets (15 mg total) by mouth every morning AND 0.5 tablets (5 mg total) every evening.  Marland Kitchen levothyroxine (SYNTHROID) 125 MCG tablet Take 1 tablet (125 mcg total) by mouth daily.  . Multiple Vitamin (MULTIVITAMIN WITH MINERALS) TABS tablet Take 1 tablet by mouth daily.  . pantoprazole (PROTONIX) 40 MG tablet Take 1 tablet (40 mg total) by mouth daily at 6 (six) AM. (Patient taking differently: Take 40 mg by mouth daily as needed (reflux). )  . temazepam (RESTORIL) 7.5 MG capsule Take 1 capsule (7.5 mg total) by mouth at bedtime as needed for sleep.  . [DISCONTINUED] prochlorperazine (COMPAZINE) 10 MG tablet Take 1 tablet (10 mg total) by mouth every 6 (six) hours as needed for nausea or vomiting.     Allergies:   Benadryl [diphenhydramine]   Social History   Socioeconomic History  . Marital status: Married    Spouse name: Not on file  . Number of children: Not on file  . Years of education: Not on file  . Highest education level: Not on file  Occupational History  . Occupation: Retired  Scientific laboratory technician  . Financial resource strain: Not on file  . Food insecurity    Worry: Not on file    Inability: Not on file  . Transportation needs    Medical: Not on file    Non-medical: Not on file  Tobacco Use  . Smoking status: Former Smoker    Packs/day: 1.00    Years: 20.00    Pack years: 20.00    Quit date: 01/29/1988    Years since quitting: 30.5  . Smokeless tobacco: Never Used  Substance and Sexual Activity  . Alcohol use: Yes    Comment: rare  . Drug use: No  . Sexual activity: Not on file  Lifestyle  . Physical activity     Days per week: Not on file    Minutes per session: Not on file  . Stress: Not on file  Relationships  . Social Herbalist on phone: Not on file    Gets together: Not on file    Attends religious service: Not on file    Active member of club or organization: Not on file    Attends meetings of clubs or organizations: Not on file    Relationship status: Not on file  Other Topics Concern  . Not on file  Social History Narrative  . Not on file     Family History: The patient's 65 of 5 brothers died of cancer and 1 of liver disease.  His father had coronary artery disease and his mother had Parkinson's disease ROS:   Please see the history of present illness.    All other systems are  reviewed and are negative  EKGs/Labs/Other Studies Reviewed:    The following studies were reviewed today:  Echocardiograms January and March 2020  EKG:  EKG is ordered today.  Shows normal sinus rhythm and is a normal tracing.  Recent Labs: 03/16/2018: Magnesium 1.9 05/17/2018: ALT 14; BUN 26; Creatinine 1.74; Hemoglobin 12.7; Platelet Count 165; Potassium 4.1; Sodium 142 08/03/2018: TSH 34.68  Recent Lipid Panel 12/04/16 CHOL TOTL 223 mg/dl, LDL N/A NM, HDL 19 mg/dl, TRIGLYCER 406 mg/dl and CHOL/HDL 11.9 (Calc)  Physical Exam:    VS:  BP (!) 141/79   Pulse 64   Temp 98.2 F (36.8 C)   Ht 5\' 8"  (1.727 m)   Wt 204 lb 3.2 oz (92.6 kg)   SpO2 94%   BMI 31.05 kg/m     Wt Readings from Last 3 Encounters:  08/23/18 204 lb 3.2 oz (92.6 kg)  08/15/18 201 lb (91.2 kg)  04/22/18 190 lb 9.6 oz (86.5 kg)     GEN:  Well nourished, well developed in no acute distress HEENT: Normal NECK: No JVD and no hepatojugular reflux; No carotid bruits LYMPHATICS: No lymphadenopathy CARDIAC: RRR, no murmurs, rubs, gallops RESPIRATORY:  Clear to auscultation without rales, wheezing or rhonchi  ABDOMEN: Soft, non-tender, non-distended MUSCULOSKELETAL:  No edema; No deformity  SKIN: Warm and dry  NEUROLOGIC:  Alert and oriented x 3 PSYCHIATRIC:  Normal affect   ASSESSMENT:    1. Pericardial effusion   2. Coronary artery disease involving native coronary artery of native heart without angina pectoris   3. Adrenal insufficiency ([place])   4. Atherosclerosis of aorta ([facility])    PLAN:    In order of problems listed above:  1. Pericardial effusion: No clinical evidence of tamponade.  The cause of the effusion remains uncertain and he has metastatic cancer.  We will repeat a limited echo. 2. Adrenal insufficiency: Seeing endocrinology, Dr. Cruzita Lederer.  Reminded him about the importance of stress doses of corticosteroids during critical illness. 3. CAD: He does not have angina.  He is on aspirin.  He is reportedly statin intolerant.  Considering limited prognosis with his widespread metastatic cancer, aggressive lipid-lowering therapy is likely to have a lower benefit.  Dr. Thurman Coyer note from 2018 reports statin intolerance and the fact that he does not want to try other medications for cholesterol lowering.  4. Aortic atherosclerosis: Mild ectasia of the ascending aorta at 3.9 cm.  No intervention or periodic evaluation is planned at this point.   Medication Adjustments/Labs and Tests Ordered: Current medicines are reviewed at length with the patient today.  Concerns regarding medicines are outlined above.  Orders Placed This Encounter  Procedures  . EKG 12-Lead  . ECHOCARDIOGRAM LIMITED   No orders of the defined types were placed in this encounter.   Patient Instructions  Medication Instructions:  No changes If you need a refill on your cardiac medications before your next appointment, please call your pharmacy.   Lab work: None ordered  Testing/Procedures: Your physician has requested that you have an echocardiogram. Echocardiography is a painless test that uses sound waves to create images of your heart. It provides your doctor with information about the size and shape of your  heart and how well your heart's chambers and valves are working. You may receive an ultrasound enhancing agent through an IV if needed to better visualize your heart during the echo.This procedure takes approximately one hour. There are no restrictions for this procedure. This will take place at the [address], [address].    Follow-Up: At [facility] you and your health needs are our priority.  As part of our continuing mission to provide you with exceptional heart care, we have created designated Provider Care Teams.  These Care Teams include your primary Cardiologist (physician) and Advanced Practice Providers (APPs -  Physician Assistants and Nurse Practitioners) who all work together to provide you with the care you need, when you need it. You will need a follow up appointment in 12 months.  Please call our office 2 months in advance to schedule this appointment.  You may see Sanda Klein, MD or one of the following Advanced Practice Providers on your designated Care Team: [place], [place] . Fabian Sharp, PA-C      Signed, Sanda Klein, MD  08/23/2018 1:16 PM    [facility]

## 2018-08-23 NOTE — Patient Instructions (Signed)
Medication Instructions:  No changes If you need a refill on your cardiac medications before your next appointment, please call your pharmacy.   Lab work: None ordered  Testing/Procedures: Your physician has requested that you have an echocardiogram. Echocardiography is a painless test that uses sound waves to create images of your heart. It provides your doctor with information about the size and shape of your heart and how well your heart's chambers and valves are working. You may receive an ultrasound enhancing agent through an IV if needed to better visualize your heart during the echo.This procedure takes approximately one hour. There are no restrictions for this procedure. This will take place at the [address], [address].    Follow-Up: At [facility] you and your health needs are our priority.  As part of our continuing mission to provide you with exceptional heart care, we have created designated Provider Care Teams.  These Care Teams include your primary Cardiologist (physician) and Advanced Practice Providers (APPs -  Physician Assistants and Nurse Practitioners) who all work together to provide you with the care you need, when you need it. You will need a follow up appointment in 12 months.  Please call our office 2 months in advance to schedule this appointment.  You may see Sanda Klein, MD or one of the following Advanced Practice Providers on your designated Care Team: [place], [place] . Fabian Sharp, PA-C

## 2018-08-29 ENCOUNTER — Telehealth: Payer: Self-pay

## 2018-08-29 NOTE — Telephone Encounter (Signed)
Contacted patient and spoke with daughter [name] and made aware that the patient will only have CT chest tomorrow. Explained that since the CT abd/pelvis has already been performed in June by another MD insurance will not authorize another scan so soon after. Central scheduling has been contacted and CT abd/pelvis has been cancelled. MD also aware.

## 2018-08-30 ENCOUNTER — Inpatient Hospital Stay: Payer: Medicare HMO | Attending: Oncology

## 2018-08-30 ENCOUNTER — Other Ambulatory Visit: Payer: Self-pay

## 2018-08-30 ENCOUNTER — Other Ambulatory Visit: Payer: Medicare HMO

## 2018-08-30 ENCOUNTER — Encounter (HOSPITAL_COMMUNITY): Payer: Self-pay

## 2018-08-30 ENCOUNTER — Ambulatory Visit (HOSPITAL_COMMUNITY)
Admission: RE | Admit: 2018-08-30 | Discharge: 2018-08-30 | Disposition: A | Discharge status: Home / Self Care | Payer: Medicare HMO | Source: Ambulatory Visit | Attending: Oncology | Admitting: Oncology

## 2018-08-30 DIAGNOSIS — C7800 Secondary malignant neoplasm of unspecified lung: Secondary | ICD-10-CM | POA: Insufficient documentation

## 2018-08-30 DIAGNOSIS — Z9221 Personal history of antineoplastic chemotherapy: Secondary | ICD-10-CM | POA: Diagnosis not present

## 2018-08-30 DIAGNOSIS — Z85118 Personal history of other malignant neoplasm of bronchus and lung: Secondary | ICD-10-CM | POA: Diagnosis not present

## 2018-08-30 DIAGNOSIS — C641 Malignant neoplasm of right kidney, except renal pelvis: Secondary | ICD-10-CM | POA: Insufficient documentation

## 2018-08-30 DIAGNOSIS — I700 Atherosclerosis of aorta: Secondary | ICD-10-CM | POA: Diagnosis not present

## 2018-08-30 DIAGNOSIS — C786 Secondary malignant neoplasm of retroperitoneum and peritoneum: Secondary | ICD-10-CM | POA: Insufficient documentation

## 2018-08-30 DIAGNOSIS — Z905 Acquired absence of kidney: Secondary | ICD-10-CM | POA: Insufficient documentation

## 2018-08-30 LAB — CBC WITH DIFFERENTIAL (CANCER CENTER ONLY)
Abs Immature Granulocytes: 0.06 10*3/uL (ref 0.00–0.07)
Basophils Absolute: 0 10*3/uL (ref 0.0–0.1)
Basophils Relative: 0 %
Eosinophils Absolute: 0.3 10*3/uL (ref 0.0–0.5)
Eosinophils Relative: 4 %
HCT: 36.9 % — ABNORMAL LOW (ref 39.0–52.0)
Hemoglobin: 12.7 g/dL — ABNORMAL LOW (ref 13.0–17.0)
Immature Granulocytes: 1 %
Lymphocytes Relative: 33 %
Lymphs Abs: 2.2 10*3/uL (ref 0.7–4.0)
MCH: 30.8 pg (ref 26.0–34.0)
MCHC: 34.4 g/dL (ref 30.0–36.0)
MCV: 89.3 fL (ref 80.0–100.0)
Monocytes Absolute: 0.7 10*3/uL (ref 0.1–1.0)
Monocytes Relative: 11 %
Neutro Abs: 3.5 10*3/uL (ref 1.7–7.7)
Neutrophils Relative %: 51 %
Platelet Count: 181 10*3/uL (ref 150–400)
RBC: 4.13 MIL/uL — ABNORMAL LOW (ref 4.22–5.81)
RDW: 12.5 % (ref 11.5–15.5)
WBC Count: 6.8 10*3/uL (ref 4.0–10.5)
nRBC: 0 % (ref 0.0–0.2)

## 2018-08-30 LAB — CMP (CANCER CENTER ONLY)
ALT: 17 U/L (ref 0–44)
AST: 18 U/L (ref 15–41)
Albumin: 4.1 g/dL (ref 3.5–5.0)
Alkaline Phosphatase: 39 U/L (ref 38–126)
Anion gap: 9 (ref 5–15)
BUN: 17 mg/dL (ref 8–23)
CO2: 29 mmol/L (ref 22–32)
Calcium: 9.3 mg/dL (ref 8.9–10.3)
Chloride: 102 mmol/L (ref 98–111)
Creatinine: 1.73 mg/dL — ABNORMAL HIGH (ref 0.61–1.24)
GFR, Est AFR Am: 44 mL/min — ABNORMAL LOW (ref >60)
GFR, Estimated: 38 mL/min — ABNORMAL LOW (ref >60)
Glucose, Bld: 101 mg/dL — ABNORMAL HIGH (ref 70–99)
Potassium: 4.2 mmol/L (ref 3.5–5.1)
Sodium: 140 mmol/L (ref 135–145)
Total Bilirubin: 0.4 mg/dL (ref 0.3–1.2)
Total Protein: 7.1 g/dL (ref 6.5–8.1)

## 2018-08-30 MED ORDER — SODIUM CHLORIDE (PF) 0.9 % IJ SOLN
INTRAMUSCULAR | Status: AC
  Filled 2018-08-30: qty 50

## 2018-08-30 MED ORDER — IOHEXOL 300 MG/ML  SOLN
60.0000 mL | Freq: Once | INTRAMUSCULAR | Status: AC | PRN
  Administered 2018-08-30: 13:00:00 60 mL via INTRAVENOUS

## 2018-09-01 ENCOUNTER — Other Ambulatory Visit: Payer: Self-pay

## 2018-09-01 ENCOUNTER — Inpatient Hospital Stay: Payer: Medicare HMO | Admitting: Oncology

## 2018-09-01 VITALS — BP 140/70 | HR 71 | Temp 99.1°F | Resp 17 | Ht 68.0 in | Wt 203.0 lb

## 2018-09-01 DIAGNOSIS — Z905 Acquired absence of kidney: Secondary | ICD-10-CM | POA: Diagnosis not present

## 2018-09-01 DIAGNOSIS — Z9221 Personal history of antineoplastic chemotherapy: Secondary | ICD-10-CM | POA: Diagnosis not present

## 2018-09-01 DIAGNOSIS — C786 Secondary malignant neoplasm of retroperitoneum and peritoneum: Secondary | ICD-10-CM

## 2018-09-01 DIAGNOSIS — C641 Malignant neoplasm of right kidney, except renal pelvis: Secondary | ICD-10-CM | POA: Diagnosis not present

## 2018-09-01 DIAGNOSIS — C7800 Secondary malignant neoplasm of unspecified lung: Secondary | ICD-10-CM | POA: Diagnosis not present

## 2018-09-01 NOTE — Progress Notes (Signed)
Hematology and Oncology Follow Up Visit  Lynda Capistran [id number] 1945/07/14 73 y.o. 09/01/2018 9:30 AM [name], MDElkins, [name], *   Principle Diagnosis: 73 year old man with stage IV renal cell carcinoma with hepatic and pulmonary metastasis diagnosed in May 2019.  e.   Prior Therapy: He underwent a radical nephrectomy completed by Dr. Louis Meckel on February 01, 2015.  The final pathology revealed a 3.3 cm clear cell renal cell carcinoma with Fuhrman grade 3.  Margins were negative indicating stage T1 a disease  Nivolumab at 3 mg/mg and ipilimumab at 1 mg/kg cycle 1 started on 09/21/2017.  He completed 4 cycles of therapy.     Current therapy: Active surveillance.  Interim History: Mr. Senner returns today for a repeat evaluation.  Since the last visit, he reports no changes in his health.  He has fully recovered from previous immune therapy complications and has regained all activities of daily living.  He is eating well without any recent hospitalization or illnesses.  He denies excessive fatigue, tiredness or weakness.  He denies any nausea or abdominal distention.  He denies any headaches or neurological deficits.   He denied any alteration mental status, neuropathy, confusion or dizziness.  Denies any headaches or lethargy.  Denies any night sweats, weight loss or changes in appetite.  Denied orthopnea, dyspnea on exertion or chest discomfort.  Denies shortness of breath, difficulty breathing hemoptysis or cough.  Denies any abdominal distention, nausea, early satiety or dyspepsia.  Denies any hematuria, frequency, dysuria or nocturia.  Denies any skin irritation, dryness or rash.  Denies any ecchymosis or petechiae.  Denies any lymphadenopathy or clotting.  Denies any heat or cold intolerance.  Denies any anxiety or depression.  Remaining review of system is negative.      .      Medications: Unchanged by my review. Current Outpatient Medications  Medication Sig  Dispense Refill  . acetaminophen (TYLENOL) 325 MG tablet Take 2 tablets (650 mg total) by mouth every 6 (six) hours as needed for mild pain or headache (over the counter). 30 tablet 3  . aspirin 81 MG chewable tablet Chew 81 mg by mouth daily.    Marland Kitchen diltiazem (CARDIZEM CD) 120 MG 24 hr capsule     . hydrocortisone (CORTEF) 10 MG tablet Take 1.5 tablets (15 mg total) by mouth every morning AND 0.5 tablets (5 mg total) every evening. 180 tablet 1  . levothyroxine (SYNTHROID) 125 MCG tablet Take 1 tablet (125 mcg total) by mouth daily. 90 tablet 3  . Multiple Vitamin (MULTIVITAMIN WITH MINERALS) TABS tablet Take 1 tablet by mouth daily. 30 tablet 3  . pantoprazole (PROTONIX) 40 MG tablet Take 1 tablet (40 mg total) by mouth daily at 6 (six) AM. (Patient taking differently: Take 40 mg by mouth daily as needed (reflux). ) 30 tablet 3  . temazepam (RESTORIL) 7.5 MG capsule Take 1 capsule (7.5 mg total) by mouth at bedtime as needed for sleep. 30 capsule 0   No current facility-administered medications for this visit.      Allergies:  Allergies  Allergen Reactions  . Benadryl [Diphenhydramine] Other (See Comments)    Severe restless legs    Past Medical History, Surgical history, Social history, and Family History no changes noted by review.    Physical Exam:  Blood pressure 140/70, pulse 71, temperature 99.1 F (37.3 C), temperature source Oral, resp. rate 17, height 5\' 8"  (1.727 m), weight 203 lb (92.1 kg), SpO2 95 %.  ECOG: 1    General appearance: Alert, awake without any distress. Head: Atraumatic without abnormalities Oropharynx: Without any thrush or ulcers. Eyes: No scleral icterus. Lymph nodes: No lymphadenopathy noted in the cervical, supraclavicular, or axillary nodes Heart:regular rate and rhythm, without any murmurs or gallops.   Lung: Clear to auscultation without any rhonchi, wheezes or dullness to percussion. Abdomin: Soft, nontender without any shifting  dullness or ascites. Musculoskeletal: No clubbing or cyanosis. Neurological: No motor or sensory deficits. Skin: No rashes or lesions.         Lab Results: Lab Results  Component Value Date   WBC 6.8 08/30/2018   HGB 12.7 (L) 08/30/2018   HCT 36.9 (L) 08/30/2018   MCV 89.3 08/30/2018   PLT 181 08/30/2018     Chemistry      Component Value Date/Time   NA 140 08/30/2018 1119   NA 134 03/11/2018 0913   K 4.2 08/30/2018 1119   CL 102 08/30/2018 1119   CO2 29 08/30/2018 1119   BUN 17 08/30/2018 1119   BUN 17 03/11/2018 0913   CREATININE 1.73 (H) 08/30/2018 1119      Component Value Date/Time   CALCIUM 9.3 08/30/2018 1119   ALKPHOS 39 08/30/2018 1119   AST 18 08/30/2018 1119   ALT 17 08/30/2018 1119   BILITOT 0.4 08/30/2018 1119      IMPRESSION: 1. New 8 mm subpleural medial left lower lobe solid pulmonary nodule, cannot exclude a new pulmonary metastasis. Suggest attention on follow-up chest CT in 3 months. 2. Previously visualized 6 mm left lower lobe solid pulmonary nodule is stable. 3. No adenopathy or other potential findings of metastatic disease in the chest.    Impression and Plan:  73 year old man with:   1.  Stage IV renal cell carcinoma with a noted disease to the peritoneum as well as hepatic metastasis documented in 2019.   He is status post immunotherapy outlined above with very little residual disease noted.  Imaging studies obtained in June 2020 of the abdomen and pelvis were personally reviewed and discussed with the patient.  He also had a CT scan of the chest which I also reviewed and it was completed on 08/30/2018.  The scans do not show any convincing evidence of progression of disease and I do not see any need for restarting salvage anticancer therapy.  Alternative options to treat his cancer would be oral targeted therapy in the form of axitinib which will be reserved he develops progression of disease objectively.  For the time being I  recommend continued active surveillance and repeat imaging studies in 4 months.      2.  Immune mediated complications: He has experienced panhypopituitarism and currently on replacement thyroid and Cortef.  No issues reported at this time with overall improvement in his symptoms.    3.  Prognosis and goals of care: Therapy remains palliative although he had an excellent response to immunotherapy and performance status is excellent and aggressive measures are warranted.  4.  Follow-up: In 4 months for repeat evaluation.  25  minutes was spent with the patient face-to-face today.  More than 50% of time was spent on reviewing his disease status including imaging studies, treatment options and long-term prognosis associated with this disease.   Zola Button, MD 7/16/20209:30 AM

## 2018-09-05 ENCOUNTER — Telehealth: Payer: Self-pay | Admitting: Oncology

## 2018-09-05 NOTE — Telephone Encounter (Signed)
Scheduled per los. Mailed printout  °

## 2018-09-06 ENCOUNTER — Other Ambulatory Visit: Payer: Self-pay

## 2018-09-06 ENCOUNTER — Ambulatory Visit (HOSPITAL_COMMUNITY): Payer: Medicare HMO | Attending: Cardiology

## 2018-09-06 DIAGNOSIS — I313 Pericardial effusion (noninflammatory): Secondary | ICD-10-CM | POA: Insufficient documentation

## 2018-09-14 ENCOUNTER — Other Ambulatory Visit (INDEPENDENT_AMBULATORY_CARE_PROVIDER_SITE_OTHER): Payer: Medicare HMO

## 2018-09-14 ENCOUNTER — Other Ambulatory Visit: Payer: Self-pay

## 2018-09-14 DIAGNOSIS — E039 Hypothyroidism, unspecified: Secondary | ICD-10-CM | POA: Diagnosis not present

## 2018-09-14 LAB — TSH: TSH: 12.01 u[IU]/mL — ABNORMAL HIGH (ref 0.35–4.50)

## 2018-09-14 LAB — T4, FREE: Free T4: 0.92 ng/dL (ref 0.60–1.60)

## 2018-09-19 ENCOUNTER — Other Ambulatory Visit: Payer: Self-pay

## 2018-09-19 DIAGNOSIS — E039 Hypothyroidism, unspecified: Secondary | ICD-10-CM

## 2018-09-19 MED ORDER — LEVOTHYROXINE SODIUM 137 MCG PO TABS: 137.0000 ug | ORAL_TABLET | Freq: Every day | ORAL | 2 refills | Status: DC

## 2018-10-03 DIAGNOSIS — R69 Illness, unspecified: Secondary | ICD-10-CM | POA: Diagnosis not present

## 2018-10-04 DIAGNOSIS — R69 Illness, unspecified: Secondary | ICD-10-CM | POA: Diagnosis not present

## 2018-10-19 DIAGNOSIS — R5381 Other malaise: Secondary | ICD-10-CM | POA: Diagnosis not present

## 2018-10-19 DIAGNOSIS — D649 Anemia, unspecified: Secondary | ICD-10-CM | POA: Diagnosis not present

## 2018-10-31 ENCOUNTER — Other Ambulatory Visit: Payer: Self-pay

## 2018-10-31 ENCOUNTER — Other Ambulatory Visit (INDEPENDENT_AMBULATORY_CARE_PROVIDER_SITE_OTHER): Payer: Medicare HMO

## 2018-10-31 DIAGNOSIS — E039 Hypothyroidism, unspecified: Secondary | ICD-10-CM

## 2018-10-31 LAB — T4, FREE: Free T4: 1 ng/dL (ref 0.60–1.60)

## 2018-10-31 LAB — TSH: TSH: 5.47 u[IU]/mL — ABNORMAL HIGH (ref 0.35–4.50)

## 2018-11-01 ENCOUNTER — Other Ambulatory Visit: Payer: Self-pay

## 2018-11-01 DIAGNOSIS — E039 Hypothyroidism, unspecified: Secondary | ICD-10-CM

## 2018-11-01 MED ORDER — LEVOTHYROXINE SODIUM 150 MCG PO TABS: 150.0000 ug | ORAL_TABLET | Freq: Every day | ORAL | 3 refills | Status: DC

## 2018-11-03 ENCOUNTER — Encounter: Payer: Self-pay | Admitting: Internal Medicine

## 2018-11-03 NOTE — Progress Notes (Signed)
Received labs from PCP, on 10/19/2018: CBC with low hemoglobin, at 12.5 CMP with high glucose at 178, BUN/creatinine 18/1.78, GFR 37, otherwise normal TSH 2.64, free T4 1.35

## 2018-12-05 DIAGNOSIS — H52223 Regular astigmatism, bilateral: Secondary | ICD-10-CM | POA: Diagnosis not present

## 2018-12-05 DIAGNOSIS — H26493 Other secondary cataract, bilateral: Secondary | ICD-10-CM | POA: Diagnosis not present

## 2018-12-05 DIAGNOSIS — H5203 Hypermetropia, bilateral: Secondary | ICD-10-CM | POA: Diagnosis not present

## 2018-12-06 ENCOUNTER — Other Ambulatory Visit: Payer: Self-pay

## 2018-12-06 ENCOUNTER — Other Ambulatory Visit (INDEPENDENT_AMBULATORY_CARE_PROVIDER_SITE_OTHER): Payer: Medicare HMO

## 2018-12-06 DIAGNOSIS — E039 Hypothyroidism, unspecified: Secondary | ICD-10-CM

## 2018-12-06 LAB — TSH: TSH: 4.24 u[IU]/mL (ref 0.35–4.50)

## 2018-12-06 LAB — T4, FREE: Free T4: 1.01 ng/dL (ref 0.60–1.60)

## 2018-12-30 ENCOUNTER — Inpatient Hospital Stay: Payer: Medicare HMO | Attending: Oncology

## 2018-12-30 ENCOUNTER — Other Ambulatory Visit: Payer: Self-pay

## 2018-12-30 DIAGNOSIS — Z9221 Personal history of antineoplastic chemotherapy: Secondary | ICD-10-CM | POA: Diagnosis not present

## 2018-12-30 DIAGNOSIS — Z85528 Personal history of other malignant neoplasm of kidney: Secondary | ICD-10-CM | POA: Diagnosis present

## 2018-12-30 DIAGNOSIS — Z905 Acquired absence of kidney: Secondary | ICD-10-CM | POA: Insufficient documentation

## 2018-12-30 DIAGNOSIS — C641 Malignant neoplasm of right kidney, except renal pelvis: Secondary | ICD-10-CM

## 2018-12-30 LAB — CBC WITH DIFFERENTIAL (CANCER CENTER ONLY)
Abs Immature Granulocytes: 0.02 10*3/uL (ref 0.00–0.07)
Basophils Absolute: 0 10*3/uL (ref 0.0–0.1)
Basophils Relative: 1 %
Eosinophils Absolute: 0.3 10*3/uL (ref 0.0–0.5)
Eosinophils Relative: 4 %
HCT: 37.4 % — ABNORMAL LOW (ref 39.0–52.0)
Hemoglobin: 12.8 g/dL — ABNORMAL LOW (ref 13.0–17.0)
Immature Granulocytes: 0 %
Lymphocytes Relative: 37 %
Lymphs Abs: 2.4 10*3/uL (ref 0.7–4.0)
MCH: 30.2 pg (ref 26.0–34.0)
MCHC: 34.2 g/dL (ref 30.0–36.0)
MCV: 88.2 fL (ref 80.0–100.0)
Monocytes Absolute: 0.7 10*3/uL (ref 0.1–1.0)
Monocytes Relative: 11 %
Neutro Abs: 3.1 10*3/uL (ref 1.7–7.7)
Neutrophils Relative %: 47 %
Platelet Count: 202 10*3/uL (ref 150–400)
RBC: 4.24 MIL/uL (ref 4.22–5.81)
RDW: 12.4 % (ref 11.5–15.5)
WBC Count: 6.5 10*3/uL (ref 4.0–10.5)
nRBC: 0 % (ref 0.0–0.2)

## 2018-12-30 LAB — CMP (CANCER CENTER ONLY)
ALT: 15 U/L (ref 0–44)
AST: 13 U/L — ABNORMAL LOW (ref 15–41)
Albumin: 4.1 g/dL (ref 3.5–5.0)
Alkaline Phosphatase: 40 U/L (ref 38–126)
Anion gap: 10 (ref 5–15)
BUN: 14 mg/dL (ref 8–23)
CO2: 28 mmol/L (ref 22–32)
Calcium: 9.2 mg/dL (ref 8.9–10.3)
Chloride: 106 mmol/L (ref 98–111)
Creatinine: 1.45 mg/dL — ABNORMAL HIGH (ref 0.61–1.24)
GFR, Est AFR Am: 54 mL/min — ABNORMAL LOW (ref >60)
GFR, Estimated: 47 mL/min — ABNORMAL LOW (ref >60)
Glucose, Bld: 104 mg/dL — ABNORMAL HIGH (ref 70–99)
Potassium: 4.2 mmol/L (ref 3.5–5.1)
Sodium: 144 mmol/L (ref 135–145)
Total Bilirubin: 0.4 mg/dL (ref 0.3–1.2)
Total Protein: 7 g/dL (ref 6.5–8.1)

## 2019-01-03 ENCOUNTER — Inpatient Hospital Stay: Payer: Medicare HMO | Admitting: Oncology

## 2019-01-03 ENCOUNTER — Other Ambulatory Visit: Payer: Self-pay

## 2019-01-03 VITALS — BP 125/75 | HR 75 | Temp 98.7°F | Resp 17 | Ht 68.0 in | Wt 200.5 lb

## 2019-01-03 DIAGNOSIS — C641 Malignant neoplasm of right kidney, except renal pelvis: Secondary | ICD-10-CM | POA: Diagnosis not present

## 2019-01-03 DIAGNOSIS — Z905 Acquired absence of kidney: Secondary | ICD-10-CM | POA: Diagnosis not present

## 2019-01-03 DIAGNOSIS — Z9221 Personal history of antineoplastic chemotherapy: Secondary | ICD-10-CM | POA: Diagnosis not present

## 2019-01-03 DIAGNOSIS — Z85528 Personal history of other malignant neoplasm of kidney: Secondary | ICD-10-CM | POA: Diagnosis not present

## 2019-01-03 NOTE — Progress Notes (Signed)
Hematology and Oncology Follow Up Visit  [name] [id number] [birth year] 73 y.o. 01/03/2019 9:49 AM [name], MDElkins, [name], *   Principle Diagnosis: 73 year old man with renal cell carcinoma diagnosed in 2016 and subsequently developed stage IV disease in 2019.  Prior Therapy: He underwent a radical nephrectomy completed by Dr. Louis Meckel on February 01, 2015.  The final pathology revealed a 3.3 cm clear cell renal cell carcinoma with Fuhrman grade 3.  Margins were negative indicating stage T1 a disease  Nivolumab at 3 mg/mg and ipilimumab at 1 mg/kg cycle 1 started on 09/21/2017.  He completed 4 cycles of therapy.     Current therapy: Active surveillance.  Interim History: [name] is here for a follow-up visit.  Since the last visit, he reports no major changes in his health.  He continues to be on Cortef supplements twice a day with no symptoms of adrenal insufficiency.  He continues to eat reasonably well though his appetite slightly down and lost few pounds.  He does report some abdominal fullness at times after he is eating but no nausea, vomiting or diarrhea.  Denies any hematochezia or melena.    He denied headaches, blurry vision, syncope or seizures.  Denies any fevers, chills or sweats.  Denied chest pain, palpitation, orthopnea or leg edema.  Denied cough, wheezing or hemoptysis.  Denied nausea, vomiting or abdominal pain.  Denies any constipation or diarrhea.  Denies any frequency urgency or hesitancy.  Denies any arthralgias or myalgias.  Denies any skin rashes or lesions.  Denies any bleeding or clotting tendency.  Denies any easy bruising.  Denies any hair or nail changes.  Denies any anxiety or depression.  Remaining review of system is negative.          .      Medications: Without any changes on review. Current Outpatient Medications  Medication Sig Dispense Refill  . acetaminophen (TYLENOL) 325 MG tablet Take 2 tablets (650 mg total) by  mouth every 6 (six) hours as needed for mild pain or headache (over the counter). 30 tablet 3  . aspirin 81 MG chewable tablet Chew 81 mg by mouth daily.    Marland Kitchen diltiazem (CARDIZEM CD) 120 MG 24 hr capsule     . hydrocortisone (CORTEF) 10 MG tablet Take 1.5 tablets (15 mg total) by mouth every morning AND 0.5 tablets (5 mg total) every evening. 180 tablet 1  . levothyroxine (SYNTHROID) 150 MCG tablet Take 1 tablet (150 mcg total) by mouth daily. 90 tablet 3  . Multiple Vitamin (MULTIVITAMIN WITH MINERALS) TABS tablet Take 1 tablet by mouth daily. 30 tablet 3  . pantoprazole (PROTONIX) 40 MG tablet Take 1 tablet (40 mg total) by mouth daily at 6 (six) AM. (Patient taking differently: Take 40 mg by mouth daily as needed (reflux). ) 30 tablet 3  . temazepam (RESTORIL) 7.5 MG capsule Take 1 capsule (7.5 mg total) by mouth at bedtime as needed for sleep. 30 capsule 0   No current facility-administered medications for this visit.      Allergies:  Allergies  Allergen Reactions  . Benadryl [Diphenhydramine] Other (See Comments)    Severe restless legs    Past Medical History, Surgical history, Social history, and Family History unchanged on review.    Physical Exam:   Blood pressure 125/75, pulse 75, temperature 98.7 F (37.1 C), temperature source Temporal, resp. rate 17, height 5\' 8"  (1.727 m), weight 200 lb 8 oz (90.9 kg), SpO2 95 %.  ECOG: 1   General appearance: Comfortable appearing without any discomfort Head: Normocephalic without any trauma Oropharynx: Mucous membranes are moist and pink without any thrush or ulcers. Eyes: Pupils are equal and round reactive to light. Lymph nodes: No cervical, supraclavicular, inguinal or axillary lymphadenopathy.   Heart:regular rate and rhythm.  S1 and S2 without leg edema. Lung: Clear without any rhonchi or wheezes.  No dullness to percussion. Abdomin: Soft, nontender, nondistended with good bowel sounds.  No  hepatosplenomegaly. Musculoskeletal: No joint deformity or effusion.  Full range of motion noted. Neurological: No deficits noted on motor, sensory and deep tendon reflex exam. Skin: No petechial rash or dryness.  Appeared moist.           Lab Results: Lab Results  Component Value Date   WBC 6.5 12/30/2018   HGB 12.8 (L) 12/30/2018   HCT 37.4 (L) 12/30/2018   MCV 88.2 12/30/2018   PLT 202 12/30/2018     Chemistry      Component Value Date/Time   NA 144 12/30/2018 0843   NA 134 03/11/2018 0913   K 4.2 12/30/2018 0843   CL 106 12/30/2018 0843   CO2 28 12/30/2018 0843   BUN 14 12/30/2018 0843   BUN 17 03/11/2018 0913   CREATININE 1.45 (H) 12/30/2018 0843      Component Value Date/Time   CALCIUM 9.2 12/30/2018 0843   ALKPHOS 40 12/30/2018 0843   AST 13 (L) 12/30/2018 0843   ALT 15 12/30/2018 0843   BILITOT 0.4 12/30/2018 0843          Impression and Plan:  73 year old man with:   1.  Renal cell carcinoma diagnosed in 2016 and subsequently developed stage IV disease in 2019.   He had an excellent response to immunotherapy although he did develop pituitary gland dysfunction and have been off treatment for the last year.  He continues to be on active surveillance with last imaging studies did not show any evidence of relapsed disease.  He was scheduled to have imaging studies prior to today's visit although these images did not take place and we will reschedule for the immediate future.  Different salvage therapy may be needed if he developed relapsed disease.  Note will be in the form of oral targeted therapy such as axitinib or cabozantinib.      2.  Immune mediated complications: No residual complications noted at this time.  He has a pituitary dysfunction is improving.    3.  Prognosis and goals of care: Although he experienced excellent response to therapy his disease remains incurable and aggressive measures are warranted given his excellent  performance status.  4.  Follow-up: Will be determined pending the results of his imaging studies.  25  minutes was spent with the patient face-to-face today.  More than 50% of time was dedicated to reviewing his disease status, treatment options as well as future plan of care.   Zola Button, MD 11/17/20209:49 AM

## 2019-01-04 ENCOUNTER — Telehealth: Payer: Self-pay | Admitting: Oncology

## 2019-01-04 NOTE — Telephone Encounter (Signed)
Scheduled appt per 11/17 los.  Printed and mailed appt calendar.

## 2019-01-11 ENCOUNTER — Other Ambulatory Visit: Payer: Self-pay

## 2019-01-11 ENCOUNTER — Ambulatory Visit (HOSPITAL_COMMUNITY)
Admission: RE | Admit: 2019-01-11 | Discharge: 2019-01-11 | Disposition: A | Discharge status: Home / Self Care | Payer: Medicare HMO | Source: Ambulatory Visit | Attending: Oncology | Admitting: Oncology

## 2019-01-11 DIAGNOSIS — R918 Other nonspecific abnormal finding of lung field: Secondary | ICD-10-CM | POA: Diagnosis not present

## 2019-01-11 DIAGNOSIS — C641 Malignant neoplasm of right kidney, except renal pelvis: Secondary | ICD-10-CM | POA: Diagnosis not present

## 2019-01-11 MED ORDER — SODIUM CHLORIDE (PF) 0.9 % IJ SOLN
INTRAMUSCULAR | Status: AC
  Filled 2019-01-11: qty 50

## 2019-01-11 MED ORDER — IOHEXOL 300 MG/ML  SOLN
100.0000 mL | Freq: Once | INTRAMUSCULAR | Status: AC | PRN
  Administered 2019-01-11: 12:00:00 80 mL via INTRAVENOUS

## 2019-01-31 DIAGNOSIS — Z23 Encounter for immunization: Secondary | ICD-10-CM | POA: Diagnosis not present

## 2019-02-21 ENCOUNTER — Ambulatory Visit: Payer: Medicare HMO | Admitting: Internal Medicine

## 2019-03-09 ENCOUNTER — Other Ambulatory Visit: Payer: Self-pay

## 2019-03-09 ENCOUNTER — Encounter: Payer: Self-pay | Admitting: Internal Medicine

## 2019-03-09 ENCOUNTER — Ambulatory Visit: Payer: Medicare HMO | Admitting: Internal Medicine

## 2019-03-09 VITALS — BP 130/62 | HR 72 | Ht 68.0 in | Wt 195.0 lb

## 2019-03-09 DIAGNOSIS — E871 Hypo-osmolality and hyponatremia: Secondary | ICD-10-CM | POA: Diagnosis not present

## 2019-03-09 DIAGNOSIS — E2749 Other adrenocortical insufficiency: Secondary | ICD-10-CM

## 2019-03-09 DIAGNOSIS — E039 Hypothyroidism, unspecified: Secondary | ICD-10-CM

## 2019-03-09 MED ORDER — HYDROCORTISONE 10 MG PO TABS: ORAL_TABLET | ORAL | 1 refills | Status: DC

## 2019-03-09 NOTE — Patient Instructions (Addendum)
Please come for labs in 1.5 months.  Please continue levothyroxine 150 mcg daily  Take the thyroid hormone every day, with water, at least 30 minutes before breakfast, separated by at least 4 hours from: - acid reflux medications - calcium - iron - multivitamins  Please move the Protonix at night.  Please continue hydrocortisone 5 mg in a.m. and 5 mg in p.m., no later than 6 PM.  - You absolutely need to take this medication every day and not skip doses. - Please double the dose if you have a fever, for the duration of the fever. - If you cannot take anything by mouth (vomiting) or you have severe diarrhea so that you eliminate the hydrocortisone pills in your stool, please make sure that you get the hydrocortisone in the vein instead - go to the nearest emergency department/urgent care or you may go to your PCPs office  - Please try to get a MedAlert bracelet or pendant indicating: "Adrenal insufficiency".  Please return in 6 months.

## 2019-03-09 NOTE — Progress Notes (Signed)
Patient ID: [name], male   DOB: February 04, 1946, 74 y.o.   MRN: [id number]   This visit occurred during the SARS-CoV-2 public health emergency.  Safety protocols were in place, including screening questions prior to the visit, additional usage of staff PPE, and extensive cleaning of exam room while observing appropriate contact time as indicated for disinfecting solutions.   HPI  [name] is a 74 y.o.-year-old male, returning for follow-up for hypothyroidism and adrenal insufficiency.  Last OV 7 months ago.   Reviewed history: Patient was admitted for weakness on 03/13/2018 with mild hyponatremia of 129.  However, he was recently admitted on 03/24/2018 with severe hyponatremia, at 115.  At that time, he was found to be profoundly hypothyroid and also adrenal insufficiency.  He was started on hormonal replacement for both conditions and his sodium was slowly repeated.  His sodium improved to normal before discharge.  He was sent home with levothyroxine and hydrocortisone.  He was advised to see endocrinology in 1 month.  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 He had recurrence in 09/2017 >> metastases in Lung, Liver, and L kidney. He is on ipilimumab Curt Bears) and Nivolumab (Opdivo) - started 09/2017, continued q3 weeks x 4 >> stopped after developing weakness in ~12/2017.  Reviewing his oncologist note, he will continue with p.o. Biologics if needed after he has a new CT scan.  Primary Hypothyroidism: Pt. has been dx with hypothyroidism in 03/2018.>> started on levothyroxine.  We subsequently increased to 125 mcg daily in 07/2018 and then increase the dose to 150 mcg daily, last dose change 10/2018.  He takes the levothyroxine: - in am - fasting - at least 30 min from b'fast - no Ca, Fe, + PPIs with b'fast!!-Added since last visit - + Multivitamins at night - not on Biotin  Reviewed his TFTs: Lab Results  Component Value  Date   TSH 4.24 12/06/2018   TSH 5.47 (H) 10/31/2018   TSH 12.01 (H) 09/14/2018   TSH 34.68 (H) 08/03/2018   TSH 64.97 (H) 04/19/2018   TSH 86.397 (H) 03/24/2018   TSH 66.300 (H) 03/24/2018   TSH 0.312 (L) 01/06/2018   TSH 1.756 09/20/2017   FREET4 1.01 12/06/2018   FREET4 1.00 10/31/2018   FREET4 0.92 09/14/2018   FREET4 0.73 08/03/2018   FREET4 0.37 (L) 04/19/2018   FREET4 <0.25 (L) 03/24/2018   T3FREE 0.5 (L) 03/24/2018   Received labs from PCP, on 10/19/2018: CBC with low hemoglobin, at 12.5 CMP with high glucose at 178, BUN/creatinine 18/1.78, GFR 37, otherwise normal TSH 2.64, free T4 1.35  Antithyroid antibodies: No results found for: THGAB No components found for: TPOAB  Pt denies: - feeling nodules in neck - hoarseness - dysphagia - choking - SOB with lying down She has no FH of thyroid disorders. No FH of thyroid cancer. No h/o radiation tx to head or neck.  No herbal supplements. No Biotin use. No recent steroids use.   Adrenal insufficiency:  He was found to have a low cortisol, of 2.4 on 03/24/2018.  A cosyntropin stimulation test was abnormal in the next day: Component     Latest Ref Rng & Units 03/24/2018 03/25/2018  Cortisol, Base     ug/dL  2.2  Cortisol, 30 Min     ug/dL  6.9  Cortisol, 60 Min     ug/dL  8.6  Cortisol, Plasma     ug/dL 2.4  He had significant fatigue, weakness, before starting hydrocortisone.  He was started on a higher dose, 20 mg a.m. and 10 mg in a.m. and he felt much better immediately afterwards.  We were able to subsequently decrease the dose to 10 mg in a.m. and 5 mg in p.m. However, this visit, he tells me that he is actually taking 5 mg in a.m. and 5 mg in p.m. as he misunderstood the instructions and he did not read the after visit summary given at last visit.  He denies -Unintentional weight loss -Nausea -Weakness -Dizziness -Headaches  Hyponatremia: -Developed in 02/2018 -Most likely secondary to uncontrolled  central adrenal insufficiency and hypothyroidism  Reviewed the sodium levels: Lab Results  Component Value Date   NA 144 12/30/2018   NA 140 08/30/2018   NA 142 05/17/2018   NA 137 04/08/2018   NA 135 03/28/2018   NA 135 03/27/2018   NA 129 (L) 03/26/2018   NA 124 (L) 03/25/2018   NA 123 (L) 03/25/2018   NA 120 (L) 03/25/2018   NA 117 (LL) 03/24/2018   NA 116 (LL) 03/24/2018   NA 115 (LL) 03/24/2018   NA 116 (L) 03/24/2018   NA 120 (L) 03/22/2018   NA 136 03/16/2018   NA 134 (L) 03/15/2018   NA 131 (L) 03/14/2018   NA 129 (L) 03/13/2018   NA 134 03/11/2018   NA 136 03/04/2018   NA 135 02/10/2018   NA 141 01/26/2018   NA 139 01/12/2018   NA 137 01/06/2018   NA 140 12/30/2017   NA 141 12/15/2017   NA 141 11/24/2017   NA 141 11/03/2017   NA 140 10/13/2017   He had lung CA in 1993-1994.  He quit smoking in 1990  ROS: Constitutional: no weight gain/no weight loss, no fatigue, no subjective hyperthermia, no subjective hypothermia Eyes: no blurry vision, no xerophthalmia ENT: no sore throat, + see HPI Cardiovascular: no CP/no SOB/no palpitations/no leg swelling Respiratory: no cough/no SOB/no wheezing Gastrointestinal: no N/no V/no D/no C/no acid reflux Musculoskeletal: no muscle aches/no joint aches Skin: no rashes, no hair loss Neurological: no tremors/no numbness/no tingling/no dizziness  I reviewed pt's medications, allergies, PMH, social hx, family hx, and changes were documented in the history of present illness. Otherwise, unchanged from my initial visit note.  Past Medical History:  Diagnosis Date  . Arthritis   . Chronic kidney disease    only has one kidney   . 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 02/01/2015  . Coronary artery disease    followed by Dr.Tilley  . GERD (gastroesophageal reflux disease)   . Heart murmur   . High cholesterol   . Hx of cancer of lung 1980's  . Hypertension   . Incisional hernia 08/01/2015  .  Lung cancer ([place]) 1993  . Lung metastases ([place]) 2019  . Recurrent umbilical hernia 08/12/9483  . Right renal mass    Past Surgical History:  Procedure Laterality Date  . APPENDECTOMY  age 2  . CHOLECYSTECTOMY  1980's  . LAPAROSCOPIC LYSIS OF ADHESIONS N/A 08/01/2015   Procedure: LAPAROSCOPIC LYSIS OF ADHESIONS;  Surgeon: Michael Boston, MD;  Location: [facility];  Service: General;  Laterality: N/A;  . LUNG LOBECTOMY  1992   lung cancer- patient has staples in lung not to have MRI per patient   . PILONIDAL CYST EXCISION  2011   Dr Harlow Asa  . ROBOTIC ASSITED PARTIAL NEPHRECTOMY Right 02/01/2015   Procedure: RIGHT ROBOTIC ASSISTED  LAPAROSOCOPY NEPHRECTOMY;  Surgeon: Ardis Hughs, MD;  Location: [facility];  Service: Urology;  Laterality: Right;  . VENTRAL HERNIA REPAIR N/A 08/01/2015   Procedure: LAPAROSCOPIC VENTRAL WALL HERNIA WITH MESH;  Surgeon: Michael Boston, MD;  Location: [facility];  Service: General;  Laterality: N/A;   Social History   Socioeconomic History  . Marital status: Married    Spouse name: Not on file  . Number of children: Not on file  . Years of education: Not on file  . Highest education level: Not on file  Occupational History  . Occupation: Retired  Tobacco Use  . Smoking status: Former Smoker    Packs/day: 1.00    Years: 20.00    Pack years: 20.00    Quit date: 01/29/1988    Years since quitting: 31.1  . Smokeless tobacco: Never Used  Substance and Sexual Activity  . Alcohol use: Yes    Comment: rare  . Drug use: No  . Sexual activity: Not on file  Other Topics Concern  . Not on file  Social History Narrative  . Not on file   Social Determinants of Health   Financial Resource Strain:   . Difficulty of Paying Living Expenses: Not on file  Food Insecurity:   . Worried About Charity fundraiser in the Last Year: Not on file  . Ran Out of Food in the Last Year: Not on file  Transportation Needs:   . Lack of Transportation (Medical): Not on file  . Lack  of Transportation (Non-Medical): Not on file  Physical Activity:   . Days of Exercise per Week: Not on file  . Minutes of Exercise per Session: Not on file  Stress:   . Feeling of Stress : Not on file  Social Connections:   . Frequency of Communication with Friends and Family: Not on file  . Frequency of Social Gatherings with Friends and Family: Not on file  . Attends Religious Services: Not on file  . Active Member of Clubs or Organizations: Not on file  . Attends Archivist Meetings: Not on file  . Marital Status: Not on file  Intimate Partner Violence:   . Fear of Current or Ex-Partner: Not on file  . Emotionally Abused: Not on file  . Physically Abused: Not on file  . Sexually Abused: Not on file   Current Outpatient Medications on File Prior to Visit  Medication Sig Dispense Refill  . acetaminophen (TYLENOL) 325 MG tablet Take 2 tablets (650 mg total) by mouth every 6 (six) hours as needed for mild pain or headache (over the counter). 30 tablet 3  . aspirin 81 MG chewable tablet Chew 81 mg by mouth daily.    Marland Kitchen diltiazem (CARDIZEM CD) 120 MG 24 hr capsule     . hydrocortisone (CORTEF) 10 MG tablet Take 1.5 tablets (15 mg total) by mouth every morning AND 0.5 tablets (5 mg total) every evening. 180 tablet 1  . levothyroxine (SYNTHROID) 150 MCG tablet Take 1 tablet (150 mcg total) by mouth daily. 90 tablet 3  . Multiple Vitamin (MULTIVITAMIN WITH MINERALS) TABS tablet Take 1 tablet by mouth daily. 30 tablet 3  . pantoprazole (PROTONIX) 40 MG tablet Take 1 tablet (40 mg total) by mouth daily at 6 (six) AM. (Patient taking differently: Take 40 mg by mouth daily as needed (reflux). ) 30 tablet 3  . temazepam (RESTORIL) 7.5 MG capsule Take 1 capsule (7.5 mg total) by mouth at bedtime as needed for  sleep. 30 capsule 0   No current facility-administered medications on file prior to visit.   Allergies  Allergen Reactions  . Benadryl [Diphenhydramine] Other (See Comments)     Severe restless legs   Family History  Problem Relation Age of Onset  . Heart attack Father    Cardiologist: Dr. Sallyanne Kuster.  PE: BP 130/62   Pulse 72   Ht 5\' 8"  (1.727 m)   Wt 195 lb (88.5 kg)   SpO2 95%   BMI 29.65 kg/m  Wt Readings from Last 3 Encounters:  03/09/19 195 lb (88.5 kg)  01/03/19 200 lb 8 oz (90.9 kg)  09/01/18 203 lb (92.1 kg)   Constitutional: overweight, in NAD Eyes: PERRLA, EOMI, no exophthalmos ENT: moist mucous membranes, no thyromegaly, no cervical lymphadenopathy Cardiovascular: RRR, No MRG Respiratory: CTA B Gastrointestinal: abdomen soft, NT, ND, BS+ Musculoskeletal: no deformities, strength intact in all 4 Skin: moist, warm, no rashes Neurological: no tremor with outstretched hands, DTR normal in all 4  ASSESSMENT: 1. Primary Hypothyroidism  2. Central Adrenal Insufficiency  3. Hyponatremia  PLAN:  1. Patient with primary hypothyroidism, on levothyroxine -Likely secondary to ipilimumab -He was admitted in 03/2018 and a TSH was found to be 86.  He was initially given LT4 iv, then 50 mcg p.o. we increased the dose afterwards, last dose change was right before last visit, to 125 mcg daily.  Since then, we had to increase it further to 150 mcg daily. - latest thyroid labs reviewed with pt >> normal: Lab Results  Component Value Date   TSH 4.24 12/06/2018   - he continues on LT4 150 mcg daily (last dose increase in 10/2018) - pt feels good on this dose. - we discussed about taking the thyroid hormone every day, with water, >30 minutes before breakfast, separated by >4 hours from acid reflux medications, calcium, iron, multivitamins. Pt. was taking it correctly at last visit but since then she added a PPI and is taking this in the morning.  I advised him to move it more than 4 hours after levothyroxine - will check thyroid tests in 1.5 months after the above change: TSH and fT4  2. Central Adrenal Insufficiency -Likely secondary to ipilimumab -He  was previously on hydrocortisone 20 mg in a.m. and 10 mg in p.m. and we decreased the dose to a more physiologic 10 mg in a.m. and 5 mg in p.m. However, due to misunderstood instructions, he is only taking 5 mg twice a day -he feels well on this dose, without increased fatigue, dizziness, nausea, abdominal pain >> therefore, we discussed about continuing the same dose for now but I strongly advised him to read the instructions that I am giving him or call me if he has any questions about them. -He did not have to double the dose since last office visit -No need for parenteral steroids since last office visit -He did not get the med alert bracelet since last visit - I again advised him to get it -Again reiterated sick day rules:  If you cannot keep anything down, including your hydrocortisone medication, please go to the emergency room or your primary care physician office to get steroids injected in the muscle or vein.   If you have a fever (more than 100 Fahrenheit) or gastroenteritis with nausea/vomiting and diarrhea, please double the dose of your hydrocortisone for the duration of the fever or the gastroenteritis.  Do not run out of your hydrocortisone medication.  3. Hyponatremia -He had a  sodium of 115 during his last admission from 03/2018  -Likely secondary to uncontrolled hypothyroidism and central adrenal insufficiency, which are now treated -His hyponatremia resolved>> latest sodium level was normal in 12/2018  Orders Placed This Encounter  Procedures  . T4, free  . TSH   Philemon Kingdom, MD PhD [facility] Endocrinology

## 2019-03-22 DIAGNOSIS — M79642 Pain in left hand: Secondary | ICD-10-CM | POA: Diagnosis not present

## 2019-03-22 DIAGNOSIS — G5603 Carpal tunnel syndrome, bilateral upper limbs: Secondary | ICD-10-CM | POA: Diagnosis not present

## 2019-03-22 DIAGNOSIS — G5602 Carpal tunnel syndrome, left upper limb: Secondary | ICD-10-CM | POA: Diagnosis not present

## 2019-03-22 DIAGNOSIS — G5601 Carpal tunnel syndrome, right upper limb: Secondary | ICD-10-CM | POA: Diagnosis not present

## 2019-03-22 DIAGNOSIS — M79641 Pain in right hand: Secondary | ICD-10-CM | POA: Diagnosis not present

## 2019-03-27 ENCOUNTER — Telehealth: Payer: Self-pay | Admitting: Internal Medicine

## 2019-03-27 MED ORDER — HYDROCORTISONE 5 MG PO TABS: ORAL_TABLET | ORAL | 2 refills | Status: DC

## 2019-03-27 NOTE — Telephone Encounter (Signed)
RX changed to correct amount and sent to pharmacy.  Patient daughter notified.

## 2019-03-27 NOTE — Telephone Encounter (Signed)
Patients daughter called saying patients instructions for the hydrocortisone was to take .5mg  twice a day - but the pharmacy gave patient 20mg  and the patient is having to cut the pill into sections to take. They are asking if there was anyway to lower the dosage so they don't have to cut their pill.  Patients daughters ph# [phone]  Pharmacy :   [facility] Phone:  [phone]  Fax:  [phone]

## 2019-03-29 DIAGNOSIS — R1011 Right upper quadrant pain: Secondary | ICD-10-CM | POA: Diagnosis not present

## 2019-04-05 ENCOUNTER — Inpatient Hospital Stay: Payer: Medicare HMO | Admitting: Oncology

## 2019-04-05 ENCOUNTER — Other Ambulatory Visit: Payer: Self-pay

## 2019-04-05 ENCOUNTER — Inpatient Hospital Stay: Payer: Medicare HMO | Attending: Oncology

## 2019-04-05 VITALS — BP 131/71 | HR 83 | Temp 98.7°F | Resp 18 | Ht 68.0 in | Wt 191.4 lb

## 2019-04-05 DIAGNOSIS — R109 Unspecified abdominal pain: Secondary | ICD-10-CM | POA: Insufficient documentation

## 2019-04-05 DIAGNOSIS — Z905 Acquired absence of kidney: Secondary | ICD-10-CM | POA: Insufficient documentation

## 2019-04-05 DIAGNOSIS — C641 Malignant neoplasm of right kidney, except renal pelvis: Secondary | ICD-10-CM

## 2019-04-05 DIAGNOSIS — Z9221 Personal history of antineoplastic chemotherapy: Secondary | ICD-10-CM | POA: Diagnosis not present

## 2019-04-05 DIAGNOSIS — Z8553 Personal history of malignant neoplasm of renal pelvis: Secondary | ICD-10-CM | POA: Insufficient documentation

## 2019-04-05 LAB — CBC WITH DIFFERENTIAL (CANCER CENTER ONLY)
Abs Immature Granulocytes: 0.03 10*3/uL (ref 0.00–0.07)
Basophils Absolute: 0 10*3/uL (ref 0.0–0.1)
Basophils Relative: 0 %
Eosinophils Absolute: 0.2 10*3/uL (ref 0.0–0.5)
Eosinophils Relative: 3 %
HCT: 38.4 % — ABNORMAL LOW (ref 39.0–52.0)
Hemoglobin: 13.1 g/dL (ref 13.0–17.0)
Immature Granulocytes: 1 %
Lymphocytes Relative: 40 %
Lymphs Abs: 2.5 10*3/uL (ref 0.7–4.0)
MCH: 30 pg (ref 26.0–34.0)
MCHC: 34.1 g/dL (ref 30.0–36.0)
MCV: 88.1 fL (ref 80.0–100.0)
Monocytes Absolute: 0.8 10*3/uL (ref 0.1–1.0)
Monocytes Relative: 13 %
Neutro Abs: 2.7 10*3/uL (ref 1.7–7.7)
Neutrophils Relative %: 43 %
Platelet Count: 255 10*3/uL (ref 150–400)
RBC: 4.36 MIL/uL (ref 4.22–5.81)
RDW: 12.2 % (ref 11.5–15.5)
WBC Count: 6.2 10*3/uL (ref 4.0–10.5)
nRBC: 0 % (ref 0.0–0.2)

## 2019-04-05 LAB — CMP (CANCER CENTER ONLY)
ALT: 16 U/L (ref 0–44)
AST: 14 U/L — ABNORMAL LOW (ref 15–41)
Albumin: 4.1 g/dL (ref 3.5–5.0)
Alkaline Phosphatase: 47 U/L (ref 38–126)
Anion gap: 10 (ref 5–15)
BUN: 24 mg/dL — ABNORMAL HIGH (ref 8–23)
CO2: 29 mmol/L (ref 22–32)
Calcium: 9.5 mg/dL (ref 8.9–10.3)
Chloride: 106 mmol/L (ref 98–111)
Creatinine: 1.59 mg/dL — ABNORMAL HIGH (ref 0.61–1.24)
GFR, Est AFR Am: 48 mL/min — ABNORMAL LOW (ref >60)
GFR, Estimated: 42 mL/min — ABNORMAL LOW (ref >60)
Glucose, Bld: 102 mg/dL — ABNORMAL HIGH (ref 70–99)
Potassium: 4.4 mmol/L (ref 3.5–5.1)
Sodium: 145 mmol/L (ref 135–145)
Total Bilirubin: 0.3 mg/dL (ref 0.3–1.2)
Total Protein: 7.3 g/dL (ref 6.5–8.1)

## 2019-04-05 NOTE — Progress Notes (Signed)
Hematology and Oncology Follow Up Visit  [name] [id number] 1945/12/07 74 y.o. 04/05/2019 10:27 AM [name], MDElkins, Curt Perry, *   Principle Diagnosis: 74 year old man with stage IV renal cell carcinoma documented in 2019.  He was initially diagnosed with localized disease in 2016.  Prior Therapy: He underwent a radical nephrectomy completed by Dr. Louis Perry on February 01, 2015.  The final pathology revealed a 3.3 cm clear cell renal cell carcinoma with Fuhrman grade 3.  Margins were negative indicating stage T1 a disease  Nivolumab at 3 mg/mg and ipilimumab at 1 mg/kg cycle 1 started on 09/21/2017.  He completed 4 cycles of therapy.     Current therapy: Active surveillance.  Interim History: [name] returns today for a repeat evaluation.  Since the last visit, he reports no major changes in his health.  Continues to have right upper quadrant abdominal nagging discomfort which has been going on for few months.  This has affected his appetite and of lost close to 10 pounds in the last 6 months.  He does report a slight improvement in his appetite as of late and he feels that he is eating better.  He is scheduled to have a repeat CT scan of the abdomen pelvis ordered by his primary care physician.          .      Medications: Unchanged on review. Current Outpatient Medications  Medication Sig Dispense Refill  . acetaminophen (TYLENOL) 325 MG tablet Take 2 tablets (650 mg total) by mouth every 6 (six) hours as needed for mild pain or headache (over the counter). 30 tablet 3  . aspirin 81 MG chewable tablet Chew 81 mg by mouth daily.    Marland Kitchen diltiazem (CARDIZEM CD) 120 MG 24 hr capsule     . hydrocortisone (CORTEF) 5 MG tablet Take 1 tablet (5 mg total) by mouth every morning AND 1 tablet (5 mg total) every evening. 180 tablet 2  . levothyroxine (SYNTHROID) 150 MCG tablet Take 1 tablet (150 mcg total) by mouth daily. 90 tablet 3  . Multiple Vitamin (MULTIVITAMIN  WITH MINERALS) TABS tablet Take 1 tablet by mouth daily. 30 tablet 3  . pantoprazole (PROTONIX) 40 MG tablet Take 1 tablet (40 mg total) by mouth daily at 6 (six) AM. (Patient taking differently: Take 40 mg by mouth daily as needed (reflux). ) 30 tablet 3  . temazepam (RESTORIL) 7.5 MG capsule Take 1 capsule (7.5 mg total) by mouth at bedtime as needed for sleep. 30 capsule 0   No current facility-administered medications for this visit.     Allergies:  Allergies  Allergen Reactions  . Benadryl [Diphenhydramine] Other (See Comments)    Severe restless legs        Physical Exam:   Blood pressure 131/71, pulse 83, temperature 98.7 F (37.1 C), temperature source Temporal, resp. rate 18, height 5\' 8"  (1.727 m), weight 191 lb 6.4 oz (86.8 kg), SpO2 96 %.     ECOG: 1    General appearance: Alert, awake without any distress. Head: Atraumatic without abnormalities Oropharynx: Without any thrush or ulcers. Eyes: No scleral icterus. Lymph nodes: No lymphadenopathy noted in the cervical, supraclavicular, or axillary nodes Heart:regular rate and rhythm, without any murmurs or gallops.   Lung: Clear to auscultation without any rhonchi, wheezes or dullness to percussion. Abdomin: Soft, nontender without any shifting dullness or ascites. Musculoskeletal: No clubbing or cyanosis. Neurological: No motor or sensory deficits. Skin: No rashes or lesions.  Lab Results: Lab Results  Component Value Date   WBC 6.2 04/05/2019   HGB 13.1 04/05/2019   HCT 38.4 (L) 04/05/2019   MCV 88.1 04/05/2019   PLT 255 04/05/2019     Chemistry      Component Value Date/Time   NA 144 12/30/2018 0843   NA 134 03/11/2018 0913   K 4.2 12/30/2018 0843   CL 106 12/30/2018 0843   CO2 28 12/30/2018 0843   BUN 14 12/30/2018 0843   BUN 17 03/11/2018 0913   CREATININE 1.45 (H) 12/30/2018 0843      Component Value Date/Time   CALCIUM 9.2 12/30/2018 0843   ALKPHOS 40 12/30/2018 0843    AST 13 (L) 12/30/2018 0843   ALT 15 12/30/2018 0843   BILITOT 0.4 12/30/2018 0843          Impression and Plan:  74 year old man with:   1.  Stage IV renal cell carcinoma with pulmonary involvement documented in 2019 after initial diagnosis and localized disease in 2016. Marland Kitchen   He is currently on active surveillance after achieving a near complete response from immunotherapy without any clear-cut evidence of metastatic disease.  Imaging studies in November 2020 were personally reviewed and discussed with the patient and there is no convincing evidence of progression of disease.  The natural course of disease and the need for additional therapy were reviewed.  These would be in the form of oral targeted therapy with axitinib or cabozantinib.  For the time being this option will be deferred unless he has progression of disease..      2.  Immune mediated complications: No new complications noted at this time.  Continues to get hormone replacement.  3.  Abdominal pain: Unclear etiology although worsening metastatic disease is always a possibility.  He is scheduled to have CT scan to confirm the etiology.  4.  Prognosis and goals of care: Aggressive measures are warranted overall his disease might not be curable at this time.  5.  Follow-up: In 4 months for repeat evaluation.  This will be sooner if he requires anticancer treatment.  30  minutes was dedicated to this visit.  The time was spent on reviewing his disease status, imaging studies as well as addressing future plan of care.   Alex Button, MD 2/17/202110:27 AM

## 2019-04-06 ENCOUNTER — Telehealth: Payer: Self-pay | Admitting: Oncology

## 2019-04-06 NOTE — Telephone Encounter (Signed)
Scheduled appt per 2/17 los.  Sent a message to HIM pool to get a calendar mailed out.

## 2019-04-09 IMAGING — CT CT CHEST W/ CM
4 of 16 series · 9 of 46 positions shown, 15 images · IV contrast (OMNIPAQUE)
Comparison: CT chest dated 09/09/2017. CT abdomen/pelvis dated
08/03/2017.

CLINICAL DATA: Right lung cancer status post resection. Metastatic
renal cancer status post nephrectomy with lung metastases.

EXAM:
CT CHEST WITH CONTRAST
CT ABDOMEN AND PELVIS WITHOUT AND WITH CONTRAST
TECHNIQUE: Multidetector CT imaging of the abdomen and pelvis was performed
following the standard protocol before and during bolus
administration of intravenous contrast. Multidetector CT imaging of
the chest was performed following the standard protocol during bolus
administration of intravenous contrast.
CONTRAST:  80mL OMNIPAQUE IOHEXOL 300 MG/ML  SOLN

[Series 2: axial pre · axial · non-contrast · 0.87mm/px · z∈[-296,-194]mm · 2 of 103 slices shown]
[im 35/103  soft-tissue]
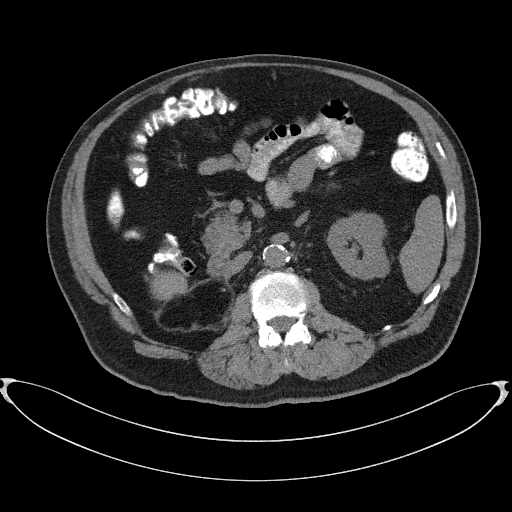
[im 69/103  soft-tissue]
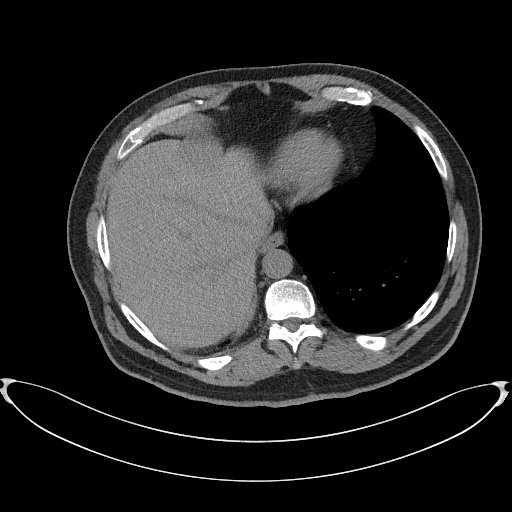

[Series 3: coronal pre · coronal · non-contrast · 0.60mm/px · 1 of 111 slices shown, 2 images]
[im 56/111  soft-tissue]
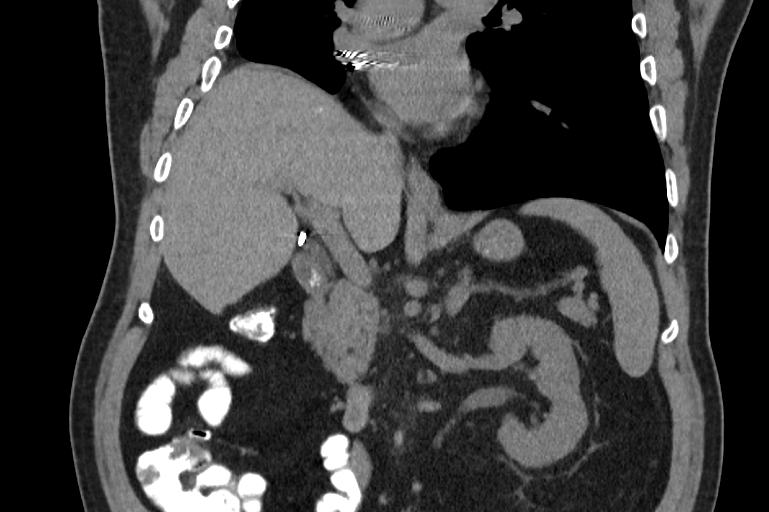
[im 56/111  bone]
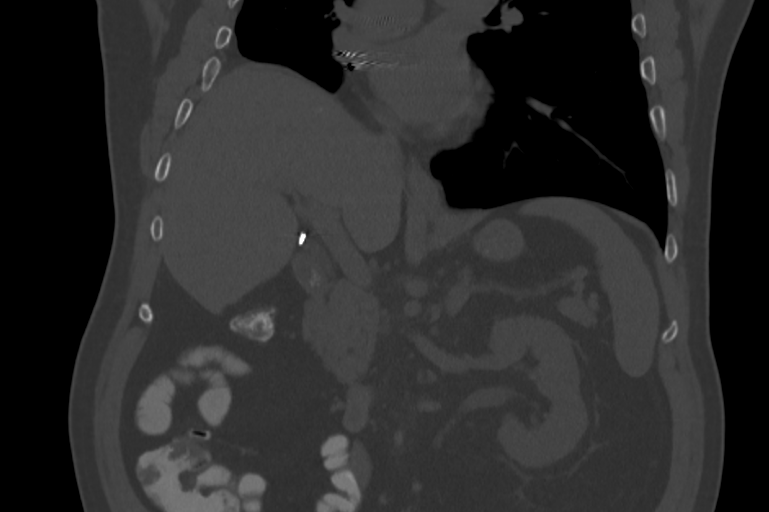

[Series 6: axial arterial · axial · arterial · 0.86mm/px · z∈[-284,-188]mm · 2 of 96 slices shown]
[im 32/96  soft-tissue]
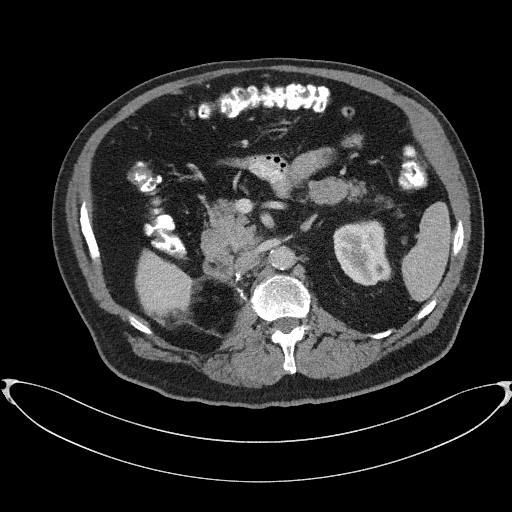
[im 64/96  soft-tissue]
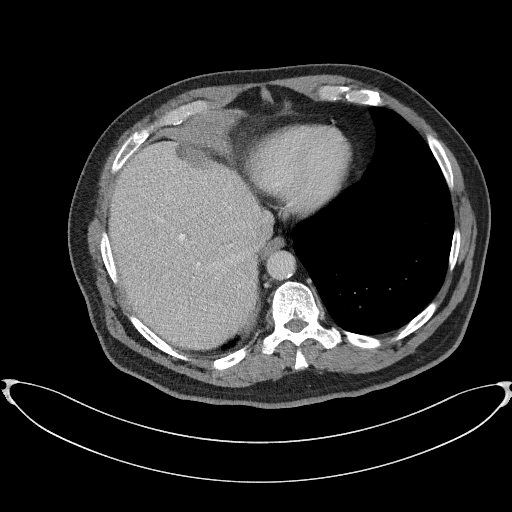

[Series 16: axial nephro · axial · 0.86mm/px · z∈[-496,-190]mm · 4 of 171 slices shown, 9 images]
[im 35/171  soft-tissue]
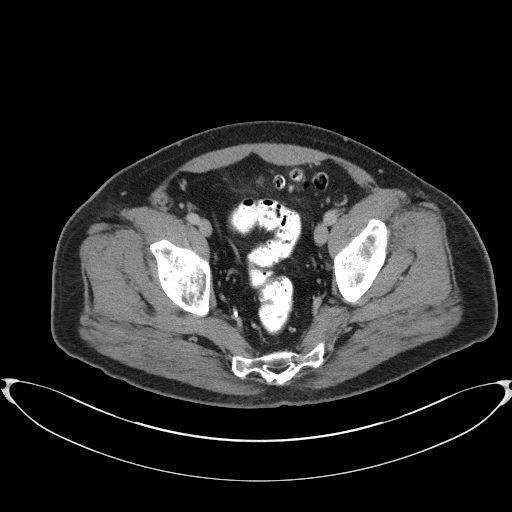
[im 35/171  lung]
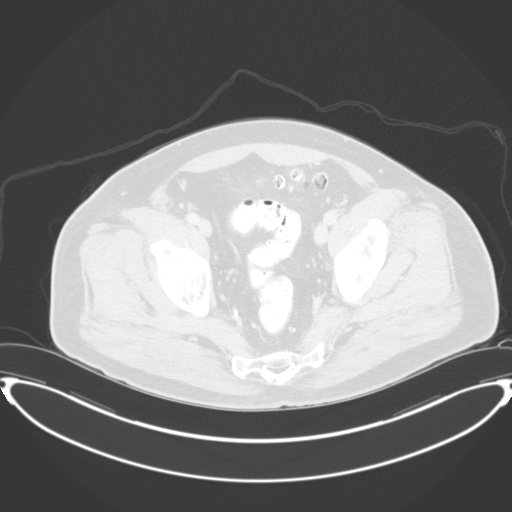
[im 35/171  bone]
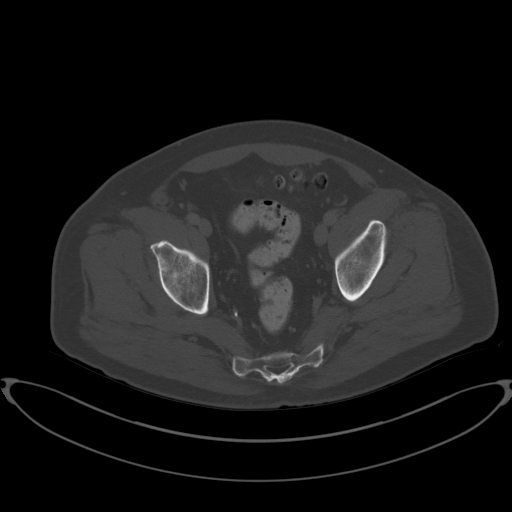
[im 69/171  soft-tissue]
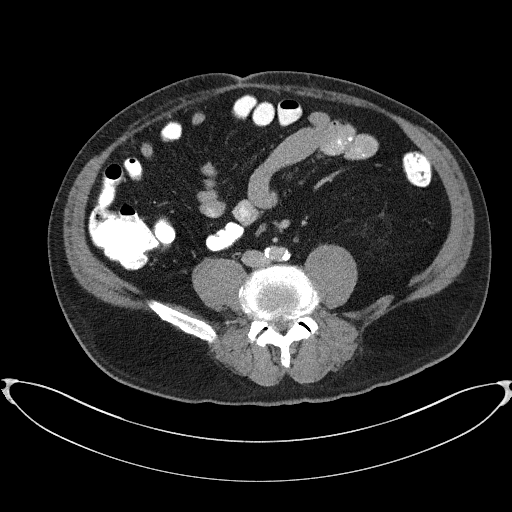
[im 69/171  lung]
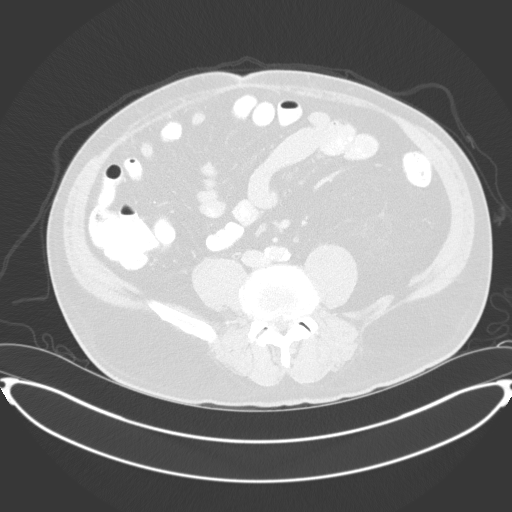
[im 103/171  soft-tissue]
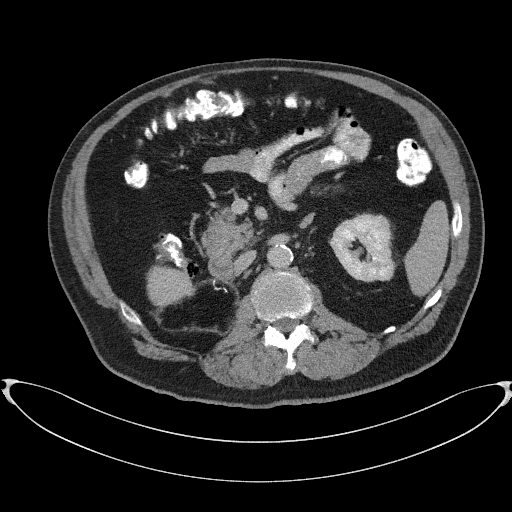
[im 103/171  lung]
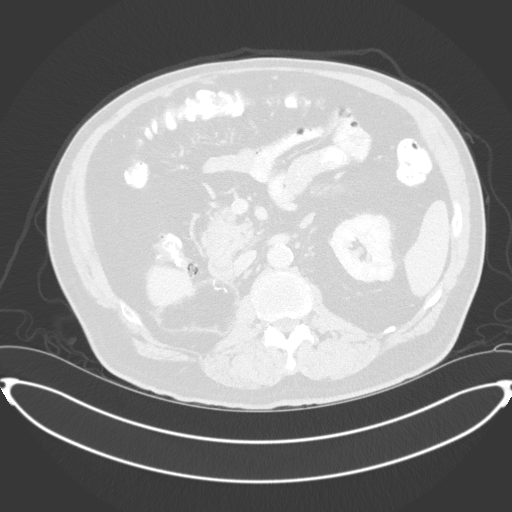
[im 137/171  soft-tissue]
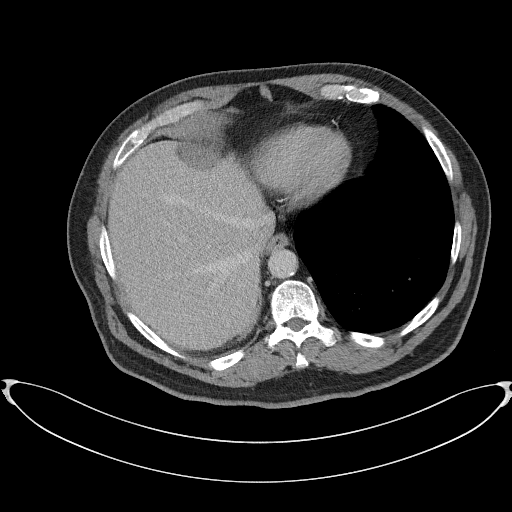
[im 137/171  lung]
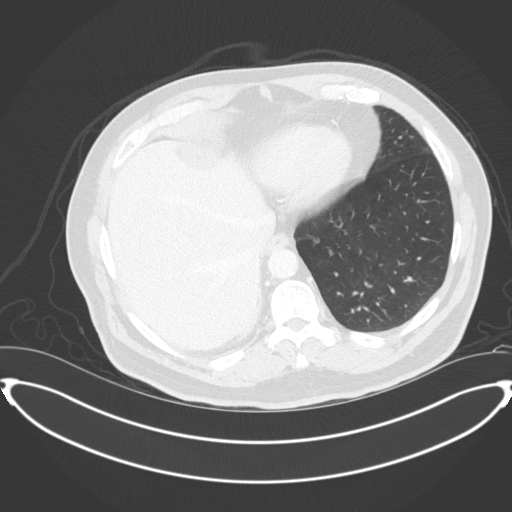

[9 of 46 positions shown; findings below may reference images not displayed]

FINDINGS: CT CHEST FINDINGS

Cardiovascular: Heart is normal in size.  No pericardial effusion.

No evidence of thoracic aortic aneurysm. Ectasia of the ascending
thoracic aorta, measuring 3.7 cm, unchanged. Atherosclerotic
calcifications of the aortic arch.

Three vessel coronary atherosclerosis.

Mediastinum/Nodes: No suspicious mediastinal lymphadenopathy.

Visualized thyroid is unremarkable.

Lungs/Pleura: Status post right lower lobectomy.

Residual 8 mm central left lower lobe nodule/metastasis (series
15/image 78), previously 1.6 x 1.8 cm. Prior 6 mm left upper lobe
nodule has resolved.

No focal consolidation.

No pleural effusion or pneumothorax.

Musculoskeletal: Mild degenerative changes of the mid thoracic
spine.

CT ABDOMEN PELVIS FINDINGS

Hepatobiliary: Chronic 4.9 x 3.9 cm liver lesion in segment 4A
(series 11/image 37), unchanged from 3924, benign

Residual 13 mm hypoenhancing lesion along the posterior aspect of
segment 6 adjacent to the hepatic renal fossa (series 16/image 9),
corresponding to the prior 3.1 x 3.2 cm heterogeneous enhancing
lesion in this region, favoring improving metastasis.

Status post cholecystectomy. No intrahepatic or extrahepatic ductal
dilatation.

Pancreas: Within normal limits.

Spleen: Within limits.

Adrenals/Urinary Tract: Adrenal glands within normal limits.

Status post right nephrectomy with postsurgical changes/fat packing
in the surgical bed.

7 mm interpolar left renal cyst (series 16/image 39). No enhancing
renal lesions. No hydronephrosis.

Bladder is mildly thick-walled although underdistended.

Stomach/Bowel: Stomach is within normal limits.

No evidence of bowel obstruction.

Appendix is not discretely visualized.

Extensive colonic diverticulosis, without evidence of
diverticulitis.

Vascular/Lymphatic: No evidence of abdominal aortic aneurysm.

Atherosclerotic calcifications of the abdominal aorta and branch
vessels.

14 mm short axis portacaval node (series 16/image 58), unchanged, at
the upper limits of normal.

11 mm short axis retrocaval node (series 16/image 62), previously
2.9 cm.

Reproductive: Prostate is grossly unremarkable.

Other: No abdominopelvic ascites.

1.2 x 1.9 cm peritoneal implant beneath the left mid abdominal wall
(series 16/image 36), previously 2.7 x 4.2 cm.

Musculoskeletal: Mild degenerative changes at L5-S1.
IMPRESSION: Status post right nephrectomy for renal cell cancer. Status post
right lower lobectomy for lung cancer.

Improving left lung metastases, with a residual 8 mm nodule in the
left lower lobe.

Improving hepatic metastasis along the posterior aspect of segment
6, now measuring 1.3 cm.

Improving upper abdominal nodal metastases, including an 11 mm short
axis retrocaval node.

Improving peritoneal implant beneath the left mid abdominal wall,
now measuring 1.2 x 1.9 cm.

## 2019-04-11 DIAGNOSIS — M65342 Trigger finger, left ring finger: Secondary | ICD-10-CM | POA: Diagnosis not present

## 2019-04-11 DIAGNOSIS — G5602 Carpal tunnel syndrome, left upper limb: Secondary | ICD-10-CM | POA: Diagnosis not present

## 2019-04-13 DIAGNOSIS — Z905 Acquired absence of kidney: Secondary | ICD-10-CM | POA: Diagnosis not present

## 2019-04-13 DIAGNOSIS — K769 Liver disease, unspecified: Secondary | ICD-10-CM | POA: Diagnosis not present

## 2019-04-20 ENCOUNTER — Other Ambulatory Visit (INDEPENDENT_AMBULATORY_CARE_PROVIDER_SITE_OTHER): Payer: Medicare HMO

## 2019-04-20 ENCOUNTER — Other Ambulatory Visit: Payer: Self-pay

## 2019-04-20 DIAGNOSIS — E039 Hypothyroidism, unspecified: Secondary | ICD-10-CM | POA: Diagnosis not present

## 2019-04-20 LAB — TSH: TSH: 2.15 u[IU]/mL (ref 0.35–4.50)

## 2019-04-20 LAB — T4, FREE: Free T4: 1.04 ng/dL (ref 0.60–1.60)

## 2019-04-21 ENCOUNTER — Telehealth: Payer: Self-pay

## 2019-04-21 NOTE — Telephone Encounter (Signed)
-----   Message from Philemon Kingdom, MD sent at 04/20/2019  2:48 PM EST ----- Lenna Sciara, can you please call pt: Thyroid tests are normal.  Please continue the current dose of levothyroxine.

## 2019-04-21 NOTE — Telephone Encounter (Signed)
Notified patient of message from Dr. Gherghe, patient expressed understanding and agreement. No further questions.  

## 2019-04-25 DIAGNOSIS — M79642 Pain in left hand: Secondary | ICD-10-CM | POA: Diagnosis not present

## 2019-05-02 DIAGNOSIS — R109 Unspecified abdominal pain: Secondary | ICD-10-CM | POA: Diagnosis not present

## 2019-05-02 DIAGNOSIS — R630 Anorexia: Secondary | ICD-10-CM | POA: Diagnosis not present

## 2019-05-02 DIAGNOSIS — R634 Abnormal weight loss: Secondary | ICD-10-CM | POA: Diagnosis not present

## 2019-05-15 DIAGNOSIS — R634 Abnormal weight loss: Secondary | ICD-10-CM | POA: Diagnosis not present

## 2019-05-15 DIAGNOSIS — R5383 Other fatigue: Secondary | ICD-10-CM | POA: Diagnosis not present

## 2019-05-15 DIAGNOSIS — K5900 Constipation, unspecified: Secondary | ICD-10-CM | POA: Diagnosis not present

## 2019-05-25 DIAGNOSIS — R69 Illness, unspecified: Secondary | ICD-10-CM | POA: Diagnosis not present

## 2019-05-29 ENCOUNTER — Telehealth: Payer: Self-pay | Admitting: Internal Medicine

## 2019-05-29 NOTE — Telephone Encounter (Signed)
Patients daughter called stating the patient has been having no energy, no appetite, been loosing weight due to not eating and she was concerned it might have to do with his thyroid. Best ph# [phone].

## 2019-05-30 NOTE — Telephone Encounter (Signed)
His thyroid tests were normal last month.  I doubt that this is related to the thyroid but if he missed any thyroid medication or if the suspicion is high, we can definitely repeat them.  If he does want to come to have it repeated, can you please order a TSH and a free T4?

## 2019-05-31 NOTE — Telephone Encounter (Signed)
Spoke to patient's daughter, she has scheduled him to see Dr. Cruzita Lederer tomorrow.

## 2019-06-01 ENCOUNTER — Encounter: Payer: Self-pay | Admitting: Internal Medicine

## 2019-06-01 ENCOUNTER — Ambulatory Visit: Payer: Medicare HMO | Admitting: Internal Medicine

## 2019-06-01 ENCOUNTER — Other Ambulatory Visit: Payer: Self-pay

## 2019-06-01 VITALS — BP 110/60 | HR 95 | Ht 68.0 in | Wt 182.0 lb

## 2019-06-01 DIAGNOSIS — E039 Hypothyroidism, unspecified: Secondary | ICD-10-CM | POA: Diagnosis not present

## 2019-06-01 DIAGNOSIS — E2749 Other adrenocortical insufficiency: Secondary | ICD-10-CM

## 2019-06-01 LAB — T4, FREE: Free T4: 1.18 ng/dL (ref 0.60–1.60)

## 2019-06-01 LAB — TSH: TSH: 0.98 u[IU]/mL (ref 0.35–4.50)

## 2019-06-01 MED ORDER — HYDROCORTISONE 5 MG PO TABS: ORAL_TABLET | ORAL | 3 refills | Status: DC

## 2019-06-01 NOTE — Progress Notes (Signed)
Patient ID: [name], male   DOB: [birth year], 74 y.o.   MRN: [id number]   This visit occurred during the SARS-CoV-2 public health emergency.  Safety protocols were in place, including screening questions prior to the visit, additional usage of staff PPE, and extensive cleaning of exam room while observing appropriate contact time as indicated for disinfecting solutions.   HPI  [name] is a 74 y.o.-year-old male, returning for follow-up for hypothyroidism and adrenal insufficiency.  Last OV 3 months ago.  He is here with his daughter who offers part of the history, especially about his symptoms, medication doses and activity level.  They described increased fatigue and decreased stamina and decreased appetite >> lost almost 20 lbs on his scale at home.  Reviewed history: Patient was admitted for weakness on 03/13/2018 with mild hyponatremia of 129.  However, he was recently admitted on 03/24/2018 with severe hyponatremia, at 115.  At that time, he was found to be profoundly hypothyroid and also adrenal insufficiency.  He was started on hormonal replacement for both conditions and his sodium was slowly repeated.  His sodium improved to normal before discharge.  He was sent home with levothyroxine and hydrocortisone.  He was advised to see endocrinology in 1 month.  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 He had recurrence in 09/2017 >> metastases in Lung, Liver, and L kidney. He is on ipilimumab Curt Bears) and Nivolumab (Opdivo) - started 09/2017, continued q3 weeks x 4 >> stopped after developing weakness in ~12/2017.  Reviewing his oncologist note, he will continue with p.o. Biologics if needed after he has a new CT scan.  Primary Hypothyroidism: He was diagnosed with hypothyroidism in 03/2018 and started on levothyroxine. We subsequently increased to 125 mcg daily in 07/2018 and then increased the dose in 10/2018.  Pt is now on  levothyroxine 150 mcg daily, taken: - in am, ~ 8:30 am - fasting - at least 30 min from b'fast - no Ca, Fe, + MVIs at night - stopped PPIs (was taking this with b'fast at last visit) - on Pepcid bid - first dose at 9 am!! - not on Biotin  Reviewed his TFTs: Lab Results  Component Value Date   TSH 2.15 04/20/2019   TSH 4.24 12/06/2018   TSH 5.47 (H) 10/31/2018   TSH 12.01 (H) 09/14/2018   TSH 34.68 (H) 08/03/2018   TSH 64.97 (H) 04/19/2018   TSH 86.397 (H) 03/24/2018   TSH 66.300 (H) 03/24/2018   TSH 0.312 (L) 01/06/2018   TSH 1.756 09/20/2017   FREET4 1.04 04/20/2019   FREET4 1.01 12/06/2018   FREET4 1.00 10/31/2018   FREET4 0.92 09/14/2018   FREET4 0.73 08/03/2018   FREET4 0.37 (L) 04/19/2018   FREET4 <0.25 (L) 03/24/2018   T3FREE 0.5 (L) 03/24/2018   Received labs from PCP, on 10/19/2018: CBC with low hemoglobin, at 12.5 CMP with high glucose at 178, BUN/creatinine 18/1.78, GFR 37, otherwise normal TSH 2.64, free T4 1.35  Antithyroid antibodies: No results found for: THGAB No components found for: TPOAB  Pt denies: - feeling nodules in neck - hoarseness - dysphagia - choking - SOB with lying down  No FH of thyroid disease or thyroid cancer. No h/o radiation tx to head or neck.  No herbal supplements. No Biotin use.    Adrenal insufficiency:  He was found to have a low cortisol, of 2.4 on 03/24/2018.    A cosyntropin  stimulation test was abnormal the next day: Component     Latest Ref Rng & Units 03/24/2018 03/25/2018  Cortisol, Base     ug/dL  2.2  Cortisol, 30 Min     ug/dL  6.9  Cortisol, 60 Min     ug/dL  8.6  Cortisol, Plasma     ug/dL 2.4    He had significant fatigue, weakness, before starting hydrocortisone.  He was started on a higher dose, 20 mg a.m. and 10 mg in a.m. and he felt much better immediately afterwards.  We were able to subsequently decrease the dose to 10 mg in a.m. and 5 mg in p.m. however, at last visit, he was actually taking 5 mg  twice a day as he misunderstood the instructions.  Since he was asymptomatic, we continued the same dose with specific instructions about calling me if he developed adrenal insufficiency symptoms.  At last visit, he denied : -Unintentional weight loss -Nausea -Weakness -Dizziness -Headaches  At that time, we did not decrease his hydrocortisone dose.  However, since then, he developed weight loss, decreased appetite, and significant fatigue.  Hyponatremia: -Developed in 02/2018 -Most likely secondary to uncontrolled central adrenal insufficiency hypothyroidism  Reviewed his sodium levels: Lab Results  Component Value Date   NA 145 04/05/2019   NA 144 12/30/2018   NA 140 08/30/2018   NA 142 05/17/2018   NA 137 04/08/2018   NA 135 03/28/2018   NA 135 03/27/2018   NA 129 (L) 03/26/2018   NA 124 (L) 03/25/2018   NA 123 (L) 03/25/2018   NA 120 (L) 03/25/2018   NA 117 (LL) 03/24/2018   NA 116 (LL) 03/24/2018   NA 115 (LL) 03/24/2018   NA 116 (L) 03/24/2018   NA 120 (L) 03/22/2018   NA 136 03/16/2018   NA 134 (L) 03/15/2018   NA 131 (L) 03/14/2018   NA 129 (L) 03/13/2018   NA 134 03/11/2018   NA 136 03/04/2018   NA 135 02/10/2018   NA 141 01/26/2018   NA 139 01/12/2018   NA 137 01/06/2018   NA 140 12/30/2017   NA 141 12/15/2017   NA 141 11/24/2017   NA 141 11/03/2017   He had lung CA in 1993-1994.  He quit smoking in 1990  ROS: Constitutional: no weight gain/+ weight loss, + fatigue, no subjective hyperthermia, no subjective hypothermia Eyes: no blurry vision, no xerophthalmia ENT: no sore throat, + see HPI Cardiovascular: no CP/no SOB/no palpitations/no leg swelling Respiratory: no cough/no SOB/no wheezing Gastrointestinal: no N/no V/no D/no C/no acid reflux Musculoskeletal: no muscle aches/no joint aches Skin: no rashes, no hair loss Neurological: no tremors/no numbness/no tingling/no dizziness  I reviewed pt's medications, allergies, PMH, social hx, family  hx, and changes were documented in the history of present illness. Otherwise, unchanged from my initial visit note.  Past Medical History:  Diagnosis Date  . Arthritis   . Chronic kidney disease    only has one kidney   . 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 02/01/2015  . Coronary artery disease    followed by Dr.Tilley  . GERD (gastroesophageal reflux disease)   . Heart murmur   . High cholesterol   . Hx of cancer of lung 1980's  . Hypertension   . Incisional hernia 08/01/2015  . Lung cancer ([place]) 1993  . Lung metastases ([place]) 2019  . Recurrent umbilical hernia 10/14/9369  . Right renal mass    Past Surgical History:  Procedure Laterality Date  . APPENDECTOMY  age 45  . CHOLECYSTECTOMY  1980's  . LAPAROSCOPIC LYSIS OF ADHESIONS N/A 08/01/2015   Procedure: LAPAROSCOPIC LYSIS OF ADHESIONS;  Surgeon: Michael Boston, MD;  Location: [facility];  Service: General;  Laterality: N/A;  . LUNG LOBECTOMY  1992   lung cancer- patient has staples in lung not to have MRI per patient   . PILONIDAL CYST EXCISION  2011   Dr Harlow Asa  . ROBOTIC ASSITED PARTIAL NEPHRECTOMY Right 02/01/2015   Procedure: RIGHT ROBOTIC ASSISTED LAPAROSOCOPY NEPHRECTOMY;  Surgeon: Ardis Hughs, MD;  Location: [facility];  Service: Urology;  Laterality: Right;  . VENTRAL HERNIA REPAIR N/A 08/01/2015   Procedure: LAPAROSCOPIC VENTRAL WALL HERNIA WITH MESH;  Surgeon: Michael Boston, MD;  Location: [facility];  Service: General;  Laterality: N/A;   Social History   Socioeconomic History  . Marital status: Married    Spouse name: Not on file  . Number of children: Not on file  . Years of education: Not on file  . Highest education level: Not on file  Occupational History  . Occupation: Retired  Tobacco Use  . Smoking status: Former Smoker    Packs/day: 1.00    Years: 20.00    Pack years: 20.00    Quit date: 01/29/1988    Years since quitting: 31.3  . Smokeless tobacco: Never Used  Substance and  Sexual Activity  . Alcohol use: Yes    Comment: rare  . Drug use: No  . Sexual activity: Not on file  Other Topics Concern  . Not on file  Social History Narrative  . Not on file   Social Determinants of Health   Financial Resource Strain:   . Difficulty of Paying Living Expenses:   Food Insecurity:   . Worried About Charity fundraiser in the Last Year:   . Arboriculturist in the Last Year:   Transportation Needs:   . Film/video editor (Medical):   Marland Kitchen Lack of Transportation (Non-Medical):   Physical Activity:   . Days of Exercise per Week:   . Minutes of Exercise per Session:   Stress:   . Feeling of Stress :   Social Connections:   . Frequency of Communication with Friends and Family:   . Frequency of Social Gatherings with Friends and Family:   . Attends Religious Services:   . Active Member of Clubs or Organizations:   . Attends Archivist Meetings:   Marland Kitchen Marital Status:   Intimate Partner Violence:   . Fear of Current or Ex-Partner:   . Emotionally Abused:   Marland Kitchen Physically Abused:   . Sexually Abused:    Current Outpatient Medications on File Prior to Visit  Medication Sig Dispense Refill  . acetaminophen (TYLENOL) 325 MG tablet Take 2 tablets (650 mg total) by mouth every 6 (six) hours as needed for mild pain or headache (over the counter). 30 tablet 3  . aspirin 81 MG chewable tablet Chew 81 mg by mouth daily.    Marland Kitchen diltiazem (CARDIZEM CD) 120 MG 24 hr capsule     . hydrocortisone (CORTEF) 5 MG tablet Take 1 tablet (5 mg total) by mouth every morning AND 1 tablet (5 mg total) every evening. 180 tablet 2  . levothyroxine (SYNTHROID) 150 MCG tablet Take 1 tablet (150 mcg total) by mouth daily. 90 tablet 3  . Multiple Vitamin (MULTIVITAMIN WITH MINERALS) TABS tablet Take 1 tablet by mouth daily. 30 tablet 3  .  pantoprazole (PROTONIX) 40 MG tablet Take 1 tablet (40 mg total) by mouth daily at 6 (six) AM. (Patient taking differently: Take 40 mg by mouth daily  as needed (reflux). ) 30 tablet 3  . temazepam (RESTORIL) 7.5 MG capsule Take 1 capsule (7.5 mg total) by mouth at bedtime as needed for sleep. 30 capsule 0   No current facility-administered medications on file prior to visit.   Allergies  Allergen Reactions  . Benadryl [Diphenhydramine] Other (See Comments)    Severe restless legs   Family History  Problem Relation Age of Onset  . Heart attack Father    Cardiologist: Dr. Sallyanne Kuster.  PE: BP 110/60   Pulse 95   Ht 5\' 8"  (1.727 m)   Wt 182 lb (82.6 kg)   SpO2 95%   BMI 27.67 kg/m  Wt Readings from Last 3 Encounters:  06/01/19 182 lb (82.6 kg)  04/05/19 191 lb 6.4 oz (86.8 kg)  03/09/19 195 lb (88.5 kg)   Constitutional: overweight, in NAD Eyes: PERRLA, EOMI, no exophthalmos ENT: moist mucous membranes, no thyromegaly, no cervical lymphadenopathy Cardiovascular: RRR, No MRG Respiratory: CTA B Gastrointestinal: abdomen soft, NT, ND, BS+ Musculoskeletal: no deformities, strength intact in all 4 Skin: moist, warm, no rashes Neurological: no tremor with outstretched hands, DTR normal in all 4  ASSESSMENT: 1. Primary Hypothyroidism  2. Central Adrenal Insufficiency  3. Hyponatremia -Low sodium: 115 during his admission from 03/2018, likely due to uncontrolled hypothyroidism and central adrenal insufficiency, which are not treated -Sodium normalized afterwards  PLAN:  1. Patient with primary hypothyroidism, on levothyroxine -Likely secondary to ipilimumab -He was admitted in 03/2018 and a TSH was found to be 86.  He was initially given LT4 iv, then 50 mcg p.o. we increased the dose afterwards, last dose change was right before last visit, to 125 mcg daily.  Since then, we had to increase the dose further. - he continues on LT4 150 mcg daily - pt feels good on this dose. - latest thyroid labs reviewed with pt >> normal: Lab Results  Component Value Date   TSH 2.15 04/20/2019  - we discussed about taking the thyroid  hormone every day, with water, >30 minutes before breakfast, separated by >4 hours from acid reflux medications, calcium, iron, multivitamins. Pt. is taking it correctly, but since last visit he stopped the PPI and started H2 blocker, which he takes almost at the same time with levothyroxine.  I again advised him to move acid reflux medications later in the day.  For H2 blockers, the distance between levothyroxine and medication can be shorter, but to avoid any type of interference, I still advised him to lunchtime - will check thyroid tests today: TSH and fT4 - If labs are abnormal, he will need to return for repeat TFTs in 1.5 months  2. Central Adrenal Insufficiency -Likely secondary to ipilimumab -He did not have to double the dose of hydrocortisone or obtained this parenterally since last visit -he was previously on hydrocortisone 20 mg in the a.m. and 10 mg in p.m. and I advised him to decrease the dose to a more physiologic 10 mg in a.m. and 5 mg in p.m.  However, he misunderstood instructions and was only taking 5 mg twice a day at last visit.  However, since he did not have increased fatigue, dizziness, nausea, abdominal pain, we continued the same dose but I advised him to let me know if he developed the above symptoms. -He called me a few days  ago with fatigue suspecting may be his levothyroxine dose might be incorrect.  However, his TSH obtained a month ago was normal. -At this visit, we discussed about increasing his hydrocortisone dose to 10 mg in a.m. and 5 mg in p.m. to see how he feels -He still does not have a med alert bracelet-I again advised him to obtain one: -Again reiterated sick day rules:  If you cannot keep anything down, including your hydrocortisone medication, please go to the emergency room or your primary care physician office to get steroids injected in the muscle or vein.   If you have a fever (more than 100 Fahrenheit) or gastroenteritis with nausea/vomiting and  diarrhea, please double the dose of your hydrocortisone for the duration of the fever or the gastroenteritis.  Do not run out of your hydrocortisone medication.  Office Visit on 06/01/2019  Component Date Value Ref Range Status  . Free T4 06/01/2019 1.18  0.60 - 1.60 ng/dL Final   Comment: Specimens from patients who are undergoing biotin therapy and /or ingesting biotin supplements may contain high levels of biotin.  The higher biotin concentration in these specimens interferes with this Free T4 assay.  Specimens that contain high levels  of biotin may cause false high results for this Free T4 assay.  Please interpret results in light of the total clinical presentation of the patient.    Marland Kitchen TSH 06/01/2019 0.98  0.35 - 4.50 uIU/mL Final   Thyroid tests are normal.  Philemon Kingdom, MD PhD [facility]

## 2019-06-01 NOTE — Patient Instructions (Signed)
Please increase Hydrocortisone to 10 mg in a and 5 mg in the pm (~2 pm).  - You absolutely need to take this medication every day and not skip doses. - Please double the dose if you have a fever, for the duration of the fever. - If you cannot take anything by mouth (vomiting) or you have severe diarrhea so that you eliminate the hydrocortisone pills in your stool, please make sure that you get the hydrocortisone in the vein instead - go to the nearest emergency department/urgent care or you may go to your PCPs office  - Please try to get a MedAlert bracelet or pendant indicating: "Adrenal insufficiency".  Please continue: - Levothyroxine 150 mcg daily.  Take the thyroid hormone every day, with water, at least 30 minutes before breakfast, separated by at least 4 hours from: - acid reflux medications - calcium - iron - multivitamins  Please move acid reflux medications >4h after levothyroxine.  Please come back for a follow-up appointment in 6 months.

## 2019-06-02 ENCOUNTER — Telehealth: Payer: Self-pay

## 2019-06-02 NOTE — Telephone Encounter (Signed)
-----   Message from Philemon Kingdom, MD sent at 06/01/2019  5:01 PM EDT ----- Lenna Sciara, can you please call pt: Thyroid tests are normal.

## 2019-06-05 DIAGNOSIS — H5203 Hypermetropia, bilateral: Secondary | ICD-10-CM | POA: Diagnosis not present

## 2019-06-05 DIAGNOSIS — H52223 Regular astigmatism, bilateral: Secondary | ICD-10-CM | POA: Diagnosis not present

## 2019-06-05 DIAGNOSIS — H26493 Other secondary cataract, bilateral: Secondary | ICD-10-CM | POA: Diagnosis not present

## 2019-06-06 DIAGNOSIS — M79642 Pain in left hand: Secondary | ICD-10-CM | POA: Diagnosis not present

## 2019-06-20 DIAGNOSIS — M79642 Pain in left hand: Secondary | ICD-10-CM | POA: Diagnosis not present

## 2019-07-05 DIAGNOSIS — M65342 Trigger finger, left ring finger: Secondary | ICD-10-CM | POA: Diagnosis not present

## 2019-07-05 DIAGNOSIS — G5601 Carpal tunnel syndrome, right upper limb: Secondary | ICD-10-CM | POA: Diagnosis not present

## 2019-07-05 DIAGNOSIS — Z4789 Encounter for other orthopedic aftercare: Secondary | ICD-10-CM | POA: Diagnosis not present

## 2019-07-05 DIAGNOSIS — G5602 Carpal tunnel syndrome, left upper limb: Secondary | ICD-10-CM | POA: Diagnosis not present

## 2019-07-06 DIAGNOSIS — R000 Tachycardia, unspecified: Secondary | ICD-10-CM | POA: Diagnosis not present

## 2019-07-11 DIAGNOSIS — H57813 Brow ptosis, bilateral: Secondary | ICD-10-CM | POA: Diagnosis not present

## 2019-07-11 DIAGNOSIS — H26493 Other secondary cataract, bilateral: Secondary | ICD-10-CM | POA: Diagnosis not present

## 2019-07-11 DIAGNOSIS — Z961 Presence of intraocular lens: Secondary | ICD-10-CM | POA: Diagnosis not present

## 2019-07-12 DIAGNOSIS — M79642 Pain in left hand: Secondary | ICD-10-CM | POA: Diagnosis not present

## 2019-07-20 DIAGNOSIS — S40861A Insect bite (nonvenomous) of right upper arm, initial encounter: Secondary | ICD-10-CM | POA: Diagnosis not present

## 2019-07-20 DIAGNOSIS — R21 Rash and other nonspecific skin eruption: Secondary | ICD-10-CM | POA: Diagnosis not present

## 2019-07-20 DIAGNOSIS — L2089 Other atopic dermatitis: Secondary | ICD-10-CM | POA: Diagnosis not present

## 2019-07-26 DIAGNOSIS — M79642 Pain in left hand: Secondary | ICD-10-CM | POA: Diagnosis not present

## 2019-07-26 DIAGNOSIS — M65332 Trigger finger, left middle finger: Secondary | ICD-10-CM | POA: Diagnosis not present

## 2019-07-26 DIAGNOSIS — G5601 Carpal tunnel syndrome, right upper limb: Secondary | ICD-10-CM | POA: Diagnosis not present

## 2019-07-26 DIAGNOSIS — M79641 Pain in right hand: Secondary | ICD-10-CM | POA: Diagnosis not present

## 2019-07-26 DIAGNOSIS — G5602 Carpal tunnel syndrome, left upper limb: Secondary | ICD-10-CM | POA: Diagnosis not present

## 2019-07-26 DIAGNOSIS — G5603 Carpal tunnel syndrome, bilateral upper limbs: Secondary | ICD-10-CM | POA: Diagnosis not present

## 2019-08-01 DIAGNOSIS — M79642 Pain in left hand: Secondary | ICD-10-CM | POA: Diagnosis not present

## 2019-08-03 ENCOUNTER — Inpatient Hospital Stay: Payer: Medicare HMO

## 2019-08-03 ENCOUNTER — Inpatient Hospital Stay: Payer: Medicare HMO | Attending: Oncology | Admitting: Oncology

## 2019-08-03 ENCOUNTER — Other Ambulatory Visit: Payer: Self-pay

## 2019-08-03 VITALS — BP 135/69 | HR 66 | Temp 97.8°F | Resp 18 | Ht 68.0 in | Wt 188.3 lb

## 2019-08-03 DIAGNOSIS — C641 Malignant neoplasm of right kidney, except renal pelvis: Secondary | ICD-10-CM | POA: Diagnosis not present

## 2019-08-03 DIAGNOSIS — Z905 Acquired absence of kidney: Secondary | ICD-10-CM | POA: Insufficient documentation

## 2019-08-03 DIAGNOSIS — Z85528 Personal history of other malignant neoplasm of kidney: Secondary | ICD-10-CM | POA: Insufficient documentation

## 2019-08-03 LAB — CBC WITH DIFFERENTIAL (CANCER CENTER ONLY)
Abs Immature Granulocytes: 0.02 10*3/uL (ref 0.00–0.07)
Basophils Absolute: 0 10*3/uL (ref 0.0–0.1)
Basophils Relative: 0 %
Eosinophils Absolute: 0.2 10*3/uL (ref 0.0–0.5)
Eosinophils Relative: 3 %
HCT: 40.7 % (ref 39.0–52.0)
Hemoglobin: 13.5 g/dL (ref 13.0–17.0)
Immature Granulocytes: 0 %
Lymphocytes Relative: 35 %
Lymphs Abs: 2.4 10*3/uL (ref 0.7–4.0)
MCH: 30 pg (ref 26.0–34.0)
MCHC: 33.2 g/dL (ref 30.0–36.0)
MCV: 90.4 fL (ref 80.0–100.0)
Monocytes Absolute: 0.8 10*3/uL (ref 0.1–1.0)
Monocytes Relative: 12 %
Neutro Abs: 3.4 10*3/uL (ref 1.7–7.7)
Neutrophils Relative %: 50 %
Platelet Count: 194 10*3/uL (ref 150–400)
RBC: 4.5 MIL/uL (ref 4.22–5.81)
RDW: 13.6 % (ref 11.5–15.5)
WBC Count: 6.8 10*3/uL (ref 4.0–10.5)
nRBC: 0 % (ref 0.0–0.2)

## 2019-08-03 LAB — CMP (CANCER CENTER ONLY)
ALT: 12 U/L (ref 0–44)
AST: 12 U/L — ABNORMAL LOW (ref 15–41)
Albumin: 4.1 g/dL (ref 3.5–5.0)
Alkaline Phosphatase: 62 U/L (ref 38–126)
Anion gap: 10 (ref 5–15)
BUN: 22 mg/dL (ref 8–23)
CO2: 28 mmol/L (ref 22–32)
Calcium: 9.5 mg/dL (ref 8.9–10.3)
Chloride: 105 mmol/L (ref 98–111)
Creatinine: 1.54 mg/dL — ABNORMAL HIGH (ref 0.61–1.24)
GFR, Est AFR Am: 50 mL/min — ABNORMAL LOW (ref >60)
GFR, Estimated: 43 mL/min — ABNORMAL LOW (ref >60)
Glucose, Bld: 96 mg/dL (ref 70–99)
Potassium: 4.4 mmol/L (ref 3.5–5.1)
Sodium: 143 mmol/L (ref 135–145)
Total Bilirubin: 0.4 mg/dL (ref 0.3–1.2)
Total Protein: 7.4 g/dL (ref 6.5–8.1)

## 2019-08-03 NOTE — Progress Notes (Signed)
Hematology and Oncology Follow Up Visit  Equan Cogbill [id number] 07-26-1945 74 y.o. 08/03/2019 10:05 AM [name], MDElkins, [name], *   Principle Diagnosis: 74 year old man with renal cell carcinoma diagnosed in 2016.  He developed stage IV in 2019 with hepatic involvement.  Prior Therapy: He underwent a radical nephrectomy completed by Dr. Louis Meckel on February 01, 2015.  The final pathology revealed a 3.3 cm clear cell renal cell carcinoma with Fuhrman grade 3.  Margins were negative indicating stage T1 a disease  Nivolumab at 3 mg/mg and ipilimumab at 1 mg/kg cycle 1 started on 09/21/2017.  He completed 4 cycles of therapy.   He did not receive any additional immunotherapy after developing adrenal insufficiency and panhypopituitarism.   Current therapy: Active surveillance.  Interim History: Mr. Lightner is here for repeat evaluation.  Since the last visit, he reports feeling well without any major complaints.  Denies any nausea, vomiting or abdominal pain or weight loss.  He continues to be on thyroid supplement and tapering doses of Cortef.  His performance status and quality of life remained excellent.  He has not reported any bone pain or pathological fractures.  He denies any recent hospitalizations or illnesses.          .      Medications: Updated on review. Current Outpatient Medications  Medication Sig Dispense Refill  . acetaminophen (TYLENOL) 325 MG tablet Take 2 tablets (650 mg total) by mouth every 6 (six) hours as needed for mild pain or headache (over the counter). 30 tablet 3  . aspirin 81 MG chewable tablet Chew 81 mg by mouth daily.    Marland Kitchen diltiazem (CARDIZEM CD) 120 MG 24 hr capsule     . famotidine (PEPCID) 20 MG tablet Take 20 mg by mouth 2 (two) times daily.    . hydrocortisone (CORTEF) 5 MG tablet Take 2 tablets in am and 1 tablet in pm, ~ 2pm 300 tablet 3  . levothyroxine (SYNTHROID) 150 MCG tablet Take 1 tablet (150 mcg total) by mouth daily.  90 tablet 3  . Multiple Vitamin (MULTIVITAMIN WITH MINERALS) TABS tablet Take 1 tablet by mouth daily. 30 tablet 3  . temazepam (RESTORIL) 7.5 MG capsule Take 1 capsule (7.5 mg total) by mouth at bedtime as needed for sleep. 30 capsule 0   No current facility-administered medications for this visit.     Allergies:  Allergies  Allergen Reactions  . Benadryl [Diphenhydramine] Other (See Comments)    Severe restless legs        Physical Exam:    Blood pressure 135/69, pulse 66, temperature 97.8 F (36.6 C), temperature source Temporal, resp. rate 18, height 5\' 8"  (1.727 m), weight 188 lb 4.8 oz (85.4 kg), SpO2 97 %.     ECOG: 1   General appearance: Comfortable appearing without any discomfort Head: Normocephalic without any trauma Oropharynx: Mucous membranes are moist and pink without any thrush or ulcers. Eyes: Pupils are equal and round reactive to light. Lymph nodes: No cervical, supraclavicular, inguinal or axillary lymphadenopathy.   Heart:regular rate and rhythm.  S1 and S2 without leg edema. Lung: Clear without any rhonchi or wheezes.  No dullness to percussion. Abdomin: Soft, nontender, nondistended with good bowel sounds.  No hepatosplenomegaly. Musculoskeletal: No joint deformity or effusion.  Full range of motion noted. Neurological: No deficits noted on motor, sensory and deep tendon reflex exam. Skin: No petechial rash or dryness.  Appeared moist.  Lab Results: Lab Results  Component Value Date   WBC 6.8 08/03/2019   HGB 13.5 08/03/2019   HCT 40.7 08/03/2019   MCV 90.4 08/03/2019   PLT 194 08/03/2019     Chemistry      Component Value Date/Time   NA 145 04/05/2019 0948   NA 134 03/11/2018 0913   K 4.4 04/05/2019 0948   CL 106 04/05/2019 0948   CO2 29 04/05/2019 0948   BUN 24 (H) 04/05/2019 0948   BUN 17 03/11/2018 0913   CREATININE 1.59 (H) 04/05/2019 0948      Component Value Date/Time   CALCIUM 9.5 04/05/2019 0948    ALKPHOS 47 04/05/2019 0948   AST 14 (L) 04/05/2019 0948   ALT 16 04/05/2019 0948   BILITOT 0.3 04/05/2019 0948      IMPRESSION:  1. Status post right nephrectomy. Stable appearance of fat necrosis in the nephrectomy bed.  2. Previously noted left hepatic lobe lesion is smaller in size.  3. Otherwise, no acute abnormalities or changes compared to the prior examination..  4. Chronic changes as above.      Impression and Plan:  74 year old man with:   1.  Renal cell carcinoma diagnosed in 2016.  He developed stage IV disease with hepatic involvement.   He is status post therapy outlined above and received a near complete response on immunotherapy.  CT scan obtained on April 13, 2019 was reviewed and showed no evidence of metastatic disease or evidence of disease progression.  His hepatic lesion appears to have regressed without any active treatment.   Risks and benefits of continued active surveillance at this time versus starting anticancer treatment were reviewed.  I have deferred treatment option at this time unless he is clear evidence of disease progression.     2.  Immune mediated complications: He developed adrenal insufficiency as well as hypothyroidism and currently on appropriate replacement hormones.  He is currently on low-dose Cortef.  3.  Weight loss: Resolved at this time.  4.  Prognosis and goals of care: His disease is potentially incurable although aggressive measures are warranted given his excellent performance status.  5.  Follow-up: He will return in 4 months for repeat evaluation and imaging studies.  30  minutes spent on this encounter.  The time was dedicated to reviewing his disease status, reviewing imaging studies and outlining future plan of care.   Zola Button, MD 6/17/202110:05 AM

## 2019-08-09 DIAGNOSIS — H26493 Other secondary cataract, bilateral: Secondary | ICD-10-CM | POA: Diagnosis not present

## 2019-08-15 ENCOUNTER — Telehealth: Payer: Self-pay

## 2019-08-15 ENCOUNTER — Other Ambulatory Visit: Payer: Self-pay | Admitting: Oncology

## 2019-08-15 DIAGNOSIS — C641 Malignant neoplasm of right kidney, except renal pelvis: Secondary | ICD-10-CM

## 2019-08-15 NOTE — Telephone Encounter (Signed)
Called patient's daughter [name] and made her aware that Dr. Alen Blew has ordered scans. She is aware to expect a call from [facility] to get scans scheduled. Also gave [name] the number to [employer].

## 2019-08-15 NOTE — Telephone Encounter (Signed)
-----   Message from Wyatt Portela, MD sent at 08/15/2019  7:39 AM EDT ----- Will arrange for scan first and pending results will determine follow up. I will order. Thanks ----- Message ----- From: Tami Lin, RN Sent: 08/14/2019   3:07 PM EDT To: Wyatt Portela, MD  Patient's daughter called and stated patient is concerned because he has been having increasing abdominal pain on the right side. Patient woke up with 8/10 pain last night. Per daughter she is not sure if patient mentioned this to you at his visit on 6/17 but patient states the pain is similar to when his cancer was diagnosed previously and has gotten worse over the last 2 weeks. Patient wants to know if can set up another visit with you and/or if his scan can be moved up sooner than October. Last scan was 12/2018.  Lanelle Bal

## 2019-08-22 ENCOUNTER — Other Ambulatory Visit: Payer: Self-pay

## 2019-08-22 ENCOUNTER — Ambulatory Visit (HOSPITAL_COMMUNITY)
Admission: RE | Admit: 2019-08-22 | Discharge: 2019-08-22 | Disposition: A | Discharge status: Home / Self Care | Payer: Medicare HMO | Source: Ambulatory Visit | Attending: Oncology | Admitting: Oncology

## 2019-08-22 ENCOUNTER — Encounter (HOSPITAL_COMMUNITY): Payer: Self-pay

## 2019-08-22 DIAGNOSIS — K5730 Diverticulosis of large intestine without perforation or abscess without bleeding: Secondary | ICD-10-CM | POA: Diagnosis not present

## 2019-08-22 DIAGNOSIS — C641 Malignant neoplasm of right kidney, except renal pelvis: Secondary | ICD-10-CM

## 2019-08-22 DIAGNOSIS — I700 Atherosclerosis of aorta: Secondary | ICD-10-CM | POA: Diagnosis not present

## 2019-08-22 DIAGNOSIS — N280 Ischemia and infarction of kidney: Secondary | ICD-10-CM | POA: Diagnosis not present

## 2019-08-22 MED ORDER — IOHEXOL 300 MG/ML  SOLN
75.0000 mL | Freq: Once | INTRAMUSCULAR | Status: AC | PRN
  Administered 2019-08-22: 75 mL via INTRAVENOUS

## 2019-08-22 MED ORDER — SODIUM CHLORIDE (PF) 0.9 % IJ SOLN
INTRAMUSCULAR | Status: AC
  Filled 2019-08-22: qty 50

## 2019-08-28 ENCOUNTER — Other Ambulatory Visit: Payer: Self-pay | Admitting: Oncology

## 2019-08-28 ENCOUNTER — Telehealth: Payer: Self-pay | Admitting: Oncology

## 2019-08-28 DIAGNOSIS — Z20822 Contact with and (suspected) exposure to covid-19: Secondary | ICD-10-CM | POA: Diagnosis not present

## 2019-08-28 DIAGNOSIS — B349 Viral infection, unspecified: Secondary | ICD-10-CM | POA: Diagnosis not present

## 2019-08-28 DIAGNOSIS — R5383 Other fatigue: Secondary | ICD-10-CM | POA: Diagnosis not present

## 2019-08-28 NOTE — Telephone Encounter (Signed)
scheduled appt per 7/12 sch msg - unable to reach pt . Left message with appt date and time

## 2019-08-28 NOTE — Progress Notes (Signed)
Results of the CT scan called into the patient today and discussed with his daughter.  He does have small enlarging retroperitoneal lymph node that is unlikely to be symptomatic or causing any significant issues.  Treatment options were reviewed potential radiation therapy to this isolated area metastasis could be used in the future becomes problematic.  I will defer the option of systemic therapy at this time.  He is dealing with the help of his wife that is currently be discharged home with hospice has a lot of issues to contend with for the time being.  We will arrange follow-up in the next few weeks to follow his progress.

## 2019-09-06 ENCOUNTER — Ambulatory Visit: Payer: Medicare HMO | Admitting: Internal Medicine

## 2019-09-28 ENCOUNTER — Encounter: Payer: Self-pay | Admitting: Oncology

## 2019-09-28 ENCOUNTER — Inpatient Hospital Stay: Payer: Medicare HMO | Attending: Oncology | Admitting: Oncology

## 2019-09-28 ENCOUNTER — Other Ambulatory Visit: Payer: Self-pay

## 2019-09-28 VITALS — BP 118/64 | HR 67 | Temp 97.8°F | Resp 18 | Ht 68.0 in | Wt 183.6 lb

## 2019-09-28 DIAGNOSIS — C787 Secondary malignant neoplasm of liver and intrahepatic bile duct: Secondary | ICD-10-CM | POA: Insufficient documentation

## 2019-09-28 DIAGNOSIS — I2510 Atherosclerotic heart disease of native coronary artery without angina pectoris: Secondary | ICD-10-CM | POA: Insufficient documentation

## 2019-09-28 DIAGNOSIS — C649 Malignant neoplasm of unspecified kidney, except renal pelvis: Secondary | ICD-10-CM | POA: Insufficient documentation

## 2019-09-28 DIAGNOSIS — Z79899 Other long term (current) drug therapy: Secondary | ICD-10-CM | POA: Insufficient documentation

## 2019-09-28 DIAGNOSIS — I700 Atherosclerosis of aorta: Secondary | ICD-10-CM | POA: Insufficient documentation

## 2019-09-28 DIAGNOSIS — E230 Hypopituitarism: Secondary | ICD-10-CM | POA: Insufficient documentation

## 2019-09-28 DIAGNOSIS — R634 Abnormal weight loss: Secondary | ICD-10-CM | POA: Insufficient documentation

## 2019-09-28 DIAGNOSIS — E2740 Unspecified adrenocortical insufficiency: Secondary | ICD-10-CM | POA: Diagnosis not present

## 2019-09-28 DIAGNOSIS — C641 Malignant neoplasm of right kidney, except renal pelvis: Secondary | ICD-10-CM

## 2019-09-28 DIAGNOSIS — Z7982 Long term (current) use of aspirin: Secondary | ICD-10-CM | POA: Diagnosis not present

## 2019-09-28 NOTE — Progress Notes (Signed)
Hematology and Oncology Follow Up Visit  [name] [id number] October 18, 1945 74 y.o. 09/28/2019 2:48 PM [name], MDElkins, [name], *   Principle Diagnosis: 74 year old man with stage IV clear cell renal cell carcinoma with hepatic metastasis and omental involvement diagnosed in 2019.  He presented with localized disease in 2016.  Prior Therapy: He underwent a radical nephrectomy completed by Dr. Louis Meckel on February 01, 2015.  The final pathology revealed a 3.3 cm clear cell renal cell carcinoma with Fuhrman grade 3.  Margins were negative indicating stage T1 a disease  He is status post omental biopsy in July 2019 which confirmed the presence of recurrent metastatic renal cell carcinoma.  Nivolumab at 3 mg/mg and ipilimumab at 1 mg/kg cycle 1 started on 09/21/2017.  He completed 4 cycles of therapy.   He did not receive any additional immunotherapy after developing adrenal insufficiency and panhypopituitarism.   Current therapy: Active surveillance.  Interim History: [name] returns today for a repeat evaluation.  Since the last visit, he reports no major changes in his health.  He does report intermittent right-sided flank pain although not persistent or not interfering with his function.  He denies any nausea vomiting or abdominal pain.  He denied any recent hospitalization or illnesses.  As performance status quality of life remains excellent.            .      Medications: Unchanged on review. Current Outpatient Medications  Medication Sig Dispense Refill  . acetaminophen (TYLENOL) 325 MG tablet Take 2 tablets (650 mg total) by mouth every 6 (six) hours as needed for mild pain or headache (over the counter). 30 tablet 3  . aspirin 81 MG chewable tablet Chew 81 mg by mouth daily.    Marland Kitchen diltiazem (CARDIZEM CD) 120 MG 24 hr capsule     . famotidine (PEPCID) 20 MG tablet Take 20 mg by mouth 2 (two) times daily.    . hydrocortisone (CORTEF) 5 MG tablet Take 2  tablets in am and 1 tablet in pm, ~ 2pm 300 tablet 3  . levothyroxine (SYNTHROID) 150 MCG tablet Take 1 tablet (150 mcg total) by mouth daily. 90 tablet 3  . Multiple Vitamin (MULTIVITAMIN WITH MINERALS) TABS tablet Take 1 tablet by mouth daily. 30 tablet 3  . temazepam (RESTORIL) 7.5 MG capsule Take 1 capsule (7.5 mg total) by mouth at bedtime as needed for sleep. 30 capsule 0   No current facility-administered medications for this visit.     Allergies:  Allergies  Allergen Reactions  . Benadryl [Diphenhydramine] Other (See Comments)    Severe restless legs        Physical Exam:    Blood pressure 118/64, pulse 67, temperature 97.8 F (36.6 C), temperature source Temporal, resp. rate 18, height 5\' 8"  (1.727 m), weight 183 lb 9.6 oz (83.3 kg), SpO2 98 %.      ECOG: 1    General appearance: Alert, awake without any distress. Head: Atraumatic without abnormalities Oropharynx: Without any thrush or ulcers. Eyes: No scleral icterus. Lymph nodes: No lymphadenopathy noted in the cervical, supraclavicular, or axillary nodes Heart:regular rate and rhythm, without any murmurs or gallops.   Lung: Clear to auscultation without any rhonchi, wheezes or dullness to percussion. Abdomin: Soft, nontender without any shifting dullness or ascites. Musculoskeletal: No clubbing or cyanosis. Neurological: No motor or sensory deficits. Skin: No rashes or lesions.           Lab Results: Lab Results  Component Value  Date   WBC 6.8 08/03/2019   HGB 13.5 08/03/2019   HCT 40.7 08/03/2019   MCV 90.4 08/03/2019   PLT 194 08/03/2019     Chemistry      Component Value Date/Time   NA 143 08/03/2019 0954   NA 134 03/11/2018 0913   K 4.4 08/03/2019 0954   CL 105 08/03/2019 0954   CO2 28 08/03/2019 0954   BUN 22 08/03/2019 0954   BUN 17 03/11/2018 0913   CREATININE 1.54 (H) 08/03/2019 0954      Component Value Date/Time   CALCIUM 9.5 08/03/2019 0954   ALKPHOS 62 08/03/2019  0954   AST 12 (L) 08/03/2019 0954   ALT 12 08/03/2019 0954   BILITOT 0.4 08/03/2019 0954      IMPRESSION: 1. Significant enlargement of hypodense, rim enhancing right retroperitoneal lymph nodes, the largest aortocaval node closely abutting the pancreatic head measuring 2.5 x 2.0 cm. Findings are consistent with worsened nodal metastatic disease. 2. Unchanged irregular omental nodule of the anterior left hemiabdomen measuring approximately 1.2 cm. Attention on follow-up. 3. Status post right nephrectomy with unchanged appearance of fat necrosis in the nephrectomy bed. 4. Partially imaged postoperative findings of right middle and lower lobectomy in the lower chest. 5. Coronary artery disease.  Aortic Atherosclerosis (ICD10-I70.0).    Impression and Plan:  74 year old man with:   1.  Clear-cell renal cell carcinoma diagnosed in 2016.  He subsequently found to have stage IV disease in 2019.   CT scan obtained on August 22, 2019 was personally reviewed and discussed again with patient.  He did have enlargement of a right retroperitoneal lymph node with the largest aortocaval lymph node measuring 2.5 x 2.0 cm.  These are finding consistent with metastatic disease and treatment options were discussed.  Systemic therapy utilizing oral targeted therapy, immunotherapy or combination of both were discussed.  Local therapy with radiation was also entertained given the limited metastatic disease noted at this time.  After discussion today, he is agreeable to consider radiation at this time.    2.  Immune mediated complications: He developed hypophysitis and currently on hormone replacement.  3.  Weight loss: His weight is down few pounds but overall appetite is reasonable.  4.  Prognosis and goals of care: Therapy remains palliative although aggressive measures are warranted given his excellent performance status and limited disease.  5.  Follow-up: Will be in the next 3 months to follow  his status.  30  minutes were dedicated to this visit.  The time was spent on reviewing imaging studies, treatment options and outlining future plan of care.   Zola Button, MD 8/12/20212:48 PM

## 2019-10-02 ENCOUNTER — Telehealth: Payer: Self-pay | Admitting: Oncology

## 2019-10-02 NOTE — Telephone Encounter (Signed)
Scheduled per 08/12 los, spoke with patient's relative and patient will be notified of upcoming appointments.

## 2019-10-09 DIAGNOSIS — R079 Chest pain, unspecified: Secondary | ICD-10-CM | POA: Diagnosis not present

## 2019-10-13 NOTE — Progress Notes (Signed)
Patient here for a consult with Dr. Sondra Come.  [name] [id number] 16-Aug-1945 74 y.o. 09/28/2019 2:48 PM [name], MDElkins, [name], *   Principle Diagnosis: 74 year old man with stage IV clear cell renal cell carcinoma with hepatic metastasis and omental involvement diagnosed in 2019.  He presented with localized disease in 2016.  Prior Therapy: He underwent a radical nephrectomy completed by Dr. Louis Meckel on February 01, 2015. The final pathology revealed a 3.3 cm clear cell renal cell carcinoma with Fuhrman grade 3. Margins were negative indicating stage T1 a disease  He is status post omental biopsy in July 2019 which confirmed the presence of recurrent metastatic renal cell carcinoma.  Nivolumab at 3 mg/mg and ipilimumab at 1 mg/kg cycle 1 started on 09/21/2017.  He completed 4 cycles of therapy.   He did not receive any additional immunotherapy after developing adrenal insufficiency and panhypopituitarism.   Current therapy: Active surveillance.  Interim History: [name] returns today for a repeat evaluation.  Since the last visit, he reports no major changes in his health.  He does report intermittent right-sided flank pain although not persistent or not interfering with his function.  He denies any nausea vomiting or abdominal pain.  He denied any recent hospitalization or illnesses.  As performance status quality of life remains excellent.  IMPRESSION: 1. Significant enlargement of hypodense, rim enhancing right retroperitoneal lymph nodes, the largest aortocaval node closely abutting the pancreatic head measuring 2.5 x 2.0 cm. Findings are consistent with worsened nodal metastatic disease. 2. Unchanged irregular omental nodule of the anterior left hemiabdomen measuring approximately 1.2 cm. Attention on follow-up. 3. Status post right nephrectomy with unchanged appearance of fat necrosis in the nephrectomy bed. 4. Partially imaged postoperative findings  of right middle and lower lobectomy in the lower chest. 5. Coronary artery disease. Aortic Atherosclerosis (ICD10-I70.0).  Dr. Alen Blew  Past/Anticipated interventions by medical oncology, if any: had infusions in the past.  Weight changes, if any: no  Bowel/Bladder complaints, if any:no  Nausea/Vomiting, if any: no  Pain issues, if any:  Daily pain at right flank  SAFETY ISSUES:  Prior radiation? yes  Pacemaker/ICD? no  Possible current pregnancy? n/a  Is the patient on methotrexate? no  Current Complaints / other details: accompanied by his daughter. Has staples in his right lung. This is his 4 th Cancer dx.  BP 124/76 (BP Location: Left Arm, Patient Position: Sitting, Cuff Size: Normal)   Pulse 68   Temp 98.1 F (36.7 C)   Resp 18   Ht 5\' 8"  (1.727 m)   Wt 180 lb 6.4 oz (81.8 kg)   SpO2 98%   BMI 27.43 kg/m   Wt Readings from Last 3 Encounters:  10/16/19 180 lb 6.4 oz (81.8 kg)  09/28/19 183 lb 9.6 oz (83.3 kg)  08/03/19 188 lb 4.8 oz (85.4 kg)     wt

## 2019-10-15 NOTE — Progress Notes (Signed)
Radiation Oncology         [phone] ________________________________  Initial Outpatient Consultation  Name: [name] MRN: [id number]  Date: 10/16/2019  DOB: Oct 02, 1945  GY:KZLDJT, [name], MD  Wyatt Portela, MD   REFERRING PHYSICIAN: Wyatt Portela, MD  DIAGNOSIS: The primary encounter diagnosis was Primary malignant neoplasm of right kidney with metastasis from kidney to other site [facility]). A diagnosis of Clear cell carcinoma of left kidney [facility]) was also pertinent to this visit.  Stage IV clear cell renal cell carcinoma with hepatic metastasis and omental involvement  HISTORY OF PRESENT ILLNESS::[name] is a 74 y.o. male who is seen as a courtesy of Dr. Alen Blew for an opinion concerning radiation therapy as part of management for his metastatic clear cell renal cell carcinoma. Today, he is accompanied by daughter. The patient underwent an abdominal CT scan on 12/28/2014 for evaluation of left lower quadrant abdominal pain. Results showed an enhancing right renal mass. Given that finding, the patient underwent a robotic-assisted laparoscopic radical nephrectomy on 02/01/2015 that was performed by Dr. Louis Meckel. Pathology from the procedure revealed grade 3 clear cell renal cell carcinoma with negative margins, indicating stage T1a disease.  In May of 2019, the patient began to develop epigastric abdominal pain. Thus, he underwent a CT scan of the abdomen and pelvis on 06/18/20219, which showed new left lower lobe and lingular pulmonary modules that were consistent with metastases. There was concern for tumor recurrence versus metastatic lymph node adjacent to IVC with evidence of IVC invasion. Additionally, it showed peritoneal metastasis of the the left anterior mid abdomen, distal colonic diverticulosis, a stable non-specific anterior left lobe liver lesion, a new hypervascular lesion at the posterior right lobe liver that measured 3.1 x 3.2 x  3.7 cm, and a hypervascular 10 mm  lateral right lobe liver nodule that was highly concerning for hepatic metastases.  Given the above findings, the patient was referred to Dr. Alen Blew and was seen in consultation on 09/02/2017. With the multiple locations of the tumors, primary surgical intervention were not considered. However, they did discuss systemic chemotherapy with either oral targeted therapy or IV immunotherapy.  The patient underwent a CT-guided biopsy of the omental mass on 09/07/2017 that was performed by Dr. Annamaria Boots. Pathology from the procedure revealed grade 2-3 metastatic clear cell renal cell carcinoma.   Chest CT scan performed on 09/09/2017 showed left lower lobe and lingular nodules that were most consistent with metastatic disease. There was no thoracic adenopathy. The upper abdominal recurrent/metastatic disease was suboptimally evaluated.  Given the aggressive nature of the patient's disease with hepatic metastasis, Dr. Alen Blew recommended that he undergo systemic chemotherapy using combined immunotherapy agents (Ipilimumab and Nivolumab). He underwent four cycles from 09/21/2017 to 11/24/2017. He tolerated treatment relatively well with the exception of grade 1 dermatological toxicities. This was followed by Nivolumab maintenance beginning on 12/15/2017.  CT scan of chest, abdomen, and pelvis on 12/14/2017 showed improving left lung metastases with a residual 8 mm nodule in the left lower lobe. It also showed improving hepatic metastasis along the posterior aspect of segment 6 that then measured 1.3 cm. Additionally, there was improvement of the upper abdominal nodule metastases including an 11 mm short axis retrocaval node. Finally, there was improvement in the peritoneal implant beneath the left mid abdominal wall that then measured 1.2 x 1.9 cm.  Of note, the patient was scheduled to undergo a brain MRI on 01/19/2018 but refused it secondary to history of feeling overheated in  the 1990s when he last attempted to  undergo an MRI.  Nivolumab was discontinued on 02/10/2018. CT scan of chest, abdomen, and pelvis on 03/04/2018 showed general improvement with reduction in size of the left omental tumor implants and of the retrocaval lymph nod. The 8 mm nodule in the left lower lobe was stable. The small metastatic lesions posteriorly in segment 6 of the liver was only faintly seen and mildly improved in size. There were no new metastatic lesions identified. However, there was a new small pericardial effusion that was of uncertain significance.   Of note, the patient was admitted to the hospital on 03/13/2018 and 03/24/2018 for generalized weakness, hyponatremia, and dehydration. The etiology of his symptoms were unclear and it was considered that they may have been delayed toxicities to immunootherapy. Given those complications, it was recommended that he use oral targeted therapy such as Axitinib instead of immune therapy.  CT scan of chest, abdomen, and pelvis on 05/17/2018 showed continued reduction in the left lower lobe pulmonary nodule. The small subcapsular posterior right liver hypodense mass was stable. Additionally, there was continued reduction in the left omental soft tissue metastasis. The retrocaval and porta hepatis adenopathy was stable. The pericardial effusion had slightly decreased in size. There was no new or progressive metastatic disease. No additional therapy was needed at that time and the patient remained on active surveillance.  CT scan of chest on 08/30/2018 showed a new 8 mm subpleural medial left lower lobe solid pulmonary nodule. New pulmonary metastasis could not be excluded. The previously visualized 6 mm left lower lobe solid pulmonary nodule was stable. There was no adenopathy or other potential findings of metastatic disease in the chest. The patient remained under active surveillance with repeat imaging.   He did not receive any additional immunotherapy after developing adrenal  insufficiency and panhypopituitarism.  He remains on hormone replacement for immune mediated complications.  CT scan of chest, abdomen, and pelvis on 01/11/2019 showed borderline enlarged periportal, pattern duodenal, and retroperitoneal lymph nodes that were stable. The subpleural nodule in the medial left lower lobe had resolved. The left omental nodule was stable. The patient remained on active surveillance without convincing evidence of progression of disease.  Most recently, CT scan of abdomen and pelvis on 08/22/2019 showed significant enlargement of hypodense, rim enhancing right retroperitoneal lymph node. The largest aortocaval node closely abutted the pancreatic head and measured 2.5 x 2.0 cm. Findings were consistent with worsened nodal metastatic disease. The irregular omental nodule of the anterior left hemi-abdomen that measured approximately 1.2 cm was unchanged. Finally, there was an unchanged appearance of fat necrosis in the right nephrectomy bed.  The patient was last seen by Dr. Alen Blew on 09/28/2019. Given the above finding, they discussed systemic therapy utilizing oral targeted therapy, immunotherapy, or a combination of both. Local therapy with radiation was also discussed given the limited metastatic disease noted at that time.  Of note, the patient has a remote history of right lung cancer status post right lower lobectomy in 1993.  PREVIOUS RADIATION THERAPY: No  PAST MEDICAL HISTORY:  Past Medical History:  Diagnosis Date  . Arthritis   . Chronic kidney disease    only has one kidney   . 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 02/01/2015  . Coronary artery disease    followed by Dr.Tilley  . GERD (gastroesophageal reflux disease)   . Heart murmur   . High cholesterol   . Hx of cancer of lung  1980's  . Hypertension   . Incisional hernia 08/01/2015  . Lung cancer ([place]) 1993  . Lung metastases ([place]) 2019  . Recurrent umbilical hernia  05/12/7122  . Right renal mass     PAST SURGICAL HISTORY: Past Surgical History:  Procedure Laterality Date  . APPENDECTOMY  age 58  . CHOLECYSTECTOMY  1980's  . LAPAROSCOPIC LYSIS OF ADHESIONS N/A 08/01/2015   Procedure: LAPAROSCOPIC LYSIS OF ADHESIONS;  Surgeon: Michael Boston, MD;  Location: [facility];  Service: General;  Laterality: N/A;  . LUNG LOBECTOMY  1992   lung cancer- patient has staples in lung not to have MRI per patient   . PILONIDAL CYST EXCISION  2011   Dr Harlow Asa  . ROBOTIC ASSITED PARTIAL NEPHRECTOMY Right 02/01/2015   Procedure: RIGHT ROBOTIC ASSISTED LAPAROSOCOPY NEPHRECTOMY;  Surgeon: Ardis Hughs, MD;  Location: [facility];  Service: Urology;  Laterality: Right;  . VENTRAL HERNIA REPAIR N/A 08/01/2015   Procedure: LAPAROSCOPIC VENTRAL WALL HERNIA WITH MESH;  Surgeon: Michael Boston, MD;  Location: [facility];  Service: General;  Laterality: N/A;    FAMILY HISTORY:  Family History  Problem Relation Age of Onset  . Heart attack Father     SOCIAL HISTORY:  Social History   Tobacco Use  . Smoking status: Former Smoker    Packs/day: 1.00    Years: 20.00    Pack years: 20.00    Quit date: 01/29/1988    Years since quitting: 31.7  . Smokeless tobacco: Never Used  Substance Use Topics  . Alcohol use: Yes    Comment: rare  . Drug use: No    ALLERGIES:  Allergies  Allergen Reactions  . Benadryl [Diphenhydramine] Other (See Comments)    Severe restless legs    MEDICATIONS:  Current Outpatient Medications  Medication Sig Dispense Refill  . acetaminophen (TYLENOL) 325 MG tablet Take 2 tablets (650 mg total) by mouth every 6 (six) hours as needed for mild pain or headache (over the counter). 30 tablet 3  . aspirin 81 MG chewable tablet Chew 81 mg by mouth daily.    Marland Kitchen diltiazem (CARDIZEM CD) 120 MG 24 hr capsule     . famotidine (PEPCID) 20 MG tablet Take 20 mg by mouth 2 (two) times daily.    . hydrocortisone (CORTEF) 5 MG tablet Take 2 tablets in am and 1 tablet  in pm, ~ 2pm 300 tablet 3  . levothyroxine (SYNTHROID) 150 MCG tablet Take 1 tablet (150 mcg total) by mouth daily. 90 tablet 3  . Multiple Vitamin (MULTIVITAMIN WITH MINERALS) TABS tablet Take 1 tablet by mouth daily. 30 tablet 3  . temazepam (RESTORIL) 7.5 MG capsule Take 1 capsule (7.5 mg total) by mouth at bedtime as needed for sleep. 30 capsule 0   No current facility-administered medications for this encounter.    REVIEW OF SYSTEMS:  A 10+ POINT REVIEW OF SYSTEMS WAS OBTAINED including neurology, dermatology, psychiatry, cardiac, respiratory, lymph, extremities, GI, GU, musculoskeletal, constitutional, reproductive, HEENT.  Patient complains of some right flank and upper abdominal pain which has been present for the past 3 to 4 months.  He however does not need to take any pain medication for this discomfort   PHYSICAL EXAM:  height is 5\' 8"  (1.727 m) and weight is 180 lb 6.4 oz (81.8 kg). His temperature is 98.1 F (36.7 C). His blood pressure is 124/76 and his pulse is 68. His respiration is 18 and oxygen saturation is 98%.   General: Alert and oriented,  in no acute distress, somewhat hard of hearing HEENT: Head is normocephalic. Extraocular movements are intact.  Neck: Neck is supple, no palpable cervical or supraclavicular lymphadenopathy. Heart: Regular in rate and rhythm with no murmurs, rubs, or gallops. Chest: Clear to auscultation bilaterally, with no rhonchi, wheezes, or rales.  Patient has a faint scar on the right lower chest from his prior thoracotomy. Abdomen: Soft, nontender, nondistended, with no rigidity or guarding.  Patient has scars along the flank area and abdominal area from his nephrectomy Extremities: No cyanosis or edema. Lymphatics: see Neck Exam Skin: No concerning lesions. Musculoskeletal: symmetric strength and muscle tone throughout. Neurologic: Cranial nerves II through XII are grossly intact. No obvious focalities. Speech is fluent. Coordination is  intact. Psychiatric: Judgment and insight are intact. Affect is appropriate.   ECOG = 1  0 - Asymptomatic (Fully active, able to carry on all predisease activities without restriction)  1 - Symptomatic but completely ambulatory (Restricted in physically strenuous activity but ambulatory and able to carry out work of a light or sedentary nature. For example, light housework, office work)  2 - Symptomatic, <50% in bed during the day (Ambulatory and capable of all self care but unable to carry out any work activities. Up and about more than 50% of waking hours)  3 - Symptomatic, >50% in bed, but not bedbound (Capable of only limited self-care, confined to bed or chair 50% or more of waking hours)  4 - Bedbound (Completely disabled. Cannot carry on any self-care. Totally confined to bed or chair)  5 - Death   Eustace Pen MM, Creech RH, Tormey DC, et al. [phone]). "Toxicity and response criteria of the [facility]". [place] Oncol. 5 (6): 649-55  LABORATORY DATA:  Lab Results  Component Value Date   WBC 6.8 08/03/2019   HGB 13.5 08/03/2019   HCT 40.7 08/03/2019   MCV 90.4 08/03/2019   PLT 194 08/03/2019   NEUTROABS 3.4 08/03/2019   Lab Results  Component Value Date   NA 143 08/03/2019   K 4.4 08/03/2019   CL 105 08/03/2019   CO2 28 08/03/2019   GLUCOSE 96 08/03/2019   CREATININE 1.54 (H) 08/03/2019   CALCIUM 9.5 08/03/2019      RADIOGRAPHY: No results found.    IMPRESSION: Stage IV clear cell renal cell carcinoma with hepatic metastasis and omental involvement  Recent scans show significant progression in the retroperitoneal area compared to scans 6 months ago.  I reviewed the imaging studies with the patient and his daughter highlighting the relevant CT scan images.  We discussed options for treatment including local radiation therapy versus additional immunotherapy or possibly targeted therapy.  Given his previous issues with immunotherapy he would prefer  to proceed with radiation therapy.  Patient understands that he will likely show up at a later date with new areas but at the current time,  the retroperitoneal node region is the only active site of disease.   Today, I talked to the patient and daughter about the findings and work-up thus far.  We discussed the natural history of clear cell renal cell carcinoma and general treatment, highlighting the role of radiotherapy in the management.  We discussed the available radiation techniques, and focused on the details of logistics and delivery.  We reviewed the anticipated acute and late sequelae associated with radiation in this setting.  The patient was encouraged to ask questions that I answered to the best of my ability.  A patient consent form  was discussed and signed.  We retained a copy for our records.  The patient would like to proceed with radiation and will be scheduled for CT simulation.  PLAN: Patient will return on October 19, 2019 for CT simulation with treatments to begin approximately a week later.  Anticipate between 10 and 15 treatments.  Total time spent in this encounter was 60 minutes which included reviewing the patient's most recent CT scans, hospitalizations, consultations, follow-ups, biopsies, patholory reports, chemotherapy, physical examination, and documentation.  ------------------------------------------------  Blair Promise, PhD, MD  This document serves as a record of services personally performed by Gery Pray, MD. It was created on his behalf by Clerance Lav, a trained medical scribe. The creation of this record is based on the scribe's personal observations and the provider's statements to them. This document has been checked and approved by the attending provider.

## 2019-10-16 ENCOUNTER — Encounter: Payer: Self-pay | Admitting: Radiation Oncology

## 2019-10-16 ENCOUNTER — Other Ambulatory Visit: Payer: Self-pay

## 2019-10-16 ENCOUNTER — Ambulatory Visit
Admission: RE | Admit: 2019-10-16 | Discharge: 2019-10-16 | Disposition: A | Discharge status: Home / Self Care | Payer: Medicare HMO | Source: Ambulatory Visit | Attending: Radiation Oncology | Admitting: Radiation Oncology

## 2019-10-16 VITALS — BP 124/76 | HR 68 | Temp 98.1°F | Resp 18 | Ht 68.0 in | Wt 180.4 lb

## 2019-10-16 DIAGNOSIS — K449 Diaphragmatic hernia without obstruction or gangrene: Secondary | ICD-10-CM | POA: Diagnosis not present

## 2019-10-16 DIAGNOSIS — Z905 Acquired absence of kidney: Secondary | ICD-10-CM | POA: Diagnosis not present

## 2019-10-16 DIAGNOSIS — C642 Malignant neoplasm of left kidney, except renal pelvis: Secondary | ICD-10-CM

## 2019-10-16 DIAGNOSIS — C641 Malignant neoplasm of right kidney, except renal pelvis: Secondary | ICD-10-CM | POA: Insufficient documentation

## 2019-10-16 DIAGNOSIS — Z79899 Other long term (current) drug therapy: Secondary | ICD-10-CM | POA: Diagnosis not present

## 2019-10-16 DIAGNOSIS — C772 Secondary and unspecified malignant neoplasm of intra-abdominal lymph nodes: Secondary | ICD-10-CM | POA: Diagnosis not present

## 2019-10-16 DIAGNOSIS — E2740 Unspecified adrenocortical insufficiency: Secondary | ICD-10-CM | POA: Diagnosis not present

## 2019-10-16 DIAGNOSIS — Z923 Personal history of irradiation: Secondary | ICD-10-CM | POA: Diagnosis not present

## 2019-10-16 DIAGNOSIS — E230 Hypopituitarism: Secondary | ICD-10-CM | POA: Diagnosis not present

## 2019-10-16 DIAGNOSIS — K219 Gastro-esophageal reflux disease without esophagitis: Secondary | ICD-10-CM | POA: Diagnosis not present

## 2019-10-16 DIAGNOSIS — Z87891 Personal history of nicotine dependence: Secondary | ICD-10-CM | POA: Insufficient documentation

## 2019-10-16 DIAGNOSIS — C787 Secondary malignant neoplasm of liver and intrahepatic bile duct: Secondary | ICD-10-CM | POA: Diagnosis not present

## 2019-10-16 DIAGNOSIS — I10 Essential (primary) hypertension: Secondary | ICD-10-CM | POA: Insufficient documentation

## 2019-10-16 DIAGNOSIS — Z85118 Personal history of other malignant neoplasm of bronchus and lung: Secondary | ICD-10-CM | POA: Insufficient documentation

## 2019-10-16 DIAGNOSIS — I2510 Atherosclerotic heart disease of native coronary artery without angina pectoris: Secondary | ICD-10-CM | POA: Insufficient documentation

## 2019-10-16 DIAGNOSIS — Z85528 Personal history of other malignant neoplasm of kidney: Secondary | ICD-10-CM | POA: Diagnosis not present

## 2019-10-16 DIAGNOSIS — C786 Secondary malignant neoplasm of retroperitoneum and peritoneum: Secondary | ICD-10-CM | POA: Insufficient documentation

## 2019-10-16 DIAGNOSIS — I313 Pericardial effusion (noninflammatory): Secondary | ICD-10-CM | POA: Diagnosis not present

## 2019-10-19 ENCOUNTER — Ambulatory Visit
Admission: RE | Admit: 2019-10-19 | Discharge: 2019-10-19 | Disposition: A | Discharge status: Home / Self Care | Payer: Medicare HMO | Source: Ambulatory Visit | Attending: Radiation Oncology | Admitting: Radiation Oncology

## 2019-10-19 ENCOUNTER — Other Ambulatory Visit: Payer: Self-pay

## 2019-10-19 DIAGNOSIS — C772 Secondary and unspecified malignant neoplasm of intra-abdominal lymph nodes: Secondary | ICD-10-CM | POA: Diagnosis not present

## 2019-10-19 DIAGNOSIS — C641 Malignant neoplasm of right kidney, except renal pelvis: Secondary | ICD-10-CM | POA: Insufficient documentation

## 2019-10-19 DIAGNOSIS — Z510 Encounter for antineoplastic radiation therapy: Secondary | ICD-10-CM | POA: Insufficient documentation

## 2019-10-23 DIAGNOSIS — Z510 Encounter for antineoplastic radiation therapy: Secondary | ICD-10-CM | POA: Diagnosis not present

## 2019-10-23 DIAGNOSIS — C772 Secondary and unspecified malignant neoplasm of intra-abdominal lymph nodes: Secondary | ICD-10-CM | POA: Diagnosis not present

## 2019-10-23 DIAGNOSIS — C641 Malignant neoplasm of right kidney, except renal pelvis: Secondary | ICD-10-CM | POA: Diagnosis not present

## 2019-10-26 ENCOUNTER — Ambulatory Visit
Admission: RE | Admit: 2019-10-26 | Discharge: 2019-10-26 | Disposition: A | Discharge status: Home / Self Care | Payer: Medicare HMO | Source: Ambulatory Visit | Attending: Radiation Oncology | Admitting: Radiation Oncology

## 2019-10-26 ENCOUNTER — Other Ambulatory Visit: Payer: Self-pay

## 2019-10-26 DIAGNOSIS — C772 Secondary and unspecified malignant neoplasm of intra-abdominal lymph nodes: Secondary | ICD-10-CM | POA: Diagnosis not present

## 2019-10-26 DIAGNOSIS — C641 Malignant neoplasm of right kidney, except renal pelvis: Secondary | ICD-10-CM | POA: Diagnosis not present

## 2019-10-26 DIAGNOSIS — Z510 Encounter for antineoplastic radiation therapy: Secondary | ICD-10-CM | POA: Diagnosis not present

## 2019-10-27 ENCOUNTER — Other Ambulatory Visit: Payer: Self-pay

## 2019-10-27 ENCOUNTER — Ambulatory Visit
Admission: RE | Admit: 2019-10-27 | Discharge: 2019-10-27 | Disposition: A | Discharge status: Home / Self Care | Payer: Medicare HMO | Source: Ambulatory Visit | Attending: Radiation Oncology | Admitting: Radiation Oncology

## 2019-10-27 DIAGNOSIS — C641 Malignant neoplasm of right kidney, except renal pelvis: Secondary | ICD-10-CM | POA: Diagnosis not present

## 2019-10-27 DIAGNOSIS — C772 Secondary and unspecified malignant neoplasm of intra-abdominal lymph nodes: Secondary | ICD-10-CM | POA: Diagnosis not present

## 2019-10-27 DIAGNOSIS — Z510 Encounter for antineoplastic radiation therapy: Secondary | ICD-10-CM | POA: Diagnosis not present

## 2019-10-30 ENCOUNTER — Ambulatory Visit
Admission: RE | Admit: 2019-10-30 | Discharge: 2019-10-30 | Disposition: A | Discharge status: Home / Self Care | Payer: Medicare HMO | Source: Ambulatory Visit | Attending: Radiation Oncology | Admitting: Radiation Oncology

## 2019-10-30 ENCOUNTER — Other Ambulatory Visit: Payer: Self-pay

## 2019-10-30 DIAGNOSIS — Z510 Encounter for antineoplastic radiation therapy: Secondary | ICD-10-CM | POA: Diagnosis not present

## 2019-10-30 DIAGNOSIS — C641 Malignant neoplasm of right kidney, except renal pelvis: Secondary | ICD-10-CM | POA: Diagnosis not present

## 2019-10-30 DIAGNOSIS — C772 Secondary and unspecified malignant neoplasm of intra-abdominal lymph nodes: Secondary | ICD-10-CM | POA: Diagnosis not present

## 2019-10-31 ENCOUNTER — Ambulatory Visit
Admission: RE | Admit: 2019-10-31 | Discharge: 2019-10-31 | Disposition: A | Discharge status: Home / Self Care | Payer: Medicare HMO | Source: Ambulatory Visit | Attending: Radiation Oncology | Admitting: Radiation Oncology

## 2019-10-31 ENCOUNTER — Other Ambulatory Visit: Payer: Self-pay

## 2019-10-31 DIAGNOSIS — C641 Malignant neoplasm of right kidney, except renal pelvis: Secondary | ICD-10-CM | POA: Diagnosis not present

## 2019-10-31 DIAGNOSIS — C772 Secondary and unspecified malignant neoplasm of intra-abdominal lymph nodes: Secondary | ICD-10-CM | POA: Diagnosis not present

## 2019-10-31 DIAGNOSIS — Z510 Encounter for antineoplastic radiation therapy: Secondary | ICD-10-CM | POA: Diagnosis not present

## 2019-11-01 ENCOUNTER — Other Ambulatory Visit: Payer: Self-pay

## 2019-11-01 ENCOUNTER — Ambulatory Visit
Admission: RE | Admit: 2019-11-01 | Discharge: 2019-11-01 | Disposition: A | Discharge status: Home / Self Care | Payer: Medicare HMO | Source: Ambulatory Visit | Attending: Radiation Oncology | Admitting: Radiation Oncology

## 2019-11-01 DIAGNOSIS — Z510 Encounter for antineoplastic radiation therapy: Secondary | ICD-10-CM | POA: Diagnosis not present

## 2019-11-01 DIAGNOSIS — C641 Malignant neoplasm of right kidney, except renal pelvis: Secondary | ICD-10-CM | POA: Diagnosis not present

## 2019-11-01 DIAGNOSIS — C772 Secondary and unspecified malignant neoplasm of intra-abdominal lymph nodes: Secondary | ICD-10-CM | POA: Diagnosis not present

## 2019-11-02 ENCOUNTER — Ambulatory Visit
Admission: RE | Admit: 2019-11-02 | Discharge: 2019-11-02 | Disposition: A | Discharge status: Home / Self Care | Payer: Medicare HMO | Source: Ambulatory Visit | Attending: Radiation Oncology | Admitting: Radiation Oncology

## 2019-11-02 ENCOUNTER — Other Ambulatory Visit: Payer: Self-pay

## 2019-11-02 DIAGNOSIS — Z510 Encounter for antineoplastic radiation therapy: Secondary | ICD-10-CM | POA: Diagnosis not present

## 2019-11-02 DIAGNOSIS — C772 Secondary and unspecified malignant neoplasm of intra-abdominal lymph nodes: Secondary | ICD-10-CM | POA: Diagnosis not present

## 2019-11-02 DIAGNOSIS — C641 Malignant neoplasm of right kidney, except renal pelvis: Secondary | ICD-10-CM | POA: Diagnosis not present

## 2019-11-03 ENCOUNTER — Other Ambulatory Visit: Payer: Self-pay

## 2019-11-03 ENCOUNTER — Ambulatory Visit
Admission: RE | Admit: 2019-11-03 | Discharge: 2019-11-03 | Disposition: A | Discharge status: Home / Self Care | Payer: Medicare HMO | Source: Ambulatory Visit | Attending: Radiation Oncology | Admitting: Radiation Oncology

## 2019-11-03 DIAGNOSIS — Z510 Encounter for antineoplastic radiation therapy: Secondary | ICD-10-CM | POA: Diagnosis not present

## 2019-11-03 DIAGNOSIS — C641 Malignant neoplasm of right kidney, except renal pelvis: Secondary | ICD-10-CM | POA: Diagnosis not present

## 2019-11-03 DIAGNOSIS — C772 Secondary and unspecified malignant neoplasm of intra-abdominal lymph nodes: Secondary | ICD-10-CM | POA: Diagnosis not present

## 2019-11-06 ENCOUNTER — Other Ambulatory Visit: Payer: Self-pay

## 2019-11-06 ENCOUNTER — Ambulatory Visit
Admission: RE | Admit: 2019-11-06 | Discharge: 2019-11-06 | Disposition: A | Discharge status: Home / Self Care | Payer: Medicare HMO | Source: Ambulatory Visit | Attending: Radiation Oncology | Admitting: Radiation Oncology

## 2019-11-06 DIAGNOSIS — Z510 Encounter for antineoplastic radiation therapy: Secondary | ICD-10-CM | POA: Diagnosis not present

## 2019-11-06 DIAGNOSIS — C641 Malignant neoplasm of right kidney, except renal pelvis: Secondary | ICD-10-CM | POA: Diagnosis not present

## 2019-11-06 DIAGNOSIS — C772 Secondary and unspecified malignant neoplasm of intra-abdominal lymph nodes: Secondary | ICD-10-CM | POA: Diagnosis not present

## 2019-11-07 ENCOUNTER — Ambulatory Visit
Admission: RE | Admit: 2019-11-07 | Discharge: 2019-11-07 | Disposition: A | Discharge status: Home / Self Care | Payer: Medicare HMO | Source: Ambulatory Visit | Attending: Radiation Oncology | Admitting: Radiation Oncology

## 2019-11-07 DIAGNOSIS — C641 Malignant neoplasm of right kidney, except renal pelvis: Secondary | ICD-10-CM | POA: Diagnosis not present

## 2019-11-07 DIAGNOSIS — Z510 Encounter for antineoplastic radiation therapy: Secondary | ICD-10-CM | POA: Diagnosis not present

## 2019-11-07 DIAGNOSIS — C772 Secondary and unspecified malignant neoplasm of intra-abdominal lymph nodes: Secondary | ICD-10-CM | POA: Diagnosis not present

## 2019-11-08 ENCOUNTER — Ambulatory Visit
Admission: RE | Admit: 2019-11-08 | Discharge: 2019-11-08 | Disposition: A | Discharge status: Home / Self Care | Payer: Medicare HMO | Source: Ambulatory Visit | Attending: Radiation Oncology | Admitting: Radiation Oncology

## 2019-11-08 ENCOUNTER — Other Ambulatory Visit: Payer: Self-pay

## 2019-11-08 ENCOUNTER — Encounter: Payer: Self-pay | Admitting: Radiation Oncology

## 2019-11-08 DIAGNOSIS — Z510 Encounter for antineoplastic radiation therapy: Secondary | ICD-10-CM | POA: Diagnosis not present

## 2019-11-08 DIAGNOSIS — C772 Secondary and unspecified malignant neoplasm of intra-abdominal lymph nodes: Secondary | ICD-10-CM | POA: Diagnosis not present

## 2019-11-08 DIAGNOSIS — C641 Malignant neoplasm of right kidney, except renal pelvis: Secondary | ICD-10-CM | POA: Diagnosis not present

## 2019-11-24 DIAGNOSIS — R69 Illness, unspecified: Secondary | ICD-10-CM | POA: Diagnosis not present

## 2019-11-28 ENCOUNTER — Telehealth: Payer: Self-pay | Admitting: Internal Medicine

## 2019-11-28 MED ORDER — LEVOTHYROXINE SODIUM 150 MCG PO TABS: 150.0000 ug | ORAL_TABLET | Freq: Every day | ORAL | 0 refills | Status: DC

## 2019-11-28 NOTE — Telephone Encounter (Signed)
.  Medication Refill Request  Did you call your pharmacy and request this refill first?  YES  . If patient has not contacted pharmacy first, instruct them to do so for future refills.  . Remind them that contacting the pharmacy for their refill is the quickest method to get the refill.  . Refill policy also stated that it will take anywhere between 24-72 hours to receive the refill.    Name of medication? levothyroxine 150 MCG tablet   Is this a 90 day supply? Just 30 days until his 10/18/20201 appointment   Name and location of pharmacy? [facility]

## 2019-12-01 ENCOUNTER — Other Ambulatory Visit: Payer: Self-pay

## 2019-12-01 MED ORDER — LEVOTHYROXINE SODIUM 150 MCG PO TABS: 150.0000 ug | ORAL_TABLET | Freq: Every day | ORAL | 0 refills | Status: DC

## 2019-12-03 MED ORDER — LEVOTHYROXINE SODIUM 150 MCG PO TABS: 150.0000 ug | ORAL_TABLET | Freq: Every day | ORAL | 0 refills | Status: DC

## 2019-12-03 NOTE — Addendum Note (Signed)
Addended by: Amado Coe on: 12/03/2019 04:42 PM   Modules accepted: Orders

## 2019-12-04 ENCOUNTER — Ambulatory Visit: Payer: Medicare HMO | Admitting: Internal Medicine

## 2019-12-04 ENCOUNTER — Encounter: Payer: Self-pay | Admitting: Internal Medicine

## 2019-12-04 ENCOUNTER — Other Ambulatory Visit: Payer: Medicare HMO

## 2019-12-04 ENCOUNTER — Other Ambulatory Visit: Payer: Self-pay

## 2019-12-04 ENCOUNTER — Ambulatory Visit (HOSPITAL_COMMUNITY): Payer: Medicare HMO

## 2019-12-04 VITALS — BP 132/80 | HR 69 | Ht 68.0 in | Wt 181.3 lb

## 2019-12-04 DIAGNOSIS — E2749 Other adrenocortical insufficiency: Secondary | ICD-10-CM | POA: Diagnosis not present

## 2019-12-04 DIAGNOSIS — E039 Hypothyroidism, unspecified: Secondary | ICD-10-CM | POA: Diagnosis not present

## 2019-12-04 DIAGNOSIS — E871 Hypo-osmolality and hyponatremia: Secondary | ICD-10-CM | POA: Diagnosis not present

## 2019-12-04 LAB — T4, FREE: Free T4: 0.89 ng/dL (ref 0.60–1.60)

## 2019-12-04 LAB — TSH: TSH: 7.45 u[IU]/mL — ABNORMAL HIGH (ref 0.35–4.50)

## 2019-12-04 NOTE — Progress Notes (Signed)
Patient ID: [name], male   DOB: Oct 10, 1945, 74 y.o.   MRN: [id number]   This visit occurred during the SARS-CoV-2 public health emergency.  Safety protocols were in place, including screening questions prior to the visit, additional usage of staff PPE, and extensive cleaning of exam room while observing appropriate contact time as indicated for disinfecting solutions.   HPI  [name] is a 74 y.o.-year-old male, returning for follow-up for hypothyroidism and adrenal insufficiency.  Last OV 6 months ago.  Reviewed history: Patient was admitted for weakness on 03/13/2018 with mild hyponatremia of 129.  However, he was recently admitted on 03/24/2018 with severe hyponatremia, at 115.  At that time, he was found to be profoundly hypothyroid and also adrenal insufficiency.  He was started on hormonal replacement for both conditions and his sodium was slowly repeated.  His sodium improved to normal before discharge.  He was sent home with levothyroxine and hydrocortisone.  He was advised to see endocrinology in 1 month.  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 He had recurrence in 09/2017 >> metastases in Lung, Liver, and L kidney. He is on ipilimumab Curt Bears) and Nivolumab (Opdivo) - started 09/2017, continued q3 weeks x 4 >> stopped after developing weakness in ~12/2017.  Reviewing his oncologist note, he will continue with p.o. Biologics if needed after he has a new CT scan.  He finished RxTx in 10/2019.  Primary Hypothyroidism: He was diagnosed with hypothyroidism in 03/2018 and started on levothyroxine. We subsequently increased to 125 mcg daily in 07/2018 and then increased to dose in 10/2018.  Pt is on levothyroxine 150 mcg daily, taken: - in am - fasting - at least 30 min from b'fast - no Ca, Fe, + MVI at night - stopped PPI - + Pepcid later in the day - not on Biotin  Reviewed his TFTs: Lab Results  Component  Value Date   TSH 0.98 06/01/2019   TSH 2.15 04/20/2019   TSH 4.24 12/06/2018   TSH 5.47 (H) 10/31/2018   TSH 12.01 (H) 09/14/2018   TSH 34.68 (H) 08/03/2018   TSH 64.97 (H) 04/19/2018   TSH 86.397 (H) 03/24/2018   TSH 66.300 (H) 03/24/2018   TSH 0.312 (L) 01/06/2018   FREET4 1.18 06/01/2019   FREET4 1.04 04/20/2019   FREET4 1.01 12/06/2018   FREET4 1.00 10/31/2018   FREET4 0.92 09/14/2018   FREET4 0.73 08/03/2018   FREET4 0.37 (L) 04/19/2018   FREET4 <0.25 (L) 03/24/2018   T3FREE 0.5 (L) 03/24/2018   Received labs from PCP, on 10/19/2018: CBC with low hemoglobin, at 12.5 CMP with high glucose at 178, BUN/creatinine 18/1.78, GFR 37, otherwise normal TSH 2.64, free T4 1.35  Antithyroid antibodies: No results found for: THGAB No components found for: TPOAB  Pt denies: - feeling nodules in neck - hoarseness - dysphagia - choking - SOB with lying down  No FH of thyroid disease or cancer. No h/o radiation tx to head or neck.  No seaweed or kelp. No recent contrast studies. No herbal supplements. No Biotin use. No recent steroids use.   Adrenal insufficiency:  He was found to have a low cortisol, of 2.4 on 03/24/2018.    A cosyntropin stimulation test was abnormal the next day. Component     Latest Ref Rng & Units 03/24/2018 03/25/2018  Cortisol, Base     ug/dL  2.2  Cortisol, 30 Min  ug/dL  6.9  Cortisol, 60 Min     ug/dL  8.6  Cortisol, Plasma     ug/dL 2.4    He had significant fatigue, weakness, before starting hydrocortisone.  He was started on a higher dose, 20 mg a.m. and 10 mg in a.m. and he felt much better immediately afterwards.  We were able to subsequently decrease the dose to 10 mg in a.m. and 5 mg in p.m. however, he did misunderstood instructions and was taking only 5 mg twice a day.  He did develop fatigue on this dose so we increased the dose back to 10 mg in a.m. and 5 mg in p.m. at last visit.  He denies unintentional weight loss, nausea, weakness,  dizziness, headaches. He has fatigue and decreased appetite, improved after finishing RxTx.  He did not have to double up on the dose of hydrocortisone or take this IV/IM since last visit.  Hyponatremia: -Developed in 02/2018 -Most likely secondary to uncontrolled central adrenal insufficiency and hypothyroidism  Reviewed his sodium levels: Lab Results  Component Value Date   NA 143 08/03/2019   NA 145 04/05/2019   NA 144 12/30/2018   NA 140 08/30/2018   NA 142 05/17/2018   NA 137 04/08/2018   NA 135 03/28/2018   NA 135 03/27/2018   NA 129 (L) 03/26/2018   NA 124 (L) 03/25/2018   NA 123 (L) 03/25/2018   NA 120 (L) 03/25/2018   NA 117 (LL) 03/24/2018   NA 116 (LL) 03/24/2018   NA 115 (LL) 03/24/2018   NA 116 (L) 03/24/2018   NA 120 (L) 03/22/2018   NA 136 03/16/2018   NA 134 (L) 03/15/2018   NA 131 (L) 03/14/2018   NA 129 (L) 03/13/2018   NA 134 03/11/2018   NA 136 03/04/2018   NA 135 02/10/2018   NA 141 01/26/2018   NA 139 01/12/2018   NA 137 01/06/2018   NA 140 12/30/2017   NA 141 12/15/2017   NA 141 11/24/2017   He had lung CA in 1993-1994.  He quit smoking in 1990  ROS: Constitutional: no weight gain/no weight loss, + fatigue, no subjective hyperthermia, no subjective hypothermia Eyes: no blurry vision, no xerophthalmia ENT: no sore throat, + see HPI Cardiovascular: no CP/no SOB/no palpitations/no leg swelling Respiratory: no cough/no SOB/no wheezing Gastrointestinal: no N/no V/no D/no C/no acid reflux Musculoskeletal: no muscle aches/no joint aches Skin: no rashes, no hair loss Neurological: no tremors/no numbness/no tingling/no dizziness  I reviewed pt's medications, allergies, PMH, social hx, family hx, and changes were documented in the history of present illness. Otherwise, unchanged from my initial visit note.  Past Medical History:  Diagnosis Date  . Arthritis   . Chronic kidney disease    only has one kidney   . Clear cell renal cell carcinoma  s/p robotic nephrectomy Dec 2016 02/01/2015  . Coronary artery disease    followed by Dr.Tilley  . GERD (gastroesophageal reflux disease)   . Heart murmur   . High cholesterol   . Hx of cancer of lung 1980's  . Hypertension   . Incisional hernia 08/01/2015  . Lung cancer ([place]) 1993  . Lung metastases ([place]) 2019  . Recurrent umbilical hernia 05/15/5186  . Right renal mass    Past Surgical History:  Procedure Laterality Date  . APPENDECTOMY  age 29  . CHOLECYSTECTOMY  1980's  . LAPAROSCOPIC LYSIS OF ADHESIONS N/A 08/01/2015   Procedure: LAPAROSCOPIC LYSIS OF ADHESIONS;  Surgeon: Remo Lipps  Gross, MD;  Location: [facility];  Service: General;  Laterality: N/A;  . LUNG LOBECTOMY  1992   lung cancer- patient has staples in lung not to have MRI per patient   . PILONIDAL CYST EXCISION  2011   Dr Harlow Asa  . ROBOTIC ASSITED PARTIAL NEPHRECTOMY Right 02/01/2015   Procedure: RIGHT ROBOTIC ASSISTED LAPAROSOCOPY NEPHRECTOMY;  Surgeon: Ardis Hughs, MD;  Location: [facility];  Service: Urology;  Laterality: Right;  . VENTRAL HERNIA REPAIR N/A 08/01/2015   Procedure: LAPAROSCOPIC VENTRAL WALL HERNIA WITH MESH;  Surgeon: Michael Boston, MD;  Location: [facility];  Service: General;  Laterality: N/A;   Social History   Socioeconomic History  . Marital status: Married    Spouse name: Not on file  . Number of children: Not on file  . Years of education: Not on file  . Highest education level: Not on file  Occupational History  . Occupation: Retired  Tobacco Use  . Smoking status: Former Smoker    Packs/day: 1.00    Years: 20.00    Pack years: 20.00    Quit date: 01/29/1988    Years since quitting: 31.8  . Smokeless tobacco: Never Used  Substance and Sexual Activity  . Alcohol use: Yes    Comment: rare  . Drug use: No  . Sexual activity: Not on file  Other Topics Concern  . Not on file  Social History Narrative  . Not on file   Social Determinants of Health   Financial Resource Strain:   .  Difficulty of Paying Living Expenses: Not on file  Food Insecurity:   . Worried About Charity fundraiser in the Last Year: Not on file  . Ran Out of Food in the Last Year: Not on file  Transportation Needs:   . Lack of Transportation (Medical): Not on file  . Lack of Transportation (Non-Medical): Not on file  Physical Activity:   . Days of Exercise per Week: Not on file  . Minutes of Exercise per Session: Not on file  Stress:   . Feeling of Stress : Not on file  Social Connections:   . Frequency of Communication with Friends and Family: Not on file  . Frequency of Social Gatherings with Friends and Family: Not on file  . Attends Religious Services: Not on file  . Active Member of Clubs or Organizations: Not on file  . Attends Archivist Meetings: Not on file  . Marital Status: Not on file  Intimate Partner Violence:   . Fear of Current or Ex-Partner: Not on file  . Emotionally Abused: Not on file  . Physically Abused: Not on file  . Sexually Abused: Not on file   Current Outpatient Medications on File Prior to Visit  Medication Sig Dispense Refill  . acetaminophen (TYLENOL) 325 MG tablet Take 2 tablets (650 mg total) by mouth every 6 (six) hours as needed for mild pain or headache (over the counter). 30 tablet 3  . aspirin 81 MG chewable tablet Chew 81 mg by mouth daily.    Marland Kitchen diltiazem (CARDIZEM CD) 120 MG 24 hr capsule     . famotidine (PEPCID) 20 MG tablet Take 20 mg by mouth 2 (two) times daily.    . hydrocortisone (CORTEF) 5 MG tablet Take 2 tablets in am and 1 tablet in pm, ~ 2pm 300 tablet 3  . levothyroxine (SYNTHROID) 150 MCG tablet Take 1 tablet (150 mcg total) by mouth daily. 30 tablet 0  . Multiple  Vitamin (MULTIVITAMIN WITH MINERALS) TABS tablet Take 1 tablet by mouth daily. 30 tablet 3  . temazepam (RESTORIL) 7.5 MG capsule Take 1 capsule (7.5 mg total) by mouth at bedtime as needed for sleep. 30 capsule 0   No current facility-administered medications on  file prior to visit.   Allergies  Allergen Reactions  . Benadryl [Diphenhydramine] Other (See Comments)    Severe restless legs   Family History  Problem Relation Age of Onset  . Heart attack Father    Cardiologist: Dr. Sallyanne Kuster.  PE: BP 132/80   Pulse 69   Ht 5\' 8"  (1.727 m)   Wt 181 lb 4.8 oz (82.2 kg)   SpO2 96%   BMI 27.57 kg/m  Wt Readings from Last 3 Encounters:  12/04/19 181 lb 4.8 oz (82.2 kg)  10/16/19 180 lb 6.4 oz (81.8 kg)  09/28/19 183 lb 9.6 oz (83.3 kg)   Constitutional: overweight, in NAD Eyes: PERRLA, EOMI, no exophthalmos ENT: moist mucous membranes, no thyromegaly, no cervical lymphadenopathy Cardiovascular: RRR, No MRG Respiratory: CTA B Gastrointestinal: abdomen soft, NT, ND, BS+ Musculoskeletal: no deformities, strength intact in all 4 Skin: moist, warm, no rashes Neurological: no tremor with outstretched hands, DTR normal in all 4  ASSESSMENT: 1. Primary Hypothyroidism  2. Central Adrenal Insufficiency  3. Hyponatremia -Low sodium: 115 during his admission from 03/2018, likely due to uncontrolled hypothyroidism and central adrenal insufficiency, which are not treated -Sodium normalized afterwards.  PLAN:  1. Patient with primary hypothyroidism, on levothyroxine -Likely secondary to ipilimumab -He was admitted in 03/2018 and a TSH was found to be 86.  He was initially given LT4 iv, then 50 mcg p.o. we increased the dose afterwards - latest thyroid labs reviewed with pt >> normal: Lab Results  Component Value Date   TSH 0.98 06/01/2019   - he continues on LT4 150 mcg daily - pt feels good on this dose but still has some fatigue after finishing his RxTx - we discussed about taking the thyroid hormone every day, with water, >30 minutes before breakfast, separated by >4 hours from acid reflux medications, calcium, iron, multivitamins. Pt. is taking it correctly. - will check thyroid tests today: TSH and fT4 - If labs are abnormal, he will  need to return for repeat TFTs in 1.5 months  2. Central Adrenal Insufficiency -Likely secondary to ipilimumab -He did not have to double the dose of hydrocortisone take it parenterally since last visit -he was previously on hydrocortisone 20 mg in the a.m. and 10 mg in p.m. and I advised him to decrease the dose to a more physiologic 10 mg in a.m. and 5 mg in p.m. however, he misunderstood instructions and was only taking 5 mg twice a day, however, since he did not have fatigue, dizziness, nausea, abdominal pain, we continued the same dose but I advised him to let me know if he developed symptoms.  Before last visit, he called me with fatigue in the setting of a normal TSH.  At last visit, we increased his hydrocortisone to 10 mg in a.m. and 5 g in p.m. -I again advised him to get the med alert bracelet -Again discussed about sick day rules:  If you cannot keep anything down, including your hydrocortisone medication, please go to the emergency room or your primary care physician office to get steroids injected in the muscle or vein.   If you have a fever (more than 100 Fahrenheit) or gastroenteritis with nausea/vomiting and diarrhea, please double the  dose of your hydrocortisone for the duration of the fever or the gastroenteritis.  Do not run out of your hydrocortisone medication.  Needs refills LT4.  Component     Latest Ref Rng & Units 12/04/2019  TSH     0.35 - 4.50 uIU/mL 7.45 (H)  T4,Free(Direct)     0.60 - 1.60 ng/dL 0.89  TSH elevated.  Since previous TFTs were normal on the same dose, I would like to repeat the tests before changing the dose.  Philemon Kingdom, MD PhD [facility] Endocrinology

## 2019-12-04 NOTE — Patient Instructions (Signed)
Please continueHydrocortisone to 10 mg in a and 5 mg in the pm (~2 pm).  - You absolutely need to take this medication every day and not skip doses. - Please double the dose if you have a fever, for the duration of the fever. - If you cannot take anything by mouth (vomiting) or you have severe diarrhea so that you eliminate the hydrocortisone pills in your stool, please make sure that you get the hydrocortisone in the vein instead - go to the nearest emergency department/urgent care or you may go to your PCPs office  - Please try to get a MedAlert bracelet or pendant indicating: "Adrenal insufficiency".  Please continue: - Levothyroxine 150 mcg daily.  Take the thyroid hormone every day, with water, at least 30 minutes before breakfast, separated by at least 4 hours from: - acid reflux medications - calcium - iron - multivitamins  Please come back for a follow-up appointment in 6 months.

## 2019-12-06 ENCOUNTER — Encounter: Payer: Self-pay | Admitting: Internal Medicine

## 2019-12-11 ENCOUNTER — Ambulatory Visit: Payer: Self-pay | Admitting: Radiation Oncology

## 2019-12-14 ENCOUNTER — Ambulatory Visit (HOSPITAL_COMMUNITY): Payer: Medicare HMO

## 2019-12-14 ENCOUNTER — Inpatient Hospital Stay: Payer: Medicare HMO | Attending: Oncology

## 2019-12-17 NOTE — Progress Notes (Incomplete)
  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 He did report right-sided pain that improved with Tylenol, loose stools, mild nausea, fatigue, and poor appetite. He denied vomitting.   Plan: The patient will follow-up with radiation oncology in one month.  ________________________________________________   Blair Promise, PhD, MD  This document serves as a record of services personally performed by Gery Pray, MD. It was created on his behalf by Clerance Lav, a trained medical scribe. The creation of this record is based on the scribe's personal observations and the provider's statements to them. This document has been checked and approved by the attending provider.

## 2019-12-18 ENCOUNTER — Ambulatory Visit: Payer: Medicare HMO | Admitting: Radiation Oncology

## 2019-12-26 ENCOUNTER — Ambulatory Visit (HOSPITAL_COMMUNITY)
Admission: RE | Admit: 2019-12-26 | Discharge: 2019-12-26 | Disposition: A | Discharge status: Home / Self Care | Payer: Medicare HMO | Source: Ambulatory Visit | Attending: Oncology | Admitting: Oncology

## 2019-12-26 ENCOUNTER — Other Ambulatory Visit: Payer: Self-pay

## 2019-12-26 DIAGNOSIS — I700 Atherosclerosis of aorta: Secondary | ICD-10-CM | POA: Diagnosis not present

## 2019-12-26 DIAGNOSIS — R918 Other nonspecific abnormal finding of lung field: Secondary | ICD-10-CM | POA: Diagnosis not present

## 2019-12-26 DIAGNOSIS — K7689 Other specified diseases of liver: Secondary | ICD-10-CM | POA: Diagnosis not present

## 2019-12-26 DIAGNOSIS — C641 Malignant neoplasm of right kidney, except renal pelvis: Secondary | ICD-10-CM

## 2019-12-26 DIAGNOSIS — N280 Ischemia and infarction of kidney: Secondary | ICD-10-CM | POA: Diagnosis not present

## 2019-12-26 DIAGNOSIS — I2510 Atherosclerotic heart disease of native coronary artery without angina pectoris: Secondary | ICD-10-CM | POA: Diagnosis not present

## 2019-12-26 DIAGNOSIS — K5730 Diverticulosis of large intestine without perforation or abscess without bleeding: Secondary | ICD-10-CM | POA: Diagnosis not present

## 2019-12-27 ENCOUNTER — Inpatient Hospital Stay: Payer: Medicare HMO | Attending: Oncology

## 2019-12-27 ENCOUNTER — Other Ambulatory Visit: Payer: Self-pay

## 2019-12-27 ENCOUNTER — Inpatient Hospital Stay (HOSPITAL_BASED_OUTPATIENT_CLINIC_OR_DEPARTMENT_OTHER): Payer: Medicare HMO | Admitting: Oncology

## 2019-12-27 VITALS — BP 113/62 | HR 72 | Temp 97.5°F | Resp 18 | Ht 68.0 in | Wt 183.6 lb

## 2019-12-27 DIAGNOSIS — C649 Malignant neoplasm of unspecified kidney, except renal pelvis: Secondary | ICD-10-CM | POA: Diagnosis present

## 2019-12-27 DIAGNOSIS — Z79899 Other long term (current) drug therapy: Secondary | ICD-10-CM | POA: Insufficient documentation

## 2019-12-27 DIAGNOSIS — Z9221 Personal history of antineoplastic chemotherapy: Secondary | ICD-10-CM | POA: Diagnosis not present

## 2019-12-27 DIAGNOSIS — Z905 Acquired absence of kidney: Secondary | ICD-10-CM | POA: Diagnosis not present

## 2019-12-27 DIAGNOSIS — C772 Secondary and unspecified malignant neoplasm of intra-abdominal lymph nodes: Secondary | ICD-10-CM | POA: Insufficient documentation

## 2019-12-27 DIAGNOSIS — C641 Malignant neoplasm of right kidney, except renal pelvis: Secondary | ICD-10-CM

## 2019-12-27 DIAGNOSIS — Z923 Personal history of irradiation: Secondary | ICD-10-CM | POA: Diagnosis not present

## 2019-12-27 LAB — CMP (CANCER CENTER ONLY)
ALT: 12 U/L (ref 0–44)
AST: 15 U/L (ref 15–41)
Albumin: 3.9 g/dL (ref 3.5–5.0)
Alkaline Phosphatase: 55 U/L (ref 38–126)
Anion gap: 8 (ref 5–15)
BUN: 17 mg/dL (ref 8–23)
CO2: 28 mmol/L (ref 22–32)
Calcium: 9.2 mg/dL (ref 8.9–10.3)
Chloride: 103 mmol/L (ref 98–111)
Creatinine: 1.49 mg/dL — ABNORMAL HIGH (ref 0.61–1.24)
GFR, Estimated: 48 mL/min — ABNORMAL LOW (ref >60)
Glucose, Bld: 146 mg/dL — ABNORMAL HIGH (ref 70–99)
Potassium: 4.1 mmol/L (ref 3.5–5.1)
Sodium: 139 mmol/L (ref 135–145)
Total Bilirubin: 0.4 mg/dL (ref 0.3–1.2)
Total Protein: 7.3 g/dL (ref 6.5–8.1)

## 2019-12-27 LAB — CBC WITH DIFFERENTIAL (CANCER CENTER ONLY)
Abs Immature Granulocytes: 0.01 10*3/uL (ref 0.00–0.07)
Basophils Absolute: 0 10*3/uL (ref 0.0–0.1)
Basophils Relative: 0 %
Eosinophils Absolute: 0.2 10*3/uL (ref 0.0–0.5)
Eosinophils Relative: 3 %
HCT: 34.9 % — ABNORMAL LOW (ref 39.0–52.0)
Hemoglobin: 11.8 g/dL — ABNORMAL LOW (ref 13.0–17.0)
Immature Granulocytes: 0 %
Lymphocytes Relative: 20 %
Lymphs Abs: 1.1 10*3/uL (ref 0.7–4.0)
MCH: 29.9 pg (ref 26.0–34.0)
MCHC: 33.8 g/dL (ref 30.0–36.0)
MCV: 88.4 fL (ref 80.0–100.0)
Monocytes Absolute: 0.6 10*3/uL (ref 0.1–1.0)
Monocytes Relative: 12 %
Neutro Abs: 3.4 10*3/uL (ref 1.7–7.7)
Neutrophils Relative %: 65 %
Platelet Count: 206 10*3/uL (ref 150–400)
RBC: 3.95 MIL/uL — ABNORMAL LOW (ref 4.22–5.81)
RDW: 13 % (ref 11.5–15.5)
WBC Count: 5.3 10*3/uL (ref 4.0–10.5)
nRBC: 0 % (ref 0.0–0.2)

## 2019-12-27 NOTE — Progress Notes (Signed)
Radiation Oncology         [phone] ________________________________  Name: [name] MRN: [id number]  Date: 12/28/2019  DOB: [birth year]  Follow-Up Visit Note  CC: [name], MD  Wyatt Portela, MD    ICD-10-CM   1. Primary malignant neoplasm of kidney with metastasis from kidney to other site, unspecified laterality ([place])  C64.9   2. Clear cell carcinoma of right kidney (H[facility])  C64.1     Diagnosis: Stage IV clear cell renal cell carcinoma with hepatic metastasis and omental involvement  Interval Since Last Radiation: One month, two weeks, and six days  Radiation Treatment Dates: 10/26/2019 through 11/08/2019 Site Technique Total Dose (Gy) Dose per Fx (Gy) Completed Fx Beam Energies  Abdomen: Abd IMRT 30/30 3 10/10 10X    Narrative:  The patient returns today for routine follow-up. Since the end of treatment, the patient underwent a CT scan of chest/abdomen/pelvis on 12/26/2019 that showed continued interval enlargement of right retroperitoneal lymph nodes and stranded soft tissue, although it was difficult to clearly distinguish from the closely adjacent pancreatic head on non-contrast examination. The largest aortocaval lymph node measured 2.9 x 2.2 cm, which was previously 2.5 x 2.0 cm. Findings were consistent with worsened nodal metastatic disease. Appearance of the right nephrectomy bed with fat necrosis and irregular nodule of the omentum in the left anterior abdomen both remained unchanged. Additionally, the small pulmonary nodules of the left lower lobe remained stable and were noted to almost certainly be benign incidental sequelae of infection or inflammation. Finally, there were re-demonstrated postoperative findings of right lower and middle lobectomy.   The patient was seen by Dr. Alen Blew on 12/27/2019, at which time he was noted to be doing well following treatment and remained under active surveillance.  On review of systems, he reports no complaints. He  denies all symptoms.  Fatigue has resolved at this time.            ALLERGIES:  is allergic to benadryl [diphenhydramine].  Meds: Current Outpatient Medications  Medication Sig Dispense Refill  . acetaminophen (TYLENOL) 325 MG tablet Take 2 tablets (650 mg total) by mouth every 6 (six) hours as needed for mild pain or headache (over the counter). 30 tablet 3  . aspirin 81 MG chewable tablet Chew 81 mg by mouth daily.    Marland Kitchen diltiazem (CARDIZEM CD) 120 MG 24 hr capsule     . famotidine (PEPCID) 20 MG tablet Take 20 mg by mouth 2 (two) times daily.    . hydrocortisone (CORTEF) 5 MG tablet Take 2 tablets in am and 1 tablet in pm, ~ 2pm 300 tablet 3  . levothyroxine (SYNTHROID) 150 MCG tablet Take 1 tablet (150 mcg total) by mouth daily. 30 tablet 0  . Multiple Vitamin (MULTIVITAMIN WITH MINERALS) TABS tablet Take 1 tablet by mouth daily. 30 tablet 3  . temazepam (RESTORIL) 7.5 MG capsule Take 1 capsule (7.5 mg total) by mouth at bedtime as needed for sleep. 30 capsule 0   No current facility-administered medications for this encounter.    Physical Findings: The patient is in no acute distress. Patient is alert and oriented.  height is 5\' 8"  (1.727 m) and weight is 184 lb (83.5 kg). His temporal temperature is 97.1 F (36.2 C) (abnormal). His blood pressure is 124/74 and his pulse is 69. His respiration is 18 and oxygen saturation is 96%.  No significant changes. Lungs are clear to auscultation bilaterally. Heart has regular rate and rhythm. No  palpable cervical, supraclavicular, or axillary adenopathy. Abdomen soft, non-tender, normal bowel sounds.   Lab Findings: Lab Results  Component Value Date   WBC 5.3 12/27/2019   HGB 11.8 (L) 12/27/2019   HCT 34.9 (L) 12/27/2019   MCV 88.4 12/27/2019   PLT 206 12/27/2019    Radiographic Findings: CT ABDOMEN PELVIS WO CONTRAST  Result Date: 12/26/2019 CLINICAL DATA:  Metastatic right renal cell carcinoma, status post right nephrectomy and right  lower lobectomy EXAM: CT CHEST, ABDOMEN AND PELVIS WITHOUT CONTRAST TECHNIQUE: Multidetector CT imaging of the chest, abdomen and pelvis was performed following the standard protocol without IV contrast. COMPARISON:  CT abdomen pelvis, 08/22/2019, CT chest abdomen pelvis, 01/11/2019 FINDINGS: CT CHEST FINDINGS Cardiovascular: Aortic atherosclerosis. Normal heart size. Three-vessel coronary artery calcifications and/or stents. No pericardial effusion. Mediastinum/Nodes: No enlarged mediastinal, hilar, or axillary lymph nodes. Thyroid gland, trachea, and esophagus demonstrate no significant findings. Lungs/Pleura: Redemonstrated postoperative findings of right lower and middle lobectomy. Small pulmonary nodules in the left lower lobe measuring 5 mm or smaller are stable (series 4, image 99). No pleural effusion or pneumothorax. Musculoskeletal: No chest wall mass or suspicious bone lesions identified. CT ABDOMEN PELVIS FINDINGS Hepatobiliary: Unchanged intermediate attenuation subcapsular lesion of the anterior right lobe of the liver, previously characterized as a nonenhancing hemorrhagic or proteinaceous cyst (series 2, image 54). Status post cholecystectomy. No biliary dilatation. Pancreas: Unremarkable. No pancreatic ductal dilatation or surrounding inflammatory changes. Spleen: Normal in size without significant abnormality. Adrenals/Urinary Tract: Adrenal glands are unremarkable. Status post right nephrectomy. Unchanged appearance of the nephrectomy bed with rim like fat necrosis (series 2, image 74). Stomach/Bowel: Stomach is within normal limits. Appendix appears normal. No evidence of bowel wall thickening, distention, or inflammatory changes. Descending and sigmoid diverticulosis. Vascular/Lymphatic: Aortic atherosclerosis. Continued interval enlargement of right retroperitoneal lymph nodes and stranded soft tissue, although difficult to clearly distinguish from the closely adjacent pancreatic head, the  largest aortocaval lymph node measures 2.9 x 2.2 cm, previously 2.5 x 2.0 cm when measured similarly (series 2, image 72). Reproductive: No mass or other abnormality. Other: No abdominal wall hernia or abnormality. Unchanged irregular soft tissue nodule of the omentum measuring approximately 1.2 cm in the left anterior abdomen (series 2, image 73). No abdominopelvic ascites. Musculoskeletal: No acute or significant osseous findings. IMPRESSION: 1. Continued interval enlargement of right retroperitoneal lymph nodes and stranded soft tissue, although difficult to clearly distinguish from the closely adjacent pancreatic head on noncontrast examination, the largest aortocaval lymph node measures 2.9 x 2.2 cm, previously 2.5 x 2.0 cm when measured similarly. Findings are consistent with worsened nodal metastatic disease. 2. Unchanged appearance of the right nephrectomy bed with fat necrosis. 3. Unchanged irregular nodule of the omentum in the left anterior abdomen, of uncertain nature. Attention on follow-up. 4. Redemonstrated postoperative findings of right lower and middle lobectomy. 5. Stable small pulmonary nodules of the left lower lobe, almost certainly benign incidental sequelae of infection or inflammation. Attention on follow-up. 6. Coronary artery disease.  Aortic Atherosclerosis (ICD10-I70.0). Electronically Signed   By: Eddie Candle M.D.   On: 12/26/2019 16:48   CT CHEST WO CONTRAST  Result Date: 12/26/2019 CLINICAL DATA:  Metastatic right renal cell carcinoma, status post right nephrectomy and right lower lobectomy EXAM: CT CHEST, ABDOMEN AND PELVIS WITHOUT CONTRAST TECHNIQUE: Multidetector CT imaging of the chest, abdomen and pelvis was performed following the standard protocol without IV contrast. COMPARISON:  CT abdomen pelvis, 08/22/2019, CT chest abdomen pelvis, 01/11/2019 FINDINGS: CT CHEST FINDINGS Cardiovascular:  Aortic atherosclerosis. Normal heart size. Three-vessel coronary artery  calcifications and/or stents. No pericardial effusion. Mediastinum/Nodes: No enlarged mediastinal, hilar, or axillary lymph nodes. Thyroid gland, trachea, and esophagus demonstrate no significant findings. Lungs/Pleura: Redemonstrated postoperative findings of right lower and middle lobectomy. Small pulmonary nodules in the left lower lobe measuring 5 mm or smaller are stable (series 4, image 99). No pleural effusion or pneumothorax. Musculoskeletal: No chest wall mass or suspicious bone lesions identified. CT ABDOMEN PELVIS FINDINGS Hepatobiliary: Unchanged intermediate attenuation subcapsular lesion of the anterior right lobe of the liver, previously characterized as a nonenhancing hemorrhagic or proteinaceous cyst (series 2, image 54). Status post cholecystectomy. No biliary dilatation. Pancreas: Unremarkable. No pancreatic ductal dilatation or surrounding inflammatory changes. Spleen: Normal in size without significant abnormality. Adrenals/Urinary Tract: Adrenal glands are unremarkable. Status post right nephrectomy. Unchanged appearance of the nephrectomy bed with rim like fat necrosis (series 2, image 74). Stomach/Bowel: Stomach is within normal limits. Appendix appears normal. No evidence of bowel wall thickening, distention, or inflammatory changes. Descending and sigmoid diverticulosis. Vascular/Lymphatic: Aortic atherosclerosis. Continued interval enlargement of right retroperitoneal lymph nodes and stranded soft tissue, although difficult to clearly distinguish from the closely adjacent pancreatic head, the largest aortocaval lymph node measures 2.9 x 2.2 cm, previously 2.5 x 2.0 cm when measured similarly (series 2, image 72). Reproductive: No mass or other abnormality. Other: No abdominal wall hernia or abnormality. Unchanged irregular soft tissue nodule of the omentum measuring approximately 1.2 cm in the left anterior abdomen (series 2, image 73). No abdominopelvic ascites. Musculoskeletal: No acute  or significant osseous findings. IMPRESSION: 1. Continued interval enlargement of right retroperitoneal lymph nodes and stranded soft tissue, although difficult to clearly distinguish from the closely adjacent pancreatic head on noncontrast examination, the largest aortocaval lymph node measures 2.9 x 2.2 cm, previously 2.5 x 2.0 cm when measured similarly. Findings are consistent with worsened nodal metastatic disease. 2. Unchanged appearance of the right nephrectomy bed with fat necrosis. 3. Unchanged irregular nodule of the omentum in the left anterior abdomen, of uncertain nature. Attention on follow-up. 4. Redemonstrated postoperative findings of right lower and middle lobectomy. 5. Stable small pulmonary nodules of the left lower lobe, almost certainly benign incidental sequelae of infection or inflammation. Attention on follow-up. 6. Coronary artery disease.  Aortic Atherosclerosis (ICD10-I70.0). Electronically Signed   By: Eddie Candle M.D.   On: 12/26/2019 16:48    Impression: Stage IV clear cell renal cell carcinoma with hepatic metastasis and omental involvement  The patient is recovering from the effects of radiation. Most recent CT scan of chest/abdomen/pelvis showed continued slight interval enlargement of right retroperitoneal lymph nodes and stranded soft tissue, although it was difficult to clearly distinguish from the closely adjacent pancreatic head on non-contrast examination. The largest aortocaval lymph node measured 2.9 x 2.2 cm, which was previously 2.5 x 2.0 cm. Findings were consistent with worsened nodal metastatic disease.  I reviewed the report with the patient.  At this time he will continue on observation but will proceed with additional imaging in approximately 3 months ordered by Dr. Alen Blew.  Plan: As needed follow-up in radiation oncology   ____________________________________   Blair Promise, PhD, MD  This document serves as a record of services personally  performed by Gery Pray, MD. It was created on his behalf by Clerance Lav, a trained medical scribe. The creation of this record is based on the scribe's personal observations and the provider's statements to them. This document has been checked and approved  by the attending provider.

## 2019-12-27 NOTE — Progress Notes (Signed)
Hematology and Oncology Follow Up Visit  [name] [id number] [birth year] 74 y.o. 12/27/2019 3:17 PM [name], MDElkins, [name], *   Principle Diagnosis: 74 year old man with kidney cancer diagnosed in 2016.  He developed stage IV with hepatic and omental involvement in 2019.  Prior Therapy: He underwent a radical nephrectomy completed by Dr. Louis Meckel on February 01, 2015.  The final pathology revealed a 3.3 cm clear cell renal cell carcinoma with Fuhrman grade 3.  Margins were negative indicating stage T1 a disease  He is status post omental biopsy in July 2019 which confirmed the presence of recurrent metastatic renal cell carcinoma.  Nivolumab at 3 mg/mg and ipilimumab at 1 mg/kg cycle 1 started on 09/21/2017.  He completed 4 cycles of therapy.   He did not receive any additional immunotherapy after developing adrenal insufficiency and panhypopituitarism.  He is status post radiation therapy to abdominal lymph node completed on November 08, 2019.  He received total of 30 Gy in 10 fractions  Current therapy: Active surveillance.  Interim History: [name] presents today for a follow-up visit.  Since the last visit, he reports no major changes in his health.  He denies any recent hospitalization or illnesses.  He denies nausea vomiting or abdominal pain.  His performance status quality of life remain excellent.  He tolerated radiation therapy without any major complaints.            .      Medications: Reviewed without changes. Current Outpatient Medications  Medication Sig Dispense Refill  . acetaminophen (TYLENOL) 325 MG tablet Take 2 tablets (650 mg total) by mouth every 6 (six) hours as needed for mild pain or headache (over the counter). 30 tablet 3  . aspirin 81 MG chewable tablet Chew 81 mg by mouth daily.    Marland Kitchen diltiazem (CARDIZEM CD) 120 MG 24 hr capsule     . famotidine (PEPCID) 20 MG tablet Take 20 mg by mouth 2 (two) times daily.    .  hydrocortisone (CORTEF) 5 MG tablet Take 2 tablets in am and 1 tablet in pm, ~ 2pm 300 tablet 3  . levothyroxine (SYNTHROID) 150 MCG tablet Take 1 tablet (150 mcg total) by mouth daily. 30 tablet 0  . Multiple Vitamin (MULTIVITAMIN WITH MINERALS) TABS tablet Take 1 tablet by mouth daily. 30 tablet 3  . temazepam (RESTORIL) 7.5 MG capsule Take 1 capsule (7.5 mg total) by mouth at bedtime as needed for sleep. 30 capsule 0   No current facility-administered medications for this visit.     Allergies:  Allergies  Allergen Reactions  . Benadryl [Diphenhydramine] Other (See Comments)    Severe restless legs        Physical Exam:    Blood pressure 113/62, pulse 72, temperature (!) 97.5 F (36.4 C), temperature source Tympanic, resp. rate 18, height 5\' 8"  (1.727 m), weight 183 lb 9.6 oz (83.3 kg), SpO2 97 %.       ECOG: 1   General appearance: Comfortable appearing without any discomfort Head: Normocephalic without any trauma Oropharynx: Mucous membranes are moist and pink without any thrush or ulcers. Eyes: Pupils are equal and round reactive to light. Lymph nodes: No cervical, supraclavicular, inguinal or axillary lymphadenopathy.   Heart:regular rate and rhythm.  S1 and S2 without leg edema. Lung: Clear without any rhonchi or wheezes.  No dullness to percussion. Abdomin: Soft, nontender, nondistended with good bowel sounds.  No hepatosplenomegaly. Musculoskeletal: No joint deformity or effusion.  Full range of  motion noted. Neurological: No deficits noted on motor, sensory and deep tendon reflex exam. Skin: No petechial rash or dryness.  Appeared moist.             Lab Results: Lab Results  Component Value Date   WBC 6.8 08/03/2019   HGB 13.5 08/03/2019   HCT 40.7 08/03/2019   MCV 90.4 08/03/2019   PLT 194 08/03/2019     Chemistry      Component Value Date/Time   NA 143 08/03/2019 0954   NA 134 03/11/2018 0913   K 4.4 08/03/2019 0954   CL 105  08/03/2019 0954   CO2 28 08/03/2019 0954   BUN 22 08/03/2019 0954   BUN 17 03/11/2018 0913   CREATININE 1.54 (H) 08/03/2019 0954      Component Value Date/Time   CALCIUM 9.5 08/03/2019 0954   ALKPHOS 62 08/03/2019 0954   AST 12 (L) 08/03/2019 0954   ALT 12 08/03/2019 0954   BILITOT 0.4 08/03/2019 0954      IMPRESSION: 1. Continued interval enlargement of right retroperitoneal lymph nodes and stranded soft tissue, although difficult to clearly distinguish from the closely adjacent pancreatic head on noncontrast examination, the largest aortocaval lymph node measures 2.9 x 2.2 cm, previously 2.5 x 2.0 cm when measured similarly. Findings are consistent with worsened nodal metastatic disease. 2. Unchanged appearance of the right nephrectomy bed with fat necrosis. 3. Unchanged irregular nodule of the omentum in the left anterior abdomen, of uncertain nature. Attention on follow-up. 4. Redemonstrated postoperative findings of right lower and middle lobectomy. 5. Stable small pulmonary nodules of the left lower lobe, almost certainly benign incidental sequelae of infection or inflammation. Attention on follow-up. 6. Coronary artery disease.  Aortic Atherosclerosis (ICD10-I70.0).   Impression and Plan:  74 year old man with:   1.  Stage IV clear-cell renal cell carcinoma diagnosed in 2019.  He is status post therapy outlined above with a complete response including hepatic and omental disease.   He had developed a recurrence with aortocaval lymph node and received radiation therapy completed in September 2021.  CT scan obtained on 12/26/2019 showed persistent increase in the size in lymph node.  Management options moving forward were reviewed.  These would include continued active surveillance, obtaining tissue biopsy versus empirically treating with systemic therapy.  Oral targeted therapy with cabozantinib among other options could be used.  After discussion today, I elected to  have an interval scan in 3 months and will get decision about treatment accordingly.    2.  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  3.  Weight loss: Resolved at this time and his weight is stable.  4.  Prognosis and goals of care:'s disease is unlikely to be cured but aggressive measures are warranted given his excellent performance status.  5.  Follow-up: In 3 months for repeat evaluation.  30  minutes were spent on this encounter.  Time was dedicated to reviewing imaging studies, treatment options and future plan of care review.   Zola Button, MD 11/10/20213:17 PM

## 2019-12-28 ENCOUNTER — Other Ambulatory Visit: Payer: Self-pay

## 2019-12-28 ENCOUNTER — Encounter: Payer: Self-pay | Admitting: Radiation Oncology

## 2019-12-28 ENCOUNTER — Ambulatory Visit
Admission: RE | Admit: 2019-12-28 | Discharge: 2019-12-28 | Disposition: A | Discharge status: Home / Self Care | Payer: Medicare HMO | Source: Ambulatory Visit | Attending: Radiation Oncology | Admitting: Radiation Oncology

## 2019-12-28 VITALS — BP 124/74 | HR 69 | Temp 97.1°F | Resp 18 | Ht 68.0 in | Wt 184.0 lb

## 2019-12-28 DIAGNOSIS — C787 Secondary malignant neoplasm of liver and intrahepatic bile duct: Secondary | ICD-10-CM | POA: Diagnosis not present

## 2019-12-28 DIAGNOSIS — Z79899 Other long term (current) drug therapy: Secondary | ICD-10-CM | POA: Insufficient documentation

## 2019-12-28 DIAGNOSIS — C786 Secondary malignant neoplasm of retroperitoneum and peritoneum: Secondary | ICD-10-CM | POA: Diagnosis not present

## 2019-12-28 DIAGNOSIS — C772 Secondary and unspecified malignant neoplasm of intra-abdominal lymph nodes: Secondary | ICD-10-CM | POA: Insufficient documentation

## 2019-12-28 DIAGNOSIS — C641 Malignant neoplasm of right kidney, except renal pelvis: Secondary | ICD-10-CM

## 2019-12-28 DIAGNOSIS — C649 Malignant neoplasm of unspecified kidney, except renal pelvis: Secondary | ICD-10-CM

## 2019-12-28 DIAGNOSIS — I2510 Atherosclerotic heart disease of native coronary artery without angina pectoris: Secondary | ICD-10-CM | POA: Insufficient documentation

## 2019-12-28 DIAGNOSIS — Z923 Personal history of irradiation: Secondary | ICD-10-CM | POA: Diagnosis not present

## 2019-12-28 DIAGNOSIS — Z7982 Long term (current) use of aspirin: Secondary | ICD-10-CM | POA: Insufficient documentation

## 2019-12-28 DIAGNOSIS — I700 Atherosclerosis of aorta: Secondary | ICD-10-CM | POA: Insufficient documentation

## 2019-12-28 NOTE — Progress Notes (Signed)
Patient here for a 1 month f/u visit with Dr. Sondra Come. He denies any problems at this time.  BP 124/74 (BP Location: Right Arm, Patient Position: Sitting)   Pulse 69   Temp (!) 97.1 F (36.2 C) (Temporal)   Resp 18   Ht 5\' 8"  (1.727 m)   Wt 184 lb (83.5 kg)   SpO2 96%   BMI 27.98 kg/m   Wt Readings from Last 3 Encounters:  12/28/19 184 lb (83.5 kg)  12/27/19 183 lb 9.6 oz (83.3 kg)  12/04/19 181 lb 4.8 oz (82.2 kg)

## 2020-01-02 ENCOUNTER — Other Ambulatory Visit: Payer: Self-pay

## 2020-01-02 ENCOUNTER — Ambulatory Visit: Payer: Medicare HMO | Admitting: Cardiovascular Disease

## 2020-01-02 ENCOUNTER — Encounter: Payer: Self-pay | Admitting: Cardiovascular Disease

## 2020-01-02 VITALS — BP 138/72 | HR 62 | Ht 68.0 in | Wt 185.6 lb

## 2020-01-02 DIAGNOSIS — I700 Atherosclerosis of aorta: Secondary | ICD-10-CM | POA: Diagnosis not present

## 2020-01-02 DIAGNOSIS — I2510 Atherosclerotic heart disease of native coronary artery without angina pectoris: Secondary | ICD-10-CM | POA: Diagnosis not present

## 2020-01-02 DIAGNOSIS — E039 Hypothyroidism, unspecified: Secondary | ICD-10-CM | POA: Diagnosis not present

## 2020-01-02 DIAGNOSIS — E2749 Other adrenocortical insufficiency: Secondary | ICD-10-CM | POA: Diagnosis not present

## 2020-01-02 DIAGNOSIS — E785 Hyperlipidemia, unspecified: Secondary | ICD-10-CM

## 2020-01-02 NOTE — Progress Notes (Signed)
Cardiology Office Note:    Date:  01/02/2020   ID:  [name], DOB [birth year], MRN [id number]  PCP:  [name], MD  Cardiologist:  Sanda Klein, MD  Electrophysiologist:  None   Referring MD: [name], *   Chief Complaint  Patient presents with  . Coronary Artery Disease    History of Present Illness:    [name] is a 74 y.o. male with a hx of coronary artery disease (occlusion RCA with left-to-right collaterals, occluded first diagonal, 80% stenosis ramus intermedius, 50% stenosis proximal RCA, minor irregularities left main and LAD by cardiac catheterization May 2010), normal perfusion and normal systolic function by nuclear stress test April 2018, hypertension, hyperlipidemia, frequent PVCs, small to moderate pericardial effusion (resolved), history of right lung lower lobectomy for lung cancer and history of right nephrectomy for clear cell kidney cancer, recently diagnosed with adrenal insufficiency and hypothyroidism, suspected panhypopituitarism.  He is doing remarkably well, after Rae Halsted immunotherapy and radiation therapy targeted to abdominal lymphadenopathy for his widely metastatic renal cancer (involving the lung and liver, omentum, retrocaval lymph nodes).  CT of the chest abdomen and pelvis shows extensive atherosclerosis in coronaries, aorta, aortic arch and abdominal and iliac vessels.  None of these are symptomatic.  He is not on lipid lowering therapy due to severe muscle weakness with statins. We chose to be less aggressive with lipid lowering therapy since his long term oncology prognosis appeared to be poor (which is no longer so clearly the case).  Past Medical History:  Diagnosis Date  . Arthritis   . Chronic kidney disease    only has one kidney   . 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 02/01/2015  . Coronary artery disease    followed by Dr.Tilley  . GERD (gastroesophageal reflux disease)   .  Heart murmur   . High cholesterol   . Hx of cancer of lung 1980's  . Hypertension   . Incisional hernia 08/01/2015  . Lung cancer ([place]) 1993  . Lung metastases ([place]) 2019  . Recurrent umbilical hernia 0/25/8527  . Right renal mass     Past Surgical History:  Procedure Laterality Date  . APPENDECTOMY  age 59  . CHOLECYSTECTOMY  1980's  . LAPAROSCOPIC LYSIS OF ADHESIONS N/A 08/01/2015   Procedure: LAPAROSCOPIC LYSIS OF ADHESIONS;  Surgeon: Michael Boston, MD;  Location: [facility];  Service: General;  Laterality: N/A;  . LUNG LOBECTOMY  1992   lung cancer- patient has staples in lung not to have MRI per patient   . PILONIDAL CYST EXCISION  2011   Dr Harlow Asa  . ROBOTIC ASSITED PARTIAL NEPHRECTOMY Right 02/01/2015   Procedure: RIGHT ROBOTIC ASSISTED LAPAROSOCOPY NEPHRECTOMY;  Surgeon: Ardis Hughs, MD;  Location: [facility];  Service: Urology;  Laterality: Right;  . VENTRAL HERNIA REPAIR N/A 08/01/2015   Procedure: LAPAROSCOPIC VENTRAL WALL HERNIA WITH MESH;  Surgeon: Michael Boston, MD;  Location: [facility];  Service: General;  Laterality: N/A;    Current Medications: Current Meds  Medication Sig  . acetaminophen (TYLENOL) 325 MG tablet Take 2 tablets (650 mg total) by mouth every 6 (six) hours as needed for mild pain or headache (over the counter).  Marland Kitchen aspirin 81 MG chewable tablet Chew 81 mg by mouth daily.  Marland Kitchen diltiazem (CARDIZEM CD) 120 MG 24 hr capsule   . famotidine (PEPCID) 20 MG tablet Take 20 mg by mouth 2 (two) times daily.  . hydrocortisone (CORTEF) 5 MG tablet Take  2 tablets in am and 1 tablet in pm, ~ 2pm  . levothyroxine (SYNTHROID) 150 MCG tablet Take 1 tablet (150 mcg total) by mouth daily.  . Multiple Vitamin (MULTIVITAMIN WITH MINERALS) TABS tablet Take 1 tablet by mouth daily.  . temazepam (RESTORIL) 7.5 MG capsule Take 1 capsule (7.5 mg total) by mouth at bedtime as needed for sleep.     Allergies:   Benadryl [diphenhydramine]   Social History   Socioeconomic History  .  Marital status: Married    Spouse name: Not on file  . Number of children: Not on file  . Years of education: Not on file  . Highest education level: Not on file  Occupational History  . Occupation: Retired  Tobacco Use  . Smoking status: Former Smoker    Packs/day: 1.00    Years: 20.00    Pack years: 20.00    Quit date: 01/29/1988    Years since quitting: 31.9  . Smokeless tobacco: Never Used  Substance and Sexual Activity  . Alcohol use: Yes    Comment: rare  . Drug use: No  . Sexual activity: Not on file  Other Topics Concern  . Not on file  Social History Narrative  . Not on file   Social Determinants of Health   Financial Resource Strain:   . Difficulty of Paying Living Expenses: Not on file  Food Insecurity:   . Worried About Charity fundraiser in the Last Year: Not on file  . Ran Out of Food in the Last Year: Not on file  Transportation Needs:   . Lack of Transportation (Medical): Not on file  . Lack of Transportation (Non-Medical): Not on file  Physical Activity:   . Days of Exercise per Week: Not on file  . Minutes of Exercise per Session: Not on file  Stress:   . Feeling of Stress : Not on file  Social Connections:   . Frequency of Communication with Friends and Family: Not on file  . Frequency of Social Gatherings with Friends and Family: Not on file  . Attends Religious Services: Not on file  . Active Member of Clubs or Organizations: Not on file  . Attends Archivist Meetings: Not on file  . Marital Status: Not on file     Family History: The patient's 30 of 5 brothers died of cancer and 1 of liver disease.  His father had coronary artery disease and his mother had Parkinson's disease ROS:   Please see the history of present illness.    All other systems are reviewed and are negative.  EKGs/Labs/Other Studies Reviewed:    The following studies were reviewed today:  Echocardiograms January and March 2020 Recent cancer center visit w  Dr. Alen Blew and last CT scan  EKG:  EKG is ordered today. It is a completely normal tracing, NSR.  Recent Labs: 12/04/2019: TSH 7.45 12/27/2019: ALT 12; BUN 17; Creatinine 1.49; Hemoglobin 11.8; Platelet Count 206; Potassium 4.1; Sodium 139  Recent Lipid Panel 12/04/16 CHOL TOTL 223 mg/dl, LDL N/A NM, HDL 19 mg/dl, TRIGLYCER 406 mg/dl and CHOL/HDL 11.9 (Calc)  Physical Exam:    VS:  BP 138/72   Pulse 62   Ht 5\' 8"  (1.727 m)   Wt 185 lb 9.6 oz (84.2 kg)   SpO2 94%   BMI 28.22 kg/m     Wt Readings from Last 3 Encounters:  01/02/20 185 lb 9.6 oz (84.2 kg)  12/28/19 184 lb (83.5 kg)  12/27/19 183  lb 9.6 oz (83.3 kg)      General: Alert, oriented x3, no distress, moderately overweight Head: no evidence of trauma, PERRL, EOMI, no exophtalmos or lid lag, no myxedema, no xanthelasma; normal ears, nose and oropharynx Neck: normal jugular venous pulsations and no hepatojugular reflux; brisk carotid pulses without delay and no carotid bruits Chest: clear to auscultation, no signs of consolidation by percussion or palpation, normal fremitus, symmetrical and full respiratory excursions Cardiovascular: normal position and quality of the apical impulse, regular rhythm, normal first and second heart sounds, no murmurs, rubs or gallops Abdomen: no tenderness or distention, no masses by palpation, no abnormal pulsatility or arterial bruits, normal bowel sounds, no hepatosplenomegaly Extremities: no clubbing, cyanosis or edema; 2+ radial, ulnar and brachial pulses bilaterally; 2+ right femoral, posterior tibial and dorsalis pedis pulses; 2+ left femoral, posterior tibial and dorsalis pedis pulses; no subclavian or femoral bruits Neurological: grossly nonfocal Psych: Normal mood and affect   ASSESSMENT:    1. Coronary artery disease involving native coronary artery of native heart without angina pectoris   2. Atherosclerosis of aorta ([place])   3. Hyperlipidemia LDL goal <70   4. Secondary  adrenal insufficiency ([place])   5. Hypothyroidism (acquired)    PLAN:    In order of problems listed above:   1. CAD: He does not have angina.  He is on aspirin.  He is statin intolerant.   2. Aortic atherosclerosis: Mild ectasia of the ascending aorta at 3.9 cm.  No intervention or periodic evaluation is planned at this point. 3. HLP: he does not want treatment at this time, but if he is still doing well from cancer point of view at his next appt, I believe we should be more aggressive with lipid lowering and start PCSK9 inhibitors.  4. Adrenal insufficiency/hypothyroidism: seeing Endocrinology, Dr. Cruzita Lederer. Would need stress dose steroids w serious illness   Medication Adjustments/Labs and Tests Ordered: Current medicines are reviewed at length with the patient today.  Concerns regarding medicines are outlined above.  Orders Placed This Encounter  Procedures  . EKG 12-Lead   No orders of the defined types were placed in this encounter.   Patient Instructions  Medication Instructions:  No changes *If you need a refill on your cardiac medications before your next appointment, please call your pharmacy*   Lab Work: None ordered If you have labs (blood work) drawn today and your tests are completely normal, you will receive your results only by: Marland Kitchen MyChart Message (if you have MyChart) OR . A paper copy in the mail If you have any lab test that is abnormal or we need to change your treatment, we will call you to review the results.   Testing/Procedures: None ordered   Follow-Up: At [facility], you and your health needs are our priority.  As part of our continuing mission to provide you with exceptional heart care, we have created designated Provider Care Teams.  These Care Teams include your primary Cardiologist (physician) and Advanced Practice Providers (APPs -  Physician Assistants and Nurse Practitioners) who all work together to provide you with the care you need, when  you need it.  We recommend signing up for the patient portal called "MyChart".  Sign up information is provided on this After Visit Summary.  MyChart is used to connect with patients for Virtual Visits (Telemedicine).  Patients are able to view lab/test results, encounter notes, upcoming appointments, etc.  Non-urgent messages can be sent to your provider as well.  To learn more about what you can do with MyChart, go to [email or web address].    Your next appointment:   12 month(s)  The format for your next appointment:   In Person  Provider:   You may see Sanda Klein, MD or one of the following Advanced Practice Providers on your designated Care Team:    Almyra Deforest, PA-C  Fabian Sharp, [place] or   Roby Lofts, PA-C      Signed, Sanda Klein, MD  01/02/2020 5:52 PM    [place]

## 2020-01-02 NOTE — Patient Instructions (Signed)

## 2020-01-23 ENCOUNTER — Other Ambulatory Visit: Payer: Self-pay | Admitting: Internal Medicine

## 2020-01-24 ENCOUNTER — Other Ambulatory Visit: Payer: Self-pay | Admitting: Internal Medicine

## 2020-01-24 ENCOUNTER — Other Ambulatory Visit (INDEPENDENT_AMBULATORY_CARE_PROVIDER_SITE_OTHER): Payer: Medicare HMO

## 2020-01-24 ENCOUNTER — Other Ambulatory Visit: Payer: Self-pay

## 2020-01-24 DIAGNOSIS — E039 Hypothyroidism, unspecified: Secondary | ICD-10-CM

## 2020-01-24 LAB — TSH: TSH: 6.91 u[IU]/mL — ABNORMAL HIGH (ref 0.35–4.50)

## 2020-01-24 LAB — T4, FREE: Free T4: 1.08 ng/dL (ref 0.60–1.60)

## 2020-01-24 MED ORDER — LEVOTHYROXINE SODIUM 175 MCG PO TABS: 175.0000 ug | ORAL_TABLET | Freq: Every day | ORAL | 3 refills | Status: DC

## 2020-03-06 ENCOUNTER — Other Ambulatory Visit: Payer: Self-pay

## 2020-03-06 ENCOUNTER — Other Ambulatory Visit (INDEPENDENT_AMBULATORY_CARE_PROVIDER_SITE_OTHER): Payer: Medicare HMO

## 2020-03-06 DIAGNOSIS — E039 Hypothyroidism, unspecified: Secondary | ICD-10-CM | POA: Diagnosis not present

## 2020-03-06 LAB — TSH: TSH: 2.48 u[IU]/mL (ref 0.35–4.50)

## 2020-03-06 LAB — T4, FREE: Free T4: 1.07 ng/dL (ref 0.60–1.60)

## 2020-03-21 DIAGNOSIS — H5203 Hypermetropia, bilateral: Secondary | ICD-10-CM | POA: Diagnosis not present

## 2020-03-21 DIAGNOSIS — H26493 Other secondary cataract, bilateral: Secondary | ICD-10-CM | POA: Diagnosis not present

## 2020-03-21 DIAGNOSIS — H524 Presbyopia: Secondary | ICD-10-CM | POA: Diagnosis not present

## 2020-03-21 DIAGNOSIS — H52223 Regular astigmatism, bilateral: Secondary | ICD-10-CM | POA: Diagnosis not present

## 2020-04-09 ENCOUNTER — Inpatient Hospital Stay: Payer: Medicare HMO | Attending: Oncology

## 2020-04-09 ENCOUNTER — Other Ambulatory Visit: Payer: Self-pay

## 2020-04-09 ENCOUNTER — Other Ambulatory Visit: Payer: Medicare HMO

## 2020-04-09 DIAGNOSIS — C772 Secondary and unspecified malignant neoplasm of intra-abdominal lymph nodes: Secondary | ICD-10-CM | POA: Insufficient documentation

## 2020-04-09 DIAGNOSIS — C641 Malignant neoplasm of right kidney, except renal pelvis: Secondary | ICD-10-CM | POA: Insufficient documentation

## 2020-04-09 LAB — CMP (CANCER CENTER ONLY)
ALT: 16 U/L (ref 0–44)
AST: 17 U/L (ref 15–41)
Albumin: 4.2 g/dL (ref 3.5–5.0)
Alkaline Phosphatase: 45 U/L (ref 38–126)
Anion gap: 9 (ref 5–15)
BUN: 17 mg/dL (ref 8–23)
CO2: 27 mmol/L (ref 22–32)
Calcium: 9.2 mg/dL (ref 8.9–10.3)
Chloride: 105 mmol/L (ref 98–111)
Creatinine: 1.35 mg/dL — ABNORMAL HIGH (ref 0.61–1.24)
GFR, Estimated: 54 mL/min — ABNORMAL LOW (ref >60)
Glucose, Bld: 101 mg/dL — ABNORMAL HIGH (ref 70–99)
Potassium: 4.3 mmol/L (ref 3.5–5.1)
Sodium: 141 mmol/L (ref 135–145)
Total Bilirubin: 0.5 mg/dL (ref 0.3–1.2)
Total Protein: 7.4 g/dL (ref 6.5–8.1)

## 2020-04-09 LAB — CBC WITH DIFFERENTIAL (CANCER CENTER ONLY)
Abs Immature Granulocytes: 0.02 10*3/uL (ref 0.00–0.07)
Basophils Absolute: 0 10*3/uL (ref 0.0–0.1)
Basophils Relative: 1 %
Eosinophils Absolute: 0.2 10*3/uL (ref 0.0–0.5)
Eosinophils Relative: 3 %
HCT: 38.3 % — ABNORMAL LOW (ref 39.0–52.0)
Hemoglobin: 13.1 g/dL (ref 13.0–17.0)
Immature Granulocytes: 0 %
Lymphocytes Relative: 22 %
Lymphs Abs: 1.3 10*3/uL (ref 0.7–4.0)
MCH: 30 pg (ref 26.0–34.0)
MCHC: 34.2 g/dL (ref 30.0–36.0)
MCV: 87.8 fL (ref 80.0–100.0)
Monocytes Absolute: 0.8 10*3/uL (ref 0.1–1.0)
Monocytes Relative: 13 %
Neutro Abs: 3.7 10*3/uL (ref 1.7–7.7)
Neutrophils Relative %: 61 %
Platelet Count: 202 10*3/uL (ref 150–400)
RBC: 4.36 MIL/uL (ref 4.22–5.81)
RDW: 12.6 % (ref 11.5–15.5)
WBC Count: 6.1 10*3/uL (ref 4.0–10.5)
nRBC: 0 % (ref 0.0–0.2)

## 2020-04-10 ENCOUNTER — Ambulatory Visit (HOSPITAL_COMMUNITY)
Admission: RE | Admit: 2020-04-10 | Discharge: 2020-04-10 | Disposition: A | Discharge status: Home / Self Care | Payer: Medicare HMO | Source: Ambulatory Visit | Attending: Oncology | Admitting: Oncology

## 2020-04-10 DIAGNOSIS — C641 Malignant neoplasm of right kidney, except renal pelvis: Secondary | ICD-10-CM | POA: Diagnosis not present

## 2020-04-10 DIAGNOSIS — N280 Ischemia and infarction of kidney: Secondary | ICD-10-CM | POA: Diagnosis not present

## 2020-04-10 DIAGNOSIS — I700 Atherosclerosis of aorta: Secondary | ICD-10-CM | POA: Diagnosis not present

## 2020-04-10 DIAGNOSIS — K7689 Other specified diseases of liver: Secondary | ICD-10-CM | POA: Diagnosis not present

## 2020-04-16 ENCOUNTER — Inpatient Hospital Stay: Payer: Medicare HMO | Attending: Oncology | Admitting: Oncology

## 2020-04-16 ENCOUNTER — Other Ambulatory Visit: Payer: Self-pay

## 2020-04-16 VITALS — BP 134/70 | HR 79 | Temp 97.7°F | Resp 15 | Ht 68.0 in | Wt 186.5 lb

## 2020-04-16 DIAGNOSIS — E230 Hypopituitarism: Secondary | ICD-10-CM | POA: Diagnosis not present

## 2020-04-16 DIAGNOSIS — C786 Secondary malignant neoplasm of retroperitoneum and peritoneum: Secondary | ICD-10-CM | POA: Diagnosis not present

## 2020-04-16 DIAGNOSIS — C787 Secondary malignant neoplasm of liver and intrahepatic bile duct: Secondary | ICD-10-CM | POA: Diagnosis not present

## 2020-04-16 DIAGNOSIS — C641 Malignant neoplasm of right kidney, except renal pelvis: Secondary | ICD-10-CM | POA: Diagnosis not present

## 2020-04-16 DIAGNOSIS — C772 Secondary and unspecified malignant neoplasm of intra-abdominal lymph nodes: Secondary | ICD-10-CM | POA: Diagnosis not present

## 2020-04-16 DIAGNOSIS — Z923 Personal history of irradiation: Secondary | ICD-10-CM | POA: Diagnosis not present

## 2020-04-16 NOTE — Progress Notes (Signed)
Hematology and Oncology Follow Up Visit  [name] [id number] March 25, 1945 75 y.o. 04/16/2020 9:58 AM [name], MDElkins, [name], *   Principle Diagnosis: 75 year old man with stage IV clear-cell renal cell carcinoma with hepatic and omental involvement documented in 2019.  He presented with localized disease diagnosed in 2016.    Prior Therapy: He underwent a radical nephrectomy completed by Dr. Louis Meckel on February 01, 2015.  The final pathology revealed a 3.3 cm clear cell renal cell carcinoma with Fuhrman grade 3.  Margins were negative indicating stage T1 a disease  He is status post omental biopsy in July 2019 which confirmed the presence of recurrent metastatic renal cell carcinoma.  Nivolumab at 3 mg/mg and ipilimumab at 1 mg/kg cycle 1 started on 09/21/2017.  He completed 4 cycles of therapy.   He did not receive any additional immunotherapy after developing adrenal insufficiency and panhypopituitarism.  He is status post radiation therapy to abdominal lymph node completed on November 08, 2019.  He received total of 30 Gy in 10 fractions  Current therapy: Active surveillance.  Interim History: [name] returns today for repeat evaluation.  Since the last visit, he reports no major changes in his health.  He denies any nausea, vomiting or abdominal pain.  He denies any excessive fatigue or tiredness.  Denies any bone pain or pathological fractures.  Continues to be active with activities of daily living.            .      Medications: Unchanged on review. Current Outpatient Medications  Medication Sig Dispense Refill  . acetaminophen (TYLENOL) 325 MG tablet Take 2 tablets (650 mg total) by mouth every 6 (six) hours as needed for mild pain or headache (over the counter). 30 tablet 3  . aspirin 81 MG chewable tablet Chew 81 mg by mouth daily.    Marland Kitchen diltiazem (CARDIZEM CD) 120 MG 24 hr capsule     . famotidine (PEPCID) 20 MG tablet Take 20 mg by mouth 2  (two) times daily.    . hydrocortisone (CORTEF) 5 MG tablet Take 2 tablets in am and 1 tablet in pm, ~ 2pm 300 tablet 3  . levothyroxine (SYNTHROID) 175 MCG tablet Take 1 tablet (175 mcg total) by mouth daily before breakfast. 45 tablet 3  . Multiple Vitamin (MULTIVITAMIN WITH MINERALS) TABS tablet Take 1 tablet by mouth daily. 30 tablet 3  . temazepam (RESTORIL) 7.5 MG capsule Take 1 capsule (7.5 mg total) by mouth at bedtime as needed for sleep. 30 capsule 0   No current facility-administered medications for this visit.     Allergies:  Allergies  Allergen Reactions  . Benadryl [Diphenhydramine] Other (See Comments)    Severe restless legs        Physical Exam:    Blood pressure 134/70, pulse 79, temperature 97.7 F (36.5 C), temperature source Tympanic, resp. rate 15, height 5\' 8"  (1.727 m), weight 186 lb 8 oz (84.6 kg), SpO2 98 %.        ECOG: 1    General appearance: Alert, awake without any distress. Head: Atraumatic without abnormalities Oropharynx: Without any thrush or ulcers. Eyes: No scleral icterus. Lymph nodes: No lymphadenopathy noted in the cervical, supraclavicular, or axillary nodes Heart:regular rate and rhythm, without any murmurs or gallops.   Lung: Clear to auscultation without any rhonchi, wheezes or dullness to percussion. Abdomin: Soft, nontender without any shifting dullness or ascites. Musculoskeletal: No clubbing or cyanosis. Neurological: No motor or sensory deficits. Skin:  No rashes or lesions.             Lab Results: Lab Results  Component Value Date   WBC 6.1 04/09/2020   HGB 13.1 04/09/2020   HCT 38.3 (L) 04/09/2020   MCV 87.8 04/09/2020   PLT 202 04/09/2020     Chemistry      Component Value Date/Time   NA 141 04/09/2020 1047   NA 134 03/11/2018 0913   K 4.3 04/09/2020 1047   CL 105 04/09/2020 1047   CO2 27 04/09/2020 1047   BUN 17 04/09/2020 1047   BUN 17 03/11/2018 0913   CREATININE 1.35 (H) 04/09/2020  1047      Component Value Date/Time   CALCIUM 9.2 04/09/2020 1047   ALKPHOS 45 04/09/2020 1047   AST 17 04/09/2020 1047   ALT 16 04/09/2020 1047   BILITOT 0.5 04/09/2020 1047      IMPRESSION: 1. Mild interval increase in size of retroperitoneal lymph node position between the IVC and pancreatic head. Increased size may relate to inflammation from radiation treatment. 2. Stable focus of omental thickening in the LEFT ventral peritoneal space. 3. No evidence of new metastatic disease. 4. Stable post RIGHT nephrectomy bed with extensive fat necrosis.   Impression and Plan:  75 year old man with:   1.  Kidney cancer diagnosed in 2016.  He subsequently developed stage IV clear-cell renal cell with hepatic and peritoneal metastasis in 2019.   His disease status was updated today and imaging studies from February 23 were reviewed.  He had an excellent response to immunotherapy with very little residual disease including mild retroperitoneal adenopathy that has been radiated.  Treatment options moving forward were discussed.  Continued observation remains a reasonable option for the very limited to no disease left at this time.  Different salvage therapy options including oral targeted therapy alone or in combination with immunotherapy were discussed.  For the time being plan is to repeat imaging studies in 6 months for continued active surveillance.    2.  Immune mediated complications: He continues to be on thyroid and hydrocortisone replacement monitor autoimmune complications.  No other autoimmune issues reported.  3.  Weight loss: No issues reported at this time.  His weight and appetite is improved.  4.  Prognosis and goals of care: Therapy remains palliative although aggressive measures are warranted given his excellent performance status.  5.  Follow-up: In 6 months for repeat evaluation.  30  minutes were dedicated to this visit.  The time was spent on reviewing imaging  studies, treatment options and outlining future plan of care.   Zola Button, MD 3/1/20229:58 AM

## 2020-04-18 ENCOUNTER — Telehealth: Payer: Self-pay | Admitting: Oncology

## 2020-04-18 NOTE — Telephone Encounter (Signed)
Scheduled per 03/01 los, called and spoke with patient's daughter. Patient will be notified of upcoming appointments.

## 2020-06-04 ENCOUNTER — Encounter: Payer: Self-pay | Admitting: Internal Medicine

## 2020-06-05 ENCOUNTER — Other Ambulatory Visit: Payer: Self-pay

## 2020-06-05 ENCOUNTER — Encounter: Payer: Self-pay | Admitting: Internal Medicine

## 2020-06-05 ENCOUNTER — Telehealth: Payer: Self-pay

## 2020-06-05 ENCOUNTER — Ambulatory Visit: Payer: Medicare HMO | Admitting: Internal Medicine

## 2020-06-05 VITALS — BP 110/70 | HR 72 | Ht 68.0 in | Wt 186.2 lb

## 2020-06-05 DIAGNOSIS — E2749 Other adrenocortical insufficiency: Secondary | ICD-10-CM | POA: Diagnosis not present

## 2020-06-05 DIAGNOSIS — E039 Hypothyroidism, unspecified: Secondary | ICD-10-CM | POA: Diagnosis not present

## 2020-06-05 LAB — T4, FREE: Free T4: 1.24 ng/dL (ref 0.60–1.60)

## 2020-06-05 LAB — TSH: TSH: 0.27 u[IU]/mL — ABNORMAL LOW (ref 0.35–4.50)

## 2020-06-05 MED ORDER — LEVOTHYROXINE SODIUM 150 MCG PO TABS: 150.0000 ug | ORAL_TABLET | ORAL | 3 refills | Status: DC

## 2020-06-05 MED ORDER — HYDROCORTISONE 5 MG PO TABS: ORAL_TABLET | ORAL | 3 refills | Status: DC

## 2020-06-05 MED ORDER — LEVOTHYROXINE SODIUM 175 MCG PO TABS: 175.0000 ug | ORAL_TABLET | ORAL | 3 refills | Status: DC

## 2020-06-05 NOTE — Patient Instructions (Signed)
Please continueHydrocortisone to 10 mg in a and 5 mg in the pm.  - You absolutely need to take this medication every day and not skip doses. - Please double the dose if you have a fever, for the duration of the fever. - If you cannot take anything by mouth (vomiting) or you have severe diarrhea so that you eliminate the hydrocortisone pills in your stool, please make sure that you get the hydrocortisone in the vein instead - go to the nearest emergency department/urgent care or you may go to your PCPs office  - Please try to get a MedAlert bracelet or pendant indicating: "Adrenal insufficiency".  Please continue: - Levothyroxine 175 mcg daily.  Take the thyroid hormone every day, with water, at least 30 minutes before breakfast, separated by at least 4 hours from: - acid reflux medications - calcium - iron - multivitamins  Please stop at the lab.  Please come back for a follow-up appointment in 6 months.

## 2020-06-05 NOTE — Telephone Encounter (Addendum)
Called and left a detailed message advising pt of results. Pt advised to call to schedule lab appointment. ----- Message from Philemon Kingdom, MD sent at 06/05/2020 12:30 PM EDT ----- Can you please call pt.:  TSH is now suppressed >> we will decrease the dose of levothyroxine to 150 alternating with 175 every other day.  We will need to recheck his test in 1.5 months. Labs are in. I refilled both doses.

## 2020-06-05 NOTE — Progress Notes (Signed)
Patient ID: [name], male   DOB: April 21, 1945, 75 y.o.   MRN: [id number]   This visit occurred during the SARS-CoV-2 public health emergency.  Safety protocols were in place, including screening questions prior to the visit, additional usage of staff PPE, and extensive cleaning of exam room while observing appropriate contact time as indicated for disinfecting solutions.   HPI  [name] is a 75 y.o.-year-old male, returning for follow-up for hypothyroidism and adrenal insufficiency.  Last OV 6 months ago.  Interim history: He finished RxTx since last OV. He had an episode of fatigue 3 days ago >> resolved.  He has no complaints otherwise except for insomnia.  Reviewed history: Patient was admitted for weakness on 03/13/2018 with mild hyponatremia of 129.  However, he was recently admitted on 03/24/2018 with severe hyponatremia, at 115.  At that time, he was found to be profoundly hypothyroid and also adrenal insufficiency.  He was started on hormonal replacement for both conditions and his sodium was slowly repeated.  His sodium improved to normal before discharge.  He was sent home with levothyroxine and hydrocortisone.  He was advised to see endocrinology in 1 month.  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 He had recurrence in 09/2017 >> metastases in Lung, Liver, and L kidney. He is on ipilimumab Curt Bears) and Nivolumab (Opdivo) - started 09/2017, continued q3 weeks x 4 >> stopped after developing weakness in ~12/2017.  Reviewing his oncologist note, he will continue with p.o. Biologics if needed after he has a new CT scan.  He finished RxTx in 10/2019.  Primary Hypothyroidism: He was diagnosed with hypothyroidism in 03/2018 and started on levothyroxine. We subsequently increased the dose up to 175 mcg daily, last dose change 01/2020.  TFTs normalized on this dose.  He takes levothyroxine: - misses it maybe 1x a month -  in am - fasting - at least 30 min from b'fast - no Ca, Fe, + MVI at night - stopped PPI - + Pepcid later in the day - not on Biotin  Reviewed his TFTs: Lab Results  Component Value Date   TSH 2.48 03/06/2020   TSH 6.91 (H) 01/24/2020   TSH 7.45 (H) 12/04/2019   TSH 0.98 06/01/2019   TSH 2.15 04/20/2019   TSH 4.24 12/06/2018   TSH 5.47 (H) 10/31/2018   TSH 12.01 (H) 09/14/2018   TSH 34.68 (H) 08/03/2018   TSH 64.97 (H) 04/19/2018   FREET4 1.07 03/06/2020   FREET4 1.08 01/24/2020   FREET4 0.89 12/04/2019   FREET4 1.18 06/01/2019   FREET4 1.04 04/20/2019   FREET4 1.01 12/06/2018   FREET4 1.00 10/31/2018   FREET4 0.92 09/14/2018   FREET4 0.73 08/03/2018   FREET4 0.37 (L) 04/19/2018   T3FREE 0.5 (L) 03/24/2018   Received labs from PCP, on 10/19/2018: CBC with low hemoglobin, at 12.5 CMP with high glucose at 178, BUN/creatinine 18/1.78, GFR 37, otherwise normal TSH 2.64, free T4 1.35  Antithyroid antibodies: No results found for: THGAB No components found for: TPOAB  Pt denies: - feeling nodules in neck - hoarseness - dysphagia - choking - SOB with lying down  No FH of thyroid disease or cancer. No h/o radiation tx to head or neck.  No seaweed or kelp. No recent contrast studies. No herbal supplements. No Biotin use. No recent steroids use.   Adrenal insufficiency:  He was found to have a low cortisol, of 2.4 on  03/24/2018.    A cosyntropin stimulation test was abnormal the next day. Component     Latest Ref Rng & Units 03/24/2018 03/25/2018  Cortisol, Base     ug/dL  2.2  Cortisol, 30 Min     ug/dL  6.9  Cortisol, 60 Min     ug/dL  8.6  Cortisol, Plasma     ug/dL 2.4    He had significant fatigue, weakness, before starting hydrocortisone.  He was started on a higher dose, 20 mg a.m. and 10 mg in a.m. and he felt much better immediately afterwards.  We were able to subsequently decrease the dose to 10 mg in a.m. and 5 mg in p.m. however, he did misunderstood  instructions and was taking only 5 mg twice a day.  He did develop fatigue on this dose so we increased the dose back to 10 mg in a.m. and 5 mg in p.m..  He continues on this dose now.  He denies unintentional weight loss, nausea, weakness, dizziness, headaches. He has fatigue and decreased appetite, improved after finishing RxTx.  He did not have to double up on the dose of hydrocortisone or take this IV/IM since last visit.  Hyponatremia: -Developed in 02/2018 -Most likely secondary to uncontrolled central adrenal insufficiency and hypothyroidism  Reviewed his sodium levels: Lab Results  Component Value Date   NA 141 04/09/2020   NA 139 12/27/2019   NA 143 08/03/2019   NA 145 04/05/2019   NA 144 12/30/2018   NA 140 08/30/2018   NA 142 05/17/2018   NA 137 04/08/2018   NA 135 03/28/2018   NA 135 03/27/2018   NA 129 (L) 03/26/2018   NA 124 (L) 03/25/2018   NA 123 (L) 03/25/2018   NA 120 (L) 03/25/2018   NA 117 (LL) 03/24/2018   NA 116 (LL) 03/24/2018   NA 115 (LL) 03/24/2018   NA 116 (L) 03/24/2018   NA 120 (L) 03/22/2018   NA 136 03/16/2018   NA 134 (L) 03/15/2018   NA 131 (L) 03/14/2018   NA 129 (L) 03/13/2018   NA 134 03/11/2018   NA 136 03/04/2018   NA 135 02/10/2018   NA 141 01/26/2018   NA 139 01/12/2018   NA 137 01/06/2018   NA 140 12/30/2017   He had lung CA in 1993-1994.  He quit smoking in 1990  ROS: Constitutional: no weight gain/no weight loss, + fatigue, no subjective hyperthermia, no subjective hypothermia Eyes: no blurry vision, no xerophthalmia ENT: no sore throat, + see HPI Cardiovascular: no CP/no SOB/no palpitations/no leg swelling Respiratory: no cough/no SOB/no wheezing Gastrointestinal: no N/no V/no D/no C/no acid reflux Musculoskeletal: no muscle aches/no joint aches Skin: no rashes, no hair loss Neurological: no tremors/no numbness/no tingling/no dizziness  I reviewed pt's medications, allergies, PMH, social hx, family hx, and changes  were documented in the history of present illness. Otherwise, unchanged from my initial visit note.  Past Medical History:  Diagnosis Date  . Arthritis   . Chronic kidney disease    only has one kidney   . 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 02/01/2015  . Coronary artery disease    followed by Dr.Tilley  . GERD (gastroesophageal reflux disease)   . Heart murmur   . High cholesterol   . Hx of cancer of lung 1980's  . Hypertension   . Incisional hernia 08/01/2015  . Lung cancer ([place]) 1993  . Lung metastases ([place]) 2019  . Recurrent  umbilical hernia 09/26/9145  . Right renal mass    Past Surgical History:  Procedure Laterality Date  . APPENDECTOMY  age 58  . CHOLECYSTECTOMY  1980's  . LAPAROSCOPIC LYSIS OF ADHESIONS N/A 08/01/2015   Procedure: LAPAROSCOPIC LYSIS OF ADHESIONS;  Surgeon: Michael Boston, MD;  Location: [facility];  Service: General;  Laterality: N/A;  . LUNG LOBECTOMY  1992   lung cancer- patient has staples in lung not to have MRI per patient   . PILONIDAL CYST EXCISION  2011   Dr Harlow Asa  . ROBOTIC ASSITED PARTIAL NEPHRECTOMY Right 02/01/2015   Procedure: RIGHT ROBOTIC ASSISTED LAPAROSOCOPY NEPHRECTOMY;  Surgeon: Ardis Hughs, MD;  Location: [facility];  Service: Urology;  Laterality: Right;  . VENTRAL HERNIA REPAIR N/A 08/01/2015   Procedure: LAPAROSCOPIC VENTRAL WALL HERNIA WITH MESH;  Surgeon: Michael Boston, MD;  Location: [facility];  Service: General;  Laterality: N/A;   Social History   Socioeconomic History  . Marital status: Married    Spouse name: Not on file  . Number of children: Not on file  . Years of education: Not on file  . Highest education level: Not on file  Occupational History  . Occupation: Retired  Tobacco Use  . Smoking status: Former Smoker    Packs/day: 1.00    Years: 20.00    Pack years: 20.00    Quit date: 01/29/1988    Years since quitting: 32.3  . Smokeless tobacco: Never Used  Substance and Sexual Activity   . Alcohol use: Yes    Comment: rare  . Drug use: No  . Sexual activity: Not on file  Other Topics Concern  . Not on file  Social History Narrative  . Not on file   Social Determinants of Health   Financial Resource Strain: Not on file  Food Insecurity: Not on file  Transportation Needs: Not on file  Physical Activity: Not on file  Stress: Not on file  Social Connections: Not on file  Intimate Partner Violence: Not on file   Current Outpatient Medications on File Prior to Visit  Medication Sig Dispense Refill  . acetaminophen (TYLENOL) 325 MG tablet Take 2 tablets (650 mg total) by mouth every 6 (six) hours as needed for mild pain or headache (over the counter). 30 tablet 3  . aspirin 81 MG chewable tablet Chew 81 mg by mouth daily.    Marland Kitchen diltiazem (CARDIZEM CD) 120 MG 24 hr capsule     . famotidine (PEPCID) 20 MG tablet Take 20 mg by mouth 2 (two) times daily.    . hydrocortisone (CORTEF) 5 MG tablet Take 2 tablets in am and 1 tablet in pm, ~ 2pm 300 tablet 3  . levothyroxine (SYNTHROID) 175 MCG tablet Take 1 tablet (175 mcg total) by mouth daily before breakfast. 45 tablet 3  . Multiple Vitamin (MULTIVITAMIN WITH MINERALS) TABS tablet Take 1 tablet by mouth daily. 30 tablet 3  . temazepam (RESTORIL) 7.5 MG capsule Take 1 capsule (7.5 mg total) by mouth at bedtime as needed for sleep. 30 capsule 0   No current facility-administered medications on file prior to visit.   Allergies  Allergen Reactions  . Benadryl [Diphenhydramine] Other (See Comments)    Severe restless legs   Family History  Problem Relation Age of Onset  . Heart attack Father    Cardiologist: Dr. Sallyanne Kuster.  PE: BP 110/70 (BP Location: Left Arm, Patient Position: Sitting, Cuff Size: Normal)   Pulse 72   Ht 5\' 8"  (1.727  m)   Wt 186 lb 3.2 oz (84.5 kg)   SpO2 94%   BMI 28.31 kg/m  Wt Readings from Last 3 Encounters:  06/05/20 186 lb 3.2 oz (84.5 kg)  04/16/20 186 lb 8 oz (84.6 kg)  01/02/20 185 lb  9.6 oz (84.2 kg)   Constitutional: overweight, in NAD Eyes: PERRLA, EOMI, no exophthalmos ENT: moist mucous membranes, no thyromegaly, no cervical lymphadenopathy Cardiovascular: RRR, No MRG Respiratory: CTA B Gastrointestinal: abdomen soft, NT, ND, BS+ Musculoskeletal: no deformities, strength intact in all 4 Skin: moist, warm, no rashes Neurological: no tremor with outstretched hands, DTR normal in all 4  ASSESSMENT: 1. Primary Hypothyroidism  2. Central Adrenal Insufficiency  3. Hyponatremia -Low sodium: 115 during his admission from 03/2018, likely due to uncontrolled hypothyroidism and central adrenal insufficiency, which are not treated -Sodium normalized afterwards.  PLAN:  1. Patient with primary hypothyroidism, on levothyroxine -Likely secondary to ipilimumab -He was admitted in 03/2018 and the TSH was found to be 86.  He was initially given levothyroxine IV, then 50 mcg p.o.  We increased the dose afterwards, currently on 175 mcg daily, increased in 01/2020. - latest thyroid labs reviewed with pt >> finally normal: Lab Results  Component Value Date   TSH 2.48 03/06/2020  - pt feels good on this dose. - we discussed about taking the thyroid hormone every day, with water, >30 minutes before breakfast, separated by >4 hours from acid reflux medications, calcium, iron, multivitamins. Pt. is taking it correctly. - will check thyroid tests today: TSH and fT4 - If labs are abnormal, he will need to return for repeat TFTs in 1.5 months  2. Central Adrenal Insufficiency -Also likely secondary to ipilimumab -She did not have to double the dose of hydrocortisone or take it parenterally since last visit. -She was previously on hydrocortisone 20 mg in a.m. and 10 mg in p.m. and I advised him to decrease the dose to a more physiologic 10 mg a.m. and 5 mg in p.m.  He misunderstood instructions and was only taking 5 mg twice a day but we continued this dose since he did not have  fatigue, dizziness, nausea, abdominal pain.  However, in the last year, he did have more fatigue, especially during his radiation therapy and we increased his hydrocortisone dose to 10 mg in a.m. and 5 mg in p.m., which he continues today. -I did advise him to get a medical alert bracelet >> he now has one mentioning "adrenal insufficiency" -We again discussed about sick day rules:  If you cannot keep anything down, including your hydrocortisone medication, please go to the emergency room or your primary care physician office to get steroids injected in the muscle or vein.   If you have a fever (more than 100 Fahrenheit) or gastroenteritis with nausea/vomiting and diarrhea, please double the dose of your hydrocortisone for the duration of the fever or the gastroenteritis.  Do not run out of your hydrocortisone medication.  Needs refills LT4 90 days. Component     Latest Ref Rng & Units 06/05/2020          TSH     0.35 - 4.50 uIU/mL 0.27 (L)  T4,Free(Direct)     0.60 - 1.60 ng/dL 1.24  TSH is now suppressed >> we will decrease the dose of levothyroxine to 150 alternating with 175 every other day.  We will recheck his test in 1.5 months.  Philemon Kingdom, MD PhD [facility] Endocrinology

## 2020-07-08 DIAGNOSIS — L814 Other melanin hyperpigmentation: Secondary | ICD-10-CM | POA: Diagnosis not present

## 2020-07-08 DIAGNOSIS — D2239 Melanocytic nevi of other parts of face: Secondary | ICD-10-CM | POA: Diagnosis not present

## 2020-07-08 DIAGNOSIS — L918 Other hypertrophic disorders of the skin: Secondary | ICD-10-CM | POA: Diagnosis not present

## 2020-07-08 DIAGNOSIS — L820 Inflamed seborrheic keratosis: Secondary | ICD-10-CM | POA: Diagnosis not present

## 2020-07-08 DIAGNOSIS — D485 Neoplasm of uncertain behavior of skin: Secondary | ICD-10-CM | POA: Diagnosis not present

## 2020-07-08 DIAGNOSIS — D225 Melanocytic nevi of trunk: Secondary | ICD-10-CM | POA: Diagnosis not present

## 2020-07-24 DIAGNOSIS — M79641 Pain in right hand: Secondary | ICD-10-CM | POA: Diagnosis not present

## 2020-07-24 DIAGNOSIS — G5601 Carpal tunnel syndrome, right upper limb: Secondary | ICD-10-CM | POA: Diagnosis not present

## 2020-07-24 DIAGNOSIS — M65331 Trigger finger, right middle finger: Secondary | ICD-10-CM | POA: Diagnosis not present

## 2020-07-25 DIAGNOSIS — C441121 Basal cell carcinoma of skin of right upper eyelid, including canthus: Secondary | ICD-10-CM | POA: Diagnosis not present

## 2020-08-02 ENCOUNTER — Telehealth: Payer: Self-pay | Admitting: *Deleted

## 2020-08-02 NOTE — Telephone Encounter (Signed)
   [facility] Pre-operative Risk Assessment    Patient Name: [name]  DOB: [birth year]  MRN: [id number]    Request for surgical clearance:  What type of surgery is being performed? Right CTR and middle finger trigger release   When is this surgery scheduled? 09/17/20   What type of clearance is required (medical clearance vs. Pharmacy clearance to hold med vs. Both)? both  Are there any medications that need to be held prior to surgery and how long?ASA   Practice name and name of physician performing surgery? [facility] Dr. Amedeo Plenty   What is the office phone number? [phone]   7.   What is the office fax number? [phone] attn: Sherry  8.   Anesthesia type (None, local, MAC, general) ?    Aundrea Horace A Rim Thatch 08/02/2020, 4:45 PM  _________________________________________________________________   (provider comments below)

## 2020-08-05 NOTE — Telephone Encounter (Signed)
Hi Dr. Sandi Raveling. [name] has upcoming CTR and middle finger trigger release planned in August and is being asked to hold Aspirin. He has a history of CAD with known occlusion of RCA with collaterals and 90% stenosis of ramus intermedius on remote cardiac catheterization in 2010. Nuclear stress test in 2018 sowed normal perfusion. You last saw him in 12/2019 at which time he was doing well from a cardiac standpoint. Can you please comment on how long Aspirin can be held for this upcoming procedure?  Please route response back to P CV DIV PREOP.  Thank you! Retha Bither

## 2020-08-13 NOTE — Telephone Encounter (Signed)
Will forward back to pre op pool for final notes from pre op provider

## 2020-08-13 NOTE — Telephone Encounter (Signed)
Left message with daughter. He will call back.

## 2020-08-14 NOTE — Telephone Encounter (Signed)
   Name: [name]  DOB: March 21, 1945  MRN: [id number]   Primary Cardiologist: Sanda Klein, MD  Chart reviewed as part of pre-operative protocol coverage. Patient was contacted 08/14/2020 in reference to pre-operative risk assessment for pending surgery as outlined below.  [name] was last seen on 01/02/20 by Dr. Sallyanne Kuster.  Since that day, [name] has done well.  He can complete more than 4.0 METS without angina.   Per Dr. Sallyanne Kuster: OK to hold ASA 5-7 days prior to surgery.   Therefore, based on ACC/AHA guidelines, the patient would be at acceptable risk for the planned procedure without further cardiovascular testing.   The patient was advised that if he develops new symptoms prior to surgery to contact our office to arrange for a follow-up visit, and he verbalized understanding.  I will route this recommendation to the requesting party via Epic fax function and remove from pre-op pool. Please call with questions.  Tami Lin Raynaldo Falco, PA 08/14/2020, 10:13 AM

## 2020-09-17 DIAGNOSIS — M65331 Trigger finger, right middle finger: Secondary | ICD-10-CM | POA: Diagnosis not present

## 2020-09-17 DIAGNOSIS — G5601 Carpal tunnel syndrome, right upper limb: Secondary | ICD-10-CM | POA: Diagnosis not present

## 2020-10-01 DIAGNOSIS — M79641 Pain in right hand: Secondary | ICD-10-CM | POA: Diagnosis not present

## 2020-10-15 ENCOUNTER — Other Ambulatory Visit: Payer: Medicare HMO

## 2020-10-16 ENCOUNTER — Inpatient Hospital Stay: Payer: Medicare HMO | Attending: Oncology

## 2020-10-16 ENCOUNTER — Ambulatory Visit (HOSPITAL_COMMUNITY)
Admission: RE | Admit: 2020-10-16 | Discharge: 2020-10-16 | Disposition: A | Discharge status: Home / Self Care | Payer: Medicare HMO | Source: Ambulatory Visit | Attending: Oncology | Admitting: Oncology

## 2020-10-16 ENCOUNTER — Encounter (HOSPITAL_COMMUNITY): Payer: Self-pay

## 2020-10-16 ENCOUNTER — Other Ambulatory Visit: Payer: Self-pay

## 2020-10-16 DIAGNOSIS — C786 Secondary malignant neoplasm of retroperitoneum and peritoneum: Secondary | ICD-10-CM | POA: Diagnosis not present

## 2020-10-16 DIAGNOSIS — C649 Malignant neoplasm of unspecified kidney, except renal pelvis: Secondary | ICD-10-CM | POA: Insufficient documentation

## 2020-10-16 DIAGNOSIS — C641 Malignant neoplasm of right kidney, except renal pelvis: Secondary | ICD-10-CM

## 2020-10-16 DIAGNOSIS — R911 Solitary pulmonary nodule: Secondary | ICD-10-CM | POA: Diagnosis not present

## 2020-10-16 DIAGNOSIS — K7689 Other specified diseases of liver: Secondary | ICD-10-CM | POA: Diagnosis not present

## 2020-10-16 LAB — CBC WITH DIFFERENTIAL (CANCER CENTER ONLY)
Abs Immature Granulocytes: 0.02 10*3/uL (ref 0.00–0.07)
Basophils Absolute: 0 10*3/uL (ref 0.0–0.1)
Basophils Relative: 0 %
Eosinophils Absolute: 0.2 10*3/uL (ref 0.0–0.5)
Eosinophils Relative: 3 %
HCT: 37.6 % — ABNORMAL LOW (ref 39.0–52.0)
Hemoglobin: 12.9 g/dL — ABNORMAL LOW (ref 13.0–17.0)
Immature Granulocytes: 0 %
Lymphocytes Relative: 30 %
Lymphs Abs: 1.7 10*3/uL (ref 0.7–4.0)
MCH: 30.1 pg (ref 26.0–34.0)
MCHC: 34.3 g/dL (ref 30.0–36.0)
MCV: 87.6 fL (ref 80.0–100.0)
Monocytes Absolute: 0.7 10*3/uL (ref 0.1–1.0)
Monocytes Relative: 12 %
Neutro Abs: 3.2 10*3/uL (ref 1.7–7.7)
Neutrophils Relative %: 55 %
Platelet Count: 202 10*3/uL (ref 150–400)
RBC: 4.29 MIL/uL (ref 4.22–5.81)
RDW: 12.5 % (ref 11.5–15.5)
WBC Count: 5.7 10*3/uL (ref 4.0–10.5)
nRBC: 0 % (ref 0.0–0.2)

## 2020-10-16 LAB — CMP (CANCER CENTER ONLY)
ALT: 14 U/L (ref 0–44)
AST: 14 U/L — ABNORMAL LOW (ref 15–41)
Albumin: 4.3 g/dL (ref 3.5–5.0)
Alkaline Phosphatase: 49 U/L (ref 38–126)
Anion gap: 10 (ref 5–15)
BUN: 20 mg/dL (ref 8–23)
CO2: 28 mmol/L (ref 22–32)
Calcium: 9.7 mg/dL (ref 8.9–10.3)
Chloride: 104 mmol/L (ref 98–111)
Creatinine: 1.55 mg/dL — ABNORMAL HIGH (ref 0.61–1.24)
GFR, Estimated: 46 mL/min — ABNORMAL LOW (ref >60)
Glucose, Bld: 90 mg/dL (ref 70–99)
Potassium: 4.3 mmol/L (ref 3.5–5.1)
Sodium: 142 mmol/L (ref 135–145)
Total Bilirubin: 0.4 mg/dL (ref 0.3–1.2)
Total Protein: 7.7 g/dL (ref 6.5–8.1)

## 2020-10-22 ENCOUNTER — Inpatient Hospital Stay: Payer: Medicare HMO | Attending: Oncology | Admitting: Oncology

## 2020-10-22 ENCOUNTER — Other Ambulatory Visit: Payer: Self-pay

## 2020-10-22 VITALS — BP 137/72 | HR 75 | Temp 98.1°F | Resp 18 | Ht 68.0 in | Wt 186.8 lb

## 2020-10-22 DIAGNOSIS — E230 Hypopituitarism: Secondary | ICD-10-CM | POA: Diagnosis not present

## 2020-10-22 DIAGNOSIS — C641 Malignant neoplasm of right kidney, except renal pelvis: Secondary | ICD-10-CM

## 2020-10-22 DIAGNOSIS — C787 Secondary malignant neoplasm of liver and intrahepatic bile duct: Secondary | ICD-10-CM | POA: Insufficient documentation

## 2020-10-22 DIAGNOSIS — E2740 Unspecified adrenocortical insufficiency: Secondary | ICD-10-CM | POA: Insufficient documentation

## 2020-10-22 DIAGNOSIS — Z923 Personal history of irradiation: Secondary | ICD-10-CM | POA: Insufficient documentation

## 2020-10-22 DIAGNOSIS — Z905 Acquired absence of kidney: Secondary | ICD-10-CM | POA: Insufficient documentation

## 2020-10-22 DIAGNOSIS — I10 Essential (primary) hypertension: Secondary | ICD-10-CM | POA: Diagnosis not present

## 2020-10-22 DIAGNOSIS — C649 Malignant neoplasm of unspecified kidney, except renal pelvis: Secondary | ICD-10-CM | POA: Insufficient documentation

## 2020-10-22 DIAGNOSIS — C786 Secondary malignant neoplasm of retroperitoneum and peritoneum: Secondary | ICD-10-CM | POA: Insufficient documentation

## 2020-10-22 NOTE — Progress Notes (Signed)
Hematology and Oncology Follow Up Visit  [name] [id number] 1945-11-04 75 y.o. 10/22/2020 10:04 AM [name], MDElkins, [name], *   Principle Diagnosis: 75 year old man with kidney cancer diagnosed in 2016.  He developed stage IV clear-cell renal cell carcinoma with hepatic and omental involvement documented in 2019.    Prior Therapy: He underwent a radical nephrectomy completed by Dr. Louis Perry on February 01, 2015.  The final pathology revealed a 3.3 cm clear cell renal cell carcinoma with Fuhrman grade 3.  Margins were negative indicating stage T1 a disease  He is status post omental biopsy in July 2019 which confirmed the presence of recurrent metastatic renal cell carcinoma.  Nivolumab at 3 mg/mg and ipilimumab at 1 mg/kg cycle 1 started on 09/21/2017.  He completed 4 cycles of therapy.   He did not receive any additional immunotherapy after developing adrenal insufficiency and panhypopituitarism.  He is status post radiation therapy to abdominal lymph node completed on November 08, 2019.  He received total of 30 Gy in 10 fractions  Current therapy: Active surveillance.  Interim History: [name] returns today for a follow-up visit.  Since her last visit, he reports feeling well without any major complaints.  He denies any nausea vomiting abdominal pain.  He denies any hospitalizations or illnesses.  His performance status quality of life remained excellent.  He continues to be on Cortef and thyroid replacement at this time.            .      Medications: Updated on review. Current Outpatient Medications  Medication Sig Dispense Refill   acetaminophen (TYLENOL) 325 MG tablet Take 2 tablets (650 mg total) by mouth every 6 (six) hours as needed for mild pain or headache (over the counter). 30 tablet 3   aspirin 81 MG chewable tablet Chew 81 mg by mouth daily.     diltiazem (CARDIZEM CD) 120 MG 24 hr capsule      famotidine (PEPCID) 20 MG tablet Take 20  mg by mouth 2 (two) times daily.     hydrocortisone (CORTEF) 5 MG tablet Take 2 tablets in am and 1 tablet in pm, ~ 2pm. 300 tablet 3   levothyroxine (SYNTHROID) 150 MCG tablet Take 1 tablet (150 mcg total) by mouth every other day. 30 tablet 3   levothyroxine (SYNTHROID) 175 MCG tablet Take 1 tablet (175 mcg total) by mouth every other day. 30 tablet 3   Multiple Vitamin (MULTIVITAMIN WITH MINERALS) TABS tablet Take 1 tablet by mouth daily. 30 tablet 3   temazepam (RESTORIL) 7.5 MG capsule Take 1 capsule (7.5 mg total) by mouth at bedtime as needed for sleep. 30 capsule 0   No current facility-administered medications for this visit.     Allergies:  Allergies  Allergen Reactions   Benadryl [Diphenhydramine] Other (See Comments)    Severe restless legs        Physical Exam:       Blood pressure 137/72, pulse 75, temperature 98.1 F (36.7 C), temperature source Oral, resp. rate 18, height 5\' 8"  (1.727 m), weight 186 lb 12.8 oz (84.7 kg), SpO2 95 %.      ECOG: 1   General appearance: Comfortable appearing without any discomfort Head: Normocephalic without any trauma Oropharynx: Mucous membranes are moist and pink without any thrush or ulcers. Eyes: Pupils are equal and round reactive to light. Lymph nodes: No cervical, supraclavicular, inguinal or axillary lymphadenopathy.   Heart:regular rate and rhythm.  S1 and S2 without leg  edema. Lung: Clear without any rhonchi or wheezes.  No dullness to percussion. Abdomin: Soft, nontender, nondistended with good bowel sounds.  No hepatosplenomegaly. Musculoskeletal: No joint deformity or effusion.  Full range of motion noted. Neurological: No deficits noted on motor, sensory and deep tendon reflex exam. Skin: No petechial rash or dryness.  Appeared moist.              Lab Results: Lab Results  Component Value Date   WBC 5.7 10/16/2020   HGB 12.9 (L) 10/16/2020   HCT 37.6 (L) 10/16/2020   MCV 87.6 10/16/2020    PLT 202 10/16/2020     Chemistry      Component Value Date/Time   NA 142 10/16/2020 0928   NA 134 03/11/2018 0913   K 4.3 10/16/2020 0928   CL 104 10/16/2020 0928   CO2 28 10/16/2020 0928   BUN 20 10/16/2020 0928   BUN 17 03/11/2018 0913   CREATININE 1.55 (H) 10/16/2020 0928      Component Value Date/Time   CALCIUM 9.7 10/16/2020 0928   ALKPHOS 49 10/16/2020 0928   AST 14 (L) 10/16/2020 0928   ALT 14 10/16/2020 0928   BILITOT 0.4 10/16/2020 0928     IMPRESSION: Chest Impression:   1. No evidence of thoracic metastasis. 2. Postsurgical change the RIGHT lung.   Abdomen / Pelvis Impression:   1. Stable postsurgical change in the RIGHT nephrectomy bed. 2. Mild increase in size of large retroperitoneal lymph node adjacent to the pancreatic head. 3. Stable soft tissue lobular mass along the anterior margin of the liver. 4. Small omental nodules less conspicuous.     Impression and Plan:  75 year old man with:   1.  Stage IV clear-cell renal cell carcinoma with hepatic and peritoneal metastasis in 2019.   CT scan obtained on October 16, 2020 was personally reviewed and discussed with the patient today.  Scan is overall stable without any evidence of disease progression.  Risks and benefits of continuing current approach versus starting salvage therapy options were reviewed.  I recommended holding off on oral targeted therapy agent such as cabozantinib or axitinib unless clear progression of disease is indicated.  The plan is to continue with active surveillance and repeat imaging studies in 6 months.    2.  Immune mediated complications: He developed a severe autoimmune complications previously and manageable at this time.  3.  Weight loss: currently resolved with weight is stable.  4.  Prognosis and goals of care: His disease is incurable but aggressive measures are warranted given his excellent response to therapy.  5.  Follow-up: He will return in 6 months for a  follow-up visit.  30  minutes were spent on this encounter.  The time was dedicated to reviewing laboratory data, disease status update, treatment choices and future plan of care discussion.   Alex Button, MD 9/6/202210:04 AM

## 2020-10-23 ENCOUNTER — Telehealth: Payer: Self-pay | Admitting: Oncology

## 2020-10-23 NOTE — Telephone Encounter (Signed)
Scheduled follow-up appointments per 9/6 los. Patient's daughter is aware. Mailed calendar.

## 2020-12-05 ENCOUNTER — Ambulatory Visit: Payer: Medicare HMO | Admitting: Internal Medicine

## 2021-01-20 ENCOUNTER — Other Ambulatory Visit: Payer: Self-pay

## 2021-01-20 ENCOUNTER — Ambulatory Visit: Payer: Medicare HMO | Admitting: Internal Medicine

## 2021-01-20 ENCOUNTER — Encounter: Payer: Self-pay | Admitting: Internal Medicine

## 2021-01-20 VITALS — BP 100/60 | HR 70 | Ht 68.0 in | Wt 182.6 lb

## 2021-01-20 DIAGNOSIS — E039 Hypothyroidism, unspecified: Secondary | ICD-10-CM

## 2021-01-20 DIAGNOSIS — E2749 Other adrenocortical insufficiency: Secondary | ICD-10-CM | POA: Diagnosis not present

## 2021-01-20 DIAGNOSIS — Z23 Encounter for immunization: Secondary | ICD-10-CM | POA: Diagnosis not present

## 2021-01-20 LAB — TSH: TSH: 3.06 u[IU]/mL (ref 0.35–5.50)

## 2021-01-20 LAB — T4, FREE: Free T4: 1.33 ng/dL (ref 0.60–1.60)

## 2021-01-20 NOTE — Patient Instructions (Signed)
Please continueHydrocortisone to 10 mg in a and 5 mg in the pm.  - You absolutely need to take this medication every day and not skip doses. - Please double the dose if you have a fever, for the duration of the fever. - If you cannot take anything by mouth (vomiting) or you have severe diarrhea so that you eliminate the hydrocortisone pills in your stool, please make sure that you get the hydrocortisone in the vein instead - go to the nearest emergency department/urgent care or you may go to your PCPs office  - Please try to get a MedAlert bracelet or pendant indicating: "Adrenal insufficiency".  Please continue: - Levothyroxine 175 mcg alternating with 150 mcg every other day.  Take the thyroid hormone every day, with water, at least 30 minutes before breakfast, separated by at least 4 hours from: - acid reflux medications - calcium - iron - multivitamins  Please stop at the lab.  Please come back for a follow-up appointment in 6 months.

## 2021-01-20 NOTE — Progress Notes (Signed)
Patient ID: [name], male   DOB: May 16, 1945, 75 y.o.   MRN: [id number]   This visit occurred during the SARS-CoV-2 public health emergency.  Safety protocols were in place, including screening questions prior to the visit, additional usage of staff PPE, and extensive cleaning of exam room while observing appropriate contact time as indicated for disinfecting solutions.   HPI  [name] is a 75 y.o.-year-old male, returning for follow-up for hypothyroidism and adrenal insufficiency.  Last OV 7 months ago.  Interim history: He feels well, w/o complaints today. He had flulike symptoms 10 days ago >> lost a little weight.  He had mild fever, 100.4 Fahrenheit but did not increase the dose of steroids at that time.  Reviewed history: Patient was admitted for weakness on 03/13/2018 with mild hyponatremia of 129.  However, he was recently admitted on 03/24/2018 with severe hyponatremia, at 115.  At that time, he was found to be profoundly hypothyroid and also adrenal insufficiency.  He was started on hormonal replacement for both conditions and his sodium was slowly repeated.  His sodium improved to normal before discharge.  He was sent home with levothyroxine and hydrocortisone.  He was advised to see endocrinology in 1 month.  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 He had recurrence in 09/2017 >> metastases in Lung, Liver, and L kidney. He is on ipilimumab Curt Bears) and Nivolumab (Opdivo) - started 09/2017, continued q3 weeks x 4 >> stopped after developing weakness in ~12/2017.  Reviewing his oncologist note, he will continue with p.o. Biologics if needed after he has a new CT scan.  He finished RxTx in 10/2019.  Primary Hypothyroidism: He was diagnosed with hypothyroidism in 03/2018 and started on levothyroxine. We subsequently increased the dose up to 175 mcg daily, last dose change 01/2020.  In 05/2020, his TSH was suppressed  so we decreased the dose of levothyroxine.  He takes levothyroxine 175 alternating with 150 mcg every other day: - misses it maybe 1x a month - in am - fasting - at least 30 min from b'fast - no Ca, Fe, + MVI at night - stopped PPI - + Pepcid later in the day - not on Biotin  Reviewed his TFTs: Lab Results  Component Value Date   TSH 0.27 (L) 06/05/2020   TSH 2.48 03/06/2020   TSH 6.91 (H) 01/24/2020   TSH 7.45 (H) 12/04/2019   TSH 0.98 06/01/2019   TSH 2.15 04/20/2019   TSH 4.24 12/06/2018   TSH 5.47 (H) 10/31/2018   TSH 12.01 (H) 09/14/2018   TSH 34.68 (H) 08/03/2018   FREET4 1.24 06/05/2020   FREET4 1.07 03/06/2020   FREET4 1.08 01/24/2020   FREET4 0.89 12/04/2019   FREET4 1.18 06/01/2019   FREET4 1.04 04/20/2019   FREET4 1.01 12/06/2018   FREET4 1.00 10/31/2018   FREET4 0.92 09/14/2018   FREET4 0.73 08/03/2018   T3FREE 0.5 (L) 03/24/2018   Received labs from PCP, on 10/19/2018: CBC with low hemoglobin, at 12.5 CMP with high glucose at 178, BUN/creatinine 18/1.78, GFR 37, otherwise normal TSH 2.64, free T4 1.35  Antithyroid antibodies: No results found for: THGAB No components found for: TPOAB  Pt denies: - feeling nodules in neck - hoarseness - dysphagia - choking - SOB with lying down  No FH of thyroid disease or cancer. No h/o radiation tx to head or neck.  No herbal supplements. No Biotin use. No recent steroids  use.   Adrenal insufficiency:  He was found to have a low cortisol, of 2.4 on 03/24/2018.    A cosyntropin stimulation test was abnormal the next day. Component     Latest Ref Rng & Units 03/24/2018 03/25/2018  Cortisol, Base     ug/dL  2.2  Cortisol, 30 Min     ug/dL  6.9  Cortisol, 60 Min     ug/dL  8.6  Cortisol, Plasma     ug/dL 2.4    He had significant fatigue, weakness, before starting hydrocortisone.  He was started on a higher dose, 20 mg a.m. and 10 mg in a.m. and he felt much better immediately afterwards.  We were able to  subsequently decrease the dose to 10 mg in a.m. and 5 mg in p.m. however, he did misunderstood instructions and was taking only 5 mg twice a day.  He did develop fatigue on this dose so we increased the dose back to 10 mg in a.m. and 5 mg in p.m..  He continues on this dose now.  No unintentional weight loss (mild weight loss in the setting of flulike illness recently), nausea, weakness, dizziness, headaches. He has fatigue and decreased appetite, improved after finishing RxTx.  He did not have to double up on the dose of hydrocortisone or take this IV/IM since last visit.  Hyponatremia: -Developed in 02/2018 -Most likely secondary to uncontrolled central adrenal insufficiency and hypothyroidism  Reviewed his sodium levels: Lab Results  Component Value Date   NA 142 10/16/2020   NA 141 04/09/2020   NA 139 12/27/2019   NA 143 08/03/2019   NA 145 04/05/2019   NA 144 12/30/2018   NA 140 08/30/2018   NA 142 05/17/2018   NA 137 04/08/2018   NA 135 03/28/2018   NA 135 03/27/2018   NA 129 (L) 03/26/2018   NA 124 (L) 03/25/2018   NA 123 (L) 03/25/2018   NA 120 (L) 03/25/2018   NA 117 (LL) 03/24/2018   NA 116 (LL) 03/24/2018   NA 115 (LL) 03/24/2018   NA 116 (L) 03/24/2018   NA 120 (L) 03/22/2018   NA 136 03/16/2018   NA 134 (L) 03/15/2018   NA 131 (L) 03/14/2018   NA 129 (L) 03/13/2018   NA 134 03/11/2018   NA 136 03/04/2018   NA 135 02/10/2018   NA 141 01/26/2018   NA 139 01/12/2018   NA 137 01/06/2018   He had lung CA in 1993-1994.  He quit smoking in 1990  ROS: + SEE hpi  I reviewed pt's medications, allergies, PMH, social hx, family hx, and changes were documented in the history of present illness. Otherwise, unchanged from my initial visit note.  Past Medical History:  Diagnosis Date   Arthritis    Chronic kidney disease    only has one kidney    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 02/01/2015   Coronary artery disease    followed by  Dr.Tilley   GERD (gastroesophageal reflux disease)    Heart murmur    High cholesterol    Hx of cancer of lung 1980's   Hypertension    Incisional hernia 08/01/2015   Lung cancer ([place]) 1993   Lung metastases ([place]) 2019   Recurrent umbilical hernia 02/24/3233   Right renal mass    Past Surgical History:  Procedure Laterality Date   APPENDECTOMY  age 25   CHOLECYSTECTOMY  57's   LAPAROSCOPIC LYSIS OF ADHESIONS N/A  08/01/2015   Procedure: LAPAROSCOPIC LYSIS OF ADHESIONS;  Surgeon: Michael Boston, MD;  Location: [facility];  Service: General;  Laterality: N/A;   LUNG LOBECTOMY  1992   lung cancer- patient has staples in lung not to have MRI per patient    PILONIDAL CYST EXCISION  2011   Dr Harlow Asa   ROBOTIC ASSITED PARTIAL NEPHRECTOMY Right 02/01/2015   Procedure: RIGHT ROBOTIC ASSISTED [place];  Surgeon: Ardis Hughs, MD;  Location: [facility];  Service: Urology;  Laterality: Right;   VENTRAL HERNIA REPAIR N/A 08/01/2015   Procedure: LAPAROSCOPIC VENTRAL WALL HERNIA WITH MESH;  Surgeon: Michael Boston, MD;  Location: [facility];  Service: General;  Laterality: N/A;   Social History   Socioeconomic History   Marital status: Married    Spouse name: Not on file   Number of children: Not on file   Years of education: Not on file   Highest education level: Not on file  Occupational History   Occupation: Retired  Tobacco Use   Smoking status: Former    Packs/day: 1.00    Years: 20.00    Pack years: 20.00    Types: Cigarettes    Quit date: 01/29/1988    Years since quitting: 33.0   Smokeless tobacco: Never  Substance and Sexual Activity   Alcohol use: Yes    Comment: rare   Drug use: No   Sexual activity: Not on file  Other Topics Concern   Not on file  Social History Narrative   Not on file   Social Determinants of Health   Financial Resource Strain: Not on file  Food Insecurity: Not on file  Transportation Needs: Not on file  Physical Activity: Not on file   Stress: Not on file  Social Connections: Not on file  Intimate Partner Violence: Not on file   Current Outpatient Medications on File Prior to Visit  Medication Sig Dispense Refill   acetaminophen (TYLENOL) 325 MG tablet Take 2 tablets (650 mg total) by mouth every 6 (six) hours as needed for mild pain or headache (over the counter). 30 tablet 3   aspirin 81 MG chewable tablet Chew 81 mg by mouth daily.     diltiazem (CARDIZEM CD) 120 MG 24 hr capsule      famotidine (PEPCID) 20 MG tablet Take 20 mg by mouth 2 (two) times daily.     hydrocortisone (CORTEF) 5 MG tablet Take 2 tablets in am and 1 tablet in pm, ~ 2pm. 300 tablet 3   levothyroxine (SYNTHROID) 150 MCG tablet Take 1 tablet (150 mcg total) by mouth every other day. 30 tablet 3   levothyroxine (SYNTHROID) 175 MCG tablet Take 1 tablet (175 mcg total) by mouth every other day. 30 tablet 3   Multiple Vitamin (MULTIVITAMIN WITH MINERALS) TABS tablet Take 1 tablet by mouth daily. 30 tablet 3   temazepam (RESTORIL) 7.5 MG capsule Take 1 capsule (7.5 mg total) by mouth at bedtime as needed for sleep. 30 capsule 0   No current facility-administered medications on file prior to visit.   Allergies  Allergen Reactions   Benadryl [Diphenhydramine] Other (See Comments)    Severe restless legs   Family History  Problem Relation Age of Onset   Heart attack Father    Cardiologist: Dr. Sallyanne Kuster.  PE: BP 100/60 (BP Location: Right Arm, Patient Position: Sitting, Cuff Size: Normal)   Pulse 70   Ht 5\' 8"  (1.727 m)   Wt 182 lb 9.6 oz (82.8 kg)  SpO2 94%   BMI 27.76 kg/m  Wt Readings from Last 3 Encounters:  01/20/21 182 lb 9.6 oz (82.8 kg)  10/22/20 186 lb 12.8 oz (84.7 kg)  06/05/20 186 lb 3.2 oz (84.5 kg)   Constitutional: overweight, in NAD Eyes: PERRLA, EOMI, no exophthalmos ENT: moist mucous membranes, no thyromegaly, no cervical lymphadenopathy Cardiovascular: RRR, No MRG Respiratory: CTA B Musculoskeletal: no deformities,  strength intact in all 4 Skin: moist, warm, no rashes Neurological: no tremor with outstretched hands, DTR normal in all 4  ASSESSMENT: 1. Primary Hypothyroidism  2. Central Adrenal Insufficiency  3. Hyponatremia -Low sodium: 115 during his admission from 03/2018, likely due to uncontrolled hypothyroidism and central adrenal insufficiency, which are not treated -Sodium normalized afterwards.  PLAN:  1. Patient with primary hypothyroidism, on levothyroxine -Likely secondary to ipilimumab -He was admitted in 03/2018 and the TSH was found to be 86.  He was initially given levothyroxine IV, then 50 mcg p.o. we subsequently increase the dose to 175 mcg daily in 01/2020, but at last visit we decreased the dose slightly.  He did not return for labs in 1.5 months after this.  At this visit we discussed that if we end up changing the dose of levothyroxine, he has to come back for labs in approximately 6 weeks. - latest thyroid labs reviewed with pt. >> suppressed: Lab Results  Component Value Date   TSH 0.27 (L) 06/05/2020  - he continues on LT4 150 alternating with 175 mcg every other day - pt feels good on this dose. - we discussed about taking the thyroid hormone every day, with water, >30 minutes before breakfast, separated by >4 hours from acid reflux medications, calcium, iron, multivitamins. Pt. is taking it correctly. - will check thyroid tests today: TSH and fT4 - If labs are abnormal, he will need to return for repeat TFTs in 1.5 months - OTW, I will see him back in 6 months  2. Central Adrenal Insufficiency -Also likely secondary to ipilimumab -he did not have to double the dose of cortisone or take it parenterally since last visit.  He did have a fever at 100.4 F during his flulike illness recently and I advised him to go ahead and double the dose of prednisone for the duration of the fever if in the future has a temperature of >100F -he was previously on hydrocortisone 20 mg in  a.m. and 10 mg in p.m. and I advised him to decrease the dose to a more physiologic 10 mg in a.m. and 5 mg in p.m.  At that time, he misunderstood instructions and was only taking 5 mg twice a day but afterwards especially during his radiation therapy, he started to feel more fatigued so increase the dose to 10 mg in a.m. and 5 mg in p.m.  At last visit, he was on this dose, feeling well.  He continues to feel well on this dose, without complaints.  He does mention that he occasionally misses to 5 mg of hydrocortisone in the afternoon.   -He has a medical alert bracelet mentioning "adrenal insufficiency" -We again discussed about sick day rules: If you cannot keep anything down, including your hydrocortisone medication, please go to the emergency room or your primary care physician office to get steroids injected in the muscle or vein.  If you have a fever (more than 100 Fahrenheit) or gastroenteritis with nausea/vomiting and diarrhea, please double the dose of your hydrocortisone for the duration of the fever or the gastroenteritis.  Do not run out of your hydrocortisone medication.  + flu shot today  Component     Latest Ref Rng & Units 01/20/2021          TSH     0.35 - 5.50 uIU/mL 3.06  T4,Free(Direct)     0.60 - 1.60 ng/dL 1.33  Normal TFTs >> continue the same dose of levothyroxine.  Philemon Kingdom, MD PhD [facility] Endocrinology

## 2021-03-17 ENCOUNTER — Other Ambulatory Visit: Payer: Self-pay | Admitting: Internal Medicine

## 2021-03-25 ENCOUNTER — Encounter (HOSPITAL_COMMUNITY): Payer: Self-pay

## 2021-03-25 ENCOUNTER — Inpatient Hospital Stay (HOSPITAL_COMMUNITY)
Admission: EM | Admit: 2021-03-25 | Discharge: 2021-03-29 | DRG: 683 | Disposition: A | Discharge status: Home / Self Care | Payer: Medicare HMO | Attending: Internal Medicine | Admitting: Internal Medicine

## 2021-03-25 ENCOUNTER — Emergency Department (HOSPITAL_COMMUNITY): Payer: Medicare HMO

## 2021-03-25 ENCOUNTER — Other Ambulatory Visit: Payer: Self-pay

## 2021-03-25 DIAGNOSIS — I959 Hypotension, unspecified: Secondary | ICD-10-CM | POA: Diagnosis not present

## 2021-03-25 DIAGNOSIS — N1831 Chronic kidney disease, stage 3a: Secondary | ICD-10-CM | POA: Diagnosis present

## 2021-03-25 DIAGNOSIS — Z79890 Hormone replacement therapy (postmenopausal): Secondary | ICD-10-CM

## 2021-03-25 DIAGNOSIS — Z7982 Long term (current) use of aspirin: Secondary | ICD-10-CM

## 2021-03-25 DIAGNOSIS — R0902 Hypoxemia: Secondary | ICD-10-CM | POA: Diagnosis present

## 2021-03-25 DIAGNOSIS — E7800 Pure hypercholesterolemia, unspecified: Secondary | ICD-10-CM | POA: Diagnosis present

## 2021-03-25 DIAGNOSIS — A419 Sepsis, unspecified organism: Secondary | ICD-10-CM | POA: Diagnosis present

## 2021-03-25 DIAGNOSIS — I700 Atherosclerosis of aorta: Secondary | ICD-10-CM | POA: Diagnosis present

## 2021-03-25 DIAGNOSIS — R000 Tachycardia, unspecified: Secondary | ICD-10-CM | POA: Diagnosis not present

## 2021-03-25 DIAGNOSIS — C649 Malignant neoplasm of unspecified kidney, except renal pelvis: Secondary | ICD-10-CM | POA: Diagnosis present

## 2021-03-25 DIAGNOSIS — Z888 Allergy status to other drugs, medicaments and biological substances status: Secondary | ICD-10-CM

## 2021-03-25 DIAGNOSIS — N183 Acute kidney failure, unspecified: Secondary | ICD-10-CM | POA: Diagnosis present

## 2021-03-25 DIAGNOSIS — R4182 Altered mental status, unspecified: Secondary | ICD-10-CM | POA: Diagnosis not present

## 2021-03-25 DIAGNOSIS — Z9889 Other specified postprocedural states: Secondary | ICD-10-CM | POA: Diagnosis not present

## 2021-03-25 DIAGNOSIS — Z85118 Personal history of other malignant neoplasm of bronchus and lung: Secondary | ICD-10-CM

## 2021-03-25 DIAGNOSIS — Z905 Acquired absence of kidney: Secondary | ICD-10-CM

## 2021-03-25 DIAGNOSIS — I10 Essential (primary) hypertension: Secondary | ICD-10-CM | POA: Diagnosis present

## 2021-03-25 DIAGNOSIS — R6510 Systemic inflammatory response syndrome (SIRS) of non-infectious origin without acute organ dysfunction: Secondary | ICD-10-CM | POA: Diagnosis present

## 2021-03-25 DIAGNOSIS — I2510 Atherosclerotic heart disease of native coronary artery without angina pectoris: Secondary | ICD-10-CM | POA: Diagnosis present

## 2021-03-25 DIAGNOSIS — E039 Hypothyroidism, unspecified: Secondary | ICD-10-CM | POA: Diagnosis present

## 2021-03-25 DIAGNOSIS — Z85528 Personal history of other malignant neoplasm of kidney: Secondary | ICD-10-CM

## 2021-03-25 DIAGNOSIS — R509 Fever, unspecified: Secondary | ICD-10-CM

## 2021-03-25 DIAGNOSIS — S2249XA Multiple fractures of ribs, unspecified side, initial encounter for closed fracture: Secondary | ICD-10-CM | POA: Diagnosis not present

## 2021-03-25 DIAGNOSIS — Z743 Need for continuous supervision: Secondary | ICD-10-CM | POA: Diagnosis not present

## 2021-03-25 DIAGNOSIS — R918 Other nonspecific abnormal finding of lung field: Secondary | ICD-10-CM | POA: Diagnosis not present

## 2021-03-25 DIAGNOSIS — E2740 Unspecified adrenocortical insufficiency: Secondary | ICD-10-CM | POA: Diagnosis present

## 2021-03-25 DIAGNOSIS — N179 Acute kidney failure, unspecified: Secondary | ICD-10-CM | POA: Diagnosis not present

## 2021-03-25 DIAGNOSIS — E860 Dehydration: Secondary | ICD-10-CM | POA: Diagnosis present

## 2021-03-25 DIAGNOSIS — K219 Gastro-esophageal reflux disease without esophagitis: Secondary | ICD-10-CM | POA: Diagnosis present

## 2021-03-25 DIAGNOSIS — I129 Hypertensive chronic kidney disease with stage 1 through stage 4 chronic kidney disease, or unspecified chronic kidney disease: Secondary | ICD-10-CM | POA: Diagnosis present

## 2021-03-25 DIAGNOSIS — Z20822 Contact with and (suspected) exposure to covid-19: Secondary | ICD-10-CM | POA: Diagnosis present

## 2021-03-25 DIAGNOSIS — Z79899 Other long term (current) drug therapy: Secondary | ICD-10-CM

## 2021-03-25 DIAGNOSIS — E785 Hyperlipidemia, unspecified: Secondary | ICD-10-CM | POA: Diagnosis present

## 2021-03-25 DIAGNOSIS — R531 Weakness: Secondary | ICD-10-CM

## 2021-03-25 LAB — CBC WITH DIFFERENTIAL/PLATELET
Abs Immature Granulocytes: 0.04 10*3/uL (ref 0.00–0.07)
Basophils Absolute: 0 10*3/uL (ref 0.0–0.1)
Basophils Relative: 0 %
Eosinophils Absolute: 0 10*3/uL (ref 0.0–0.5)
Eosinophils Relative: 0 %
HCT: 44.1 % (ref 39.0–52.0)
Hemoglobin: 13.9 g/dL (ref 13.0–17.0)
Immature Granulocytes: 1 %
Lymphocytes Relative: 23 %
Lymphs Abs: 1.8 10*3/uL (ref 0.7–4.0)
MCH: 29.3 pg (ref 26.0–34.0)
MCHC: 31.5 g/dL (ref 30.0–36.0)
MCV: 93 fL (ref 80.0–100.0)
Monocytes Absolute: 0.8 10*3/uL (ref 0.1–1.0)
Monocytes Relative: 10 %
Neutro Abs: 5 10*3/uL (ref 1.7–7.7)
Neutrophils Relative %: 66 %
Platelets: 231 10*3/uL (ref 150–400)
RBC: 4.74 MIL/uL (ref 4.22–5.81)
RDW: 12.5 % (ref 11.5–15.5)
WBC: 7.6 10*3/uL (ref 4.0–10.5)
nRBC: 0 % (ref 0.0–0.2)

## 2021-03-25 LAB — COMPREHENSIVE METABOLIC PANEL
ALT: 15 U/L (ref 0–44)
AST: 20 U/L (ref 15–41)
Albumin: 4.6 g/dL (ref 3.5–5.0)
Alkaline Phosphatase: 43 U/L (ref 38–126)
Anion gap: 11 (ref 5–15)
BUN: 40 mg/dL — ABNORMAL HIGH (ref 8–23)
CO2: 22 mmol/L (ref 22–32)
Calcium: 8.7 mg/dL — ABNORMAL LOW (ref 8.9–10.3)
Chloride: 105 mmol/L (ref 98–111)
Creatinine, Ser: 2.78 mg/dL — ABNORMAL HIGH (ref 0.61–1.24)
GFR, Estimated: 23 mL/min — ABNORMAL LOW (ref >60)
Glucose, Bld: 100 mg/dL — ABNORMAL HIGH (ref 70–99)
Potassium: 4.9 mmol/L (ref 3.5–5.1)
Sodium: 138 mmol/L (ref 135–145)
Total Bilirubin: 0.7 mg/dL (ref 0.3–1.2)
Total Protein: 8.3 g/dL — ABNORMAL HIGH (ref 6.5–8.1)

## 2021-03-25 LAB — RESP PANEL BY RT-PCR (FLU A&B, COVID) ARPGX2
Influenza A by PCR: NEGATIVE
Influenza B by PCR: NEGATIVE
SARS Coronavirus 2 by RT PCR: NEGATIVE

## 2021-03-25 LAB — APTT: aPTT: 41 seconds — ABNORMAL HIGH (ref 24–36)

## 2021-03-25 LAB — LACTIC ACID, PLASMA: Lactic Acid, Venous: 2 mmol/L (ref 0.5–1.9)

## 2021-03-25 LAB — PROTIME-INR
INR: 1.2 (ref 0.8–1.2)
Prothrombin Time: 15.2 seconds (ref 11.4–15.2)

## 2021-03-25 MED ORDER — LACTATED RINGERS IV BOLUS (SEPSIS)
1000.0000 mL | Freq: Once | INTRAVENOUS | Status: AC
  Administered 2021-03-25: 1000 mL via INTRAVENOUS

## 2021-03-25 MED ORDER — LACTATED RINGERS IV BOLUS (SEPSIS)
1000.0000 mL | Freq: Once | INTRAVENOUS | Status: AC
  Administered 2021-03-26: 1000 mL via INTRAVENOUS

## 2021-03-25 MED ORDER — ACETAMINOPHEN 325 MG PO TABS
650.0000 mg | ORAL_TABLET | Freq: Once | ORAL | Status: AC
  Administered 2021-03-26: 650 mg via ORAL
  Filled 2021-03-25: qty 2

## 2021-03-25 MED ORDER — VANCOMYCIN HCL IN DEXTROSE 1-5 GM/200ML-% IV SOLN
1000.0000 mg | Freq: Once | INTRAVENOUS | Status: AC
  Administered 2021-03-25: 1000 mg via INTRAVENOUS
  Filled 2021-03-25: qty 200

## 2021-03-25 MED ORDER — LACTATED RINGERS IV SOLN: INTRAVENOUS | Status: AC

## 2021-03-25 MED ORDER — HYDROCORTISONE SOD SUC (PF) 100 MG IJ SOLR
100.0000 mg | Freq: Once | INTRAMUSCULAR | Status: AC
  Administered 2021-03-26: 100 mg via INTRAVENOUS
  Filled 2021-03-25: qty 2

## 2021-03-25 MED ORDER — METRONIDAZOLE 500 MG/100ML IV SOLN
500.0000 mg | Freq: Once | INTRAVENOUS | Status: AC
  Administered 2021-03-25: 500 mg via INTRAVENOUS
  Filled 2021-03-25: qty 100

## 2021-03-25 MED ORDER — SODIUM CHLORIDE 0.9 % IV SOLN
2.0000 g | Freq: Once | INTRAVENOUS | Status: AC
  Administered 2021-03-25: 2 g via INTRAVENOUS
  Filled 2021-03-25: qty 2

## 2021-03-25 NOTE — ED Notes (Signed)
Pt BIBA for lethargy, not eating drinking or acting normally per family. Pt a/ox3, slow to respond. Pt denies dizziness, headache, visual disturbance, CP, SOB, cough, ABD pain, n/v/d, GU symptoms. Per family, did vomit once yesterday.

## 2021-03-25 NOTE — ED Triage Notes (Signed)
Pt BIBA for decrease in daily activity, eating and drinking. A/ox3-4, very slow to respond. +Febrile, hypoxic, tachycardic.

## 2021-03-25 NOTE — Progress Notes (Signed)
A consult was received from an ED physician for vancomycin and cefepime per pharmacy dosing.  The patient's profile has been reviewed for ht/wt/allergies/indication/available labs.    A one time order has been placed for vancomycin 1gm and cefepime 2gm x1.  Further antibiotics/pharmacy consults should be ordered by admitting physician if indicated.                       Thank you, Lynelle Doctor 03/25/2021  8:34 PM

## 2021-03-25 NOTE — ED Provider Notes (Signed)
[facility] DEPT Provider Note   CSN: [id number] Arrival date & time: 03/25/21  1925     History  Chief Complaint  Patient presents with   Code Sepsis    [name] is a 76 y.o. male.  HPI  Patient presented to the ED for evaluation of decreased activity, fever, change in mental status.  Patient lives by himself and normally is able to care for himself without difficulty.  Family checked on him today and noted patient was more listless.  He was not eating and drinking like he normally does.  He was also slower to respond.  EMS was called and noticed to be febrile hypoxic and tachycardic.  He was sent to the ED for further evaluation.  Patient himself states he feels that he might have a flu bug.  He has been coughing somewhat.  He is slow to answer questions however does not provide a lot of details.  He denies any specific pain right now.  Home Medications Prior to Admission medications   Medication Sig Start Date End Date Taking? Authorizing Provider  acetaminophen (TYLENOL) 325 MG tablet Take 2 tablets (650 mg total) by mouth every 6 (six) hours as needed for mild pain or headache (over the counter). Patient taking differently: Take 650 mg by mouth every 6 (six) hours as needed for mild pain or headache. 03/16/18  Yes Rai, Ripudeep K, MD  aspirin 81 MG chewable tablet Chew 81 mg by mouth daily.   Yes [provider]  Cholecalciferol (VITAMIN D3) 50 MCG (2000 UT) TABS Take 2,000 Units by mouth daily.   Yes [provider]  diltiazem (CARDIZEM CD) 120 MG 24 hr capsule Take 120 mg by mouth in the morning. 03/31/18  Yes [provider]  famotidine (PEPCID) 20 MG tablet Take 20 mg by mouth See admin instructions. Take 20 mg by mouth at 3 PM daily and an additional 20 mg once a day as needed for heartburn or indigestion 05/05/19  Yes [provider]  hydrocortisone (CORTEF) 10 MG tablet Take 5-10 mg by mouth See admin  instructions. Take 10 mg by mouth in the morning and 5 mg in the afternoon   Yes [provider]  levothyroxine (SYNTHROID) 150 MCG tablet TAKE 1 TABLET BY MOUTH EVERY OTHER DAY Patient taking differently: Take 150 mcg by mouth See admin instructions. Take 150 mcg by mouth every other morning before breakfast 03/17/21  Yes Philemon Kingdom, MD  levothyroxine (SYNTHROID) 175 MCG tablet TAKE 1 TABLET BY MOUTH EVERY OTHER DAY Patient taking differently: Take 175 mcg by mouth See admin instructions. Take 175 mcg by mouth every other morning before breakfast 03/17/21  Yes Philemon Kingdom, MD  Multiple Vitamins-Minerals (CENTRUM SILVER 50+MEN) TABS Take 1 tablet by mouth daily with breakfast.   Yes [provider]  traZODone (DESYREL) 150 MG tablet Take 150 mg by mouth at bedtime as needed for sleep.   Yes [provider]  hydrocortisone (CORTEF) 5 MG tablet Take 2 tablets in am and 1 tablet in pm, ~ 2pm. Patient not taking: Reported on 03/25/2021 06/05/20   Philemon Kingdom, MD  Multiple Vitamin (MULTIVITAMIN WITH MINERALS) TABS tablet Take 1 tablet by mouth daily. Patient not taking: Reported on 03/25/2021 03/16/18   Rai, Ripudeep K, MD  temazepam (RESTORIL) 7.5 MG capsule Take 1 capsule (7.5 mg total) by mouth at bedtime as needed for sleep. Patient not taking: Reported on 03/25/2021 03/22/18   Harle Stanford., PA-C  Allergies    Benadryl [diphenhydramine]    Review of Systems   Review of Systems  Constitutional:  Positive for fever.   Physical Exam Updated Vital Signs BP 131/63    Pulse 92    Temp 100.3 F (37.9 C)    Resp 19    Ht 1.753 m (5\' 9" )    Wt 90.7 kg    SpO2 100%    BMI 29.53 kg/m  Physical Exam Vitals and nursing note reviewed.  Constitutional:      Appearance: He is well-developed. He is ill-appearing.  HENT:     Head: Normocephalic and atraumatic.     Right Ear: External ear normal.     Left Ear: External ear normal.  Eyes:     General: No scleral  icterus.       Right eye: No discharge.        Left eye: No discharge.     Conjunctiva/sclera: Conjunctivae normal.  Neck:     Trachea: No tracheal deviation.  Cardiovascular:     Rate and Rhythm: Normal rate and regular rhythm.  Pulmonary:     Effort: Pulmonary effort is normal. No respiratory distress.     Breath sounds: Normal breath sounds. No stridor. No wheezing or rales.  Abdominal:     General: Bowel sounds are normal. There is no distension.     Palpations: Abdomen is soft.     Tenderness: There is no abdominal tenderness. There is no guarding or rebound.  Musculoskeletal:        General: No tenderness or deformity.     Cervical back: Neck supple.  Skin:    General: Skin is warm and dry.     Findings: No rash.  Neurological:     General: No focal deficit present.     Mental Status: He is alert.     Cranial Nerves: No cranial nerve deficit (no facial droop, extraocular movements intact, no slurred speech).     Sensory: No sensory deficit.     Motor: No abnormal muscle tone or seizure activity.     Coordination: Coordination normal.  Psychiatric:        Mood and Affect: Mood normal.    ED Results / Procedures / Treatments   Labs (all labs ordered are listed, but only abnormal results are displayed) Labs Reviewed  LACTIC ACID, PLASMA - Abnormal; Notable for the following components:      Result Value   Lactic Acid, Venous 2.0 (*)    All other components within normal limits  COMPREHENSIVE METABOLIC PANEL - Abnormal; Notable for the following components:   Glucose, Bld 100 (*)    BUN 40 (*)    Creatinine, Ser 2.78 (*)    Calcium 8.7 (*)    Total Protein 8.3 (*)    GFR, Estimated 23 (*)    All other components within normal limits  APTT - Abnormal; Notable for the following components:   aPTT 41 (*)    All other components within normal limits  RESP PANEL BY RT-PCR (FLU A&B, COVID) ARPGX2  CULTURE, BLOOD (ROUTINE X 2)  CULTURE, BLOOD (ROUTINE X 2)  URINE  CULTURE  CBC WITH DIFFERENTIAL/PLATELET  PROTIME-INR  LACTIC ACID, PLASMA  URINALYSIS, ROUTINE W REFLEX MICROSCOPIC  TSH    EKG EKG Interpretation  Date/Time:  Tuesday March 25 2021 20:17:13 EST Ventricular Rate:  100 PR Interval:  164 QRS Duration: 87 QT Interval:  342 QTC Calculation: 442 R Axis:   -18 Text Interpretation:  Sinus tachycardia Ventricular premature complex Borderline left axis deviation Since last tracing rate faster Confirmed by Dorie Rank [phone]) on 03/25/2021 9:33:49 PM  Radiology DG Chest Port 1 View  Result Date: 03/25/2021 CLINICAL DATA:  Question of sepsis. EXAM: PORTABLE CHEST 1 VIEW COMPARISON:  CT 10/16/2020 FINDINGS: Postoperative changes on the right with associated volume loss in the right lung consistent with partial resection. No airspace disease or consolidation in the lungs. No pleural effusions. No pneumothorax. Mediastinal contours appear intact. Normal heart size. Old rib fractures. IMPRESSION: Postoperative partial right lung resection. No evidence of active pulmonary disease. Electronically Signed   By: Lucienne Capers M.D.   On: 03/25/2021 20:44    Procedures .Critical Care Performed by: Dorie Rank, MD Authorized by: Dorie Rank, MD   Critical care provider statement:    Critical care time (minutes):  30   Critical care was time spent personally by me on the following activities:  Development of treatment plan with patient or surrogate, discussions with consultants, evaluation of patient's response to treatment, examination of patient, ordering and review of laboratory studies, ordering and review of radiographic studies, ordering and performing treatments and interventions, pulse oximetry, re-evaluation of patient's condition and review of old charts    Medications Ordered in ED Medications  lactated ringers infusion ( Intravenous New Bag/Given 03/25/21 2052)  lactated ringers bolus 1,000 mL (0 mLs Intravenous Stopped 03/25/21 2327)    And   lactated ringers bolus 1,000 mL (has no administration in time range)    And  lactated ringers bolus 1,000 mL (0 mLs Intravenous Stopped 03/25/21 2327)  vancomycin (VANCOCIN) IVPB 1000 mg/200 mL premix (1,000 mg Intravenous New Bag/Given 03/25/21 2257)  hydrocortisone sodium succinate (SOLU-CORTEF) 100 MG injection 100 mg (has no administration in time range)  acetaminophen (TYLENOL) tablet 650 mg (has no administration in time range)  ceFEPIme (MAXIPIME) 2 g in sodium chloride 0.9 % 100 mL IVPB (0 g Intravenous Stopped 03/25/21 2328)  metroNIDAZOLE (FLAGYL) IVPB 500 mg (0 mg Intravenous Stopped 03/25/21 2328)    ED Course/ Medical Decision Making/ A&P Clinical Course as of 03/25/21 2347  Tue Mar 25, 2021  2132 Lactic acid level elevated at 2 [JK]  2132 COVID and flu are negative [JK]  2133 Comprehensive metabolic panel(!) Metabolic panel shows increased BUN and creatinine [JK]  2133 CBC WITH DIFFERENTIAL CBC normal [JK]  2133 Chest x-ray without acute pneumonia [JK]  2325 Prior records reviewed.  Patient does have history of adrenal insufficiency.  We will give a dose of hydrocortisone [JK]  2344 Blood pressure has improved and remained stable after IV fluid resuscitation [JK]    Clinical Course User Index [JK] Dorie Rank, MD                           Medical Decision Making Amount and/or Complexity of Data Reviewed Labs: ordered. Decision-making details documented in ED Course. Radiology: ordered. ECG/medicine tests: ordered.  Risk OTC drugs. Prescription drug management.   Patient presented with possible sepsis.  Patient noted to be hypotensive on initial EMS arrival.  Patient also had fever.  Patient was started on code sepsis protocol.  He has been given IV fluids and empiric antibiotics.  Lactic acid level was elevated however blood pressure is stable now.  Most recent at 131/63.  Patient does have elements of acute kidney injury and dehydration.  He will require admission to  the hospital.  Denies any abdominal pain.  On  serial exams he still is not having any tenderness.  Concerned about the possibility of urinary tract infection bladder outlet obstruction.  In and out catheter been ordered.  We will also order a bladder scan  With the complaints of confusion at home we will add on head CT but suspect this is related to his AKI and febrile illness.  Denies any headache or neck stiffness.  Doubt meningitis.  Urinalysis pending.  Pt will ultimately require admission, care turned over to Dr Stark Jock        Final Clinical Impression(s) / ED Diagnoses Final diagnoses:  AKI (acute kidney injury) ([place])  Febrile illness     Dorie Rank, MD 03/25/21 2347

## 2021-03-26 ENCOUNTER — Emergency Department (HOSPITAL_COMMUNITY): Payer: Medicare HMO

## 2021-03-26 ENCOUNTER — Encounter (HOSPITAL_COMMUNITY): Payer: Self-pay

## 2021-03-26 DIAGNOSIS — N179 Acute kidney failure, unspecified: Secondary | ICD-10-CM | POA: Diagnosis not present

## 2021-03-26 DIAGNOSIS — R4182 Altered mental status, unspecified: Secondary | ICD-10-CM | POA: Diagnosis not present

## 2021-03-26 DIAGNOSIS — Z79899 Other long term (current) drug therapy: Secondary | ICD-10-CM | POA: Diagnosis not present

## 2021-03-26 DIAGNOSIS — Z85528 Personal history of other malignant neoplasm of kidney: Secondary | ICD-10-CM | POA: Diagnosis not present

## 2021-03-26 DIAGNOSIS — Z888 Allergy status to other drugs, medicaments and biological substances status: Secondary | ICD-10-CM | POA: Diagnosis not present

## 2021-03-26 DIAGNOSIS — C641 Malignant neoplasm of right kidney, except renal pelvis: Secondary | ICD-10-CM | POA: Diagnosis not present

## 2021-03-26 DIAGNOSIS — S2249XA Multiple fractures of ribs, unspecified side, initial encounter for closed fracture: Secondary | ICD-10-CM | POA: Diagnosis not present

## 2021-03-26 DIAGNOSIS — R0902 Hypoxemia: Secondary | ICD-10-CM | POA: Diagnosis present

## 2021-03-26 DIAGNOSIS — E7800 Pure hypercholesterolemia, unspecified: Secondary | ICD-10-CM | POA: Diagnosis present

## 2021-03-26 DIAGNOSIS — R3129 Other microscopic hematuria: Secondary | ICD-10-CM | POA: Diagnosis not present

## 2021-03-26 DIAGNOSIS — Z20822 Contact with and (suspected) exposure to covid-19: Secondary | ICD-10-CM | POA: Diagnosis not present

## 2021-03-26 DIAGNOSIS — R6510 Systemic inflammatory response syndrome (SIRS) of non-infectious origin without acute organ dysfunction: Secondary | ICD-10-CM | POA: Diagnosis not present

## 2021-03-26 DIAGNOSIS — E039 Hypothyroidism, unspecified: Secondary | ICD-10-CM | POA: Diagnosis not present

## 2021-03-26 DIAGNOSIS — E2740 Unspecified adrenocortical insufficiency: Secondary | ICD-10-CM | POA: Diagnosis not present

## 2021-03-26 DIAGNOSIS — A419 Sepsis, unspecified organism: Secondary | ICD-10-CM

## 2021-03-26 DIAGNOSIS — K5730 Diverticulosis of large intestine without perforation or abscess without bleeding: Secondary | ICD-10-CM | POA: Diagnosis not present

## 2021-03-26 DIAGNOSIS — N1831 Chronic kidney disease, stage 3a: Secondary | ICD-10-CM | POA: Diagnosis not present

## 2021-03-26 DIAGNOSIS — Z85118 Personal history of other malignant neoplasm of bronchus and lung: Secondary | ICD-10-CM | POA: Diagnosis not present

## 2021-03-26 DIAGNOSIS — R531 Weakness: Secondary | ICD-10-CM | POA: Diagnosis not present

## 2021-03-26 DIAGNOSIS — Z7982 Long term (current) use of aspirin: Secondary | ICD-10-CM | POA: Diagnosis not present

## 2021-03-26 DIAGNOSIS — Z9889 Other specified postprocedural states: Secondary | ICD-10-CM | POA: Diagnosis not present

## 2021-03-26 DIAGNOSIS — Z79890 Hormone replacement therapy (postmenopausal): Secondary | ICD-10-CM | POA: Diagnosis not present

## 2021-03-26 DIAGNOSIS — I700 Atherosclerosis of aorta: Secondary | ICD-10-CM | POA: Diagnosis not present

## 2021-03-26 DIAGNOSIS — N280 Ischemia and infarction of kidney: Secondary | ICD-10-CM | POA: Diagnosis not present

## 2021-03-26 DIAGNOSIS — I129 Hypertensive chronic kidney disease with stage 1 through stage 4 chronic kidney disease, or unspecified chronic kidney disease: Secondary | ICD-10-CM | POA: Diagnosis not present

## 2021-03-26 DIAGNOSIS — E860 Dehydration: Secondary | ICD-10-CM | POA: Diagnosis present

## 2021-03-26 DIAGNOSIS — C480 Malignant neoplasm of retroperitoneum: Secondary | ICD-10-CM | POA: Diagnosis not present

## 2021-03-26 DIAGNOSIS — E785 Hyperlipidemia, unspecified: Secondary | ICD-10-CM | POA: Diagnosis not present

## 2021-03-26 DIAGNOSIS — K219 Gastro-esophageal reflux disease without esophagitis: Secondary | ICD-10-CM | POA: Diagnosis not present

## 2021-03-26 DIAGNOSIS — I2510 Atherosclerotic heart disease of native coronary artery without angina pectoris: Secondary | ICD-10-CM | POA: Diagnosis not present

## 2021-03-26 DIAGNOSIS — R918 Other nonspecific abnormal finding of lung field: Secondary | ICD-10-CM | POA: Diagnosis not present

## 2021-03-26 DIAGNOSIS — I10 Essential (primary) hypertension: Secondary | ICD-10-CM | POA: Diagnosis not present

## 2021-03-26 DIAGNOSIS — Z905 Acquired absence of kidney: Secondary | ICD-10-CM | POA: Diagnosis not present

## 2021-03-26 LAB — URINALYSIS, ROUTINE W REFLEX MICROSCOPIC
Bacteria, UA: NONE SEEN
Bilirubin Urine: NEGATIVE
Glucose, UA: NEGATIVE mg/dL
Ketones, ur: NEGATIVE mg/dL
Leukocytes,Ua: NEGATIVE
Nitrite: NEGATIVE
Protein, ur: NEGATIVE mg/dL
Specific Gravity, Urine: 1.017 (ref 1.005–1.030)
pH: 5 (ref 5.0–8.0)

## 2021-03-26 LAB — BASIC METABOLIC PANEL
Anion gap: 8 (ref 5–15)
BUN: 36 mg/dL — ABNORMAL HIGH (ref 8–23)
CO2: 22 mmol/L (ref 22–32)
Calcium: 7.8 mg/dL — ABNORMAL LOW (ref 8.9–10.3)
Chloride: 107 mmol/L (ref 98–111)
Creatinine, Ser: 1.81 mg/dL — ABNORMAL HIGH (ref 0.61–1.24)
GFR, Estimated: 38 mL/min — ABNORMAL LOW (ref >60)
Glucose, Bld: 129 mg/dL — ABNORMAL HIGH (ref 70–99)
Potassium: 4.5 mmol/L (ref 3.5–5.1)
Sodium: 137 mmol/L (ref 135–145)

## 2021-03-26 LAB — LACTIC ACID, PLASMA: Lactic Acid, Venous: 0.9 mmol/L (ref 0.5–1.9)

## 2021-03-26 LAB — TSH: TSH: 1.651 u[IU]/mL (ref 0.350–4.500)

## 2021-03-26 MED ORDER — LEVOTHYROXINE SODIUM 50 MCG PO TABS
175.0000 ug | ORAL_TABLET | Freq: Once | ORAL | Status: AC
  Administered 2021-03-26: 175 ug via ORAL
  Filled 2021-03-26: qty 1

## 2021-03-26 MED ORDER — LEVOTHYROXINE SODIUM 75 MCG PO TABS
150.0000 ug | ORAL_TABLET | ORAL | Status: DC
  Administered 2021-03-27 – 2021-03-29 (×2): 150 ug via ORAL
  Filled 2021-03-26: qty 1
  Filled 2021-03-26: qty 2

## 2021-03-26 MED ORDER — ASPIRIN 81 MG PO CHEW
81.0000 mg | CHEWABLE_TABLET | Freq: Every day | ORAL | Status: DC
  Administered 2021-03-26 – 2021-03-29 (×4): 81 mg via ORAL
  Filled 2021-03-26 (×4): qty 1

## 2021-03-26 MED ORDER — METRONIDAZOLE 500 MG/100ML IV SOLN
500.0000 mg | Freq: Two times a day (BID) | INTRAVENOUS | Status: DC
  Administered 2021-03-26 – 2021-03-27 (×4): 500 mg via INTRAVENOUS
  Filled 2021-03-26 (×4): qty 100

## 2021-03-26 MED ORDER — FAMOTIDINE 20 MG PO TABS
20.0000 mg | ORAL_TABLET | ORAL | Status: DC
  Administered 2021-03-26 – 2021-03-28 (×2): 20 mg via ORAL
  Filled 2021-03-26 (×2): qty 1

## 2021-03-26 MED ORDER — ONDANSETRON HCL 4 MG PO TABS: 4.0000 mg | ORAL_TABLET | Freq: Four times a day (QID) | ORAL | Status: DC | PRN

## 2021-03-26 MED ORDER — HYDROCORTISONE 10 MG PO TABS
10.0000 mg | ORAL_TABLET | Freq: Every day | ORAL | Status: DC
  Administered 2021-03-26 – 2021-03-29 (×4): 10 mg via ORAL
  Filled 2021-03-26 (×4): qty 1

## 2021-03-26 MED ORDER — HYDROCORTISONE 5 MG PO TABS: 5.0000 mg | ORAL_TABLET | ORAL | Status: DC

## 2021-03-26 MED ORDER — FAMOTIDINE 20 MG PO TABS: 20.0000 mg | ORAL_TABLET | Freq: Every day | ORAL | Status: DC | PRN

## 2021-03-26 MED ORDER — DILTIAZEM HCL ER COATED BEADS 120 MG PO CP24
120.0000 mg | ORAL_CAPSULE | Freq: Every morning | ORAL | Status: DC
  Administered 2021-03-26 – 2021-03-29 (×4): 120 mg via ORAL
  Filled 2021-03-26 (×4): qty 1

## 2021-03-26 MED ORDER — SODIUM CHLORIDE 0.9 % IV SOLN: INTRAVENOUS | Status: DC

## 2021-03-26 MED ORDER — ACETAMINOPHEN 650 MG RE SUPP: 650.0000 mg | Freq: Four times a day (QID) | RECTAL | Status: DC | PRN

## 2021-03-26 MED ORDER — HYDROCORTISONE 5 MG PO TABS
5.0000 mg | ORAL_TABLET | ORAL | Status: DC
  Administered 2021-03-26 – 2021-03-28 (×2): 5 mg via ORAL
  Filled 2021-03-26 (×5): qty 1

## 2021-03-26 MED ORDER — ONDANSETRON HCL 4 MG/2ML IJ SOLN
4.0000 mg | Freq: Four times a day (QID) | INTRAMUSCULAR | Status: DC | PRN
  Administered 2021-03-28: 4 mg via INTRAVENOUS
  Filled 2021-03-26: qty 2

## 2021-03-26 MED ORDER — ENOXAPARIN SODIUM 40 MG/0.4ML IJ SOSY
40.0000 mg | PREFILLED_SYRINGE | Freq: Every day | INTRAMUSCULAR | Status: DC
  Administered 2021-03-27 – 2021-03-29 (×3): 40 mg via SUBCUTANEOUS
  Filled 2021-03-26 (×3): qty 0.4

## 2021-03-26 MED ORDER — SODIUM CHLORIDE 0.9 % IV SOLN
2.0000 g | INTRAVENOUS | Status: DC
  Administered 2021-03-26: 2 g via INTRAVENOUS
  Filled 2021-03-26: qty 2

## 2021-03-26 MED ORDER — TRAZODONE HCL 50 MG PO TABS
150.0000 mg | ORAL_TABLET | Freq: Every evening | ORAL | Status: DC | PRN
  Administered 2021-03-27: 150 mg via ORAL
  Filled 2021-03-26: qty 1

## 2021-03-26 MED ORDER — ENOXAPARIN SODIUM 30 MG/0.3ML IJ SOSY
30.0000 mg | PREFILLED_SYRINGE | Freq: Every day | INTRAMUSCULAR | Status: DC
  Filled 2021-03-26: qty 0.3

## 2021-03-26 MED ORDER — VANCOMYCIN HCL IN DEXTROSE 1-5 GM/200ML-% IV SOLN
1000.0000 mg | INTRAVENOUS | Status: DC
  Filled 2021-03-26: qty 200

## 2021-03-26 MED ORDER — ACETAMINOPHEN 325 MG PO TABS: 650.0000 mg | ORAL_TABLET | Freq: Four times a day (QID) | ORAL | Status: DC | PRN

## 2021-03-26 MED ORDER — SODIUM CHLORIDE 0.9 % IV SOLN
2.0000 g | Freq: Once | INTRAVENOUS | Status: AC
  Administered 2021-03-26: 2 g via INTRAVENOUS
  Filled 2021-03-26: qty 2

## 2021-03-26 MED ORDER — LEVOTHYROXINE SODIUM 50 MCG PO TABS
175.0000 ug | ORAL_TABLET | ORAL | Status: DC
  Administered 2021-03-28: 175 ug via ORAL
  Filled 2021-03-26: qty 1

## 2021-03-26 NOTE — H&P (Signed)
History and Physical    Patient: [name] WPV:[id number] DOB: 04-Aug-1945 DOA: 03/25/2021 DOS: the patient was seen and examined on 03/26/2021 PCP: [name], MD  Patient coming from: Home  Chief Complaint:  Chief Complaint  Patient presents with   Code Sepsis    HPI: [name] is a 76 y.o. male with medical history significant of osteoarthritis, stage IIIa chronic kidney disease, right nephrectomy, history of renal cell carcinoma, history of lung cancer, CAD, GERD, heart murmur, hyperlipidemia, hypertension, incisional hernia who was brought to the emergency department due to decreased daily activity, fever, fatigue and oral intake.  The patient stated since that week and he has been feeling fatigued, his appetite is decreased.  He is stated that he had 3 episodes of emesis yesterday, but denied diarrhea, constipation, melena or hematochezia.  No flank pain, dysuria, frequency or hematuria.  He did not have a fever until yesterday as far as he is was aware.  No chills or night sweats.  Occasional cough, but no sputum production.  No wheezing or hemoptysis.  No chest pain, palpitations, dizziness, diaphoresis orthopnea or pitting edema of the lower extremities.  No polyuria, polydipsia, polyphagia or blurry vision.  ED: Initial vital signs were temperature 100.3 F, Tmax 102.8 F, initial pulse is 97, respirations 22, BP 109/58 mmHg and O2 sat was 98% on nasal cannula at 2 LPM.  The patient received acetaminophen 1000 mg p.o. x1 3000 mL of LR bolus, hydrocortisone 100 mg IVP, cefepime, metronidazole and vancomycin.  Lab work: Urinalysis shows small hemoglobinuria but was otherwise unremarkable.  CBC was normal with a white count 7.6, hemoglobin 13.9 g/dL and platelets 231.  PT was 15.2, INR 1.2 and PTT 41.  Coronavirus and influenza PCR was negative.  CMP showed a glucose of 100, BUN of 40, creatinine 2.79 calcium 8.7 mg/L.  Total protein was 8.3 g/dL.  The rest of the electrolytes and  LFTs were normal.  In the last 15 months baseline creatinine had ranged from 1.35 to 1.55 mg/dL.  Lactic acid 2.0 mmol/L and then 0.9 mmol/L.  TSH was normal.  Imaging: CT head without contrast did not show any acute intracranial findings.  CT renal stone protocol with no acute finding in the abdomen or pelvis.  However there is evidence of progressive malignancy in the upper right retroperitoneum given enlarged adenopathies.  There was colonic diverticulosis.  There was aortic atherosclerosis.  Please see images and full radiology report for further details.  Review of Systems: As mentioned in the history of present illness. All other systems reviewed and are negative. Past Medical History:  Diagnosis Date   Arthritis    Chronic kidney disease    only has one kidney    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 02/01/2015   Coronary artery disease    followed by Dr.Tilley   GERD (gastroesophageal reflux disease)    Heart murmur    High cholesterol    Hx of cancer of lung 1980's   Hypertension    Incisional hernia 08/01/2015   Lung cancer ([facility]) 1993   Lung metastases ([place]) 2019   Recurrent umbilical hernia 7/48/2707   Right renal mass    Past Surgical History:  Procedure Laterality Date   APPENDECTOMY  age 67   CHOLECYSTECTOMY  5's   LAPAROSCOPIC LYSIS OF ADHESIONS N/A 08/01/2015   Procedure: LAPAROSCOPIC LYSIS OF ADHESIONS;  Surgeon: Michael Boston, MD;  Location: [facility];  Service: General;  Laterality: N/A;  LUNG LOBECTOMY  1992   lung cancer- patient has staples in lung not to have MRI per patient    PILONIDAL CYST EXCISION  2011   Dr Harlow Asa   ROBOTIC ASSITED PARTIAL NEPHRECTOMY Right 02/01/2015   Procedure: RIGHT ROBOTIC ASSISTED LAPAROSOCOPY NEPHRECTOMY;  Surgeon: Ardis Hughs, MD;  Location: [facility];  Service: Urology;  Laterality: Right;   VENTRAL HERNIA REPAIR N/A 08/01/2015   Procedure: LAPAROSCOPIC VENTRAL WALL HERNIA WITH MESH;  Surgeon:  Michael Boston, MD;  Location: [facility];  Service: General;  Laterality: N/A;   Social History:  reports that he quit smoking about 33 years ago. His smoking use included cigarettes. He has a 20.00 pack-year smoking history. He has never used smokeless tobacco. He reports current alcohol use. He reports that he does not use drugs.  Allergies  Allergen Reactions   Benadryl [Diphenhydramine] Other (See Comments)    Severe restless legs    Family History  Problem Relation Age of Onset   Heart attack Father     Prior to Admission medications   Medication Sig Start Date End Date Taking? Authorizing Provider  acetaminophen (TYLENOL) 325 MG tablet Take 2 tablets (650 mg total) by mouth every 6 (six) hours as needed for mild pain or headache (over the counter). Patient taking differently: Take 650 mg by mouth every 6 (six) hours as needed for mild pain or headache. 03/16/18  Yes Rai, Ripudeep K, MD  aspirin 81 MG chewable tablet Chew 81 mg by mouth daily.   Yes [provider]  Cholecalciferol (VITAMIN D3) 50 MCG (2000 UT) TABS Take 2,000 Units by mouth daily.   Yes [provider]  diltiazem (CARDIZEM CD) 120 MG 24 hr capsule Take 120 mg by mouth in the morning. 03/31/18  Yes [provider]  famotidine (PEPCID) 20 MG tablet Take 20 mg by mouth See admin instructions. Take 20 mg by mouth at 3 PM daily and an additional 20 mg once a day as needed for heartburn or indigestion 05/05/19  Yes [provider]  hydrocortisone (CORTEF) 10 MG tablet Take 5-10 mg by mouth See admin instructions. Take 10 mg by mouth in the morning and 5 mg in the afternoon   Yes [provider]  levothyroxine (SYNTHROID) 150 MCG tablet TAKE 1 TABLET BY MOUTH EVERY OTHER DAY Patient taking differently: Take 150 mcg by mouth See admin instructions. Take 150 mcg by mouth every other morning before breakfast 03/17/21  Yes Philemon Kingdom, MD  levothyroxine (SYNTHROID) 175 MCG tablet TAKE 1  TABLET BY MOUTH EVERY OTHER DAY Patient taking differently: Take 175 mcg by mouth See admin instructions. Take 175 mcg by mouth every other morning before breakfast 03/17/21  Yes Philemon Kingdom, MD  Multiple Vitamins-Minerals (CENTRUM SILVER 50+MEN) TABS Take 1 tablet by mouth daily with breakfast.   Yes [provider]  traZODone (DESYREL) 150 MG tablet Take 150 mg by mouth at bedtime as needed for sleep.   Yes [provider]  hydrocortisone (CORTEF) 5 MG tablet Take 2 tablets in am and 1 tablet in pm, ~ 2pm. Patient not taking: Reported on 03/25/2021 06/05/20   Philemon Kingdom, MD  Multiple Vitamin (MULTIVITAMIN WITH MINERALS) TABS tablet Take 1 tablet by mouth daily. Patient not taking: Reported on 03/25/2021 03/16/18   Rai, Ripudeep K, MD  temazepam (RESTORIL) 7.5 MG capsule Take 1 capsule (7.5 mg total) by mouth at bedtime as needed for sleep. Patient not taking: Reported on 03/25/2021 03/22/18   Tanner,  Lyndon Code., PA-C    Physical Exam: Vitals:   03/26/21 0801 03/26/21 0837 03/26/21 0850 03/26/21 0900  BP: 106/61 (!) 106/59  (!) 102/52  Pulse: 66 67  68  Resp: 20 14  14   Temp:   (!) 97.4 F (36.3 C)   TempSrc:   Oral   SpO2: 100% 100%  100%  Weight:      Height:       Physical Exam Vitals and nursing note reviewed.  Constitutional:      Appearance: He is obese.  HENT:     Head: Normocephalic.     Nose: No rhinorrhea.     Mouth/Throat:     Mouth: Mucous membranes are moist.  Eyes:     Pupils: Pupils are equal, round, and reactive to light.  Cardiovascular:     Rate and Rhythm: Normal rate and regular rhythm.  Pulmonary:     Effort: Pulmonary effort is normal.     Breath sounds: No wheezing or rales.  Abdominal:     General: Bowel sounds are normal. There is no distension.     Palpations: Abdomen is soft.     Tenderness: There is no abdominal tenderness.  Musculoskeletal:        General: Normal range of motion.     Cervical back: Normal range of motion and  neck supple.     Comments: Moderate generalized weakness.  Skin:    General: Skin is warm and dry.  Neurological:     General: No focal deficit present.     Mental Status: He is alert and oriented to person, place, and time.  Psychiatric:        Mood and Affect: Mood normal.        Behavior: Behavior normal.     EKG: Vent. rate 100 BPM PR interval 164 ms QRS duration 87 ms QT/QTcB 342/442 ms P-R-T axes 53 -18 55 Sinus tachycardia Ventricular premature complex Borderline left axis deviation  Data Reviewed:  There are no new results to review at this time.  Assessment and Plan: Principal Problem:   Generalized weakness In the setting of   Sepsis due to undetermined organism ([place]) Admit to PCU/inpatient. Continue IV fluids. Monitor intake and output. Continue cefepime per pharmacy. Continue metronidazole 500 mg every 3 hours. Continue vancomycin per pharmacy. Follow-up CBC and CMP. Follow blood culture and sensitivity.  Active Problems:   AKI (acute kidney injury) ([place]) Superimposed on on on   Stage 3a chronic kidney disease (CKD) ([facility]) Continue IV fluids Avoid hypotension. Avoid nephrotoxic agents. Monitor intake and output. Follow-up renal function electrolytes.    GERD (gastroesophageal reflux disease) Continue famotidine 20 mg p.o. twice daily.    Coronary artery disease   Hyperlipidemia   Atherosclerosis of aorta ([facility]) Continue aspirin 81 mg p.o. daily. Not on hyperlipidemia treatment. Follow-up with PCP after discharge.    Essential hypertension Continue diltiazem 1020 mg p.o. daily. Monitor BP and heart rate.    Primary hypothyroidism. TSH was normal. Continue levothyroxine 150 mcg every other day. Continue levothyroxine 175 mcg every other day. Follow-up with primary care provider as an outpatient.    Adrenal insufficiency ([facility]) Received Solu-Cortef 100 mg last night. Continue daily oral hydrocortisone.       Advance Care Planning:    Code Status: Full code.  Consults:   Family Communication:   Severity of Illness: The appropriate patient status for this patient is INPATIENT. Inpatient status is judged to be reasonable and necessary in order to  provide the required intensity of service to ensure the patient's safety. The patient's presenting symptoms, physical exam findings, and initial radiographic and laboratory data in the context of their chronic comorbidities is felt to place them at high risk for further clinical deterioration. Furthermore, it is not anticipated that the patient will be medically stable for discharge from the hospital within 2 midnights of admission.   * I certify that at the point of admission it is my clinical judgment that the patient will require inpatient hospital care spanning beyond 2 midnights from the point of admission due to high intensity of service, high risk for further deterioration and high frequency of surveillance required.*  Author: Reubin Milan, MD 03/26/2021 10:07 AM  For on call review [email or web address].   This document was prepared using Dragon voice recognition software and may contain some unintended transcription errors.

## 2021-03-26 NOTE — Progress Notes (Signed)
Pharmacy Antibiotic Note  [name] is a 76 y.o. male presented to the ED on 03/25/2021 with fever and AMS.  Pharmacy has been consulted to dose vancomycin and cefepime for suspected sepsis from unk source.  Today, 03/26/2021: - scr 2.78 on 2/7 (crcl~24) - Tmax 102.8 - on 03/25/21, pt received vancomycin 1gm x1 at 2257,  cefepime 2gm x1 at 2050, and flagyl at 2150  Plan: - cefepime 2gm q24h - vancomycin 1gm IV q36h for est AUC 450  - flagyl 500 mg q12h per MD - monitor renal function closely and adjust dose if/when aprropriate __________________________________  Height: 5\' 9"  (175.3 cm) Weight: 90.7 kg (200 lb) IBW/kg (Calculated) : 70.7  Temp (24hrs), Avg:100.2 F (37.9 C), Min:97.4 F (36.3 C), Max:102.8 F (39.3 C)  Recent Labs  Lab 03/25/21 2024 03/26/21 0528  WBC 7.6  --   CREATININE 2.78*  --   LATICACIDVEN 2.0* 0.9    Estimated Creatinine Clearance: 24.4 mL/min (A) (by C-G formula based on SCr of 2.78 mg/dL (H)).    Allergies  Allergen Reactions   Benadryl [Diphenhydramine] Other (See Comments)    Severe restless legs     Thank you for allowing pharmacy to be a part of this patients care.  Lynelle Doctor 03/26/2021 10:39 AM

## 2021-03-26 NOTE — ED Notes (Signed)
Pt cleaned from BM, sheets changed

## 2021-03-26 NOTE — ED Provider Notes (Signed)
°  Physical Exam  BP (!) 102/51    Pulse 79    Temp (!) 102.8 F (39.3 C) (Oral)    Resp 18    Ht 5\' 9"  (1.753 m)    Wt 90.7 kg    SpO2 96%    BMI 29.53 kg/m   Physical Exam Vitals and nursing note reviewed.  Constitutional:      General: He is not in acute distress.    Appearance: He is well-developed. He is not diaphoretic.  HENT:     Head: Normocephalic and atraumatic.  Cardiovascular:     Rate and Rhythm: Normal rate and regular rhythm.     Heart sounds: No murmur heard.   No friction rub.  Pulmonary:     Effort: Pulmonary effort is normal. No respiratory distress.     Breath sounds: Normal breath sounds. No wheezing or rales.  Abdominal:     General: Bowel sounds are normal. There is no distension.     Palpations: Abdomen is soft.     Tenderness: There is no abdominal tenderness.  Musculoskeletal:        General: Normal range of motion.     Cervical back: Normal range of motion and neck supple.  Skin:    General: Skin is warm and dry.  Neurological:     Mental Status: He is alert and oriented to person, place, and time.     Coordination: Coordination normal.    Procedures  Procedures  ED Course / MDM   Clinical Course as of 03/26/21 0417  Tue Mar 25, 2021  2132 Lactic acid level elevated at 2 [JK]  2132 COVID and flu are negative [JK]  2133 Comprehensive metabolic panel(!) Metabolic panel shows increased BUN and creatinine [JK]  2133 CBC WITH DIFFERENTIAL CBC normal [JK]  2133 Chest x-ray without acute pneumonia [JK]  2325 Prior records reviewed.  Patient does have history of adrenal insufficiency.  We will give a dose of hydrocortisone [JK]  2344 Blood pressure has improved and remained stable after IV fluid resuscitation [JK]    Clinical Course User Index [JK] Dorie Rank, MD   Medical Decision Making Amount and/or Complexity of Data Reviewed Labs: ordered. Decision-making details documented in ED Course. Radiology: ordered. ECG/medicine tests:  ordered.  Risk OTC drugs. Prescription drug management.   Care assumed from Dr. Tomi Bamberger at shift change.  Patient awaiting results of ultrasound and CT and urinalysis.  Patient found by family members at home appearing weak and disoriented.  He was found here to be febrile and borderline hypoxic.    Presentation concerning for sepsis.  Broad spectrum antibiotics administered and cultures obtained.  IV fluids initiated.  Head CT and urinalysis have returned unremarkable.  Care discussed with Dr. Alcario Drought from the hospitalist service who agrees to admit.       Veryl Speak, MD 03/26/21 [id number]

## 2021-03-27 ENCOUNTER — Other Ambulatory Visit: Payer: Self-pay

## 2021-03-27 DIAGNOSIS — A419 Sepsis, unspecified organism: Secondary | ICD-10-CM | POA: Diagnosis present

## 2021-03-27 DIAGNOSIS — N1831 Chronic kidney disease, stage 3a: Secondary | ICD-10-CM

## 2021-03-27 DIAGNOSIS — E2740 Unspecified adrenocortical insufficiency: Secondary | ICD-10-CM

## 2021-03-27 DIAGNOSIS — C641 Malignant neoplasm of right kidney, except renal pelvis: Secondary | ICD-10-CM

## 2021-03-27 DIAGNOSIS — N179 Acute kidney failure, unspecified: Principal | ICD-10-CM

## 2021-03-27 DIAGNOSIS — E039 Hypothyroidism, unspecified: Secondary | ICD-10-CM

## 2021-03-27 DIAGNOSIS — I700 Atherosclerosis of aorta: Secondary | ICD-10-CM

## 2021-03-27 DIAGNOSIS — K219 Gastro-esophageal reflux disease without esophagitis: Secondary | ICD-10-CM

## 2021-03-27 DIAGNOSIS — E785 Hyperlipidemia, unspecified: Secondary | ICD-10-CM

## 2021-03-27 DIAGNOSIS — I2510 Atherosclerotic heart disease of native coronary artery without angina pectoris: Secondary | ICD-10-CM

## 2021-03-27 DIAGNOSIS — I10 Essential (primary) hypertension: Secondary | ICD-10-CM

## 2021-03-27 DIAGNOSIS — R531 Weakness: Secondary | ICD-10-CM

## 2021-03-27 LAB — CBC WITH DIFFERENTIAL/PLATELET
Abs Immature Granulocytes: 0.01 10*3/uL (ref 0.00–0.07)
Basophils Absolute: 0 10*3/uL (ref 0.0–0.1)
Basophils Relative: 1 %
Eosinophils Absolute: 0.2 10*3/uL (ref 0.0–0.5)
Eosinophils Relative: 5 %
HCT: 26.1 % — ABNORMAL LOW (ref 39.0–52.0)
Hemoglobin: 8.5 g/dL — ABNORMAL LOW (ref 13.0–17.0)
Immature Granulocytes: 0 %
Lymphocytes Relative: 20 %
Lymphs Abs: 0.9 10*3/uL (ref 0.7–4.0)
MCH: 30 pg (ref 26.0–34.0)
MCHC: 32.6 g/dL (ref 30.0–36.0)
MCV: 92.2 fL (ref 80.0–100.0)
Monocytes Absolute: 0.5 10*3/uL (ref 0.1–1.0)
Monocytes Relative: 11 %
Neutro Abs: 2.8 10*3/uL (ref 1.7–7.7)
Neutrophils Relative %: 63 %
Platelets: 141 10*3/uL — ABNORMAL LOW (ref 150–400)
RBC: 2.83 MIL/uL — ABNORMAL LOW (ref 4.22–5.81)
RDW: 12.6 % (ref 11.5–15.5)
WBC: 4.4 10*3/uL (ref 4.0–10.5)
nRBC: 0 % (ref 0.0–0.2)

## 2021-03-27 LAB — COMPREHENSIVE METABOLIC PANEL
ALT: 16 U/L (ref 0–44)
AST: 21 U/L (ref 15–41)
Albumin: 2.9 g/dL — ABNORMAL LOW (ref 3.5–5.0)
Alkaline Phosphatase: 21 U/L — ABNORMAL LOW (ref 38–126)
Anion gap: 6 (ref 5–15)
BUN: 30 mg/dL — ABNORMAL HIGH (ref 8–23)
CO2: 20 mmol/L — ABNORMAL LOW (ref 22–32)
Calcium: 6.9 mg/dL — ABNORMAL LOW (ref 8.9–10.3)
Chloride: 115 mmol/L — ABNORMAL HIGH (ref 98–111)
Creatinine, Ser: 1.21 mg/dL (ref 0.61–1.24)
GFR, Estimated: >60 mL/min (ref >60)
Glucose, Bld: 78 mg/dL (ref 70–99)
Potassium: 3.5 mmol/L (ref 3.5–5.1)
Sodium: 141 mmol/L (ref 135–145)
Total Bilirubin: 0.2 mg/dL — ABNORMAL LOW (ref 0.3–1.2)
Total Protein: 5.2 g/dL — ABNORMAL LOW (ref 6.5–8.1)

## 2021-03-27 LAB — URINE CULTURE: Culture: NO GROWTH

## 2021-03-27 MED ORDER — SODIUM CHLORIDE 0.9 % IV SOLN
2.0000 g | Freq: Two times a day (BID) | INTRAVENOUS | Status: DC
  Administered 2021-03-27 (×2): 2 g via INTRAVENOUS
  Filled 2021-03-27 (×3): qty 2

## 2021-03-27 MED ORDER — VANCOMYCIN HCL 1500 MG/300ML IV SOLN
1500.0000 mg | Freq: Every day | INTRAVENOUS | Status: DC
  Administered 2021-03-27: 1500 mg via INTRAVENOUS
  Filled 2021-03-27: qty 300

## 2021-03-27 NOTE — Assessment & Plan Note (Addendum)
Improved.  Received IV fluids during hospitalization.  Latest creatinine of 1.4 improved from 1.8 on 03/26/2021.  Patient was encouraged to increase oral fluid intake on discharge

## 2021-03-27 NOTE — Assessment & Plan Note (Addendum)
Patient did have a CT scan this time with prominent lymph node and mass.  Communicated with the patient's daughter on the phone on 03/27/2021.  Patient follows up with Dr Alen Blew as outpatient.  He is scheduled to have an appointment first week of March.

## 2021-03-27 NOTE — ED Notes (Signed)
Placed on hospital bed for comfort

## 2021-03-27 NOTE — Assessment & Plan Note (Addendum)
On CKD stage IIIa.

## 2021-03-27 NOTE — Hospital Course (Addendum)
[name] is a 76 y.o. male with past medical history of osteoarthritis, stage IIIa chronic kidney disease status post nephrectomy on the right, history of lung cancer, coronary artery disease, GERD, hyperlipidemia, hypertension presented to the hospital with decreased activity, fever fatigue and overall decline in general condition with decreased appetite.  In the ED patient was noted to be febrile with a temperature of 100.3 F.  CBC within normal range.  COVID and influenza was negative.  Creatinine was elevated at 2.7.  Lactate was 2.0.  CT head was without acute findings.  CT renal stone protocol without acute findings but there was progression of malignancy in the right upper retroperitoneal area with enlarged lymphadenopathy.  In the ED, patient received LR bolus, hydrocortisone 100 mg IVP, cefepime, metronidazole and vancomycin and was admitted to hospital for further evaluation and treatment.

## 2021-03-27 NOTE — Assessment & Plan Note (Addendum)
No acute issues.  Continue aspirin.  Not on statins at this time.

## 2021-03-27 NOTE — Assessment & Plan Note (Addendum)
Likely secondary to sepsis.  Patient has been seen by physical therapy and recommended no physical therapy needs.  Patient feels much stronger today.

## 2021-03-27 NOTE — Assessment & Plan Note (Signed)
Continue famotidine 

## 2021-03-27 NOTE — Progress Notes (Signed)
Pharmacy Antibiotic Note  [name] is a 76 y.o. male presented to the ED on 03/25/2021 with fever and AMS.  Pharmacy has been consulted to dose vancomycin and cefepime for suspected sepsis from unk source.  Today, 03/27/2021: Day 2 full abx SCr continues to improve WBC remain WNL Last fever 2/8 AM  Plan: Increase vancomycin to 1500 mg IV q24 hr SCr q48 while on vanc Increase cefepime to 2g IV q12 hr Flagyl per MD; dosing appropriate  __________________________________  Height: 5\' 9"  (175.3 cm) Weight: 90.7 kg (200 lb) IBW/kg (Calculated) : 70.7  Temp (24hrs), Avg:98.4 F (36.9 C), Min:97.8 F (36.6 C), Max:98.9 F (37.2 C)  Recent Labs  Lab 03/25/21 2024 03/26/21 0528 03/26/21 1114 03/27/21 0458  WBC 7.6  --   --  4.4  CREATININE 2.78*  --  1.81* 1.21  LATICACIDVEN 2.0* 0.9  --   --      Estimated Creatinine Clearance: 56 mL/min (by C-G formula based on SCr of 1.21 mg/dL).    Allergies  Allergen Reactions   Benadryl [Diphenhydramine] Other (See Comments)    Severe restless legs   Antimicrobials this admission: 2/8 vancomycin >>  2/8 cefepime >>  2/8 metronidazole >>   Dose adjustments this admission: 2/9: incr vanc 1000 q36 >> 1500 q24 2/9: incr cefepime 2 q24 >> q12  Microbiology results: 2/8 BCx: ngtd 2/8 UCx: NGF   Thank you for allowing pharmacy to be a part of this patients care.  Jayvon Mounger A 03/27/2021 12:40 PM

## 2021-03-27 NOTE — Plan of Care (Signed)
°  Problem: Clinical Measurements: Goal: Diagnostic test results will improve Outcome: Progressing   Problem: Clinical Measurements: Goal: Respiratory complications will improve Outcome: Progressing   Problem: Coping: Goal: Level of anxiety will decrease Outcome: Progressing   Problem: Nutrition: Goal: Adequate nutrition will be maintained Outcome: Progressing   Problem: Skin Integrity: Goal: Risk for impaired skin integrity will decrease Outcome: Progressing

## 2021-03-27 NOTE — Assessment & Plan Note (Deleted)
Continue Synthroid °

## 2021-03-27 NOTE — ED Notes (Signed)
Ambulatory to bathroom. Gait steady. No signs of distress.

## 2021-03-27 NOTE — Assessment & Plan Note (Addendum)
Initially received vancomycin, metronidazole and cefepime.  Blood cultures negative in.  Urine culture negative so far.  No fever or leukocytosis.  Chest x-ray with partial right lung resection no evidence of pulmonary disease.  Antibiotics were discontinued and patient was observed after that without any fever or leukocytosis.  Patient continued to clinically improve.

## 2021-03-27 NOTE — Assessment & Plan Note (Signed)
Continue Synthroid °

## 2021-03-27 NOTE — Assessment & Plan Note (Addendum)
Continue Solu-Cortef from home.

## 2021-03-27 NOTE — Assessment & Plan Note (Signed)
We will continue to monitor.

## 2021-03-27 NOTE — Assessment & Plan Note (Addendum)
On Cardizem at home.  This will be resumed on discharge.

## 2021-03-27 NOTE — Progress Notes (Signed)
PROGRESS NOTE    [name]  KNL:[id number] DOB: 1945-10-13 DOA: 03/25/2021 PCP: [name], MD    Brief Narrative:  [name] is a 76 y.o. male with past medical history of osteoarthritis, stage IIIa chronic kidney disease status post nephrectomy on the right, history of lung cancer, coronary artery disease, GERD, hyperlipidemia, hypertension presented to the hospital with decreased activity, fever fatigue and overall decline in general condition with decreased appetite.  In the ED patient was noted to be febrile with a temperature of 100.3 F.  CBC within normal range.  COVID and influenza was negative.  Creatinine was elevated at 2.7.  Lactate was 2.0.  CT head was without acute findings.  CT renal stone protocol without acute findings but there was progression of malignancy in the right upper retroperitoneal area with enlarged lymphadenopathy.  In the ED, patient received LR bolus, hydrocortisone 100 mg IVP, cefepime, metronidazole and vancomycin and was admitted to hospital for further evaluation and treatment..    Assessment and Plan: * Generalized weakness Likely secondary to sepsis.  We will get PT evaluation.  Continue fluids and antibiotics for now.  Sepsis due to undetermined organism [facility])- (present on admission) Continues vancomycin, metronidazole and cefepime.  Follow blood cultures, urine cultures.  Urine culture negative so far.  WBC at 4.4.  Chest x-ray with partial right lung resection no evidence of pulmonary disease.  Temperature max of 98.9 F but temperature of 102.8 F on 03/26/2021.  We will de-escalate antibiotic by tomorrow if negative cultures.  Get a respiratory viral panel since patient has mild cough.  Continue to monitor fever curve.  Acute renal failure superimposed on stage 3 chronic kidney disease ([place])- (present on admission)   Received IV fluids.  Continue to monitor closely.  Latest creatinine of 1.2 improved from 1.8 on 03/26/2021.  Primary  hypothyroidism- (present on admission) Continue Synthroid.  Adrenal insufficiency ([place])- (present on admission) Continue Solu-Cortef.  Essential hypertension- (present on admission) On Cardizem at home.  Continue while in the hospital.  Latest blood pressure of 101/52  Coronary artery disease- (present on admission) No acute issues.  Continue aspirin.  Not on statins at this time.  GERD (gastroesophageal reflux disease)- (present on admission) Continue famotidine.  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  Communicated with the patient's daughter on the phone.  Patient follows up with Dr Alen Blew as outpatient.  He is scheduled to have an appointment first week of March.    DVT prophylaxis: enoxaparin (LOVENOX) injection 40 mg Start: 03/27/21 1000   Code Status:     Code Status: Full Code  Disposition: Likely home.  We will get PT evaluation.  Status is: Inpatient Remains inpatient appropriate because: Possible sepsis, generalized weakness, follow cultures,    Family Communication: Spoke with the patient's daughter on the phone and updated her about the clinical condition of the patient.  Consultants:  None  Procedures:  None  Antimicrobials:  Vancomycin cefepime, metronidazole 03/25/21>  Anti-infectives (From admission, onward)    Start     Dose/Rate Route Frequency Ordered Stop   03/27/21 1300  ceFEPIme (MAXIPIME) 2 g in sodium chloride 0.9 % 100 mL IVPB        2 g 200 mL/hr over 30 Minutes Intravenous Every 12 hours 03/27/21 1247     03/27/21 1245  vancomycin (VANCOREADY) IVPB 1500 mg/300 mL  1,500 mg 150 mL/hr over 120 Minutes Intravenous Daily 03/27/21 1240     03/27/21 1200  vancomycin (VANCOCIN) IVPB 1000 mg/200 mL premix  Status:  Discontinued        1,000 mg 200 mL/hr over 60 Minutes Intravenous Every 36 hours 03/26/21 1052 03/27/21  1240   03/26/21 2100  ceFEPIme (MAXIPIME) 2 g in sodium chloride 0.9 % 100 mL IVPB  Status:  Discontinued        2 g 200 mL/hr over 30 Minutes Intravenous Every 24 hours 03/26/21 1052 03/27/21 1247   03/26/21 1030  ceFEPIme (MAXIPIME) 2 g in sodium chloride 0.9 % 100 mL IVPB        2 g 200 mL/hr over 30 Minutes Intravenous  Once 03/26/21 1019 03/26/21 1206   03/26/21 1030  metroNIDAZOLE (FLAGYL) IVPB 500 mg        500 mg 100 mL/hr over 60 Minutes Intravenous Every 12 hours 03/26/21 1019 04/02/21 1029   03/25/21 2030  ceFEPIme (MAXIPIME) 2 g in sodium chloride 0.9 % 100 mL IVPB        2 g 200 mL/hr over 30 Minutes Intravenous  Once 03/25/21 2023 03/25/21 2328   03/25/21 2030  metroNIDAZOLE (FLAGYL) IVPB 500 mg        500 mg 100 mL/hr over 60 Minutes Intravenous  Once 03/25/21 2023 03/25/21 2328   03/25/21 2030  vancomycin (VANCOCIN) IVPB 1000 mg/200 mL premix        1,000 mg 200 mL/hr over 60 Minutes Intravenous  Once 03/25/21 2023 03/26/21 0035       Subjective: Today, patient was seen and examined at bedside.Patient feels that patient has more energy today.  Denies any pain, nausea, vomiting, fever or chills.  Has had 1 bowel movement which was loose today.  Patient does have some cough.  Objective: Vitals:   03/27/21 0615 03/27/21 0900 03/27/21 1000 03/27/21 1319  BP:  107/66 102/60 (!) 113/52  Pulse: 74 79 66 65  Resp: (!) 26 15 16 17   Temp:  98.9 F (37.2 C)  98.4 F (36.9 C)  TempSrc:  Oral  Oral  SpO2: 98% 97% 94% 94%  Weight:      Height:        Intake/Output Summary (Last 24 hours) at 03/27/2021 1348 Last data filed at 03/27/2021 0935 Gross per 24 hour  Intake 291.05 ml  Output --  Net 291.05 ml   Filed Weights   03/25/21 2005  Weight: 90.7 kg    Physical Examination:  General: Obese built, not in obvious distress HENT:   No scleral pallor or icterus noted. Oral mucosa is moist.  Chest  Diminished breath sounds bilaterally. No crackles or wheezes.  CVS:  S1 &S2 heard. No murmur.  Regular rate and rhythm. Abdomen: Soft, nontender, nondistended.  Bowel sounds are heard.   Extremities: No cyanosis, clubbing or edema.  Peripheral pulses are palpable. Psych: Alert, awake and oriented, normal mood CNS:  No cranial nerve deficits.  Moderate generalized weakness. Skin: Warm and dry.  No rashes noted.  Data Reviewed:   CBC: Recent Labs  Lab 03/25/21 2024 03/27/21 0458  WBC 7.6 4.4  NEUTROABS 5.0 2.8  HGB 13.9 8.5*  HCT 44.1 26.1*  MCV 93.0 92.2  PLT 231 141*    Basic Metabolic Panel: Recent Labs  Lab 03/25/21 2024 03/26/21 1114 03/27/21 0458  NA 138 137 141  K 4.9 4.5 3.5  CL 105 107 115*  CO2 22 22 20*  GLUCOSE  100* 129* 78  BUN 40* 36* 30*  CREATININE 2.78* 1.81* 1.21  CALCIUM 8.7* 7.8* 6.9*    Liver Function Tests: Recent Labs  Lab 03/25/21 2024 03/27/21 0458  AST 20 21  ALT 15 16  ALKPHOS 43 21*  BILITOT 0.7 0.2*  PROT 8.3* 5.2*  ALBUMIN 4.6 2.9*     Radiology Studies: CT Head Wo Contrast  Result Date: 03/26/2021 CLINICAL DATA:  Mental status change of unknown cause. History of renal cancer and lung cancer. EXAM: CT HEAD WITHOUT CONTRAST TECHNIQUE: Contiguous axial images were obtained from the base of the skull through the vertex without intravenous contrast. RADIATION DOSE REDUCTION: This exam was performed according to the departmental dose-optimization program which includes automated exposure control, adjustment of the mA and/or kV according to patient size and/or use of iterative reconstruction technique. COMPARISON:  None. FINDINGS: Brain: No evidence of acute infarction, hemorrhage, hydrocephalus, extra-axial collection or mass lesion/mass effect. Mild cerebral atrophy. Vascular: Moderate intracranial arterial vascular calcifications. Skull: Calvarium appears intact. Sinuses/Orbits: Paranasal sinuses and mastoid air cells are clear. Other: None. IMPRESSION: No acute intracranial abnormalities.  Mild cerebral  atrophy. Electronically Signed   By: Lucienne Capers M.D.   On: 03/26/2021 01:17   DG Chest Port 1 View  Result Date: 03/25/2021 CLINICAL DATA:  Question of sepsis. EXAM: PORTABLE CHEST 1 VIEW COMPARISON:  CT 10/16/2020 FINDINGS: Postoperative changes on the right with associated volume loss in the right lung consistent with partial resection. No airspace disease or consolidation in the lungs. No pleural effusions. No pneumothorax. Mediastinal contours appear intact. Normal heart size. Old rib fractures. IMPRESSION: Postoperative partial right lung resection. No evidence of active pulmonary disease. Electronically Signed   By: Lucienne Capers M.D.   On: 03/25/2021 20:44   CT Renal Stone Study  Result Date: 03/26/2021 CLINICAL DATA:  76 year old male with history of flank pain. Microscopic hematuria. EXAM: CT ABDOMEN AND PELVIS WITHOUT CONTRAST TECHNIQUE: Multidetector CT imaging of the abdomen and pelvis was performed following the standard protocol without IV contrast. RADIATION DOSE REDUCTION: This exam was performed according to the departmental dose-optimization program which includes automated exposure control, adjustment of the mA and/or kV according to patient size and/or use of iterative reconstruction technique. COMPARISON:  CT the abdomen and pelvis 10/16/2020. FINDINGS: Lower chest: Atherosclerotic calcifications in the descending thoracic aorta as well as the left anterior descending, left circumflex and right coronary arteries. Hepatobiliary: Status post cholecystectomy. No definite suspicious cystic or solid hepatic lesions are confidently identified on today's noncontrast CT examination. Pancreas: No definite pancreatic mass or peripancreatic fluid collections or inflammatory changes are noted on today's noncontrast CT examination. Spleen: Unremarkable. Adrenals/Urinary Tract: Status post right radical nephrectomy. Extensive fat necrosis in the nephrectomy bed is grossly similar to the prior  study. Increasingly evident abnormal soft tissue in the upper left retroperitoneum, concerning for local recurrence of disease and/or nodal metastatic disease. Specific examples include a probable portacaval lymph node (axial image 29 of series 2) measuring 2.0 x 4.2 cm which is new compared to the prior examination, and a larger mass anterior to the inferior vena cava (axial image 31 of series 2 and coronal image 70 of series 4) which measures 4.1 x 2.9 x 3.8 cm, clearly enlarged compared to the prior study. No calcifications are noted within the collecting system of the left kidney, along the course of the left ureter or within the lumen of the urinary bladder. Unenhanced appearance of the left kidney, bilateral adrenal glands and urinary  bladder is normal. Stomach/Bowel: Unenhanced appearance of the stomach is normal. There is no pathologic dilatation of small bowel or colon. Numerous colonic diverticulae are noted, particularly in the sigmoid colon, without surrounding inflammatory changes to suggest an acute diverticulitis at this time. The appendix is not confidently identified and may be surgically absent. Regardless, there are no inflammatory changes noted adjacent to the cecum to suggest the presence of an acute appendicitis at this time. Vascular/Lymphatic: Aortic atherosclerosis. Soft tissue masses in the upper right retroperitoneum (detailed above), which may represent nodal metastatic disease. Multiple other prominent but nonenlarged retroperitoneal lymph nodes are noted, measuring up to 9 mm in short axis in the left para-aortic nodal station (axial image 40 of series 2). No definite pelvic lymphadenopathy. Reproductive: Prostate gland and seminal vesicles are unremarkable in appearance. Other: No significant volume of ascites.  No pneumoperitoneum. Musculoskeletal: There are no aggressive appearing lytic or blastic lesions noted in the visualized portions of the skeleton. IMPRESSION: 1. No acute  findings in the abdomen or pelvis to account for the patient's symptoms. Specifically, no urinary tract calculi or findings of urinary tract obstruction. 2. However, there is evidence of progressive malignancy in the upper right retroperitoneum. This may reflect locally recurrent renal cell carcinoma and/or regional nodal metastasis, as detailed above. 3. Colonic diverticulosis without evidence of acute diverticulitis at this time. 4. Aortic atherosclerosis. Electronically Signed   By: Vinnie Langton M.D.   On: 03/26/2021 06:26      LOS: 1 day    Flora Lipps, MD [facility] 03/27/2021, 1:48 PM

## 2021-03-28 LAB — CBC
HCT: 29.6 % — ABNORMAL LOW (ref 39.0–52.0)
Hemoglobin: 9.7 g/dL — ABNORMAL LOW (ref 13.0–17.0)
MCH: 30 pg (ref 26.0–34.0)
MCHC: 32.8 g/dL (ref 30.0–36.0)
MCV: 91.6 fL (ref 80.0–100.0)
Platelets: 146 10*3/uL — ABNORMAL LOW (ref 150–400)
RBC: 3.23 MIL/uL — ABNORMAL LOW (ref 4.22–5.81)
RDW: 12.8 % (ref 11.5–15.5)
WBC: 4.9 10*3/uL (ref 4.0–10.5)
nRBC: 0 % (ref 0.0–0.2)

## 2021-03-28 LAB — BASIC METABOLIC PANEL
Anion gap: 5 (ref 5–15)
BUN: 27 mg/dL — ABNORMAL HIGH (ref 8–23)
CO2: 26 mmol/L (ref 22–32)
Calcium: 8.1 mg/dL — ABNORMAL LOW (ref 8.9–10.3)
Chloride: 110 mmol/L (ref 98–111)
Creatinine, Ser: 1.43 mg/dL — ABNORMAL HIGH (ref 0.61–1.24)
GFR, Estimated: 50 mL/min — ABNORMAL LOW (ref >60)
Glucose, Bld: 106 mg/dL — ABNORMAL HIGH (ref 70–99)
Potassium: 3.8 mmol/L (ref 3.5–5.1)
Sodium: 141 mmol/L (ref 135–145)

## 2021-03-28 LAB — MAGNESIUM: Magnesium: 2 mg/dL (ref 1.7–2.4)

## 2021-03-28 NOTE — Progress Notes (Addendum)
PROGRESS NOTE    [name]  PXT:[id number] DOB: 1945/06/12 DOA: 03/25/2021 PCP: [name], MD    Brief Narrative:  [name] is a 76 y.o. male with past medical history of osteoarthritis, stage IIIa chronic kidney disease status post nephrectomy on the right, history of lung cancer, coronary artery disease, GERD, hyperlipidemia, hypertension presented to the hospital with decreased activity, fever fatigue and overall decline in general condition with decreased appetite.  In the ED patient was noted to be febrile with a temperature of 100.3 F.  CBC within normal range.  COVID and influenza was negative.  Creatinine was elevated at 2.7.  Lactate was 2.0.  CT head was without acute findings.  CT renal stone protocol without acute findings but there was progression of malignancy in the right upper retroperitoneal area with enlarged lymphadenopathy.  In the ED, patient received LR bolus, hydrocortisone 100 mg IVP, cefepime, metronidazole and vancomycin and was admitted to hospital for further evaluation and treatment.    Assessment and Plan: * Generalized weakness Likely secondary to sepsis.  Patient has been seen by physical therapy and recommended physical therapy needs.  Still complains of very fatigued and weak and does not feel ready for discharge.  SIRS (systemic inflammatory response syndrome) ([place])- (present on admission) Initially received vancomycin, metronidazole and cefepime.  Blood cultures negative in 2 days.  Urine culture negative so far.  No fever or leukocytosis.  Chest x-ray with partial right lung resection no evidence of pulmonary disease.   We will de-escalate antibiotic today.  Pending respiratory viral panel  Acute renal failure superimposed on stage 3 chronic kidney disease ([place])- (present on admission)   Received IV fluids.  Continue to monitor closely.  Latest creatinine of 1.4 improved from 1.8 on 03/26/2021.  Primary hypothyroidism- (present on  admission) Continue Synthroid.  Adrenal insufficiency ([place])- (present on admission) Continue Solu-Cortef.  Blood pressure is stable at this time.  Essential hypertension- (present on admission) On Cardizem at home.  Continue while in the hospital.  Latest blood pressure of 101/52  Coronary artery disease- (present on admission) No acute issues.  Continue aspirin.  Not on statins at this time.  GERD (gastroesophageal reflux disease)- (present on admission) Continue famotidine.  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  Communicated with the patient's daughter on the phone on 03/27/2021.  Patient follows up with Dr Alen Blew as outpatient.  He is scheduled to have an appointment first week of March.    DVT prophylaxis: enoxaparin (LOVENOX) injection 40 mg Start: 03/27/21 1000  Code Status:     Code Status: Full Code  Disposition: Home tomorrow.  Status is: Inpatient  Remains inpatient appropriate because: SIRS, generalized weakness,    Family Communication: Spoke with the patient's daughter on 03/28/2021.  She is the caretaker for him and she is also feeling very sick today.  Consultants:  None  Procedures:  None  Antimicrobials:  Vancomycin cefepime, metronidazole 03/25/21>03/28/21  Subjective:  Today, patient was seen and examined at bedside.  Patient feels tired weak and fatigued.  Does not feel quite well and at baseline.  No fever chills nausea vomiting.  Has mild cough but no dyspnea  Objective: Vitals:   03/27/21 2147 03/28/21 0148 03/28/21 0545 03/28/21 1037  BP: 120/68 (!) 102/42 110/74   Pulse: 71 66 70   Resp: 17 18 18    Temp: 98.8 F (  37.1 C) 98.7 F (37.1 C) 98.3 F (36.8 C)   TempSrc: Oral Oral Oral   SpO2: 95% 99%  94%  Weight:      Height:        Intake/Output Summary (Last 24 hours) at 03/28/2021 1235 Last data filed at 03/28/2021  1009 Gross per 24 hour  Intake 2564.07 ml  Output 1450 ml  Net 1114.07 ml   Filed Weights   03/25/21 2005  Weight: 90.7 kg    Physical Examination:  General: Obese built, not in obvious distress HENT:   No scleral pallor or icterus noted. Oral mucosa is moist.  Chest:   Diminished breath sounds bilaterally. No crackles or wheezes.  CVS: S1 &S2 heard. No murmur.  Regular rate and rhythm. Abdomen: Soft, nontender, nondistended.  Bowel sounds are heard.   Extremities: No cyanosis, clubbing or edema.  Peripheral pulses are palpable. Psych: Alert, awake and oriented, normal mood CNS:  No cranial nerve deficits.  Moderate generalized weakness Skin: Warm and dry.  No rashes noted.  Data Reviewed:   CBC: Recent Labs  Lab 03/25/21 2024 03/27/21 0458 03/28/21 0322  WBC 7.6 4.4 4.9  NEUTROABS 5.0 2.8  --   HGB 13.9 8.5* 9.7*  HCT 44.1 26.1* 29.6*  MCV 93.0 92.2 91.6  PLT 231 141* 146*    Basic Metabolic Panel: Recent Labs  Lab 03/25/21 2024 03/26/21 1114 03/27/21 0458 03/28/21 0322  NA 138 137 141 141  K 4.9 4.5 3.5 3.8  CL 105 107 115* 110  CO2 22 22 20* 26  GLUCOSE 100* 129* 78 106*  BUN 40* 36* 30* 27*  CREATININE 2.78* 1.81* 1.21 1.43*  CALCIUM 8.7* 7.8* 6.9* 8.1*  MG  --   --   --  2.0    Liver Function Tests: Recent Labs  Lab 03/25/21 2024 03/27/21 0458  AST 20 21  ALT 15 16  ALKPHOS 43 21*  BILITOT 0.7 0.2*  PROT 8.3* 5.2*  ALBUMIN 4.6 2.9*     Radiology Studies: No results found.    LOS: 2 days    Flora Lipps, MD [facility] 03/28/2021, 12:35 PM

## 2021-03-28 NOTE — Plan of Care (Signed)
°  Problem: Safety: °Goal: Ability to remain free from injury will improve °Outcome: Progressing °  °Problem: Activity: °Goal: Risk for activity intolerance will decrease °Outcome: Progressing °  °

## 2021-03-28 NOTE — Progress Notes (Signed)
Transition of Care [facility]) Screening Note  Patient Details  Name: [name] Date of Birth: 02/10/1946  Transition of Care [facility]) CM/SW Contact:    Sherie Don, LCSW Phone Number: 03/28/2021, 10:54 AM  Transition of Care [facility]) has reviewed patient and no TOC needs have been identified at this time. We will continue to monitor patient advancement through interdisciplinary progression rounds. If new patient transition needs arise, please place a TOC consult.

## 2021-03-28 NOTE — Evaluation (Signed)
Physical Therapy Evaluation Patient Details Name: [name] MRN: [id number] DOB: December 22, 1945 Today's Date: 03/28/2021  History of Present Illness  76 y.o. male with past medical history of osteoarthritis, stage IIIa chronic kidney disease status post nephrectomy on the right, history of lung cancer, coronary artery disease, GERD, hyperlipidemia, hypertension presented to the hospital with decreased activity, fever fatigue and overall decline in general condition with decreased appetite. Dx of sepsis due to undetermined organism, acute renal failure.  Clinical Impression  Pt ambulated 250' in the hallway holding the dynamap cart for mild steadying assistance. SaO2 94% on room air when walking. He is mobilizing well at a modified independent level. At baseline he ambulates without an AD. He reports his daughter can assist him if needed at home. From a PT standpoint, he is ready to DC home. No further PT indicated, will sign off.        Recommendations for follow up therapy are one component of a multi-disciplinary discharge planning process, led by the attending physician.  Recommendations may be updated based on patient status, additional functional criteria and insurance authorization.  Follow Up Recommendations No PT follow up    Assistance Recommended at Discharge None  Patient can return home with the following       Equipment Recommendations None recommended by PT  Recommendations for Other Services       Functional Status Assessment Patient has not had a recent decline in their functional status     Precautions / Restrictions Precautions Precautions: None Precaution Comments: denies falls in past 6 months Restrictions Weight Bearing Restrictions: No      Mobility  Bed Mobility Overal bed mobility: Independent                  Transfers Overall transfer level: Independent                      Ambulation/Gait Ambulation/Gait assistance: Modified  independent (Device/Increase time) Gait Distance (Feet): 250 Feet Assistive device: IV Pole Gait Pattern/deviations: WFL(Within Functional Limits) Gait velocity: WNL     General Gait Details: steady holding dynamap cart, SaO2 94% on room air while walking  Stairs            Wheelchair Mobility    Modified Rankin (Stroke Patients Only)       Balance Overall balance assessment: Modified Independent                                           Pertinent Vitals/Pain Pain Assessment Pain Assessment: No/denies pain    Home Living Family/patient expects to be discharged to:: Private residence Living Arrangements: Alone Available Help at Discharge: Family;Available PRN/intermittently Type of Home: House Home Access: Stairs to enter Entrance Stairs-Rails: Right Entrance Stairs-Number of Steps: 4   Home Layout: One level Home Equipment: Conservation officer, nature (2 wheels);Cane - single point      Prior Function Prior Level of Function : Independent/Modified Independent;Driving             Mobility Comments: walks without AD, denies falls in past 6 months, does yard work ADLs Comments: independent     Journalist, newspaper        Extremity/Trunk Assessment   Upper Extremity Assessment Upper Extremity Assessment: Overall WFL for tasks assessed    Lower Extremity Assessment Lower Extremity Assessment: Overall WFL for tasks assessed    Cervical /  Trunk Assessment Cervical / Trunk Assessment: Normal  Communication   Communication: HOH  Cognition Arousal/Alertness: Awake/alert Behavior During Therapy: WFL for tasks assessed/performed Overall Cognitive Status: Within Functional Limits for tasks assessed                                          General Comments      Exercises     Assessment/Plan    PT Assessment Patient does not need any further PT services  PT Problem List         PT Treatment Interventions      PT Goals  (Current goals can be found in the Care Plan section)  Acute Rehab PT Goals PT Goal Formulation: All assessment and education complete, DC therapy    Frequency       Co-evaluation               AM-PAC PT "6 Clicks" Mobility  Outcome Measure Help needed turning from your back to your side while in a flat bed without using bedrails?: None Help needed moving from lying on your back to sitting on the side of a flat bed without using bedrails?: None Help needed moving to and from a bed to a chair (including a wheelchair)?: None Help needed standing up from a chair using your arms (e.g., wheelchair or bedside chair)?: None Help needed to walk in hospital room?: None Help needed climbing 3-5 steps with a railing? : None 6 Click Score: 24    End of Session Equipment Utilized During Treatment: Gait belt Activity Tolerance: Patient tolerated treatment well Patient left: in bed;with call bell/phone within reach Nurse Communication: Mobility status      Time: 6160-7371 PT Time Calculation (min) (ACUTE ONLY): 10 min   Charges:   PT Evaluation $PT Eval Low Complexity: 1 Low          Philomena Doheny PT 03/28/2021  Acute Rehabilitation Services Pager [phone] Office [phone]

## 2021-03-29 DIAGNOSIS — E2740 Unspecified adrenocortical insufficiency: Secondary | ICD-10-CM | POA: Diagnosis not present

## 2021-03-29 DIAGNOSIS — R6510 Systemic inflammatory response syndrome (SIRS) of non-infectious origin without acute organ dysfunction: Secondary | ICD-10-CM

## 2021-03-29 DIAGNOSIS — C641 Malignant neoplasm of right kidney, except renal pelvis: Secondary | ICD-10-CM | POA: Diagnosis not present

## 2021-03-29 DIAGNOSIS — E785 Hyperlipidemia, unspecified: Secondary | ICD-10-CM | POA: Diagnosis not present

## 2021-03-29 DIAGNOSIS — N179 Acute kidney failure, unspecified: Secondary | ICD-10-CM | POA: Diagnosis not present

## 2021-03-29 DIAGNOSIS — E039 Hypothyroidism, unspecified: Secondary | ICD-10-CM | POA: Diagnosis not present

## 2021-03-29 DIAGNOSIS — R531 Weakness: Secondary | ICD-10-CM | POA: Diagnosis not present

## 2021-03-29 DIAGNOSIS — I2510 Atherosclerotic heart disease of native coronary artery without angina pectoris: Secondary | ICD-10-CM | POA: Diagnosis not present

## 2021-03-29 DIAGNOSIS — I700 Atherosclerosis of aorta: Secondary | ICD-10-CM | POA: Diagnosis not present

## 2021-03-29 DIAGNOSIS — I10 Essential (primary) hypertension: Secondary | ICD-10-CM | POA: Diagnosis not present

## 2021-03-29 MED ORDER — GUAIFENESIN-DM 100-10 MG/5ML PO SYRP: 5.0000 mL | ORAL_SOLUTION | ORAL | 0 refills | Status: DC | PRN

## 2021-03-29 NOTE — Discharge Summary (Addendum)
Physician Discharge Summary   Patient: [name] MRN: [id number] DOB: [birth year]  Admit date:     03/25/2021  Discharge date: 03/29/21  Discharge Physician: Corrie Mckusick [name]   PCP: [name], MD   Recommendations at discharge:   Follow-up with your primary care physician in 1 week.  Check blood work at that time. Follow-up with Dr Alen Blew oncology as has been scheduled.    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 *   Hospital Course: [name] is a 76 y.o. male with past medical history of osteoarthritis, stage IIIa chronic kidney disease status post nephrectomy on the right, history of lung cancer, coronary artery disease, GERD, hyperlipidemia, hypertension presented to the hospital with decreased activity, fever fatigue and overall decline in general condition with decreased appetite.  In the ED patient was noted to be febrile with a temperature of 100.3 F.  CBC within normal range.  COVID and influenza was negative.  Creatinine was elevated at 2.7.  Lactate was 2.0.  CT head was without acute findings.  CT renal stone protocol without acute findings but there was progression of malignancy in the right upper retroperitoneal area with enlarged lymphadenopathy.  In the ED, patient received LR bolus, hydrocortisone 100 mg IVP, cefepime, metronidazole and vancomycin and was admitted to hospital for further evaluation and treatment.   Assessment and Plan: * Generalized  weakness Likely secondary to SIRS.  Patient has been seen by physical therapy and recommended no physical therapy needs.  Patient feels much stronger today.  SIRS (systemic inflammatory response syndrome) ([place])- (present on admission) Initially received vancomycin, metronidazole and cefepime.  Blood cultures negative in.  Urine culture negative so far.  No fever or leukocytosis.  Chest x-ray with partial right lung resection no evidence of pulmonary disease.  Antibiotics were discontinued and patient was observed after that without any fever or leukocytosis.  Patient continued to clinically improve.   Acute renal failure superimposed on stage 3 chronic kidney disease ([place])- (present on admission)  Improved.  Received IV fluids during hospitalization.  Latest creatinine of 1.4 improved from 1.8 on 03/26/2021.  Patient was encouraged to increase oral fluid intake on discharge  Primary hypothyroidism- (present on admission) Continue Synthroid.  Adrenal insufficiency ([place])- (present on admission) Continue Solu-Cortef from home.  Essential hypertension- (present on admission) On Cardizem at home.  This will be resumed on discharge.  Coronary artery disease- (present on admission) No acute issues.  Continue aspirin.  Not on statins at this time.  GERD (gastroesophageal reflux disease)- (present on admission) Continue famotidine.  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  Communicated with the patient's daughter on the phone on 03/27/2021.  Patient follows up with Dr Alen Blew as outpatient.  He is scheduled to have an appointment first week of March.   Consultants: None Procedures performed: None Disposition: Home Diet recommendation:  Discharge Diet Orders (From admission, onward)  Start     Ordered   03/29/21 0000  Diet - low sodium heart healthy        03/29/21 0917            Cardiac diet  DISCHARGE MEDICATION: Allergies as of 03/29/2021       Reactions   Benadryl [diphenhydramine] Other (See Comments)   Severe restless legs        Medication List     STOP taking these medications    multivitamin with minerals Tabs tablet       TAKE these medications    acetaminophen 325 MG tablet Commonly known as: TYLENOL Take 2 tablets (650 mg total) by mouth every 6 (six) hours as needed for mild pain or headache (over the counter). What changed: reasons to take this   aspirin 81 MG chewable tablet Chew 81 mg by mouth daily.   Centrum Silver 50+Men Tabs Take 1 tablet by mouth daily with breakfast.   diltiazem 120 MG 24 hr capsule Commonly known as: CARDIZEM CD Take 120 mg by mouth in the morning.   famotidine 20 MG tablet Commonly known as: PEPCID Take 20 mg by mouth See admin instructions. Take 20 mg by mouth at 3 PM daily and an additional 20 mg once a day as needed for heartburn or indigestion   guaiFENesin-dextromethorphan 100-10 MG/5ML syrup Commonly known as: ROBITUSSIN DM Take 5 mLs by mouth every 4 (four) hours as needed for cough.   hydrocortisone 10 MG tablet Commonly known as: CORTEF Take 5-10 mg by mouth See admin instructions. Take 10 mg by mouth in the morning and 5 mg in the afternoon   hydrocortisone 5 MG tablet Commonly known as: CORTEF Take 2 tablets in am and 1 tablet in pm, ~ 2pm.   levothyroxine 150 MCG tablet Commonly known as: SYNTHROID TAKE 1 TABLET BY MOUTH EVERY OTHER DAY What changed:  when to take this additional instructions   levothyroxine 175 MCG tablet Commonly known as: SYNTHROID TAKE 1 TABLET BY MOUTH EVERY OTHER DAY What changed:  when to take this additional instructions   temazepam 7.5 MG capsule Commonly known as: RESTORIL Take 1 capsule (7.5 mg total) by mouth at bedtime as needed for sleep.   traZODone 150 MG tablet Commonly known as: DESYREL Take 150 mg by mouth at  bedtime as needed for sleep.   Vitamin D3 50 MCG (2000 UT) Tabs Take 2,000 Units by mouth daily.        Follow-up Information     [name], MD Follow up in 1 week(s).   Specialty: Family Medicine Why: regular followup, blood work Contact information: [place] [address] [phone]         Sanda Klein, MD .   Specialty: Cardiology Contact information: [address] [place] [address] [place] [address] [phone]                 Subjective Today, patient was seen and examined at bedside.  No fever chills nausea vomiting.  Feels stronger than yesterday.  Wishes to go home.  Discharge Exam: Filed Weights   03/25/21 2005  Weight: 90.7 kg   Today's Vitals   03/29/21 0527 03/29/21 0845 03/29/21 1100 03/29/21 1356  BP: 107/68 114/84  122/72  Pulse: 68 66  61  Resp: 17 18  16   Temp: 98.4 F (36.9 C)   98.1 F (36.7 C)  TempSrc: Oral   Oral  SpO2: 92% 90%  94%  Weight:      Height:      PainSc:   0-No pain    Body mass index is 29.53 kg/m.  General: Obese built, not in obvious distress HENT:   No scleral pallor or icterus noted. Oral mucosa is moist.  Chest:  Clear breath sounds.  Diminished breath sounds bilaterally. No crackles or wheezes.  CVS: S1 &S2 heard. No murmur.  Regular rate and rhythm. Abdomen: Soft, nontender, nondistended.  Bowel sounds are heard.   Extremities: No cyanosis, clubbing or edema.  Peripheral pulses are palpable. Psych: Alert, awake and oriented, normal mood CNS:  No cranial nerve deficits.  Power equal in all extremities.   Skin: Warm and dry.  No rashes noted.  Condition at discharge: good  The results of significant diagnostics from this hospitalization (including imaging, microbiology, ancillary and laboratory) are listed below for reference.   Imaging Studies: CT Head Wo Contrast  Result Date: 03/26/2021 CLINICAL DATA:  Mental status change of unknown cause. History of renal  cancer and lung cancer. EXAM: CT HEAD WITHOUT CONTRAST TECHNIQUE: Contiguous axial images were obtained from the base of the skull through the vertex without intravenous contrast. RADIATION DOSE REDUCTION: This exam was performed according to the departmental dose-optimization program which includes automated exposure control, adjustment of the mA and/or kV according to patient size and/or use of iterative reconstruction technique. COMPARISON:  None. FINDINGS: Brain: No evidence of acute infarction, hemorrhage, hydrocephalus, extra-axial collection or mass lesion/mass effect. Mild cerebral atrophy. Vascular: Moderate intracranial arterial vascular calcifications. Skull: Calvarium appears intact. Sinuses/Orbits: Paranasal sinuses and mastoid air cells are clear. Other: None. IMPRESSION: No acute intracranial abnormalities.  Mild cerebral atrophy. Electronically Signed   By: Lucienne Capers M.D.   On: 03/26/2021 01:17   DG Chest Port 1 View  Result Date: 03/25/2021 CLINICAL DATA:  Question of sepsis. EXAM: PORTABLE CHEST 1 VIEW COMPARISON:  CT 10/16/2020 FINDINGS: Postoperative changes on the right with associated volume loss in the right lung consistent with partial resection. No airspace disease or consolidation in the lungs. No pleural effusions. No pneumothorax. Mediastinal contours appear intact. Normal heart size. Old rib fractures. IMPRESSION: Postoperative partial right lung resection. No evidence of active pulmonary disease. Electronically Signed   By: Lucienne Capers M.D.   On: 03/25/2021 20:44   CT Renal Stone Study  Result Date: 03/26/2021 CLINICAL DATA:  76 year old male with history of flank pain. Microscopic hematuria. EXAM: CT ABDOMEN AND PELVIS WITHOUT CONTRAST TECHNIQUE: Multidetector CT imaging of the abdomen and pelvis was performed following the standard protocol without IV contrast. RADIATION DOSE REDUCTION: This exam was performed according to the departmental dose-optimization program  which includes automated exposure control, adjustment of the mA and/or kV according to patient size and/or use of iterative reconstruction technique. COMPARISON:  CT the abdomen and pelvis 10/16/2020. FINDINGS: Lower chest: Atherosclerotic calcifications in the descending thoracic aorta as well as the left anterior descending, left circumflex and right coronary arteries. Hepatobiliary: Status post cholecystectomy. No definite suspicious cystic or solid hepatic lesions are confidently identified on today's noncontrast CT examination. Pancreas: No definite pancreatic mass or peripancreatic fluid collections or inflammatory changes are noted on today's noncontrast CT examination. Spleen: Unremarkable. Adrenals/Urinary Tract: Status post right radical nephrectomy. Extensive fat necrosis in the nephrectomy bed is grossly similar to the prior study. Increasingly evident abnormal soft tissue in the upper left retroperitoneum, concerning for local recurrence of disease and/or nodal metastatic disease. Specific examples include a probable portacaval lymph node (axial image 29  of series 2) measuring 2.0 x 4.2 cm which is new compared to the prior examination, and a larger mass anterior to the inferior vena cava (axial image 31 of series 2 and coronal image 70 of series 4) which measures 4.1 x 2.9 x 3.8 cm, clearly enlarged compared to the prior study. No calcifications are noted within the collecting system of the left kidney, along the course of the left ureter or within the lumen of the urinary bladder. Unenhanced appearance of the left kidney, bilateral adrenal glands and urinary bladder is normal. Stomach/Bowel: Unenhanced appearance of the stomach is normal. There is no pathologic dilatation of small bowel or colon. Numerous colonic diverticulae are noted, particularly in the sigmoid colon, without surrounding inflammatory changes to suggest an acute diverticulitis at this time. The appendix is not confidently identified  and may be surgically absent. Regardless, there are no inflammatory changes noted adjacent to the cecum to suggest the presence of an acute appendicitis at this time. Vascular/Lymphatic: Aortic atherosclerosis. Soft tissue masses in the upper right retroperitoneum (detailed above), which may represent nodal metastatic disease. Multiple other prominent but nonenlarged retroperitoneal lymph nodes are noted, measuring up to 9 mm in short axis in the left para-aortic nodal station (axial image 40 of series 2). No definite pelvic lymphadenopathy. Reproductive: Prostate gland and seminal vesicles are unremarkable in appearance. Other: No significant volume of ascites.  No pneumoperitoneum. Musculoskeletal: There are no aggressive appearing lytic or blastic lesions noted in the visualized portions of the skeleton. IMPRESSION: 1. No acute findings in the abdomen or pelvis to account for the patient's symptoms. Specifically, no urinary tract calculi or findings of urinary tract obstruction. 2. However, there is evidence of progressive malignancy in the upper right retroperitoneum. This may reflect locally recurrent renal cell carcinoma and/or regional nodal metastasis, as detailed above. 3. Colonic diverticulosis without evidence of acute diverticulitis at this time. 4. Aortic atherosclerosis. Electronically Signed   By: Vinnie Langton M.D.   On: 03/26/2021 06:26    Microbiology: Results for orders placed or performed during the hospital encounter of 03/25/21  Resp Panel by RT-PCR (Flu A&B, Covid) Nasopharyngeal Swab     Status: None   Collection Time: 03/25/21  8:24 PM   Specimen: Nasopharyngeal Swab; Nasopharyngeal(NP) swabs in vial transport medium  Result Value Ref Range Status   SARS Coronavirus 2 by RT PCR NEGATIVE NEGATIVE Final    Comment: (NOTE) SARS-CoV-2 target nucleic acids are NOT DETECTED.  The SARS-CoV-2 RNA is generally detectable in upper respiratory specimens during the acute phase of  infection. The lowest concentration of SARS-CoV-2 viral copies this assay can detect is 138 copies/mL. A negative result does not preclude SARS-Cov-2 infection and should not be used as the sole basis for treatment or other patient management decisions. A negative result may occur with  improper specimen collection/handling, submission of specimen other than nasopharyngeal swab, presence of viral mutation(s) within the areas targeted by this assay, and inadequate number of viral copies(<138 copies/mL). A negative result must be combined with clinical observations, patient history, and epidemiological information. The expected result is Negative.  Fact Sheet for Patients:  EntrepreneurPulse.com.au  Fact Sheet for Healthcare Providers:  IncredibleEmployment.be  This test is no t yet approved or cleared by the [place] FDA and  has been authorized for detection and/or diagnosis of SARS-CoV-2 by FDA under an Emergency Use Authorization (EUA). This EUA will remain  in effect (meaning this test can be used) for the duration of the COVID-19  declaration under Section 564(b)(1) of the Act, 21 U.S.C.section 360bbb-3(b)(1), unless the authorization is terminated  or revoked sooner.       Influenza A by PCR NEGATIVE NEGATIVE Final   Influenza B by PCR NEGATIVE NEGATIVE Final    Comment: (NOTE) The Xpert Xpress SARS-CoV-2/FLU/RSV plus assay is intended as an aid in the diagnosis of influenza from Nasopharyngeal swab specimens and should not be used as a sole basis for treatment. Nasal washings and aspirates are unacceptable for Xpert Xpress SARS-CoV-2/FLU/RSV testing.  Fact Sheet for Patients: EntrepreneurPulse.com.au  Fact Sheet for Healthcare Providers: IncredibleEmployment.be  This test is not yet approved or cleared by the [place] FDA and has been authorized for detection and/or diagnosis of SARS-CoV-2  by FDA under an Emergency Use Authorization (EUA). This EUA will remain in effect (meaning this test can be used) for the duration of the COVID-19 declaration under Section 564(b)(1) of the Act, 21 U.S.C. section 360bbb-3(b)(1), unless the authorization is terminated or revoked.  Performed at [facility], [place] [address] [place], [place] [address]   Blood Culture (routine x 2)     Status: None (Preliminary result)   Collection Time: 03/25/21  8:27 PM   Specimen: BLOOD  Result Value Ref Range Status   Specimen Description   Final    BLOOD LEFT ANTECUBITAL Performed at [facility] [address], [place], [place] [address]    Special Requests   Final    BOTTLES DRAWN AEROBIC AND ANAEROBIC Blood Culture adequate volume Performed at [facility] [address], [place], [place] [address]    Culture   Final    NO GROWTH 3 DAYS Performed at [facility], [place] [address] [place], [place] [address]    Report Status PENDING  Incomplete  Blood Culture (routine x 2)     Status: None (Preliminary result)   Collection Time: 03/25/21  8:29 PM   Specimen: BLOOD  Result Value Ref Range Status   Specimen Description   Final    BLOOD RIGHT ANTECUBITAL Performed at [facility] [address] [place], [place] [address]    Special Requests   Final    BOTTLES DRAWN AEROBIC AND ANAEROBIC Blood Culture results may not be optimal due to an inadequate volume of blood received in culture bottles Performed at [facility] [address] [place], [place] [address]    Culture   Final    NO GROWTH 3 DAYS Performed at [facility], [place] [address], [place], [place] [address]    Report Status PENDING  Incomplete  Urine Culture     Status: None   Collection Time: 03/26/21 12:30 AM   Specimen: In/Out Cath Urine  Result Value Ref Range Status   Specimen Description   Final    IN/OUT CATH  URINE Performed at [facility] [address] [place], [place] [address]    Special Requests   Final    NONE Performed at [facility], [place] [address] [place], [place] [address]    Culture   Final    NO GROWTH Performed at [facility], [place] [address], [place], [place] [address]    Report Status 03/27/2021 FINAL  Final    Labs: CBC: Recent Labs  Lab 03/25/21 2024 03/27/21 0458 03/28/21 0322  WBC 7.6 4.4 4.9  NEUTROABS 5.0 2.8  --   HGB 13.9 8.5* 9.7*  HCT 44.1  26.1* 29.6*  MCV 93.0 92.2 91.6  PLT 231 141* 527*   Basic Metabolic Panel: Recent Labs  Lab 03/25/21 2024 03/26/21 1114 03/27/21 0458 03/28/21 0322  NA 138 137 141 141  K 4.9 4.5 3.5 3.8  CL 105 107 115* 110  CO2 22 22 20* 26  GLUCOSE 100* 129* 78 106*  BUN 40* 36* 30* 27*  CREATININE 2.78* 1.81* 1.21 1.43*  CALCIUM 8.7* 7.8* 6.9* 8.1*  MG  --   --   --  2.0   Liver Function Tests: Recent Labs  Lab 03/25/21 2024 03/27/21 0458  AST 20 21  ALT 15 16  ALKPHOS 43 21*  BILITOT 0.7 0.2*  PROT 8.3* 5.2*  ALBUMIN 4.6 2.9*   CBG: No results for input(s): GLUCAP in the last 168 hours.  Discharge time spent: greater than 30 minutes.  Signed: Flora Lipps, MD [facility] 03/29/2021

## 2021-03-31 LAB — CULTURE, BLOOD (ROUTINE X 2)
Culture: NO GROWTH
Culture: NO GROWTH
Special Requests: ADEQUATE

## 2021-04-15 DIAGNOSIS — J209 Acute bronchitis, unspecified: Secondary | ICD-10-CM | POA: Diagnosis not present

## 2021-04-21 DIAGNOSIS — C441121 Basal cell carcinoma of skin of right upper eyelid, including canthus: Secondary | ICD-10-CM | POA: Diagnosis not present

## 2021-04-21 DIAGNOSIS — L570 Actinic keratosis: Secondary | ICD-10-CM | POA: Diagnosis not present

## 2021-04-21 DIAGNOSIS — L578 Other skin changes due to chronic exposure to nonionizing radiation: Secondary | ICD-10-CM | POA: Diagnosis not present

## 2021-04-22 ENCOUNTER — Encounter (HOSPITAL_COMMUNITY): Payer: Self-pay

## 2021-04-22 ENCOUNTER — Other Ambulatory Visit: Payer: Self-pay

## 2021-04-22 ENCOUNTER — Ambulatory Visit (HOSPITAL_COMMUNITY): Payer: Medicare HMO

## 2021-04-22 ENCOUNTER — Inpatient Hospital Stay: Payer: Medicare HMO | Attending: Oncology

## 2021-04-22 ENCOUNTER — Ambulatory Visit (HOSPITAL_COMMUNITY)
Admission: RE | Admit: 2021-04-22 | Discharge: 2021-04-22 | Disposition: A | Discharge status: Home / Self Care | Payer: Medicare HMO | Source: Ambulatory Visit | Attending: Oncology | Admitting: Oncology

## 2021-04-22 DIAGNOSIS — C787 Secondary malignant neoplasm of liver and intrahepatic bile duct: Secondary | ICD-10-CM | POA: Insufficient documentation

## 2021-04-22 DIAGNOSIS — C649 Malignant neoplasm of unspecified kidney, except renal pelvis: Secondary | ICD-10-CM | POA: Insufficient documentation

## 2021-04-22 DIAGNOSIS — C641 Malignant neoplasm of right kidney, except renal pelvis: Secondary | ICD-10-CM | POA: Diagnosis not present

## 2021-04-22 DIAGNOSIS — C786 Secondary malignant neoplasm of retroperitoneum and peritoneum: Secondary | ICD-10-CM | POA: Insufficient documentation

## 2021-04-22 DIAGNOSIS — N19 Unspecified kidney failure: Secondary | ICD-10-CM | POA: Insufficient documentation

## 2021-04-22 DIAGNOSIS — R160 Hepatomegaly, not elsewhere classified: Secondary | ICD-10-CM | POA: Diagnosis not present

## 2021-04-22 DIAGNOSIS — I700 Atherosclerosis of aorta: Secondary | ICD-10-CM | POA: Diagnosis not present

## 2021-04-22 DIAGNOSIS — K7689 Other specified diseases of liver: Secondary | ICD-10-CM | POA: Diagnosis not present

## 2021-04-22 LAB — CBC WITH DIFFERENTIAL (CANCER CENTER ONLY)
Abs Immature Granulocytes: 0.02 10*3/uL (ref 0.00–0.07)
Basophils Absolute: 0 10*3/uL (ref 0.0–0.1)
Basophils Relative: 0 %
Eosinophils Absolute: 0.3 10*3/uL (ref 0.0–0.5)
Eosinophils Relative: 5 %
HCT: 35.7 % — ABNORMAL LOW (ref 39.0–52.0)
Hemoglobin: 11.7 g/dL — ABNORMAL LOW (ref 13.0–17.0)
Immature Granulocytes: 0 %
Lymphocytes Relative: 32 %
Lymphs Abs: 1.8 10*3/uL (ref 0.7–4.0)
MCH: 28.9 pg (ref 26.0–34.0)
MCHC: 32.8 g/dL (ref 30.0–36.0)
MCV: 88.1 fL (ref 80.0–100.0)
Monocytes Absolute: 0.8 10*3/uL (ref 0.1–1.0)
Monocytes Relative: 14 %
Neutro Abs: 2.7 10*3/uL (ref 1.7–7.7)
Neutrophils Relative %: 49 %
Platelet Count: 199 10*3/uL (ref 150–400)
RBC: 4.05 MIL/uL — ABNORMAL LOW (ref 4.22–5.81)
RDW: 13 % (ref 11.5–15.5)
WBC Count: 5.5 10*3/uL (ref 4.0–10.5)
nRBC: 0 % (ref 0.0–0.2)

## 2021-04-22 LAB — CMP (CANCER CENTER ONLY)
ALT: 11 U/L (ref 0–44)
AST: 14 U/L — ABNORMAL LOW (ref 15–41)
Albumin: 4.4 g/dL (ref 3.5–5.0)
Alkaline Phosphatase: 43 U/L (ref 38–126)
Anion gap: 5 (ref 5–15)
BUN: 17 mg/dL (ref 8–23)
CO2: 30 mmol/L (ref 22–32)
Calcium: 9.3 mg/dL (ref 8.9–10.3)
Chloride: 105 mmol/L (ref 98–111)
Creatinine: 1.28 mg/dL — ABNORMAL HIGH (ref 0.61–1.24)
GFR, Estimated: 57 mL/min — ABNORMAL LOW (ref >60)
Glucose, Bld: 89 mg/dL (ref 70–99)
Potassium: 3.9 mmol/L (ref 3.5–5.1)
Sodium: 140 mmol/L (ref 135–145)
Total Bilirubin: 0.4 mg/dL (ref 0.3–1.2)
Total Protein: 7.5 g/dL (ref 6.5–8.1)

## 2021-04-25 ENCOUNTER — Telehealth: Payer: Self-pay | Admitting: Oncology

## 2021-04-25 NOTE — Telephone Encounter (Signed)
Called patient regarding upcoming appointments, patient is notified. 

## 2021-04-29 ENCOUNTER — Inpatient Hospital Stay: Payer: Medicare HMO | Admitting: Oncology

## 2021-04-29 ENCOUNTER — Other Ambulatory Visit (HOSPITAL_COMMUNITY): Payer: Self-pay

## 2021-04-29 ENCOUNTER — Encounter: Payer: Self-pay | Admitting: Oncology

## 2021-04-29 ENCOUNTER — Telehealth: Payer: Self-pay

## 2021-04-29 ENCOUNTER — Other Ambulatory Visit: Payer: Self-pay

## 2021-04-29 VITALS — BP 117/65 | HR 74 | Temp 98.0°F | Resp 18 | Ht 69.0 in | Wt 184.2 lb

## 2021-04-29 DIAGNOSIS — C787 Secondary malignant neoplasm of liver and intrahepatic bile duct: Secondary | ICD-10-CM | POA: Diagnosis not present

## 2021-04-29 DIAGNOSIS — C641 Malignant neoplasm of right kidney, except renal pelvis: Secondary | ICD-10-CM | POA: Diagnosis not present

## 2021-04-29 DIAGNOSIS — N19 Unspecified kidney failure: Secondary | ICD-10-CM | POA: Diagnosis not present

## 2021-04-29 DIAGNOSIS — C786 Secondary malignant neoplasm of retroperitoneum and peritoneum: Secondary | ICD-10-CM | POA: Diagnosis not present

## 2021-04-29 DIAGNOSIS — C649 Malignant neoplasm of unspecified kidney, except renal pelvis: Secondary | ICD-10-CM | POA: Diagnosis not present

## 2021-04-29 MED ORDER — CABOMETYX 40 MG PO TABS
40.0000 mg | ORAL_TABLET | Freq: Every day | ORAL | 1 refills | Status: DC
  Filled 2021-04-29 – 2021-04-30 (×2): qty 30, 30d supply, fill #0
  Filled 2021-05-22: qty 30, 30d supply, fill #1

## 2021-04-29 NOTE — Telephone Encounter (Signed)
Oral Oncology Patient Advocate Encounter ?  ?Received notification from [facility] that prior authorization for Cabometyx is required. ?  ?PA submitted on CoverMyMeds ?Key [id number] ?Status is pending ?  ?Oral Oncology Clinic will continue to follow. ? ?Wynn Maudlin CPHT ?Specialty Pharmacy Patient Advocate ?[place] ?Phone [phone] ?Fax [phone] ?04/29/2021 11:30 AM ? ? ?

## 2021-04-29 NOTE — Telephone Encounter (Signed)
Oral Oncology Pharmacist Encounter ? ?Received new prescription for cabozantinib (Cabometyx) for the treatment of clear cell renal cell carcinoma, planned duration until disease progression or unacceptable toxicity. Prescription dose and frequency assessed. ? ?Labs from 04/22/2021 assessed, no dose adjustments needed. ? ?Current medication list in Epic reviewed, DDIs with Cabometyx identified:  ?Diltiazem: will monitor for increased cabometyx toxicities  ? ?Evaluated chart and no patient barriers to medication adherence noted.  ? ?Patient agreement for treatment documented in MD note on 04/29/2021. ? ?Prescription has been e-scribed to the [facility] for benefits analysis and approval. ? ?Oral Oncology Clinic will continue to follow for insurance authorization, copayment issues, initial counseling and start date. ? ?Drema Halon, PharmD ?Hematology/Oncology Clinical Pharmacist ?[facility] ?[phone] ?04/29/2021 10:05 AM ? ? ?

## 2021-04-29 NOTE — Telephone Encounter (Signed)
Oral Oncology Patient Advocate Encounter ? ?Prior Authorization for Cabometyx has been approved.   ? ?PA# [id number] ?Effective dates: 04/29/21 through 02/15/22 ? ?Patients co-pay is $10.35 ? ?Oral Oncology Clinic will continue to follow.  ? ?Wynn Maudlin CPHT ?Specialty Pharmacy Patient Advocate ?[place] ?Phone [phone] ?Fax [phone] ?04/29/2021 11:49 AM ? ?

## 2021-04-29 NOTE — Progress Notes (Signed)
Hematology and Oncology Follow Up Visit ? ?[name] ?[id number] ?[birth year] 76 y.o. ?04/29/2021 9:06 AM ?[name], [place], [name], *  ? ?Principle Diagnosis: 76 year old man with stage IV clear-cell renal cell carcinoma with hepatic and omental involvement diagnosed in 2019.   ? ?Prior Therapy: ?He underwent a radical nephrectomy completed by Dr. Louis Meckel on February 01, 2015.  The final pathology revealed a 3.3 cm clear cell renal cell carcinoma with Fuhrman grade 3.  Margins were negative indicating stage T1 a disease ? ?He is status post omental biopsy in July 2019 which confirmed the presence of recurrent metastatic renal cell carcinoma. ? ?Nivolumab at 3 mg/mg and ipilimumab at 1 mg/kg cycle 1 started on 09/21/2017.  He completed 4 cycles of therapy.   He did not receive any additional immunotherapy after developing adrenal insufficiency and panhypopituitarism. ? ?He is status post radiation therapy to abdominal lymph node completed on November 08, 2019.  He received total of 30 Gy in 10 fractions ? ?Current therapy: Under consideration to start treatment. ? ?Interim History: [name] is here for a follow-up visit.  Since the last visit, he was hospitalized in February 2023 for a worsening renal failure as well as a sepsis syndrome which has resolved at this time.  Since that time, he reports feeling well without any major complaints.  He denies any abdominal pain or discomfort.  Denies any weight loss or appetite changes.  He does report some sinus congestion which is improving. ? ? ? ? ? ? ? ? ? ? ? ?. ? ? ? ? ? ?Medications: Reviewed without changes. ?Current Outpatient Medications  ?Medication Sig Dispense Refill  ? acetaminophen (TYLENOL) 325 MG tablet Take 2 tablets (650 mg total) by mouth every 6 (six) hours as needed for mild pain or headache (over the counter). (Patient taking differently: Take 650 mg by mouth every 6 (six) hours as needed for mild pain or headache.) 30 tablet 3  ?  aspirin 81 MG chewable tablet Chew 81 mg by mouth daily.    ? Cholecalciferol (VITAMIN D3) 50 MCG (2000 UT) TABS Take 2,000 Units by mouth daily.    ? diltiazem (CARDIZEM CD) 120 MG 24 hr capsule Take 120 mg by mouth in the morning.    ? famotidine (PEPCID) 20 MG tablet Take 20 mg by mouth See admin instructions. Take 20 mg by mouth at 3 PM daily and an additional 20 mg once a day as needed for heartburn or indigestion    ? guaiFENesin-dextromethorphan (ROBITUSSIN DM) 100-10 MG/5ML syrup Take 5 mLs by mouth every 4 (four) hours as needed for cough. 118 mL 0  ? hydrocortisone (CORTEF) 10 MG tablet Take 5-10 mg by mouth See admin instructions. Take 10 mg by mouth in the morning and 5 mg in the afternoon    ? hydrocortisone (CORTEF) 5 MG tablet Take 2 tablets in am and 1 tablet in pm, ~ 2pm. (Patient not taking: Reported on 03/25/2021) 300 tablet 3  ? levothyroxine (SYNTHROID) 150 MCG tablet TAKE 1 TABLET BY MOUTH EVERY OTHER DAY (Patient taking differently: Take 150 mcg by mouth See admin instructions. Take 150 mcg by mouth every other morning before breakfast) 30 tablet 3  ? levothyroxine (SYNTHROID) 175 MCG tablet TAKE 1 TABLET BY MOUTH EVERY OTHER DAY (Patient taking differently: Take 175 mcg by mouth See admin instructions. Take 175 mcg by mouth every other morning before breakfast) 30 tablet 3  ? Multiple Vitamins-Minerals (CENTRUM SILVER 50+MEN) TABS Take  1 tablet by mouth daily with breakfast.    ? temazepam (RESTORIL) 7.5 MG capsule Take 1 capsule (7.5 mg total) by mouth at bedtime as needed for sleep. (Patient not taking: Reported on 03/25/2021) 30 capsule 0  ? traZODone (DESYREL) 150 MG tablet Take 150 mg by mouth at bedtime as needed for sleep.    ? ?No current facility-administered medications for this visit.  ? ? ? ?Allergies:  ?Allergies  ?Allergen Reactions  ? Benadryl [Diphenhydramine] Other (See Comments)  ?  Severe restless legs  ? ? ? ? ? ? ?Physical Exam: ? ? ? ?Blood pressure 117/65, pulse 74,  temperature 98 ?F (36.7 ?C), temperature source Temporal, resp. rate 18, height 5\' 9"  (1.753 m), weight 184 lb 3.2 oz (83.6 kg), SpO2 98 %. ? ? ? ? ? ? ? ? ? ?ECOG: 1 ? ? ? ?General appearance: Alert, awake without any distress. ?Head: Atraumatic without abnormalities ?Oropharynx: Without any thrush or ulcers. ?Eyes: No scleral icterus. ?Lymph nodes: No lymphadenopathy noted in the cervical, supraclavicular, or axillary nodes ?Heart:regular rate and rhythm, without any murmurs or gallops.   ?Lung: Clear to auscultation without any rhonchi, wheezes or dullness to percussion. ?Abdomin: Soft, nontender without any shifting dullness or ascites. ?Musculoskeletal: No clubbing or cyanosis. ?Neurological: No motor or sensory deficits. ?Skin: No rashes or lesions. ?  ? ? ? ? ? ? ? ? ? ? ? ? ?Lab Results: ?Lab Results  ?Component Value Date  ? WBC 5.5 04/22/2021  ? HGB 11.7 (L) 04/22/2021  ? HCT 35.7 (L) 04/22/2021  ? MCV 88.1 04/22/2021  ? PLT 199 04/22/2021  ? ?  Chemistry   ?   ?Component Value Date/Time  ? NA 140 04/22/2021 0909  ? NA 134 03/11/2018 0913  ? K 3.9 04/22/2021 0909  ? CL 105 04/22/2021 0909  ? CO2 30 04/22/2021 0909  ? BUN 17 04/22/2021 0909  ? BUN 17 03/11/2018 0913  ? CREATININE 1.28 (H) 04/22/2021 0909  ?    ?Component Value Date/Time  ? CALCIUM 9.3 04/22/2021 0909  ? ALKPHOS 43 04/22/2021 0909  ? AST 14 (L) 04/22/2021 0909  ? ALT 11 04/22/2021 0909  ? BILITOT 0.4 04/22/2021 0909  ?  ? ? ?IMPRESSION: ?1. Interval growth of heterogeneous solid 4.2 cm mass centered in ?the uncinate process of the pancreas, compatible with enlarging ?pancreatic metastasis. ?2. Stable indeterminate segment 4A left liver mass. ?3. Stable postsurgical changes from right nephrectomy with extensive ?right retroperitoneal fat necrosis. No evidence of local tumor ?recurrence in the right nephrectomy bed. ?4. No evidence of recurrent metastatic disease in the chest. ?5. Aortic Atherosclerosis (ICD10-I70.0). ?  ? ? ?Impression and  Plan: ? ?76 year old man with: ?  ?1.  Kidney cancer diagnosed in 2016.  He developed stage IV clear-cell renal cell carcinoma with hepatic and peritoneal metastasis in 2019. ? ? ?The natural course of this disease at this time was reviewed and treatment options were discussed.  CT scan obtained on April 22, 2021 showed more concerning findings of metastatic disease including hepatic and pancreatic involvement.  Treatment choices were discussed at this time.  Oral targeted therapy remains his best option after progression on immunotherapy.  He did suffer a lot of autoimmune complications as well.   ? ?Complication associated with cabozantinib 40 mg were discussed.  These include nausea, diarrhea, weight loss, anorexia on others were reviewed.  After discussion he is agreeable to proceed.  I prefer not to use radiation option due  to possible risk of pancreatitis and other GI issues.  This could be considered in the future. ? ? ? ?2.  Immune mediated complications: No residual autoimmune complications noted at this time.  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 ? ?3.  Renal failure: He is kidney function remained to baseline based on laboratory testing on April 22, 2021. ? ?4.  Prognosis and goals of care: Any treatment is palliative at this time although aggressive measures are warranted given his reasonable performance status. ? ?5.  Follow-up: In 6 weeks for repeat follow-up. ? ?30  minutes were spent on this visit.  The time was dedicated to reviewing laboratory data, reviewing imaging studies, disease status update and outlining future plan of care ? ? ?Zola Button, MD ?3/14/20239:06 AM  ? ? ? ?

## 2021-04-30 ENCOUNTER — Other Ambulatory Visit (HOSPITAL_COMMUNITY): Payer: Self-pay

## 2021-04-30 NOTE — Telephone Encounter (Signed)
Oral Chemotherapy Pharmacist Encounter ? ?Patient will receive on Friday, 3/17, to start Saturday, 3/18. ? ?I spoke with patient's daughter, [name], for overview of: Cabometyx for the treatment of clear cell renal cell carcinoma, planned duration until disease progression or unacceptable toxicity.  ? ?Counseled on administration, dosing, side effects, monitoring, drug-food interactions, safe handling, storage, and disposal. ? ?Patient will take Cabometyx 40 mg tablets, 1 tablet (40 mg) by mouth once daily on an empty stomach, 1 hour before or 2 hours after a meal. ? ?Patient knows to avoid grapefruit and grapefruit juice. ? ?Cabometyx start date: 05/03/21 ? ?Adverse effects include but are not limited to: diarrhea, nausea, decreased appetite, fatigue, hypertension, hand-foot syndrome, decreased blood counts, and electrolyte abnormalities. ?Patient will obtain anti diarrheal and alert the office of 4 or more loose stools above baseline. ? ?Patient's daughter informed that Cabometyx should be held at least 3 weeks prior to any scheduled surgery (including dental surgery) and not resumed until at least 2 weeks after major surgery and until adequate wound healing is established. ? ?Reviewed with patient's daughter importance of keeping a medication schedule and plan for any missed doses. No barriers to medication adherence identified. ? ?Insurance authorization for Cabometyx has been obtained. ?Test claim at the pharmacy revealed copayment $10.35 for 1st fill of cabozantinib. ?Patient's daughter will pick the medication up from the [facility] on 3/17. ? ?Patient's daughter informed the pharmacy will reach out 5-7 days prior to needing next fill of Cabometyx to coordinate continued medication acquisition to prevent break in therapy. ? ?All questions answered. ? ?[name] voiced understanding and appreciation.  ? ?Medication education handout placed in mail for patient. Patient knows to call the  office with questions or concerns. Oral Chemotherapy Clinic phone number provided to patient.  ? ?Benn Moulder, PharmD ?Pharmacy Resident  ?04/30/2021 ?3:45 PM ? ? ?

## 2021-05-15 ENCOUNTER — Other Ambulatory Visit (HOSPITAL_COMMUNITY): Payer: Self-pay

## 2021-05-22 ENCOUNTER — Other Ambulatory Visit (HOSPITAL_COMMUNITY): Payer: Self-pay

## 2021-05-26 ENCOUNTER — Other Ambulatory Visit (HOSPITAL_COMMUNITY): Payer: Self-pay

## 2021-05-29 DIAGNOSIS — E038 Other specified hypothyroidism: Secondary | ICD-10-CM | POA: Diagnosis not present

## 2021-05-29 DIAGNOSIS — R000 Tachycardia, unspecified: Secondary | ICD-10-CM | POA: Diagnosis not present

## 2021-05-30 ENCOUNTER — Telehealth: Payer: Self-pay

## 2021-05-30 DIAGNOSIS — R000 Tachycardia, unspecified: Secondary | ICD-10-CM | POA: Diagnosis not present

## 2021-05-30 NOTE — Telephone Encounter (Signed)
Daughter called to ask if Cabozantinib could cause heart racing. Patient has had "heart racing for two days." Cardiologist has placed a heart monitor on patient to evaluate. Daughter wanted to make Dr. Alen Blew aware. ?

## 2021-06-03 ENCOUNTER — Telehealth: Payer: Self-pay

## 2021-06-03 ENCOUNTER — Inpatient Hospital Stay (HOSPITAL_BASED_OUTPATIENT_CLINIC_OR_DEPARTMENT_OTHER): Payer: Medicare HMO | Admitting: Physician Assistant

## 2021-06-03 ENCOUNTER — Other Ambulatory Visit: Payer: Self-pay

## 2021-06-03 ENCOUNTER — Telehealth: Payer: Self-pay | Admitting: *Deleted

## 2021-06-03 ENCOUNTER — Inpatient Hospital Stay: Payer: Medicare HMO | Attending: Oncology | Admitting: Physician Assistant

## 2021-06-03 VITALS — BP 157/74 | HR 67 | Temp 98.7°F | Resp 18 | Wt 184.4 lb

## 2021-06-03 VITALS — BP 144/81 | HR 90 | Temp 98.7°F | Resp 16

## 2021-06-03 DIAGNOSIS — C787 Secondary malignant neoplasm of liver and intrahepatic bile duct: Secondary | ICD-10-CM | POA: Insufficient documentation

## 2021-06-03 DIAGNOSIS — Z79899 Other long term (current) drug therapy: Secondary | ICD-10-CM | POA: Insufficient documentation

## 2021-06-03 DIAGNOSIS — R634 Abnormal weight loss: Secondary | ICD-10-CM | POA: Diagnosis not present

## 2021-06-03 DIAGNOSIS — R638 Other symptoms and signs concerning food and fluid intake: Secondary | ICD-10-CM

## 2021-06-03 DIAGNOSIS — R109 Unspecified abdominal pain: Secondary | ICD-10-CM

## 2021-06-03 DIAGNOSIS — N19 Unspecified kidney failure: Secondary | ICD-10-CM | POA: Diagnosis not present

## 2021-06-03 DIAGNOSIS — C641 Malignant neoplasm of right kidney, except renal pelvis: Secondary | ICD-10-CM | POA: Diagnosis not present

## 2021-06-03 DIAGNOSIS — C649 Malignant neoplasm of unspecified kidney, except renal pelvis: Secondary | ICD-10-CM | POA: Diagnosis not present

## 2021-06-03 DIAGNOSIS — C786 Secondary malignant neoplasm of retroperitoneum and peritoneum: Secondary | ICD-10-CM | POA: Diagnosis not present

## 2021-06-03 DIAGNOSIS — R630 Anorexia: Secondary | ICD-10-CM | POA: Insufficient documentation

## 2021-06-03 DIAGNOSIS — R112 Nausea with vomiting, unspecified: Secondary | ICD-10-CM

## 2021-06-03 LAB — CBC WITH DIFFERENTIAL (CANCER CENTER ONLY)
Abs Immature Granulocytes: 0.02 10*3/uL (ref 0.00–0.07)
Basophils Absolute: 0 10*3/uL (ref 0.0–0.1)
Basophils Relative: 0 %
Eosinophils Absolute: 0.2 10*3/uL (ref 0.0–0.5)
Eosinophils Relative: 3 %
HCT: 38 % — ABNORMAL LOW (ref 39.0–52.0)
Hemoglobin: 13.8 g/dL (ref 13.0–17.0)
Immature Granulocytes: 0 %
Lymphocytes Relative: 24 %
Lymphs Abs: 1.7 10*3/uL (ref 0.7–4.0)
MCH: 29.6 pg (ref 26.0–34.0)
MCHC: 36.3 g/dL — ABNORMAL HIGH (ref 30.0–36.0)
MCV: 81.4 fL (ref 80.0–100.0)
Monocytes Absolute: 0.6 10*3/uL (ref 0.1–1.0)
Monocytes Relative: 9 %
Neutro Abs: 4.3 10*3/uL (ref 1.7–7.7)
Neutrophils Relative %: 64 %
Platelet Count: 142 10*3/uL — ABNORMAL LOW (ref 150–400)
RBC: 4.67 MIL/uL (ref 4.22–5.81)
RDW: 14.2 % (ref 11.5–15.5)
WBC Count: 6.8 10*3/uL (ref 4.0–10.5)
nRBC: 0 % (ref 0.0–0.2)

## 2021-06-03 LAB — CMP (CANCER CENTER ONLY)
ALT: 49 U/L — ABNORMAL HIGH (ref 0–44)
AST: 34 U/L (ref 15–41)
Albumin: 3.7 g/dL (ref 3.5–5.0)
Alkaline Phosphatase: 67 U/L (ref 38–126)
Anion gap: 7 (ref 5–15)
BUN: 23 mg/dL (ref 8–23)
CO2: 30 mmol/L (ref 22–32)
Calcium: 8.9 mg/dL (ref 8.9–10.3)
Chloride: 99 mmol/L (ref 98–111)
Creatinine: 1.25 mg/dL — ABNORMAL HIGH (ref 0.61–1.24)
GFR, Estimated: 59 mL/min — ABNORMAL LOW (ref >60)
Glucose, Bld: 110 mg/dL — ABNORMAL HIGH (ref 70–99)
Potassium: 3.9 mmol/L (ref 3.5–5.1)
Sodium: 136 mmol/L (ref 135–145)
Total Bilirubin: 0.5 mg/dL (ref 0.3–1.2)
Total Protein: 6.7 g/dL (ref 6.5–8.1)

## 2021-06-03 MED ORDER — ONDANSETRON HCL 4 MG/2ML IJ SOLN
4.0000 mg | Freq: Once | INTRAMUSCULAR | Status: AC
  Administered 2021-06-03: 4 mg via INTRAVENOUS
  Filled 2021-06-03: qty 2

## 2021-06-03 MED ORDER — SODIUM CHLORIDE 0.9 % IV SOLN: Freq: Once | INTRAVENOUS | Status: AC

## 2021-06-03 NOTE — Progress Notes (Signed)
? ? ? ?Symptom Management Consult note ?[name]   ? ?Patient Care Team: ?Alex Downing, MD as PCP - General (Family Medicine) ?[name], Alex Gobble, MD as PCP - Cardiology (Cardiology) ?Alex Boston, MD as Consulting Physician (General Surgery) ?Alex Hughs, MD as Consulting Physician (Urology)  ? ? ?Name of the patient: [name]  [id number]  [birth year]  ? ?Date of visit: 06/03/2021  ? ? ?Chief complaint/ Reason for visit- decreased appetite, nausea ? ?Oncology History  ?Kidney cancer, primary, with metastasis from kidney to other site [facility])  ?09/14/2017 Initial Diagnosis  ? Kidney cancer, primary, with metastasis from kidney to other site [facility]) ? ?  ?09/21/2017 - 12/30/2017 Chemotherapy  ? The patient had ipilimumab (YERVOY) 100 mg in sodium chloride 0.9 % 50 mL chemo infusion, 1.06 mg/kg = 95 mg, Intravenous,  Once, 4 of 4 cycles ?Administration: 100 mg (09/21/2017), 100 mg (10/13/2017), 100 mg (11/03/2017), 100 mg (11/24/2017) ?nivolumab (OPDIVO) 280 mg in sodium chloride 0.9 % 100 mL chemo infusion, 2.93 mg/kg = 287 mg, Intravenous, Once, 6 of 9 cycles ?Administration: 280 mg (09/21/2017), 240 mg (12/15/2017), 280 mg (10/13/2017), 280 mg (11/03/2017), 280 mg (11/24/2017), 240 mg (12/30/2017) ? ? for chemotherapy treatment.  ? ?  ?03/24/2018 - 03/24/2018 Chemotherapy  ? The patient had nivolumab (OPDIVO) 480 mg in sodium chloride 0.9 % 100 mL chemo infusion, 480 mg, Intravenous, Once, 0 of 6 cycles ? ? for chemotherapy treatment.  ? ?  ? ? ?Current Therapy: Cabozantinib 40 mg daily started on 04/29/21 ? ?Interval history- [name] is a 76 year old male with oncologic history as above presenting to [facility] today with chief complaint of nausea and decreased appetite x4 days.  Patient's daughter accompanies him and provides additional history.  Patient recently started oral chemo x1 month ago and has been handling it well overall he thinks.  He noticed his appetite decreasing over the last 4 days. He  vomited twice in the last 2 days and reports emesis was non bloody and non bilious. He has a prescription for nausea medicine at the pharmacy he has not picked up yet. He states around 4 am he had abdominal pain in his left upper quadrant that resolved after eating a bowl of cereal. He describes pain being "similar to hunger pains." His last bowel movement was 2-3 days ago. He typically goes daily and denies history of constipation although states his bowels have been "leaking" over the last month. No OTC medications tried for symptoms prior to arrival. He also endorses feeling fatigued and decreased energy. Denies any sick contacts or known covid exposures. Denies any fever, chills, cough, chest pain, urinary symptoms, hemoptysis, blood in stool.  ? ? ? ?ROS  ?All other systems are reviewed and are negative for acute change except as noted in the HPI. ? ? ? ?Allergies  ?Allergen Reactions  ? Benadryl [Diphenhydramine] Other (See Comments)  ?  Severe restless legs  ? ? ? ?Past Medical History:  ?Diagnosis Date  ? Arthritis   ? Chronic kidney disease   ? only has one kidney   ? 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 02/01/2015  ? Coronary artery disease   ? followed by Dr.Tilley  ? GERD (gastroesophageal reflux disease)   ? Heart murmur   ? High cholesterol   ? Hx of cancer of lung 1980's  ? Hypertension   ? Incisional hernia 08/01/2015  ? Lung cancer ([place]) 1993  ? Lung metastases ([place])  2019  ? Recurrent umbilical hernia 03/14/5168  ? Right renal mass   ? ? ? ?Past Surgical History:  ?Procedure Laterality Date  ? APPENDECTOMY  age 28  ? CHOLECYSTECTOMY  1980's  ? LAPAROSCOPIC LYSIS OF ADHESIONS N/A 08/01/2015  ? Procedure: LAPAROSCOPIC LYSIS OF ADHESIONS;  Surgeon: Alex Boston, MD;  Location: [facility];  Service: General;  Laterality: N/A;  ? LUNG LOBECTOMY  1992  ? lung cancer- patient has staples in lung not to have MRI per patient   ? PILONIDAL CYST EXCISION  2011  ? Dr [name]  ? ROBOTIC  ASSITED PARTIAL NEPHRECTOMY Right 02/01/2015  ? Procedure: RIGHT ROBOTIC ASSISTED LAPAROSOCOPY NEPHRECTOMY;  Surgeon: Alex Hughs, MD;  Location: [facility];  Service: Urology;  Laterality: Right;  ? VENTRAL HERNIA REPAIR N/A 08/01/2015  ? Procedure: LAPAROSCOPIC VENTRAL WALL HERNIA WITH MESH;  Surgeon: Alex Boston, MD;  Location: [facility];  Service: General;  Laterality: N/A;  ? ? ?Social History  ? ?Socioeconomic History  ? Marital status: Widowed  ?  Spouse name: Not on file  ? Number of children: Not on file  ? Years of education: Not on file  ? Highest education level: Not on file  ?Occupational History  ? Occupation: Retired  ?Tobacco Use  ? Smoking status: Former  ?  Packs/day: 1.00  ?  Years: 20.00  ?  Pack years: 20.00  ?  Types: Cigarettes  ?  Quit date: 01/29/1988  ?  Years since quitting: 33.3  ? Smokeless tobacco: Never  ?Substance and Sexual Activity  ? Alcohol use: Yes  ?  Comment: rare  ? Drug use: No  ? Sexual activity: Not on file  ?Other Topics Concern  ? Not on file  ?Social History Narrative  ? Not on file  ? ?Social Determinants of Health  ? ?Financial Resource Strain: Not on file  ?Food Insecurity: Not on file  ?Transportation Needs: Not on file  ?Physical Activity: Not on file  ?Stress: Not on file  ?Social Connections: Not on file  ?Intimate Partner Violence: Not on file  ? ? ?Family History  ?Problem Relation Age of Onset  ? Heart attack Father   ? ? ? ?Current Outpatient Medications:  ?  acetaminophen (TYLENOL) 325 MG tablet, Take 2 tablets (650 mg total) by mouth every 6 (six) hours as needed for mild pain or headache (over the counter). (Patient taking differently: Take 650 mg by mouth every 6 (six) hours as needed for mild pain or headache.), Disp: 30 tablet, Rfl: 3 ?  aspirin 81 MG chewable tablet, Chew 81 mg by mouth daily., Disp: , Rfl:  ?  cabozantinib (CABOMETYX) 40 MG tablet, Take 1 tablet (40 mg total) by mouth daily. Take on an empty stomach, 1 hour before or 2 hours after  meals., Disp: 30 tablet, Rfl: 1 ?  Cholecalciferol (VITAMIN D3) 50 MCG (2000 UT) TABS, Take 2,000 Units by mouth daily., Disp: , Rfl:  ?  diltiazem (CARDIZEM CD) 120 MG 24 hr capsule, Take 120 mg by mouth in the morning., Disp: , Rfl:  ?  famotidine (PEPCID) 20 MG tablet, Take 20 mg by mouth See admin instructions. Take 20 mg by mouth at 3 PM daily and an additional 20 mg once a day as needed for heartburn or indigestion, Disp: , Rfl:  ?  guaiFENesin-dextromethorphan (ROBITUSSIN DM) 100-10 MG/5ML syrup, Take 5 mLs by mouth every 4 (four) hours as needed for cough., Disp: 118 mL, Rfl: 0 ?  hydrocortisone (  CORTEF) 10 MG tablet, Take 5-10 mg by mouth See admin instructions. Take 10 mg by mouth in the morning and 5 mg in the afternoon, Disp: , Rfl:  ?  hydrocortisone (CORTEF) 5 MG tablet, Take 2 tablets in am and 1 tablet in pm, ~ 2pm. (Patient not taking: Reported on 03/25/2021), Disp: 300 tablet, Rfl: 3 ?  levothyroxine (SYNTHROID) 150 MCG tablet, TAKE 1 TABLET BY MOUTH EVERY OTHER DAY (Patient taking differently: Take 150 mcg by mouth See admin instructions. Take 150 mcg by mouth every other morning before breakfast), Disp: 30 tablet, Rfl: 3 ?  levothyroxine (SYNTHROID) 175 MCG tablet, TAKE 1 TABLET BY MOUTH EVERY OTHER DAY (Patient taking differently: Take 175 mcg by mouth See admin instructions. Take 175 mcg by mouth every other morning before breakfast), Disp: 30 tablet, Rfl: 3 ?  Multiple Vitamins-Minerals (CENTRUM SILVER 50+MEN) TABS, Take 1 tablet by mouth daily with breakfast., Disp: , Rfl:  ?  temazepam (RESTORIL) 7.5 MG capsule, Take 1 capsule (7.5 mg total) by mouth at bedtime as needed for sleep. (Patient not taking: Reported on 03/25/2021), Disp: 30 capsule, Rfl: 0 ?  traZODone (DESYREL) 150 MG tablet, Take 150 mg by mouth at bedtime as needed for sleep., Disp: , Rfl:  ? ?PHYSICAL EXAM: ?ECOG FS:1 - Symptomatic but completely ambulatory ? ?  ?Vitals:  ? 06/03/21 1258  ?BP: (!) 144/81  ?Pulse: 90  ?Resp: 16   ?Temp: 98.7 ?F (37.1 ?C)  ?SpO2: 98%  ? ?Physical Exam ?Vitals and nursing note reviewed.  ?Constitutional:   ?   Appearance: He is well-developed. He is not ill-appearing or toxic-appearing.  ?HENT:  ?   Head: No

## 2021-06-03 NOTE — Patient Instructions (Signed)
Rehydration, Adult Rehydration is the replacement of body fluids, salts, and minerals (electrolytes) that are lost during dehydration. Dehydration is when there is not enough water or other fluids in the body. This happens when you lose more fluids than you take in. Common causes of dehydration include: Not drinking enough fluids. This can occur when you are ill or doing activities that require a lot of energy, especially in hot weather. Conditions that cause loss of water or other fluids, such as diarrhea, vomiting, sweating, or urinating a lot. Other illnesses, such as fever or infection. Certain medicines, such as those that remove excess fluid from the body (diuretics). Symptoms of mild or moderate dehydration may include thirst, dry lips and mouth, and dizziness. Symptoms of severe dehydration may include increased heart rate, confusion, fainting, and not urinating. For severe dehydration, you may need to get fluids through an IV at the hospital. For mild or moderate dehydration, you can usually rehydrate at home by drinking certain fluids as told by your health care provider. What are the risks? Generally, rehydration is safe. However, taking in too much fluid (overhydration) can be a problem. This is rare. Overhydration can cause an electrolyte imbalance, kidney failure, or a decrease in salt (sodium) levels in the body. Supplies needed You will need an oral rehydration solution (ORS) if your health care provider tells you to use one. This is a drink to treat dehydration. It can be found in pharmacies and retail stores. How to rehydrate Fluids Follow instructions from your health care provider for rehydration. The kind of fluid and the amount you should drink depend on your condition. In general, you should choose drinks that you prefer. If told by your health care provider, drink an ORS. Make an ORS by following instructions on the package. Start by drinking small amounts, about  cup (120  mL) every 5-10 minutes. Slowly increase how much you drink until you have taken the amount recommended by your health care provider. Drink enough clear fluids to keep your urine pale yellow. If you were told to drink an ORS, finish it first, then start slowly drinking other clear fluids. Drink fluids such as: Water. This includes sparkling water and flavored water. Drinking only water can lead to having too little sodium in your body (hyponatremia). Follow the advice of your health care provider. Water from ice chips you suck on. Fruit juice with water you add to it (diluted). Sports drinks. Hot or cold herbal teas. Broth-based soups. Milk or milk products. Food Follow instructions from your health care provider about what to eat while you rehydrate. Your health care provider may recommend that you slowly begin eating regular foods in small amounts. Eat foods that contain a healthy balance of electrolytes, such as bananas, oranges, potatoes, tomatoes, and spinach. Avoid foods that are greasy or contain a lot of sugar. In some cases, you may get nutrition through a feeding tube that is passed through your nose and into your stomach (nasogastric tube, or NG tube). This may be done if you have uncontrolled vomiting or diarrhea. Beverages to avoid  Certain beverages may make dehydration worse. While you rehydrate, avoid drinking alcohol. How to tell if you are recovering from dehydration You may be recovering from dehydration if: You are urinating more often than before you started rehydrating. Your urine is pale yellow. Your energy level improves. You vomit less frequently. You have diarrhea less frequently. Your appetite improves or returns to normal. You feel less dizzy or less light-headed.   Your skin tone and color start to look more normal. Follow these instructions at home: Take over-the-counter and prescription medicines only as told by your health care provider. Do not take sodium  tablets. Doing this can lead to having too much sodium in your body (hypernatremia). Contact a health care provider if: You continue to have symptoms of mild or moderate dehydration, such as: Thirst. Dry lips. Slightly dry mouth. Dizziness. Dark urine or less urine than normal. Muscle cramps. You continue to vomit or have diarrhea. Get help right away if you: Have symptoms of dehydration that get worse. Have a fever. Have a severe headache. Have been vomiting and the following happens: Your vomiting gets worse or does not go away. Your vomit includes blood or green matter (bile). You cannot eat or drink without vomiting. Have problems with urination or bowel movements, such as: Diarrhea that gets worse or does not go away. Blood in your stool (feces). This may cause stool to look black and tarry. Not urinating, or urinating only a small amount of very dark urine, within 6-8 hours. Have trouble breathing. Have symptoms that get worse with treatment. These symptoms may represent a serious problem that is an emergency. Do not wait to see if the symptoms will go away. Get medical help right away. Call your local emergency services (911 in the [place] Do not drive yourself to the hospital. Summary Rehydration is the replacement of body fluids and minerals (electrolytes) that are lost during dehydration. Follow instructions from your health care provider for rehydration. The kind of fluid and amount you should drink depend on your condition. Slowly increase how much you drink until you have taken the amount recommended by your health care provider. Contact your health care provider if you continue to show signs of mild or moderate dehydration. This information is not intended to replace advice given to you by your health care provider. Make sure you discuss any questions you have with your health care provider. Document Revised: 04/05/2019 Document Reviewed: 02/13/2019 Elsevier Patient  Education  2023 [employer]  

## 2021-06-03 NOTE — Telephone Encounter (Signed)
This RN spoke with patient's daughter who states patient has had decreased appetite and weakness X 2 days. Patient's daughter reports 1 episode of emesis but denies fevers and diarrhea. Per Dr. Alen Blew, patient to be seen in [facility] today for evaluation. Patient's daughter verbalized understanding that appointment will be made for 1p today.  ?

## 2021-06-03 NOTE — Progress Notes (Deleted)
Symptom Management Consult note [facility]    Patient Care Team: [name], MD as PCP - General (Family Medicine) Croitoru, Rachelle Hora, MD as PCP - Cardiology (Cardiology) Karie Soda, MD as Consulting Physician (General Surgery) Crist Fat, MD as Consulting Physician (Urology)    Name of the patient: [name]  [id number]  1945/08/20   Date of visit: 06/03/2021    Chief complaint/ Reason for visit-weakness, decreased appetite and emesis  Oncology History  Kidney cancer, primary, with metastasis from kidney to other site [facility])  09/14/2017 Initial Diagnosis   Kidney cancer, primary, with metastasis from kidney to other site [facility])    09/21/2017 - 12/30/2017 Chemotherapy   The patient had ipilimumab (YERVOY) 100 mg in sodium chloride 0.9 % 50 mL chemo infusion, 1.06 mg/kg = 95 mg, Intravenous,  Once, 4 of 4 cycles Administration: 100 mg (09/21/2017), 100 mg (10/13/2017), 100 mg (11/03/2017), 100 mg (11/24/2017) nivolumab (OPDIVO) 280 mg in sodium chloride 0.9 % 100 mL chemo infusion, 2.93 mg/kg = 287 mg, Intravenous, Once, 6 of 9 cycles Administration: 280 mg (09/21/2017), 240 mg (12/15/2017), 280 mg (10/13/2017), 280 mg (11/03/2017), 280 mg (11/24/2017), 240 mg (12/30/2017)   for chemotherapy treatment.     03/24/2018 - 03/24/2018 Chemotherapy   The patient had nivolumab (OPDIVO) 480 mg in sodium chloride 0.9 % 100 mL chemo infusion, 480 mg, Intravenous, Once, 0 of 6 cycles   for chemotherapy treatment.       Current Therapy: ***  Interval history- [name] is a 76 yo male with oncologic history as above  Denies any neurologic complaints. Denies recent fevers or illnesses. Denies any easy bleeding or bruising. Reports good appetite and denies weight loss. Denies chest pain. Denies any nausea, vomiting, constipation, or diarrhea. Denies urinary complaints. Patient offers no further specific complaints today.    ROS     Allergies  Allergen  Reactions   Benadryl [Diphenhydramine] Other (See Comments)    Severe restless legs     Past Medical History:  Diagnosis Date   Arthritis    Chronic kidney disease    only has one kidney    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 02/01/2015   Coronary artery disease    followed by Dr.Tilley   GERD (gastroesophageal reflux disease)    Heart murmur    High cholesterol    Hx of cancer of lung 1980's   Hypertension    Incisional hernia 08/01/2015   Lung cancer (HCC) 1993   Lung metastases (HCC) 2019   Recurrent umbilical hernia 08/01/2015   Right renal mass      Past Surgical History:  Procedure Laterality Date   APPENDECTOMY  age 49   CHOLECYSTECTOMY  1980's   LAPAROSCOPIC LYSIS OF ADHESIONS N/A 08/01/2015   Procedure: LAPAROSCOPIC LYSIS OF ADHESIONS;  Surgeon: Karie Soda, MD;  Location: [facility];  Service: General;  Laterality: N/A;   LUNG LOBECTOMY  1992   lung cancer- patient has staples in lung not to have MRI per patient    PILONIDAL CYST EXCISION  2011   Dr Gerrit Friends   ROBOTIC ASSITED PARTIAL NEPHRECTOMY Right 02/01/2015   Procedure: RIGHT ROBOTIC ASSISTED LAPAROSOCOPY NEPHRECTOMY;  Surgeon: Crist Fat, MD;  Location: [facility];  Service: Urology;  Laterality: Right;   VENTRAL HERNIA REPAIR N/A 08/01/2015   Procedure: LAPAROSCOPIC VENTRAL WALL HERNIA WITH MESH;  Surgeon: Karie Soda, MD;  Location: [facility];  Service: General;  Laterality:  N/A;    Social History   Socioeconomic History   Marital status: Widowed    Spouse name: Not on file   Number of children: Not on file   Years of education: Not on file   Highest education level: Not on file  Occupational History   Occupation: Retired  Tobacco Use   Smoking status: Former    Packs/day: 1.00    Years: 20.00    Pack years: 20.00    Types: Cigarettes    Quit date: 01/29/1988    Years since quitting: 33.3   Smokeless tobacco: Never  Substance and Sexual Activity   Alcohol use: Yes     Comment: rare   Drug use: No   Sexual activity: Not on file  Other Topics Concern   Not on file  Social History Narrative   Not on file   Social Determinants of Health   Financial Resource Strain: Not on file  Food Insecurity: Not on file  Transportation Needs: Not on file  Physical Activity: Not on file  Stress: Not on file  Social Connections: Not on file  Intimate Partner Violence: Not on file    Family History  Problem Relation Age of Onset   Heart attack Father      Current Outpatient Medications:    acetaminophen (TYLENOL) 325 MG tablet, Take 2 tablets (650 mg total) by mouth every 6 (six) hours as needed for mild pain or headache (over the counter). (Patient taking differently: Take 650 mg by mouth every 6 (six) hours as needed for mild pain or headache.), Disp: 30 tablet, Rfl: 3   aspirin 81 MG chewable tablet, Chew 81 mg by mouth daily., Disp: , Rfl:    cabozantinib (CABOMETYX) 40 MG tablet, Take 1 tablet (40 mg total) by mouth daily. Take on an empty stomach, 1 hour before or 2 hours after meals., Disp: 30 tablet, Rfl: 1   Cholecalciferol (VITAMIN D3) 50 MCG (2000 UT) TABS, Take 2,000 Units by mouth daily., Disp: , Rfl:    diltiazem (CARDIZEM CD) 120 MG 24 hr capsule, Take 120 mg by mouth in the morning., Disp: , Rfl:    famotidine (PEPCID) 20 MG tablet, Take 20 mg by mouth See admin instructions. Take 20 mg by mouth at 3 PM daily and an additional 20 mg once a day as needed for heartburn or indigestion, Disp: , Rfl:    guaiFENesin-dextromethorphan (ROBITUSSIN DM) 100-10 MG/5ML syrup, Take 5 mLs by mouth every 4 (four) hours as needed for cough., Disp: 118 mL, Rfl: 0   hydrocortisone (CORTEF) 10 MG tablet, Take 5-10 mg by mouth See admin instructions. Take 10 mg by mouth in the morning and 5 mg in the afternoon, Disp: , Rfl:    hydrocortisone (CORTEF) 5 MG tablet, Take 2 tablets in am and 1 tablet in pm, ~ 2pm. (Patient not taking: Reported on 03/25/2021), Disp: 300  tablet, Rfl: 3   levothyroxine (SYNTHROID) 150 MCG tablet, TAKE 1 TABLET BY MOUTH EVERY OTHER DAY (Patient taking differently: Take 150 mcg by mouth See admin instructions. Take 150 mcg by mouth every other morning before breakfast), Disp: 30 tablet, Rfl: 3   levothyroxine (SYNTHROID) 175 MCG tablet, TAKE 1 TABLET BY MOUTH EVERY OTHER DAY (Patient taking differently: Take 175 mcg by mouth See admin instructions. Take 175 mcg by mouth every other morning before breakfast), Disp: 30 tablet, Rfl: 3   Multiple Vitamins-Minerals (CENTRUM SILVER 50+MEN) TABS, Take 1 tablet by mouth daily with breakfast., Disp: ,  Rfl:    temazepam (RESTORIL) 7.5 MG capsule, Take 1 capsule (7.5 mg total) by mouth at bedtime as needed for sleep. (Patient not taking: Reported on 03/25/2021), Disp: 30 capsule, Rfl: 0   traZODone (DESYREL) 150 MG tablet, Take 150 mg by mouth at bedtime as needed for sleep., Disp: , Rfl:   PHYSICAL EXAM: ECOG FS:{CHL ONC MV:[id number]}   There were no vitals filed for this visit. Physical Exam     LABORATORY DATA: I have reviewed the data as listed    Latest Ref Rng & Units 04/22/2021    9:09 AM 03/28/2021    3:22 AM 03/27/2021    4:58 AM  CBC  WBC 4.0 - 10.5 K/uL 5.5   4.9   4.4    Hemoglobin 13.0 - 17.0 g/dL 84.1   9.7   8.5    Hematocrit 39.0 - 52.0 % 35.7   29.6   26.1    Platelets 150 - 400 K/uL 199   146   141          Latest Ref Rng & Units 04/22/2021    9:09 AM 03/28/2021    3:22 AM 03/27/2021    4:58 AM  CMP  Glucose 70 - 99 mg/dL 89   324   78    BUN 8 - 23 mg/dL 17   27   30     Creatinine 0.61 - 1.24 mg/dL 4.01   0.27   2.53    Sodium 135 - 145 mmol/L 140   141   141    Potassium 3.5 - 5.1 mmol/L 3.9   3.8   3.5    Chloride 98 - 111 mmol/L 105   110   115    CO2 22 - 32 mmol/L 30   26   20     Calcium 8.9 - 10.3 mg/dL 9.3   8.1   6.9    Total Protein 6.5 - 8.1 g/dL 7.5    5.2    Total Bilirubin 0.3 - 1.2 mg/dL 0.4    0.2    Alkaline Phos 38 - 126 U/L 43    21    AST  15 - 41 U/L 14    21    ALT 0 - 44 U/L 11    16         RADIOGRAPHIC STUDIES: I have personally reviewed the radiological images as listed and agreed with the findings in the report. No images are attached to the encounter. No results found.   ASSESSMENT & PLAN: Patient is a 76 y.o. male ***   Visit Diagnosis: No diagnosis found.   No orders of the defined types were placed in this encounter.   All questions were answered. The patient knows to call the clinic with any problems, questions or concerns. No barriers to learning was detected.  I have spent a total of *** minutes minutes of face-to-face and non-face-to-face time, preparing to see the patient, obtaining and/or reviewing separately obtained history, performing a medically appropriate examination, counseling and educating the patient, ordering tests,  documenting clinical information in the electronic health record, and care coordination.     Thank you for allowing me to participate in the care of this patient.    Shanon Ace, PA-C Department of Hematology/Oncology [facility] Phone: [phone]  [phone]    06/03/2021 10:30 AM

## 2021-06-03 NOTE — Telephone Encounter (Signed)
Patient's daughter, [name] called to say the patient has not been feeling well, very weak, not eating or drinking hardly at all x 2 days.  Has had one episode of vomiting, no diarrhea or fever. Dr. Alen Blew informed, patient to be seen in [facility], RN notified. ?

## 2021-06-04 ENCOUNTER — Encounter: Payer: Self-pay | Admitting: Oncology

## 2021-06-04 NOTE — Progress Notes (Signed)
Note opened in error. Please see clinic note in other encounter for this same date 06/03/2021. ?

## 2021-06-06 ENCOUNTER — Telehealth: Payer: Self-pay | Admitting: Oncology

## 2021-06-06 NOTE — Telephone Encounter (Signed)
Called patient regarding upcoming appointment, patient Is notified.  ?

## 2021-06-09 ENCOUNTER — Other Ambulatory Visit: Payer: Self-pay | Admitting: Oncology

## 2021-06-09 DIAGNOSIS — C641 Malignant neoplasm of right kidney, except renal pelvis: Secondary | ICD-10-CM

## 2021-06-11 ENCOUNTER — Other Ambulatory Visit: Payer: Medicare HMO

## 2021-06-11 ENCOUNTER — Other Ambulatory Visit (HOSPITAL_COMMUNITY): Payer: Self-pay

## 2021-06-11 ENCOUNTER — Ambulatory Visit: Payer: Medicare HMO | Admitting: Oncology

## 2021-06-11 ENCOUNTER — Inpatient Hospital Stay: Payer: Medicare HMO | Admitting: Oncology

## 2021-06-11 ENCOUNTER — Inpatient Hospital Stay (HOSPITAL_BASED_OUTPATIENT_CLINIC_OR_DEPARTMENT_OTHER): Payer: Medicare HMO

## 2021-06-11 ENCOUNTER — Other Ambulatory Visit: Payer: Self-pay

## 2021-06-11 VITALS — BP 156/85 | HR 69 | Temp 97.7°F | Resp 17 | Ht 69.0 in | Wt 182.9 lb

## 2021-06-11 DIAGNOSIS — C641 Malignant neoplasm of right kidney, except renal pelvis: Secondary | ICD-10-CM

## 2021-06-11 DIAGNOSIS — C787 Secondary malignant neoplasm of liver and intrahepatic bile duct: Secondary | ICD-10-CM | POA: Diagnosis not present

## 2021-06-11 DIAGNOSIS — N19 Unspecified kidney failure: Secondary | ICD-10-CM | POA: Diagnosis not present

## 2021-06-11 DIAGNOSIS — R634 Abnormal weight loss: Secondary | ICD-10-CM | POA: Diagnosis not present

## 2021-06-11 DIAGNOSIS — R630 Anorexia: Secondary | ICD-10-CM | POA: Diagnosis not present

## 2021-06-11 DIAGNOSIS — Z79899 Other long term (current) drug therapy: Secondary | ICD-10-CM | POA: Diagnosis not present

## 2021-06-11 DIAGNOSIS — C786 Secondary malignant neoplasm of retroperitoneum and peritoneum: Secondary | ICD-10-CM | POA: Diagnosis not present

## 2021-06-11 DIAGNOSIS — C649 Malignant neoplasm of unspecified kidney, except renal pelvis: Secondary | ICD-10-CM | POA: Diagnosis not present

## 2021-06-11 LAB — CBC WITH DIFFERENTIAL (CANCER CENTER ONLY)
Abs Immature Granulocytes: 0 10*3/uL (ref 0.00–0.07)
Basophils Absolute: 0 10*3/uL (ref 0.0–0.1)
Basophils Relative: 0 %
Eosinophils Absolute: 0.2 10*3/uL (ref 0.0–0.5)
Eosinophils Relative: 4 %
HCT: 38.8 % — ABNORMAL LOW (ref 39.0–52.0)
Hemoglobin: 13.2 g/dL (ref 13.0–17.0)
Immature Granulocytes: 0 %
Lymphocytes Relative: 50 %
Lymphs Abs: 2.4 10*3/uL (ref 0.7–4.0)
MCH: 28.8 pg (ref 26.0–34.0)
MCHC: 34 g/dL (ref 30.0–36.0)
MCV: 84.7 fL (ref 80.0–100.0)
Monocytes Absolute: 0.4 10*3/uL (ref 0.1–1.0)
Monocytes Relative: 7 %
Neutro Abs: 1.9 10*3/uL (ref 1.7–7.7)
Neutrophils Relative %: 39 %
Platelet Count: 186 10*3/uL (ref 150–400)
RBC: 4.58 MIL/uL (ref 4.22–5.81)
RDW: 14.6 % (ref 11.5–15.5)
WBC Count: 4.9 10*3/uL (ref 4.0–10.5)
nRBC: 0 % (ref 0.0–0.2)

## 2021-06-11 LAB — CMP (CANCER CENTER ONLY)
ALT: 61 U/L — ABNORMAL HIGH (ref 0–44)
AST: 40 U/L (ref 15–41)
Albumin: 3.8 g/dL (ref 3.5–5.0)
Alkaline Phosphatase: 69 U/L (ref 38–126)
Anion gap: 5 (ref 5–15)
BUN: 18 mg/dL (ref 8–23)
CO2: 30 mmol/L (ref 22–32)
Calcium: 8.3 mg/dL — ABNORMAL LOW (ref 8.9–10.3)
Chloride: 105 mmol/L (ref 98–111)
Creatinine: 1.37 mg/dL — ABNORMAL HIGH (ref 0.61–1.24)
GFR, Estimated: 53 mL/min — ABNORMAL LOW (ref >60)
Glucose, Bld: 85 mg/dL (ref 70–99)
Potassium: 4 mmol/L (ref 3.5–5.1)
Sodium: 140 mmol/L (ref 135–145)
Total Bilirubin: 0.4 mg/dL (ref 0.3–1.2)
Total Protein: 6.6 g/dL (ref 6.5–8.1)

## 2021-06-11 MED ORDER — CABOMETYX 20 MG PO TABS
20.0000 mg | ORAL_TABLET | Freq: Every day | ORAL | 1 refills | Status: DC
  Filled 2021-06-11 – 2021-06-19 (×2): qty 30, 30d supply, fill #0
  Filled 2021-07-24: qty 30, 30d supply, fill #1

## 2021-06-11 NOTE — Progress Notes (Signed)
Hematology and Oncology Follow Up Visit ? ?Cuahutemoc Attar ?[id number] ?[birth year] 76 y.o. ?06/11/2021 8:40 AM ?[name], [place], [name], *  ? ?Principle Diagnosis: 76 year old man with kidney cancer diagnosed in 2016.  He developed stage IV clear-cell renal cell carcinoma with hepatic and omental involvement in 2019.   ? ?Prior Therapy: ?He underwent a radical nephrectomy completed by Dr. Louis Meckel on February 01, 2015.  The final pathology revealed a 3.3 cm clear cell renal cell carcinoma with Fuhrman grade 3.  Margins were negative indicating stage T1 a disease ? ?He is status post omental biopsy in July 2019 which confirmed the presence of recurrent metastatic renal cell carcinoma. ? ?Nivolumab at 3 mg/mg and ipilimumab at 1 mg/kg cycle 1 started on 09/21/2017.  He completed 4 cycles of therapy.   He did not receive any additional immunotherapy after developing adrenal insufficiency and panhypopituitarism. ? ?He is status post radiation therapy to abdominal lymph node completed on November 08, 2019.  He received total of 30 Gy in 10 fractions ? ?Current therapy: Cabometyx 40 mg daily started in March 2023. ? ?Interim History: Mr. Staheli is here for repeat evaluation.  Since the last visit, he started Cabometyx and did develop GI toxicity associated with it.  He does include anorexia and weight loss.  He was seen at the symptom management clinic last week with improvement in his nausea symptoms but still eating poorly.  He denies any diarrhea or hematochezia.  He has reported some palpitation and increased in his blood pressure.  His performance status quality of life remains about the same. ? ? ? ? ? ? ? ? ? ? ?. ? ? ? ? ? ?Medications: Updated on review. ?Current Outpatient Medications  ?Medication Sig Dispense Refill  ? acetaminophen (TYLENOL) 325 MG tablet Take 2 tablets (650 mg total) by mouth every 6 (six) hours as needed for mild pain or headache (over the counter). (Patient taking differently:  Take 650 mg by mouth every 6 (six) hours as needed for mild pain or headache.) 30 tablet 3  ? aspirin 81 MG chewable tablet Chew 81 mg by mouth daily.    ? cabozantinib (CABOMETYX) 40 MG tablet Take 1 tablet (40 mg total) by mouth daily. Take on an empty stomach, 1 hour before or 2 hours after meals. 30 tablet 1  ? Cholecalciferol (VITAMIN D3) 50 MCG (2000 UT) TABS Take 2,000 Units by mouth daily.    ? diltiazem (CARDIZEM CD) 120 MG 24 hr capsule Take 120 mg by mouth in the morning.    ? famotidine (PEPCID) 20 MG tablet Take 20 mg by mouth See admin instructions. Take 20 mg by mouth at 3 PM daily and an additional 20 mg once a day as needed for heartburn or indigestion    ? guaiFENesin-dextromethorphan (ROBITUSSIN DM) 100-10 MG/5ML syrup Take 5 mLs by mouth every 4 (four) hours as needed for cough. 118 mL 0  ? hydrocortisone (CORTEF) 10 MG tablet Take 5-10 mg by mouth See admin instructions. Take 10 mg by mouth in the morning and 5 mg in the afternoon    ? hydrocortisone (CORTEF) 5 MG tablet Take 2 tablets in am and 1 tablet in pm, ~ 2pm. (Patient not taking: Reported on 03/25/2021) 300 tablet 3  ? levothyroxine (SYNTHROID) 150 MCG tablet TAKE 1 TABLET BY MOUTH EVERY OTHER DAY (Patient taking differently: Take 150 mcg by mouth See admin instructions. Take 150 mcg by mouth every other morning before breakfast) 30 tablet  3  ? levothyroxine (SYNTHROID) 175 MCG tablet TAKE 1 TABLET BY MOUTH EVERY OTHER DAY (Patient taking differently: Take 175 mcg by mouth See admin instructions. Take 175 mcg by mouth every other morning before breakfast) 30 tablet 3  ? Multiple Vitamins-Minerals (CENTRUM SILVER 50+MEN) TABS Take 1 tablet by mouth daily with breakfast.    ? temazepam (RESTORIL) 7.5 MG capsule Take 1 capsule (7.5 mg total) by mouth at bedtime as needed for sleep. (Patient not taking: Reported on 03/25/2021) 30 capsule 0  ? traZODone (DESYREL) 150 MG tablet Take 150 mg by mouth at bedtime as needed for sleep.    ? ?No current  facility-administered medications for this visit.  ? ? ? ?Allergies:  ?Allergies  ?Allergen Reactions  ? Benadryl [Diphenhydramine] Other (See Comments)  ?  Severe restless legs  ? ? ? ? ? ? ?Physical Exam: ? ? ? ? ? ? ? ?Blood pressure (!) 156/85, pulse 69, temperature 97.7 ?F (36.5 ?C), temperature source Temporal, resp. rate 17, height 5\' 9"  (1.753 m), weight 182 lb 14.4 oz (83 kg), SpO2 97 %. ? ? ? ? ? ? ?ECOG: 1 ? ? ?General appearance: Comfortable appearing without any discomfort ?Head: Normocephalic without any trauma ?Oropharynx: Mucous membranes are moist and pink without any thrush or ulcers. ?Eyes: Pupils are equal and round reactive to light. ?Lymph nodes: No cervical, supraclavicular, inguinal or axillary lymphadenopathy.   ?Heart:regular rate and rhythm.  S1 and S2 without leg edema. ?Lung: Clear without any rhonchi or wheezes.  No dullness to percussion. ?Abdomin: Soft, nontender, nondistended with good bowel sounds.  No hepatosplenomegaly. ?Musculoskeletal: No joint deformity or effusion.  Full range of motion noted. ?Neurological: No deficits noted on motor, sensory and deep tendon reflex exam. ?Skin: No petechial rash or dryness.  Appeared moist.  ? ?  ? ? ? ? ? ? ? ? ? ? ? ? ?Lab Results: ?Lab Results  ?Component Value Date  ? WBC 6.8 06/03/2021  ? HGB 13.8 06/03/2021  ? HCT 38.0 (L) 06/03/2021  ? MCV 81.4 06/03/2021  ? PLT 142 (L) 06/03/2021  ? ?  Chemistry   ?   ?Component Value Date/Time  ? NA 136 06/03/2021 1343  ? NA 134 03/11/2018 0913  ? K 3.9 06/03/2021 1343  ? CL 99 06/03/2021 1343  ? CO2 30 06/03/2021 1343  ? BUN 23 06/03/2021 1343  ? BUN 17 03/11/2018 0913  ? CREATININE 1.25 (H) 06/03/2021 1343  ?    ?Component Value Date/Time  ? CALCIUM 8.9 06/03/2021 1343  ? ALKPHOS 67 06/03/2021 1343  ? AST 34 06/03/2021 1343  ? ALT 49 (H) 06/03/2021 1343  ? BILITOT 0.5 06/03/2021 1343  ?  ? ? ? ? ? ?Impression and Plan: ? ?76 year old man with: ?  ?1.  Stage IV clear-cell renal cell carcinoma with  hepatic and peritoneal metastasis diagnosed in 2019. ? ? ?He is currently on Cabometyx and experienced a GI complications.  Risks and benefits of continuing this treatment versus lowering the dose to 20 mg versus switching to a different agent were reviewed.  After discussion today, I opted to reduce the dose to 20 mg daily and reassess him in 4 weeks.  We will obtain imaging studies in June 2023. ? ? ? ?2.  Immune mediated complications: He continues to be on hormone replacement related to potential hypophysitis. ? ?3.  Renal failure: Creatinine clearance continues to be around 60 cc/min without any further decline. ? ?4.  Prognosis  and goals of care: Therapy is palliative though aggressive measures are warranted given his reasonable performance status. ? ?5.  Follow-up: In 4 weeks for repeat evaluation. ? ? ?30  minutes were spent on this visit.  The time was dedicated to reviewing laboratory data, disease status update, addressing complications related to his cancer and cancer therapy. ? ? ?Zola Button, MD ?4/26/20238:40 AM  ? ? ? ?

## 2021-06-19 ENCOUNTER — Other Ambulatory Visit (HOSPITAL_COMMUNITY): Payer: Self-pay

## 2021-06-30 ENCOUNTER — Other Ambulatory Visit: Payer: Self-pay | Admitting: Internal Medicine

## 2021-07-08 ENCOUNTER — Other Ambulatory Visit (HOSPITAL_COMMUNITY): Payer: Self-pay

## 2021-07-09 ENCOUNTER — Inpatient Hospital Stay: Payer: Medicare HMO

## 2021-07-09 ENCOUNTER — Telehealth: Payer: Self-pay | Admitting: *Deleted

## 2021-07-09 ENCOUNTER — Other Ambulatory Visit: Payer: Self-pay

## 2021-07-09 ENCOUNTER — Ambulatory Visit (HOSPITAL_COMMUNITY)
Admission: RE | Admit: 2021-07-09 | Discharge: 2021-07-09 | Disposition: A | Discharge status: Home / Self Care | Payer: Medicare HMO | Source: Ambulatory Visit | Attending: Physician Assistant | Admitting: Physician Assistant

## 2021-07-09 ENCOUNTER — Inpatient Hospital Stay: Payer: Medicare HMO | Attending: Oncology

## 2021-07-09 ENCOUNTER — Inpatient Hospital Stay (HOSPITAL_BASED_OUTPATIENT_CLINIC_OR_DEPARTMENT_OTHER): Payer: Medicare HMO | Admitting: Physician Assistant

## 2021-07-09 VITALS — BP 117/66 | HR 88 | Temp 98.4°F | Resp 14 | Wt 174.9 lb

## 2021-07-09 VITALS — BP 115/69 | HR 76 | Resp 14

## 2021-07-09 DIAGNOSIS — C649 Malignant neoplasm of unspecified kidney, except renal pelvis: Secondary | ICD-10-CM | POA: Diagnosis not present

## 2021-07-09 DIAGNOSIS — C7800 Secondary malignant neoplasm of unspecified lung: Secondary | ICD-10-CM | POA: Diagnosis not present

## 2021-07-09 DIAGNOSIS — R5383 Other fatigue: Secondary | ICD-10-CM | POA: Diagnosis not present

## 2021-07-09 DIAGNOSIS — R051 Acute cough: Secondary | ICD-10-CM | POA: Insufficient documentation

## 2021-07-09 DIAGNOSIS — R059 Cough, unspecified: Secondary | ICD-10-CM | POA: Diagnosis not present

## 2021-07-09 DIAGNOSIS — R638 Other symptoms and signs concerning food and fluid intake: Secondary | ICD-10-CM

## 2021-07-09 DIAGNOSIS — R197 Diarrhea, unspecified: Secondary | ICD-10-CM | POA: Insufficient documentation

## 2021-07-09 DIAGNOSIS — C641 Malignant neoplasm of right kidney, except renal pelvis: Secondary | ICD-10-CM

## 2021-07-09 LAB — CMP (CANCER CENTER ONLY)
ALT: 39 U/L (ref 0–44)
AST: 33 U/L (ref 15–41)
Albumin: 4 g/dL (ref 3.5–5.0)
Alkaline Phosphatase: 64 U/L (ref 38–126)
Anion gap: 5 (ref 5–15)
BUN: 25 mg/dL — ABNORMAL HIGH (ref 8–23)
CO2: 29 mmol/L (ref 22–32)
Calcium: 8.7 mg/dL — ABNORMAL LOW (ref 8.9–10.3)
Chloride: 102 mmol/L (ref 98–111)
Creatinine: 1.8 mg/dL — ABNORMAL HIGH (ref 0.61–1.24)
GFR, Estimated: 38 mL/min — ABNORMAL LOW (ref >60)
Glucose, Bld: 113 mg/dL — ABNORMAL HIGH (ref 70–99)
Potassium: 4.5 mmol/L (ref 3.5–5.1)
Sodium: 136 mmol/L (ref 135–145)
Total Bilirubin: 0.5 mg/dL (ref 0.3–1.2)
Total Protein: 7.2 g/dL (ref 6.5–8.1)

## 2021-07-09 LAB — CBC WITH DIFFERENTIAL (CANCER CENTER ONLY)
Abs Immature Granulocytes: 0 10*3/uL (ref 0.00–0.07)
Basophils Absolute: 0 10*3/uL (ref 0.0–0.1)
Basophils Relative: 0 %
Eosinophils Absolute: 0.2 10*3/uL (ref 0.0–0.5)
Eosinophils Relative: 4 %
HCT: 38 % — ABNORMAL LOW (ref 39.0–52.0)
Hemoglobin: 12.9 g/dL — ABNORMAL LOW (ref 13.0–17.0)
Immature Granulocytes: 0 %
Lymphocytes Relative: 35 %
Lymphs Abs: 1.6 10*3/uL (ref 0.7–4.0)
MCH: 30.4 pg (ref 26.0–34.0)
MCHC: 33.9 g/dL (ref 30.0–36.0)
MCV: 89.6 fL (ref 80.0–100.0)
Monocytes Absolute: 0.7 10*3/uL (ref 0.1–1.0)
Monocytes Relative: 16 %
Neutro Abs: 1.9 10*3/uL (ref 1.7–7.7)
Neutrophils Relative %: 45 %
Platelet Count: 151 10*3/uL (ref 150–400)
RBC: 4.24 MIL/uL (ref 4.22–5.81)
RDW: 16.3 % — ABNORMAL HIGH (ref 11.5–15.5)
WBC Count: 4.4 10*3/uL (ref 4.0–10.5)
nRBC: 0 % (ref 0.0–0.2)

## 2021-07-09 LAB — URINALYSIS, COMPLETE (UACMP) WITH MICROSCOPIC
Bilirubin Urine: NEGATIVE
Glucose, UA: NEGATIVE mg/dL
Hgb urine dipstick: NEGATIVE
Ketones, ur: NEGATIVE mg/dL
Leukocytes,Ua: NEGATIVE
Nitrite: NEGATIVE
Protein, ur: NEGATIVE mg/dL
Specific Gravity, Urine: 1.017 (ref 1.005–1.030)
pH: 5 (ref 5.0–8.0)

## 2021-07-09 LAB — URINALYSIS, MICROSCOPIC (REFLEX)
Bacteria, UA: NONE SEEN
RBC / HPF: NONE SEEN RBC/hpf (ref 0–5)
Squamous Epithelial / HPF: NONE SEEN (ref 0–5)

## 2021-07-09 MED ORDER — SODIUM CHLORIDE 0.9 % IV SOLN: Freq: Once | INTRAVENOUS | Status: AC

## 2021-07-09 MED ORDER — BENZONATATE 100 MG PO CAPS: 100.0000 mg | ORAL_CAPSULE | Freq: Three times a day (TID) | ORAL | 0 refills | Status: DC | PRN

## 2021-07-09 NOTE — Progress Notes (Signed)
Patient unable to provide stool sample during [facility] visit today. Anda Kraft, [place], aware and advises patient is ok to be discharged.

## 2021-07-09 NOTE — Telephone Encounter (Signed)
Returned PC to patient's daughter, [name] - informed her the patient can be seen in [facility] at [facility].  She states they can come in at this time.  [facility] informed.

## 2021-07-09 NOTE — Telephone Encounter (Signed)
-----   Message from Wyatt Portela, MD sent at 07/09/2021  9:17 AM EDT ----- Madaline Brilliant for [facility] for fluids and labs. Thanks ----- Message ----- From: Rolene Course, RN Sent: 07/09/2021   8:55 AM EDT To: Wyatt Portela, MD  Mr Kommer's daughter [name] called, he is not feeling well, started having cold sx's over the weekend & had diarrhea all day yesterday, she says he is not eating or drinking very much.  He hasn't had any vomiting or fever.  He hasn't been taking immodium which I told her he definitely needs to have on hand.  She is asking if you think he needs to come in for fluids at [facility].  Please advise.  Thanks, Bethena Roys

## 2021-07-09 NOTE — Progress Notes (Signed)
Symptom Management Consult note [place]    Patient Care Team: [name], MD as PCP - General (Family Medicine) Croitoru, Dani Gobble, MD as PCP - Cardiology (Cardiology) Michael Boston, MD as Consulting Physician (General Surgery) Ardis Hughs, MD as Consulting Physician (Urology)    Name of the patient: [name]  [id number]  [birth year]   Date of visit: 07/09/2021    Chief complaint/ Reason for visit- diarrhea, cold symptoms  Oncology History  Kidney cancer, primary, with metastasis from kidney to other site [facility])  09/14/2017 Initial Diagnosis   Kidney cancer, primary, with metastasis from kidney to other site [facility])    09/21/2017 - 12/30/2017 Chemotherapy   The patient had ipilimumab (YERVOY) 100 mg in sodium chloride 0.9 % 50 mL chemo infusion, 1.06 mg/kg = 95 mg, Intravenous,  Once, 4 of 4 cycles Administration: 100 mg (09/21/2017), 100 mg (10/13/2017), 100 mg (11/03/2017), 100 mg (11/24/2017) nivolumab (OPDIVO) 280 mg in sodium chloride 0.9 % 100 mL chemo infusion, 2.93 mg/kg = 287 mg, Intravenous, Once, 6 of 9 cycles Administration: 280 mg (09/21/2017), 240 mg (12/15/2017), 280 mg (10/13/2017), 280 mg (11/03/2017), 280 mg (11/24/2017), 240 mg (12/30/2017)   for chemotherapy treatment.     03/24/2018 - 03/24/2018 Chemotherapy   The patient had nivolumab (OPDIVO) 480 mg in sodium chloride 0.9 % 100 mL chemo infusion, 480 mg, Intravenous, Once, 0 of 6 cycles   for chemotherapy treatment.       Current Therapy: Cabometyx PO daily  Interval history- [name] is a 76 yo male with oncologic history as above presenting to [facility] today with chief complaint of diarrhea and cold symptoms.  Patient's daughter accompanies him and provides additional history.  Patient states that his cold symptoms have been ongoing x4 days and he endorses the productive cough with yellow sputum, mild congestion, and sore throat. He rates sore throat 5/10 in severity.  He has tried  gargling warm salt water and taking Tylenol which has helped the sore throat. Last dose of tylenol was at 9 am this morning. Patient has not been around any sick contacts.  He has been checking his temperature at home and denies any fever.  He admits to decreased p.o. intake because he has been feeling poorly.  He states with the decreased PO intake he noticed his urine was dark in color. Denies UTI symptoms. He states he had diarrhea yesterday, estimating 5 episodes.  He describes stool as liquid.  Denies any blood in stool.  Has not taken any Imodium for this.  He denies any associated abdominal pain.   Patient dose reduction of Cabometyx starting 06/12/21. Daughter states the first 10 days or so he seemed to be more active and less fatigued. Denies chills, chest pain, hemoptysis, shortness of breath, abdominal pain. Denies any recent travel or antibiotic use.    ROS  All other systems are reviewed and are negative for acute change except as noted in the HPI.    Allergies  Allergen Reactions   Benadryl [Diphenhydramine] Other (See Comments)    Severe restless legs     Past Medical History:  Diagnosis Date   Arthritis    Chronic kidney disease    only has one kidney    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 02/01/2015   Coronary artery disease    followed by Dr.Tilley   GERD (gastroesophageal reflux disease)    Heart murmur    High cholesterol  Hx of cancer of lung 1980's   Hypertension    Incisional hernia 08/01/2015   Lung cancer ([place]) 1993   Lung metastases ([place]) 2019   Recurrent umbilical hernia 07/04/8414   Right renal mass      Past Surgical History:  Procedure Laterality Date   APPENDECTOMY  age 31   CHOLECYSTECTOMY  31's   LAPAROSCOPIC LYSIS OF ADHESIONS N/A 08/01/2015   Procedure: LAPAROSCOPIC LYSIS OF ADHESIONS;  Surgeon: Michael Boston, MD;  Location: [facility];  Service: General;  Laterality: N/A;   LUNG LOBECTOMY  1992   lung cancer-  patient has staples in lung not to have MRI per patient    PILONIDAL CYST EXCISION  2011   Dr Harlow Asa   ROBOTIC ASSITED PARTIAL NEPHRECTOMY Right 02/01/2015   Procedure: RIGHT ROBOTIC ASSISTED [place];  Surgeon: Ardis Hughs, MD;  Location: [facility];  Service: Urology;  Laterality: Right;   VENTRAL HERNIA REPAIR N/A 08/01/2015   Procedure: LAPAROSCOPIC VENTRAL WALL HERNIA WITH MESH;  Surgeon: Michael Boston, MD;  Location: [facility];  Service: General;  Laterality: N/A;    Social History   Socioeconomic History   Marital status: Widowed    Spouse name: Not on file   Number of children: Not on file   Years of education: Not on file   Highest education level: Not on file  Occupational History   Occupation: Retired  Tobacco Use   Smoking status: Former    Packs/day: 1.00    Years: 20.00    Pack years: 20.00    Types: Cigarettes    Quit date: 01/29/1988    Years since quitting: 33.4   Smokeless tobacco: Never  Substance and Sexual Activity   Alcohol use: Yes    Comment: rare   Drug use: No   Sexual activity: Not on file  Other Topics Concern   Not on file  Social History Narrative   Not on file   Social Determinants of Health   Financial Resource Strain: Not on file  Food Insecurity: Not on file  Transportation Needs: Not on file  Physical Activity: Not on file  Stress: Not on file  Social Connections: Not on file  Intimate Partner Violence: Not on file    Family History  Problem Relation Age of Onset   Heart attack Father      Current Outpatient Medications:    benzonatate (TESSALON) 100 MG capsule, Take 1 capsule (100 mg total) by mouth 3 (three) times daily as needed for cough., Disp: 20 capsule, Rfl: 0   acetaminophen (TYLENOL) 325 MG tablet, Take 2 tablets (650 mg total) by mouth every 6 (six) hours as needed for mild pain or headache (over the counter). (Patient taking differently: Take 650 mg by mouth every 6 (six) hours as needed for mild  pain or headache.), Disp: 30 tablet, Rfl: 3   aspirin 81 MG chewable tablet, Chew 81 mg by mouth daily., Disp: , Rfl:    cabozantinib (CABOMETYX) 20 MG tablet, Take 1 tablet (20 mg total) by mouth daily. Take on an empty stomach, 1 hour before or 2 hours after meals., Disp: 30 tablet, Rfl: 1   Cholecalciferol (VITAMIN D3) 50 MCG (2000 UT) TABS, Take 2,000 Units by mouth daily., Disp: , Rfl:    diltiazem (CARDIZEM CD) 120 MG 24 hr capsule, Take 120 mg by mouth in the morning., Disp: , Rfl:    ELIQUIS 5 MG TABS tablet, Take 5 mg by mouth 2 (two) times daily., Disp: ,  Rfl:    famotidine (PEPCID) 20 MG tablet, Take 20 mg by mouth See admin instructions. Take 20 mg by mouth at 3 PM daily and an additional 20 mg once a day as needed for heartburn or indigestion, Disp: , Rfl:    guaiFENesin-dextromethorphan (ROBITUSSIN DM) 100-10 MG/5ML syrup, Take 5 mLs by mouth every 4 (four) hours as needed for cough., Disp: 118 mL, Rfl: 0   hydrocortisone (CORTEF) 10 MG tablet, Take 5-10 mg by mouth See admin instructions. Take 10 mg by mouth in the morning and 5 mg in the afternoon, Disp: , Rfl:    hydrocortisone (CORTEF) 10 MG tablet, TAKE 1 TABLET BY MOUTH IN THE MORNING AND 1/2 TABLET IN THE EVENING AT 2 PM, Disp: 150 tablet, Rfl: 0   levothyroxine (SYNTHROID) 150 MCG tablet, TAKE 1 TABLET BY MOUTH EVERY OTHER DAY (Patient taking differently: Take 150 mcg by mouth See admin instructions. Take 150 mcg by mouth every other morning before breakfast), Disp: 30 tablet, Rfl: 3   levothyroxine (SYNTHROID) 175 MCG tablet, TAKE 1 TABLET BY MOUTH EVERY OTHER DAY (Patient taking differently: Take 175 mcg by mouth See admin instructions. Take 175 mcg by mouth every other morning before breakfast), Disp: 30 tablet, Rfl: 3   Multiple Vitamins-Minerals (CENTRUM SILVER 50+MEN) TABS, Take 1 tablet by mouth daily with breakfast., Disp: , Rfl:    temazepam (RESTORIL) 7.5 MG capsule, Take 1 capsule (7.5 mg total) by mouth at bedtime as  needed for sleep., Disp: 30 capsule, Rfl: 0   traZODone (DESYREL) 150 MG tablet, Take 150 mg by mouth at bedtime as needed for sleep., Disp: , Rfl:   PHYSICAL EXAM: ECOG FS:1 - Symptomatic but completely ambulatory    Vitals:   07/09/21 1139  BP: 117/66  Pulse: 88  Resp: 14  Temp: 98.4 F (36.9 C)  TempSrc: Oral  SpO2: 94%  Weight: 174 lb 14.4 oz (79.3 kg)   Physical Exam Vitals and nursing note reviewed.  Constitutional:      Appearance: He is well-developed. He is not ill-appearing or toxic-appearing.  HENT:     Head: Normocephalic and atraumatic.     Comments: No sinus or temporal tenderness.    Right Ear: External ear normal.     Left Ear: External ear normal.     Nose: Nose normal.     Mouth/Throat:     Mouth: Mucous membranes are dry.     Tongue: No lesions.     Palate: No lesions.     Pharynx: Oropharynx is clear. Uvula midline. No pharyngeal swelling, oropharyngeal exudate, posterior oropharyngeal erythema or uvula swelling.     Tonsils: No tonsillar exudate or tonsillar abscesses.     Comments: Airway is patent Eyes:     General: No scleral icterus.       Right eye: No discharge.        Left eye: No discharge.     Conjunctiva/sclera: Conjunctivae normal.  Neck:     Vascular: No JVD.  Cardiovascular:     Rate and Rhythm: Normal rate and regular rhythm.     Pulses: Normal pulses.     Heart sounds: Normal heart sounds.  Pulmonary:     Effort: Pulmonary effort is normal.     Comments: No rales, wheezing, or stridor heard on left. Normal work of breathing. Dry cough during exam Abdominal:     General: There is no distension.     Palpations: Abdomen is soft. There is no mass.  Tenderness: There is no abdominal tenderness. There is no right CVA tenderness, left CVA tenderness, guarding or rebound.     Hernia: No hernia is present.  Musculoskeletal:        General: Normal range of motion.     Cervical back: Normal range of motion.     Right lower leg: No  edema.     Left lower leg: No edema.  Lymphadenopathy:     Cervical: No cervical adenopathy.  Skin:    General: Skin is warm and dry.  Neurological:     Mental Status: He is oriented to person, place, and time.     GCS: GCS eye subscore is 4. GCS verbal subscore is 5. GCS motor subscore is 6.     Comments: Fluent speech, no facial droop.  Psychiatric:        Behavior: Behavior normal.       LABORATORY DATA: I have reviewed the data as listed    Latest Ref Rng & Units 07/09/2021   11:08 AM 06/11/2021    8:40 AM 06/03/2021    1:43 PM  CBC  WBC 4.0 - 10.5 K/uL 4.4   4.9   6.8    Hemoglobin 13.0 - 17.0 g/dL 12.9   13.2   13.8    Hematocrit 39.0 - 52.0 % 38.0   38.8   38.0    Platelets 150 - 400 K/uL 151   186   142          Latest Ref Rng & Units 07/09/2021   11:08 AM 06/11/2021    8:40 AM 06/03/2021    1:43 PM  CMP  Glucose 70 - 99 mg/dL 113   85   110    BUN 8 - 23 mg/dL 25   18   23     Creatinine 0.61 - 1.24 mg/dL 1.80   1.37   1.25    Sodium 135 - 145 mmol/L 136   140   136    Potassium 3.5 - 5.1 mmol/L 4.5   4.0   3.9    Chloride 98 - 111 mmol/L 102   105   99    CO2 22 - 32 mmol/L 29   30   30     Calcium 8.9 - 10.3 mg/dL 8.7   8.3   8.9    Total Protein 6.5 - 8.1 g/dL 7.2   6.6   6.7    Total Bilirubin 0.3 - 1.2 mg/dL 0.5   0.4   0.5    Alkaline Phos 38 - 126 U/L 64   69   67    AST 15 - 41 U/L 33   40   34    ALT 0 - 44 U/L 39   61   49         RADIOGRAPHIC STUDIES: I have personally reviewed the radiological images as listed and agreed with the findings in the report. No images are attached to the encounter. DG Chest 2 View  Result Date: 07/09/2021 CLINICAL DATA:  Cough EXAM: CHEST - 2 VIEW COMPARISON:  03/25/2021 FINDINGS: Elevation of the right hemidiaphragm. No consolidation or edema. Heart size is normal. No pleural effusion. IMPRESSION: No acute process in the chest. Electronically Signed   By: Macy Mis M.D.   On: 07/09/2021 15:04     ASSESSMENT  & PLAN: Patient is a 76 y.o. male with oncologic history of renal cell carcinoma s/p right nephrectomy and lung cancer followed by oncologist  Dr. Alen Blew.  #)Dehydration and fatigue- clinically looks dehydrated with dry mucus membranes. CMP without electrolyte derangement, does have elevated BUN/Creatinine (25/1.8 -> 18/1.37 x 4 weeks ago). Patient given liter of IVF here in clinic. UA without signs of infection.  #)URI symptoms- afebrile, nontoxic appearing. Clear lung sounds, no sinus tenderness. CBC without leukocytosis. Chest xray viewed by me shows no acute infectious processes. I agree with radiologist impression. Prescription sent to pharmacy for [place] for symptom management. Low suspicion for bacterial etiology given symptom duration. Vitals are reassuring as well.   #)Diarrhea- Patient unable to provide stool sample while here in clinic so c diff and PCR panel not able to be colleted. Patient has had diarrhea in the past and taken Imodium with improvement. Low suspicion for c diff at this time.   Discussed patient with oncologist Dr. Alen Blew who advises patient to stop Cabometyx for the next 7 days and asked that he call Bethena Roys RN to let her know how he is feeling at that time.   Visit Diagnosis: 1. Diarrhea, unspecified type   2. Poor fluid intake   3. Acute cough      Orders Placed This Encounter  Procedures   DG Chest 2 View    Standing Status:   Future    Number of Occurrences:   1    Standing Expiration Date:   07/09/2022    Order Specific Question:   Reason for Exam (SYMPTOM  OR DIAGNOSIS REQUIRED)    Answer:   cough    Order Specific Question:   Preferred imaging location?    Answer:   [facility]    All questions were answered. The patient knows to call the clinic with any problems, questions or concerns. No barriers to learning was detected.  I have spent a total of 30 minutes minutes of face-to-face and non-face-to-face time, preparing to see the  patient, obtaining and/or reviewing separately obtained history, performing a medically appropriate examination, counseling and educating the patient, ordering tests,  documenting clinical information in the electronic health record, and care coordination.     Thank you for allowing me to participate in the care of this patient.    Barrie Folk, PA-C Department of Hematology/Oncology [facility] at [facility] Phone: [phone]  [phone]    07/09/2021 3:36 PM

## 2021-07-11 ENCOUNTER — Other Ambulatory Visit (HOSPITAL_COMMUNITY): Payer: Self-pay

## 2021-07-15 ENCOUNTER — Inpatient Hospital Stay: Payer: Medicare HMO | Admitting: Oncology

## 2021-07-15 ENCOUNTER — Inpatient Hospital Stay: Payer: Medicare HMO

## 2021-07-15 ENCOUNTER — Telehealth: Payer: Self-pay | Admitting: Oncology

## 2021-07-15 NOTE — Telephone Encounter (Signed)
Scheduled per 5/30 in basket, pt has been called and confirmed

## 2021-07-16 ENCOUNTER — Other Ambulatory Visit (HOSPITAL_COMMUNITY): Payer: Self-pay

## 2021-07-23 ENCOUNTER — Ambulatory Visit (INDEPENDENT_AMBULATORY_CARE_PROVIDER_SITE_OTHER): Payer: Medicare HMO | Admitting: Internal Medicine

## 2021-07-23 ENCOUNTER — Encounter: Payer: Self-pay | Admitting: Internal Medicine

## 2021-07-23 VITALS — BP 126/72 | HR 76 | Ht 69.0 in | Wt 176.8 lb

## 2021-07-23 DIAGNOSIS — E2749 Other adrenocortical insufficiency: Secondary | ICD-10-CM | POA: Diagnosis not present

## 2021-07-23 DIAGNOSIS — E039 Hypothyroidism, unspecified: Secondary | ICD-10-CM | POA: Diagnosis not present

## 2021-07-23 LAB — T4, FREE: Free T4: 1.3 ng/dL (ref 0.60–1.60)

## 2021-07-23 LAB — TSH: TSH: 1.51 u[IU]/mL (ref 0.35–5.50)

## 2021-07-23 MED ORDER — HYDROCORTISONE 10 MG PO TABS: ORAL_TABLET | ORAL | 3 refills | Status: DC

## 2021-07-23 NOTE — Progress Notes (Signed)
Patient ID: [name], male   DOB: December 01, 1945, 76 y.o.   MRN: [id number]   HPI  [name] is a 76 y.o.-year-old male, returning for follow-up for hypothyroidism and adrenal insufficiency.  Last OV 6 months ago.  He is here with his daughter who offers part of the history especially regarding his symptoms and medication doses.  Interim history: He feels well, w/o complaints today. He did lose approximately 8 to 10 pounds since last visit.  No nausea/vomiting/diarrhea. He started back on Biologic therapy (cabozantinib) and also had a URI (no fever) - lost weight as he did not eat well. Now appetite improved. He did not have to double the dose of hydrocortisone since last visit or obtain it parenterally.  Reviewed history: Patient was admitted for weakness on 03/13/2018 with mild hyponatremia of 129.  However, he was recently admitted on 03/24/2018 with severe hyponatremia, at 115.  At that time, he was found to be profoundly hypothyroid and also adrenal insufficiency.  He was started on hormonal replacement for both conditions and his sodium was slowly repeated.  His sodium improved to normal before discharge.  He was sent home with levothyroxine and hydrocortisone.  He was advised to see endocrinology in 1 month.  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 He had recurrence in 09/2017 >> metastases in Lung, Liver, and L kidney. He is on ipilimumab Curt Bears) and Nivolumab (Opdivo) - started 09/2017, continued q3 weeks x 4 >> stopped after developing weakness in ~12/2017.  Reviewing his oncologist note, he will continue with p.o. Biologics if needed after he has a new CT scan.  He finished RxTx in 10/2019.  Primary Hypothyroidism: He was diagnosed with hypothyroidism in 03/2018 and started on levothyroxine. We subsequently increased the dose up to 175 mcg daily, last dose change 01/2020.  In 05/2020, his TSH was suppressed so we  decreased the dose of levothyroxine.  He takes levothyroxine 175 alternating with 150 mcg every other day: - misses it maybe 1x a month - in am, with Cabozantinib - fasting - at least 30 min from b'fast - no Ca, Fe, + MVI at night - stopped PPI - + Pepcid later in the day - not on Biotin  Reviewed his TFTs: Lab Results  Component Value Date   TSH 1.651 03/25/2021   TSH 3.06 01/20/2021   TSH 0.27 (L) 06/05/2020   TSH 2.48 03/06/2020   TSH 6.91 (H) 01/24/2020   TSH 7.45 (H) 12/04/2019   TSH 0.98 06/01/2019   TSH 2.15 04/20/2019   TSH 4.24 12/06/2018   TSH 5.47 (H) 10/31/2018   FREET4 1.33 01/20/2021   FREET4 1.24 06/05/2020   FREET4 1.07 03/06/2020   FREET4 1.08 01/24/2020   FREET4 0.89 12/04/2019   FREET4 1.18 06/01/2019   FREET4 1.04 04/20/2019   FREET4 1.01 12/06/2018   FREET4 1.00 10/31/2018   FREET4 0.92 09/14/2018   T3FREE 0.5 (L) 03/24/2018   Received labs from PCP, on 10/19/2018: CBC with low hemoglobin, at 12.5 CMP with high glucose at 178, BUN/creatinine 18/1.78, GFR 37, otherwise normal TSH 2.64, free T4 1.35  Antithyroid antibodies: No results found for: THGAB No components found for: TPOAB  Pt denies: - feeling nodules in neck - hoarseness - dysphagia - choking  No FH of thyroid disease or cancer. No h/o radiation tx to head or neck. No herbal supplements. No Biotin use. No recent steroids use.   Adrenal  insufficiency:  He was found to have a low cortisol, of 2.4 on 03/24/2018.    A cosyntropin stimulation test was abnormal the next day: Component     Latest Ref Rng & Units 03/24/2018 03/25/2018  Cortisol, Base     ug/dL  2.2  Cortisol, 30 Min     ug/dL  6.9  Cortisol, 60 Min     ug/dL  8.6  Cortisol, Plasma     ug/dL 2.4    He had significant fatigue, weakness, before starting hydrocortisone.  He was started on a higher dose, 20 mg a.m. and 10 mg in a.m. and he felt much better immediately afterwards.  We were able to subsequently decrease  the dose to 10 mg in a.m. and 5 mg in p.m. however, he did misunderstood instructions and was taking only 5 mg twice a day.  He did develop fatigue on this dose so we increased the dose back to 10 mg in a.m. and 5 mg in p.m..  He continues on this dose now.  No nausea, weakness, dizziness, headaches. He has fatigue and decreased appetite, improved after finishing RxTx.  Hyponatremia: -Developed in 02/2018 -Most likely secondary to uncontrolled central adrenal insufficiency and hypothyroidism -Resolved  Reviewed his sodium levels: Lab Results  Component Value Date   NA 136 07/09/2021   NA 140 06/11/2021   NA 136 06/03/2021   NA 140 04/22/2021   NA 141 03/28/2021   NA 141 03/27/2021   NA 137 03/26/2021   NA 138 03/25/2021   NA 142 10/16/2020   NA 141 04/09/2020   NA 139 12/27/2019   NA 143 08/03/2019   NA 145 04/05/2019   NA 144 12/30/2018   NA 140 08/30/2018   NA 142 05/17/2018   NA 137 04/08/2018   NA 135 03/28/2018   NA 135 03/27/2018   NA 129 (L) 03/26/2018   NA 124 (L) 03/25/2018   NA 123 (L) 03/25/2018   NA 120 (L) 03/25/2018   NA 117 (LL) 03/24/2018   NA 116 (LL) 03/24/2018   NA 115 (LL) 03/24/2018   NA 116 (L) 03/24/2018   NA 120 (L) 03/22/2018   NA 136 03/16/2018   NA 134 (L) 03/15/2018   He had lung CA in 1993-1994.  He quit smoking in 1990  ROS: + SEE hpi  I reviewed pt's medications, allergies, PMH, social hx, family hx, and changes were documented in the history of present illness. Otherwise, unchanged from my initial visit note.  Past Medical History:  Diagnosis Date   Arthritis    Chronic kidney disease    only has one kidney    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 02/01/2015   Coronary artery disease    followed by Dr.Tilley   GERD (gastroesophageal reflux disease)    Heart murmur    High cholesterol    Hx of cancer of lung 1980's   Hypertension    Incisional hernia 08/01/2015   Lung cancer ([place]) 1993   Lung  metastases ([place]) 2019   Recurrent umbilical hernia 06/03/4079   Right renal mass    Past Surgical History:  Procedure Laterality Date   APPENDECTOMY  age 39   CHOLECYSTECTOMY  31's   LAPAROSCOPIC LYSIS OF ADHESIONS N/A 08/01/2015   Procedure: LAPAROSCOPIC LYSIS OF ADHESIONS;  Surgeon: Michael Boston, MD;  Location: [facility];  Service: General;  Laterality: N/A;   LUNG LOBECTOMY  1992   lung cancer- patient has staples in lung  not to have MRI per patient    PILONIDAL CYST EXCISION  2011   Dr Harlow Asa   ROBOTIC ASSITED PARTIAL NEPHRECTOMY Right 02/01/2015   Procedure: RIGHT ROBOTIC ASSISTED LAPAROSOCOPY NEPHRECTOMY;  Surgeon: Ardis Hughs, MD;  Location: [facility];  Service: Urology;  Laterality: Right;   VENTRAL HERNIA REPAIR N/A 08/01/2015   Procedure: LAPAROSCOPIC VENTRAL WALL HERNIA WITH MESH;  Surgeon: Michael Boston, MD;  Location: [facility];  Service: General;  Laterality: N/A;   Social History   Socioeconomic History   Marital status: Widowed    Spouse name: Not on file   Number of children: Not on file   Years of education: Not on file   Highest education level: Not on file  Occupational History   Occupation: Retired  Tobacco Use   Smoking status: Former    Packs/day: 1.00    Years: 20.00    Pack years: 20.00    Types: Cigarettes    Quit date: 01/29/1988    Years since quitting: 33.5   Smokeless tobacco: Never  Substance and Sexual Activity   Alcohol use: Yes    Comment: rare   Drug use: No   Sexual activity: Not on file  Other Topics Concern   Not on file  Social History Narrative   Not on file   Social Determinants of Health   Financial Resource Strain: Not on file  Food Insecurity: Not on file  Transportation Needs: Not on file  Physical Activity: Not on file  Stress: Not on file  Social Connections: Not on file  Intimate Partner Violence: Not on file   Current Outpatient Medications on File Prior to Visit  Medication Sig Dispense Refill   acetaminophen  (TYLENOL) 325 MG tablet Take 2 tablets (650 mg total) by mouth every 6 (six) hours as needed for mild pain or headache (over the counter). (Patient taking differently: Take 650 mg by mouth every 6 (six) hours as needed for mild pain or headache.) 30 tablet 3   aspirin 81 MG chewable tablet Chew 81 mg by mouth daily.     benzonatate (TESSALON) 100 MG capsule Take 1 capsule (100 mg total) by mouth 3 (three) times daily as needed for cough. 20 capsule 0   cabozantinib (CABOMETYX) 20 MG tablet Take 1 tablet (20 mg total) by mouth daily. Take on an empty stomach, 1 hour before or 2 hours after meals. 30 tablet 1   Cholecalciferol (VITAMIN D3) 50 MCG (2000 UT) TABS Take 2,000 Units by mouth daily.     diltiazem (CARDIZEM CD) 120 MG 24 hr capsule Take 120 mg by mouth in the morning.     ELIQUIS 5 MG TABS tablet Take 5 mg by mouth 2 (two) times daily.     famotidine (PEPCID) 20 MG tablet Take 20 mg by mouth See admin instructions. Take 20 mg by mouth at 3 PM daily and an additional 20 mg once a day as needed for heartburn or indigestion     guaiFENesin-dextromethorphan (ROBITUSSIN DM) 100-10 MG/5ML syrup Take 5 mLs by mouth every 4 (four) hours as needed for cough. 118 mL 0   hydrocortisone (CORTEF) 10 MG tablet Take 5-10 mg by mouth See admin instructions. Take 10 mg by mouth in the morning and 5 mg in the afternoon     hydrocortisone (CORTEF) 10 MG tablet TAKE 1 TABLET BY MOUTH IN THE MORNING AND 1/2 TABLET IN THE EVENING AT 2 PM 150 tablet 0   levothyroxine (SYNTHROID) 150 MCG tablet  TAKE 1 TABLET BY MOUTH EVERY OTHER DAY (Patient taking differently: Take 150 mcg by mouth See admin instructions. Take 150 mcg by mouth every other morning before breakfast) 30 tablet 3   levothyroxine (SYNTHROID) 175 MCG tablet TAKE 1 TABLET BY MOUTH EVERY OTHER DAY (Patient taking differently: Take 175 mcg by mouth See admin instructions. Take 175 mcg by mouth every other morning before breakfast) 30 tablet 3   Multiple  Vitamins-Minerals (CENTRUM SILVER 50+MEN) TABS Take 1 tablet by mouth daily with breakfast.     temazepam (RESTORIL) 7.5 MG capsule Take 1 capsule (7.5 mg total) by mouth at bedtime as needed for sleep. 30 capsule 0   traZODone (DESYREL) 150 MG tablet Take 150 mg by mouth at bedtime as needed for sleep.     No current facility-administered medications on file prior to visit.   Allergies  Allergen Reactions   Benadryl [Diphenhydramine] Other (See Comments)    Severe restless legs   Family History  Problem Relation Age of Onset   Heart attack Father    Cardiologist: Dr. Sallyanne Kuster.  PE: BP 126/72 (BP Location: Right Arm, Patient Position: Sitting, Cuff Size: Normal)   Pulse 76   Ht 5\' 9"  (1.753 m)   Wt 176 lb 12.8 oz (80.2 kg)   SpO2 92%   BMI 26.11 kg/m  Wt Readings from Last 3 Encounters:  07/23/21 176 lb 12.8 oz (80.2 kg)  07/09/21 174 lb 14.4 oz (79.3 kg)  06/11/21 182 lb 14.4 oz (83 kg)   Constitutional: overweight, in NAD Eyes: EOMI, no exophthalmos ENT: moist mucous membranes, no masses palpated in neck, no cervical lymphadenopathy Cardiovascular: RRR, No MRG Respiratory: CTA B Musculoskeletal: no deformities Skin: moist, warm, no rashes Neurological: no tremor with outstretched hands  ASSESSMENT: 1. Primary Hypothyroidism  2. Central Adrenal Insufficiency  3. Hyponatremia -Low sodium: 115 during his admission from 03/2018, likely due to uncontrolled hypothyroidism and central adrenal insufficiency, which are not treated -Sodium normalized afterwards.  PLAN:  1. Patient with primary hypothyroidism, on levothyroxine -Likely secondary to ipilimumab -He was admitted in 03/2018 and the TSH was found to be 86.  He was initially given levothyroxine IV, then 50 mcg p.o. we subsequently increase the dose to 175 mcg daily in 01/2020, but afterwards we decreased the dose slightly. - latest thyroid labs reviewed with pt. >> normal: Lab Results  Component Value Date    TSH 1.651 03/25/2021  - he continues on LT4 150 mcg alternating with 175 mcg every other day.   - pt feels good on this dose.  She lost 6 pounds since last visit.  He mentions that this is related to having had a recent URI, during which he did not eat very well.  Appetite returned afterwards. - we discussed about taking the thyroid hormone every day, with water, >30 minutes before breakfast, separated by >4 hours from acid reflux medications, calcium, iron, multivitamins. Pt. is taking it correctly. - will check thyroid tests today: TSH and fT4 - If labs are abnormal, he will need to return for repeat TFTs in 1.5 months - will see him back in 1 year  2. Central Adrenal Insufficiency -Also likely secondary to ipilimumab -Since last visit, he did not have to double the dose of hydrocortisone or take it parenterally -he was previously on hydrocortisone 20 mg in a.m. and 10 mg in p.m. and I advised him to decrease the dose to a more physiologic 10 mg in a.m. and 5 mg in p.m.  At that time, he misunderstood instructions and was only taking 5 mg twice a day but afterwards especially during his radiation therapy, he started to feel more fatigued so he increased the dose to 10 mg in a.m. and 5 mg in p.m. he feels well on this dose.  He occasionally misses the 5 mg of hydrocortisone in the afternoon.  Mentions that he does feel more tired when he misses it. -He has a medical alert bracelet mentioning "adrenal insufficiency" -I reiterated sick day rules: If you cannot keep anything down, including your hydrocortisone medication, please go to the emergency room or your primary care physician office to get steroids injected in the muscle or vein.  If you have a fever (more than 100 Fahrenheit) or gastroenteritis with nausea/vomiting and diarrhea, please double the dose of your hydrocortisone for the duration of the fever or the gastroenteritis. Do not run out of your hydrocortisone medication.  Component      Latest Ref Rng 07/23/2021  TSH     0.35 - 5.50 uIU/mL 1.51   T4,Free(Direct)     0.60 - 1.60 ng/dL 1.30    TFTs are great.  Philemon Kingdom, MD PhD [facility]

## 2021-07-23 NOTE — Patient Instructions (Addendum)
Please continueHydrocortisone to 10 mg in am and 5 mg in the pm.  - You absolutely need to take this medication every day and not skip doses. - Please double the dose if you have a fever, for the duration of the fever. - If you cannot take anything by mouth (vomiting) or you have severe diarrhea so that you eliminate the hydrocortisone pills in your stool, please make sure that you get the hydrocortisone in the vein instead - go to the nearest emergency department/urgent care or you may go to your PCPs office  - Please try to get a MedAlert bracelet or pendant indicating: "Adrenal insufficiency".  Please continue: - Levothyroxine 175 mcg alternating with 150 mcg every other day.  Take the thyroid hormone every day, with water, at least 30 minutes before breakfast, separated by at least 4 hours from: - acid reflux medications - calcium - iron - multivitamins  Please stop at the lab.  Please come back for a follow-up appointment in 1 year.

## 2021-07-24 ENCOUNTER — Other Ambulatory Visit (HOSPITAL_COMMUNITY): Payer: Self-pay

## 2021-07-30 NOTE — Progress Notes (Unsigned)
Cardiology Office Note:    Date:  07/30/2021   ID:  [name], DOB Mar 03, 1945, MRN [id number]  PCP:  [name], MD  Cardiologist:  Sanda Klein, MD  Electrophysiologist:  None   Referring MD: [name], *   Chief Complaint: follow-up of CAD  History of Present Illness:    [name] is a 76 y.o. male with a history of multivessel CAD including subtotal CTO of mid RCA with collaterals noted on cardiac catheterization in 06/2008, pericardial effusion resolved on last Echo in 08/2018, frequent PVCs, hypertension, hyperlipidemia, adrenal insufficiency, hypothyroidism, GERD, recurrent umbilical hernia, lung cancer s/p right lower lobectomy in 02/1990, and metastatic renal cancer s/p right nephrectomy in 01/2015 who is followed by Dr. Sallyanne Kuster and presents today for routine follow-up.   Patient is a former patient of Dr. Wynonia Lawman and now follows with Dr. Sallyanne Kuster.  He has known CAD with cardiac catheterization in 06/2008 showing multivessel disease with a subtotal occlusion (95 to 99% stenosis) of the mid RCA with bridging collaterals and left-to-right collaterals, either occluded first diagonal branch for an ulcerated plaque in the proximal portion of the vessel prior to the septal perforator, 80% stenosis and intermediate branch of the LCx, and 50% stenosis of OM1. LVEF was 65-70% at that time.  Last ischemic evaluation was a nuclear stress test in 05/2017 which showed no evidence of ischemia.  Last Echo in 08/2018 showed LVEF of 60-65% with normal wall motion and grade 1 diastolic dysfunction and complete resolution of prior pericardial effusion.  Prior CT scans have shown extensive atherosclerosis in the coronaries, aorta, aortic arch, and abdominal and iliac vessels.  However, none of these are symptomatic.  He has not been on lipid-lowering therapy due to severe muscle weakness to statins.  Patient was last seen by Dr. Sallyanne Kuster in 12/2019 at which time he was doing well.  He has  a widely metastatic renal cancer ((involving the lung and liver, omentum, retrocaval lymph nodes) but was doing well with immunotherapy and radiation therapy.  Since last visit, he was admitted in 03/2021 after presenting with significant weakness and decline in general condition and appetite. He was noted to be febrile in the ED and was admitted SIRS and acute on CKD stage III. He was initially treated with antibiotics but urine culture and blood cultures were negative. He was treated with IV fluids. Of note, CT in the ED did showed progression of malignancy in the right upper retroperitoneal area with enlarged lymphadenopathy. Per last Oncology note, therapy is considered palliative although aggressive measures are warranted given reasonable performance status.   Patient presents today for follow-up. ***  *** ASK ABOUT ELIQUIS - WHAT IS IT FOR? ***  CAD Multivessel CAD noted on cardiac catheterization in 2010 including subtotal occlusion of mid RCA with collaterals. Myoview in 2018 showed no ischemia.  - No chest pain.  - Continue Aspirin 81mg  daily.  - Intolerant to statins.   Frequent PVCs *** - Continue Cardizem CD 120mg  daily.   Hypertension BP well controlled. - Continue Cardizem CD 120mg  daily.  Hyperlipidemia Most recent lipid panel in ***: - Intolerant to statin in the past due to severe muscle weakness. - Decision has been made to be less aggressive with lipid lowering therapy since his long term oncology prognosis appeared to be poor.   Past Medical History:  Diagnosis Date   Arthritis    Chronic kidney disease    only has one kidney    Clear cell renal  cell carcinoma s/p robotic nephrectomy Dec 2016 02/01/2015   Coronary artery disease    followed by Dr.Tilley   GERD (gastroesophageal reflux disease)    Heart murmur    High cholesterol    Hx of cancer of lung 1980's   Hypertension    Incisional hernia 08/01/2015   Lung cancer ([place]) 1993   Lung metastases 2019    Recurrent umbilical hernia 10/04/2991   Right renal mass     Past Surgical History:  Procedure Laterality Date   APPENDECTOMY  age 18   CHOLECYSTECTOMY  14's   LAPAROSCOPIC LYSIS OF ADHESIONS N/A 08/01/2015   Procedure: LAPAROSCOPIC LYSIS OF ADHESIONS;  Surgeon: Michael Boston, MD;  Location: [facility];  Service: General;  Laterality: N/A;   LUNG LOBECTOMY  1992   lung cancer- patient has staples in lung not to have MRI per patient    PILONIDAL CYST EXCISION  2011   Dr Harlow Asa   ROBOTIC ASSITED PARTIAL NEPHRECTOMY Right 02/01/2015   Procedure: RIGHT ROBOTIC ASSISTED LAPAROSOCOPY NEPHRECTOMY;  Surgeon: Ardis Hughs, MD;  Location: [facility];  Service: Urology;  Laterality: Right;   VENTRAL HERNIA REPAIR N/A 08/01/2015   Procedure: LAPAROSCOPIC VENTRAL WALL HERNIA WITH MESH;  Surgeon: Michael Boston, MD;  Location: [facility];  Service: General;  Laterality: N/A;    Current Medications: No outpatient medications have been marked as taking for the 08/11/21 encounter (Appointment) with Darreld Mclean, PA-C.     Allergies:   Benadryl [diphenhydramine]   Social History   Socioeconomic History   Marital status: Widowed    Spouse name: Not on file   Number of children: Not on file   Years of education: Not on file   Highest education level: Not on file  Occupational History   Occupation: Retired  Tobacco Use   Smoking status: Former    Packs/day: 1.00    Years: 20.00    Total pack years: 20.00    Types: Cigarettes    Quit date: 01/29/1988    Years since quitting: 33.5   Smokeless tobacco: Never  Substance and Sexual Activity   Alcohol use: Yes    Comment: rare   Drug use: No   Sexual activity: Not on file  Other Topics Concern   Not on file  Social History Narrative   Not on file   Social Determinants of Health   Financial Resource Strain: Not on file  Food Insecurity: Not on file  Transportation Needs: Not on file  Physical Activity: Not on file  Stress: Not on file   Social Connections: Not on file     Family History: The patient's family history includes Heart attack in his father.  ROS:   Please see the history of present illness.      EKGs/Labs/Other Studies Reviewed:    The following studies were reviewed:  Cardiac Catheterization 06/27/2008: ANGIOGRAPHIC DATA:  Left ventriculogram:  Performed in the 30 degrees RAO projection.  The aortic valve is normal.  The mitral valve is normal.  The left ventricle appears normal in size.  Estimated ejection fraction is 65-70%.  Coronary arteries arise and distribute normally. There is calcification noted involving all three vessels.  It is mild to moderate in nature.  Left main coronary artery is normal.  Left anterior descending has either an occluded first diagonal branch or an ulcerated plaque in the proximal portion of the vessel prior to the septal perforator.  Very little in the way of collaterals is seen.  There  is a mild diffuse disease present and a 30% mid stenosis is noted.   CIRCUMFLEX CORONARY ARTERY:  An intermediate branch has a long segmental 80% stenosis and is somewhat small in portion.  The circumflex has a first marginal branch which has a segmental 50% stenosis which is segmental in its proximal portion.  Collateral filling is seen of the distal right coronary artery with bidirectional flow noted.  The right coronary artery is calcified.  There is a long severe segmental stenosis of 95-99% in the proximal to the midportion of the vessel with was some evidence of bridging collaterals, but forward antegrade flow was noted.   IMPRESSION:  Coronary artery disease with subtotal occlusion of the right coronary artery with bridging collaterals and left-to-right collaterals, diffuse disease involving the proximal LAD, first marginal branch intermediate branch. _______________  Echocardiogram 09/06/2018: Impressions: 1. The left ventricle has normal systolic function, with an ejection   fraction of 60-65%. The cavity size was normal. There is mildly increased  left ventricular wall thickness. Left ventricular diastolic Doppler  parameters are consistent with impaired  relaxation. No evidence of left ventricular regional wall motion  abnormalities.   2. The right ventricle has normal systolc function. The cavity was  normal. There is no increase in right ventricular wall thickness.   3. There is mild mitral annular calcification present.   4. The aortic valve is tricuspid.   5. Pulmonic valve regurgitation was not assessed by color flow Doppler.   6. The aortic root is normal in size and structure.   7. No significant pericardial effusion.  EKG:  EKG  ordered today. EKG personally reviewed and demonstrates ***.  Recent Labs: 03/28/2021: Magnesium 2.0 07/09/2021: ALT 39; BUN 25; Creatinine 1.80; Hemoglobin 12.9; Platelet Count 151; Potassium 4.5; Sodium 136 07/23/2021: TSH 1.51  Recent Lipid Panel No results found for: "CHOL", "TRIG", "HDL", "CHOLHDL", "VLDL", "LDLCALC", "LDLDIRECT"  Physical Exam:    Vital Signs: There were no vitals taken for this visit.    Wt Readings from Last 3 Encounters:  07/23/21 176 lb 12.8 oz (80.2 kg)  07/09/21 174 lb 14.4 oz (79.3 kg)  06/11/21 182 lb 14.4 oz (83 kg)     General: 76 y.o. male in no acute distress. HEENT: Normocephalic and atraumatic. Sclera clear. EOMs intact. Neck: Supple. No carotid bruits. No JVD. Heart: *** RRR. Distinct S1 and S2. No murmurs, gallops, or rubs. Radial and distal pedal pulses 2+ and equal bilaterally. Lungs: No increased work of breathing. Clear to ausculation bilaterally. No wheezes, rhonchi, or rales.  Abdomen: Soft, non-distended, and non-tender to palpation. Bowel sounds present in all 4 quadrants.  MSK: Normal strength and tone for age. *** Extremities: No lower extremity edema.    Skin: Warm and dry. Neuro: Alert and oriented x3. No focal deficits. Psych: Normal affect. Responds  appropriately.   Assessment:    No diagnosis found.  Plan:     Disposition: Follow up in ***   Medication Adjustments/Labs and Tests Ordered: Current medicines are reviewed at length with the patient today.  Concerns regarding medicines are outlined above.  No orders of the defined types were placed in this encounter.  No orders of the defined types were placed in this encounter.   There are no Patient Instructions on file for this visit.   Signed, Darreld Mclean, PA-C  07/30/2021 2:38 PM    [facility]

## 2021-08-06 DIAGNOSIS — H6992 Unspecified Eustachian tube disorder, left ear: Secondary | ICD-10-CM | POA: Diagnosis not present

## 2021-08-10 ENCOUNTER — Encounter: Payer: Self-pay | Admitting: Student

## 2021-08-11 ENCOUNTER — Ambulatory Visit: Payer: Medicare HMO | Admitting: Student

## 2021-08-11 ENCOUNTER — Encounter: Payer: Self-pay | Admitting: Student

## 2021-08-11 VITALS — BP 130/80 | HR 65 | Ht 68.0 in | Wt 178.0 lb

## 2021-08-11 DIAGNOSIS — I2510 Atherosclerotic heart disease of native coronary artery without angina pectoris: Secondary | ICD-10-CM | POA: Diagnosis not present

## 2021-08-11 DIAGNOSIS — E785 Hyperlipidemia, unspecified: Secondary | ICD-10-CM | POA: Diagnosis not present

## 2021-08-11 DIAGNOSIS — I10 Essential (primary) hypertension: Secondary | ICD-10-CM | POA: Diagnosis not present

## 2021-08-11 DIAGNOSIS — R0989 Other specified symptoms and signs involving the circulatory and respiratory systems: Secondary | ICD-10-CM | POA: Diagnosis not present

## 2021-08-11 DIAGNOSIS — N183 Chronic kidney disease, stage 3 unspecified: Secondary | ICD-10-CM

## 2021-08-11 DIAGNOSIS — I493 Ventricular premature depolarization: Secondary | ICD-10-CM

## 2021-08-11 MED ORDER — ELIQUIS 5 MG PO TABS: 5.0000 mg | ORAL_TABLET | Freq: Two times a day (BID) | ORAL | 2 refills | Status: DC

## 2021-08-12 ENCOUNTER — Other Ambulatory Visit: Payer: Self-pay | Admitting: Student

## 2021-08-12 DIAGNOSIS — R0989 Other specified symptoms and signs involving the circulatory and respiratory systems: Secondary | ICD-10-CM

## 2021-08-12 DIAGNOSIS — E785 Hyperlipidemia, unspecified: Secondary | ICD-10-CM

## 2021-08-12 DIAGNOSIS — I10 Essential (primary) hypertension: Secondary | ICD-10-CM

## 2021-08-12 DIAGNOSIS — I493 Ventricular premature depolarization: Secondary | ICD-10-CM

## 2021-08-12 LAB — BASIC METABOLIC PANEL
BUN/Creatinine Ratio: 14 (ref 10–24)
BUN: 14 mg/dL (ref 8–27)
CO2: 21 mmol/L (ref 20–29)
Calcium: 8.4 mg/dL — ABNORMAL LOW (ref 8.6–10.2)
Chloride: 103 mmol/L (ref 96–106)
Creatinine, Ser: 1.02 mg/dL (ref 0.76–1.27)
Glucose: 93 mg/dL (ref 70–99)
Potassium: 4.3 mmol/L (ref 3.5–5.2)
Sodium: 141 mmol/L (ref 134–144)
eGFR: 75 mL/min/{1.73_m2} (ref >59)

## 2021-08-14 ENCOUNTER — Ambulatory Visit (HOSPITAL_COMMUNITY)
Admission: RE | Admit: 2021-08-14 | Discharge: 2021-08-14 | Disposition: A | Discharge status: Home / Self Care | Payer: Medicare HMO | Source: Ambulatory Visit | Attending: Student | Admitting: Student

## 2021-08-14 DIAGNOSIS — I493 Ventricular premature depolarization: Secondary | ICD-10-CM | POA: Diagnosis not present

## 2021-08-14 DIAGNOSIS — I10 Essential (primary) hypertension: Secondary | ICD-10-CM | POA: Diagnosis not present

## 2021-08-14 DIAGNOSIS — R0989 Other specified symptoms and signs involving the circulatory and respiratory systems: Secondary | ICD-10-CM | POA: Insufficient documentation

## 2021-08-14 DIAGNOSIS — E785 Hyperlipidemia, unspecified: Secondary | ICD-10-CM | POA: Insufficient documentation

## 2021-08-18 ENCOUNTER — Other Ambulatory Visit (HOSPITAL_COMMUNITY): Payer: Self-pay

## 2021-08-25 ENCOUNTER — Other Ambulatory Visit: Payer: Self-pay | Admitting: Oncology

## 2021-08-25 ENCOUNTER — Other Ambulatory Visit (HOSPITAL_COMMUNITY): Payer: Self-pay

## 2021-08-25 MED ORDER — CABOMETYX 20 MG PO TABS
20.0000 mg | ORAL_TABLET | Freq: Every day | ORAL | 1 refills | Status: DC
  Filled 2021-08-25: qty 30, 30d supply, fill #0
  Filled 2021-09-18: qty 30, 30d supply, fill #1

## 2021-08-27 ENCOUNTER — Other Ambulatory Visit (HOSPITAL_COMMUNITY): Payer: Self-pay

## 2021-08-27 DIAGNOSIS — L728 Other follicular cysts of the skin and subcutaneous tissue: Secondary | ICD-10-CM | POA: Diagnosis not present

## 2021-08-27 DIAGNOSIS — L814 Other melanin hyperpigmentation: Secondary | ICD-10-CM | POA: Diagnosis not present

## 2021-08-27 DIAGNOSIS — L578 Other skin changes due to chronic exposure to nonionizing radiation: Secondary | ICD-10-CM | POA: Diagnosis not present

## 2021-08-27 DIAGNOSIS — L570 Actinic keratosis: Secondary | ICD-10-CM | POA: Diagnosis not present

## 2021-08-28 ENCOUNTER — Inpatient Hospital Stay: Payer: Medicare HMO | Admitting: Oncology

## 2021-08-28 ENCOUNTER — Inpatient Hospital Stay: Payer: Medicare HMO | Attending: Oncology

## 2021-08-28 ENCOUNTER — Other Ambulatory Visit: Payer: Self-pay

## 2021-08-28 VITALS — BP 136/73 | HR 72 | Temp 97.9°F | Resp 16 | Ht 68.0 in | Wt 176.6 lb

## 2021-08-28 DIAGNOSIS — C787 Secondary malignant neoplasm of liver and intrahepatic bile duct: Secondary | ICD-10-CM | POA: Diagnosis not present

## 2021-08-28 DIAGNOSIS — C641 Malignant neoplasm of right kidney, except renal pelvis: Secondary | ICD-10-CM

## 2021-08-28 DIAGNOSIS — R197 Diarrhea, unspecified: Secondary | ICD-10-CM | POA: Insufficient documentation

## 2021-08-28 DIAGNOSIS — Z905 Acquired absence of kidney: Secondary | ICD-10-CM | POA: Insufficient documentation

## 2021-08-28 DIAGNOSIS — N19 Unspecified kidney failure: Secondary | ICD-10-CM | POA: Insufficient documentation

## 2021-08-28 DIAGNOSIS — C649 Malignant neoplasm of unspecified kidney, except renal pelvis: Secondary | ICD-10-CM | POA: Insufficient documentation

## 2021-08-28 DIAGNOSIS — C786 Secondary malignant neoplasm of retroperitoneum and peritoneum: Secondary | ICD-10-CM | POA: Insufficient documentation

## 2021-08-28 DIAGNOSIS — C7800 Secondary malignant neoplasm of unspecified lung: Secondary | ICD-10-CM | POA: Diagnosis not present

## 2021-08-28 LAB — CBC WITH DIFFERENTIAL (CANCER CENTER ONLY)
Abs Immature Granulocytes: 0.01 10*3/uL (ref 0.00–0.07)
Basophils Absolute: 0 10*3/uL (ref 0.0–0.1)
Basophils Relative: 1 %
Eosinophils Absolute: 0.2 10*3/uL (ref 0.0–0.5)
Eosinophils Relative: 3 %
HCT: 37 % — ABNORMAL LOW (ref 39.0–52.0)
Hemoglobin: 12.6 g/dL — ABNORMAL LOW (ref 13.0–17.0)
Immature Granulocytes: 0 %
Lymphocytes Relative: 43 %
Lymphs Abs: 2.2 10*3/uL (ref 0.7–4.0)
MCH: 31.7 pg (ref 26.0–34.0)
MCHC: 34.1 g/dL (ref 30.0–36.0)
MCV: 93.2 fL (ref 80.0–100.0)
Monocytes Absolute: 0.5 10*3/uL (ref 0.1–1.0)
Monocytes Relative: 10 %
Neutro Abs: 2.2 10*3/uL (ref 1.7–7.7)
Neutrophils Relative %: 43 %
Platelet Count: 203 10*3/uL (ref 150–400)
RBC: 3.97 MIL/uL — ABNORMAL LOW (ref 4.22–5.81)
RDW: 15.1 % (ref 11.5–15.5)
WBC Count: 5.1 10*3/uL (ref 4.0–10.5)
nRBC: 0 % (ref 0.0–0.2)

## 2021-08-28 LAB — CMP (CANCER CENTER ONLY)
ALT: 21 U/L (ref 0–44)
AST: 21 U/L (ref 15–41)
Albumin: 3.8 g/dL (ref 3.5–5.0)
Alkaline Phosphatase: 54 U/L (ref 38–126)
Anion gap: 5 (ref 5–15)
BUN: 13 mg/dL (ref 8–23)
CO2: 29 mmol/L (ref 22–32)
Calcium: 8.7 mg/dL — ABNORMAL LOW (ref 8.9–10.3)
Chloride: 106 mmol/L (ref 98–111)
Creatinine: 1.34 mg/dL — ABNORMAL HIGH (ref 0.61–1.24)
GFR, Estimated: 54 mL/min — ABNORMAL LOW (ref >60)
Glucose, Bld: 92 mg/dL (ref 70–99)
Potassium: 4.2 mmol/L (ref 3.5–5.1)
Sodium: 140 mmol/L (ref 135–145)
Total Bilirubin: 0.4 mg/dL (ref 0.3–1.2)
Total Protein: 6.6 g/dL (ref 6.5–8.1)

## 2021-08-28 NOTE — Progress Notes (Signed)
Hematology and Oncology Follow Up Visit  [name] [id number] Mar 09, 1945 76 y.o. 08/28/2021 8:25 AM [name], MDElkins, [name], *   Principle Diagnosis: 76 year old man with stage IV clear-cell renal cell carcinoma with hepatic and omental involvement  diagnosed in 2019.    Prior Therapy: He underwent a radical nephrectomy completed by Dr. Louis Perry on February 01, 2015.  The final pathology revealed a 3.3 cm clear cell renal cell carcinoma with Fuhrman grade 3.  Margins were negative indicating stage T1 a disease  He is status post omental biopsy in July 2019 which confirmed the presence of recurrent metastatic renal cell carcinoma.  Nivolumab at 3 mg/mg and ipilimumab at 1 mg/kg cycle 1 started on 09/21/2017.  He completed 4 cycles of therapy.   He did not receive any additional immunotherapy after developing adrenal insufficiency and panhypopituitarism.  He is status post radiation therapy to abdominal lymph node completed on November 08, 2019.  He received total of 30 Gy in 10 fractions  Current therapy: Cabometyx 40 mg daily started in March 2023.  Interim History: [name] returns today for repeat evaluation.  Since last visit, he reports no major changes in his health.  He continues to tolerate Cabometyx without any recent complaints.  He does report loose bowel habits at times but no diarrhea.  He denies any nausea, vomiting or abdominal pain.  He denies any weight loss or appetite changes.  His performance status quality of life remains unchanged.  Continues to live independently and attends to activities of daily living.           .      Medications: Reviewed without changes. Current Outpatient Medications  Medication Sig Dispense Refill   acetaminophen (TYLENOL) 325 MG tablet Take 2 tablets (650 mg total) by mouth every 6 (six) hours as needed for mild pain or headache (over the counter). (Patient taking differently: Take 650 mg by mouth every 6  (six) hours as needed for mild pain or headache.) 30 tablet 3   benzonatate (TESSALON) 100 MG capsule Take 1 capsule (100 mg total) by mouth 3 (three) times daily as needed for cough. 20 capsule 0   cabozantinib (CABOMETYX) 20 MG tablet Take 1 tablet (20 mg total) by mouth daily. Take on an empty stomach, 1 hour before or 2 hours after meals. 30 tablet 1   Cholecalciferol (VITAMIN D3) 50 MCG (2000 UT) TABS Take 2,000 Units by mouth daily.     diltiazem (CARDIZEM CD) 120 MG 24 hr capsule Take 120 mg by mouth in the morning.     ELIQUIS 5 MG TABS tablet Take 1 tablet (5 mg total) by mouth 2 (two) times daily. 60 tablet 2   famotidine (PEPCID) 20 MG tablet Take 20 mg by mouth See admin instructions. Take 20 mg by mouth at 3 PM daily and an additional 20 mg once a day as needed for heartburn or indigestion     guaiFENesin-dextromethorphan (ROBITUSSIN DM) 100-10 MG/5ML syrup Take 5 mLs by mouth every 4 (four) hours as needed for cough. 118 mL 0   hydrocortisone (CORTEF) 10 MG tablet Please take 10 mg in am and 5 mg in PM. Please allow for EMG dosing 160 tablet 3   levothyroxine (SYNTHROID) 150 MCG tablet TAKE 1 TABLET BY MOUTH EVERY OTHER DAY (Patient taking differently: Take 150 mcg by mouth See admin instructions. Take 150 mcg by mouth every other morning before breakfast) 30 tablet 3   levothyroxine (SYNTHROID) 175 MCG tablet  TAKE 1 TABLET BY MOUTH EVERY OTHER DAY (Patient taking differently: Take 175 mcg by mouth See admin instructions. Take 175 mcg by mouth every other morning before breakfast) 30 tablet 3   Multiple Vitamins-Minerals (CENTRUM SILVER 50+MEN) TABS Take 1 tablet by mouth daily with breakfast.     temazepam (RESTORIL) 7.5 MG capsule Take 1 capsule (7.5 mg total) by mouth at bedtime as needed for sleep. 30 capsule 0   traZODone (DESYREL) 150 MG tablet Take 150 mg by mouth at bedtime as needed for sleep.     No current facility-administered medications for this visit.     Allergies:   Allergies  Allergen Reactions   Benadryl [Diphenhydramine] Other (See Comments)    Severe restless legs        Physical Exam:        Blood pressure 136/73, pulse 72, temperature 97.9 F (36.6 C), temperature source Temporal, resp. rate 16, height 5\' 8"  (1.727 m), weight 176 lb 9.6 oz (80.1 kg), SpO2 98 %.       ECOG: 1     General appearance: Alert, awake without any distress. Head: Atraumatic without abnormalities Oropharynx: Without any thrush or ulcers. Eyes: No scleral icterus. Lymph nodes: No lymphadenopathy noted in the cervical, supraclavicular, or axillary nodes Heart:regular rate and rhythm, without any murmurs or gallops.   Lung: Clear to auscultation without any rhonchi, wheezes or dullness to percussion. Abdomin: Soft, nontender without any shifting dullness or ascites. Musculoskeletal: No clubbing or cyanosis. Neurological: No motor or sensory deficits. Skin: No rashes or lesions.                 Lab Results: Lab Results  Component Value Date   WBC 5.1 08/28/2021   HGB 12.6 (L) 08/28/2021   HCT 37.0 (L) 08/28/2021   MCV 93.2 08/28/2021   PLT 203 08/28/2021     Chemistry      Component Value Date/Time   NA 141 08/11/2021 1252   K 4.3 08/11/2021 1252   CL 103 08/11/2021 1252   CO2 21 08/11/2021 1252   BUN 14 08/11/2021 1252   CREATININE 1.02 08/11/2021 1252   CREATININE 1.80 (H) 07/09/2021 1108      Component Value Date/Time   CALCIUM 8.4 (L) 08/11/2021 1252   ALKPHOS 64 07/09/2021 1108   AST 33 07/09/2021 1108   ALT 39 07/09/2021 1108   BILITOT 0.5 07/09/2021 1108         Impression and Plan:  76 year old man with:   1.  Kidney cancer diagnosed in 2019.  He was found to have stage IV clear-cell renal cell carcinoma with hepatic and peritoneal metastasis.   He is currently on Cabometyx without any major complaints.  Risks and benefits of continuing this treatment were discussed at this time.  Complications  including GI toxicity and includes diarrhea, weight loss and nausea were reiterated.  Plan is to update his staging scan before the next visit.    2.  Immune mediated complications: He experienced a significant autoimmune complications including hypophysitis and currently on hormone replacement.  3.  Renal failure: His kidney function on August 11, 2021 was within normal range.  4.  Prognosis and goals of care: His disease is incurable although aggressive measures are warranted given his excellent performance status.  5.  Follow-up: He will return in the next 4 to 5 weeks for repeat imaging studies.   30  minutes were dedicated to this encounter.  Time was spent on reviewing laboratory data,  disease status update outlining future plan of care review.   Zola Button, MD 7/13/20238:25 AM

## 2021-08-29 ENCOUNTER — Telehealth: Payer: Self-pay | Admitting: Oncology

## 2021-08-29 NOTE — Telephone Encounter (Signed)
Scheduled per 07/14 los, patient has been called and notified.

## 2021-09-18 ENCOUNTER — Other Ambulatory Visit (HOSPITAL_COMMUNITY): Payer: Self-pay

## 2021-09-22 ENCOUNTER — Other Ambulatory Visit (HOSPITAL_COMMUNITY): Payer: Self-pay

## 2021-09-24 ENCOUNTER — Other Ambulatory Visit: Payer: Medicare HMO

## 2021-09-26 ENCOUNTER — Ambulatory Visit (HOSPITAL_COMMUNITY)
Admission: RE | Admit: 2021-09-26 | Discharge: 2021-09-26 | Disposition: A | Discharge status: Home / Self Care | Payer: Medicare HMO | Source: Ambulatory Visit | Attending: Oncology | Admitting: Oncology

## 2021-09-26 ENCOUNTER — Inpatient Hospital Stay: Payer: Medicare HMO | Attending: Oncology

## 2021-09-26 ENCOUNTER — Other Ambulatory Visit: Payer: Self-pay

## 2021-09-26 ENCOUNTER — Other Ambulatory Visit: Payer: Medicare HMO

## 2021-09-26 DIAGNOSIS — N200 Calculus of kidney: Secondary | ICD-10-CM | POA: Diagnosis not present

## 2021-09-26 DIAGNOSIS — Z905 Acquired absence of kidney: Secondary | ICD-10-CM | POA: Insufficient documentation

## 2021-09-26 DIAGNOSIS — C786 Secondary malignant neoplasm of retroperitoneum and peritoneum: Secondary | ICD-10-CM | POA: Insufficient documentation

## 2021-09-26 DIAGNOSIS — E230 Hypopituitarism: Secondary | ICD-10-CM | POA: Diagnosis not present

## 2021-09-26 DIAGNOSIS — N19 Unspecified kidney failure: Secondary | ICD-10-CM | POA: Insufficient documentation

## 2021-09-26 DIAGNOSIS — C787 Secondary malignant neoplasm of liver and intrahepatic bile duct: Secondary | ICD-10-CM | POA: Diagnosis not present

## 2021-09-26 DIAGNOSIS — J9809 Other diseases of bronchus, not elsewhere classified: Secondary | ICD-10-CM | POA: Diagnosis not present

## 2021-09-26 DIAGNOSIS — C7900 Secondary malignant neoplasm of unspecified kidney and renal pelvis: Secondary | ICD-10-CM | POA: Diagnosis not present

## 2021-09-26 DIAGNOSIS — C641 Malignant neoplasm of right kidney, except renal pelvis: Secondary | ICD-10-CM

## 2021-09-26 DIAGNOSIS — C801 Malignant (primary) neoplasm, unspecified: Secondary | ICD-10-CM | POA: Diagnosis not present

## 2021-09-26 DIAGNOSIS — C649 Malignant neoplasm of unspecified kidney, except renal pelvis: Secondary | ICD-10-CM | POA: Insufficient documentation

## 2021-09-26 LAB — CMP (CANCER CENTER ONLY)
ALT: 25 U/L (ref 0–44)
AST: 23 U/L (ref 15–41)
Albumin: 3.9 g/dL (ref 3.5–5.0)
Alkaline Phosphatase: 57 U/L (ref 38–126)
Anion gap: 4 — ABNORMAL LOW (ref 5–15)
BUN: 15 mg/dL (ref 8–23)
CO2: 32 mmol/L (ref 22–32)
Calcium: 8.8 mg/dL — ABNORMAL LOW (ref 8.9–10.3)
Chloride: 104 mmol/L (ref 98–111)
Creatinine: 1.42 mg/dL — ABNORMAL HIGH (ref 0.61–1.24)
GFR, Estimated: 51 mL/min — ABNORMAL LOW (ref >60)
Glucose, Bld: 92 mg/dL (ref 70–99)
Potassium: 4.1 mmol/L (ref 3.5–5.1)
Sodium: 140 mmol/L (ref 135–145)
Total Bilirubin: 0.4 mg/dL (ref 0.3–1.2)
Total Protein: 6.8 g/dL (ref 6.5–8.1)

## 2021-09-26 LAB — CBC WITH DIFFERENTIAL (CANCER CENTER ONLY)
Abs Immature Granulocytes: 0.01 10*3/uL (ref 0.00–0.07)
Basophils Absolute: 0 10*3/uL (ref 0.0–0.1)
Basophils Relative: 0 %
Eosinophils Absolute: 0.3 10*3/uL (ref 0.0–0.5)
Eosinophils Relative: 4 %
HCT: 36.9 % — ABNORMAL LOW (ref 39.0–52.0)
Hemoglobin: 12.7 g/dL — ABNORMAL LOW (ref 13.0–17.0)
Immature Granulocytes: 0 %
Lymphocytes Relative: 42 %
Lymphs Abs: 2.5 10*3/uL (ref 0.7–4.0)
MCH: 32.4 pg (ref 26.0–34.0)
MCHC: 34.4 g/dL (ref 30.0–36.0)
MCV: 94.1 fL (ref 80.0–100.0)
Monocytes Absolute: 0.5 10*3/uL (ref 0.1–1.0)
Monocytes Relative: 9 %
Neutro Abs: 2.6 10*3/uL (ref 1.7–7.7)
Neutrophils Relative %: 45 %
Platelet Count: 198 10*3/uL (ref 150–400)
RBC: 3.92 MIL/uL — ABNORMAL LOW (ref 4.22–5.81)
RDW: 14.1 % (ref 11.5–15.5)
WBC Count: 5.9 10*3/uL (ref 4.0–10.5)
nRBC: 0 % (ref 0.0–0.2)

## 2021-10-01 ENCOUNTER — Inpatient Hospital Stay: Payer: Medicare HMO | Admitting: Oncology

## 2021-10-01 ENCOUNTER — Other Ambulatory Visit: Payer: Self-pay

## 2021-10-01 VITALS — BP 141/76 | HR 71 | Temp 97.7°F | Resp 15 | Ht 68.0 in | Wt 177.2 lb

## 2021-10-01 DIAGNOSIS — N19 Unspecified kidney failure: Secondary | ICD-10-CM | POA: Diagnosis not present

## 2021-10-01 DIAGNOSIS — C787 Secondary malignant neoplasm of liver and intrahepatic bile duct: Secondary | ICD-10-CM | POA: Diagnosis not present

## 2021-10-01 DIAGNOSIS — C649 Malignant neoplasm of unspecified kidney, except renal pelvis: Secondary | ICD-10-CM | POA: Diagnosis not present

## 2021-10-01 DIAGNOSIS — C786 Secondary malignant neoplasm of retroperitoneum and peritoneum: Secondary | ICD-10-CM | POA: Diagnosis not present

## 2021-10-01 DIAGNOSIS — C641 Malignant neoplasm of right kidney, except renal pelvis: Secondary | ICD-10-CM

## 2021-10-01 DIAGNOSIS — Z905 Acquired absence of kidney: Secondary | ICD-10-CM | POA: Diagnosis not present

## 2021-10-01 DIAGNOSIS — E230 Hypopituitarism: Secondary | ICD-10-CM | POA: Diagnosis not present

## 2021-10-01 NOTE — Progress Notes (Signed)
Hematology and Oncology Follow Up Visit  [name] [id number] 06/29/1945 76 y.o. 10/01/2021 11:48 AM [name], MDElkins, [name], *   Principle Diagnosis: 76 year old man with kidney cancer diagnosed in 2016.  He developed stage IV clear-cell renal cell carcinoma with hepatic and omental involvement  in 2019.    Prior Therapy: He underwent a radical nephrectomy completed by Dr. Louis Perry on February 01, 2015.  The final pathology revealed a 3.3 cm clear cell renal cell carcinoma with Fuhrman grade 3.  Margins were negative indicating stage T1 a disease  He is status post omental biopsy in July 2019 which confirmed the presence of recurrent metastatic renal cell carcinoma.  Nivolumab at 3 mg/mg and ipilimumab at 1 mg/kg cycle 1 started on 09/21/2017.  He completed 4 cycles of therapy.   He did not receive any additional immunotherapy after developing adrenal insufficiency and panhypopituitarism.  He is status post radiation therapy to abdominal lymph node completed on November 08, 2019.  He received total of 30 Gy in 10 fractions  Current therapy: Cabometyx 40 mg daily started in March 2023.  Interim History: [name] returns today for a follow-up.  Since last visit, he reports no major changes in his health.  He continues to tolerate Cabometyx without any recent complications.  He denies any nausea, vomiting or abdominal pain.  He has reported loose bowel habits and bloating but for the most part manageable.           .      Medications: Updated on review. Current Outpatient Medications  Medication Sig Dispense Refill   acetaminophen (TYLENOL) 325 MG tablet Take 2 tablets (650 mg total) by mouth every 6 (six) hours as needed for mild pain or headache (over the counter). (Patient taking differently: Take 650 mg by mouth every 6 (six) hours as needed for mild pain or headache.) 30 tablet 3   benzonatate (TESSALON) 100 MG capsule Take 1 capsule (100 mg total) by  mouth 3 (three) times daily as needed for cough. 20 capsule 0   cabozantinib (CABOMETYX) 20 MG tablet Take 1 tablet (20 mg total) by mouth daily. Take on an empty stomach, 1 hour before or 2 hours after meals. 30 tablet 1   Cholecalciferol (VITAMIN D3) 50 MCG (2000 UT) TABS Take 2,000 Units by mouth daily.     diltiazem (CARDIZEM CD) 120 MG 24 hr capsule Take 120 mg by mouth in the morning.     ELIQUIS 5 MG TABS tablet Take 1 tablet (5 mg total) by mouth 2 (two) times daily. 60 tablet 2   famotidine (PEPCID) 20 MG tablet Take 20 mg by mouth See admin instructions. Take 20 mg by mouth at 3 PM daily and an additional 20 mg once a day as needed for heartburn or indigestion     guaiFENesin-dextromethorphan (ROBITUSSIN DM) 100-10 MG/5ML syrup Take 5 mLs by mouth every 4 (four) hours as needed for cough. 118 mL 0   hydrocortisone (CORTEF) 10 MG tablet Please take 10 mg in am and 5 mg in PM. Please allow for EMG dosing 160 tablet 3   levothyroxine (SYNTHROID) 150 MCG tablet TAKE 1 TABLET BY MOUTH EVERY OTHER DAY (Patient taking differently: Take 150 mcg by mouth See admin instructions. Take 150 mcg by mouth every other morning before breakfast) 30 tablet 3   levothyroxine (SYNTHROID) 175 MCG tablet TAKE 1 TABLET BY MOUTH EVERY OTHER DAY (Patient taking differently: Take 175 mcg by mouth See admin instructions. Take  175 mcg by mouth every other morning before breakfast) 30 tablet 3   Multiple Vitamins-Minerals (CENTRUM SILVER 50+MEN) TABS Take 1 tablet by mouth daily with breakfast.     temazepam (RESTORIL) 7.5 MG capsule Take 1 capsule (7.5 mg total) by mouth at bedtime as needed for sleep. 30 capsule 0   traZODone (DESYREL) 150 MG tablet Take 150 mg by mouth at bedtime as needed for sleep.     No current facility-administered medications for this visit.     Allergies:  Allergies  Allergen Reactions   Benadryl [Diphenhydramine] Other (See Comments)    Severe restless legs        Physical  Exam:        Blood pressure (!) 141/76, pulse 71, temperature 97.7 F (36.5 C), temperature source Oral, resp. rate 15, height 5\' 8"  (1.727 m), weight 177 lb 3.2 oz (80.4 kg), SpO2 95 %.        ECOG: 1   General appearance: Comfortable appearing without any discomfort Head: Normocephalic without any trauma Oropharynx: Mucous membranes are moist and pink without any thrush or ulcers. Eyes: Pupils are equal and round reactive to light. Lymph nodes: No cervical, supraclavicular, inguinal or axillary lymphadenopathy.   Heart:regular rate and rhythm.  S1 and S2 without leg edema. Lung: Clear without any rhonchi or wheezes.  No dullness to percussion. Abdomin: Soft, nontender, nondistended with good bowel sounds.  No hepatosplenomegaly. Musculoskeletal: No joint deformity or effusion.  Full range of motion noted. Neurological: No deficits noted on motor, sensory and deep tendon reflex exam. Skin: No petechial rash or dryness.  Appeared moist.                  Lab Results: Lab Results  Component Value Date   WBC 5.9 09/26/2021   HGB 12.7 (L) 09/26/2021   HCT 36.9 (L) 09/26/2021   MCV 94.1 09/26/2021   PLT 198 09/26/2021     Chemistry      Component Value Date/Time   NA 140 09/26/2021 0904   NA 141 08/11/2021 1252   K 4.1 09/26/2021 0904   CL 104 09/26/2021 0904   CO2 32 09/26/2021 0904   BUN 15 09/26/2021 0904   BUN 14 08/11/2021 1252   CREATININE 1.42 (H) 09/26/2021 0904      Component Value Date/Time   CALCIUM 8.8 (L) 09/26/2021 0904   ALKPHOS 57 09/26/2021 0904   AST 23 09/26/2021 0904   ALT 25 09/26/2021 0904   BILITOT 0.4 09/26/2021 0904      IMPRESSION: 1. Slight interval enlargement of a centrally hypodense mass centered in the uncinate process of the pancreas, although difficult to clearly distinguish margins, measuring approximately 4.8 x 3.6 cm, previously 4.2 x 3.3 cm when measured similarly. Findings are consistent with enlarging  metastasis or alternately primary pancreatic adenocarcinoma. 2. Hypodense subcapsular lesion of the anterior left lobe of the liver, hepatic segment IVA, unchanged and incompletely characterized by noncontrast CT. 3. Status post right nephrectomy with a large focus of fat necrosis in the right nephrectomy bed. No noncontrast evidence of locally recurrent disease in the nephrectomy bed. 4. Status post right middle and lower lobectomy. No evidence of recurrent thoracic metastatic disease. 5. Punctuate nonobstructive left nephrolithiasis. 6. Coronary artery disease.   Aortic Atherosclerosis (ICD10-I70.0).   Impression and Plan:  76 year old man with:   1.  Stage IV clear-cell renal cell carcinoma with hepatic and peritoneal metastasis documented in 2019.   CT scan obtained on September 26, 2021  was personally reviewed and discussed with the patient.  Overall he has stable disease with slight growth in his pancreatic lesion that could represent tumor necrosis.  Complication associated with cabozantinib including nausea, fatigue, hypertension and apnea syndrome were reiterated.  Alternative treatment options including axitinib among others were reiterated.    2.  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  3.  Renal failure: Creatinine clearance continues to fluctuate.  Overall stable.  4.  Prognosis and goals of care: His disease is incurable although aggressive measures are warranted.  5.  Follow-up: He will return in 2 months for a follow-up visit.   30  minutes were spent on this visit.  The time was dedicated to reviewing laboratory data disease status update and outlining future plan of care discussion.   Zola Button, MD 8/16/202311:48 AM

## 2021-10-09 DIAGNOSIS — S0501XA Injury of conjunctiva and corneal abrasion without foreign body, right eye, initial encounter: Secondary | ICD-10-CM | POA: Diagnosis not present

## 2021-10-13 DIAGNOSIS — B342 Coronavirus infection, unspecified: Secondary | ICD-10-CM | POA: Diagnosis not present

## 2021-10-13 DIAGNOSIS — R070 Pain in throat: Secondary | ICD-10-CM | POA: Diagnosis not present

## 2021-10-13 DIAGNOSIS — J019 Acute sinusitis, unspecified: Secondary | ICD-10-CM | POA: Diagnosis not present

## 2021-10-15 ENCOUNTER — Other Ambulatory Visit (HOSPITAL_COMMUNITY): Payer: Self-pay

## 2021-10-15 ENCOUNTER — Other Ambulatory Visit: Payer: Self-pay | Admitting: Oncology

## 2021-10-15 MED ORDER — CABOMETYX 20 MG PO TABS
20.0000 mg | ORAL_TABLET | Freq: Every day | ORAL | 1 refills | Status: DC
  Filled 2021-10-15: qty 30, 30d supply, fill #0
  Filled 2021-11-28: qty 30, 30d supply, fill #1

## 2021-10-27 ENCOUNTER — Other Ambulatory Visit (HOSPITAL_COMMUNITY): Payer: Self-pay

## 2021-11-06 DIAGNOSIS — L570 Actinic keratosis: Secondary | ICD-10-CM | POA: Diagnosis not present

## 2021-11-06 DIAGNOSIS — L219 Seborrheic dermatitis, unspecified: Secondary | ICD-10-CM | POA: Diagnosis not present

## 2021-11-12 ENCOUNTER — Other Ambulatory Visit (HOSPITAL_COMMUNITY): Payer: Self-pay

## 2021-11-13 ENCOUNTER — Other Ambulatory Visit (HOSPITAL_COMMUNITY): Payer: Self-pay

## 2021-11-17 ENCOUNTER — Other Ambulatory Visit (HOSPITAL_COMMUNITY): Payer: Self-pay

## 2021-11-21 ENCOUNTER — Other Ambulatory Visit: Payer: Self-pay | Admitting: Internal Medicine

## 2021-11-28 ENCOUNTER — Other Ambulatory Visit (HOSPITAL_COMMUNITY): Payer: Self-pay

## 2021-12-09 ENCOUNTER — Inpatient Hospital Stay: Payer: Medicare HMO | Attending: Oncology

## 2021-12-09 ENCOUNTER — Other Ambulatory Visit: Payer: Self-pay

## 2021-12-09 ENCOUNTER — Inpatient Hospital Stay: Payer: Medicare HMO | Admitting: Oncology

## 2021-12-09 VITALS — BP 123/68 | HR 79 | Temp 98.0°F | Resp 17 | Ht 68.0 in | Wt 175.9 lb

## 2021-12-09 DIAGNOSIS — C641 Malignant neoplasm of right kidney, except renal pelvis: Secondary | ICD-10-CM

## 2021-12-09 DIAGNOSIS — E230 Hypopituitarism: Secondary | ICD-10-CM | POA: Insufficient documentation

## 2021-12-09 DIAGNOSIS — N19 Unspecified kidney failure: Secondary | ICD-10-CM | POA: Diagnosis not present

## 2021-12-09 DIAGNOSIS — Z905 Acquired absence of kidney: Secondary | ICD-10-CM | POA: Insufficient documentation

## 2021-12-09 DIAGNOSIS — C787 Secondary malignant neoplasm of liver and intrahepatic bile duct: Secondary | ICD-10-CM | POA: Insufficient documentation

## 2021-12-09 DIAGNOSIS — C786 Secondary malignant neoplasm of retroperitoneum and peritoneum: Secondary | ICD-10-CM | POA: Insufficient documentation

## 2021-12-09 DIAGNOSIS — Z79899 Other long term (current) drug therapy: Secondary | ICD-10-CM | POA: Insufficient documentation

## 2021-12-09 LAB — CMP (CANCER CENTER ONLY)
ALT: 25 U/L (ref 0–44)
AST: 21 U/L (ref 15–41)
Albumin: 3.6 g/dL (ref 3.5–5.0)
Alkaline Phosphatase: 50 U/L (ref 38–126)
Anion gap: 6 (ref 5–15)
BUN: 14 mg/dL (ref 8–23)
CO2: 27 mmol/L (ref 22–32)
Calcium: 7.9 mg/dL — ABNORMAL LOW (ref 8.9–10.3)
Chloride: 105 mmol/L (ref 98–111)
Creatinine: 1.33 mg/dL — ABNORMAL HIGH (ref 0.61–1.24)
GFR, Estimated: 55 mL/min — ABNORMAL LOW (ref >60)
Glucose, Bld: 106 mg/dL — ABNORMAL HIGH (ref 70–99)
Potassium: 3.7 mmol/L (ref 3.5–5.1)
Sodium: 138 mmol/L (ref 135–145)
Total Bilirubin: 0.2 mg/dL — ABNORMAL LOW (ref 0.3–1.2)
Total Protein: 6.5 g/dL (ref 6.5–8.1)

## 2021-12-09 LAB — CBC WITH DIFFERENTIAL (CANCER CENTER ONLY)
Abs Immature Granulocytes: 0.02 10*3/uL (ref 0.00–0.07)
Basophils Absolute: 0 10*3/uL (ref 0.0–0.1)
Basophils Relative: 0 %
Eosinophils Absolute: 0.2 10*3/uL (ref 0.0–0.5)
Eosinophils Relative: 3 %
HCT: 35.3 % — ABNORMAL LOW (ref 39.0–52.0)
Hemoglobin: 12.1 g/dL — ABNORMAL LOW (ref 13.0–17.0)
Immature Granulocytes: 0 %
Lymphocytes Relative: 26 %
Lymphs Abs: 1.6 10*3/uL (ref 0.7–4.0)
MCH: 32.9 pg (ref 26.0–34.0)
MCHC: 34.3 g/dL (ref 30.0–36.0)
MCV: 95.9 fL (ref 80.0–100.0)
Monocytes Absolute: 0.5 10*3/uL (ref 0.1–1.0)
Monocytes Relative: 9 %
Neutro Abs: 3.9 10*3/uL (ref 1.7–7.7)
Neutrophils Relative %: 62 %
Platelet Count: 202 10*3/uL (ref 150–400)
RBC: 3.68 MIL/uL — ABNORMAL LOW (ref 4.22–5.81)
RDW: 13.2 % (ref 11.5–15.5)
WBC Count: 6.2 10*3/uL (ref 4.0–10.5)
nRBC: 0 % (ref 0.0–0.2)

## 2021-12-09 NOTE — Progress Notes (Signed)
Hematology and Oncology Follow Up Visit  [name] [id number] 05-Jan-1946 76 y.o. 12/09/2021 10:08 AM [name], MDElkins, [name], *   Principle Diagnosis: 76 year old man with stage IV clear-cell renal cell carcinoma with hepatic and omental involvement confirmed in 2019.  He initially presented with localized disease in 2016.  Prior Therapy: He underwent a radical nephrectomy completed by Dr. Louis Perry on February 01, 2015.  The final pathology revealed a 3.3 cm clear cell renal cell carcinoma with Fuhrman grade 3.  Margins were negative indicating stage T1 a disease  He is status post omental biopsy in July 2019 which confirmed the presence of recurrent metastatic renal cell carcinoma.  Nivolumab at 3 mg/mg and ipilimumab at 1 mg/kg cycle 1 started on 09/21/2017.  He completed 4 cycles of therapy.   He did not receive any additional immunotherapy after developing adrenal insufficiency and panhypopituitarism.  He is status post radiation therapy to abdominal lymph node completed on November 08, 2019.  He received total of 30 Gy in 10 fractions  Current therapy: Cabometyx 40 mg daily started in March 2023.  His dose changed to 20 mg daily since April 2023.  Interim History: [name] presents today for return evaluation.  Since the last visit, he reports no major changes in his health.  He continues to tolerate the current dose and schedule of Cabometyx without any major concerns.  He does report some occasional upset stomach and dyspepsia but no nausea, vomiting.  He has reported some loose bowel habits and diarrhea at nighttime.  He does not report any excessive fatigue or tiredness.  His blood pressure has been under excellent control.           .      Medications: Reviewed without changes. Current Outpatient Medications  Medication Sig Dispense Refill   acetaminophen (TYLENOL) 325 MG tablet Take 2 tablets (650 mg total) by mouth every 6 (six) hours as needed  for mild pain or headache (over the counter). (Patient taking differently: Take 650 mg by mouth every 6 (six) hours as needed for mild pain or headache.) 30 tablet 3   benzonatate (TESSALON) 100 MG capsule Take 1 capsule (100 mg total) by mouth 3 (three) times daily as needed for cough. 20 capsule 0   cabozantinib (CABOMETYX) 20 MG tablet Take 1 tablet (20 mg total) by mouth daily. Take on an empty stomach, 1 hour before or 2 hours after meals. 30 tablet 1   Cholecalciferol (VITAMIN D3) 50 MCG (2000 UT) TABS Take 2,000 Units by mouth daily.     diltiazem (CARDIZEM CD) 120 MG 24 hr capsule Take 120 mg by mouth in the morning.     ELIQUIS 5 MG TABS tablet Take 1 tablet (5 mg total) by mouth 2 (two) times daily. 60 tablet 2   famotidine (PEPCID) 20 MG tablet Take 20 mg by mouth See admin instructions. Take 20 mg by mouth at 3 PM daily and an additional 20 mg once a day as needed for heartburn or indigestion     guaiFENesin-dextromethorphan (ROBITUSSIN DM) 100-10 MG/5ML syrup Take 5 mLs by mouth every 4 (four) hours as needed for cough. 118 mL 0   hydrocortisone (CORTEF) 10 MG tablet Please take 10 mg in am and 5 mg in PM. Please allow for EMG dosing 160 tablet 3   levothyroxine (SYNTHROID) 150 MCG tablet TAKE 1 TABLET BY MOUTH EVERY OTHER DAY 30 tablet 3   levothyroxine (SYNTHROID) 175 MCG tablet TAKE 1 TABLET  BY MOUTH EVERY OTHER DAY 30 tablet 3   Multiple Vitamins-Minerals (CENTRUM SILVER 50+MEN) TABS Take 1 tablet by mouth daily with breakfast.     temazepam (RESTORIL) 7.5 MG capsule Take 1 capsule (7.5 mg total) by mouth at bedtime as needed for sleep. 30 capsule 0   traZODone (DESYREL) 150 MG tablet Take 150 mg by mouth at bedtime as needed for sleep.     No current facility-administered medications for this visit.     Allergies:  Allergies  Allergen Reactions   Benadryl [Diphenhydramine] Other (See Comments)    Severe restless legs        Physical Exam:   Blood pressure 123/68,  pulse 79, temperature 98 F (36.7 C), temperature source Temporal, resp. rate 17, height 5\' 8"  (1.727 m), weight 175 lb 14.4 oz (79.8 kg), SpO2 97 %.    ECOG: 1    General appearance: Alert, awake without any distress. Head: Atraumatic without abnormalities Oropharynx: Without any thrush or ulcers. Eyes: No scleral icterus. Lymph nodes: No lymphadenopathy noted in the cervical, supraclavicular, or axillary nodes Heart:regular rate and rhythm, without any murmurs or gallops.   Lung: Clear to auscultation without any rhonchi, wheezes or dullness to percussion. Abdomin: Soft, nontender without any shifting dullness or ascites. Musculoskeletal: No clubbing or cyanosis. Neurological: No motor or sensory deficits. Skin: No rashes or lesions.                   Lab Results: Lab Results  Component Value Date   WBC 5.9 09/26/2021   HGB 12.7 (L) 09/26/2021   HCT 36.9 (L) 09/26/2021   MCV 94.1 09/26/2021   PLT 198 09/26/2021     Chemistry      Component Value Date/Time   NA 140 09/26/2021 0904   NA 141 08/11/2021 1252   K 4.1 09/26/2021 0904   CL 104 09/26/2021 0904   CO2 32 09/26/2021 0904   BUN 15 09/26/2021 0904   BUN 14 08/11/2021 1252   CREATININE 1.42 (H) 09/26/2021 0904      Component Value Date/Time   CALCIUM 8.8 (L) 09/26/2021 0904   ALKPHOS 57 09/26/2021 0904   AST 23 09/26/2021 0904   ALT 25 09/26/2021 0904   BILITOT 0.4 09/26/2021 0904         Impression and Plan:  76 year old man with:   1.  Kidney cancer diagnosed in 2016.  He developed stage IV clear-cell renal cell carcinoma with hepatic and peritoneal metastasis documented in 2019.   He is currently on active surveillance without any evidence of relapsed disease.  Risks and benefits of continuing Cabometyx were discussed.  Alternative treatment options including in light or other oral targeted therapy such as Lenvima or others.  I recommended updating his staging scan in 2 months.  He  is agreeable to with this plan.  Reducing the Cabometyx dosing further could be considered versus switching to a different agent was also discussed.    2.  Hypopituitary disease: He is currently on hormone replacement.  This is considered to be autoimmune in nature.  I would be hesitant to rechallenge him with checkpoint inhibitors at this point.  3.  Renal failure: His kidney function continues to be at baseline of around 50 cc/min.  4.  Prognosis and goals of care: Therapy remains palliative although aggressive measures are warranted given his excellent performance status.  5.  Follow-up: He will return in 2 months for a follow-up evaluation and repeat imaging studies.  30  minutes were spent on this encounter.  The time was dedicated to reviewing laboratory data, imaging studies, treatment choices and future plan of care review.   Zola Button, MD 10/24/202310:08 AM

## 2021-12-15 DIAGNOSIS — H00014 Hordeolum externum left upper eyelid: Secondary | ICD-10-CM | POA: Diagnosis not present

## 2021-12-17 ENCOUNTER — Other Ambulatory Visit (HOSPITAL_COMMUNITY): Payer: Self-pay

## 2021-12-17 DIAGNOSIS — Z23 Encounter for immunization: Secondary | ICD-10-CM | POA: Diagnosis not present

## 2021-12-22 ENCOUNTER — Other Ambulatory Visit (HOSPITAL_COMMUNITY): Payer: Self-pay

## 2021-12-23 ENCOUNTER — Other Ambulatory Visit (HOSPITAL_COMMUNITY): Payer: Self-pay

## 2021-12-23 ENCOUNTER — Other Ambulatory Visit: Payer: Self-pay | Admitting: Oncology

## 2021-12-23 DIAGNOSIS — C641 Malignant neoplasm of right kidney, except renal pelvis: Secondary | ICD-10-CM

## 2021-12-23 MED ORDER — CABOMETYX 20 MG PO TABS
20.0000 mg | ORAL_TABLET | Freq: Every day | ORAL | 1 refills | Status: DC
  Filled 2021-12-23: qty 30, 30d supply, fill #0
  Filled 2022-01-29: qty 30, 30d supply, fill #1

## 2021-12-24 ENCOUNTER — Other Ambulatory Visit (HOSPITAL_COMMUNITY): Payer: Self-pay

## 2022-01-02 DIAGNOSIS — J019 Acute sinusitis, unspecified: Secondary | ICD-10-CM | POA: Diagnosis not present

## 2022-01-02 DIAGNOSIS — N189 Chronic kidney disease, unspecified: Secondary | ICD-10-CM | POA: Diagnosis not present

## 2022-01-02 DIAGNOSIS — J329 Chronic sinusitis, unspecified: Secondary | ICD-10-CM | POA: Diagnosis not present

## 2022-01-15 ENCOUNTER — Telehealth: Payer: Self-pay | Admitting: Oncology

## 2022-01-15 NOTE — Telephone Encounter (Signed)
Called patient regarding December appointment, patient is notified.

## 2022-01-16 ENCOUNTER — Ambulatory Visit: Payer: Medicare HMO | Admitting: Cardiovascular Disease

## 2022-01-19 ENCOUNTER — Other Ambulatory Visit (HOSPITAL_COMMUNITY): Payer: Self-pay

## 2022-01-20 DIAGNOSIS — J342 Deviated nasal septum: Secondary | ICD-10-CM | POA: Diagnosis not present

## 2022-01-20 DIAGNOSIS — J310 Chronic rhinitis: Secondary | ICD-10-CM | POA: Diagnosis not present

## 2022-01-20 DIAGNOSIS — R519 Headache, unspecified: Secondary | ICD-10-CM | POA: Diagnosis not present

## 2022-01-28 ENCOUNTER — Telehealth: Payer: Self-pay | Admitting: Pharmacy Technician

## 2022-01-28 DIAGNOSIS — R519 Headache, unspecified: Secondary | ICD-10-CM | POA: Diagnosis not present

## 2022-01-28 DIAGNOSIS — J329 Chronic sinusitis, unspecified: Secondary | ICD-10-CM | POA: Diagnosis not present

## 2022-01-28 NOTE — Telephone Encounter (Signed)
Oral Oncology Patient Advocate Encounter   Received notification that prior authorization for Cabometyx is due for renewal.   PA submitted on 01/28/22 Key [id number]  PA not required for continuation of therapy. Case will be re-opened if needed at a later time.  Lady Deutscher, CPhT-Adv [facility]cology [facility] Direct Number: [phone]  Fax: [phone]

## 2022-01-29 ENCOUNTER — Other Ambulatory Visit (HOSPITAL_COMMUNITY): Payer: Self-pay

## 2022-01-30 ENCOUNTER — Other Ambulatory Visit: Payer: Self-pay

## 2022-02-03 DIAGNOSIS — M279 Disease of jaws, unspecified: Secondary | ICD-10-CM | POA: Diagnosis not present

## 2022-02-03 DIAGNOSIS — J019 Acute sinusitis, unspecified: Secondary | ICD-10-CM | POA: Diagnosis not present

## 2022-02-03 DIAGNOSIS — Z5181 Encounter for therapeutic drug level monitoring: Secondary | ICD-10-CM | POA: Diagnosis not present

## 2022-02-03 DIAGNOSIS — I2510 Atherosclerotic heart disease of native coronary artery without angina pectoris: Secondary | ICD-10-CM | POA: Diagnosis not present

## 2022-02-05 NOTE — Progress Notes (Signed)
Office Visit    Patient Name: [name] Date of Encounter: 02/06/2022  PCP:  [name], [facility]  Cardiologist:  Sanda Klein, MD  Advanced Practice Provider:  No care team member to display Electrophysiologist:  None  HPI    Sirius Micheletti is a 76 y.o. male with a past medical history of multivessel CAD including subtotal CTO mid RCA with collaterals noted on cardiac catheterization 06/2008, pericardial effusion resolved on last echo 08/2018, with frequent PVCs, hypertension, hyperlipidemia, adrenal insufficiency, hypothyroidism, GERD, recurrent hernia follow-up as discussed with right lower lobectomy in 02/1990, metastatic renal cancer status post right nephrectomy in 01/2015 presents today for follow-up appointment.  Patient is a former patient of Dr. Wynonia Lawman and now follows with Dr. Mirian Mo.  Known CAD on cardiac catheterization 06/2008 with multivessel disease showing total occlusion (95 to 99% stenosis) of the mid RCA with bridging collaterals and left-to-right collateral, either occluded first diagonal branch for an ulcerated plaque in the proximal portion of the vessel prior to subtotal perforator, 80% stenosis intermediate branch of LCx and 50% stenosis.  LVEF 65 to 70% at that time.  Last ischemic valuation was a nuclear stress test in 05/2017 which showed no evidence of ischemia.  Last echo 08/2018 with LVEF 63%, normal wall motion and grade 1 DD with complete resolution of prior pericardial effusion.  Prior CT scans showed extensive atherosclerosis of the coronaries, aorta, aortic arch, and abdominal and iliac vessels.  Asymptomatic.  He had not been on lipid-lowering therapy due to severe muscle weakness with statins.  Saw Dr. Radford Pax 12/2019 at which time he was doing well.  He had widely metastatic renal cancer (involving the lung and liver, omentum, right show R lymph nodes) but was doing well with immunotherapy and radiation therapy.  He was  admitted 03/2020 after presenting with significantly in general function of appetite.  He has been noted to be febrile in the ED and was admitted for SIRS and acute on chronic CKD stage III.  He was initially treated with antibiotics but urine culture levels open negative.  Treated with IV fluids.  CT in the ED did show progression of malignancy in the right upper retroperitoneal area with enlarged lymphadenopathy.  Per oncology, therapy is considered palliative.  Patient was seen 07/2021.  He was doing well from a cardiac standpoint.  No lower extremity edema on exam at that time.  He had some palpitations after restarting chemo earlier this year and describes it as his heart racing 1 hour 3 days in a row after starting chemo.  His PCP (Dr. Arelia Sneddon) order heart monitor which reportedly showed 7 fluttering and patient was started on Eliquis.  However, patient stated Eliquis was started because he was very sick at the time he was told he was at risk for heart block.  We were unable to review the monitor results and see if the monitor showed atrial fibrillation/flutter.  Remains on Eliquis.  Tolerating well.  He was told he now has cancer in his pancreas.  Today, he tells me that he has had 2 months of jaw pain on the left side.  When it first started he thought it was due to a sinus infection and he saw his dentist and medical doctor.  They did a full workup and could not find anything wrong.  He states that he went to an ENT physician as well who ordered a CT of his face as well as x-rays.  There was no source of his pain found.  He wore a cardiac monitor in the past for fatigue (06/2021).  He has not had any shortness of breath or chest pain.  Last echocardiogram was 08/2018 which was ordered for a pericardial effusion.  He states that his jaw pain is continuous and is not made worse by exertion.  Reports no shortness of breath nor dyspnea on exertion. Reports no chest pain, pressure, or tightness. No edema,  orthopnea, PND. Reports no palpitations.   Past Medical History    Past Medical History:  Diagnosis Date   Arthritis    Chronic kidney disease    only has one kidney    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 02/01/2015   Coronary artery disease    followed by Dr.Tilley   Frequent PVCs    GERD (gastroesophageal reflux disease)    Heart murmur    Hx of cancer of lung 1980's   Hyperlipidemia    Hypertension    Incisional hernia 08/01/2015   Lung cancer ([place]) 1993   Lung metastases 2019   Recurrent umbilical hernia 24/40/1027   Past Surgical History:  Procedure Laterality Date   APPENDECTOMY  age 59   CHOLECYSTECTOMY  27's   LAPAROSCOPIC LYSIS OF ADHESIONS N/A 08/01/2015   Procedure: LAPAROSCOPIC LYSIS OF ADHESIONS;  Surgeon: Michael Boston, MD;  Location: [facility];  Service: General;  Laterality: N/A;   LUNG LOBECTOMY  1992   lung cancer- patient has staples in lung not to have MRI per patient    PILONIDAL CYST EXCISION  2011   Dr Harlow Asa   ROBOTIC ASSITED PARTIAL NEPHRECTOMY Right 02/01/2015   Procedure: RIGHT ROBOTIC ASSISTED LAPAROSOCOPY NEPHRECTOMY;  Surgeon: Ardis Hughs, MD;  Location: [facility];  Service: Urology;  Laterality: Right;   VENTRAL HERNIA REPAIR N/A 08/01/2015   Procedure: LAPAROSCOPIC VENTRAL WALL HERNIA WITH MESH;  Surgeon: Michael Boston, MD;  Location: [facility];  Service: General;  Laterality: N/A;    Allergies  Allergies  Allergen Reactions   Diphenhydramine Other (See Comments)    Severe restless legs    EKGs/Labs/Other Studies Reviewed:   The following studies were reviewed today:  Carotid US 07/2021 Summary:  Right Carotid: Velocities in the right ICA are consistent with a 1-39%  stenosis.                Non-hemodynamically significant plaque <50% noted in the  CCA.   Left Carotid: Velocities in the left ICA are consistent with a 1-39%  stenosis.               Non-hemodynamically significant plaque <50% noted in  the  CCA.   Vertebrals: Bilateral vertebral arteries demonstrate antegrade flow.  Subclavians: Normal flow hemodynamics were seen in bilateral subclavian               arteries.   *See table(s) above for measurements and observations.  Cardiac Catheterization 06/27/2008: ANGIOGRAPHIC DATA:  Left ventriculogram:  Performed in the 30 degrees RAO projection.  The aortic valve is normal.  The mitral valve is normal.  The left ventricle appears normal in size.  Estimated ejection fraction is 65-70%.  Coronary arteries arise and distribute normally. There is calcification noted involving all three vessels.  It is mild to moderate in nature.  Left main coronary artery is normal.  Left anterior descending has either an occluded first diagonal branch or an ulcerated plaque in the proximal portion of the vessel prior to  the septal perforator.  Very little in the way of collaterals is seen.  There is a mild diffuse disease present and a 30% mid stenosis is noted.   CIRCUMFLEX CORONARY ARTERY:  An intermediate branch has a long segmental 80% stenosis and is somewhat small in portion.  The circumflex has a first marginal branch which has a segmental 50% stenosis which is segmental in its proximal portion.  Collateral filling is seen of the distal right coronary artery with bidirectional flow noted.  The right coronary artery is calcified.  There is a long severe segmental stenosis of 95-99% in the proximal to the midportion of the vessel with was some evidence of bridging collaterals, but forward antegrade flow was noted.   IMPRESSION:  Coronary artery disease with subtotal occlusion of the right coronary artery with bridging collaterals and left-to-right collaterals, diffuse disease involving the proximal LAD, first marginal branch intermediate branch. _______________   Echocardiogram 09/06/2018: Impressions: 1. The left ventricle has normal systolic function, with an ejection  fraction of 60-65%.  The cavity size was normal. There is mildly increased  left ventricular wall thickness. Left ventricular diastolic Doppler  parameters are consistent with impaired  relaxation. No evidence of left ventricular regional wall motion  abnormalities.   2. The right ventricle has normal systolc function. The cavity was  normal. There is no increase in right ventricular wall thickness.   3. There is mild mitral annular calcification present.   4. The aortic valve is tricuspid.   5. Pulmonic valve regurgitation was not assessed by color flow Doppler.   6. The aortic root is normal in size and structure.   7. No significant pericardial effusion.    EKG:  EKG is not ordered today.   Recent Labs: 03/28/2021: Magnesium 2.0 07/23/2021: TSH 1.51 02/06/2022: ALT 20; BUN 21; Creatinine 1.36; Hemoglobin 11.0; Platelet Count 229; Potassium 4.2; Sodium 140  Recent Lipid Panel No results found for: "CHOL", "TRIG", "HDL", "CHOLHDL", "VLDL", "LDLCALC", "LDLDIRECT"  Home Medications   Current Meds  Medication Sig   acetaminophen (TYLENOL) 325 MG tablet Take 2 tablets (650 mg total) by mouth every 6 (six) hours as needed for mild pain or headache (over the counter).   benzonatate (TESSALON) 100 MG capsule Take 1 capsule (100 mg total) by mouth 3 (three) times daily as needed for cough.   cabozantinib (CABOMETYX) 20 MG tablet Take 1 tablet (20 mg total) by mouth daily. Take on an empty stomach, 1 hour before or 2 hours after meals.   Cholecalciferol (VITAMIN D3) 50 MCG (2000 UT) TABS Take 2,000 Units by mouth daily.   diltiazem (CARDIZEM CD) 120 MG 24 hr capsule Take 120 mg by mouth in the morning.   ELIQUIS 5 MG TABS tablet Take 1 tablet (5 mg total) by mouth 2 (two) times daily.   famotidine (PEPCID) 20 MG tablet Take 20 mg by mouth See admin instructions. Take 20 mg by mouth at 3 PM daily and an additional 20 mg once a day as needed for heartburn or indigestion   guaiFENesin-dextromethorphan (ROBITUSSIN DM)  100-10 MG/5ML syrup Take 5 mLs by mouth every 4 (four) hours as needed for cough.   hydrocortisone (CORTEF) 10 MG tablet Please take 10 mg in am and 5 mg in PM. Please allow for EMG dosing   levothyroxine (SYNTHROID) 150 MCG tablet TAKE 1 TABLET BY MOUTH EVERY OTHER DAY   levothyroxine (SYNTHROID) 175 MCG tablet TAKE 1 TABLET BY MOUTH EVERY OTHER DAY   Multiple Vitamins-Minerals (  CENTRUM SILVER 50+MEN) TABS Take 1 tablet by mouth daily with breakfast.   temazepam (RESTORIL) 7.5 MG capsule Take 1 capsule (7.5 mg total) by mouth at bedtime as needed for sleep.   traZODone (DESYREL) 150 MG tablet Take 150 mg by mouth at bedtime as needed for sleep.     Review of Systems      All other systems reviewed and are otherwise negative except as noted above.  Physical Exam    VS:  BP (!) 103/48   Pulse 70   Ht 5\' 8"  (1.727 m)   Wt 176 lb (79.8 kg)   SpO2 94%   BMI 26.76 kg/m  , BMI Body mass index is 26.76 kg/m.  Wt Readings from Last 3 Encounters:  02/06/22 176 lb (79.8 kg)  12/09/21 175 lb 14.4 oz (79.8 kg)  10/01/21 177 lb 3.2 oz (80.4 kg)     GEN: Well nourished, well developed, in no acute distress. HEENT: normal. Neck: Supple, no JVD, + right carotid bruit, or masses. Cardiac: RRR, no murmurs, rubs, or gallops. No clubbing, cyanosis, edema.  Radials/PT 2+ and equal bilaterally.  Respiratory:  Respirations regular and unlabored, clear to auscultation bilaterally. GI: Soft, nontender, nondistended. MS: No deformity or atrophy. Skin: Warm and dry, no rash. Neuro:  Strength and sensation are intact. Psych: Normal affect.  Assessment & Plan    CAD -This has been stable He denies any chest pain or shortness of breath- -he does endorse left-sided jaw pain which has been going on for the last 2 months -The jaw pain is nonexertional -We will order an echocardiogram and if there are any significant changes we would pursue an ischemic workup -Continue Eliquis 5 mg twice a day,  Cardizem 120 mg daily  Frequent PVC/questionable atrial flutter/fib -stable con cardizem 120mg  -no recent palpitations -continue Eliquis 5mg  BID  Carotid bruit -recent US reviewed with mild bilateral carotid artery disease  Hyperlipidemia -No recent lipid panel -Followed by PCP -He has statin intolerance due to severe muscle weakness  CKD stage III -Most recent creatinine 1.36 -Continue to avoid nephrotoxic medications        Disposition: Follow up 6 weeks with Sanda Klein, MD or APP.  Signed, Elgie Collard, PA-C 02/06/2022, 11:10 AM [facility]

## 2022-02-06 ENCOUNTER — Ambulatory Visit (INDEPENDENT_AMBULATORY_CARE_PROVIDER_SITE_OTHER): Payer: Medicare HMO | Admitting: Physician Assistant

## 2022-02-06 ENCOUNTER — Encounter: Payer: Self-pay | Admitting: Physician Assistant

## 2022-02-06 ENCOUNTER — Inpatient Hospital Stay: Payer: Medicare HMO | Attending: Oncology

## 2022-02-06 ENCOUNTER — Ambulatory Visit (HOSPITAL_COMMUNITY)
Admission: RE | Admit: 2022-02-06 | Discharge: 2022-02-06 | Disposition: A | Discharge status: Home / Self Care | Payer: Medicare HMO | Source: Ambulatory Visit | Attending: Oncology | Admitting: Oncology

## 2022-02-06 ENCOUNTER — Encounter (HOSPITAL_COMMUNITY): Payer: Self-pay

## 2022-02-06 VITALS — BP 103/48 | HR 70 | Ht 68.0 in | Wt 176.0 lb

## 2022-02-06 DIAGNOSIS — I493 Ventricular premature depolarization: Secondary | ICD-10-CM

## 2022-02-06 DIAGNOSIS — I2510 Atherosclerotic heart disease of native coronary artery without angina pectoris: Secondary | ICD-10-CM | POA: Diagnosis not present

## 2022-02-06 DIAGNOSIS — E785 Hyperlipidemia, unspecified: Secondary | ICD-10-CM | POA: Diagnosis not present

## 2022-02-06 DIAGNOSIS — R0989 Other specified symptoms and signs involving the circulatory and respiratory systems: Secondary | ICD-10-CM

## 2022-02-06 DIAGNOSIS — R6884 Jaw pain: Secondary | ICD-10-CM | POA: Insufficient documentation

## 2022-02-06 DIAGNOSIS — K769 Liver disease, unspecified: Secondary | ICD-10-CM | POA: Diagnosis not present

## 2022-02-06 DIAGNOSIS — N183 Chronic kidney disease, stage 3 unspecified: Secondary | ICD-10-CM | POA: Insufficient documentation

## 2022-02-06 DIAGNOSIS — C641 Malignant neoplasm of right kidney, except renal pelvis: Secondary | ICD-10-CM | POA: Insufficient documentation

## 2022-02-06 DIAGNOSIS — C787 Secondary malignant neoplasm of liver and intrahepatic bile duct: Secondary | ICD-10-CM | POA: Insufficient documentation

## 2022-02-06 DIAGNOSIS — C786 Secondary malignant neoplasm of retroperitoneum and peritoneum: Secondary | ICD-10-CM | POA: Insufficient documentation

## 2022-02-06 DIAGNOSIS — C649 Malignant neoplasm of unspecified kidney, except renal pelvis: Secondary | ICD-10-CM | POA: Diagnosis not present

## 2022-02-06 DIAGNOSIS — R918 Other nonspecific abnormal finding of lung field: Secondary | ICD-10-CM | POA: Diagnosis not present

## 2022-02-06 LAB — CBC WITH DIFFERENTIAL (CANCER CENTER ONLY)
Abs Immature Granulocytes: 0.01 10*3/uL (ref 0.00–0.07)
Basophils Absolute: 0 10*3/uL (ref 0.0–0.1)
Basophils Relative: 0 %
Eosinophils Absolute: 0.2 10*3/uL (ref 0.0–0.5)
Eosinophils Relative: 5 %
HCT: 34.2 % — ABNORMAL LOW (ref 39.0–52.0)
Hemoglobin: 11 g/dL — ABNORMAL LOW (ref 13.0–17.0)
Immature Granulocytes: 0 %
Lymphocytes Relative: 33 %
Lymphs Abs: 1.6 10*3/uL (ref 0.7–4.0)
MCH: 31.4 pg (ref 26.0–34.0)
MCHC: 32.2 g/dL (ref 30.0–36.0)
MCV: 97.7 fL (ref 80.0–100.0)
Monocytes Absolute: 0.6 10*3/uL (ref 0.1–1.0)
Monocytes Relative: 12 %
Neutro Abs: 2.5 10*3/uL (ref 1.7–7.7)
Neutrophils Relative %: 50 %
Platelet Count: 229 10*3/uL (ref 150–400)
RBC: 3.5 MIL/uL — ABNORMAL LOW (ref 4.22–5.81)
RDW: 14.3 % (ref 11.5–15.5)
WBC Count: 4.9 10*3/uL (ref 4.0–10.5)
nRBC: 0 % (ref 0.0–0.2)

## 2022-02-06 LAB — CMP (CANCER CENTER ONLY)
ALT: 20 U/L (ref 0–44)
AST: 20 U/L (ref 15–41)
Albumin: 3.5 g/dL (ref 3.5–5.0)
Alkaline Phosphatase: 57 U/L (ref 38–126)
Anion gap: 3 — ABNORMAL LOW (ref 5–15)
BUN: 21 mg/dL (ref 8–23)
CO2: 35 mmol/L — ABNORMAL HIGH (ref 22–32)
Calcium: 9 mg/dL (ref 8.9–10.3)
Chloride: 102 mmol/L (ref 98–111)
Creatinine: 1.36 mg/dL — ABNORMAL HIGH (ref 0.61–1.24)
GFR, Estimated: 53 mL/min — ABNORMAL LOW (ref >60)
Glucose, Bld: 97 mg/dL (ref 70–99)
Potassium: 4.2 mmol/L (ref 3.5–5.1)
Sodium: 140 mmol/L (ref 135–145)
Total Bilirubin: 0.3 mg/dL (ref 0.3–1.2)
Total Protein: 6.6 g/dL (ref 6.5–8.1)

## 2022-02-06 NOTE — Patient Instructions (Signed)
Medication Instructions:  Your physician recommends that you continue on your current medications as directed. Please refer to the Current Medication list given to you today.  *If you need a refill on your cardiac medications before your next appointment, please call your pharmacy*   Lab Work: None If you have labs (blood work) drawn today and your tests are completely normal, you will receive your results only by: [place] (if you have MyChart) OR A paper copy in the mail If you have any lab test that is abnormal or we need to change your treatment, we will call you to review the results.   Testing/Procedures: Your physician has requested that you have an echocardiogram. Echocardiography is a painless test that uses sound waves to create images of your heart. It provides your doctor with information about the size and shape of your heart and how well your heart's chambers and valves are working. This procedure takes approximately one hour. There are no restrictions for this procedure. Please do NOT wear cologne, perfume, aftershave, or lotions (deodorant is allowed). Please arrive 15 minutes prior to your appointment time.    Follow-Up: At [facility], you and your health needs are our priority.  As part of our continuing mission to provide you with exceptional heart care, we have created designated Provider Care Teams.  These Care Teams include your primary Cardiologist (physician) and Advanced Practice Providers (APPs -  Physician Assistants and Nurse Practitioners) who all work together to provide you with the care you need, when you need it.  We recommend signing up for the patient portal called "MyChart".  Sign up information is provided on this After Visit Summary.  MyChart is used to connect with patients for Virtual Visits (Telemedicine).  Patients are able to view lab/test results, encounter notes, upcoming appointments, etc.  Non-urgent messages can be sent to your  provider as well.   To learn more about what you can do with MyChart, go to [email or web address].    Your next appointment:   6 week(s)  The format for your next appointment:   In Person  Provider:   Sanda Klein, MD  or APP  Important Information About Sugar

## 2022-02-10 ENCOUNTER — Other Ambulatory Visit: Payer: Medicare HMO

## 2022-02-11 ENCOUNTER — Telehealth: Payer: Self-pay

## 2022-02-11 NOTE — Telephone Encounter (Signed)
Inbound call from Dr Arelia Sneddon to discuss pt current medication regimen. Pt is hesitant to start prednisone because he is currently on Cortef. Suspects pt has arthritis.

## 2022-02-12 NOTE — Telephone Encounter (Signed)
Dr Arelia Sneddon contacted office and discussed concerns and questions with Dr Cruzita Lederer.

## 2022-02-17 ENCOUNTER — Inpatient Hospital Stay: Payer: Medicare HMO | Attending: Oncology | Admitting: Oncology

## 2022-02-17 ENCOUNTER — Other Ambulatory Visit (HOSPITAL_COMMUNITY): Payer: Self-pay

## 2022-02-17 ENCOUNTER — Other Ambulatory Visit: Payer: Self-pay

## 2022-02-17 VITALS — BP 141/69 | HR 69 | Temp 98.1°F | Resp 17 | Ht 68.0 in | Wt 172.7 lb

## 2022-02-17 DIAGNOSIS — Z79899 Other long term (current) drug therapy: Secondary | ICD-10-CM | POA: Diagnosis not present

## 2022-02-17 DIAGNOSIS — E230 Hypopituitarism: Secondary | ICD-10-CM | POA: Diagnosis not present

## 2022-02-17 DIAGNOSIS — C786 Secondary malignant neoplasm of retroperitoneum and peritoneum: Secondary | ICD-10-CM | POA: Diagnosis not present

## 2022-02-17 DIAGNOSIS — C787 Secondary malignant neoplasm of liver and intrahepatic bile duct: Secondary | ICD-10-CM | POA: Diagnosis not present

## 2022-02-17 DIAGNOSIS — C641 Malignant neoplasm of right kidney, except renal pelvis: Secondary | ICD-10-CM | POA: Diagnosis not present

## 2022-02-17 DIAGNOSIS — N19 Unspecified kidney failure: Secondary | ICD-10-CM | POA: Insufficient documentation

## 2022-02-17 NOTE — Progress Notes (Signed)
Hematology and Oncology Follow Up Visit  Densil Ottey [id number] 1945/12/17 77 y.o. 02/17/2022 9:35 AM [name], MDElkins, [name], *   Principle Diagnosis: 77 year old man with kidney cancer diagnosed in 2016.  He developed stage IV clear-cell with hepatic and omental involvement confirmed in 2019.   Prior Therapy: He underwent a radical nephrectomy completed by Dr. Louis Meckel on February 01, 2015.  The final pathology revealed a 3.3 cm clear cell renal cell carcinoma with Fuhrman grade 3.  Margins were negative indicating stage T1 a disease  He is status post omental biopsy in July 2019 which confirmed the presence of recurrent metastatic renal cell carcinoma.  Nivolumab at 3 mg/mg and ipilimumab at 1 mg/kg cycle 1 started on 09/21/2017.  He completed 4 cycles of therapy.   He did not receive any additional immunotherapy after developing adrenal insufficiency and panhypopituitarism.  He is status post radiation therapy to abdominal lymph node completed on November 08, 2019.  He received total of 30 Gy in 10 fractions  Current therapy: Cabometyx 40 mg daily started in March 2023.  His dose changed to 20 mg daily since April 2023.  Interim History: Mr. Alves returns today for repeat evaluation.  Since last visit, he reports no major changes in his health.  He continues to tolerate the Cabometyx at the current dose without any issues.  He denies any nausea, vomiting or abdominal pain.  He does report of occasional diarrhea but has not been persistent.  Continues to have issues with jaw pain and has been evaluated with imaging studies including CT scan that did not show any evidence of malignancy.  His performance status and quality of life remains unchanged currently.           .      Medications: Updated on review. Current Outpatient Medications  Medication Sig Dispense Refill   acetaminophen (TYLENOL) 325 MG tablet Take 2 tablets (650 mg total) by mouth every 6 (six)  hours as needed for mild pain or headache (over the counter). 30 tablet 3   benzonatate (TESSALON) 100 MG capsule Take 1 capsule (100 mg total) by mouth 3 (three) times daily as needed for cough. 20 capsule 0   cabozantinib (CABOMETYX) 20 MG tablet Take 1 tablet (20 mg total) by mouth daily. Take on an empty stomach, 1 hour before or 2 hours after meals. 30 tablet 1   Cholecalciferol (VITAMIN D3) 50 MCG (2000 UT) TABS Take 2,000 Units by mouth daily.     diltiazem (CARDIZEM CD) 120 MG 24 hr capsule Take 120 mg by mouth in the morning.     ELIQUIS 5 MG TABS tablet Take 1 tablet (5 mg total) by mouth 2 (two) times daily. 60 tablet 2   famotidine (PEPCID) 20 MG tablet Take 20 mg by mouth See admin instructions. Take 20 mg by mouth at 3 PM daily and an additional 20 mg once a day as needed for heartburn or indigestion     guaiFENesin-dextromethorphan (ROBITUSSIN DM) 100-10 MG/5ML syrup Take 5 mLs by mouth every 4 (four) hours as needed for cough. 118 mL 0   hydrocortisone (CORTEF) 10 MG tablet Please take 10 mg in am and 5 mg in PM. Please allow for EMG dosing 160 tablet 3   levothyroxine (SYNTHROID) 150 MCG tablet TAKE 1 TABLET BY MOUTH EVERY OTHER DAY 30 tablet 3   levothyroxine (SYNTHROID) 175 MCG tablet TAKE 1 TABLET BY MOUTH EVERY OTHER DAY 30 tablet 3   Multiple Vitamins-Minerals (  CENTRUM SILVER 50+MEN) TABS Take 1 tablet by mouth daily with breakfast.     temazepam (RESTORIL) 7.5 MG capsule Take 1 capsule (7.5 mg total) by mouth at bedtime as needed for sleep. 30 capsule 0   traZODone (DESYREL) 150 MG tablet Take 150 mg by mouth at bedtime as needed for sleep.     No current facility-administered medications for this visit.     Allergies:  Allergies  Allergen Reactions   Diphenhydramine Other (See Comments)    Severe restless legs        Physical Exam:   Blood pressure (!) 141/69, pulse 69, temperature 98.1 F (36.7 C), temperature source Temporal, resp. rate 17, height 5\' 8"   (1.727 m), weight 172 lb 11.2 oz (78.3 kg), SpO2 95 %.     ECOG: 1   General appearance: Comfortable appearing without any discomfort Head: Normocephalic without any trauma Oropharynx: Mucous membranes are moist and pink without any thrush or ulcers. Eyes: Pupils are equal and round reactive to light. Lymph nodes: No cervical, supraclavicular, inguinal or axillary lymphadenopathy.   Heart:regular rate and rhythm.  S1 and S2 without leg edema. Lung: Clear without any rhonchi or wheezes.  No dullness to percussion. Abdomin: Soft, nontender, nondistended with good bowel sounds.  No hepatosplenomegaly. Musculoskeletal: No joint deformity or effusion.  Full range of motion noted. Neurological: No deficits noted on motor, sensory and deep tendon reflex exam. Skin: No petechial rash or dryness.  Appeared moist.                     Lab Results: Lab Results  Component Value Date   WBC 4.9 02/06/2022   HGB 11.0 (L) 02/06/2022   HCT 34.2 (L) 02/06/2022   MCV 97.7 02/06/2022   PLT 229 02/06/2022     Chemistry      Component Value Date/Time   NA 140 02/06/2022 0838   NA 141 08/11/2021 1252   K 4.2 02/06/2022 0838   CL 102 02/06/2022 0838   CO2 35 (H) 02/06/2022 0838   BUN 21 02/06/2022 0838   BUN 14 08/11/2021 1252   CREATININE 1.36 (H) 02/06/2022 0838      Component Value Date/Time   CALCIUM 9.0 02/06/2022 0838   ALKPHOS 57 02/06/2022 0838   AST 20 02/06/2022 0838   ALT 20 02/06/2022 0838   BILITOT 0.3 02/06/2022 0838      IMPRESSION: 1. Further slow growth of ill-defined mass involving the uncinate process of the pancreas consistent with progressive metastatic renal cell carcinoma. 2. Small left aortic lymph nodes are slightly larger, potentially metastases. No other evidence of progressive metastatic disease. 3. Stable low-density subcapsular lesion in the anterior liver from multiple priors, presumably benign. 4. Stable postsurgical changes from  previous right middle and lower lobectomy. Small left lower lobe pulmonary nodules are unchanged. 5. Stable fat necrosis within the right nephrectomy bed. 6.  Aortic Atherosclerosis (ICD10-I70.0).   Impression and Plan:  77 year old man with:   1.  Stage IV clear-cell renal cell carcinoma with hepatic and peritoneal metastasis diagnosed in 2019.   He continues to be on Cabometyx without any major complications.  CT scan obtained on February 06, 2022 showed overall stable disease with very little progression noted of his pancreatic lesion.  Risks and benefits of continuing the current treatment versus switching to a different salvage therapy option were discussed.  He is options including axitinib, lenvatinib among other oral targeted therapy.  Given his overall stable disease and no  new areas of progression I have recommended continuing the same dose and schedule.  If his progression exceeds 25 to 50% increase or new lesions is detected, we will switch him to axitinib at that time.    2.  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  3.  Renal failure: Kidney function remains stable at this time without any decline.  4.  Prognosis and goals of care: His disease is incurable although aggressive measures are warranted given his reasonable performance status.  5.  Follow-up: In 2 months for a follow-up.   30  minutes were dedicated to this visit.  The time was spent on updating disease status, treatment choices and outlining future plan of care review.   Zola Button, MD 1/2/20249:35 AM

## 2022-02-18 ENCOUNTER — Other Ambulatory Visit (HOSPITAL_COMMUNITY): Payer: Self-pay

## 2022-02-19 ENCOUNTER — Other Ambulatory Visit (HOSPITAL_COMMUNITY): Payer: Self-pay

## 2022-02-19 ENCOUNTER — Other Ambulatory Visit: Payer: Self-pay | Admitting: Oncology

## 2022-02-19 DIAGNOSIS — C641 Malignant neoplasm of right kidney, except renal pelvis: Secondary | ICD-10-CM

## 2022-02-19 MED ORDER — CABOMETYX 20 MG PO TABS
20.0000 mg | ORAL_TABLET | Freq: Every day | ORAL | 1 refills | Status: DC
  Filled 2022-02-19: qty 30, 30d supply, fill #0
  Filled 2022-03-24: qty 30, 30d supply, fill #1

## 2022-02-23 ENCOUNTER — Other Ambulatory Visit: Payer: Self-pay

## 2022-02-24 ENCOUNTER — Ambulatory Visit: Payer: Medicare HMO | Admitting: Nurse Practitioner

## 2022-03-02 ENCOUNTER — Ambulatory Visit (INDEPENDENT_AMBULATORY_CARE_PROVIDER_SITE_OTHER): Payer: Medicare HMO

## 2022-03-02 ENCOUNTER — Other Ambulatory Visit: Payer: Self-pay

## 2022-03-02 ENCOUNTER — Other Ambulatory Visit (HOSPITAL_COMMUNITY): Payer: Self-pay

## 2022-03-02 DIAGNOSIS — R6884 Jaw pain: Secondary | ICD-10-CM

## 2022-03-02 DIAGNOSIS — I5030 Unspecified diastolic (congestive) heart failure: Secondary | ICD-10-CM

## 2022-03-02 LAB — ECHOCARDIOGRAM COMPLETE
Area-P 1/2: 3.21 cm2
S' Lateral: 2.09 cm

## 2022-03-05 DIAGNOSIS — M279 Disease of jaws, unspecified: Secondary | ICD-10-CM | POA: Diagnosis not present

## 2022-03-05 DIAGNOSIS — R6884 Jaw pain: Secondary | ICD-10-CM | POA: Diagnosis not present

## 2022-03-20 ENCOUNTER — Other Ambulatory Visit: Payer: Self-pay | Admitting: *Deleted

## 2022-03-24 ENCOUNTER — Other Ambulatory Visit (HOSPITAL_COMMUNITY): Payer: Self-pay

## 2022-03-26 ENCOUNTER — Ambulatory Visit: Payer: Medicare HMO | Attending: Cardiovascular Disease | Admitting: Cardiovascular Disease

## 2022-03-26 ENCOUNTER — Encounter: Payer: Self-pay | Admitting: Cardiovascular Disease

## 2022-03-26 ENCOUNTER — Other Ambulatory Visit: Payer: Self-pay

## 2022-03-26 VITALS — BP 154/76 | HR 72 | Ht 68.0 in | Wt 170.6 lb

## 2022-03-26 DIAGNOSIS — E785 Hyperlipidemia, unspecified: Secondary | ICD-10-CM

## 2022-03-26 DIAGNOSIS — I2510 Atherosclerotic heart disease of native coronary artery without angina pectoris: Secondary | ICD-10-CM | POA: Diagnosis not present

## 2022-03-26 DIAGNOSIS — E7800 Pure hypercholesterolemia, unspecified: Secondary | ICD-10-CM

## 2022-03-26 DIAGNOSIS — R6884 Jaw pain: Secondary | ICD-10-CM

## 2022-03-26 DIAGNOSIS — E2740 Unspecified adrenocortical insufficiency: Secondary | ICD-10-CM

## 2022-03-26 DIAGNOSIS — I700 Atherosclerosis of aorta: Secondary | ICD-10-CM

## 2022-03-26 LAB — LIPID PANEL
Chol/HDL Ratio: 3.4 ratio (ref 0.0–5.0)
Cholesterol, Total: 153 mg/dL (ref 100–199)
HDL: 45 mg/dL (ref >39)
LDL Chol Calc (NIH): 86 mg/dL (ref 0–99)
Triglycerides: 123 mg/dL (ref 0–149)
VLDL Cholesterol Cal: 22 mg/dL (ref 5–40)

## 2022-03-26 MED ORDER — DILTIAZEM HCL ER COATED BEADS 240 MG PO CP24: 240.0000 mg | ORAL_CAPSULE | Freq: Every morning | ORAL | 3 refills | Status: DC

## 2022-03-26 NOTE — Patient Instructions (Signed)
Medication Instructions:  INCREASE diltiazem to 240 mg daily  *If you need a refill on your cardiac medications before your next appointment, please call your pharmacy*  Lab Work: Lipid today  If you have labs (blood work) drawn today and your tests are completely normal, you will receive your results only by: [place] (if you have MyChart) OR A paper copy in the mail If you have any lab test that is abnormal or we need to change your treatment, we will call you to review the results.  Follow-Up: At [facility], you and your health needs are our priority.  As part of our continuing mission to provide you with exceptional heart care, we have created designated Provider Care Teams.  These Care Teams include your primary Cardiologist (physician) and Advanced Practice Providers (APPs -  Physician Assistants and Nurse Practitioners) who all work together to provide you with the care you need, when you need it.  We recommend signing up for the patient portal called "MyChart".  Sign up information is provided on this After Visit Summary.  MyChart is used to connect with patients for Virtual Visits (Telemedicine).  Patients are able to view lab/test results, encounter notes, upcoming appointments, etc.  Non-urgent messages can be sent to your provider as well.   To learn more about what you can do with MyChart, go to [email or web address].    Your next appointment:   12 month(s)  Provider:   Sanda Klein, MD

## 2022-03-26 NOTE — Progress Notes (Signed)
Cardiology Office Note:    Date:  03/27/2022   ID:  [name], DOB [birth year], MRN [id number]  PCP:  [name], MD  Cardiologist:  Sanda Klein, MD  Electrophysiologist:  None   Referring MD: [name], *   Chief Complaint  Patient presents with   Jaw Pain    History of Present Illness:    [name] is a 77 y.o. male with a hx of coronary artery disease (occlusion RCA with left-to-right collaterals, occluded first diagonal, 80% stenosis ramus intermedius, 50% stenosis proximal RCA, minor irregularities left main and LAD by cardiac catheterization May 2010), normal perfusion and normal systolic function by nuclear stress test April 2018, hypertension, hyperlipidemia, frequent PVCs, small to moderate pericardial effusion (resolved), history of right lung lower lobectomy for lung cancer and history of right nephrectomy for clear cell kidney cancer, recently diagnosed with adrenal insufficiency and hypothyroidism, suspected panhypopituitarism.  He continues to hold his own against widely metastatic clear-cell renal cancer with involvement of the lung, liver, omentum, retrocaval lymph nodes s/p radiation therapy targeted to abdominal lymphadenopathy.  He was treated with Opdivo-year Voora for 4 cycles but developed adrenal insufficiency and panhypopituitarism.  He is currently on Cabometyx since March 2023.  For the last several months his biggest complaint has been right-sided jaw pain.  It started after an episode of sinusitis but has not abated.  It was very severe after he ate steak and had to chew a lot.  Questionable whether he was temporomandibular joint arthritis.  He has seen a dentist 3 times for it.  He is also scheduled to see one of our vascular surgeons, Dr. Carlis Abbott next week since he has known carotid disease.  Interestingly, he had an ESR that was severely elevated at 93 in December.  He was prescribed prednisone 20 mg once daily and had some improvement, but  not resolution of the jaw discomfort.  CT of the chest abdomen and pelvis shows extensive atherosclerosis in coronaries, aorta, aortic arch and abdominal and iliac vessels.  None of these are symptomatic.  He is not on lipid lowering therapy due to severe muscle weakness with statins. We chose to be less aggressive with lipid lowering therapy since his long term oncology prognosis appeared to be poor (which is no longer so clearly the case).  Past Medical History:  Diagnosis Date   Arthritis    Chronic kidney disease    only has one kidney    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 02/01/2015   Coronary artery disease    followed by Dr.Tilley   Frequent PVCs    GERD (gastroesophageal reflux disease)    Heart murmur    Hx of cancer of lung 1980's   Hyperlipidemia    Hypertension    Incisional hernia 08/01/2015   Lung cancer ([place]) 1993   Lung metastases 2019   Recurrent umbilical hernia 82/95/6213    Past Surgical History:  Procedure Laterality Date   APPENDECTOMY  age 24   CHOLECYSTECTOMY  3's   LAPAROSCOPIC LYSIS OF ADHESIONS N/A 08/01/2015   Procedure: LAPAROSCOPIC LYSIS OF ADHESIONS;  Surgeon: Michael Boston, MD;  Location: [facility];  Service: General;  Laterality: N/A;   LUNG LOBECTOMY  1992   lung cancer- patient has staples in lung not to have MRI per patient    PILONIDAL CYST EXCISION  2011   Dr Harlow Asa   ROBOTIC ASSITED PARTIAL NEPHRECTOMY Right 02/01/2015   Procedure: RIGHT ROBOTIC ASSISTED LAPAROSOCOPY  NEPHRECTOMY;  Surgeon: Ardis Hughs, MD;  Location: [facility];  Service: Urology;  Laterality: Right;   VENTRAL HERNIA REPAIR N/A 08/01/2015   Procedure: LAPAROSCOPIC VENTRAL WALL HERNIA WITH MESH;  Surgeon: Michael Boston, MD;  Location: [facility];  Service: General;  Laterality: N/A;    Current Medications: Current Meds  Medication Sig   ALLERGY RELIEF 10 MG tablet Take 10 mg by mouth daily as needed.   benzonatate (TESSALON) 100 MG capsule  Take 1 capsule (100 mg total) by mouth 3 (three) times daily as needed for cough.   cabozantinib (CABOMETYX) 20 MG tablet Take 1 tablet (20 mg total) by mouth daily. Take on an empty stomach, 1 hour before or 2 hours after meals.   Cholecalciferol (VITAMIN D3) 50 MCG (2000 UT) TABS Take 2,000 Units by mouth daily.   ELIQUIS 5 MG TABS tablet Take 1 tablet (5 mg total) by mouth 2 (two) times daily.   famotidine (PEPCID) 20 MG tablet Take 20 mg by mouth See admin instructions. Take 20 mg by mouth at 3 PM daily and an additional 20 mg once a day as needed for heartburn or indigestion   hydrocortisone (CORTEF) 10 MG tablet Please take 10 mg in am and 5 mg in PM. Please allow for EMG dosing   levothyroxine (SYNTHROID) 150 MCG tablet TAKE 1 TABLET BY MOUTH EVERY OTHER DAY   levothyroxine (SYNTHROID) 175 MCG tablet TAKE 1 TABLET BY MOUTH EVERY OTHER DAY   Multiple Vitamins-Minerals (CENTRUM SILVER 50+MEN) TABS Take 1 tablet by mouth daily with breakfast.   predniSONE (DELTASONE) 20 MG tablet Take 20 mg by mouth every morning.   traZODone (DESYREL) 150 MG tablet Take 150 mg by mouth at bedtime as needed for sleep.   [DISCONTINUED] diltiazem (CARDIZEM CD) 120 MG 24 hr capsule Take 120 mg by mouth in the morning.     Allergies:   Diphenhydramine   Social History   Socioeconomic History   Marital status: Widowed    Spouse name: Not on file   Number of children: Not on file   Years of education: Not on file   Highest education level: Not on file  Occupational History   Occupation: Retired  Tobacco Use   Smoking status: Former    Packs/day: 1.00    Years: 20.00    Total pack years: 20.00    Types: Cigarettes    Quit date: 01/29/1988    Years since quitting: 34.1   Smokeless tobacco: Never  Substance and Sexual Activity   Alcohol use: Yes    Comment: rare   Drug use: No   Sexual activity: Not on file  Other Topics Concern   Not on file  Social History Narrative   Not on file   Social  Determinants of Health   Financial Resource Strain: Not on file  Food Insecurity: Not on file  Transportation Needs: Not on file  Physical Activity: Not on file  Stress: Not on file  Social Connections: Not on file     Family History: The patient's 57 of 5 brothers died of cancer and 1 of liver disease.  His father had coronary artery disease and his mother had Parkinson's disease ROS:   Please see the history of present illness.    All other systems are reviewed and are negative.  EKGs/Labs/Other Studies Reviewed:    The following studies were reviewed today:  Echocardiogram 03/02/2022    1. Challenging windows. Left ventricular ejection fraction, by  estimation, is  60 to 65%. The left ventricle has normal function. The left  ventricle has no regional wall motion abnormalities. Left ventricular  diastolic parameters are consistent with  Grade I diastolic dysfunction (impaired relaxation).   2. Right ventricular systolic function is normal. The right ventricular  size is normal. Tricuspid regurgitation signal is inadequate for assessing  PA pressure.   3. No evidence of mitral valve regurgitation.   4. The aortic valve was not well visualized. Aortic valve regurgitation  is not visualized.   5. The inferior vena cava is normal in size with greater than 50%  respiratory variability, suggesting right atrial pressure of 3 mmHg.   Comparison(s): No significant change from prior study.    Recent cancer center visit w Dr. Alen Blew 02/17/2022 and last CT scan  EKG:  EKG is ordered today. It is a completely normal tracing, NSR.  Recent Labs: 03/28/2021: Magnesium 2.0 07/23/2021: TSH 1.51 02/06/2022: ALT 20; BUN 21; Creatinine 1.36; Hemoglobin 11.0; Platelet Count 229; Potassium 4.2; Sodium 140  Recent Lipid Panel 12/04/16  CHOL TOTL 223 mg/dl, LDL N/A NM, HDL 19 mg/dl, TRIGLYCER 406 mg/dl and CHOL/HDL 11.9 (Calc)  Physical Exam:    VS:  BP (!) 154/76 (BP Location: Left Arm,  Patient Position: Sitting, Cuff Size: Normal)   Pulse 72   Ht 5\' 8"  (1.727 m)   Wt 170 lb 9.6 oz (77.4 kg)   SpO2 95%   BMI 25.94 kg/m     Wt Readings from Last 3 Encounters:  03/26/22 170 lb 9.6 oz (77.4 kg)  02/17/22 172 lb 11.2 oz (78.3 kg)  02/06/22 176 lb (79.8 kg)      General: Alert, oriented x3, no distress, appears fairly energetic. Head: Missing several teeth, no evidence of trauma, PERRL, EOMI, no exophtalmos or lid lag, no myxedema, no xanthelasma; normal ears, nose and oropharynx Neck: normal jugular venous pulsations and no hepatojugular reflux; brisk carotid pulses without delay and no carotid bruits Chest: clear to auscultation, no signs of consolidation by percussion or palpation, normal fremitus, symmetrical and full respiratory excursions Cardiovascular: normal position and quality of the apical impulse, regular rhythm, normal first and second heart sounds, no murmurs, rubs or gallops Abdomen: no tenderness or distention, no masses by palpation, no abnormal pulsatility or arterial bruits, normal bowel sounds, no hepatosplenomegaly Extremities: no clubbing, cyanosis or edema; 2+ radial, ulnar and brachial pulses bilaterally; 2+ right femoral, posterior tibial and dorsalis pedis pulses; 2+ left femoral, posterior tibial and dorsalis pedis pulses; no subclavian or femoral bruits Neurological: grossly nonfocal Psych: Normal mood and affect    ASSESSMENT:    1. Coronary artery disease involving native coronary artery of native heart without angina pectoris   2. Pure hypercholesterolemia   3. Atherosclerosis of aorta ([place])   4. Adrenal insufficiency (HCC)   5. Jaw pain     PLAN:    In order of problems listed above:   CAD: On low-dose aspirin.  He does not have angina pectoris at rest or with activity. I do not think he has angina pectoris with radiation of discomfort to the jaw.  His previous angina was with retrosternal discomfort.  His current jaw discomfort  is not worsened by walking or other types of exertion but is worsened by chewing.  He is statin intolerant after trying multiple different agents which all caused intolerable myopathy.  Currently not on lipid-lowering medications.  Aortic atherosclerosis: Mild ectasia of the ascending aorta at 3.9 cm.  No intervention or periodic evaluation  is planned at this point. HLP:  Need to update his lipid profile.  If his LDL is close to target will use ezetimibe monotherapy.  Otherwise we will use PCSK9 inhibitors.  He has lost quite a bit of weight and it is possible that his lipid profile is much better.  It is also clear that the prognosis from his renal cell carcinoma has not been as dire as might have initially been expected in his longevity may depend just as much on his heart problems as on his malignancy. Adrenal insufficiency/hypothyroidism: seeing Endocrinology, Dr. Cruzita Lederer. Would need stress dose steroids w serious illnesses Left-sided jaw pain: I wonder if he is actually describing jaw claudication.  Coupled with his markedly elevated ESR and with his history of treatment with immune checkpoint inhibitors, it is conceivable that he may have developed giant cell arteritis.  He also has a mild headache.  He does not have any visual problems.  Jaw claudication may also be related to atherosclerotic carotid disease, but the duplex ultrasound performed about 6 months ago did not show any significant disease in the common carotid arteries or the internal carotid arteries.  There is no mention of severe external carotid artery disease.Marland Kitchen  He will be seeing Dr. Carlis Abbott next week.  Finally, need to consider possibly slowly progressing osteonecrosis of the jaw which can be seen with his current chemotherapy agent Cabometyx.     Medication Adjustments/Labs and Tests Ordered: Current medicines are reviewed at length with the patient today.  Concerns regarding medicines are outlined above.  Orders Placed This  Encounter  Procedures   Lipid panel   EKG 12-Lead   Meds ordered this encounter  Medications   diltiazem (CARDIZEM CD) 240 MG 24 hr capsule    Sig: Take 1 capsule (240 mg total) by mouth in the morning.    Dispense:  90 capsule    Refill:  3    Dose increase    Patient Instructions  Medication Instructions:  INCREASE diltiazem to 240 mg daily  *If you need a refill on your cardiac medications before your next appointment, please call your pharmacy*  Lab Work: Lipid today  If you have labs (blood work) drawn today and your tests are completely normal, you will receive your results only by: Arnold Line (if you have MyChart) OR A paper copy in the mail If you have any lab test that is abnormal or we need to change your treatment, we will call you to review the results.  Follow-Up: At [facility], you and your health needs are our priority.  As part of our continuing mission to provide you with exceptional heart care, we have created designated Provider Care Teams.  These Care Teams include your primary Cardiologist (physician) and Advanced Practice Providers (APPs -  Physician Assistants and Nurse Practitioners) who all work together to provide you with the care you need, when you need it.  We recommend signing up for the patient portal called "MyChart".  Sign up information is provided on this After Visit Summary.  MyChart is used to connect with patients for Virtual Visits (Telemedicine).  Patients are able to view lab/test results, encounter notes, upcoming appointments, etc.  Non-urgent messages can be sent to your provider as well.   To learn more about what you can do with MyChart, go to [email or web address].    Your next appointment:   12 month(s)  Provider:   Sanda Klein, MD        Signed, Dani Gobble  Kyera Felan, MD  03/27/2022 10:23 PM    [place]

## 2022-03-27 ENCOUNTER — Telehealth: Payer: Self-pay | Admitting: *Deleted

## 2022-03-27 DIAGNOSIS — E785 Hyperlipidemia, unspecified: Secondary | ICD-10-CM

## 2022-03-27 MED ORDER — EZETIMIBE 10 MG PO TABS: 10.0000 mg | ORAL_TABLET | Freq: Every day | ORAL | 3 refills | Status: DC

## 2022-03-27 NOTE — Telephone Encounter (Signed)
Spoke with pt daughter, aware of the recommendations. New script sent to the pharmacy  Lab orders mailed to the pt

## 2022-03-27 NOTE — Telephone Encounter (Signed)
-----   Message from Sanda Klein, MD sent at 03/26/2022  4:50 PM EST ----- Cholesterol is not that bad. Better than in the past, probably due to weight loss.We may not need to use the expensive injections (Repatha) and we can use ezetimibe 10 mg daily, that I hope will not cause muscle side effects. Recheck lipid profile in 2 months.

## 2022-03-31 ENCOUNTER — Encounter: Payer: Self-pay | Admitting: Vascular Surgery

## 2022-03-31 ENCOUNTER — Ambulatory Visit: Payer: Medicare HMO | Admitting: Vascular Surgery

## 2022-03-31 ENCOUNTER — Encounter (HOSPITAL_COMMUNITY): Payer: Medicare HMO

## 2022-03-31 VITALS — BP 127/67 | HR 80 | Temp 98.1°F | Resp 16 | Ht 68.0 in | Wt 174.0 lb

## 2022-03-31 DIAGNOSIS — R6884 Jaw pain: Secondary | ICD-10-CM | POA: Diagnosis not present

## 2022-03-31 NOTE — H&P (View-Only) (Signed)
Patient name: [name] MRN: [id number] DOB: Jan 27, 1946 Sex: male  REASON FOR CONSULT: Temporal arteritis, temporal artery biopsy  HPI: [name] is a 77 y.o. male, with history of chronic kidney disease, renal cell carcinoma status post nephrectomy, lung cancer, hypertension, hyperlipidemia presents for evaluation of possible temporal arteritis.  He states about 5 months ago he started having left jaw pain.  This has also been associated with some mild headaches as well as some discomfort and tenderness around his left eye.  He did have an elevated sedimentation rate that was 75.  He has been started on high dose steroids by Dr. Arelia Sneddon.  He previously took low-dose steroids for chronic adrenal insufficiency.  Past Medical History:  Diagnosis Date   Arthritis    Chronic kidney disease    only has one kidney    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 02/01/2015   Coronary artery disease    followed by Dr.Tilley   Frequent PVCs    GERD (gastroesophageal reflux disease)    Heart murmur    Hx of cancer of lung 1980's   Hyperlipidemia    Hypertension    Incisional hernia 08/01/2015   Lung cancer ([place]) 1993   Lung metastases 2019   Recurrent umbilical hernia 21/30/8657    Past Surgical History:  Procedure Laterality Date   APPENDECTOMY  age 36   CHOLECYSTECTOMY  59's   LAPAROSCOPIC LYSIS OF ADHESIONS N/A 08/01/2015   Procedure: LAPAROSCOPIC LYSIS OF ADHESIONS;  Surgeon: Michael Boston, MD;  Location: [facility];  Service: General;  Laterality: N/A;   LUNG LOBECTOMY  1992   lung cancer- patient has staples in lung not to have MRI per patient    PILONIDAL CYST EXCISION  2011   Dr Harlow Asa   ROBOTIC ASSITED PARTIAL NEPHRECTOMY Right 02/01/2015   Procedure: RIGHT ROBOTIC ASSISTED LAPAROSOCOPY NEPHRECTOMY;  Surgeon: Ardis Hughs, MD;  Location: [facility];  Service: Urology;  Laterality: Right;   VENTRAL HERNIA REPAIR N/A 08/01/2015   Procedure: LAPAROSCOPIC  VENTRAL WALL HERNIA WITH MESH;  Surgeon: Michael Boston, MD;  Location: [facility];  Service: General;  Laterality: N/A;    Family History  Problem Relation Age of Onset   Heart attack Father     SOCIAL HISTORY: Social History   Socioeconomic History   Marital status: Widowed    Spouse name: Not on file   Number of children: Not on file   Years of education: Not on file   Highest education level: Not on file  Occupational History   Occupation: Retired  Tobacco Use   Smoking status: Former    Packs/day: 1.00    Years: 20.00    Total pack years: 20.00    Types: Cigarettes    Quit date: 01/29/1988    Years since quitting: 34.1   Smokeless tobacco: Never  Substance and Sexual Activity   Alcohol use: Yes    Comment: rare   Drug use: No   Sexual activity: Not on file  Other Topics Concern   Not on file  Social History Narrative   Not on file   Social Determinants of Health   Financial Resource Strain: Not on file  Food Insecurity: Not on file  Transportation Needs: Not on file  Physical Activity: Not on file  Stress: Not on file  Social Connections: Not on file  Intimate Partner Violence: Not on file    Allergies  Allergen Reactions   Diphenhydramine Other (See Comments)  Severe restless legs    Current Outpatient Medications  Medication Sig Dispense Refill   acetaminophen (TYLENOL) 325 MG tablet Take 2 tablets (650 mg total) by mouth every 6 (six) hours as needed for mild pain or headache (over the counter). 30 tablet 3   ALLERGY RELIEF 10 MG tablet Take 10 mg by mouth daily as needed.     benzonatate (TESSALON) 100 MG capsule Take 1 capsule (100 mg total) by mouth 3 (three) times daily as needed for cough. 20 capsule 0   cabozantinib (CABOMETYX) 20 MG tablet Take 1 tablet (20 mg total) by mouth daily. Take on an empty stomach, 1 hour before or 2 hours after meals. 30 tablet 1   Cholecalciferol (VITAMIN D3) 50 MCG (2000 UT) TABS Take 2,000 Units by mouth daily.      diltiazem (CARDIZEM CD) 240 MG 24 hr capsule Take 1 capsule (240 mg total) by mouth in the morning. 90 capsule 3   ELIQUIS 5 MG TABS tablet Take 1 tablet (5 mg total) by mouth 2 (two) times daily. 60 tablet 2   ezetimibe (ZETIA) 10 MG tablet Take 1 tablet (10 mg total) by mouth daily. 90 tablet 3   famotidine (PEPCID) 20 MG tablet Take 20 mg by mouth See admin instructions. Take 20 mg by mouth at 3 PM daily and an additional 20 mg once a day as needed for heartburn or indigestion     fluticasone (FLONASE) 50 MCG/ACT nasal spray Place 2 sprays into both nostrils daily.     guaiFENesin-dextromethorphan (ROBITUSSIN DM) 100-10 MG/5ML syrup Take 5 mLs by mouth every 4 (four) hours as needed for cough. 118 mL 0   hydrocortisone (CORTEF) 10 MG tablet Please take 10 mg in am and 5 mg in PM. Please allow for EMG dosing 160 tablet 3   levothyroxine (SYNTHROID) 150 MCG tablet TAKE 1 TABLET BY MOUTH EVERY OTHER DAY 30 tablet 3   levothyroxine (SYNTHROID) 175 MCG tablet TAKE 1 TABLET BY MOUTH EVERY OTHER DAY 30 tablet 3   Multiple Vitamins-Minerals (CENTRUM SILVER 50+MEN) TABS Take 1 tablet by mouth daily with breakfast.     predniSONE (DELTASONE) 20 MG tablet Take 20 mg by mouth every morning.     traZODone (DESYREL) 150 MG tablet Take 150 mg by mouth at bedtime as needed for sleep.     temazepam (RESTORIL) 7.5 MG capsule Take 1 capsule (7.5 mg total) by mouth at bedtime as needed for sleep. (Patient not taking: Reported on 03/26/2022) 30 capsule 0   No current facility-administered medications for this visit.    REVIEW OF SYSTEMS:  [X]  denotes positive finding, [ ]  denotes negative finding Cardiac  Comments:  Chest pain or chest pressure:    Shortness of breath upon exertion:    Short of breath when lying flat:    Irregular heart rhythm:        Vascular    Pain in calf, thigh, or hip brought on by ambulation:    Pain in feet at night that wakes you up from your sleep:     Blood clot in your veins:     Leg swelling:         Pulmonary    Oxygen at home:    Productive cough:     Wheezing:         Neurologic    Sudden weakness in arms or legs:     Sudden numbness in arms or legs:     Sudden onset of  difficulty speaking or slurred speech:    Temporary loss of vision in one eye:     Problems with dizziness:         Gastrointestinal    Blood in stool:     Vomited blood:         Genitourinary    Burning when urinating:     Blood in urine:        Psychiatric    Major depression:         Hematologic    Bleeding problems:    Problems with blood clotting too easily:        Skin    Rashes or ulcers:        Constitutional    Fever or chills:      PHYSICAL EXAM: Vitals:   03/31/22 1204  BP: 127/67  Pulse: 80  Resp: 16  Temp: 98.1 F (36.7 C)  TempSrc: Temporal  SpO2: 93%  Weight: 174 lb (78.9 kg)  Height: 5\' 8"  (1.727 m)    GENERAL: The patient is a well-nourished male, in no acute distress. The vital signs are documented above. CARDIAC: There is a regular rate and rhythm.  VASCULAR:  Left temporal artery Doppler signal brisk Tenderness over the left temporal region PULMONARY: No respiratory distress. ABDOMEN: Soft and non-tender. MUSCULOSKELETAL: There are no major deformities or cyanosis. NEUROLOGIC: No focal weakness or paresthesias are detected. SKIN: There are no ulcers or rashes noted. PSYCHIATRIC: The patient has a normal affect.  DATA:   N/A  Assessment/Plan:  77 y.o. male, with history of chronic kidney disease, renal cell carcinoma status post nephrectomy, lung cancer, hypertension, hyperlipidemia presents for evaluation of possible temporal arteritis.  I have recommended a left temporal artery biopsy to rule out temporal arteritis as his symptoms are consistent with temporal arteritis.  He does endorse left jaw pain along with tenderness over the left temporal region and some eye pain and mild headaches.  Ultimately discussed there would be  long-term risks to high dose steroid use if he indeed does not have temporal arteritis.  I discussed this being done in the operating room [facility] as an outpatient with local and some monitored anesthesia care.  I offered to do this Friday or Monday but he is going to have this done next Friday due to transportation issues with his daughter out of town.  Risk and benefits discussed.   Marty Heck, MD [facility] Office: [phone]

## 2022-03-31 NOTE — Progress Notes (Signed)
Patient name: [name] MRN: [id number] DOB: Jan 27, 1946 Sex: male  REASON FOR CONSULT: Temporal arteritis, temporal artery biopsy  HPI: [name] is a 77 y.o. male, with history of chronic kidney disease, renal cell carcinoma status post nephrectomy, lung cancer, hypertension, hyperlipidemia presents for evaluation of possible temporal arteritis.  He states about 5 months ago he started having left jaw pain.  This has also been associated with some mild headaches as well as some discomfort and tenderness around his left eye.  He did have an elevated sedimentation rate that was 75.  He has been started on high dose steroids by Dr. Arelia Sneddon.  He previously took low-dose steroids for chronic adrenal insufficiency.  Past Medical History:  Diagnosis Date   Arthritis    Chronic kidney disease    only has one kidney    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 02/01/2015   Coronary artery disease    followed by Dr.Tilley   Frequent PVCs    GERD (gastroesophageal reflux disease)    Heart murmur    Hx of cancer of lung 1980's   Hyperlipidemia    Hypertension    Incisional hernia 08/01/2015   Lung cancer (August) 1993   Lung metastases 2019   Recurrent umbilical hernia 21/30/8657    Past Surgical History:  Procedure Laterality Date   APPENDECTOMY  age 36   CHOLECYSTECTOMY  59's   LAPAROSCOPIC LYSIS OF ADHESIONS N/A 08/01/2015   Procedure: LAPAROSCOPIC LYSIS OF ADHESIONS;  Surgeon: Michael Boston, MD;  Location: [facility];  Service: General;  Laterality: N/A;   LUNG LOBECTOMY  1992   lung cancer- patient has staples in lung not to have MRI per patient    PILONIDAL CYST EXCISION  2011   Dr Harlow Asa   ROBOTIC ASSITED PARTIAL NEPHRECTOMY Right 02/01/2015   Procedure: RIGHT ROBOTIC ASSISTED LAPAROSOCOPY NEPHRECTOMY;  Surgeon: Ardis Hughs, MD;  Location: [facility];  Service: Urology;  Laterality: Right;   VENTRAL HERNIA REPAIR N/A 08/01/2015   Procedure: LAPAROSCOPIC  VENTRAL WALL HERNIA WITH MESH;  Surgeon: Michael Boston, MD;  Location: [facility];  Service: General;  Laterality: N/A;    Family History  Problem Relation Age of Onset   Heart attack Father     SOCIAL HISTORY: Social History   Socioeconomic History   Marital status: Widowed    Spouse name: Not on file   Number of children: Not on file   Years of education: Not on file   Highest education level: Not on file  Occupational History   Occupation: Retired  Tobacco Use   Smoking status: Former    Packs/day: 1.00    Years: 20.00    Total pack years: 20.00    Types: Cigarettes    Quit date: 01/29/1988    Years since quitting: 34.1   Smokeless tobacco: Never  Substance and Sexual Activity   Alcohol use: Yes    Comment: rare   Drug use: No   Sexual activity: Not on file  Other Topics Concern   Not on file  Social History Narrative   Not on file   Social Determinants of Health   Financial Resource Strain: Not on file  Food Insecurity: Not on file  Transportation Needs: Not on file  Physical Activity: Not on file  Stress: Not on file  Social Connections: Not on file  Intimate Partner Violence: Not on file    Allergies  Allergen Reactions   Diphenhydramine Other (See Comments)  Severe restless legs    Current Outpatient Medications  Medication Sig Dispense Refill   acetaminophen (TYLENOL) 325 MG tablet Take 2 tablets (650 mg total) by mouth every 6 (six) hours as needed for mild pain or headache (over the counter). 30 tablet 3   ALLERGY RELIEF 10 MG tablet Take 10 mg by mouth daily as needed.     benzonatate (TESSALON) 100 MG capsule Take 1 capsule (100 mg total) by mouth 3 (three) times daily as needed for cough. 20 capsule 0   cabozantinib (CABOMETYX) 20 MG tablet Take 1 tablet (20 mg total) by mouth daily. Take on an empty stomach, 1 hour before or 2 hours after meals. 30 tablet 1   Cholecalciferol (VITAMIN D3) 50 MCG (2000 UT) TABS Take 2,000 Units by mouth daily.      diltiazem (CARDIZEM CD) 240 MG 24 hr capsule Take 1 capsule (240 mg total) by mouth in the morning. 90 capsule 3   ELIQUIS 5 MG TABS tablet Take 1 tablet (5 mg total) by mouth 2 (two) times daily. 60 tablet 2   ezetimibe (ZETIA) 10 MG tablet Take 1 tablet (10 mg total) by mouth daily. 90 tablet 3   famotidine (PEPCID) 20 MG tablet Take 20 mg by mouth See admin instructions. Take 20 mg by mouth at 3 PM daily and an additional 20 mg once a day as needed for heartburn or indigestion     fluticasone (FLONASE) 50 MCG/ACT nasal spray Place 2 sprays into both nostrils daily.     guaiFENesin-dextromethorphan (ROBITUSSIN DM) 100-10 MG/5ML syrup Take 5 mLs by mouth every 4 (four) hours as needed for cough. 118 mL 0   hydrocortisone (CORTEF) 10 MG tablet Please take 10 mg in am and 5 mg in PM. Please allow for EMG dosing 160 tablet 3   levothyroxine (SYNTHROID) 150 MCG tablet TAKE 1 TABLET BY MOUTH EVERY OTHER DAY 30 tablet 3   levothyroxine (SYNTHROID) 175 MCG tablet TAKE 1 TABLET BY MOUTH EVERY OTHER DAY 30 tablet 3   Multiple Vitamins-Minerals (CENTRUM SILVER 50+MEN) TABS Take 1 tablet by mouth daily with breakfast.     predniSONE (DELTASONE) 20 MG tablet Take 20 mg by mouth every morning.     traZODone (DESYREL) 150 MG tablet Take 150 mg by mouth at bedtime as needed for sleep.     temazepam (RESTORIL) 7.5 MG capsule Take 1 capsule (7.5 mg total) by mouth at bedtime as needed for sleep. (Patient not taking: Reported on 03/26/2022) 30 capsule 0   No current facility-administered medications for this visit.    REVIEW OF SYSTEMS:  [X]  denotes positive finding, [ ]  denotes negative finding Cardiac  Comments:  Chest pain or chest pressure:    Shortness of breath upon exertion:    Short of breath when lying flat:    Irregular heart rhythm:        Vascular    Pain in calf, thigh, or hip brought on by ambulation:    Pain in feet at night that wakes you up from your sleep:     Blood clot in your veins:     Leg swelling:         Pulmonary    Oxygen at home:    Productive cough:     Wheezing:         Neurologic    Sudden weakness in arms or legs:     Sudden numbness in arms or legs:     Sudden onset of  difficulty speaking or slurred speech:    Temporary loss of vision in one eye:     Problems with dizziness:         Gastrointestinal    Blood in stool:     Vomited blood:         Genitourinary    Burning when urinating:     Blood in urine:        Psychiatric    Major depression:         Hematologic    Bleeding problems:    Problems with blood clotting too easily:        Skin    Rashes or ulcers:        Constitutional    Fever or chills:      PHYSICAL EXAM: Vitals:   03/31/22 1204  BP: 127/67  Pulse: 80  Resp: 16  Temp: 98.1 F (36.7 C)  TempSrc: Temporal  SpO2: 93%  Weight: 174 lb (78.9 kg)  Height: 5\' 8"  (1.727 m)    GENERAL: The patient is a well-nourished male, in no acute distress. The vital signs are documented above. CARDIAC: There is a regular rate and rhythm.  VASCULAR:  Left temporal artery Doppler signal brisk Tenderness over the left temporal region PULMONARY: No respiratory distress. ABDOMEN: Soft and non-tender. MUSCULOSKELETAL: There are no major deformities or cyanosis. NEUROLOGIC: No focal weakness or paresthesias are detected. SKIN: There are no ulcers or rashes noted. PSYCHIATRIC: The patient has a normal affect.  DATA:   N/A  Assessment/Plan:  77 y.o. male, with history of chronic kidney disease, renal cell carcinoma status post nephrectomy, lung cancer, hypertension, hyperlipidemia presents for evaluation of possible temporal arteritis.  I have recommended a left temporal artery biopsy to rule out temporal arteritis as his symptoms are consistent with temporal arteritis.  He does endorse left jaw pain along with tenderness over the left temporal region and some eye pain and mild headaches.  Ultimately discussed there would be  long-term risks to high dose steroid use if he indeed does not have temporal arteritis.  I discussed this being done in the operating room [facility] as an outpatient with local and some monitored anesthesia care.  I offered to do this Friday or Monday but he is going to have this done next Friday due to transportation issues with his daughter out of town.  Risk and benefits discussed.   Marty Heck, MD [facility] Office: [phone]

## 2022-04-01 ENCOUNTER — Other Ambulatory Visit: Payer: Self-pay

## 2022-04-01 DIAGNOSIS — R6884 Jaw pain: Secondary | ICD-10-CM

## 2022-04-06 NOTE — Pre-Procedure Instructions (Signed)
Surgical Instructions    Your procedure is scheduled on April 10, 2022.  Report to [facility] Main Entrance "A" at 7:30 A.M., then check in with the Admitting office.  Call this number if you have problems the morning of surgery:  [phone]  If you have any questions prior to your surgery date call [phone]: Open Monday-Friday 8am-4pm If you experience any cold or flu symptoms such as cough, fever, chills, shortness of breath, etc. between now and your scheduled surgery, please notify us at the above number.     Remember:  Do not eat or drink after midnight the night before your surgery      Take these medicines the morning of surgery with A SIP OF WATER:  cabozantinib (CABOMETYX)   diltiazem (CARDIZEM CD)   ezetimibe (ZETIA)   hydrocortisone (CORTEF)   levothyroxine (SYNTHROID)   predniSONE (DELTASONE)    May take these medicines IF NEEDED:  acetaminophen (TYLENOL)   famotidine (PEPCID)   hydrOXYzine (ATARAX)    Follow your surgeon's instructions on when to stop ELIQUIS.  If no instructions were given by your surgeon then you will need to call the office to get those instructions.     As of today, STOP taking any Aspirin (unless otherwise instructed by your surgeon) Aleve, Naproxen, Ibuprofen, Motrin, Advil, Goody's, BC's, all herbal medications, fish oil, and all vitamins.                     Do NOT Smoke (Tobacco/Vaping) for 24 hours prior to your procedure.  If you use a CPAP at night, you may bring your mask/headgear for your overnight stay.   Contacts, glasses, piercing's, hearing aid's, dentures or partials may not be worn into surgery, please bring cases for these belongings.    For patients admitted to the hospital, discharge time will be determined by your treatment team.   Patients discharged the day of surgery will not be allowed to drive home, and someone needs to stay with them for 24 hours.  SURGICAL WAITING ROOM VISITATION Patients having  surgery or a procedure may have no more than 2 support people in the waiting area - these visitors may rotate.   Children under the age of 69 must have an adult with them who is not the patient. If the patient needs to stay at the hospital during part of their recovery, the visitor guidelines for inpatient rooms apply. Pre-op nurse will coordinate an appropriate time for 1 support person to accompany patient in pre-op.  This support person may not rotate.   Please refer to the [facility] website for the visitor guidelines for Inpatients (after your surgery is over and you are in a regular room).    Special instructions:   [place]- Preparing For Surgery  Before surgery, you can play an important role. Because skin is not sterile, your skin needs to be as free of germs as possible. You can reduce the number of germs on your skin by washing with CHG (chlorahexidine gluconate) Soap before surgery.  CHG is an antiseptic cleaner which kills germs and bonds with the skin to continue killing germs even after washing.    Oral Hygiene is also important to reduce your risk of infection.  Remember - BRUSH YOUR TEETH THE MORNING OF SURGERY WITH YOUR REGULAR TOOTHPASTE  Please do not use if you have an allergy to CHG or antibacterial soaps. If your skin becomes reddened/irritated stop using the CHG.  Do not shave (including  legs and underarms) for at least 48 hours prior to first CHG shower. It is OK to shave your face.  Please follow these instructions carefully.   Shower the NIGHT BEFORE SURGERY and the MORNING OF SURGERY  If you chose to wash your hair, wash your hair first as usual with your normal shampoo.  After you shampoo, rinse your hair and body thoroughly to remove the shampoo.  Use CHG Soap as you would any other liquid soap. You can apply CHG directly to the skin and wash gently with a scrungie or a clean washcloth.   Apply the CHG Soap to your body ONLY FROM THE NECK DOWN.  Do not use  on open wounds or open sores. Avoid contact with your eyes, ears, mouth and genitals (private parts). Wash Face and genitals (private parts)  with your normal soap.   Wash thoroughly, paying special attention to the area where your surgery will be performed.  Thoroughly rinse your body with warm water from the neck down.  DO NOT shower/wash with your normal soap after using and rinsing off the CHG Soap.  Pat yourself dry with a CLEAN TOWEL.  Wear CLEAN PAJAMAS to bed the night before surgery  Place CLEAN SHEETS on your bed the night before your surgery  DO NOT SLEEP WITH PETS.   Day of Surgery: Take a shower with CHG soap. Do not wear jewelry or makeup Do not wear lotions, powders, perfumes/colognes, or deodorant. Do not shave 48 hours prior to surgery.  Men may shave face and neck. Do not bring valuables to the hospital.  [facility] is not responsible for any belongings or valuables. Do not wear nail polish, gel polish, artificial nails, or any other type of covering on natural nails (fingers and toes) If you have artificial nails or gel coating that need to be removed by a nail salon, please have this removed prior to surgery. Artificial nails or gel coating may interfere with anesthesia's ability to adequately monitor your vital signs.  Wear Clean/Comfortable clothing the morning of surgery Remember to brush your teeth WITH YOUR REGULAR TOOTHPASTE.   Please read over the following fact sheets that you were given.    If you received a COVID test during your pre-op visit  it is requested that you wear a mask when out in public, stay away from anyone that may not be feeling well and notify your surgeon if you develop symptoms. If you have been in contact with anyone that has tested positive in the last 10 days please notify you surgeon.

## 2022-04-07 ENCOUNTER — Inpatient Hospital Stay (HOSPITAL_COMMUNITY)
Admission: RE | Admit: 2022-04-07 | Discharge: 2022-04-07 | Disposition: A | Discharge status: Home / Self Care | Payer: Medicare HMO | Source: Ambulatory Visit

## 2022-04-07 ENCOUNTER — Other Ambulatory Visit: Payer: Self-pay

## 2022-04-07 DIAGNOSIS — C641 Malignant neoplasm of right kidney, except renal pelvis: Secondary | ICD-10-CM

## 2022-04-08 ENCOUNTER — Telehealth: Payer: Self-pay

## 2022-04-08 ENCOUNTER — Encounter (HOSPITAL_COMMUNITY): Payer: Self-pay

## 2022-04-08 ENCOUNTER — Other Ambulatory Visit: Payer: Self-pay

## 2022-04-08 ENCOUNTER — Encounter (HOSPITAL_COMMUNITY)
Admission: RE | Admit: 2022-04-08 | Discharge: 2022-04-08 | Disposition: A | Discharge status: Home / Self Care | Payer: Medicare HMO | Source: Ambulatory Visit | Attending: Vascular Surgery | Admitting: Vascular Surgery

## 2022-04-08 VITALS — BP 136/70 | HR 80 | Temp 97.7°F | Resp 18 | Ht 68.0 in | Wt 173.5 lb

## 2022-04-08 DIAGNOSIS — Z7982 Long term (current) use of aspirin: Secondary | ICD-10-CM | POA: Insufficient documentation

## 2022-04-08 DIAGNOSIS — I4892 Unspecified atrial flutter: Secondary | ICD-10-CM | POA: Insufficient documentation

## 2022-04-08 DIAGNOSIS — Z01812 Encounter for preprocedural laboratory examination: Secondary | ICD-10-CM | POA: Insufficient documentation

## 2022-04-08 DIAGNOSIS — Z01818 Encounter for other preprocedural examination: Secondary | ICD-10-CM

## 2022-04-08 DIAGNOSIS — I2510 Atherosclerotic heart disease of native coronary artery without angina pectoris: Secondary | ICD-10-CM | POA: Diagnosis not present

## 2022-04-08 DIAGNOSIS — E785 Hyperlipidemia, unspecified: Secondary | ICD-10-CM | POA: Diagnosis not present

## 2022-04-08 DIAGNOSIS — R700 Elevated erythrocyte sedimentation rate: Secondary | ICD-10-CM | POA: Diagnosis not present

## 2022-04-08 DIAGNOSIS — E230 Hypopituitarism: Secondary | ICD-10-CM | POA: Diagnosis not present

## 2022-04-08 DIAGNOSIS — I493 Ventricular premature depolarization: Secondary | ICD-10-CM | POA: Diagnosis not present

## 2022-04-08 DIAGNOSIS — E2740 Unspecified adrenocortical insufficiency: Secondary | ICD-10-CM | POA: Diagnosis not present

## 2022-04-08 DIAGNOSIS — I119 Hypertensive heart disease without heart failure: Secondary | ICD-10-CM | POA: Insufficient documentation

## 2022-04-08 LAB — CBC
HCT: 30.7 % — ABNORMAL LOW (ref 39.0–52.0)
Hemoglobin: 9.3 g/dL — ABNORMAL LOW (ref 13.0–17.0)
MCH: 30.5 pg (ref 26.0–34.0)
MCHC: 30.3 g/dL (ref 30.0–36.0)
MCV: 100.7 fL — ABNORMAL HIGH (ref 80.0–100.0)
Platelets: 250 10*3/uL (ref 150–400)
RBC: 3.05 MIL/uL — ABNORMAL LOW (ref 4.22–5.81)
RDW: 16.8 % — ABNORMAL HIGH (ref 11.5–15.5)
WBC: 10.4 10*3/uL (ref 4.0–10.5)
nRBC: 0 % (ref 0.0–0.2)

## 2022-04-08 LAB — BASIC METABOLIC PANEL
Anion gap: 10 (ref 5–15)
BUN: 27 mg/dL — ABNORMAL HIGH (ref 8–23)
CO2: 25 mmol/L (ref 22–32)
Calcium: 8.5 mg/dL — ABNORMAL LOW (ref 8.9–10.3)
Chloride: 102 mmol/L (ref 98–111)
Creatinine, Ser: 1.32 mg/dL — ABNORMAL HIGH (ref 0.61–1.24)
GFR, Estimated: 55 mL/min — ABNORMAL LOW (ref >60)
Glucose, Bld: 108 mg/dL — ABNORMAL HIGH (ref 70–99)
Potassium: 4.6 mmol/L (ref 3.5–5.1)
Sodium: 137 mmol/L (ref 135–145)

## 2022-04-08 LAB — SURGICAL PCR SCREEN
MRSA, PCR: NEGATIVE
Staphylococcus aureus: NEGATIVE

## 2022-04-08 NOTE — Progress Notes (Addendum)
PCP - [name] Cardiologist - Mihai Croitoru  PPM/ICD - Denies Device Orders -  Rep Notified -   Chest x-ray - N/A EKG - 03/26/22 Stress Test - 05/2016 ECHO - 03/02/2022 Cardiac Cath - 06/27/2008  Sleep Study - Yes no OSA  DM - Denies  Blood Thinner Instructions: Eliquis Aspirin Instructions:Per patient instructed to hold on Monday 19th  COVID TEST- N/A   Anesthesia review: yes sees Cardiology  Patient denies shortness of breath, fever, cough and chest pain at PAT appointment   All instructions explained to the patient, with a verbal understanding of the material. Patient agrees to go over the instructions while at home for a better understanding.he opportunity to ask questions was provided.  Updated patient instructions for arrival time of 0830

## 2022-04-08 NOTE — Addendum Note (Signed)
Addended by: Nicholas Lose on: 04/08/2022 01:52 PM   Modules accepted: Orders

## 2022-04-08 NOTE — Telephone Encounter (Signed)
Spoke with patient's daughter [name] and informed of arrival time change to 915 AM for surgery on 04/10/22. [name] verbalized understanding.

## 2022-04-09 NOTE — Anesthesia Preprocedure Evaluation (Addendum)
Anesthesia Evaluation  Patient identified by MRN, date of birth, ID band Patient awake    Reviewed: Allergy & Precautions, H&P , NPO status , Patient's Chart, lab work & pertinent test results  Airway Mallampati: II   Neck ROM: full    Dental   Pulmonary former smoker H/o lung CA s/p resection.   breath sounds clear to auscultation       Cardiovascular hypertension, + CAD   Rhythm:regular Rate:Normal     Neuro/Psych    GI/Hepatic ,GERD  ,,  Endo/Other  Hypothyroidism  Adrenal insufficiency. Takes steroids.  Renal/GU Renal diseaseH/o renal CA s/p resection.  Now has one kidney.     Musculoskeletal  (+) Arthritis ,    Abdominal   Peds  Hematology   Anesthesia Other Findings   Reproductive/Obstetrics                             Anesthesia Physical Anesthesia Plan  ASA: 3  Anesthesia Plan: MAC   Post-op Pain Management:    Induction: Intravenous  PONV Risk Score and Plan: 1 and Propofol infusion and Treatment may vary due to age or medical condition  Airway Management Planned: Simple Face Mask  Additional Equipment:   Intra-op Plan:   Post-operative Plan:   Informed Consent: I have reviewed the patients History and Physical, chart, labs and discussed the procedure including the risks, benefits and alternatives for the proposed anesthesia with the patient or authorized representative who has indicated his/her understanding and acceptance.     Dental advisory given  Plan Discussed with: CRNA, Anesthesiologist and Surgeon  Anesthesia Plan Comments: (PAT note by Karoline Caldwell, PA-C:  Follows with cardiology for history of CAD (occlusion RCA with left-to-right collaterals, occluded first diagonal, 80% stenosis ramus intermedius, 50% stenosis proximal RCA, minor irregularities left main and LAD by cardiac catheterization May 2010), normal perfusion and normal systolic function by  nuclear stress test April 2018, hypertension, hyperlipidemia, frequent PVCs, paroxysmal atrial flutter/fib.  Last seen by Dr. Sallyanne Kuster 03/26/2022.  At that time the patient reported left-sided jaw pain.  This was not felt to be anginal in nature.  Per note, "Left-sided jaw pain:I wonder if he is actually describing jaw claudication. Coupled with his markedly elevated ESR and with his history of treatment with immune checkpoint inhibitors, it is conceivable that he may have developed giant cell arteritis. He also has a mild headache. He does not have any visual problems. Jaw claudication may also be related to atherosclerotic carotid disease,but the duplex ultrasound performed about 6 months ago did not show any significant disease in the common carotid arteries or the internal carotid arteries. There is no mention of severe external carotid artery disease.Marland Kitchen He will be seeing Dr. Carlis Abbott next week. Finally, need to consider possibly slowly progressing osteonecrosis of the jaw which can be seen with his current chemotherapy agent Cabometyx."  Patient reports last dose of Eliquis 04/06/2022.  He is currently on high-dose steroids scribed by his PCP for treatment of possible temporal arteritis.  This in addition to his maintenance steroids for adrenal insufficiency.  History of lung cancer s/p right lower lobectomy.  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  He received 4 cycles of immunotherapy but this was discontinued after developing adrenal insufficiency and panhypopituitarism.  Currently maintained on Cabometyx last  seen by Dr. Alen Blew 02/17/2022 and noted to be stable at that time reasonably good performance status, no changes to management.  Adrenal insufficiency and primary hypothyroidism are  followed by endocrinologist Dr. Cruzita Lederer.  He is maintained on hydrocortisone 10 mg in the a.m. and 5 mg in the p.m.  Preop labs reviewed, creatinine mildly elevated at 1.32, moderate anemia with hemoglobin 9.3, otherwise unremarkable.  EKG 03/26/2022: NSR.  Rate 72.  TTE 03/02/2022: 1. Challenging windows. Left ventricular ejection fraction, by  estimation, is 60 to 65%. The left ventricle has normal function. The left  ventricle has no regional wall motion abnormalities. Left ventricular  diastolic parameters are consistent with  Grade I diastolic dysfunction (impaired relaxation).  2. Right ventricular systolic function is normal. The right ventricular  size is normal. Tricuspid regurgitation signal is inadequate for assessing  PA pressure.  3. No evidence of mitral valve regurgitation.  4. The aortic valve was not well visualized. Aortic valve regurgitation  is not visualized.  5. The inferior vena cava is normal in size with greater than 50%  respiratory variability, suggesting right atrial pressure of 3 mmHg.   Comparison(s): No significant change from prior study.   Carotid duplex 08/14/21: Summary:  Right Carotid: Velocities in the right ICA are consistent with a 1-39% stenosis. Non-hemodynamically significant plaque <50% noted in the CCA.  Left Carotid: Velocities in the left ICA are consistent with a 1-39% stenosis. Non-hemodynamically significant plaque <50% noted in the CCA.  Vertebrals:Bilateral vertebral arteries demonstrate antegrade flow.  Subclavians: Normal flow hemodynamics were seen in bilateral subclavian arteries.    )        Anesthesia Quick Evaluation

## 2022-04-09 NOTE — Progress Notes (Signed)
Anesthesia Chart Review:  Follows with cardiology for history of CAD (occlusion RCA with left-to-right collaterals, occluded first diagonal, 80% stenosis ramus intermedius, 50% stenosis proximal RCA, minor irregularities left main and LAD by cardiac catheterization May 2010), normal perfusion and normal systolic function by nuclear stress test April 2018, hypertension, hyperlipidemia, frequent PVCs, paroxysmal atrial flutter/fib.  Last seen by Dr. Sallyanne Kuster 03/26/2022.  At that time the patient reported left-sided jaw pain.  This was not felt to be anginal in nature.  Per note, "Left-sided jaw pain: I wonder if he is actually describing jaw claudication.  Coupled with his markedly elevated ESR and with his history of treatment with immune checkpoint inhibitors, it is conceivable that he may have developed giant cell arteritis.  He also has a mild headache.  He does not have any visual problems.  Jaw claudication may also be related to atherosclerotic carotid disease, but the duplex ultrasound performed about 6 months ago did not show any significant disease in the common carotid arteries or the internal carotid arteries.  There is no mention of severe external carotid artery disease.Marland Kitchen  He will be seeing Dr. Carlis Abbott next week.  Finally, need to consider possibly slowly progressing osteonecrosis of the jaw which can be seen with his current chemotherapy agent Cabometyx."  Patient reports last dose of Eliquis 04/06/2022.  He is currently on high-dose steroids scribed by his PCP for treatment of possible temporal arteritis.  This in addition to his maintenance steroids for adrenal insufficiency.  History of lung cancer s/p right lower lobectomy.  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  He received 4 cycles of  immunotherapy but this was discontinued after developing adrenal insufficiency and panhypopituitarism.  Currently maintained on Cabometyx last seen by Dr. Alen Blew 02/17/2022 and noted to be stable at that time reasonably good performance status, no changes to management.  Adrenal insufficiency and primary hypothyroidism are followed by endocrinologist Dr. Cruzita Lederer.  He is maintained on hydrocortisone 10 mg in the a.m. and 5 mg in the p.m.  Preop labs reviewed, creatinine mildly elevated at 1.32, moderate anemia with hemoglobin 9.3, otherwise unremarkable.  EKG 03/26/2022: NSR.  Rate 72.  TTE 03/02/2022:  1. Challenging windows. Left ventricular ejection fraction, by  estimation, is 60 to 65%. The left ventricle has normal function. The left  ventricle has no regional wall motion abnormalities. Left ventricular  diastolic parameters are consistent with  Grade I diastolic dysfunction (impaired relaxation).   2. Right ventricular systolic function is normal. The right ventricular  size is normal. Tricuspid regurgitation signal is inadequate for assessing  PA pressure.   3. No evidence of mitral valve regurgitation.   4. The aortic valve was not well visualized. Aortic valve regurgitation  is not visualized.   5. The inferior vena cava is normal in size with greater than 50%  respiratory variability, suggesting right atrial pressure of 3 mmHg.   Comparison(s): No significant change from prior study.   Carotid duplex 08/14/21: Summary:  Right Carotid: Velocities in the right ICA are consistent with a 1-39% stenosis. Non-hemodynamically significant plaque <50% noted in the CCA.  Left Carotid: Velocities in the left ICA are consistent with a 1-39% stenosis. Non-hemodynamically significant plaque <50% noted in the CCA.  Vertebrals: Bilateral vertebral arteries demonstrate antegrade flow.  Subclavians: Normal flow hemodynamics were seen in bilateral subclavian arteries.  Wynonia Musty [facility]  Short Stay Center/Anesthesiology Phone [phone] 04/09/2022 10:02 AM

## 2022-04-10 ENCOUNTER — Ambulatory Visit (HOSPITAL_COMMUNITY): Payer: Medicare HMO | Admitting: Physician Assistant

## 2022-04-10 ENCOUNTER — Encounter (HOSPITAL_COMMUNITY)
Admission: RE | Disposition: A | Discharge status: Home / Self Care | Payer: Self-pay | Source: Ambulatory Visit | Attending: Vascular Surgery

## 2022-04-10 ENCOUNTER — Ambulatory Visit (HOSPITAL_COMMUNITY)
Admission: RE | Admit: 2022-04-10 | Discharge: 2022-04-10 | Disposition: A | Discharge status: Home / Self Care | Payer: Medicare HMO | Source: Ambulatory Visit | Attending: Vascular Surgery | Admitting: Vascular Surgery

## 2022-04-10 ENCOUNTER — Encounter (HOSPITAL_COMMUNITY): Payer: Self-pay | Admitting: Vascular Surgery

## 2022-04-10 ENCOUNTER — Other Ambulatory Visit: Payer: Self-pay

## 2022-04-10 ENCOUNTER — Ambulatory Visit (HOSPITAL_BASED_OUTPATIENT_CLINIC_OR_DEPARTMENT_OTHER): Payer: Medicare HMO | Admitting: Certified Registered Nurse Anesthetist

## 2022-04-10 DIAGNOSIS — E2740 Unspecified adrenocortical insufficiency: Secondary | ICD-10-CM | POA: Diagnosis not present

## 2022-04-10 DIAGNOSIS — I10 Essential (primary) hypertension: Secondary | ICD-10-CM

## 2022-04-10 DIAGNOSIS — Z85118 Personal history of other malignant neoplasm of bronchus and lung: Secondary | ICD-10-CM | POA: Insufficient documentation

## 2022-04-10 DIAGNOSIS — N189 Chronic kidney disease, unspecified: Secondary | ICD-10-CM | POA: Insufficient documentation

## 2022-04-10 DIAGNOSIS — Z7952 Long term (current) use of systemic steroids: Secondary | ICD-10-CM | POA: Insufficient documentation

## 2022-04-10 DIAGNOSIS — Z85528 Personal history of other malignant neoplasm of kidney: Secondary | ICD-10-CM | POA: Insufficient documentation

## 2022-04-10 DIAGNOSIS — M316 Other giant cell arteritis: Secondary | ICD-10-CM | POA: Insufficient documentation

## 2022-04-10 DIAGNOSIS — I129 Hypertensive chronic kidney disease with stage 1 through stage 4 chronic kidney disease, or unspecified chronic kidney disease: Secondary | ICD-10-CM | POA: Diagnosis not present

## 2022-04-10 DIAGNOSIS — I2510 Atherosclerotic heart disease of native coronary artery without angina pectoris: Secondary | ICD-10-CM

## 2022-04-10 DIAGNOSIS — Z87891 Personal history of nicotine dependence: Secondary | ICD-10-CM | POA: Insufficient documentation

## 2022-04-10 DIAGNOSIS — Z905 Acquired absence of kidney: Secondary | ICD-10-CM | POA: Diagnosis not present

## 2022-04-10 DIAGNOSIS — K219 Gastro-esophageal reflux disease without esophagitis: Secondary | ICD-10-CM | POA: Diagnosis not present

## 2022-04-10 DIAGNOSIS — E039 Hypothyroidism, unspecified: Secondary | ICD-10-CM | POA: Diagnosis not present

## 2022-04-10 DIAGNOSIS — R6884 Jaw pain: Secondary | ICD-10-CM | POA: Diagnosis not present

## 2022-04-10 DIAGNOSIS — M1990 Unspecified osteoarthritis, unspecified site: Secondary | ICD-10-CM | POA: Diagnosis not present

## 2022-04-10 DIAGNOSIS — E785 Hyperlipidemia, unspecified: Secondary | ICD-10-CM | POA: Diagnosis not present

## 2022-04-10 DIAGNOSIS — R519 Headache, unspecified: Secondary | ICD-10-CM

## 2022-04-10 HISTORY — PX: ARTERY BIOPSY: SHX891

## 2022-04-10 SURGERY — BIOPSY TEMPORAL ARTERY
Anesthesia: Monitor Anesthesia Care | Site: Head | Laterality: Left

## 2022-04-10 MED ORDER — CHLORHEXIDINE GLUCONATE 0.12 % MT SOLN: 15.0000 mL | Freq: Once | OROMUCOSAL | Status: AC

## 2022-04-10 MED ORDER — FENTANYL CITRATE (PF) 100 MCG/2ML IJ SOLN: 25.0000 ug | INTRAMUSCULAR | Status: DC | PRN

## 2022-04-10 MED ORDER — FENTANYL CITRATE (PF) 250 MCG/5ML IJ SOLN
INTRAMUSCULAR | Status: DC | PRN
  Administered 2022-04-10: 25 ug via INTRAVENOUS

## 2022-04-10 MED ORDER — PROPOFOL 500 MG/50ML IV EMUL
INTRAVENOUS | Status: DC | PRN
  Administered 2022-04-10: 15 ug/kg/min via INTRAVENOUS

## 2022-04-10 MED ORDER — OXYCODONE HCL 5 MG/5ML PO SOLN: 5.0000 mg | Freq: Once | ORAL | Status: DC | PRN

## 2022-04-10 MED ORDER — OXYCODONE-ACETAMINOPHEN 5-325 MG PO TABS: 1.0000 | ORAL_TABLET | ORAL | 0 refills | Status: DC | PRN

## 2022-04-10 MED ORDER — LIDOCAINE HCL (PF) 1 % IJ SOLN
INTRAMUSCULAR | Status: DC | PRN
  Administered 2022-04-10: 7 mL via INTRADERMAL

## 2022-04-10 MED ORDER — ACETAMINOPHEN 10 MG/ML IV SOLN
1000.0000 mg | Freq: Four times a day (QID) | INTRAVENOUS | Status: DC
  Administered 2022-04-10: 1000 mg via INTRAVENOUS

## 2022-04-10 MED ORDER — ORAL CARE MOUTH RINSE: 15.0000 mL | Freq: Once | OROMUCOSAL | Status: AC

## 2022-04-10 MED ORDER — ONDANSETRON HCL 4 MG/2ML IJ SOLN: 4.0000 mg | Freq: Four times a day (QID) | INTRAMUSCULAR | Status: DC | PRN

## 2022-04-10 MED ORDER — ACETAMINOPHEN 10 MG/ML IV SOLN
INTRAVENOUS | Status: AC
  Filled 2022-04-10: qty 100

## 2022-04-10 MED ORDER — 0.9 % SODIUM CHLORIDE (POUR BTL) OPTIME
TOPICAL | Status: DC | PRN
  Administered 2022-04-10: 1000 mL

## 2022-04-10 MED ORDER — CHLORHEXIDINE GLUCONATE 0.12 % MT SOLN
OROMUCOSAL | Status: AC
  Administered 2022-04-10: 15 mL via OROMUCOSAL
  Filled 2022-04-10: qty 15

## 2022-04-10 MED ORDER — LIDOCAINE 2% (20 MG/ML) 5 ML SYRINGE
INTRAMUSCULAR | Status: DC | PRN
  Administered 2022-04-10: 40 mg via INTRAVENOUS

## 2022-04-10 MED ORDER — LACTATED RINGERS IV SOLN: INTRAVENOUS | Status: DC

## 2022-04-10 MED ORDER — CHLORHEXIDINE GLUCONATE 4 % EX LIQD: 60.0000 mL | Freq: Once | CUTANEOUS | Status: DC

## 2022-04-10 MED ORDER — SODIUM CHLORIDE 0.9 % IV SOLN: INTRAVENOUS | Status: DC

## 2022-04-10 MED ORDER — CEFAZOLIN SODIUM-DEXTROSE 2-4 GM/100ML-% IV SOLN
2.0000 g | INTRAVENOUS | Status: AC
  Administered 2022-04-10: 2 g via INTRAVENOUS

## 2022-04-10 MED ORDER — PROPOFOL 10 MG/ML IV BOLUS
INTRAVENOUS | Status: AC
  Filled 2022-04-10: qty 20

## 2022-04-10 MED ORDER — CEFAZOLIN SODIUM-DEXTROSE 2-4 GM/100ML-% IV SOLN
INTRAVENOUS | Status: AC
  Filled 2022-04-10: qty 100

## 2022-04-10 MED ORDER — LIDOCAINE HCL (PF) 1 % IJ SOLN
INTRAMUSCULAR | Status: AC
  Filled 2022-04-10: qty 30

## 2022-04-10 MED ORDER — FENTANYL CITRATE (PF) 250 MCG/5ML IJ SOLN
INTRAMUSCULAR | Status: AC
  Filled 2022-04-10: qty 5

## 2022-04-10 MED ORDER — OXYCODONE HCL 5 MG PO TABS: 5.0000 mg | ORAL_TABLET | Freq: Once | ORAL | Status: DC | PRN

## 2022-04-10 SURGICAL SUPPLY — 42 items
BAG COUNTER SPONGE SURGICOUNT (BAG) ×1 IMPLANT
CANISTER SUCT 3000ML PPV (MISCELLANEOUS) ×1 IMPLANT
CNTNR URN SCR LID CUP LEK RST (MISCELLANEOUS) ×1 IMPLANT
CONT SPEC 4OZ STRL OR WHT (MISCELLANEOUS) ×1
COTTONBALL LRG STERILE PKG (GAUZE/BANDAGES/DRESSINGS) ×1 IMPLANT
COVER SURGICAL LIGHT HANDLE (MISCELLANEOUS) ×1 IMPLANT
DERMABOND ADVANCED .7 DNX12 (GAUZE/BANDAGES/DRESSINGS) ×1 IMPLANT
DRAPE OPHTHALMIC 77X100 STRL (CUSTOM PROCEDURE TRAY) ×1 IMPLANT
ELECT NDL TIP 2.8 STRL (NEEDLE) ×1 IMPLANT
ELECT NEEDLE TIP 2.8 STRL (NEEDLE) ×1 IMPLANT
ELECT REM PT RETURN 9FT ADLT (ELECTROSURGICAL) ×1
ELECTRODE REM PT RTRN 9FT ADLT (ELECTROSURGICAL) ×1 IMPLANT
GAUZE SPONGE 2X2 8PLY STRL LF (GAUZE/BANDAGES/DRESSINGS) ×1 IMPLANT
GLOVE BIO SURGEON STRL SZ7.5 (GLOVE) ×1 IMPLANT
GLOVE BIOGEL PI IND STRL 7.0 (GLOVE) IMPLANT
GLOVE BIOGEL PI IND STRL 7.5 (GLOVE) IMPLANT
GLOVE BIOGEL PI IND STRL 8 (GLOVE) ×1 IMPLANT
GLOVE SURG SS PI 6.5 STRL IVOR (GLOVE) IMPLANT
GLOVE SURG SS PI 7.0 STRL IVOR (GLOVE) IMPLANT
GLOVE SURG UNDER LTX SZ8 (GLOVE) ×1 IMPLANT
GOWN STRL REUS W/ TWL LRG LVL3 (GOWN DISPOSABLE) ×3 IMPLANT
GOWN STRL REUS W/TWL LRG LVL3 (GOWN DISPOSABLE) ×3
KIT BASIN OR (CUSTOM PROCEDURE TRAY) ×1 IMPLANT
KIT TURNOVER KIT B (KITS) ×1 IMPLANT
NDL HYPO 25GX1X1/2 BEV (NEEDLE) ×1 IMPLANT
NEEDLE HYPO 25GX1X1/2 BEV (NEEDLE) ×1 IMPLANT
NS IRRIG 1000ML POUR BTL (IV SOLUTION) ×1 IMPLANT
PACK GENERAL/GYN (CUSTOM PROCEDURE TRAY) ×1 IMPLANT
PAD ARMBOARD 7.5X6 YLW CONV (MISCELLANEOUS) ×2 IMPLANT
SOL PREP POV-IOD 4OZ 10% (MISCELLANEOUS) IMPLANT
SOL SCRUB PVP POV-IOD 4OZ 7.5% (MISCELLANEOUS) ×1
SOLUTION SCRB POV-IOD 4OZ 7.5% (MISCELLANEOUS) IMPLANT
SPIKE FLUID TRANSFER (MISCELLANEOUS) ×1 IMPLANT
SUT MNCRL AB 4-0 PS2 18 (SUTURE) ×1 IMPLANT
SUT PROLENE 6 0 BV (SUTURE) IMPLANT
SUT SILK 3 0 (SUTURE) ×1
SUT SILK 3-0 18XBRD TIE 12 (SUTURE) ×1 IMPLANT
SUT VIC AB 3-0 SH 27 (SUTURE) ×1
SUT VIC AB 3-0 SH 27X BRD (SUTURE) ×1 IMPLANT
SYR CONTROL 10ML LL (SYRINGE) ×1 IMPLANT
TOWEL GREEN STERILE (TOWEL DISPOSABLE) ×1 IMPLANT
WATER STERILE IRR 1000ML POUR (IV SOLUTION) ×1 IMPLANT

## 2022-04-10 NOTE — Anesthesia Procedure Notes (Signed)
Procedure Name: MAC Date/Time: 04/10/2022 9:57 AM  Performed by: Colin Benton, CRNAPre-anesthesia Checklist: Patient identified, Emergency Drugs available, Suction available and Patient being monitored Patient Re-evaluated:Patient Re-evaluated prior to induction Oxygen Delivery Method: Simple face mask Induction Type: IV induction Placement Confirmation: positive ETCO2 Dental Injury: Teeth and Oropharynx as per pre-operative assessment

## 2022-04-10 NOTE — Interval H&P Note (Signed)
History and Physical Interval Note:  04/10/2022 9:20 AM  [name]  has presented today for surgery, with the diagnosis of Jaw pain.  The various methods of treatment have been discussed with the patient and family. After consideration of risks, benefits and other options for treatment, the patient has consented to  Procedure(s): LEFT TEMPORAL ARTERY BIOPSY (Left) as a surgical intervention.  The patient's history has been reviewed, patient examined, no change in status, stable for surgery.  I have reviewed the patient's chart and labs.  Questions were answered to the patient's satisfaction.     [name]

## 2022-04-10 NOTE — Anesthesia Postprocedure Evaluation (Signed)
Anesthesia Post Note  Patient: [name]  Procedure(s) Performed: LEFT TEMPORAL ARTERY BIOPSY (Left: Head)     Patient location during evaluation: PACU Anesthesia Type: MAC Level of consciousness: awake and alert and oriented Pain management: pain level controlled Vital Signs Assessment: post-procedure vital signs reviewed and stable Respiratory status: spontaneous breathing, nonlabored ventilation and respiratory function stable Cardiovascular status: stable and blood pressure returned to baseline Postop Assessment: no apparent nausea or vomiting Anesthetic complications: no   No notable events documented.  Last Vitals:  Vitals:   04/10/22 1115 04/10/22 1130  BP: (!) 147/77 (!) 148/70  Pulse: 70 73  Resp: 14 20  Temp:  36.7 C  SpO2: 96% 97%    Last Pain:  Vitals:   04/10/22 1102  TempSrc:   PainSc: 2                  Ancel Easler A.

## 2022-04-10 NOTE — Op Note (Signed)
    NAME: [name]    MRN: [id number] DOB: Jun 26, 1945    DATE OF OPERATION: 04/10/2022  PREOP DIAGNOSIS:    Possible temporal arteritis  POSTOP DIAGNOSIS:    Same  PROCEDURE:    Left temporal artery biopsy  SURGEON: Judeth Cornfield. Scot Dock, MD  ASSIST: None  ANESTHESIA: Local with sedation  EBL: Minimal   INDICATIONS:    [name] is a 77 y.o. male who presented for evaluation for temporal arteritis.  He had developed left jaw pain with some mild headaches associated with this and tenderness around his left eye.  Sed rate was 75.  He was started on high-dose steroids.  He presents for a biopsy.  FINDINGS:   Very small left temporal artery.  TECHNIQUE:   The patient was brought to the operating room and sedated by anesthesia.  The left temporal area was prepped and draped in the usual sterile fashion.  Prior to this, I did listen with the Doppler and was able to obtain an arterial signal just anterior to the tragus of the ear.  I did look with a duplex scanner but was unable to identify the artery as it was very small.  The adjacent veins were identified.  The skin was anesthetized with 1% lidocaine with epinephrine.  A small longitudinal incision was made over the temporal artery and the dissection carried down through the fascia.  The artery was very small.  I dissected this free proximally and distally.  To be sure that it was in fact the artery I did divided distally and there was pulsatile bleeding.  The vein was ligated proximally and distally and a segment excised and sent to pathology.  Hemostasis was obtained in the wound and the wound was then closed with interrupted 3-0 Vicryl and then the skin was closed with 4-0 Monocryl.  Dermabond was applied.  The patient tolerated the procedure well was transferred to recovery room in stable condition.  All needle and sponge counts were correct.  [name], MD, FACS [facility]  DATE  OF DICTATION:   04/10/2022

## 2022-04-10 NOTE — Transfer of Care (Signed)
Immediate Anesthesia Transfer of Care Note  Patient: [name]  Procedure(s) Performed: LEFT TEMPORAL ARTERY BIOPSY (Left: Head)  Patient Location: PACU  Anesthesia Type:MAC  Level of Consciousness: awake, alert , oriented, and patient cooperative  Airway & Oxygen Therapy: Patient Spontanous Breathing  Post-op Assessment: Report given to RN, Post -op Vital signs reviewed and stable, and Patient moving all extremities X 4  Post vital signs: Reviewed and stable  Last Vitals:  Vitals Value Taken Time  BP 143/83 04/10/22 1102  Temp 36.7 C 04/10/22 1102  Pulse 73 04/10/22 1105  Resp 14 04/10/22 1105  SpO2 95 % 04/10/22 1105  Vitals shown include unvalidated device data.  Last Pain:  Vitals:   04/10/22 0929  TempSrc:   PainSc: 0-No pain      Patients Stated Pain Goal: 0 ([id number])  Complications: No notable events documented.

## 2022-04-11 ENCOUNTER — Encounter (HOSPITAL_COMMUNITY): Payer: Self-pay | Admitting: Vascular Surgery

## 2022-04-13 LAB — SURGICAL PATHOLOGY

## 2022-04-14 ENCOUNTER — Other Ambulatory Visit: Payer: Self-pay | Admitting: Family Medicine

## 2022-04-14 DIAGNOSIS — R6884 Jaw pain: Secondary | ICD-10-CM

## 2022-04-20 ENCOUNTER — Other Ambulatory Visit: Payer: Self-pay

## 2022-04-20 ENCOUNTER — Inpatient Hospital Stay: Payer: Medicare HMO | Attending: Oncology

## 2022-04-20 ENCOUNTER — Inpatient Hospital Stay: Payer: Medicare HMO | Admitting: Internal Medicine

## 2022-04-20 VITALS — BP 131/65 | HR 97 | Temp 98.5°F | Resp 17 | Wt 174.5 lb

## 2022-04-20 DIAGNOSIS — Z85118 Personal history of other malignant neoplasm of bronchus and lung: Secondary | ICD-10-CM | POA: Insufficient documentation

## 2022-04-20 DIAGNOSIS — C787 Secondary malignant neoplasm of liver and intrahepatic bile duct: Secondary | ICD-10-CM | POA: Insufficient documentation

## 2022-04-20 DIAGNOSIS — Z79899 Other long term (current) drug therapy: Secondary | ICD-10-CM | POA: Insufficient documentation

## 2022-04-20 DIAGNOSIS — C642 Malignant neoplasm of left kidney, except renal pelvis: Secondary | ICD-10-CM

## 2022-04-20 DIAGNOSIS — Z905 Acquired absence of kidney: Secondary | ICD-10-CM | POA: Diagnosis not present

## 2022-04-20 DIAGNOSIS — M25511 Pain in right shoulder: Secondary | ICD-10-CM | POA: Diagnosis not present

## 2022-04-20 DIAGNOSIS — C786 Secondary malignant neoplasm of retroperitoneum and peritoneum: Secondary | ICD-10-CM | POA: Insufficient documentation

## 2022-04-20 DIAGNOSIS — C641 Malignant neoplasm of right kidney, except renal pelvis: Secondary | ICD-10-CM | POA: Diagnosis not present

## 2022-04-20 LAB — COMPREHENSIVE METABOLIC PANEL
ALT: 19 U/L (ref 0–44)
AST: 11 U/L — ABNORMAL LOW (ref 15–41)
Albumin: 3.2 g/dL — ABNORMAL LOW (ref 3.5–5.0)
Alkaline Phosphatase: 50 U/L (ref 38–126)
Anion gap: 6 (ref 5–15)
BUN: 24 mg/dL — ABNORMAL HIGH (ref 8–23)
CO2: 29 mmol/L (ref 22–32)
Calcium: 8.3 mg/dL — ABNORMAL LOW (ref 8.9–10.3)
Chloride: 103 mmol/L (ref 98–111)
Creatinine, Ser: 1.46 mg/dL — ABNORMAL HIGH (ref 0.61–1.24)
GFR, Estimated: 49 mL/min — ABNORMAL LOW (ref >60)
Glucose, Bld: 155 mg/dL — ABNORMAL HIGH (ref 70–99)
Potassium: 4.5 mmol/L (ref 3.5–5.1)
Sodium: 138 mmol/L (ref 135–145)
Total Bilirubin: 0.2 mg/dL — ABNORMAL LOW (ref 0.3–1.2)
Total Protein: 5.7 g/dL — ABNORMAL LOW (ref 6.5–8.1)

## 2022-04-20 LAB — CBC WITH DIFFERENTIAL/PLATELET
Abs Immature Granulocytes: 0.06 10*3/uL (ref 0.00–0.07)
Basophils Absolute: 0 10*3/uL (ref 0.0–0.1)
Basophils Relative: 1 %
Eosinophils Absolute: 0.1 10*3/uL (ref 0.0–0.5)
Eosinophils Relative: 1 %
HCT: 28.8 % — ABNORMAL LOW (ref 39.0–52.0)
Hemoglobin: 8.9 g/dL — ABNORMAL LOW (ref 13.0–17.0)
Immature Granulocytes: 1 %
Lymphocytes Relative: 8 %
Lymphs Abs: 0.7 10*3/uL (ref 0.7–4.0)
MCH: 31 pg (ref 26.0–34.0)
MCHC: 30.9 g/dL (ref 30.0–36.0)
MCV: 100.3 fL — ABNORMAL HIGH (ref 80.0–100.0)
Monocytes Absolute: 0.5 10*3/uL (ref 0.1–1.0)
Monocytes Relative: 6 %
Neutro Abs: 7.4 10*3/uL (ref 1.7–7.7)
Neutrophils Relative %: 83 %
Platelets: 256 10*3/uL (ref 150–400)
RBC: 2.87 MIL/uL — ABNORMAL LOW (ref 4.22–5.81)
RDW: 16.2 % — ABNORMAL HIGH (ref 11.5–15.5)
WBC: 8.8 10*3/uL (ref 4.0–10.5)
nRBC: 0 % (ref 0.0–0.2)

## 2022-04-20 NOTE — Progress Notes (Signed)
[place] Telephone:([phone]) [phone]   Fax:([phone]) [phone]  OFFICE PROGRESS NOTE  [name], MD [place] [id number]  DIAGNOSIS: Stage IV clear-cell renal cell carcinoma diagnosed initially in 2016 as localized T1 a disease with evidence of metastatic disease to the liver and omentum in 2019.  PRIOR THERAPY: 1) status post radical right renal nephrectomy under the care of Dr. Louis Meckel on February 01, 2015 and the final pathology revealed 3.3 cm clear renal cell carcinoma with Fuhrman grade 3. 2) status post omental biopsy in July 2019 that confirmed the presence of recurrent metastatic renal cell carcinoma. 3) status post treatment with immunotherapy with ipilimumab 1 Mg/KG and nivolumab 3 mg/KG every 3 weeks started 09/21/2017 status post 4 cycles and the patient did not receive any additional immunotherapy secondary to adrenal insufficiency and panhypopituitarism 4) status post radiotherapy to the abdominal lymph node completed November 08, 2019.  CURRENT THERAPY: Cabometyx initially started at 40 mg p.o. daily in March 2023 and reduced to 20 mg p.o. daily in April 2023.  INTERVAL HISTORY: [name] 77 y.o. male came to the clinic today accompanied by his daughter [name] to establish care with me after his primary oncologist Dr. Alen Blew left the practice.  The patient has been complaining of mouth sores as well as inability to open his mouth fully.  He was seen by his dentist and has a sinus scan that was unremarkable.  He is also scheduled for repeat scan of the jaw soon.  He has been complaining of this problem for the last 5-6 months.  He has a biopsy of the temporal artery to rule out temporal arteritis and this was unremarkable.  He is currently on treatment with prednisone.  He did hold his treatment with Cabometyx for the last 6 days.  He did not notice any difference.  He has no current nausea, vomiting, diarrhea or constipation.  He  has no headache or visual changes.  He has no fever or chills.  He has no chest pain, shortness of breath, cough or hemoptysis. He is a widow and has 2 daughters.  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  He has a history of smoking but quit 30 years ago.  He has no history of alcohol or drug abuse.  MEDICAL HISTORY: Past Medical History:  Diagnosis Date   Arthritis    Chronic kidney disease    only has one kidney    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 02/01/2015   Coronary artery disease    followed by Dr.Tilley   Frequent PVCs    GERD (gastroesophageal reflux disease)    Heart murmur    Hx of cancer of lung 1980's   Hyperlipidemia    Hypertension    Incisional hernia 08/01/2015   Lung cancer ([place]) 1993   Lung metastases 2019   Recurrent umbilical hernia [id number]    ALLERGIES:  is allergic to diphenhydramine.  MEDICATIONS:  Current Outpatient Medications  Medication Sig Dispense Refill   acetaminophen (TYLENOL) 325 MG tablet Take 2 tablets (650 mg total) by mouth every 6 (six) hours as needed for mild pain or headache (over the counter). 30 tablet 3   ALLERGY RELIEF 10 MG tablet Take 10 mg by mouth daily as needed for allergies.     benzonatate (TESSALON) 100 MG capsule Take 1 capsule (100 mg total) by mouth 3 (three) times daily as  needed for cough. (Patient not taking: Reported on 04/03/2022) 20 capsule 0   cabozantinib (CABOMETYX) 20 MG tablet Take 1 tablet (20 mg total) by mouth daily. Take on an empty stomach, 1 hour before or 2 hours after meals. 30 tablet 1   Cholecalciferol (VITAMIN D3) 50 MCG (2000 UT) TABS Take 2,000 Units by mouth daily.     diltiazem (CARDIZEM CD) 240 MG 24 hr capsule Take 1 capsule (240 mg total) by mouth in the morning. 90 capsule 3   ELIQUIS 5 MG TABS tablet Take 1 tablet (5 mg total) by mouth 2 (two) times daily. 60 tablet 2   ezetimibe (ZETIA) 10 MG tablet Take 1 tablet (10 mg total) by  mouth daily. 90 tablet 3   famotidine (PEPCID) 20 MG tablet Take 20 mg by mouth daily as needed for indigestion or heartburn.     guaiFENesin-dextromethorphan (ROBITUSSIN DM) 100-10 MG/5ML syrup Take 5 mLs by mouth every 4 (four) hours as needed for cough. (Patient not taking: Reported on 04/03/2022) 118 mL 0   hydrocortisone (CORTEF) 10 MG tablet Please take 10 mg in am and 5 mg in PM. Please allow for EMG dosing 160 tablet 3   hydrOXYzine (ATARAX) 25 MG tablet Take 25-60 mg by mouth 3 (three) times daily as needed for anxiety.     levothyroxine (SYNTHROID) 150 MCG tablet TAKE 1 TABLET BY MOUTH EVERY OTHER DAY 30 tablet 3   levothyroxine (SYNTHROID) 175 MCG tablet TAKE 1 TABLET BY MOUTH EVERY OTHER DAY 30 tablet 3   Multiple Vitamins-Minerals (CENTRUM SILVER 50+MEN) TABS Take 1 tablet by mouth daily with breakfast.     oxyCODONE-acetaminophen (PERCOCET) 5-325 MG tablet Take 1 tablet by mouth every 4 (four) hours as needed for severe pain. 12 tablet 0   predniSONE (DELTASONE) 20 MG tablet Take 20 mg by mouth every morning.     temazepam (RESTORIL) 7.5 MG capsule Take 1 capsule (7.5 mg total) by mouth at bedtime as needed for sleep. 30 capsule 0   traZODone (DESYREL) 150 MG tablet Take 150 mg by mouth at bedtime as needed for sleep.     No current facility-administered medications for this visit.    SURGICAL HISTORY:  Past Surgical History:  Procedure Laterality Date   APPENDECTOMY  age 85   ARTERY BIOPSY Left 04/10/2022   Procedure: LEFT TEMPORAL ARTERY BIOPSY;  Surgeon: Angelia Mould, MD;  Location: [facility];  Service: Vascular;  Laterality: Left;   CHOLECYSTECTOMY  10/18/1978   EYE SURGERY Bilateral 2019   cataract   LAPAROSCOPIC LYSIS OF ADHESIONS N/A 08/01/2015   Procedure: LAPAROSCOPIC LYSIS OF ADHESIONS;  Surgeon: Michael Boston, MD;  Location: [facility];  Service: General;  Laterality: N/A;   LUNG LOBECTOMY  02/16/1990   lung cancer- patient has staples in lung not to have MRI per  patient    PILONIDAL CYST EXCISION  02/16/2009   Dr Harlow Asa   ROBOTIC ASSITED PARTIAL NEPHRECTOMY Right 02/01/2015   Procedure: RIGHT ROBOTIC ASSISTED LAPAROSOCOPY NEPHRECTOMY;  Surgeon: Ardis Hughs, MD;  Location: [facility];  Service: Urology;  Laterality: Right;   VENTRAL HERNIA REPAIR N/A 08/01/2015   Procedure: LAPAROSCOPIC VENTRAL WALL HERNIA WITH MESH;  Surgeon: Michael Boston, MD;  Location: [facility];  Service: General;  Laterality: N/A;    REVIEW OF SYSTEMS:  Constitutional: positive for fatigue Eyes: negative Ears, nose, mouth, throat, and face: positive for sore mouth Respiratory: negative Cardiovascular: negative Gastrointestinal: negative Genitourinary:negative Integument/breast: negative Hematologic/lymphatic: negative Musculoskeletal:negative Neurological: negative  Behavioral/Psych: negative Endocrine: negative Allergic/Immunologic: negative   PHYSICAL EXAMINATION: General appearance: alert, cooperative, fatigued, and no distress Head: Normocephalic, without obvious abnormality, atraumatic Neck: no adenopathy, no JVD, supple, symmetrical, trachea midline, and thyroid not enlarged, symmetric, no tenderness/mass/nodules Lymph nodes: Cervical, supraclavicular, and axillary nodes normal. Resp: clear to auscultation bilaterally Back: symmetric, no curvature. ROM normal. No CVA tenderness. Cardio: regular rate and rhythm, S1, S2 normal, no murmur, click, rub or gallop GI: soft, non-tender; bowel sounds normal; no masses,  no organomegaly Extremities: extremities normal, atraumatic, no cyanosis or edema Neurologic: Alert and oriented X 3, normal strength and tone. Normal symmetric reflexes. Normal coordination and gait  ECOG PERFORMANCE STATUS: 1 - Symptomatic but completely ambulatory  Blood pressure 131/65, pulse 97, temperature 98.5 F (36.9 C), temperature source Oral, resp. rate 17, weight 174 lb 8 oz (79.2 kg), SpO2 97 %.  LABORATORY DATA: Lab Results  Component  Value Date   WBC 8.8 04/20/2022   HGB 8.9 (L) 04/20/2022   HCT 28.8 (L) 04/20/2022   MCV 100.3 (H) 04/20/2022   PLT 256 04/20/2022      Chemistry      Component Value Date/Time   NA 138 04/20/2022 1256   NA 141 08/11/2021 1252   K 4.5 04/20/2022 1256   CL 103 04/20/2022 1256   CO2 29 04/20/2022 1256   BUN 24 (H) 04/20/2022 1256   BUN 14 08/11/2021 1252   CREATININE 1.46 (H) 04/20/2022 1256   CREATININE 1.36 (H) 02/06/2022 0838      Component Value Date/Time   CALCIUM 8.3 (L) 04/20/2022 1256   ALKPHOS 50 04/20/2022 1256   AST 11 (L) 04/20/2022 1256   AST 20 02/06/2022 0838   ALT 19 04/20/2022 1256   ALT 20 02/06/2022 0838   BILITOT 0.2 (L) 04/20/2022 1256   BILITOT 0.3 02/06/2022 0838       RADIOGRAPHIC STUDIES: No results found.  ASSESSMENT AND PLAN: This is a very pleasant 77 years old white male with Stage IV clear-cell renal cell carcinoma diagnosed initially in 2016 as localized T1 a disease with evidence of metastatic disease to the liver and omentum in 2019. The patient underwent the following treatment: 1) status post radical right renal nephrectomy under the care of Dr. Louis Meckel on February 01, 2015 and the final pathology revealed 3.3 cm clear renal cell carcinoma with Fuhrman grade 3. 2) status post omental biopsy in July 2019 that confirmed the presence of recurrent metastatic renal cell carcinoma. 3) status post treatment with immunotherapy with ipilimumab 1 Mg/KG and nivolumab 3 mg/KG every 3 weeks started 09/21/2017 status post 4 cycles and the patient did not receive any additional immunotherapy secondary to adrenal insufficiency and panhypopituitarism 4) status post radiotherapy to the abdominal lymph node completed November 08, 2019. He is currently on treatment with Cabometyx 20 mg p.o. daily started March 2023.  His treatment has been on hold for the last 7 days because of concern about toxicity from the mouth sores and jaw tenderness. I recommended for  the patient to hold his Cabometyx for a few more days and if there is no improvement in his condition to resume it at the same dose. I will see him back for follow-up visit in 2 weeks for evaluation with repeat CT scan of the chest, abdomen and pelvis for restaging of his disease. The patient was advised to call immediately if he has any other concerning symptoms in the interval. The patient voices understanding of current disease status and  treatment options and is in agreement with the current care plan.  All questions were answered. The patient knows to call the clinic with any problems, questions or concerns. We can certainly see the patient much sooner if necessary. The total time spent in the appointment was 30 minutes.  Disclaimer: This note was dictated with voice recognition software. Similar sounding words can inadvertently be transcribed and may not be corrected upon review.

## 2022-04-21 ENCOUNTER — Other Ambulatory Visit (HOSPITAL_COMMUNITY): Payer: Self-pay

## 2022-04-22 ENCOUNTER — Telehealth: Payer: Self-pay | Admitting: Internal Medicine

## 2022-04-22 NOTE — Telephone Encounter (Signed)
Scheduled per 03/04 los, spoke with patient's daughter. Patient will be notified.

## 2022-04-23 ENCOUNTER — Telehealth: Payer: Self-pay | Admitting: Medical Oncology

## 2022-04-23 ENCOUNTER — Other Ambulatory Visit (HOSPITAL_COMMUNITY): Payer: Self-pay

## 2022-04-23 DIAGNOSIS — S46111A Strain of muscle, fascia and tendon of long head of biceps, right arm, initial encounter: Secondary | ICD-10-CM | POA: Diagnosis not present

## 2022-04-23 NOTE — Telephone Encounter (Signed)
Dtr reports pt feels weak. I reviewed his CBC/diff with her. I told her the hgb drifted down a little.  She will  monitor pt for now.

## 2022-04-27 ENCOUNTER — Other Ambulatory Visit (HOSPITAL_COMMUNITY): Payer: Self-pay

## 2022-04-28 ENCOUNTER — Other Ambulatory Visit (HOSPITAL_COMMUNITY): Payer: Self-pay

## 2022-04-28 ENCOUNTER — Other Ambulatory Visit: Payer: Self-pay | Admitting: Internal Medicine

## 2022-04-28 ENCOUNTER — Other Ambulatory Visit: Payer: Self-pay

## 2022-04-28 DIAGNOSIS — C641 Malignant neoplasm of right kidney, except renal pelvis: Secondary | ICD-10-CM

## 2022-04-28 MED ORDER — CABOMETYX 20 MG PO TABS
20.0000 mg | ORAL_TABLET | Freq: Every day | ORAL | 1 refills | Status: DC
  Filled 2022-04-28: qty 30, 30d supply, fill #0

## 2022-04-30 ENCOUNTER — Inpatient Hospital Stay: Payer: Medicare HMO

## 2022-05-01 ENCOUNTER — Telehealth: Payer: Self-pay | Admitting: Internal Medicine

## 2022-05-01 NOTE — Progress Notes (Signed)
[facility] OFFICE PROGRESS NOTE  [name], MD [address] [place] [address]  DIAGNOSIS: Stage IV clear-cell renal cell carcinoma diagnosed initially in 2016 as localized T1 a disease with evidence of metastatic disease to the liver and omentum in 2019.   PRIOR THERAPY: 1) status post radical right renal nephrectomy under the care of Dr. Marlou Porch on February 01, 2015 and the final pathology revealed 3.3 cm clear renal cell carcinoma with Fuhrman grade 3. 2) status post omental biopsy in July 2019 that confirmed the presence of recurrent metastatic renal cell carcinoma. 3) status post treatment with immunotherapy with ipilimumab 1 Mg/KG and nivolumab 3 mg/KG every 3 weeks started 09/21/2017 status post 4 cycles and the patient did not receive any additional immunotherapy secondary to adrenal insufficiency and panhypopituitarism 4) status post radiotherapy to the abdominal lymph node completed November 08, 2019.  CURRENT THERAPY: Cabometyx initially started at 40 mg p.o. daily in March 2023 and reduced to 20 mg p.o. daily in April 2023.   INTERVAL HISTORY: Vernice Nothdurft 77 y.o. male returns to the clinic today for a follow-up visit accompanied by his daughter.  The patient was last seen by Dr. Asa Lente 04/20/2022.  At that point in time, the patient been taking oral treatment with Cabometryx; however, he has been having some ongoing issues related to mouth sores and inability to fully open his mouth.  He is expected to have a repeat scan of the jaw soon.  He also had a biopsy of the temporal artery to rule out temporal oral arteritis which was unremarkable.  Patient saw a jaw specialist and ENT in the past.  He received injections in the jaw last week without any significant improvement.  He was previously seen by Dr. Sherran Needs from ENT.   When Dr. Arbutus Ped saw the patient last on 04/20/22, he recommended that he continue to hold his treatment.  If no significant  improvement in his symptoms, he would consider resuming his treatment.    Since last being seen, the patient resumed his Cabometyx last week.he denies any new appreciable symptoms.  Today he denies any fever, chills, or night sweats.  He denies any unexplained weight loss. He reports a fair appetite. Denies any chest pain, shortness of breath, cough, or hemoptysis.  Denies any nausea, vomiting, diarrhea, or constipation.  He denies hematuria or dysuria. The patient recently had a restaging CT scan performed of the chest, abdomen, and pelvis and jaw.  He is here today for evaluation and repeat blood work.   MEDICAL HISTORY: Past Medical History:  Diagnosis Date   Arthritis    Chronic kidney disease    only has one kidney    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 02/01/2015   Coronary artery disease    followed by Dr.Tilley   Frequent PVCs    GERD (gastroesophageal reflux disease)    Heart murmur    Hx of cancer of lung 1980's   Hyperlipidemia    Hypertension    Incisional hernia 08/01/2015   Lung cancer (HCC) 1993   Lung metastases 2019   Recurrent umbilical hernia 08/01/2015    ALLERGIES:  is allergic to diphenhydramine.  MEDICATIONS:  Current Outpatient Medications  Medication Sig Dispense Refill   acetaminophen (TYLENOL) 325 MG tablet Take 2 tablets (650 mg total) by mouth every 6 (six) hours as needed for mild pain or headache (over the counter). 30 tablet 3   ALLERGY RELIEF 10 MG  tablet Take 10 mg by mouth daily as needed for allergies.     benzonatate (TESSALON) 100 MG capsule Take 1 capsule (100 mg total) by mouth 3 (three) times daily as needed for cough. (Patient not taking: Reported on 04/03/2022) 20 capsule 0   cabozantinib (CABOMETYX) 20 MG tablet Take 1 tablet (20 mg total) by mouth daily. Take on an empty stomach, 1 hour before or 2 hours after meals. 30 tablet 1   Cholecalciferol (VITAMIN D3) 50 MCG (2000 UT) TABS Take 2,000 Units by mouth  daily.     diltiazem (CARDIZEM CD) 240 MG 24 hr capsule Take 1 capsule (240 mg total) by mouth in the morning. 90 capsule 3   ELIQUIS 5 MG TABS tablet Take 1 tablet (5 mg total) by mouth 2 (two) times daily. 60 tablet 2   ezetimibe (ZETIA) 10 MG tablet Take 1 tablet (10 mg total) by mouth daily. 90 tablet 3   famotidine (PEPCID) 20 MG tablet Take 20 mg by mouth daily as needed for indigestion or heartburn.     fluticasone (FLONASE) 50 MCG/ACT nasal spray Place 2 sprays into both nostrils daily.     guaiFENesin-dextromethorphan (ROBITUSSIN DM) 100-10 MG/5ML syrup Take 5 mLs by mouth every 4 (four) hours as needed for cough. (Patient not taking: Reported on 04/03/2022) 118 mL 0   hydrocortisone (CORTEF) 10 MG tablet Please take 10 mg in am and 5 mg in PM. Please allow for EMG dosing 160 tablet 3   hydrOXYzine (ATARAX) 25 MG tablet Take 25-60 mg by mouth 3 (three) times daily as needed for anxiety.     levothyroxine (SYNTHROID) 150 MCG tablet TAKE 1 TABLET BY MOUTH EVERY OTHER DAY 30 tablet 3   levothyroxine (SYNTHROID) 175 MCG tablet TAKE 1 TABLET BY MOUTH EVERY OTHER DAY 30 tablet 3   Multiple Vitamins-Minerals (CENTRUM SILVER 50+MEN) TABS Take 1 tablet by mouth daily with breakfast.     oxyCODONE-acetaminophen (PERCOCET) 5-325 MG tablet Take 1 tablet by mouth every 4 (four) hours as needed for severe pain. 12 tablet 0   predniSONE (DELTASONE) 20 MG tablet Take 20 mg by mouth every morning.     temazepam (RESTORIL) 7.5 MG capsule Take 1 capsule (7.5 mg total) by mouth at bedtime as needed for sleep. 30 capsule 0   traZODone (DESYREL) 150 MG tablet Take 150 mg by mouth at bedtime as needed for sleep.     No current facility-administered medications for this visit.    SURGICAL HISTORY:  Past Surgical History:  Procedure Laterality Date   APPENDECTOMY  age 85   ARTERY BIOPSY Left 04/10/2022   Procedure: LEFT TEMPORAL ARTERY BIOPSY;  Surgeon: Chuck Hint, MD;  Location: [facility] OR;  Service:  Vascular;  Laterality: Left;   CHOLECYSTECTOMY  10/18/1978   EYE SURGERY Bilateral 2019   cataract   LAPAROSCOPIC LYSIS OF ADHESIONS N/A 08/01/2015   Procedure: LAPAROSCOPIC LYSIS OF ADHESIONS;  Surgeon: Karie Soda, MD;  Location: [facility] ORS;  Service: General;  Laterality: N/A;   LUNG LOBECTOMY  02/16/1990   lung cancer- patient has staples in lung not to have MRI per patient    PILONIDAL CYST EXCISION  02/16/2009   Dr Gerrit Friends   ROBOTIC ASSITED PARTIAL NEPHRECTOMY Right 02/01/2015   Procedure: RIGHT ROBOTIC ASSISTED LAPAROSOCOPY NEPHRECTOMY;  Surgeon: Crist Fat, MD;  Location: [facility] ORS;  Service: Urology;  Laterality: Right;   VENTRAL HERNIA REPAIR N/A 08/01/2015   Procedure: LAPAROSCOPIC VENTRAL WALL HERNIA WITH MESH;  Surgeon:  Karie Soda, MD;  Location: [facility] ORS;  Service: General;  Laterality: N/A;    REVIEW OF SYSTEMS:   Review of Systems  Constitutional: Negative for appetite change, chills, fatigue, fever and unexpected weight change.  HENT: Positive for jaw pain/difficulty opening his jaw (left).  Negative for mouth sores, nosebleeds, sore throat and trouble swallowing.   Eyes: Negative for eye problems and icterus.  Respiratory: Negative for cough, hemoptysis, shortness of breath and wheezing.   Cardiovascular: Negative for chest pain and leg swelling.  Gastrointestinal: Negative for abdominal pain, constipation, diarrhea, nausea and vomiting.  Genitourinary: Negative for bladder incontinence, difficulty urinating, dysuria, frequency and hematuria.   Musculoskeletal: Negative for back pain, gait problem, neck pain and neck stiffness.  Skin: Negative for itching and rash.  Neurological: Negative for dizziness, extremity weakness, gait problem, headaches, light-headedness and seizures.  Hematological: Negative for adenopathy. Does not bruise/bleed easily.  Psychiatric/Behavioral: Negative for confusion, depression and sleep disturbance. The patient is not nervous/anxious.      PHYSICAL EXAMINATION:  There were no vitals taken for this visit.  ECOG PERFORMANCE STATUS: 1  Physical Exam  Constitutional: Oriented to person, place, and time and well-developed, well-nourished, and in no distress.  HENT:  Head: Normocephalic and atraumatic.  Mouth/Throat: Difficulty opening jaw.  Eyes: Conjunctivae are normal. Right eye exhibits no discharge. Left eye exhibits no discharge. No scleral icterus.  Neck: Normal range of motion. Neck supple.  Cardiovascular: Normal rate, regular rhythm, normal heart sounds and intact distal pulses.   Pulmonary/Chest: Effort normal and breath sounds normal. No respiratory distress. No wheezes. No rales.  Abdominal: Soft. Bowel sounds are normal. Exhibits no distension and no mass. There is no tenderness.  Musculoskeletal: Normal range of motion. Exhibits no edema.  Lymphadenopathy:    No cervical adenopathy.  Neurological: Alert and oriented to person, place, and time. Exhibits normal muscle tone. Gait normal. Coordination normal.  Skin: Skin is warm and dry. No rash noted. Not diaphoretic. No erythema. No pallor.  Psychiatric: Mood, memory and judgment normal.  Vitals reviewed.  LABORATORY DATA: Lab Results  Component Value Date   WBC 8.8 04/20/2022   HGB 8.9 (L) 04/20/2022   HCT 28.8 (L) 04/20/2022   MCV 100.3 (H) 04/20/2022   PLT 256 04/20/2022      Chemistry      Component Value Date/Time   NA 138 04/20/2022 1256   NA 141 08/11/2021 1252   K 4.5 04/20/2022 1256   CL 103 04/20/2022 1256   CO2 29 04/20/2022 1256   BUN 24 (H) 04/20/2022 1256   BUN 14 08/11/2021 1252   CREATININE 1.46 (H) 04/20/2022 1256   CREATININE 1.36 (H) 02/06/2022 0838      Component Value Date/Time   CALCIUM 8.3 (L) 04/20/2022 1256   ALKPHOS 50 04/20/2022 1256   AST 11 (L) 04/20/2022 1256   AST 20 02/06/2022 0838   ALT 19 04/20/2022 1256   ALT 20 02/06/2022 0838   BILITOT 0.2 (L) 04/20/2022 1256   BILITOT 0.3 02/06/2022 0838        RADIOGRAPHIC STUDIES:  No results found.   ASSESSMENT/PLAN:  This is a very pleasant 77 year old Caucasian male diagnosed with stage IV clear-cell renal cell carcinoma.  The patient was diagnosed initially in 2016 and has localized T1a disease with evidence of metastatic disease to the liver and omentum in 2019.  The patient underwent the following treatments: 1) status post radical right renal nephrectomy under the care of Dr. Marlou Porch on February 01, 2015 and the final pathology revealed 3.3 cm clear renal cell carcinoma with Fuhrman grade 3. 2) status post omental biopsy in July 2019 that confirmed the presence of recurrent metastatic renal cell carcinoma. 3) status post treatment with immunotherapy with ipilimumab 1 Mg/KG and nivolumab 3 mg/KG every 3 weeks started 09/21/2017 status post 4 cycles and the patient did not receive any additional immunotherapy secondary to adrenal insufficiency and panhypopituitarism 4) status post radiotherapy to the abdominal lymph node completed November 08, 2019.   He is currently on treatment with Cabometyx 20 mg p.o. daily started March 2023. His treatment has been on hold for the last 14 days or so because of concern about toxicity from the mouth sores and jaw tenderness.   The patient recently had a restaging CT scan performed.  The patient was seen with Dr. Arbutus Ped today.  The CT radiology over read of the CT abdomen, chest, and pelvis has not been read yet but Dr. Arbutus Ped personally and independently reviewed the scan does not feel that there is evidence of disease progression.  However, the CT of the maxillofacial area unfortunately showed struct of process involving the left posterior lateral wall of the left maxillary sinus with extension into the left masticator space which could be secondary to metastatic disease versus fungal infection could be considered.  This has progressed since his last scan in December which showed early bone changes.    Dr. Arbutus Ped recommends that the patient continue to hold his Cabometyx for now and undergo a biopsy.  We will reach out to his ENT provider to see if this will be amenable to a biopsy.  If this is found to be metastatic, Dr. Arbutus Ped would recommend referral to radiation oncology.  If found to be infectious in origin, such as a fungal infection, Dr. Arbutus Ped recommend referral to infectious disease.  We will see the patient back for follow-up visit in 2 weeks for evaluation repeat blood work.  Should there be any evidence of disease progression on the pending CT chest, abdomen, pelvis, we will call the patient to let him know.  The patient was advised to call immediately if he has any concerning symptoms in the interval. The patient voices understanding of current disease status and treatment options and is in agreement with the current care plan. All questions were answered. The patient knows to call the clinic with any problems, questions or concerns. We can certainly see the patient much sooner if necessary   No orders of the defined types were placed in this encounter.    Delainey Winstanley L Krisy Dix, PA-C 05/01/22  ADDENDUM: Hematology/Oncology Attending: I had a face to face encounter with the patient today.  I reviewed his record, lab and recommended his care plan.  This is a very pleasant 78 years old white male with a stage IV renal cell carcinoma status post several treatment and currently on Cabometyx 40 mg initially which was reduced to 20 mg p.o. daily in April 2023.  The patient has been complaining of ongoing issue with inability to fully open his mouth.  He has evaluation by ENT and jaw specialist in the past and received injection in the jaw last week with no improvement. He had CT scan of the maxillofacial sinuses performed recently and unfortunately showed destructive process involving the posterior lateral wall of the left maxillary sinus with extension into the left masticator  space concerning for metastatic disease versus fungal infection.  CT scan of the chest, abdomen and pelvis showed  no evidence for disease progression. I recommended for the patient to hold his treatment with Cabometyx for now.  Will refer him to ENT for consideration of biopsy and evaluation of the destructive lesion to rule out metastatic disease versus fungal infection. We will see the patient back for follow-up visit in 2 weeks for evaluation and more discussion of his treatment based on the biopsy results. The patient was advised to call immediately if he has any concerning symptoms in the interval. The total time spent in the appointment was 30 minutes. Disclaimer: This note was dictated with voice recognition software. Similar sounding words can inadvertently be transcribed and may be missed upon review. Lajuana Matte, MD

## 2022-05-01 NOTE — Telephone Encounter (Signed)
Called patient regarding upcoming March appointments, patient is notified. 

## 2022-05-03 ENCOUNTER — Ambulatory Visit (HOSPITAL_BASED_OUTPATIENT_CLINIC_OR_DEPARTMENT_OTHER): Admission: RE | Admit: 2022-05-03 | Payer: Medicare HMO | Source: Ambulatory Visit

## 2022-05-03 ENCOUNTER — Ambulatory Visit (HOSPITAL_BASED_OUTPATIENT_CLINIC_OR_DEPARTMENT_OTHER)
Admission: RE | Admit: 2022-05-03 | Discharge: 2022-05-03 | Disposition: A | Discharge status: Home / Self Care | Payer: Medicare HMO | Source: Ambulatory Visit | Attending: Internal Medicine | Admitting: Internal Medicine

## 2022-05-03 DIAGNOSIS — R590 Localized enlarged lymph nodes: Secondary | ICD-10-CM | POA: Diagnosis not present

## 2022-05-03 DIAGNOSIS — K760 Fatty (change of) liver, not elsewhere classified: Secondary | ICD-10-CM | POA: Diagnosis not present

## 2022-05-03 DIAGNOSIS — R6884 Jaw pain: Secondary | ICD-10-CM | POA: Diagnosis not present

## 2022-05-03 DIAGNOSIS — K5730 Diverticulosis of large intestine without perforation or abscess without bleeding: Secondary | ICD-10-CM | POA: Diagnosis not present

## 2022-05-03 DIAGNOSIS — C641 Malignant neoplasm of right kidney, except renal pelvis: Secondary | ICD-10-CM | POA: Diagnosis not present

## 2022-05-03 DIAGNOSIS — C649 Malignant neoplasm of unspecified kidney, except renal pelvis: Secondary | ICD-10-CM | POA: Diagnosis not present

## 2022-05-04 ENCOUNTER — Other Ambulatory Visit: Payer: Medicare HMO

## 2022-05-05 ENCOUNTER — Other Ambulatory Visit: Payer: Self-pay

## 2022-05-05 ENCOUNTER — Ambulatory Visit: Payer: Medicare HMO | Admitting: Physician Assistant

## 2022-05-05 ENCOUNTER — Inpatient Hospital Stay: Payer: Medicare HMO | Admitting: Physician Assistant

## 2022-05-05 VITALS — BP 155/77 | HR 67 | Temp 98.3°F | Resp 14 | Wt 174.6 lb

## 2022-05-05 DIAGNOSIS — R6884 Jaw pain: Secondary | ICD-10-CM

## 2022-05-05 DIAGNOSIS — C649 Malignant neoplasm of unspecified kidney, except renal pelvis: Secondary | ICD-10-CM | POA: Diagnosis not present

## 2022-05-05 DIAGNOSIS — Z85118 Personal history of other malignant neoplasm of bronchus and lung: Secondary | ICD-10-CM | POA: Diagnosis not present

## 2022-05-05 DIAGNOSIS — Z79899 Other long term (current) drug therapy: Secondary | ICD-10-CM | POA: Diagnosis not present

## 2022-05-05 DIAGNOSIS — Z905 Acquired absence of kidney: Secondary | ICD-10-CM | POA: Diagnosis not present

## 2022-05-05 DIAGNOSIS — C786 Secondary malignant neoplasm of retroperitoneum and peritoneum: Secondary | ICD-10-CM | POA: Diagnosis not present

## 2022-05-05 DIAGNOSIS — C787 Secondary malignant neoplasm of liver and intrahepatic bile duct: Secondary | ICD-10-CM | POA: Diagnosis not present

## 2022-05-05 DIAGNOSIS — C641 Malignant neoplasm of right kidney, except renal pelvis: Secondary | ICD-10-CM | POA: Diagnosis not present

## 2022-05-06 ENCOUNTER — Other Ambulatory Visit: Payer: Self-pay | Admitting: Physician Assistant

## 2022-05-06 ENCOUNTER — Telehealth: Payer: Self-pay | Admitting: Medical Oncology

## 2022-05-06 DIAGNOSIS — G893 Neoplasm related pain (acute) (chronic): Secondary | ICD-10-CM

## 2022-05-06 MED ORDER — OXYCODONE-ACETAMINOPHEN 5-325 MG PO TABS: 1.0000 | ORAL_TABLET | Freq: Four times a day (QID) | ORAL | 0 refills | Status: DC | PRN

## 2022-05-06 NOTE — Telephone Encounter (Signed)
Significant L jaw pain-Tylenol and advil are not controlling the pain.  Uses [facility]. Dtr notified that Cassie sending in hydrocodone .

## 2022-05-07 ENCOUNTER — Telehealth: Payer: Self-pay | Admitting: Medical Oncology

## 2022-05-07 NOTE — Telephone Encounter (Signed)
Dtr notified that oxycodone was called in . She said pt said it did not help. However , she said he only took 1 tablet yesterday.

## 2022-05-12 ENCOUNTER — Ambulatory Visit (HOSPITAL_COMMUNITY): Payer: Medicare HMO

## 2022-05-13 DIAGNOSIS — J3489 Other specified disorders of nose and nasal sinuses: Secondary | ICD-10-CM | POA: Diagnosis not present

## 2022-05-13 DIAGNOSIS — R6884 Jaw pain: Secondary | ICD-10-CM | POA: Diagnosis not present

## 2022-05-13 DIAGNOSIS — R252 Cramp and spasm: Secondary | ICD-10-CM | POA: Diagnosis not present

## 2022-05-13 DIAGNOSIS — J320 Chronic maxillary sinusitis: Secondary | ICD-10-CM | POA: Diagnosis not present

## 2022-05-14 ENCOUNTER — Telehealth: Payer: Self-pay | Admitting: Medical Oncology

## 2022-05-14 ENCOUNTER — Other Ambulatory Visit (HOSPITAL_COMMUNITY): Payer: Self-pay

## 2022-05-14 NOTE — Telephone Encounter (Signed)
Coady had his appt with Dr.Meghan Skotnicki , DO ENT @ [facility]. -she is ordering a scan with contrast and will consider bx after the scan.   Should  he keep appts on 04/02 with [facility].? Per Julien Nordmann , I told [name] that appts are cancelled  . [name] will call back after his appt / possible biopsy with Dr. Fredric Dine.  Cabozantinib 20 mg tablet on hold.

## 2022-05-15 ENCOUNTER — Other Ambulatory Visit: Payer: Self-pay | Admitting: Otolaryngology

## 2022-05-18 ENCOUNTER — Other Ambulatory Visit: Payer: Self-pay | Admitting: Physician Assistant

## 2022-05-18 ENCOUNTER — Telehealth: Payer: Self-pay | Admitting: Medical Oncology

## 2022-05-18 ENCOUNTER — Other Ambulatory Visit: Payer: Self-pay | Admitting: Medical Oncology

## 2022-05-18 DIAGNOSIS — C649 Malignant neoplasm of unspecified kidney, except renal pelvis: Secondary | ICD-10-CM

## 2022-05-18 DIAGNOSIS — G893 Neoplasm related pain (acute) (chronic): Secondary | ICD-10-CM

## 2022-05-18 DIAGNOSIS — R6884 Jaw pain: Secondary | ICD-10-CM

## 2022-05-18 MED ORDER — OXYCODONE-ACETAMINOPHEN 5-325 MG PO TABS: 1.0000 | ORAL_TABLET | Freq: Four times a day (QID) | ORAL | 0 refills | Status: DC | PRN

## 2022-05-18 MED ORDER — MORPHINE SULFATE ER 15 MG PO TBCR: 15.0000 mg | EXTENDED_RELEASE_TABLET | Freq: Two times a day (BID) | ORAL | 0 refills | Status: DC

## 2022-05-18 NOTE — Telephone Encounter (Signed)
Pain not controlled with oxycodone 5-325 q 6 . His pain breaks through at 3 hours.   I told dtr that Cassie is changing his pain  med to long acting and refilling oxycodone.  Fayez has a biopsy on Friday .

## 2022-05-19 ENCOUNTER — Encounter (HOSPITAL_COMMUNITY): Payer: Self-pay

## 2022-05-19 ENCOUNTER — Encounter (HOSPITAL_COMMUNITY): Payer: Self-pay | Admitting: Otolaryngology

## 2022-05-19 ENCOUNTER — Ambulatory Visit (HOSPITAL_COMMUNITY)
Admission: RE | Admit: 2022-05-19 | Discharge: 2022-05-19 | Disposition: A | Discharge status: Home / Self Care | Payer: Medicare HMO | Source: Ambulatory Visit | Attending: Otolaryngology | Admitting: Otolaryngology

## 2022-05-19 ENCOUNTER — Ambulatory Visit: Payer: Medicare HMO | Admitting: Internal Medicine

## 2022-05-19 ENCOUNTER — Other Ambulatory Visit: Payer: Medicare HMO

## 2022-05-19 ENCOUNTER — Other Ambulatory Visit (HOSPITAL_COMMUNITY): Payer: Self-pay | Admitting: Otolaryngology

## 2022-05-19 DIAGNOSIS — J3489 Other specified disorders of nose and nasal sinuses: Secondary | ICD-10-CM | POA: Diagnosis not present

## 2022-05-19 MED ORDER — IOHEXOL 300 MG/ML  SOLN
70.0000 mL | Freq: Once | INTRAMUSCULAR | Status: AC | PRN
  Administered 2022-05-19: 70 mL via INTRAVENOUS

## 2022-05-19 MED ORDER — SODIUM CHLORIDE (PF) 0.9 % IJ SOLN
INTRAMUSCULAR | Status: AC
  Filled 2022-05-19: qty 50

## 2022-05-19 NOTE — Progress Notes (Signed)
PCP - Dr Claris Gower Cardiologist - Dr Sanda Klein Internal Med - Dr Philemon Kingdom  CT Chest x-ray - 05/03/22 EKG - 03/26/22 Stress Test - 05/2016 ECHO - 03/02/22 Cardiac Cath - 06/26/10  ICD Pacemaker/Loop - n/a  Sleep Study -  Yes, yrs ago - neg CPAP - none  Diabetes - n/a  Blood Thinner Instructions:  Hold for 3 days.  Last dose of Eliquis was on 05/17/22.  Aspirin Instructions: n/a  ERAS: Clear liquids til 4:30 AM DOS.  Anesthesia review: Yes  STOP now taking any Aspirin (unless otherwise instructed by your surgeon), Aleve, Naproxen, Ibuprofen, Motrin, Advil, Goody's, BC's, all herbal medications, fish oil, and all vitamins.   Coronavirus Screening Do the patient have any of the following symptoms:  Cough yes/no: No Fever (>100.61F)  yes/no: No Runny nose yes/no: No Sore throat yes/no: No Difficulty breathing/shortness of breath  yes/no: No  Has the patient traveled in the last 14 days and where? yes/no: No  Daughter Loran Senters verbalized understanding of instructions that were given via phone.  [name] states patient can not read or write.

## 2022-05-20 ENCOUNTER — Other Ambulatory Visit (HOSPITAL_COMMUNITY): Payer: Self-pay

## 2022-05-20 NOTE — Progress Notes (Signed)
Anesthesia Chart Review:  Follows with cardiology for history of CAD (occlusion RCA with left-to-right collaterals, occluded first diagonal, 80% stenosis ramus intermedius, 50% stenosis proximal RCA, minor irregularities left main and LAD by cardiac catheterization May 2010), normal perfusion and normal systolic function by nuclear stress test April 2018 (done at Dr. Thurman Coyer office), hypertension, hyperlipidemia, frequent PVCs, paroxysmal atrial flutter/fib.  Last seen by Dr. Sallyanne Kuster 03/26/2022.  At that time the patient reported left-sided jaw pain.  This was not felt to be anginal in nature.  Per note, "Left-sided jaw pain: I wonder if he is actually describing jaw claudication.  Coupled with his markedly elevated ESR and with his history of treatment with immune checkpoint inhibitors, it is conceivable that he may have developed giant cell arteritis.  He also has a mild headache.  He does not have any visual problems.  Jaw claudication may also be related to atherosclerotic carotid disease, but the duplex ultrasound performed about 6 months ago did not show any significant disease in the common carotid arteries or the internal carotid arteries.  There is no mention of severe external carotid artery disease.Marland Kitchen  He will be seeing Dr. Carlis Abbott next week.  Finally, need to consider possibly slowly progressing osteonecrosis of the jaw which can be seen with his current chemotherapy agent Cabometyx."  Patient did subsequently undergo temporal artery biopsy on 04/10/2022 which was unremarkable.  He had subsequent follow-up with Dr. Fredric Dine and there was concern for possible metastatic disease of the left maxillary sinus.  Per note 05/13/2022, "Nasal endoscopy performed today with findings as above, findings are consistent with probable metastatic disease with superimposed infection. Recommended patient begin daily use of nasal saline rinses. Prescription for Augmentin and mupirocin ointment also sent to his pharmacy.  Counseled patient that definitive diagnosis will need to be made with biopsy under general anesthesia in the operating room."   History of lung cancer s/p right lower lobectomy.   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  He received 4 cycles of immunotherapy but this was discontinued after developing adrenal insufficiency and panhypopituitarism.  Currently maintained on Cabometyx last seen by Dr. Alen Blew 02/17/2022 and noted to be stable at that time reasonably good performance status, no changes to management.   Adrenal insufficiency and primary hypothyroidism are followed by endocrinologist Dr. Cruzita Lederer.  He is maintained on hydrocortisone 10 mg in the a.m. and 5 mg in the p.m.  Patient reports last dose of Eliquis 05/17/2022.  Patient will need day of surgery labs and evaluation.   EKG 03/26/2022: NSR.  Rate 72.   TTE 03/02/2022:  1. Challenging windows. Left ventricular ejection fraction, by  estimation, is 60 to 65%. The left ventricle has normal function. The left  ventricle has no regional wall motion abnormalities. Left ventricular  diastolic parameters are consistent with  Grade I diastolic dysfunction (impaired relaxation).   2. Right ventricular systolic function is normal. The right ventricular  size is normal. Tricuspid regurgitation signal is inadequate for assessing  PA pressure.   3. No evidence of mitral valve regurgitation.   4. The aortic valve was not well visualized. Aortic valve regurgitation  is not visualized.   5. The inferior vena cava is normal in size with greater than 50%  respiratory variability, suggesting right atrial pressure of 3 mmHg.   Comparison(s): No significant change from prior study.  Carotid duplex 08/14/21: Summary:  Right Carotid: Velocities  in the right ICA are consistent with a 1-39% stenosis. Non-hemodynamically significant plaque <50% noted in the CCA.  Left Carotid: Velocities in the left ICA are consistent with a 1-39% stenosis. Non-hemodynamically significant plaque <50% noted in the CCA.  Vertebrals: Bilateral vertebral arteries demonstrate antegrade flow.  Subclavians: Normal flow hemodynamics were seen in bilateral subclavian arteries.     Wynonia Musty [facility] Short Stay Center/Anesthesiology Phone [phone] 05/21/2022 1:45 PM

## 2022-05-21 NOTE — Anesthesia Preprocedure Evaluation (Addendum)
Anesthesia Evaluation  Patient identified by MRN, date of birth, ID band Patient awake    Reviewed: Allergy & Precautions, H&P , NPO status , Patient's Chart, lab work & pertinent test results  Airway Mallampati: III   Neck ROM: full  Mouth opening: Limited Mouth Opening  Dental  (+) Poor Dentition, Chipped, Missing, Dental Advisory Given   Pulmonary pneumonia, former smoker H/o lung CA s/p resection.   breath sounds clear to auscultation       Cardiovascular hypertension, Pt. on medications + CAD  + Valvular Problems/Murmurs  Rhythm:regular Rate:Normal     Neuro/Psych    GI/Hepatic ,GERD  ,,  Endo/Other  Hypothyroidism  Adrenal insufficiency. Takes steroids.  Renal/GU Renal diseaseH/o renal CA s/p resection.  Now has one kidney.     Musculoskeletal  (+) Arthritis ,    Abdominal   Peds  Hematology   Anesthesia Other Findings   Reproductive/Obstetrics                             Anesthesia Physical Anesthesia Plan  ASA: 3  Anesthesia Plan: General   Post-op Pain Management: Minimal or no pain anticipated, Tylenol PO (pre-op)* and Celebrex PO (pre-op)*   Induction: Intravenous  PONV Risk Score and Plan: 1 and Treatment may vary due to age or medical condition, Ondansetron and Dexamethasone  Airway Management Planned: LMA, Oral ETT, Video Laryngoscope Planned and Fiberoptic Intubation Planned  Additional Equipment: None  Intra-op Plan:   Post-operative Plan:   Informed Consent: I have reviewed the patients History and Physical, chart, labs and discussed the procedure including the risks, benefits and alternatives for the proposed anesthesia with the patient or authorized representative who has indicated his/her understanding and acceptance.     Dental advisory given  Plan Discussed with: CRNA and Anesthesiologist  Anesthesia Plan Comments: (PAT note by Antionette Poles,  PA-C: Follows with cardiology for history of CAD (occlusion RCA with left-to-right collaterals, occluded first diagonal, 80% stenosis ramus intermedius, 50% stenosis proximal RCA, minor irregularities left main and LAD by cardiac catheterization May 2010), normal perfusion and normal systolic function by nuclear stress test April 2018 (done at Dr. York Spaniel office), hypertension, hyperlipidemia, frequent PVCs, paroxysmal atrial flutter/fib.  Last seen by Dr. Royann Shivers 03/26/2022.  At that time the patient reported left-sided jaw pain.  This was not felt to be anginal in nature.  Per note, "Left-sided jaw pain:I wonder if he is actually describing jaw claudication. Coupled with his markedly elevated ESR and with his history of treatment with immune checkpoint inhibitors, it is conceivable that he may have developed giant cell arteritis. He also has a mild headache. He does not have any visual problems. Jaw claudication may also be related to atherosclerotic carotid disease,but the duplex ultrasound performed about 6 months ago did not show any significant disease in the common carotid arteries or the internal carotid arteries. There is no mention of severe external carotid artery disease.Marland Kitchen He will be seeing Dr. Chestine Spore next week. Finally, need to consider possibly slowly progressing osteonecrosis of the jaw which can be seen with his current chemotherapy agent Cabometyx."  Patient did subsequently undergo temporal artery biopsy on 04/10/2022 which was unremarkable.  He had subsequent follow-up with Dr. Marene Lenz and there was concern for possible metastatic disease of the left maxillary sinus.  Per note 05/13/2022, "Nasal endoscopy performed today with findings as above, findings are consistent with probable metastatic disease with superimposed infection. Recommended patient begin  daily use of nasal saline rinses. Prescription for Augmentin and mupirocin ointment also sent to his pharmacy. Counseled patient that  definitive diagnosis will need to be made with biopsy under general anesthesia in the operating room."  History of lung cancer s/p right lower lobectomy.  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  He received 4 cycles of immunotherapy but this was discontinued after developing adrenal insufficiency and panhypopituitarism.  Currently maintained on Cabometyx last seen by Dr. Clelia Croft 02/17/2022 and noted to be stable at that time reasonably good performance status, no changes to management.  Adrenal insufficiency and primary hypothyroidism are followed by endocrinologist Dr. Elvera Lennox.  He is maintained on hydrocortisone 10 mg in the a.m. and 5 mg in the p.m.  Patient reports last dose of Eliquis 05/17/2022.  Patient will need day of surgery labs and evaluation.  EKG 03/26/2022: NSR.  Rate 72.  TTE 03/02/2022: 1. Challenging windows. Left ventricular ejection fraction, by  estimation, is 60 to 65%. The left ventricle has normal function. The left  ventricle has no regional wall motion abnormalities. Left ventricular  diastolic parameters are consistent with  Grade I diastolic dysfunction (impaired relaxation).  2. Right ventricular systolic function is normal. The right ventricular  size is normal. Tricuspid regurgitation signal is inadequate for assessing  PA pressure.  3. No evidence of mitral valve regurgitation.  4. The aortic valve was not well visualized. Aortic valve regurgitation  is not visualized.  5. The inferior vena cava is normal in size with greater than 50%  respiratory variability, suggesting right atrial pressure of 3 mmHg.   Comparison(s): No significant change from prior study.   Carotid duplex 08/14/21: Summary:  Right Carotid: Velocities in the right ICA are  consistent with a 1-39% stenosis. Non-hemodynamically significant plaque <50% noted in the CCA.  Left Carotid: Velocities in the left ICA are consistent with a 1-39% stenosis. Non-hemodynamically significant plaque <50% noted in the CCA.  Vertebrals:Bilateral vertebral arteries demonstrate antegrade flow.  Subclavians: Normal flow hemodynamics were seen in bilateral subclavian arteries.     )        Anesthesia Quick Evaluation

## 2022-05-22 ENCOUNTER — Other Ambulatory Visit: Payer: Self-pay

## 2022-05-22 ENCOUNTER — Ambulatory Visit (HOSPITAL_BASED_OUTPATIENT_CLINIC_OR_DEPARTMENT_OTHER): Payer: Medicare HMO | Admitting: Physician Assistant

## 2022-05-22 ENCOUNTER — Other Ambulatory Visit (HOSPITAL_COMMUNITY): Payer: Self-pay

## 2022-05-22 ENCOUNTER — Encounter (HOSPITAL_COMMUNITY)
Admission: RE | Disposition: A | Discharge status: Home / Self Care | Payer: Self-pay | Source: Home / Self Care | Attending: Otolaryngology

## 2022-05-22 ENCOUNTER — Ambulatory Visit (HOSPITAL_COMMUNITY)
Admission: RE | Admit: 2022-05-22 | Discharge: 2022-05-22 | Disposition: A | Discharge status: Home / Self Care | Payer: Medicare HMO | Attending: Otolaryngology | Admitting: Otolaryngology

## 2022-05-22 ENCOUNTER — Ambulatory Visit (HOSPITAL_COMMUNITY): Payer: Medicare HMO | Admitting: Physician Assistant

## 2022-05-22 ENCOUNTER — Encounter (HOSPITAL_COMMUNITY): Payer: Self-pay | Admitting: Otolaryngology

## 2022-05-22 DIAGNOSIS — J320 Chronic maxillary sinusitis: Secondary | ICD-10-CM | POA: Insufficient documentation

## 2022-05-22 DIAGNOSIS — M1990 Unspecified osteoarthritis, unspecified site: Secondary | ICD-10-CM | POA: Insufficient documentation

## 2022-05-22 DIAGNOSIS — R6884 Jaw pain: Secondary | ICD-10-CM | POA: Insufficient documentation

## 2022-05-22 DIAGNOSIS — Z85118 Personal history of other malignant neoplasm of bronchus and lung: Secondary | ICD-10-CM | POA: Diagnosis not present

## 2022-05-22 DIAGNOSIS — Z905 Acquired absence of kidney: Secondary | ICD-10-CM | POA: Insufficient documentation

## 2022-05-22 DIAGNOSIS — E039 Hypothyroidism, unspecified: Secondary | ICD-10-CM | POA: Insufficient documentation

## 2022-05-22 DIAGNOSIS — R252 Cramp and spasm: Secondary | ICD-10-CM | POA: Diagnosis not present

## 2022-05-22 DIAGNOSIS — J3489 Other specified disorders of nose and nasal sinuses: Secondary | ICD-10-CM

## 2022-05-22 DIAGNOSIS — Z902 Acquired absence of lung [part of]: Secondary | ICD-10-CM | POA: Diagnosis not present

## 2022-05-22 DIAGNOSIS — I2510 Atherosclerotic heart disease of native coronary artery without angina pectoris: Secondary | ICD-10-CM | POA: Diagnosis not present

## 2022-05-22 DIAGNOSIS — Z87891 Personal history of nicotine dependence: Secondary | ICD-10-CM | POA: Insufficient documentation

## 2022-05-22 DIAGNOSIS — N189 Chronic kidney disease, unspecified: Secondary | ICD-10-CM | POA: Insufficient documentation

## 2022-05-22 DIAGNOSIS — Z79899 Other long term (current) drug therapy: Secondary | ICD-10-CM | POA: Diagnosis not present

## 2022-05-22 DIAGNOSIS — Z7901 Long term (current) use of anticoagulants: Secondary | ICD-10-CM | POA: Diagnosis not present

## 2022-05-22 DIAGNOSIS — I10 Essential (primary) hypertension: Secondary | ICD-10-CM | POA: Insufficient documentation

## 2022-05-22 DIAGNOSIS — Z9221 Personal history of antineoplastic chemotherapy: Secondary | ICD-10-CM | POA: Diagnosis not present

## 2022-05-22 DIAGNOSIS — Z79890 Hormone replacement therapy (postmenopausal): Secondary | ICD-10-CM | POA: Insufficient documentation

## 2022-05-22 DIAGNOSIS — Z85528 Personal history of other malignant neoplasm of kidney: Secondary | ICD-10-CM | POA: Diagnosis not present

## 2022-05-22 DIAGNOSIS — K219 Gastro-esophageal reflux disease without esophagitis: Secondary | ICD-10-CM | POA: Insufficient documentation

## 2022-05-22 DIAGNOSIS — I129 Hypertensive chronic kidney disease with stage 1 through stage 4 chronic kidney disease, or unspecified chronic kidney disease: Secondary | ICD-10-CM | POA: Insufficient documentation

## 2022-05-22 DIAGNOSIS — E785 Hyperlipidemia, unspecified: Secondary | ICD-10-CM | POA: Diagnosis not present

## 2022-05-22 DIAGNOSIS — J189 Pneumonia, unspecified organism: Secondary | ICD-10-CM | POA: Diagnosis not present

## 2022-05-22 DIAGNOSIS — E2740 Unspecified adrenocortical insufficiency: Secondary | ICD-10-CM | POA: Insufficient documentation

## 2022-05-22 DIAGNOSIS — Z923 Personal history of irradiation: Secondary | ICD-10-CM | POA: Diagnosis not present

## 2022-05-22 HISTORY — PX: SINUS ENDO WITH FUSION: SHX5329

## 2022-05-22 HISTORY — PX: MAXILLARY ANTROSTOMY: SHX2003

## 2022-05-22 HISTORY — DX: Hypothyroidism, unspecified: E03.9

## 2022-05-22 HISTORY — DX: Pneumonia, unspecified organism: J18.9

## 2022-05-22 LAB — CBC
HCT: 29.4 % — ABNORMAL LOW (ref 39.0–52.0)
Hemoglobin: 8.9 g/dL — ABNORMAL LOW (ref 13.0–17.0)
MCH: 29 pg (ref 26.0–34.0)
MCHC: 30.3 g/dL (ref 30.0–36.0)
MCV: 95.8 fL (ref 80.0–100.0)
Platelets: 265 10*3/uL (ref 150–400)
RBC: 3.07 MIL/uL — ABNORMAL LOW (ref 4.22–5.81)
RDW: 16 % — ABNORMAL HIGH (ref 11.5–15.5)
WBC: 6.9 10*3/uL (ref 4.0–10.5)
nRBC: 0 % (ref 0.0–0.2)

## 2022-05-22 LAB — COMPREHENSIVE METABOLIC PANEL
ALT: 15 U/L (ref 0–44)
AST: 12 U/L — ABNORMAL LOW (ref 15–41)
Albumin: 2.8 g/dL — ABNORMAL LOW (ref 3.5–5.0)
Alkaline Phosphatase: 61 U/L (ref 38–126)
Anion gap: 10 (ref 5–15)
BUN: 16 mg/dL (ref 8–23)
CO2: 26 mmol/L (ref 22–32)
Calcium: 8.6 mg/dL — ABNORMAL LOW (ref 8.9–10.3)
Chloride: 99 mmol/L (ref 98–111)
Creatinine, Ser: 1.35 mg/dL — ABNORMAL HIGH (ref 0.61–1.24)
GFR, Estimated: 53 mL/min — ABNORMAL LOW (ref >60)
Glucose, Bld: 93 mg/dL (ref 70–99)
Potassium: 4.4 mmol/L (ref 3.5–5.1)
Sodium: 135 mmol/L (ref 135–145)
Total Bilirubin: 0.6 mg/dL (ref 0.3–1.2)
Total Protein: 6.3 g/dL — ABNORMAL LOW (ref 6.5–8.1)

## 2022-05-22 LAB — AEROBIC/ANAEROBIC CULTURE W GRAM STAIN (SURGICAL/DEEP WOUND)

## 2022-05-22 SURGERY — SURGERY, PARANASAL SINUS, ENDOSCOPIC, WITH NASAL SEPTOPLASTY, TURBINOPLASTY, AND MAXILLARY SINUSOTOMY
Anesthesia: General | Site: Nose | Laterality: Left

## 2022-05-22 MED ORDER — MIDAZOLAM HCL 2 MG/2ML IJ SOLN
INTRAMUSCULAR | Status: DC | PRN
  Administered 2022-05-22: 1 mg via INTRAVENOUS

## 2022-05-22 MED ORDER — LIDOCAINE-EPINEPHRINE 1 %-1:100000 IJ SOLN
INTRAMUSCULAR | Status: AC
  Filled 2022-05-22: qty 1

## 2022-05-22 MED ORDER — PHENYLEPHRINE 80 MCG/ML (10ML) SYRINGE FOR IV PUSH (FOR BLOOD PRESSURE SUPPORT)
PREFILLED_SYRINGE | INTRAVENOUS | Status: DC | PRN
  Administered 2022-05-22 (×4): 80 ug via INTRAVENOUS

## 2022-05-22 MED ORDER — 0.9 % SODIUM CHLORIDE (POUR BTL) OPTIME
TOPICAL | Status: DC | PRN
  Administered 2022-05-22: 1000 mL

## 2022-05-22 MED ORDER — LIDOCAINE 2% (20 MG/ML) 5 ML SYRINGE
INTRAMUSCULAR | Status: AC
  Filled 2022-05-22: qty 5

## 2022-05-22 MED ORDER — ACETAMINOPHEN 325 MG PO TABS: 325.0000 mg | ORAL_TABLET | ORAL | Status: DC | PRN

## 2022-05-22 MED ORDER — CHLORHEXIDINE GLUCONATE 0.12 % MT SOLN
OROMUCOSAL | Status: AC
  Administered 2022-05-22: 15 mL
  Filled 2022-05-22: qty 15

## 2022-05-22 MED ORDER — OXYCODONE HCL 5 MG/5ML PO SOLN: 5.0000 mg | Freq: Once | ORAL | Status: DC | PRN

## 2022-05-22 MED ORDER — PROPOFOL 10 MG/ML IV BOLUS
INTRAVENOUS | Status: AC
  Filled 2022-05-22: qty 20

## 2022-05-22 MED ORDER — PHENYLEPHRINE HCL-NACL 20-0.9 MG/250ML-% IV SOLN
INTRAVENOUS | Status: DC | PRN
  Administered 2022-05-22: 25 ug/min via INTRAVENOUS

## 2022-05-22 MED ORDER — FENTANYL CITRATE (PF) 100 MCG/2ML IJ SOLN: 25.0000 ug | INTRAMUSCULAR | Status: DC | PRN

## 2022-05-22 MED ORDER — MIDAZOLAM HCL 2 MG/2ML IJ SOLN
INTRAMUSCULAR | Status: AC
  Filled 2022-05-22: qty 2

## 2022-05-22 MED ORDER — DEXAMETHASONE SODIUM PHOSPHATE 10 MG/ML IJ SOLN
INTRAMUSCULAR | Status: AC
  Filled 2022-05-22: qty 1

## 2022-05-22 MED ORDER — OXYCODONE HCL 5 MG PO TABS: 5.0000 mg | ORAL_TABLET | Freq: Once | ORAL | Status: DC | PRN

## 2022-05-22 MED ORDER — PROPOFOL 10 MG/ML IV BOLUS
INTRAVENOUS | Status: DC | PRN
  Administered 2022-05-22: 50 mg via INTRAVENOUS
  Administered 2022-05-22: 100 mg via INTRAVENOUS

## 2022-05-22 MED ORDER — ACETAMINOPHEN 500 MG PO TABS
1000.0000 mg | ORAL_TABLET | Freq: Once | ORAL | Status: DC
  Filled 2022-05-22: qty 2

## 2022-05-22 MED ORDER — GLYCOPYRROLATE PF 0.2 MG/ML IJ SOSY
PREFILLED_SYRINGE | INTRAMUSCULAR | Status: AC
  Filled 2022-05-22: qty 2

## 2022-05-22 MED ORDER — EPINEPHRINE HCL (NASAL) 0.1 % NA SOLN
NASAL | Status: DC | PRN
  Administered 2022-05-22: 20 mL via NASAL

## 2022-05-22 MED ORDER — CEFAZOLIN SODIUM-DEXTROSE 2-4 GM/100ML-% IV SOLN
2.0000 g | INTRAVENOUS | Status: AC
  Administered 2022-05-22: 2 g via INTRAVENOUS
  Filled 2022-05-22: qty 100

## 2022-05-22 MED ORDER — ONDANSETRON HCL 4 MG/2ML IJ SOLN
INTRAMUSCULAR | Status: DC | PRN
  Administered 2022-05-22: 4 mg via INTRAVENOUS

## 2022-05-22 MED ORDER — ACETAMINOPHEN 160 MG/5ML PO SOLN: 325.0000 mg | ORAL | Status: DC | PRN

## 2022-05-22 MED ORDER — HEMOSTATIC AGENTS (NO CHARGE) OPTIME
TOPICAL | Status: DC | PRN
  Administered 2022-05-22: 2 via TOPICAL

## 2022-05-22 MED ORDER — LACTATED RINGERS IV SOLN: INTRAVENOUS | Status: DC | PRN

## 2022-05-22 MED ORDER — FENTANYL CITRATE (PF) 250 MCG/5ML IJ SOLN
INTRAMUSCULAR | Status: AC
  Filled 2022-05-22: qty 5

## 2022-05-22 MED ORDER — SODIUM CHLORIDE 0.9 % IR SOLN
Status: DC | PRN
  Administered 2022-05-22: 1000 mL

## 2022-05-22 MED ORDER — CELECOXIB 200 MG PO CAPS
200.0000 mg | ORAL_CAPSULE | Freq: Once | ORAL | Status: AC
  Administered 2022-05-22: 200 mg via ORAL
  Filled 2022-05-22: qty 1

## 2022-05-22 MED ORDER — IBUPROFEN 800 MG PO TABS
800.0000 mg | ORAL_TABLET | Freq: Three times a day (TID) | ORAL | 0 refills | Status: AC | PRN
  Filled 2022-05-22: qty 12, 4d supply, fill #0

## 2022-05-22 MED ORDER — LIDOCAINE-EPINEPHRINE 1 %-1:100000 IJ SOLN
INTRAMUSCULAR | Status: DC | PRN
  Administered 2022-05-22: 4.5 mL

## 2022-05-22 MED ORDER — ONDANSETRON HCL 4 MG/2ML IJ SOLN
INTRAMUSCULAR | Status: AC
  Filled 2022-05-22: qty 2

## 2022-05-22 MED ORDER — MEPERIDINE HCL 25 MG/ML IJ SOLN: 6.2500 mg | INTRAMUSCULAR | Status: DC | PRN

## 2022-05-22 MED ORDER — EPINEPHRINE HCL (NASAL) 0.1 % NA SOLN
NASAL | Status: AC
  Filled 2022-05-22: qty 90

## 2022-05-22 MED ORDER — FENTANYL CITRATE (PF) 250 MCG/5ML IJ SOLN
INTRAMUSCULAR | Status: DC | PRN
  Administered 2022-05-22: 50 ug via INTRAVENOUS

## 2022-05-22 MED ORDER — DEXAMETHASONE SODIUM PHOSPHATE 10 MG/ML IJ SOLN
INTRAMUSCULAR | Status: DC | PRN
  Administered 2022-05-22: 10 mg via INTRAVENOUS

## 2022-05-22 MED ORDER — ONDANSETRON HCL 4 MG/2ML IJ SOLN: 4.0000 mg | Freq: Once | INTRAMUSCULAR | Status: DC | PRN

## 2022-05-22 MED ORDER — SUCCINYLCHOLINE CHLORIDE 200 MG/10ML IV SOSY
PREFILLED_SYRINGE | INTRAVENOUS | Status: AC
  Filled 2022-05-22: qty 10

## 2022-05-22 SURGICAL SUPPLY — 50 items
ATTRACTOMAT 16X20 MAGNETIC DRP (DRAPES) IMPLANT
BAG COUNTER SPONGE SURGICOUNT (BAG) ×2 IMPLANT
BAG SPNG CNTER NS LX DISP (BAG) ×2
BALLN FRONTAL NUVENT 6X17 (BALLOONS)
BALLOON FRONTAL NUVENT 6X17 (BALLOONS) IMPLANT
BLADE INF TURB ROT M4 2 5PK (BLADE) IMPLANT
BLADE ROTATE RAD 40 4 M4 (BLADE) IMPLANT
BLADE ROTATE TRICUT 4X13 M4 (BLADE) ×2 IMPLANT
BLADE SURG 15 STRL LF DISP TIS (BLADE) IMPLANT
BLADE SURG 15 STRL SS (BLADE)
CANISTER SUCT 3000ML PPV (MISCELLANEOUS) ×4 IMPLANT
COAGULATOR SUCT 8FR VV (MISCELLANEOUS) IMPLANT
COAGULATOR SUCT SWTCH 10FR 6 (ELECTROSURGICAL) IMPLANT
DRAPE HALF SHEET 40X57 (DRAPES) IMPLANT
DRSG NASOPORE 8CM (GAUZE/BANDAGES/DRESSINGS) IMPLANT
ELECT COATED BLADE 2.86 ST (ELECTRODE) IMPLANT
ELECT REM PT RETURN 9FT ADLT (ELECTROSURGICAL) ×2
ELECTRODE REM PT RTRN 9FT ADLT (ELECTROSURGICAL) ×2 IMPLANT
FILTER ARTHROSCOPY CONVERTOR (FILTER) ×2 IMPLANT
GLOVE BIO SURGEON STRL SZ 6.5 (GLOVE) ×2 IMPLANT
HEMOSTAT ARISTA ABSORB 3G PWDR (HEMOSTASIS) IMPLANT
INFLATOR BALLN (BALLOONS)
INFLATOR BALLOON W/TUBE (BALLOONS) IMPLANT
KIT BASIN OR (CUSTOM PROCEDURE TRAY) ×2 IMPLANT
KIT TURNOVER KIT B (KITS) ×2 IMPLANT
NDL HYPO 25GX1X1/2 BEV (NEEDLE) ×4 IMPLANT
NDL SPNL 25GX3.5 QUINCKE BL (NEEDLE) ×2 IMPLANT
NEEDLE HYPO 25GX1X1/2 BEV (NEEDLE) ×4 IMPLANT
NEEDLE SPNL 25GX3.5 QUINCKE BL (NEEDLE) ×2 IMPLANT
NS IRRIG 1000ML POUR BTL (IV SOLUTION) ×2 IMPLANT
PAD ARMBOARD 7.5X6 YLW CONV (MISCELLANEOUS) ×4 IMPLANT
PATTIES SURGICAL .5 X3 (DISPOSABLE) ×2 IMPLANT
PENCIL SMOKE EVACUATOR (MISCELLANEOUS) IMPLANT
SOL ANTI FOG 6CC (MISCELLANEOUS) ×2 IMPLANT
SPLINT NASAL DOYLE BI-VL (GAUZE/BANDAGES/DRESSINGS) IMPLANT
SPLINT NASAL POSISEP X .6X2 (GAUZE/BANDAGES/DRESSINGS) ×2 IMPLANT
SUT CHROMIC 4 0 P 3 18 (SUTURE) IMPLANT
SUT PLAIN 4 0 ~~LOC~~ 1 (SUTURE) IMPLANT
SUT SILK 2 0 SH (SUTURE) ×2 IMPLANT
SWAB COLLECTION DEVICE MRSA (MISCELLANEOUS) IMPLANT
SWAB CULTURE ESWAB REG 1ML (MISCELLANEOUS) IMPLANT
SYR CONTROL 10ML LL (SYRINGE) IMPLANT
SYR TB 1ML LUER SLIP (SYRINGE) ×4 IMPLANT
TOWEL GREEN STERILE FF (TOWEL DISPOSABLE) ×2 IMPLANT
TRACKER ENT INSTRUMENT (MISCELLANEOUS) ×4 IMPLANT
TRACKER ENT PATIENT (MISCELLANEOUS) ×2 IMPLANT
TRAY ENT MC OR (CUSTOM PROCEDURE TRAY) ×2 IMPLANT
TUBE CONNECTING 12X1/4 (SUCTIONS) ×2 IMPLANT
TUBING EXTENTION W/L.L. (IV SETS) IMPLANT
TUBING STRAIGHTSHOT EPS 5PK (TUBING) ×2 IMPLANT

## 2022-05-22 NOTE — Transfer of Care (Signed)
Immediate Anesthesia Transfer of Care Note  Patient: [name]  Procedure(s) Performed: SINUS ENDO WITH FUSION (Left) MAXILLARY ANTROSTOMY (Left: Nose)  Patient Location: PACU  Anesthesia Type:General  Level of Consciousness: awake, alert , oriented, and patient cooperative  Airway & Oxygen Therapy: Patient Spontanous Breathing and Patient connected to face mask oxygen  Post-op Assessment: Report given to RN, Post -op Vital signs reviewed and stable, and Patient moving all extremities X 4  Post vital signs: Reviewed and stable  Last Vitals:  Vitals Value Taken Time  BP    Temp    Pulse    Resp    SpO2      Last Pain:  Vitals:   05/22/22 0618  TempSrc:   PainSc: 1          Complications:  Encounter Notable Events  Notable Event Outcome Phase Comment  Difficult to intubate - expected  Intraprocedure Filed from anesthesia note documentation.

## 2022-05-22 NOTE — Op Note (Signed)
OPERATIVE NOTE  [name] Date/Time of Admission: 05/22/2022  5:31 AM  CSN: [id number];MRN:[id number] Attending Provider: Cheron Schaumann A, DO Room/Bed: MCPO/NONE DOB: 03-Feb-1946 Age: 77 y.o.   Pre-Op Diagnosis: MAXILLARY SINUS MASS CHRONIC MAXILLARY SINUSITIS  TRISMUS JAW PAIN  Post-Op Diagnosis: MAXILLARY SINUS MASS CHRONIC MAXILLARY SINUSITIS  TRISMUS JAW PAIN  Procedure: Procedure(s): LEFT FUNCTIONAL ENDOSCOPIC SINUS SURGERY WITH MAXILLARY ANTROSTOMY WITH TISSUE REMOVAL AND IMAGE GUIDANCE  Anesthesia: General  Surgeon(s): Hayslee Casebolt A Sherrice Creekmore, DO  Staff: Circulator: Ivin Booty, RN Scrub Person: Carmela Rima Circulator Assistant: Hermelinda Dellen, RN  Implants: * No implants in log *  Specimens: ID Type Source Tests Collected by Time Destination  1 : mastecator space mass Tissue PATH Other SURGICAL PATHOLOGY Adriene Padula A, DO 05/22/2022 5597   2 : left sinus content Tissue PATH Other SURGICAL PATHOLOGY Rosaleah Person A, DO 05/22/2022 4163   A : left maxillary sinus Body Fluid PATH Other AEROBIC/ANAEROBIC CULTURE W GRAM STAIN (SURGICAL/DEEP WOUND) Hoy Fallert A, DO 05/22/2022 0840   B : left maxillary sinus tissue Tissue PATH Other AEROBIC/ANAEROBIC CULTURE W GRAM STAIN (SURGICAL/DEEP WOUND) Meric Joye A, DO 05/22/2022 0841     Complications: None  EBL: 15 ML  Condition: stable  Operative Findings:  Copious purulent drainage filling left maxillary sinus with polypoid mucosal edema, Fibrinous/purulent tissue along maxillary sinus mucosa. Unable to view obvious mass despite use of 45 and 70 degree scopes. Multiple biopsies taken from soft tissue deep to site of bony erosion.  Description of Operation: Once operative consent was obtained and the site and surgery were confirmed with the patient and the operating room team, the patient was brought back to the operating room and general endotracheal anesthesia was obtained. The patient  was turned over to the ENT service, at which time the image-guided system was attached and noted to be in good calibration. Lidocaine 1% with 1:100,000 epinephrine was injected into the nasal septum, the left middle turbinate, and the axilla between the medial turbinate and the lateral nasal wall. Afrin-soaked pledgets were placed into the nasal cavity, and the patient was prepped and draped in sterile fashion. Attention turned to the left-sided sinonasal cavity. The middle turbinate was medialized and a ball-tipped seeker was used to slightly anterior fracture the uncinate process. Copious purulence was immediately encountered and cultures were obtained. Attention was then turned to the uncinate process, which was completely fractured anteriorly and then removed with a combination of backbiter and a microdebrider. A ball-tipped seeker was used to identify the natural os of the maxillary sinus, and it was widened with combination of the backbiter, the microdebrider, the olive tip suction, and the straight TruCut until it was widely patent. A 45 and 70 degree scope were used to visualize the entirety of the maxillary sinus. Following copious irrigation, the mucosa along the posterolateral wall corresponding the the site of bony defect was biopsied and sent as a permanent specimen as well as tissue for culture. The sinus was again copiously irrigated and hemostasis was assured. Arista was placed in the nasal cavity and Posisep was placed lateral to the middle turbinate in the axilla between it and the lateral nasal wall. An orogastric tube was placed and the stomach cavity was suctioned to reduce postoperative nausea. The patient was turned over to anesthesia service and was extubated in the operating room and transferred to the PACU in stable condition. The patient will be discharged today and followed up in the ENT clinic in 1 week  for postoperative check.   Laren Boom, DO [facility]  05/22/2022

## 2022-05-22 NOTE — Discharge Instructions (Signed)
[facility] SINUS SURGERY (FESS) Post Operative Instructions  Office: [phone]  The Surgery Itself Endoscopic sinus surgery (with or without septoplasty and turbinate reduction) involves general anesthesia, typically for one to two hours. Patients may be sedated for several hours after surgery and may remain sleepy for the better part of the day. Nausea and vomiting are occasionally seen, and usually resolve by the evening of surgery - even without additional medications. Almost all patients can go home the day of surgery.  After Surgery  Facial pressure and fullness similar to a sinus infection/headache is normal after surgery. Breathing through your nose is also difficult due to swelling. A humidifier or vaporizer can be used in the bedroom to prevent throat pain with mouth breathing.   Bloody nasal drainage is normal after this surgery for 5-7 days, usually decreasing in volume with each day that passes. Drainage will flow from the front of the nose and down the back of the throat. Make sure you spit out blood drainage that drips down the back of your throat to prevent nausea/vomiting. You will have a nasal drip pad/sling with gauze to catch drainage from the front of your nose. The dressing may need to be changed frequently during the first 24 hours following surgery. In case of profuse nasal bleeding, you may apply ice to the bridge of the nose and pinch the nose just above the tip and hold for 10 minutes; if bleeding continues, contact the doctors office.   Frequent hot showers or saline nasal rinses (NeilMed) will help break up congestion and clear any clot or mucus that builds up within the nose after surgery. This can be started the day after surgery.   It is more comfortable to sleep with extra pillows or in a recliner for the first few days after surgery until the drainage begins to resolve.    Do not blow your nose for 2 weeks after surgery.   Avoid lifting > 10 lbs. and no  vigorous exercise for 2 weeks after surgery.   Avoid airplane travel for 2 weeks following sinus surgery; the cabin pressure changes can cause pain and swelling within the nose/sinuses.   Sense of smell and taste are often diminished for several weeks after surgery. There may be some tenderness or numbness in your upper front teeth, which is normal after surgery. You may express old clot, discolored mucus or very large nasal crusts from your nose for up to 3-4 weeks after surgery; depending on how frequently and how effectively you irrigate your nose with the saltwater spray.   You may have absorbable sutures inside of your nose after surgery that will slowly dissolve in 2-3 weeks. Be careful when clearing crusts from the nose since they may be attached to these sutures.  Medications  Pain medication can be used for pain as prescribed. Pain and pressure in the nose is expected after surgery. As the surgical site heals, pain will resolve over the course of a week. Pain medications can cause nausea, which can be prevented if you take them with food or milk.   You may be given an antibiotic for one week after surgery to prevent infection. Take this medication with food to prevent nausea or vomiting.   You can use 2 nasal sprays after surgery: Afrin can be used up to 2 times a day for up to 5 days after surgery (best before bed) to reduce bloody drainage from the nose for the first few days after surgery. Saline/salt   water spray should be used at least 4-6 times per day, starting the day after surgery to prevent crusting inside of the nose.   Take all of your routine medications as prescribed, unless told otherwise by your surgeon. Any medications that thin the blood should be avoided. This includes aspirin. Avoid aspirin-like products for the first 72 hours after surgery (Advil, Motrin, Excedrin, Alieve, Celebrex, Naprosyn), but you may use them as needed for pain after 72 hours.   

## 2022-05-22 NOTE — Anesthesia Procedure Notes (Signed)
Procedure Name: Intubation Date/Time: 05/22/2022 7:48 AM  Performed by: Shary Decamp, CRNAPre-anesthesia Checklist: Patient identified, Patient being monitored, Timeout performed, Emergency Drugs available and Suction available Patient Re-evaluated:Patient Re-evaluated prior to induction Oxygen Delivery Method: Circle System Utilized Preoxygenation: Pre-oxygenation with 100% oxygen Induction Type: IV induction Ventilation: Mask ventilation without difficulty Laryngoscope Size: Mac, 3 and Glidescope Grade View: Grade I Tube type: Oral Tube size: 7.5 mm Number of attempts: 1 Airway Equipment and Method: Stylet, Video-laryngoscopy and Rigid stylet Placement Confirmation: ETT inserted through vocal cords under direct vision, positive ETCO2 and breath sounds checked- equal and bilateral Secured at: 22 cm Tube secured with: Tape Dental Injury: Teeth and Oropharynx as per pre-operative assessment  Difficulty Due To: Difficulty was anticipated and Difficult Airway- due to limited oral opening Future Recommendations: Recommend- induction with short-acting agent, and alternative techniques readily available Comments: Patient stated he is unable to open his mouth for six months now secondary to left side tumor. Slow induction without paralytics, easy to mask and ett was placed with no problems.

## 2022-05-22 NOTE — H&P (Signed)
[name] is an 77 y.o. male.    Chief Complaint:  Left jaw pain  HPI: Patient presents today for planned elective procedure.  He denies any interval change in history since office visit on 05/13/2022:   [name] is a 77 y.o. male who presents as a return consult, referred by Gerald StabsMohamed, Mohamed Kamel,*, for evaluation and treatment of left maxillary sinus mass. Patient was last seen in our office on 01/20/2022 for recurrent sinusitis and facial pain. At that time, nasal endoscopy was unremarkable, and dedicated CT sinus was recommended for further evaluation due to normal nasal endoscopy. At that time, imaging reported widespread mucosal thickening throughout the paranasal sinuses, worse in the left maxillary sinus consistent with rhinosinusitis and nasal septal deviation. He was subsequently treated with oral antibiotics and steroids, and noted mild improvement in his symptoms.   Since that time, patient has been closely followed by cardiology, vascular surgery and oncology. Patient has history of stage IV renal cell carcinoma; disease is widely metastatic with involvement of the lung, liver, omentum, retrocaval lymph nodes s/p radiation therapy targeted to abdominal lymphadenopathy. He was treated with Opdivo-year Voora for 4 cycles but developed adrenal insufficiency and panhypopituitarism. He is currently on Cabometyx since March 2023. He had a biopsy of the temporal artery to rule out temporal arteritis which was unremarkable. He has also been following a TMJ specialist and has received injections without any significant improvement. He continues to endorse ongoing symptoms of trismus and intraoral soreness. He does endorse some numbness over the left mid face.   Past Medical History:  Diagnosis Date   Arthritis    Chronic kidney disease    only has one kidney    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 02/01/2015   Coronary artery disease    followed by Dr.Tilley    Frequent PVCs    GERD (gastroesophageal reflux disease)    Heart murmur    never has caused any problems   Hx of cancer of lung 1980's   Hyperlipidemia    Hypertension    Hypothyroidism    Incisional hernia 08/01/2015   Lung cancer 1993   Lung metastases 2019   Pneumonia    x several   Recurrent umbilical hernia 08/01/2015    Past Surgical History:  Procedure Laterality Date   APPENDECTOMY  age 77   ARTERY BIOPSY Left 04/10/2022   Procedure: LEFT TEMPORAL ARTERY BIOPSY;  Surgeon: Chuck Hintickson, Christopher S, MD;  [facility];  Service: Vascular;  Laterality: Left;   CHOLECYSTECTOMY  10/18/1978   EYE SURGERY Bilateral 2019   cataract   LAPAROSCOPIC LYSIS OF ADHESIONS N/A 08/01/2015   Procedure: LAPAROSCOPIC LYSIS OF ADHESIONS;  Surgeon: Karie SodaSteven Gross, MD;  Location: [facility] ORS;  Service: General;  Laterality: N/A;   LUNG LOBECTOMY  02/16/1990   lung cancer- patient has staples in lung not to have MRI per patient    PILONIDAL CYST EXCISION  02/16/2009   Dr Gerrit FriendsGerkin   ROBOTIC ASSITED PARTIAL NEPHRECTOMY Right 02/01/2015   Procedure: RIGHT ROBOTIC ASSISTED LAPAROSOCOPY NEPHRECTOMY;  Surgeon: Crist FatBenjamin W Herrick, MD;  Location: [facility] ORS;  Service: Urology;  Laterality: Right;   VENTRAL HERNIA REPAIR N/A 08/01/2015   Procedure: LAPAROSCOPIC VENTRAL WALL HERNIA WITH MESH;  Surgeon: Karie SodaSteven Gross, MD;  Location: [facility] ORS;  Service: General;  Laterality: N/A;    Family History  Problem Relation Age of Onset   Heart attack Father     Social History:  reports that he  quit smoking about 34 years ago. His smoking use included cigarettes. He has a 20.00 pack-year smoking history. He has never used smokeless tobacco. He reports that he does not currently use alcohol. He reports that he does not use drugs.  Allergies:  Allergies  Allergen Reactions   Diphenhydramine Other (See Comments)    Severe restless legs    Medications Prior to Admission  Medication Sig Dispense Refill   acetaminophen  (TYLENOL) 500 MG tablet Take 1,000 mg by mouth every 6 (six) hours as needed for moderate pain.     diltiazem (CARDIZEM CD) 240 MG 24 hr capsule Take 1 capsule (240 mg total) by mouth in the morning. 90 capsule 3   ELIQUIS 5 MG TABS tablet Take 1 tablet (5 mg total) by mouth 2 (two) times daily. 60 tablet 2   ezetimibe (ZETIA) 10 MG tablet Take 1 tablet (10 mg total) by mouth daily. (Patient taking differently: Take 10 mg by mouth 2 (two) times a week.) 90 tablet 3   hydrocortisone (CORTEF) 10 MG tablet Please take 10 mg in am and 5 mg in PM. Please allow for EMG dosing 160 tablet 3   ibuprofen (ADVIL) 200 MG tablet Take 400 mg by mouth every 6 (six) hours as needed for moderate pain.     levothyroxine (SYNTHROID) 150 MCG tablet TAKE 1 TABLET BY MOUTH EVERY OTHER DAY 30 tablet 3   levothyroxine (SYNTHROID) 175 MCG tablet TAKE 1 TABLET BY MOUTH EVERY OTHER DAY 30 tablet 3   loratadine (CLARITIN) 10 MG tablet Take 10 mg by mouth daily as needed for allergies.     Multiple Vitamins-Minerals (CENTRUM SILVER 50+MEN) TABS Take 1 tablet by mouth daily with breakfast.     mupirocin ointment (BACTROBAN) 2 % Place 1 Application into the nose daily as needed (sores).     oxyCODONE-acetaminophen (PERCOCET/ROXICET) 5-325 MG tablet Take 1 tablet by mouth every 6 (six) hours as needed for severe pain. 30 tablet 0   traZODone (DESYREL) 150 MG tablet Take 150 mg by mouth at bedtime as needed for sleep.     VITAMIN D PO Take 1 capsule by mouth daily.     cabozantinib (CABOMETYX) 20 MG tablet Take 1 tablet (20 mg total) by mouth daily. Take on an empty stomach, 1 hour before or 2 hours after meals. 30 tablet 1   fluticasone (FLONASE) 50 MCG/ACT nasal spray Place 2 sprays into both nostrils daily as needed for allergies.     morphine (MS CONTIN) 15 MG 12 hr tablet Take 1 tablet (15 mg total) by mouth every 12 (twelve) hours. 30 tablet 0    Results for orders placed or performed during the hospital encounter of  05/22/22 (from the past 48 hour(s))  CBC     Status: Abnormal   Collection Time: 05/22/22  6:39 AM  Result Value Ref Range   WBC 6.9 4.0 - 10.5 K/uL   RBC 3.07 (L) 4.22 - 5.81 MIL/uL   Hemoglobin 8.9 (L) 13.0 - 17.0 g/dL   HCT 86.7 (L) 61.9 - 50.9 %   MCV 95.8 80.0 - 100.0 fL   MCH 29.0 26.0 - 34.0 pg   MCHC 30.3 30.0 - 36.0 g/dL   RDW 32.6 (H) 71.2 - 45.8 %   Platelets 265 150 - 400 K/uL   nRBC 0.0 0.0 - 0.2 %    Comment: Performed at [facility], [address], [place], [place] [address]   No results found.  ROS:  ROS  Blood pressure (!) 140/67, pulse 81, temperature 97.8 F (36.6 C), temperature source Oral, resp. rate 18, height 5\' 8"  (1.727 m), weight 78 kg, SpO2 94 %.  PHYSICAL EXAM: Physical Exam Constitutional:      Appearance: Normal appearance.  Pulmonary:     Effort: Pulmonary effort is normal.  Neurological:     General: No focal deficit present.     Mental Status: He is alert and oriented to person, place, and time.  Psychiatric:        Mood and Affect: Mood normal.        Behavior: Behavior normal.     Studies Reviewed: CT sinus with and w/o contrast reviewed. Mass of left masticator space, causing erosion of posterolateral wall of left maxillary sinus.   Assessment/Plan Vineet Larrison is a 77 y.o. male with history of stage IV renal cell carcinoma presenting for evaluation due to recent maxillofacial CT scan demonstrating persistent circumferential mucosal thickening in the left maxillary sinus with bony erosion of the posterior lateral wall of the maxillary sinus. Nasal endoscopy performed 03/27 demonstrated findings consistent with probable metastatic disease with superimposed infection.  -To OR for left maxillary antrostomy and biopsy under general anesthesia in the operating room.     Shawndra Clute A Hazelynn Mckenny 05/22/2022, 7:32 AM

## 2022-05-23 LAB — AEROBIC/ANAEROBIC CULTURE W GRAM STAIN (SURGICAL/DEEP WOUND): Culture: NO GROWTH

## 2022-05-24 LAB — AEROBIC/ANAEROBIC CULTURE W GRAM STAIN (SURGICAL/DEEP WOUND)

## 2022-05-24 NOTE — Anesthesia Postprocedure Evaluation (Signed)
Anesthesia Post Note  Patient: [name]  Procedure(s) Performed: SINUS ENDO WITH FUSION (Left) MAXILLARY ANTROSTOMY (Left: Nose)     Patient location during evaluation: PACU Anesthesia Type: General Level of consciousness: awake and alert Pain management: pain level controlled Vital Signs Assessment: post-procedure vital signs reviewed and stable Respiratory status: spontaneous breathing, nonlabored ventilation, respiratory function stable and patient connected to nasal cannula oxygen Cardiovascular status: blood pressure returned to baseline and stable Postop Assessment: no apparent nausea or vomiting Anesthetic complications: yes  Encounter Notable Events  Notable Event Outcome Phase Comment  Difficult to intubate - expected  Intraprocedure Filed from anesthesia note documentation.    Last Vitals:  Vitals:   05/22/22 0945 05/22/22 1000  BP: 135/68 135/65  Pulse: 82 78  Resp: 14 14  Temp:  37.2 C  SpO2: 94% 95%    Last Pain:  Vitals:   05/22/22 0930  TempSrc:   PainSc: 0-No pain                 Anita Mcadory

## 2022-05-25 ENCOUNTER — Encounter (HOSPITAL_COMMUNITY): Payer: Self-pay | Admitting: Otolaryngology

## 2022-05-25 LAB — SURGICAL PATHOLOGY

## 2022-05-25 LAB — AEROBIC/ANAEROBIC CULTURE W GRAM STAIN (SURGICAL/DEEP WOUND)

## 2022-05-26 ENCOUNTER — Telehealth: Payer: Self-pay | Admitting: Medical Oncology

## 2022-05-26 ENCOUNTER — Other Ambulatory Visit (HOSPITAL_COMMUNITY): Payer: Self-pay

## 2022-05-26 LAB — MISC LABCORP TEST (SEND OUT): Labcorp test code: 8474

## 2022-05-26 NOTE — Telephone Encounter (Signed)
Requests ENT biopsy result. HE does not have a f/u appt with Dr. Arbutus Ped.

## 2022-05-26 NOTE — Telephone Encounter (Signed)
Per Dr. Arbutus Ped , I told [name] "Biopsy showed no evidence of cancer.  Please f/u with ENT  who did the biopsy for further explanation regarding the inflammation and treatment.  I can see him back for follow-up visit in few weeks'.    Schedule message sent.

## 2022-05-27 ENCOUNTER — Other Ambulatory Visit (HOSPITAL_COMMUNITY): Payer: Self-pay

## 2022-05-27 ENCOUNTER — Other Ambulatory Visit: Payer: Self-pay | Admitting: Internal Medicine

## 2022-05-27 ENCOUNTER — Telehealth: Payer: Self-pay | Admitting: Medical Oncology

## 2022-05-27 DIAGNOSIS — C649 Malignant neoplasm of unspecified kidney, except renal pelvis: Secondary | ICD-10-CM

## 2022-05-27 LAB — AEROBIC/ANAEROBIC CULTURE W GRAM STAIN (SURGICAL/DEEP WOUND)

## 2022-05-27 LAB — ORGANISM IDENTIFICATION, MOLD

## 2022-05-27 LAB — MOLD ORGANISM REFLEX

## 2022-05-27 NOTE — Telephone Encounter (Signed)
Dtr asking for plan of care. I told her Dr Arbutus Ped has not spoken with pt ENT surgeon

## 2022-05-28 ENCOUNTER — Telehealth: Payer: Self-pay | Admitting: Physician Assistant

## 2022-05-28 LAB — ORGANISM IDENTIFICATION, MOLD

## 2022-05-28 NOTE — Progress Notes (Signed)
Palliative Medicine [facility]  [phone] Fax:([phone]) [phone]   Name: [name] Date: 05/28/2022 MRN: [id number]  DOB: [birth year]  Patient Care Team: [name], MD as PCP - General (Family Medicine) Thurmon Fair, MD as PCP - Cardiology (Cardiology) Karie Soda, MD as Consulting Physician (General Surgery) Crist Fat, MD as Consulting Physician (Urology)    REASON FOR CONSULTATION: [name] is a 77 y.o. 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 Palliative ask to see for symptom and pain management and goals of care.    SOCIAL HISTORY:     reports that he quit smoking about 34 years ago. His smoking use included cigarettes. He has a 20.00 pack-year smoking history. He has never used smokeless tobacco. He reports that he does not currently use alcohol. He reports that he does not use drugs.  ADVANCE DIRECTIVES:  None on file  CODE STATUS: Full code  PAST MEDICAL HISTORY: Past Medical History:  Diagnosis Date   Arthritis    Chronic kidney disease    only has one kidney    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 02/01/2015   Coronary artery disease    followed by Dr.Tilley   Frequent PVCs    GERD (gastroesophageal reflux disease)    Heart murmur    never has caused any problems   Hx of cancer of lung 1980's   Hyperlipidemia    Hypertension    Hypothyroidism    Incisional hernia 08/01/2015   Lung cancer 1993   Lung metastases 2019   Pneumonia    x several   Recurrent umbilical hernia 08/01/2015    PAST SURGICAL HISTORY:  Past Surgical History:  Procedure Laterality Date   APPENDECTOMY  age 57   ARTERY BIOPSY Left 04/10/2022   Procedure: LEFT TEMPORAL ARTERY BIOPSY;  Surgeon: Chuck Hint, MD;  Location: [facility] OR;  Service: Vascular;   Laterality: Left;   CHOLECYSTECTOMY  10/18/1978   EYE SURGERY Bilateral 2019   cataract   LAPAROSCOPIC LYSIS OF ADHESIONS N/A 08/01/2015   Procedure: LAPAROSCOPIC LYSIS OF ADHESIONS;  Surgeon: Karie Soda, MD;  Location: [facility] ORS;  Service: General;  Laterality: N/A;   LUNG LOBECTOMY  02/16/1990   lung cancer- patient has staples in lung not to have MRI per patient    MAXILLARY ANTROSTOMY Left 05/22/2022   Procedure: MAXILLARY ANTROSTOMY;  Surgeon: Laren Boom, DO;  Location: [facility] OR;  Service: ENT;  Laterality: Left;   PILONIDAL CYST EXCISION  02/16/2009   Dr Gerrit Friends   ROBOTIC ASSITED PARTIAL NEPHRECTOMY Right 02/01/2015   Procedure: RIGHT ROBOTIC ASSISTED LAPAROSOCOPY NEPHRECTOMY;  Surgeon: Crist Fat, MD;  Location: [facility] ORS;  Service: Urology;  Laterality: Right;   SINUS ENDO WITH FUSION Left 05/22/2022   Procedure: SINUS ENDO WITH FUSION;  Surgeon: Laren Boom, DO;  Location: [facility] OR;  Service: ENT;  Laterality: Left;   VENTRAL HERNIA REPAIR N/A 08/01/2015   Procedure: LAPAROSCOPIC VENTRAL WALL HERNIA WITH MESH;  Surgeon: Karie Soda, MD;  Location: [facility] OR[facility];  Service: General;  Laterality: N/A;    HEMATOLOGY/ONCOLOGY HISTORY:  Oncology History  Kidney cancer, primary, with metastasis from kidney to other site  09/14/2017 Initial Diagnosis   Kidney cancer, primary, with metastasis from kidney to other site [facility])   09/21/2017 - 12/30/2017 Chemotherapy   The patient had ipilimumab (YERVOY)  100 mg in sodium chloride 0.9 % 50 mL chemo infusion, 1.06 mg/kg = 95 mg, Intravenous,  Once, 4 of 4 cycles Administration: 100 mg (09/21/2017), 100 mg (10/13/2017), 100 mg (11/03/2017), 100 mg (11/24/2017) nivolumab (OPDIVO) 280 mg in sodium chloride 0.9 % 100 mL chemo infusion, 2.93 mg/kg = 287 mg, Intravenous, Once, 6 of 9 cycles Administration: 280 mg (09/21/2017), 240 mg (12/15/2017), 280 mg (10/13/2017), 280 mg (11/03/2017), 280 mg (11/24/2017), 240 mg (12/30/2017)  for chemotherapy treatment.     03/24/2018 - 03/24/2018 Chemotherapy   The patient had nivolumab (OPDIVO) 480 mg in sodium chloride 0.9 % 100 mL chemo infusion, 480 mg, Intravenous, Once, 0 of 6 cycles  for chemotherapy treatment.      ALLERGIES:  is allergic to diphenhydramine.  MEDICATIONS:  Current Outpatient Medications  Medication Sig Dispense Refill   acetaminophen (TYLENOL) 500 MG tablet Take 1,000 mg by mouth every 6 (six) hours as needed for moderate pain.     cabozantinib (CABOMETYX) 20 MG tablet Take 1 tablet (20 mg total) by mouth daily. Take on an empty stomach, 1 hour before or 2 hours after meals. 30 tablet 1   diltiazem (CARDIZEM CD) 240 MG 24 hr capsule Take 1 capsule (240 mg total) by mouth in the morning. 90 capsule 3   ELIQUIS 5 MG TABS tablet Take 1 tablet (5 mg total) by mouth 2 (two) times daily. 60 tablet 2   ezetimibe (ZETIA) 10 MG tablet Take 1 tablet (10 mg total) by mouth daily. (Patient taking differently: Take 10 mg by mouth 2 (two) times a week.) 90 tablet 3   fluticasone (FLONASE) 50 MCG/ACT nasal spray Place 2 sprays into both nostrils daily as needed for allergies.     hydrocortisone (CORTEF) 10 MG tablet Please take 10 mg in am and 5 mg in PM. Please allow for EMG dosing 160 tablet 3   levothyroxine (SYNTHROID) 150 MCG tablet TAKE 1 TABLET BY MOUTH EVERY OTHER DAY 30 tablet 3   levothyroxine (SYNTHROID) 175 MCG tablet TAKE 1 TABLET BY MOUTH EVERY OTHER DAY 30 tablet 3   loratadine (CLARITIN) 10 MG tablet Take 10 mg by mouth daily as needed for allergies.     morphine (MS CONTIN) 15 MG 12 hr tablet Take 1 tablet (15 mg total) by mouth every 12 (twelve) hours. 30 tablet 0   Multiple Vitamins-Minerals (CENTRUM SILVER 50+MEN) TABS Take 1 tablet by mouth daily with breakfast.     mupirocin ointment (BACTROBAN) 2 % Place 1 Application into the nose daily as needed (sores).     oxyCODONE-acetaminophen (PERCOCET/ROXICET) 5-325 MG tablet Take 1 tablet by mouth every 6 (six) hours as needed for  severe pain. 30 tablet 0   traZODone (DESYREL) 150 MG tablet Take 150 mg by mouth at bedtime as needed for sleep.     VITAMIN D PO Take 1 capsule by mouth daily.     No current facility-administered medications for this visit.    VITAL SIGNS: There were no vitals taken for this visit. There were no vitals filed for this visit.  Estimated body mass index is 26.15 kg/m as calculated from the following:   Height as of 05/22/22: 5\' 8"  (1.727 m).   Weight as of 05/22/22: 172 lb (78 kg).  LABS: CBC:    Component Value Date/Time   WBC 6.9 05/22/2022 0639   HGB 8.9 (L) 05/22/2022 0639   HGB 11.0 (L) 02/06/2022 0838   HGB 13.3 03/11/2018 0913   HCT 29.4 (  L) 05/22/2022 0639   HCT 40.1 03/11/2018 0913   PLT 265 05/22/2022 0639   PLT 229 02/06/2022 0838   PLT 248 03/11/2018 0913   MCV 95.8 05/22/2022 0639   MCV 86 03/11/2018 0913   NEUTROABS 7.4 04/20/2022 1256   NEUTROABS 2.5 03/11/2018 0913   LYMPHSABS 0.7 04/20/2022 1256   LYMPHSABS 3.0 03/11/2018 0913   MONOABS 0.5 04/20/2022 1256   EOSABS 0.1 04/20/2022 1256   EOSABS 0.7 (H) 03/11/2018 0913   BASOSABS 0.0 04/20/2022 1256   BASOSABS 0.1 03/11/2018 0913   Comprehensive Metabolic Panel:    Component Value Date/Time   NA 135 05/22/2022 0639   NA 141 08/11/2021 1252   K 4.4 05/22/2022 0639   CL 99 05/22/2022 0639   CO2 26 05/22/2022 0639   BUN 16 05/22/2022 0639   BUN 14 08/11/2021 1252   CREATININE 1.35 (H) 05/22/2022 0639   CREATININE 1.36 (H) 02/06/2022 0838   GLUCOSE 93 05/22/2022 0639   CALCIUM 8.6 (L) 05/22/2022 0639   AST 12 (L) 05/22/2022 0639   AST 20 02/06/2022 0838   ALT 15 05/22/2022 0639   ALT 20 02/06/2022 0838   ALKPHOS 61 05/22/2022 0639   BILITOT 0.6 05/22/2022 0639   BILITOT 0.3 02/06/2022 0838   PROT 6.3 (L) 05/22/2022 0639   PROT 6.9 03/11/2018 0913   ALBUMIN 2.8 (L) 05/22/2022 0639   ALBUMIN 4.5 03/11/2018 0913    RADIOGRAPHIC STUDIES: CT Abdomen Pelvis Wo Contrast  Result Date:  05/05/2022 CLINICAL DATA:  A 77 year old male with history of clear cell carcinoma of the RIGHT kidney presents for follow-up. * Tracking Code: BO * EXAM: CT ABDOMEN AND PELVIS WITHOUT CONTRAST TECHNIQUE: Multidetector CT imaging of the abdomen and pelvis was performed following the standard protocol without IV contrast. RADIATION DOSE REDUCTION: This exam was performed according to the departmental dose-optimization program which includes automated exposure control, adjustment of the mA and/or kV according to patient size and/or use of iterative reconstruction technique. COMPARISON:  February 06, 2022 FINDINGS: Lower chest: Basilar scarring on the RIGHT. Please see dedicated chest CT for further detail. Chest CT acquired concurrently and reported separately. Extensive coronary artery calcification incidentally noted. Postoperative changes about the RIGHT hilum similar to previous imaging. Hepatobiliary: Stable low-density hepatic lesion in the cephalad aspect of the medial segment of the LEFT hepatic lobe (15/3) 4.0 cm greatest axial dimension. Post cholecystectomy. Pancreas: Ill-defined mass in the area of the uncinate process of the pancreas not well delineated in the absence of contrast measuring approximately 4.9 x 5.0 cm, previously approximately 4.8 x 4.8 cm greatest axial dimension measured in a similar fashion and definitively larger than in August of 2023. Adjacent nodules or lymph nodes are more conspicuous/better defined the current study than on prior exam, for instance image 37/3 there is a separate 11 mm lymph node is measured in short axis dimension that new or enlarged since December (small lymph nodes present about the pancreatic head on previous imaging). On image 61/6 there is a 16 mm soft tissue nodule or lymph node that previously measured 10 mm. No pancreatic ductal dilation or signs peripheral pancreatic inflammation. There is stranding about the pancreatic, this is been present since  previous imaging. Spleen: Normal size and contour. Adrenals/Urinary Tract: RIGHT adrenal gland is normal as is the LEFT adrenal gland. Signs of fat necrosis in the nephrectomy bed without change Perinephric stranding on the LEFT. No change in renal contour. No signs of hydronephrosis. No ureteral dilation or perivesical  stranding. Stomach/Bowel: Stomach without signs of adjacent stranding. Small bowel without signs of obstruction. Uncinate process mass inseparable from the 2nd-3rd portion of the duodenum. Colonic diverticulosis greatest in the sigmoid colon. Vascular/Lymphatic: Atherosclerotic changes of the abdominal aorta without aneurysm. Scattered retroperitoneal lymph nodes are similar to previous imaging. Lymph node adjacent to the pancreatic head as outlined above. Reproductive: Unremarkable by CT to the extent evaluated. Other: No ascites. Musculoskeletal: No acute bone finding. No destructive bone process. Spinal degenerative changes. IMPRESSION: 1. Ill-defined mass in the area of the uncinate process of the pancreas with continued enlargement. 2. Enlarging adjacent soft tissue nodules or lymph nodes as outlined above. Potential new soft tissue nodule or lymph node since most recent imaging. 3. Stable mildly enlarged retroperitoneal lymph nodes along the LEFT para-aortic chain remains suspicious but are unchanged. 4. Stable low-density a hepatic lesion likely benign hemorrhagic or proteinaceous cyst. 5. Signs of fat necrosis in the nephrectomy bed without change. 6. Colonic diverticulosis greatest in the sigmoid colon. 7. Extensive coronary artery calcification incidentally noted. 8. Aortic atherosclerosis. 9. Chest CT acquired concurrently and reported separately. Aortic Atherosclerosis (ICD10-I70.0). Electronically Signed   By: Donzetta Kohut M.D.   On: 05/05/2022 08:32   CT MAXILLOFACIAL WO CONTRAST  Result Date: 05/05/2022 CLINICAL DATA:  Kidney cancer.  Staging.  Jaw pain. EXAM: CT MAXILLOFACIAL  WITHOUT CONTRAST TECHNIQUE: Multidetector CT imaging of the maxillofacial structures was performed. Multiplanar CT image reconstructions were also generated. RADIATION DOSE REDUCTION: This exam was performed according to the departmental dose-optimization program which includes automated exposure control, adjustment of the mA and/or kV according to patient size and/or use of iterative reconstruction technique. COMPARISON:  01/28/2022 FINDINGS: Osseous: There is a destructive process involving the posterolateral wall of the left maxillary sinus with extension into the left masticator space. In the setting of metastatic carcinoma, this is presumed to be secondary to metastatic disease. Without history of cancer, aggressive infection such as fungal infection could also be considered. The process is progressive since December, at which time there were some early bony changes, but soft tissues of the face were not characterized based on the technical nature of the study. No other bony finding seen in the region. Orbits: Orbits are normal. Sinuses: Improvement in previously seen mucosal inflammatory changes of the paranasal sinuses. Mild mucosal thickening persists in the right maxillary sinus. Moderate mucosal thickening persists in the left maxillary sinus. Soft tissues: As noted above, a mass lesion involving the left masticator space and posterior wall of the left maxillary sinus. This is presumed represent metastatic disease from the described metastatic cancer. The differential diagnosis does include aggressive infection such as fungal disease. Limited intracranial: No intracranial abnormality is seen. IMPRESSION: 1. Destructive process involving the posterolateral wall of the left maxillary sinus with extension into the left masticator space. In the setting of metastatic carcinoma, this is presumed to be secondary to metastatic disease. Without a history of cancer, aggressive infection such as fungal infection  could also be considered. The process is progressive since December, at which time there were some early bony changes, but the soft tissues of the face were not characterized based on the technical nature of the study. 2. Improvement in previously seen mucosal inflammatory changes of the paranasal sinuses. Mild mucosal thickening persists in the right maxillary sinus. Moderate mucosal thickening persists in the left maxillary sinus. Electronically Signed   By: Paulina Fusi M.D.   On: 05/05/2022 08:16    PERFORMANCE STATUS (ECOG) : 1 -  Symptomatic but completely ambulatory  Review of Systems  Constitutional:  Positive for appetite change and fatigue.  Musculoskeletal:  Positive for arthralgias.   Unless otherwise noted, a complete review of systems is negative.  Physical Exam General: NAD Cardiovascular: regular rate and rhythm Pulmonary: clear ant fields Abdomen: soft, nontender, + bowel sounds Extremities: no edema, no joint deformities Skin: no rashes Neurological:AAO x4  IMPRESSION: This is my initial visit with [name]. No acute distress noted. He is alert and able to engage in discussions. Daughter, [name] is present.   I introduced myself, [place] RN, and Palliative's role in collaboration with the oncology team. Concept of Palliative Care was introduced as specialized medical care for people and their families living with serious illness.  It focuses on providing relief from the symptoms and stress of a serious illness.  The goal is to improve quality of life for both the patient and the family. Values and goals of care important to patient and family were attempted to be elicited.   [name] lives by himself with strong family support. 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  Ambulatory without assistive devices.  Is able to perform all ADLs independently in the home with some fatigue.  Denies nausea, vomiting, constipation or diarrhea.  Does report he is not sleeping well due to  pain and having to get up and down throughout the night.  Neoplasm related pain  Jaysion reports his pain is constant.  Describes it as sharp, stabbing, radiating from right jaw down to neck.  Rates his pain 10 out of 10.  Decreases to 2 out of 10 with medication only.  Has to take medication every 5-6 hours as pain begins to escalate.  We discussed his current pain regimen. He has MS Contin 15mg  twice daily on hand and Percocet 1 tab every 6 hours as needed.  He is not currently taking his MS Contin as he was confused if he was to take as needed similar to his Percocet.  Reports better relief with his Percocet compared to the MS Contin.  Acknowledges he did take for 1-1/2 days but then discontinued.  Education provided on the differences between long-acting and short acting medications.  Encourage patient to restart MS Contin every 12 hours as additional support to his breakthrough medication.  Patient and daughter verbalized understanding and plans to restart tonight.  Complete physical, medication, medical, and psychosocial review completed.  Patient denies any use of illicit drugs or alcohol. Understands if ever positive this would be grounds for no longer receiving any further opioids. Extensive discussions and explanation of palliative's role in collaboration with his oncology team to assist in his pain and symptom management. Education provided pain contract and guidelines for ongoing support including terms for dismissal if contract is broken. She verbalized understanding. Pain contract completed.    2.  Decreased appetite/weight loss [name] continues to have decreased appetite due to inability to eat certain foods and open mouth properly.  When he does eat he requires liquids or soft foods.  Endorses minimal desire to eat at times due to this.  Acknowledges approximately weight loss over the past month 15 pounds.  Current weight is 166 pounds down from 174 pounds on 3/19.  Education provided on  small frequent meals utilizing foods that are soft, increasing protein via smoothies and protein drinks such as Ensure, boost, Premier protein.  Encouraged him to drink protein supplements at least 2-3 times daily.  Patient declines referral to  Dietitian at this time.  3. Constipation Denies constipation with daily bowel movements.  Education provided on the use of daily stool softener in the setting of opioid use.  4. Goals of Care  We discussed [name] current illness and what it means in the larger context of his on-going co-morbidities. Natural disease trajectory and expectations were discussed.  He and daughter are realistic in their understanding.  Appropriately expressed plans for pending discussions at tumor board recognizing this with allow medical team to further discuss plan of care.  Patient is open to radiation if needed.  He is remaining hopeful for stability.  Wishes to continue to treat allow him every opportunity to continue to thrive.  I discussed the importance of continued conversation with family and their medical providers regarding overall plan of care and treatment options, ensuring decisions are within the context of the patients values and GOCs.  PLAN: Established therapeutic relationship. Education provided on palliative's role in collaboration with their Oncology/Radiation team. MS Contin 15 mg every 12 hours.  Patient will begin taking as prescribed with first dose tonight. Percocet 1 tablet every 6 hours as needed for breakthrough pain. MiraLAX daily. Ongoing symptom management support. I will plan to see patient back in 2-4 weeks in collaboration to other oncology appointments.  We will plan to touch base with patient by phone in a week for close follow-up.   Patient expressed understanding and was in agreement with this plan. He also understands that He can call the clinic at any time with any questions, concerns, or complaints.   Thank you for your referral  and allowing Palliative to assist in [name]'s care.   Number and complexity of problems addressed: HIGH - 1 or more chronic illnesses with SEVERE exacerbation, progression, or side effects of treatment - advanced cancer, pain. Any controlled substances utilized were prescribed in the context of palliative care.   Visit consisted of counseling and education dealing with the complex and emotionally intense issues of symptom management and palliative care in the setting of serious and potentially life-threatening illness.Greater than 50%  of this time was spent counseling and coordinating care related to the above assessment and plan.  Signed by: Willette Alma, AGPCNP-BC Palliative Medicine Team/[facility]   *Please note that this is a verbal dictation therefore any spelling or grammatical errors are due to the "[employer] One" system interpretation.

## 2022-05-28 NOTE — Telephone Encounter (Signed)
I called the patients daughter. Dr. Arbutus Ped talked to Dr. Marene Lenz, and he placed an urgent referral for him to see radiation oncology for consideration of palliative radiotherapy. Dr. Marene Lenz still believes that there is cancer there based on her intervention and imaging studies. I called the patient's daughter to let her know to be on the look out for a call from radiation oncology and what this is for. She expressed understanding

## 2022-05-29 ENCOUNTER — Telehealth: Payer: Self-pay | Admitting: Internal Medicine

## 2022-05-29 NOTE — Telephone Encounter (Signed)
Scheduled per 04/11 in-basket message, patient is notified.

## 2022-06-01 ENCOUNTER — Other Ambulatory Visit (HOSPITAL_COMMUNITY): Payer: Self-pay

## 2022-06-01 DIAGNOSIS — R6884 Jaw pain: Secondary | ICD-10-CM | POA: Diagnosis not present

## 2022-06-01 DIAGNOSIS — J329 Chronic sinusitis, unspecified: Secondary | ICD-10-CM | POA: Diagnosis not present

## 2022-06-01 DIAGNOSIS — Z9889 Other specified postprocedural states: Secondary | ICD-10-CM | POA: Diagnosis not present

## 2022-06-01 DIAGNOSIS — J3489 Other specified disorders of nose and nasal sinuses: Secondary | ICD-10-CM | POA: Diagnosis not present

## 2022-06-01 DIAGNOSIS — J320 Chronic maxillary sinusitis: Secondary | ICD-10-CM | POA: Diagnosis not present

## 2022-06-01 DIAGNOSIS — R252 Cramp and spasm: Secondary | ICD-10-CM | POA: Diagnosis not present

## 2022-06-01 DIAGNOSIS — B49 Unspecified mycosis: Secondary | ICD-10-CM | POA: Diagnosis not present

## 2022-06-01 NOTE — Progress Notes (Signed)
Radiation Oncology         [phone] ________________________________  Initial Outpatient Consultation  Name: [name] MRN: [id number]  Date: 06/02/2022  DOB: [birth year]  WU:[id number], Curly Rim, MD  Si Gaul, MD   REFERRING PHYSICIAN: Si Gaul, MD  DIAGNOSIS: No diagnosis found.  Destructive process involving the posterolateral wall of the left maxillary sinus with extension into the left masticator space - biopsy without evidence of malignancy  History of stage IV renal cell carcinoma diagnosed initially in 2016: s/p radical right renal nephrectomy. He developed widely metastatic disease in 2019 with involvement of the lung, liver, omentum, retrocaval lymph nodes: s/p immunotherapy and radiation therapy   CHIEF COMPLAINT: Here to discuss management of a left maxillary sinus mass  HISTORY OF PRESENT ILLNESS::[name] is a 77 y.o. male who presented to Dr. Marene Lenz on 01/20/2022 for recurrent sinusitis and facial pain. At that time, nasal endoscopy was unremarkable, and dedicated CT sinus was recommended for further evaluation. Imaging demonstrated widespread mucosal thickening throughout the paranasal sinuses, worse in the left maxillary sinus consistent with rhinosinusitis and nasal septal deviation (CT report per Dr. Marene Lenz). He was subsequently treated with oral antibiotics and steroids, and noted mild improvement in his symptoms.  As detailed below, the patient was already established with oncology for management of metastatic renal cell carcinoma (Dr. Clelia Croft)  Follow-up CT AP on 02/06/22 showed relatively stable disease, demonstrated by:  further slow growth of ill-defined mass involving the uncinate process of the pancreas consistent with progressive metastatic renal cell carcinoma; slight enlargement of small left aortic lymph nodes possibly reflecting metastatic disease; stability of a presumably benign low-density subcapsular lesion in the anterior  liver, presumably benign; stable postsurgical changes from previous right middle and lower lobectomy; and stable small left lower lobe pulmonary nodules.  During a follow-up visit with Dr. Clelia Croft on 02/17/22, the patient was noted to be tolerating treatment with Cabometyx well without any issues. He however endorsed ongoing issues with jaw pain which had been present for several months. Per encounter notes, he had several CT scans in the past which showed no evidence of malignancy in that area. Given his relatively stable metastatic renal disease (based on his CT noted above), Dr. Clelia Croft recommended continuing with the same dose of Carbometyx.   The patient was referred to vascular surgery, Dr. Chestine Spore, on 03/31/22 for further evaluation of his jaw pain. Dr. Chestine Spore noted his symptoms are consistent with temporal arteritis and subsequently recommended a left temporal artery biopsy to rule this out.   Biopsy of the left temporal artery on 04/10/22 showed a segment of a small caliber artery with no specific histopathologic changes. Path was also negative for chronic inflammation, giant cells, granulomas or fragmentation of elastic lamina.   The patient established care with Dr. Arbutus Ped on 04/20/22 due to Dr. Clelia Croft leaving his practice. During this visit, the patient reported mouth sores as well as inability to open his mouth fully (the patient was noted to have been seen by his dentist for this and had a sinus scan performed which was unremarkable). In light of his symptoms, his Cabometyx was held several days prior to this to see if there was any correlation between treatment and his symptoms. Dr. Arbutus Ped ultimately recommended holding Cabometyx for a few more days. If his symptoms continued to persist, he was instructed to resume Cabometyx at the same dose.   Maxillofacial CT on 05/03/22 for further evaluation of his pain demonstrated: a destructive process involving the posterolateral  wall of the left  maxillary sinus with extension into the left masticator space. In the setting of metastatic carcinoma, findings were presumed to be secondary to metastatic disease versus a fungal infection. CT also otherwise showed improvement in the previously seen mucosal inflammatory changes of the paranasal sinuses.   He also presented for a follow-up CT AP on 05/03/22 which showed: further interval enlargement of the ill defined mass in the area of the uncinate process of the pancreas, along with adjacent enlarging soft tissue nodules/or lymph nodes, and a potential new soft tissue nodule or lymph node. CT otherwise showed stability of the mildly enlarged retroperitoneal lymph nodes along the left para-aortic chain (remains suspicious but are unchanged), and stability of the low density likely benign hepatic lesion.   The patient resumed  his Cabometyx during the week of 04/26/21. During a visit with Dr. Asa Lente office on 05/04/21, he denied any new symptoms or changes in his existing symptoms. However, in light of his recent maxillofacial CT which showed destructive process involving the posterolateral wall of the left maxillary sinus, Dr. Arbutus Ped recommended holding Cabometyx and proceeding with biopsies.   Accordingly, the patient underwent biopsies of the left sinus along with excision of the masticator space mass on 05/22/22 under Dr. Marene Lenz. Pathology showed benign findings without evidence of malignancy in either specimen, with some features suggestive of sinonasal inflammatory polyp(s) in the left sinus.  Despite negative pathology, Dr. Marene Lenz is still concerned that his condition is malignant based on imaging studies and his presentation. In light of this, the patient was referred to me for consideration of palliative radiation therapy which we will discuss in detail today.   Dr. Marene Lenz has also prescribed him a prednisone taper to help with his inflammation and pain.   Swallowing issues, if  any: ***  Weight Changes: none  Pain status: jaw pain / facial pain  Other symptoms: left mid face numbness, trismus and intraoral soreness   Tobacco history, if any: 20 pack year smoking history, quit 30 years ago   ETOH abuse, if any: none  Prior cancers, if any: Yes, history of stage IV renal cell carcinoma diagnosed initially in 2016: s/p radical right renal nephrectomy. He developed widely metastatic disease in 2019 with involvement of the lung, liver, omentum, retrocaval lymph nodes: s/p radiation therapy targeted to abdominal lymphadenopathy completed on November 08, 2019 . He was also treated with immunotherapy consisting of Opdivo-year Voora for 4 cycles (Dr. Arbutus Ped) but developed adrenal insufficiency and panhypopituitarism. He is currently on Cabometyx since March of 2023, which was reduced to 20 mg p.o. daily in April 2023.   PREVIOUS RADIATION THERAPY: Yes - Dr. Roselind Messier   Diagnosis: Stage IV clear cell renal cell carcinoma with hepatic metastasis and omental involvement   Radiation Treatment Dates: 10/26/2019 through 11/08/2019 Site Technique Total Dose (Gy) Dose per Fx (Gy) Completed Fx Beam Energies  Abdomen: Abd IMRT 30/30 3 10/10 10X    PAST MEDICAL HISTORY:  has a past medical history of Arthritis, Chronic kidney disease, 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 (02/01/2015), Coronary artery disease, Frequent PVCs, GERD (gastroesophageal reflux disease), Heart murmur, cancer of lung (1980's), Hyperlipidemia, Hypertension, Hypothyroidism, Incisional hernia (08/01/2015), Lung cancer (1993), Lung metastases (2019), Pneumonia, and Recurrent umbilical hernia (08/01/2015).    PAST SURGICAL HISTORY: Past Surgical History:  Procedure Laterality Date   APPENDECTOMY  age 73   ARTERY BIOPSY Left 04/10/2022   Procedure: LEFT TEMPORAL ARTERY BIOPSY;  Surgeon: Waverly Ferrari  S, MD;  Location: [facility] OR;  Service: Vascular;  Laterality: Left;   CHOLECYSTECTOMY   10/18/1978   EYE SURGERY Bilateral 2019   cataract   LAPAROSCOPIC LYSIS OF ADHESIONS N/A 08/01/2015   Procedure: LAPAROSCOPIC LYSIS OF ADHESIONS;  Surgeon: Karie Soda, MD;  Location: [facility];  Service: General;  Laterality: N/A;   LUNG LOBECTOMY  02/16/1990   lung cancer- patient has staples in lung not to have MRI per patient    MAXILLARY ANTROSTOMY Left 05/22/2022   Procedure: MAXILLARY ANTROSTOMY;  Surgeon: Laren Boom, DO;  Location: [facility] OR;  Service: ENT;  Laterality: Left;   PILONIDAL CYST EXCISION  02/16/2009   Dr Gerrit Friends   ROBOTIC ASSITED PARTIAL NEPHRECTOMY Right 02/01/2015   Procedure: RIGHT ROBOTIC ASSISTED LAPAROSOCOPY NEPHRECTOMY;  Surgeon: Crist Fat, MD;  Location: [facility];  Service: Urology;  Laterality: Right;   SINUS ENDO WITH FUSION Left 05/22/2022   Procedure: SINUS ENDO WITH FUSION;  Surgeon: Laren Boom, DO;  Location: [facility];  Service: ENT;  Laterality: Left;   VENTRAL HERNIA REPAIR N/A 08/01/2015   Procedure: LAPAROSCOPIC VENTRAL WALL HERNIA WITH MESH;  Surgeon: Karie Soda, MD;  Location: [facility];  Service: General;  Laterality: N/A;    FAMILY HISTORY: family history includes Heart attack in his father.  SOCIAL HISTORY:  reports that he quit smoking about 34 years ago. His smoking use included cigarettes. He has a 20.00 pack-year smoking history. He has never used smokeless tobacco. He reports that he does not currently use alcohol. He reports that he does not use drugs.  ALLERGIES: Diphenhydramine  MEDICATIONS:  Current Outpatient Medications  Medication Sig Dispense Refill   acetaminophen (TYLENOL) 500 MG tablet Take 1,000 mg by mouth every 6 (six) hours as needed for moderate pain.     cabozantinib (CABOMETYX) 20 MG tablet Take 1 tablet (20 mg total) by mouth daily. Take on an empty stomach, 1 hour before or 2 hours after meals. 30 tablet 1   diltiazem (CARDIZEM CD) 240 MG 24 hr capsule Take 1 capsule (240 mg total) by mouth in the morning.  90 capsule 3   ELIQUIS 5 MG TABS tablet Take 1 tablet (5 mg total) by mouth 2 (two) times daily. 60 tablet 2   ezetimibe (ZETIA) 10 MG tablet Take 1 tablet (10 mg total) by mouth daily. (Patient taking differently: Take 10 mg by mouth 2 (two) times a week.) 90 tablet 3   fluticasone (FLONASE) 50 MCG/ACT nasal spray Place 2 sprays into both nostrils daily as needed for allergies.     hydrocortisone (CORTEF) 10 MG tablet Please take 10 mg in am and 5 mg in PM. Please allow for EMG dosing 160 tablet 3   levothyroxine (SYNTHROID) 150 MCG tablet TAKE 1 TABLET BY MOUTH EVERY OTHER DAY 30 tablet 3   levothyroxine (SYNTHROID) 175 MCG tablet TAKE 1 TABLET BY MOUTH EVERY OTHER DAY 30 tablet 3   loratadine (CLARITIN) 10 MG tablet Take 10 mg by mouth daily as needed for allergies.     morphine (MS CONTIN) 15 MG 12 hr tablet Take 1 tablet (15 mg total) by mouth every 12 (twelve) hours. 30 tablet 0   Multiple Vitamins-Minerals (CENTRUM SILVER 50+MEN) TABS Take 1 tablet by mouth daily with breakfast.     mupirocin ointment (BACTROBAN) 2 % Place 1 Application into the nose daily as needed (sores).     oxyCODONE-acetaminophen (PERCOCET/ROXICET) 5-325 MG tablet Take 1 tablet by mouth every 6 (six) hours  as needed for severe pain. 30 tablet 0   traZODone (DESYREL) 150 MG tablet Take 150 mg by mouth at bedtime as needed for sleep.     VITAMIN D PO Take 1 capsule by mouth daily.     No current facility-administered medications for this encounter.    REVIEW OF SYSTEMS:  Notable for that above.   PHYSICAL EXAM:  vitals were not taken for this visit.   General: Alert and oriented, in no acute distress HEENT: Head is normocephalic. Extraocular movements are intact. Oropharynx is notable for ***. Neck: Neck is notable for *** Heart: Regular in rate and rhythm with no murmurs, rubs, or gallops. Chest: Clear to auscultation bilaterally, with no rhonchi, wheezes, or rales. Abdomen: Soft, nontender, nondistended, with  no rigidity or guarding. Extremities: No cyanosis or edema. Lymphatics: see Neck Exam Skin: No concerning lesions. Musculoskeletal: symmetric strength and muscle tone throughout. Neurologic: Cranial nerves II through XII are grossly intact. No obvious focalities. Speech is fluent. Coordination is intact. Psychiatric: Judgment and insight are intact. Affect is appropriate.   ECOG = ***  0 - Asymptomatic (Fully active, able to carry on all predisease activities without restriction)  1 - Symptomatic but completely ambulatory (Restricted in physically strenuous activity but ambulatory and able to carry out work of a light or sedentary nature. For example, light housework, office work)  2 - Symptomatic, <50% in bed during the day (Ambulatory and capable of all self care but unable to carry out any work activities. Up and about more than 50% of waking hours)  3 - Symptomatic, >50% in bed, but not bedbound (Capable of only limited self-care, confined to bed or chair 50% or more of waking hours)  4 - Bedbound (Completely disabled. Cannot carry on any self-care. Totally confined to bed or chair)  5 - Death   Santiago Glad MM, Creech RH, Tormey DC, et al. [phone]). "Toxicity and response criteria of the [facility] Group". Am. Evlyn Clines. Oncol. 5 (6): 649-55   LABORATORY DATA:  Lab Results  Component Value Date   WBC 6.9 05/22/2022   HGB 8.9 (L) 05/22/2022   HCT 29.4 (L) 05/22/2022   MCV 95.8 05/22/2022   PLT 265 05/22/2022   CMP     Component Value Date/Time   NA 135 05/22/2022 0639   NA 141 08/11/2021 1252   K 4.4 05/22/2022 0639   CL 99 05/22/2022 0639   CO2 26 05/22/2022 0639   GLUCOSE 93 05/22/2022 0639   BUN 16 05/22/2022 0639   BUN 14 08/11/2021 1252   CREATININE 1.35 (H) 05/22/2022 0639   CREATININE 1.36 (H) 02/06/2022 0838   CALCIUM 8.6 (L) 05/22/2022 0639   PROT 6.3 (L) 05/22/2022 0639   PROT 6.9 03/11/2018 0913   ALBUMIN 2.8 (L) 05/22/2022 0639   ALBUMIN 4.5  03/11/2018 0913   AST 12 (L) 05/22/2022 0639   AST 20 02/06/2022 0838   ALT 15 05/22/2022 0639   ALT 20 02/06/2022 0838   ALKPHOS 61 05/22/2022 0639   BILITOT 0.6 05/22/2022 0639   BILITOT 0.3 02/06/2022 0838   GFRNONAA 53 (L) 05/22/2022 0639   GFRNONAA 53 (L) 02/06/2022 0838   GFRAA 50 (L) 08/03/2019 0954      Lab Results  Component Value Date   TSH 1.51 07/23/2021     RADIOGRAPHY: CT MAXILLOFACIAL W CONTRAST  Result Date: 05/19/2022 CLINICAL DATA:  Provided history: Maxillary sinus mass. EXAM: CT MAXILLOFACIAL WITH CONTRAST TECHNIQUE: Multidetector CT imaging of the maxillofacial  structures was performed with intravenous contrast. Multiplanar CT image reconstructions were also generated. RADIATION DOSE REDUCTION: This exam was performed according to the departmental dose-optimization program which includes automated exposure control, adjustment of the mA and/or kV according to patient size and/or use of iterative reconstruction technique. CONTRAST:  64mL OMNIPAQUE IOHEXOL 300 MG/ML  SOLN COMPARISON:  Maxillofacial CT 05/03/2022. FINDINGS: Osseous: Redemonstrated destructive process involving the posterolateral wall the left maxillary sinus with osseous thinning and erosion. Progressive extension of this process into the soft tissues of the left masticator space since the prior examination of 05/03/2022. The region of soft tissue involvement in the left masticator space now measures 4.3 x 3.0 cm in transaxial dimensions (previously 4.3 x 2.2 cm). Superimposed within the left masticator space, there is a 2.9 x 1.6 cm peripherally enhancing gas and fluid containing collection which is new from the prior study (for instance as seen on series 3, image 47). Nonspecific circumferential soft tissue within the adjacent left maxillary sinus measuring up to 1.4 cm in thickness, similar to the prior exam. Orbits: No orbital mass or acute orbital finding. Sinuses: Mild-to-moderate mucosal thickening within  the right maxillary sinus, slightly progressed. Circumferential soft tissue within the left maxillary sinus measuring up to 1.4 cm in thickness, similar to the prior exam. Redemonstrated tiny mucous retention cyst within the right sphenoid sinus. Mild mucosal thickening within the left sphenoid, bilateral ethmoid and bilateral frontal sinuses, similar to the prior study. Soft tissues: Aggressive process involving the posterolateral wall the left maxillary sinus and left masticator space as described above. Limited intracranial: No evidence of an acute intracranial abnormality within the field of view. Impression #1 will be called to the ordering clinician or representative by the Radiologist Assistant, and communication documented in the PACS or [employer]. IMPRESSION: 1. Redemonstrated destructive process involving the posterolateral wall the left maxillary sinus with osseous thinning and erosion. Progressive extension of this process into the soft tissues of the left masticator space, as described. This includes a 2.9 x 1.6 cm peripherally enhancing gas and fluid containing collection within the left masticator space, which is new from the prior examination of 05/03/2022. Primary differential considerations are unchanged and include an aggressive infection (resulting from fungal etiologies among others) with new masticator space abscess, or metastatic disease with both osseous involvement and necrotic soft tissue mass. 2. Paranasal sinus disease elsewhere, as above. Electronically Signed   By: Jackey Loge D.O.   On: 05/19/2022 12:09   CT CHEST WO CONTRAST  Result Date: 05/05/2022 CLINICAL DATA:  History of renal cell carcinoma with metastatic disease. Insert body on EXAM: CT CHEST WITHOUT CONTRAST TECHNIQUE: Multidetector CT imaging of the chest was performed following the standard protocol without IV contrast. RADIATION DOSE REDUCTION: This exam was performed according to the departmental  dose-optimization program which includes automated exposure control, adjustment of the mA and/or kV according to patient size and/or use of iterative reconstruction technique. COMPARISON:  February 06, 2022 FINDINGS: Cardiovascular: Calcified aortic atherosclerotic changes. Three-vessel coronary artery calcification. Normal caliber central pulmonary vasculature. Limited assessment of cardiovascular structures given lack of intravenous contrast. Mediastinum/Nodes: No adenopathy by size criteria in the chest. Scattered small lymph nodes in the posterior mediastinum (image 61/3) largest 8 mm short axis stable size as compared to previous imaging. Lungs/Pleura: Pleural and parenchymal scarring in the RIGHT chest following partial lung resection. No consolidation. No pleural effusion. Post RIGHT lower lobectomy. Upper Abdomen: Please see separate report for abdominal findings. Note that the interface between  the portal vein at the portal-splenic confluence and the pancreatic mass is better defined on these thinner section images. The possibility of early invasion of the portal vein is difficult to exclude on these noncontrast images. Musculoskeletal: No acute bone finding. No destructive bone process. Spinal degenerative changes. IMPRESSION: 1. Mild prominence of posterior mediastinal lymph nodes, unchanged. Suggest attention on follow-up in this patient with upper abdominal mass and potential retroperitoneal nodal involvement. 2. Mass-portal venous interfaces is indistinct, variable density is seen along the posterior margin of the portal vein on current images. Early invasion and or involvement of the portal vein is a possibility particularly in the setting of renal cell metastasis. Close attention on subsequent imaging is suggested. Ultrasound could potentially be of benefit for further evaluation or assessment with noncontrast MRI as warranted. 3. Three-vessel coronary artery calcification. 4. Aortic atherosclerosis.  Aortic Atherosclerosis (ICD10-I70.0). Electronically Signed   By: Donzetta Kohut M.D.   On: 05/05/2022 08:42   CT Abdomen Pelvis Wo Contrast  Result Date: 05/05/2022 CLINICAL DATA:  A 77 year old male with history of clear cell carcinoma of the RIGHT kidney presents for follow-up. * Tracking Code: BO * EXAM: CT ABDOMEN AND PELVIS WITHOUT CONTRAST TECHNIQUE: Multidetector CT imaging of the abdomen and pelvis was performed following the standard protocol without IV contrast. RADIATION DOSE REDUCTION: This exam was performed according to the departmental dose-optimization program which includes automated exposure control, adjustment of the mA and/or kV according to patient size and/or use of iterative reconstruction technique. COMPARISON:  February 06, 2022 FINDINGS: Lower chest: Basilar scarring on the RIGHT. Please see dedicated chest CT for further detail. Chest CT acquired concurrently and reported separately. Extensive coronary artery calcification incidentally noted. Postoperative changes about the RIGHT hilum similar to previous imaging. Hepatobiliary: Stable low-density hepatic lesion in the cephalad aspect of the medial segment of the LEFT hepatic lobe (15/3) 4.0 cm greatest axial dimension. Post cholecystectomy. Pancreas: Ill-defined mass in the area of the uncinate process of the pancreas not well delineated in the absence of contrast measuring approximately 4.9 x 5.0 cm, previously approximately 4.8 x 4.8 cm greatest axial dimension measured in a similar fashion and definitively larger than in August of 2023. Adjacent nodules or lymph nodes are more conspicuous/better defined the current study than on prior exam, for instance image 37/3 there is a separate 11 mm lymph node is measured in short axis dimension that new or enlarged since December (small lymph nodes present about the pancreatic head on previous imaging). On image 61/6 there is a 16 mm soft tissue nodule or lymph node that previously  measured 10 mm. No pancreatic ductal dilation or signs peripheral pancreatic inflammation. There is stranding about the pancreatic, this is been present since previous imaging. Spleen: Normal size and contour. Adrenals/Urinary Tract: RIGHT adrenal gland is normal as is the LEFT adrenal gland. Signs of fat necrosis in the nephrectomy bed without change Perinephric stranding on the LEFT. No change in renal contour. No signs of hydronephrosis. No ureteral dilation or perivesical stranding. Stomach/Bowel: Stomach without signs of adjacent stranding. Small bowel without signs of obstruction. Uncinate process mass inseparable from the 2nd-3rd portion of the duodenum. Colonic diverticulosis greatest in the sigmoid colon. Vascular/Lymphatic: Atherosclerotic changes of the abdominal aorta without aneurysm. Scattered retroperitoneal lymph nodes are similar to previous imaging. Lymph node adjacent to the pancreatic head as outlined above. Reproductive: Unremarkable by CT to the extent evaluated. Other: No ascites. Musculoskeletal: No acute bone finding. No destructive bone process. Spinal degenerative changes.  IMPRESSION: 1. Ill-defined mass in the area of the uncinate process of the pancreas with continued enlargement. 2. Enlarging adjacent soft tissue nodules or lymph nodes as outlined above. Potential new soft tissue nodule or lymph node since most recent imaging. 3. Stable mildly enlarged retroperitoneal lymph nodes along the LEFT para-aortic chain remains suspicious but are unchanged. 4. Stable low-density a hepatic lesion likely benign hemorrhagic or proteinaceous cyst. 5. Signs of fat necrosis in the nephrectomy bed without change. 6. Colonic diverticulosis greatest in the sigmoid colon. 7. Extensive coronary artery calcification incidentally noted. 8. Aortic atherosclerosis. 9. Chest CT acquired concurrently and reported separately. Aortic Atherosclerosis (ICD10-I70.0). Electronically Signed   By: Donzetta Kohut M.D.    On: 05/05/2022 08:32   CT MAXILLOFACIAL WO CONTRAST  Result Date: 05/05/2022 CLINICAL DATA:  Kidney cancer.  Staging.  Jaw pain. EXAM: CT MAXILLOFACIAL WITHOUT CONTRAST TECHNIQUE: Multidetector CT imaging of the maxillofacial structures was performed. Multiplanar CT image reconstructions were also generated. RADIATION DOSE REDUCTION: This exam was performed according to the departmental dose-optimization program which includes automated exposure control, adjustment of the mA and/or kV according to patient size and/or use of iterative reconstruction technique. COMPARISON:  01/28/2022 FINDINGS: Osseous: There is a destructive process involving the posterolateral wall of the left maxillary sinus with extension into the left masticator space. In the setting of metastatic carcinoma, this is presumed to be secondary to metastatic disease. Without history of cancer, aggressive infection such as fungal infection could also be considered. The process is progressive since December, at which time there were some early bony changes, but soft tissues of the face were not characterized based on the technical nature of the study. No other bony finding seen in the region. Orbits: Orbits are normal. Sinuses: Improvement in previously seen mucosal inflammatory changes of the paranasal sinuses. Mild mucosal thickening persists in the right maxillary sinus. Moderate mucosal thickening persists in the left maxillary sinus. Soft tissues: As noted above, a mass lesion involving the left masticator space and posterior wall of the left maxillary sinus. This is presumed represent metastatic disease from the described metastatic cancer. The differential diagnosis does include aggressive infection such as fungal disease. Limited intracranial: No intracranial abnormality is seen. IMPRESSION: 1. Destructive process involving the posterolateral wall of the left maxillary sinus with extension into the left masticator space. In the setting of  metastatic carcinoma, this is presumed to be secondary to metastatic disease. Without a history of cancer, aggressive infection such as fungal infection could also be considered. The process is progressive since December, at which time there were some early bony changes, but the soft tissues of the face were not characterized based on the technical nature of the study. 2. Improvement in previously seen mucosal inflammatory changes of the paranasal sinuses. Mild mucosal thickening persists in the right maxillary sinus. Moderate mucosal thickening persists in the left maxillary sinus. Electronically Signed   By: Paulina Fusi M.D.   On: 05/05/2022 08:16      IMPRESSION/PLAN:  This is a delightful patient with head and neck cancer. I *** recommend radiotherapy for this patient.  We discussed the potential risks, benefits, and side effects of radiotherapy. We talked in detail about acute and late effects. We discussed that some of the most bothersome acute effects may be mucositis, dysgeusia, salivary changes, skin irritation, hair loss, dehydration, weight loss and fatigue. We talked about late effects which include but are not necessarily limited to dysphagia, hypothyroidism, nerve injury, vascular injury, spinal cord injury,  xerostomia, trismus, neck edema, and potential injury to any of the tissues in the head and neck region. No guarantees of treatment were given. A consent form was signed and placed in the patient's medical record. The patient is enthusiastic about proceeding with treatment. I look forward to participating in the patient's care.    Simulation (treatment planning) will take place ***  We also discussed that the treatment of head and neck cancer is a multidisciplinary process to maximize treatment outcomes and quality of life. For this reason the following referrals have been or will be made:  *** Medical oncology to discuss chemotherapy   *** Dentistry for dental evaluation, possible  extractions in the radiation fields, and /or advice on reducing risk of cavities, osteoradionecrosis, or other oral issues.  *** Nutritionist for nutrition support during and after treatment.  *** Speech language pathology for swallowing and/or speech therapy.  *** Social work for social support.   *** Physical therapy due to risk of lymphedema in neck and deconditioning.  *** Baseline labs including TSH.  On date of service, in total, I spent *** minutes on this encounter. Patient was seen in person.  __________________________________________   Lonie Peak, MD  This document serves as a record of services personally performed by Lonie Peak, MD. It was created on her behalf by Neena Rhymes, a trained medical scribe. The creation of this record is based on the scribe's personal observations and the provider's statements to them. This document has been checked and approved by the attending provider.

## 2022-06-01 NOTE — Progress Notes (Signed)
Head and Neck Cancer Location of Tumor / Histology:  Primary malignant neoplasm of kidney with metastasis from kidney to other site, unspecified laterality -left maxillary sinus  05-19-22 CT MAXILLOFACIAL W CONTRAST  IMPRESSION: 1. Redemonstrated destructive process involving the posterolateral wall the left maxillary sinus with osseous thinning and erosion. Progressive extension of this process into the soft tissues of the left masticator space, as described. This includes a 2.9 x 1.6 cm peripherally enhancing gas and fluid containing collection within the left masticator space, which is new from the prior examination of 05/03/2022. Primary differential considerations are unchanged and include an aggressive infection (resulting from fungal etiologies among others) with new masticator space abscess, or metastatic disease with both osseous involvement and necrotic soft tissue mass. 2. Paranasal sinus disease elsewhere, as above.  05-03-22 CT MAXILLOFACIAL WO CONTRAST  IMPRESSION: 1. Destructive process involving the posterolateral wall of the left maxillary sinus with extension into the left masticator space. In the setting of metastatic carcinoma, this is presumed to be secondary to metastatic disease. Without a history of cancer, aggressive infection such as fungal infection could also be considered. The process is progressive since December, at which time there were some early bony changes, but the soft tissues of the face were not characterized based on the technical nature of the study. 2. Improvement in previously seen mucosal inflammatory changes of the paranasal sinuses. Mild mucosal thickening persists in the right maxillary sinus. Moderate mucosal thickening persists in the left maxillary sinus.  Patient presented with symptoms of: (per ENT note on 05-22-22) He continues to endorse ongoing symptoms of trismus and intraoral soreness. He does endorse some numbness over the left mid  face.   Biopsies revealed:  05-22-22 Clinical History: maxillary sinus mass, chronic maxillary sinusitis,  trismus, jaw pain (cm)   FINAL MICROSCOPIC DIAGNOSIS:   A. MASTECATOR SPACE MASS, EXCISION:  - ABUNDANT MUCUS WITH SCANT SINONASAL EPITHELIUM AND SCANT FRAGMENTS OF  BONE  - NEGATIVE FOR MALIGNANCY  - SEE COMMENT   B. SINUS CONTENTS, LEFT:  - BENIGN SINONASAL TISSUE WITH ACUTE AND CHRONIC SINUSITIS AND POLYPOID  FRAGMENTS WITH FEATURES SUGGESTIVE OF SINONASAL INFLAMMATORY POLYP(S)  - EOSINOPHILS PRESENT  - NEGATIVE FOR MALIGNANCY   COMMENT:  Part A: There are no features diagnostic of a mass present in the  material submitted.  Clinical and radiological correlation recommended.   Nutrition Status Yes No Comments  Weight changes?     Swallowing concerns?     PEG?      Referrals Yes No Comments  Social Work?     Dentistry?     Swallowing therapy?     Nutrition?     Med/Onc?      Safety Issues Yes No Comments  Prior radiation?     Pacemaker/ICD?     Possible current pregnancy?     Is the patient on methotrexate?      Tobacco/Marijuana/Snuff/ETOH use:  He has a history of smoking but quit 30 years ago.   Past/Anticipated interventions by otolaryngology, if any:  Dr. Irene Limbo on 05-22-22 Pre-Op Diagnosis: MAXILLARY SINUS MASS CHRONIC MAXILLARY SINUSITIS  TRISMUS JAW PAIN   Post-Op Diagnosis: MAXILLARY SINUS MASS CHRONIC MAXILLARY SINUSITIS  TRISMUS JAW PAIN   Procedure: Procedure(s): LEFT FUNCTIONAL ENDOSCOPIC SINUS SURGERY WITH MAXILLARY ANTROSTOMY WITH TISSUE REMOVAL AND IMAGE GUIDANCE  Owens Blanke is a 77 y.o. male who presents as a return consult, referred by Gerald Stabs,*, for evaluation and treatment of left maxillary sinus mass. Patient was last  seen in our office on 01/20/2022 for recurrent sinusitis and facial pain. At that time, nasal endoscopy was unremarkable, and dedicated CT sinus was  recommended for further evaluation due to normal nasal endoscopy. At that time, imaging reported widespread mucosal thickening throughout the paranasal sinuses, worse in the left maxillary sinus consistent with rhinosinusitis and nasal septal deviation. He was subsequently treated with oral antibiotics and steroids, and noted mild improvement in his symptoms.   Since that time, patient has been closely followed by cardiology, vascular surgery and oncology. Patient has history of stage IV renal cell carcinoma; disease is widely metastatic with involvement of the lung, liver, omentum, retrocaval lymph nodes s/p radiation therapy targeted to abdominal lymphadenopathy. He was treated with Opdivo-year Voora for 4 cycles but developed adrenal insufficiency and panhypopituitarism. He is currently on Cabometyx since March 2023. He had a biopsy of the temporal artery to rule out temporal arteritis which was unremarkable. He has also been following a TMJ specialist and has received injections without any significant improvement. He continues to endorse ongoing symptoms of trismus and intraoral soreness. He does endorse some numbness over the left mid face.   Assessment/Plan Ranier Slinker is a 77 y.o. male with history of stage IV renal cell carcinoma presenting for evaluation due to recent maxillofacial CT scan demonstrating persistent circumferential mucosal thickening in the left maxillary sinus with bony erosion of the posterior lateral wall of the maxillary sinus. Nasal endoscopy performed 03/27 demonstrated findings consistent with probable metastatic disease with superimposed infection.  -To OR for left maxillary antrostomy and biopsy under general anesthesia in the operating room.   Past/Anticipated interventions by medical oncology, if any:  Cassie Heilingoetter PA-C  I called the patients daughter. Dr. Arbutus Ped talked to Dr. Marene Lenz, and he placed an urgent referral for him to see radiation oncology for  consideration of palliative radiotherapy. Dr. Marene Lenz still believes that there is cancer there based on her intervention and imaging studies. I called the patient's daughter to let her know to be on the look out for a call from radiation oncology and what this is for. She expressed understanding       Dr. Arbutus Ped on 04-20-22 [name] 77 y.o. male came to the clinic today accompanied by his daughter [name] to establish care with me after his primary oncologist Dr. Clelia Croft left the practice.  The patient has been complaining of mouth sores as well as inability to open his mouth fully.  He was seen by his dentist and has a sinus scan that was unremarkable.  He is also scheduled for repeat scan of the jaw soon.  He has been complaining of this problem for the last 5-6 months.  He has a biopsy of the temporal artery to rule out temporal arteritis and this was unremarkable.  He is currently on treatment with prednisone.  He did hold his treatment with Cabometyx for the last 6 days.  He did not notice any difference.  He has no current nausea, vomiting, diarrhea or constipation.  He has no headache or visual changes.  He has no fever or chills.  He has no chest pain, shortness of breath, cough or hemoptysis. He is a widow and has 2 daughters.  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  He has a history of smoking but quit 30 years ago.  He has no history of alcohol or drug abuse.  ASSESSMENT AND PLAN: This is a very pleasant 77 years old white  male with Stage IV clear-cell renal cell carcinoma diagnosed initially in 2016 as localized T1 a disease with evidence of metastatic disease to the liver and omentum in 2019. The patient underwent the following treatment: 1) status post radical right renal nephrectomy under the care of Dr. Marlou Porch on February 01, 2015 and the final pathology revealed 3.3 cm clear renal cell carcinoma with Fuhrman grade 3. 2) status post omental biopsy in July  2019 that confirmed the presence of recurrent metastatic renal cell carcinoma. 3) status post treatment with immunotherapy with ipilimumab 1 Mg/KG and nivolumab 3 mg/KG every 3 weeks started 09/21/2017 status post 4 cycles and the patient did not receive any additional immunotherapy secondary to adrenal insufficiency and panhypopituitarism 4) status post radiotherapy to the abdominal lymph node completed November 08, 2019. He is currently on treatment with Cabometyx 20 mg p.o. daily started March 2023.  His treatment has been on hold for the last 7 days because of concern about toxicity from the mouth sores and jaw tenderness. I recommended for the patient to hold his Cabometyx for a few more days and if there is no improvement in his condition to resume it at the same dose. I will see him back for follow-up visit in 2 weeks for evaluation with repeat CT scan of the chest, abdomen and pelvis for restaging of his disease. The patient was advised to call immediately if he has any other concerning symptoms in the interval. The patient voices understanding of current disease status and treatment options and is in agreement with the current care plan.     Current Complaints / other details:  ***

## 2022-06-02 ENCOUNTER — Ambulatory Visit: Payer: Medicare HMO

## 2022-06-02 ENCOUNTER — Other Ambulatory Visit: Payer: Self-pay

## 2022-06-02 ENCOUNTER — Ambulatory Visit
Admission: RE | Admit: 2022-06-02 | Discharge: 2022-06-02 | Disposition: A | Discharge status: Home / Self Care | Payer: Medicare HMO | Source: Ambulatory Visit | Attending: Radiation Oncology | Admitting: Radiation Oncology

## 2022-06-02 ENCOUNTER — Other Ambulatory Visit: Payer: Self-pay | Admitting: Physician Assistant

## 2022-06-02 ENCOUNTER — Ambulatory Visit: Payer: Medicare HMO | Admitting: Radiation Oncology

## 2022-06-02 VITALS — BP 162/73 | HR 70 | Temp 98.4°F | Resp 18 | Wt 166.4 lb

## 2022-06-02 DIAGNOSIS — Z85528 Personal history of other malignant neoplasm of kidney: Secondary | ICD-10-CM | POA: Insufficient documentation

## 2022-06-02 DIAGNOSIS — R519 Headache, unspecified: Secondary | ICD-10-CM | POA: Insufficient documentation

## 2022-06-02 DIAGNOSIS — K5730 Diverticulosis of large intestine without perforation or abscess without bleeding: Secondary | ICD-10-CM | POA: Insufficient documentation

## 2022-06-02 DIAGNOSIS — Z9049 Acquired absence of other specified parts of digestive tract: Secondary | ICD-10-CM | POA: Insufficient documentation

## 2022-06-02 DIAGNOSIS — I700 Atherosclerosis of aorta: Secondary | ICD-10-CM | POA: Diagnosis not present

## 2022-06-02 DIAGNOSIS — I2510 Atherosclerotic heart disease of native coronary artery without angina pectoris: Secondary | ICD-10-CM | POA: Insufficient documentation

## 2022-06-02 DIAGNOSIS — Z7901 Long term (current) use of anticoagulants: Secondary | ICD-10-CM | POA: Diagnosis not present

## 2022-06-02 DIAGNOSIS — C786 Secondary malignant neoplasm of retroperitoneum and peritoneum: Secondary | ICD-10-CM | POA: Insufficient documentation

## 2022-06-02 DIAGNOSIS — Z87891 Personal history of nicotine dependence: Secondary | ICD-10-CM | POA: Insufficient documentation

## 2022-06-02 DIAGNOSIS — C772 Secondary and unspecified malignant neoplasm of intra-abdominal lymph nodes: Secondary | ICD-10-CM | POA: Insufficient documentation

## 2022-06-02 DIAGNOSIS — Z7952 Long term (current) use of systemic steroids: Secondary | ICD-10-CM | POA: Insufficient documentation

## 2022-06-02 DIAGNOSIS — C310 Malignant neoplasm of maxillary sinus: Secondary | ICD-10-CM | POA: Diagnosis not present

## 2022-06-02 DIAGNOSIS — R6884 Jaw pain: Secondary | ICD-10-CM | POA: Diagnosis not present

## 2022-06-02 DIAGNOSIS — G893 Neoplasm related pain (acute) (chronic): Secondary | ICD-10-CM

## 2022-06-02 MED ORDER — OXYCODONE-ACETAMINOPHEN 5-325 MG PO TABS
1.0000 | ORAL_TABLET | Freq: Four times a day (QID) | ORAL | 0 refills | Status: DC | PRN
Start: 2022-06-02 — End: 2022-06-04

## 2022-06-02 NOTE — Patient Instructions (Signed)
The fungal medication you were recently prescribed by Dr. Marene Lenz can increase the sedative effects of your pain medication (oxycodone-acetaminophen). Please call Dr. Arbutus Ped or Cassie's nurse if you are noticing an increase in sleepiness/sedation while taking both of these medications

## 2022-06-02 NOTE — Progress Notes (Signed)
Oncology Nurse Navigator Documentation   Met with patient before his initial consult with Dr. Basilio Cairo.  Further introduced myself as his/their Navigator, explained my role as a member of the Care Team..   They verbalized understanding of information provided. I encouraged them to call with questions/concerns moving forward.  Hedda Slade, RN, BSN, OCN Head & Neck Oncology Nurse Navigator [facility] [place] [phone]

## 2022-06-03 ENCOUNTER — Inpatient Hospital Stay: Payer: Medicare HMO | Attending: Oncology | Admitting: Nurse Practitioner

## 2022-06-03 VITALS — BP 154/60 | HR 75 | Temp 98.9°F | Resp 16

## 2022-06-03 DIAGNOSIS — Z7189 Other specified counseling: Secondary | ICD-10-CM

## 2022-06-03 DIAGNOSIS — C786 Secondary malignant neoplasm of retroperitoneum and peritoneum: Secondary | ICD-10-CM | POA: Insufficient documentation

## 2022-06-03 DIAGNOSIS — C787 Secondary malignant neoplasm of liver and intrahepatic bile duct: Secondary | ICD-10-CM | POA: Insufficient documentation

## 2022-06-03 DIAGNOSIS — C649 Malignant neoplasm of unspecified kidney, except renal pelvis: Secondary | ICD-10-CM

## 2022-06-03 DIAGNOSIS — R530 Neoplastic (malignant) related fatigue: Secondary | ICD-10-CM | POA: Diagnosis not present

## 2022-06-03 DIAGNOSIS — G893 Neoplasm related pain (acute) (chronic): Secondary | ICD-10-CM | POA: Diagnosis not present

## 2022-06-03 DIAGNOSIS — R5383 Other fatigue: Secondary | ICD-10-CM | POA: Insufficient documentation

## 2022-06-03 DIAGNOSIS — Z515 Encounter for palliative care: Secondary | ICD-10-CM

## 2022-06-03 DIAGNOSIS — C641 Malignant neoplasm of right kidney, except renal pelvis: Secondary | ICD-10-CM | POA: Insufficient documentation

## 2022-06-03 DIAGNOSIS — E860 Dehydration: Secondary | ICD-10-CM | POA: Insufficient documentation

## 2022-06-03 NOTE — Patient Instructions (Addendum)
-   continue your long acting morphine (MS Contin) and take it every 12 hours regardless of pain medications, you can take your percocet as soon as 30 min after  - continue your percocet as needed for pain every 6 hours - give [place] a call 2-3 days before you're out of medications so we can refill it for you - if you find that you are becoming constipated pick up over the counter Mirilax every day to help - we will call you early next week, call us before then if you have any questions or concerns

## 2022-06-04 ENCOUNTER — Encounter: Payer: Self-pay | Admitting: Internal Medicine

## 2022-06-04 MED ORDER — OXYCODONE-ACETAMINOPHEN 5-325 MG PO TABS
1.0000 | ORAL_TABLET | Freq: Four times a day (QID) | ORAL | 0 refills | Status: DC | PRN
Start: 2022-06-04 — End: 2022-08-06

## 2022-06-05 ENCOUNTER — Other Ambulatory Visit (HOSPITAL_COMMUNITY): Payer: Self-pay

## 2022-06-08 ENCOUNTER — Encounter: Payer: Self-pay | Admitting: Nurse Practitioner

## 2022-06-08 ENCOUNTER — Inpatient Hospital Stay (HOSPITAL_BASED_OUTPATIENT_CLINIC_OR_DEPARTMENT_OTHER): Payer: Medicare HMO | Admitting: Nurse Practitioner

## 2022-06-08 ENCOUNTER — Other Ambulatory Visit: Payer: Self-pay

## 2022-06-08 ENCOUNTER — Inpatient Hospital Stay: Payer: Medicare HMO

## 2022-06-08 DIAGNOSIS — C787 Secondary malignant neoplasm of liver and intrahepatic bile duct: Secondary | ICD-10-CM | POA: Diagnosis not present

## 2022-06-08 DIAGNOSIS — Z515 Encounter for palliative care: Secondary | ICD-10-CM

## 2022-06-08 DIAGNOSIS — G893 Neoplasm related pain (acute) (chronic): Secondary | ICD-10-CM | POA: Diagnosis not present

## 2022-06-08 DIAGNOSIS — E860 Dehydration: Secondary | ICD-10-CM

## 2022-06-08 DIAGNOSIS — R530 Neoplastic (malignant) related fatigue: Secondary | ICD-10-CM

## 2022-06-08 DIAGNOSIS — C649 Malignant neoplasm of unspecified kidney, except renal pelvis: Secondary | ICD-10-CM

## 2022-06-08 DIAGNOSIS — R5383 Other fatigue: Secondary | ICD-10-CM | POA: Diagnosis not present

## 2022-06-08 DIAGNOSIS — C641 Malignant neoplasm of right kidney, except renal pelvis: Secondary | ICD-10-CM | POA: Diagnosis not present

## 2022-06-08 DIAGNOSIS — C786 Secondary malignant neoplasm of retroperitoneum and peritoneum: Secondary | ICD-10-CM | POA: Diagnosis not present

## 2022-06-08 LAB — CBC WITH DIFFERENTIAL (CANCER CENTER ONLY)
Abs Immature Granulocytes: 0.07 10*3/uL (ref 0.00–0.07)
Basophils Absolute: 0 10*3/uL (ref 0.0–0.1)
Basophils Relative: 0 %
Eosinophils Absolute: 0 10*3/uL (ref 0.0–0.5)
Eosinophils Relative: 0 %
HCT: 37.1 % — ABNORMAL LOW (ref 39.0–52.0)
Hemoglobin: 11.3 g/dL — ABNORMAL LOW (ref 13.0–17.0)
Immature Granulocytes: 0 %
Lymphocytes Relative: 7 %
Lymphs Abs: 1.1 10*3/uL (ref 0.7–4.0)
MCH: 27.9 pg (ref 26.0–34.0)
MCHC: 30.5 g/dL (ref 30.0–36.0)
MCV: 91.6 fL (ref 80.0–100.0)
Monocytes Absolute: 0.6 10*3/uL (ref 0.1–1.0)
Monocytes Relative: 4 %
Neutro Abs: 14.2 10*3/uL — ABNORMAL HIGH (ref 1.7–7.7)
Neutrophils Relative %: 89 %
Platelet Count: 445 10*3/uL — ABNORMAL HIGH (ref 150–400)
RBC: 4.05 MIL/uL — ABNORMAL LOW (ref 4.22–5.81)
RDW: 16.3 % — ABNORMAL HIGH (ref 11.5–15.5)
WBC Count: 16 10*3/uL — ABNORMAL HIGH (ref 4.0–10.5)
nRBC: 0 % (ref 0.0–0.2)

## 2022-06-08 LAB — CMP (CANCER CENTER ONLY)
ALT: 14 U/L (ref 0–44)
AST: 13 U/L — ABNORMAL LOW (ref 15–41)
Albumin: 3.7 g/dL (ref 3.5–5.0)
Alkaline Phosphatase: 83 U/L (ref 38–126)
Anion gap: 10 (ref 5–15)
BUN: 31 mg/dL — ABNORMAL HIGH (ref 8–23)
CO2: 30 mmol/L (ref 22–32)
Calcium: 9.8 mg/dL (ref 8.9–10.3)
Chloride: 98 mmol/L (ref 98–111)
Creatinine: 1.35 mg/dL — ABNORMAL HIGH (ref 0.61–1.24)
GFR, Estimated: 53 mL/min — ABNORMAL LOW (ref >60)
Glucose, Bld: 110 mg/dL — ABNORMAL HIGH (ref 70–99)
Potassium: 4.9 mmol/L (ref 3.5–5.1)
Sodium: 138 mmol/L (ref 135–145)
Total Bilirubin: 0.4 mg/dL (ref 0.3–1.2)
Total Protein: 7.5 g/dL (ref 6.5–8.1)

## 2022-06-08 MED ORDER — SODIUM CHLORIDE 0.9 % IV SOLN: Freq: Once | INTRAVENOUS | Status: AC

## 2022-06-08 MED ORDER — SODIUM CHLORIDE 0.9 % IV SOLN: Freq: Once | INTRAVENOUS | Status: DC

## 2022-06-08 NOTE — Progress Notes (Signed)
Pt daughter jennifer called reporting pt is weak and fatigued and is not eating or drinking very much since Saturday. Pt wises to come in for IVF, appt made an confirmed with pt/daughter, no further needs at this time.

## 2022-06-08 NOTE — Patient Instructions (Signed)

## 2022-06-08 NOTE — Progress Notes (Signed)
Palliative Medicine [facility]  [phone] [phone]) [phone]   Name: [name] Date: 06/08/2022 MRN: [id number]  DOB: 1945/10/27  Patient Care Team: [name], MD as PCP - General (Family Medicine) Thurmon Fair, MD as PCP - Cardiology (Cardiology) Karie Soda, MD as Consulting Physician (General Surgery) Crist Fat, MD as Consulting Physician (Urology)    INTERVAL HISTORY: [name] is a 77 y.o. 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 Palliative ask to see for symptom and pain management and goals of care.   SOCIAL HISTORY:     reports that he quit smoking about 34 years ago. His smoking use included cigarettes. He has a 20.00 pack-year smoking history. He has never used smokeless tobacco. He reports that he does not currently use alcohol. He reports that he does not use drugs.  ADVANCE DIRECTIVES:  None on file CODE STATUS: Full code  PAST MEDICAL HISTORY: Past Medical History:  Diagnosis Date   Arthritis    Chronic kidney disease    only has one kidney    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 02/01/2015   Coronary artery disease    followed by Dr.Tilley   Frequent PVCs    GERD (gastroesophageal reflux disease)    Heart murmur    never has caused any problems   Hx of cancer of lung 1980's   Hyperlipidemia    Hypertension    Hypothyroidism    Incisional hernia 08/01/2015   Lung cancer 1993   Lung metastases 2019   Pneumonia    x several   Recurrent umbilical hernia 08/01/2015    ALLERGIES:  is allergic to diphenhydramine.  MEDICATIONS:  Current Outpatient Medications  Medication Sig Dispense Refill   acetaminophen (TYLENOL) 500 MG tablet Take 1,000 mg by mouth every 6 (six) hours as needed for moderate pain.     cabozantinib  (CABOMETYX) 20 MG tablet Take 1 tablet (20 mg total) by mouth daily. Take on an empty stomach, 1 hour before or 2 hours after meals. 30 tablet 1   diltiazem (CARDIZEM CD) 240 MG 24 hr capsule Take 1 capsule (240 mg total) by mouth in the morning. 90 capsule 3   ELIQUIS 5 MG TABS tablet Take 1 tablet (5 mg total) by mouth 2 (two) times daily. 60 tablet 2   ezetimibe (ZETIA) 10 MG tablet Take 1 tablet (10 mg total) by mouth daily. (Patient taking differently: Take 10 mg by mouth 2 (two) times a week.) 90 tablet 3   fluticasone (FLONASE) 50 MCG/ACT nasal spray Place 2 sprays into both nostrils daily as needed for allergies.     hydrocortisone (CORTEF) 10 MG tablet Please take 10 mg in am and 5 mg in PM. Please allow for EMG dosing 160 tablet 3   levothyroxine (SYNTHROID) 150 MCG tablet TAKE 1 TABLET BY MOUTH EVERY OTHER DAY 30 tablet 3   levothyroxine (SYNTHROID) 175 MCG tablet TAKE 1 TABLET BY MOUTH EVERY OTHER DAY 30 tablet 3   loratadine (CLARITIN) 10 MG tablet Take 10 mg by mouth daily as needed for allergies.     morphine (MS CONTIN) 15 MG 12 hr tablet Take 1 tablet (15 mg total) by mouth every 12 (twelve) hours. 30 tablet 0   Multiple Vitamins-Minerals (CENTRUM SILVER 50+MEN) TABS Take 1 tablet by mouth daily with breakfast.  mupirocin ointment (BACTROBAN) 2 % Place 1 Application into the nose daily as needed (sores).     oxyCODONE-acetaminophen (PERCOCET/ROXICET) 5-325 MG tablet Take 1 tablet by mouth every 6 (six) hours as needed for severe pain. 60 tablet 0   predniSONE (DELTASONE) 10 MG tablet Take 10 mg by mouth See admin instructions. Take 4 tablets (40 mg total) by mouth daily for 3 days, THEN 3 tablets (30 mg total) daily for 3 days, THEN 2 tablets (20 mg total) daily for 3 days, THEN 1 tablet (10 mg total) daily for 3 days.     traZODone (DESYREL) 150 MG tablet Take 150 mg by mouth at bedtime as needed for sleep.     VITAMIN D PO Take 1 capsule by mouth daily.     voriconazole (VFEND)  200 MG tablet Take 200 mg by mouth 2 (two) times daily.     No current facility-administered medications for this visit.    VITAL SIGNS: There were no vitals taken for this visit. There were no vitals filed for this visit.  Estimated body mass index is 25.41 kg/m as calculated from the following:   Height as of 05/22/22:  (1.727 m).   Weight as of an earlier encounter on 06/08/22: 167 lb 1.6 oz (75.8 kg).     Latest Ref Rng & Units 06/08/2022    1:48 PM 05/22/2022    6:39 AM 04/20/2022   12:56 PM  CBC  WBC 4.0 - 10.5 K/uL 16.0  6.9  8.8   Hemoglobin 13.0 - 17.0 g/dL 40.9  8.9  8.9   Hematocrit 39.0 - 52.0 % 37.1  29.4  28.8   Platelets 150 - 400 K/uL 445  265  256        Latest Ref Rng & Units 06/08/2022    1:48 PM 05/22/2022    6:39 AM 04/20/2022   12:56 PM  CMP  Glucose 70 - 99 mg/dL 811  93  914   BUN 8 - 23 mg/dL Creatinine 0.61 - 1.24 mg/dL 7.82  9.56  2.13   Sodium 135 - 145 mmol/L 138  135  138   Potassium 3.5 - 5.1 mmol/L 4.9  4.4  4.5   Chloride 98 - 111 mmol/L 98  99  103   CO2 22 - 32 mmol/L Calcium 8.9 - 10.3 mg/dL 9.8  8.6  8.3   Total Protein 6.5 - 8.1 g/dL 7.5  6.3  5.7   Total Bilirubin 0.3 - 1.2 mg/dL 0.4  0.6  0.2   Alkaline Phos 38 - 126 U/L 83  61  50   AST 15 - 41 U/L ALT 0 - 44 U/L PERFORMANCE STATUS (ECOG) : 1 - Symptomatic but completely ambulatory   Physical Exam General: NAD, weak appearing Cardiovascular: regular rate and rhythm Pulmonary: Normal breathing pattern Abdomen: soft, nontender, + bowel sounds Extremities: no edema, no joint deformities Skin: no rashes Neurological: AAOx4  IMPRESSION: [name] presents to clinic today for symptom management follow-up. Daughter is present. Denies nausea, vomiting, constipation, or diarrhea. Denies difficulty urinating. Patient however has not had much oral intake over the past 2-3 days. Daughter called concerned and he was brought in to be  evaluated.  Receiving IV fluids during visit.  I discussed at length with patient and daughter lab findings.  Mr. Carlile most recently restarted his MS Contin every 12 hours.  Within 48 hours after starting patient became more somnolent and daughter reports some confusion.  Has not had much energy to do anything.  Given only recent changes was to start of the MS Contin have recommended patient discontinue.  Daughter verbalized understanding.  We discussed his regimen at length.  He will continue to take oxycodone as needed every 6 hours for moderate to severe pain.  Daughter reports recent changes in his prednisone.  Last day for his taper dose is today.  Identified this may likely be causing increase in WBCs/platelets.  Will continue to closely monitor.  Patient has follow-up appointment on Thursday.  Daughter is aware if patient worsens or shows no improvement to contact our office immediately.  Otherwise we will plan to follow-up with him on Thursday.  We will schedule potential IV fluids in case needed.  They are aware we can cancel this if patient is doing better.  PLAN: Discontinue MS Contin due to increased somnolence, elevated kidney function. Continue with oxycodone 5/325 every 6 hours as needed for moderate to severe pain IV fluids while in clinic over 2 hours Ongoing symptom management and goals of care support. I will plan to see patient back on Thursday for close follow-up.   Patient expressed understanding and was in agreement with this plan. He also understands that He can call the clinic at any time with any questions, concerns, or complaints.   Any controlled substances utilized were prescribed in the context of palliative care. PDMP has been reviewed.    Visit consisted of counseling and education dealing with the complex and emotionally intense issues of symptom management and palliative care in the setting of serious and potentially life-threatening illness.Greater than 50%   of this time was spent counseling and coordinating care related to the above assessment and plan.  Willette Alma, AGPCNP-BC  Palliative Medicine Team/[facility]  *Please note that this is a verbal dictation therefore any spelling or grammatical errors are due to the "[employer]agon [employer] One" system interpretation.

## 2022-06-09 ENCOUNTER — Telehealth: Payer: Self-pay

## 2022-06-09 ENCOUNTER — Other Ambulatory Visit: Payer: Self-pay | Admitting: Internal Medicine

## 2022-06-09 ENCOUNTER — Inpatient Hospital Stay: Payer: Medicare HMO | Admitting: Nurse Practitioner

## 2022-06-09 DIAGNOSIS — E039 Hypothyroidism, unspecified: Secondary | ICD-10-CM

## 2022-06-09 NOTE — Telephone Encounter (Signed)
Advised to stop by for labs before steroids.

## 2022-06-09 NOTE — Telephone Encounter (Signed)
T, He did not have his thyroid checked at last blood draw.  I put in the tests and he can come by the office to have them checked.  Please remember to come in the morning and not take the steroid until after lab draw.

## 2022-06-09 NOTE — Telephone Encounter (Signed)
Pt's daughter called her dad has not been acting well. Loss of appetite and fatigued. Says very similar to when he was first diagnosed with hypothyroidism. He had lab work done yesterday but daughter wanted to see if his levels are off.

## 2022-06-10 ENCOUNTER — Ambulatory Visit: Payer: Medicare HMO | Admitting: Internal Medicine

## 2022-06-10 ENCOUNTER — Other Ambulatory Visit (INDEPENDENT_AMBULATORY_CARE_PROVIDER_SITE_OTHER): Payer: Medicare HMO

## 2022-06-10 ENCOUNTER — Encounter: Payer: Self-pay | Admitting: Internal Medicine

## 2022-06-10 ENCOUNTER — Other Ambulatory Visit: Payer: Self-pay

## 2022-06-10 VITALS — BP 134/73 | HR 92 | Temp 98.2°F | Resp 16 | Wt 168.0 lb

## 2022-06-10 DIAGNOSIS — E039 Hypothyroidism, unspecified: Secondary | ICD-10-CM

## 2022-06-10 DIAGNOSIS — J329 Chronic sinusitis, unspecified: Secondary | ICD-10-CM | POA: Diagnosis not present

## 2022-06-10 DIAGNOSIS — B4489 Other forms of aspergillosis: Secondary | ICD-10-CM | POA: Insufficient documentation

## 2022-06-10 DIAGNOSIS — B49 Unspecified mycosis: Secondary | ICD-10-CM | POA: Diagnosis not present

## 2022-06-10 LAB — TSH: TSH: 1.15 u[IU]/mL (ref 0.35–5.50)

## 2022-06-10 LAB — T4, FREE: Free T4: 1.21 ng/dL (ref 0.60–1.60)

## 2022-06-10 MED ORDER — VORICONAZOLE 200 MG PO TABS: 200.0000 mg | ORAL_TABLET | Freq: Two times a day (BID) | ORAL | 0 refills | Status: DC

## 2022-06-10 NOTE — Assessment & Plan Note (Signed)
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  There was no obvious evidence of malignancy noted on recent biopsy earlier this month and cultures grew Aspergillus.  At this time, radiation oncology is recommending medical management with antifungals and I think would be good to get repeat imaging in about 6 weeks to see what improvement has been noted.  I wonder if the 4-6 weeks of prednisone in January-February contributed to this progressive fungal infection over the past few months.  Unfortunately, he has not begun taking the voriconazole  BID that was prescribed 10 days ago.  Discussed with his daughter the importance of consistently taking this medication and need for a trough level in about 5-7 days after consistently taking so we know if he is at a therapeutic level.  RTC 1 week for trough level and RTC 6 weeks for office visit to discuss repeat imaging if not already done. I went ahead and refilled his Voriconazole today so he has enough on hand for the next several weeks.

## 2022-06-10 NOTE — Progress Notes (Signed)
[facility]  Reason for Consult: Fungal sinusitis  Referring Provider: ENT   HPI:    [name] is a 77 y.o. male with PMHx as below who presents to the clinic for fungal sinusitis.   Patient has prior oncologic history of clear-cell renal cell carcinoma (01/2015) with metastatic disease to the liver (08/2017) and recent maxillary sinusitis.  There was concern that his sinus disease represented malignancy and he has been following with ENT.  Patient presented to Dr Marene Lenz in Dec 2023 for recurrent sinusitis and facial pain. At that time, nasal endoscopy was unremarkable, and dedicated CT sinus was recommended for further evaluation. Imaging demonstrated widespread mucosal thickening throughout the paranasal sinuses, worse in the left maxillary sinus consistent with rhinosinusitis and nasal septal deviation.   He was subsequently treated with oral antibiotics and steroids. He noted mild improvement in his symptoms. However, he continued to have symptoms as the months progressed.  In January, there was concern for temporal arteritis and he was precribed prednisone  daily.  He was referred to vascular surgery on 03/31/22 for further evaluation of his jaw pain. Dr. Chestine Spore noted his symptoms to be consistent with temporal arteritis and subsequently recommended a left temporal artery biopsy to rule this out. Biopsy of the left temporal artery on 04/10/22 showed a segment of a small caliber artery with no specific histopathologic changes. Path was also negative for chronic inflammation, giant cells, granulomas or fragmentation of elastic lamina. Ultimately, he had been on prednisone for about 4-5 weeks.   Given continued symptoms, repeat imaging on 05/03/22 showed a destructive process involving the posterolateral wall of the left maxillary sinus with extension into the left masticator space. In the setting of metastatic carcinoma, findings were presumed to be  secondary to metastatic disease versus a fungal infection. CT also otherwise showed improvement in the previously seen mucosal inflammatory changes of the paranasal sinuses. The  patient underwent biopsies of the left sinus along with excision of the masticator space mass on 05/22/22 under Dr. Marene Lenz. Pathology showed benign findings without evidence of malignancy in either specimen, with some features suggestive of sinonasal inflammatory polyp(s) in the left sinus. Patient was referred to radiation oncology to consider palliative radiation given the concern for malignancy despite the negative biopsy.  The tumor board met to discuss his case and decided there is not clearly a sign of obvious malignancy and recommend management per ID initially as his cultures also grew mold which was further identified as Aspergillus fumigatus.  Tissue also grew rare P acnes which is likely not signficant give the overall picture.  He was started the antifungals (voriconazole  BID) as of approximately 06/01/22 which were prescribed by Dr Marene Lenz. She plans to rescan in about 6 weeks to follow up on interval improvement.  Since his surgery on 05/22/22 patient began experiencing worsening pain symptoms and is also following with palliative care.   Today, he is here with his daughter.  He continues to have a lot of pain and discomfort.  He has trouble opening his jaw due to pain.  She reports that he likely has not started the voriconazole that was prescribed.  She is not 100% sure but fairly confident that he has not taken it and certainly does not know when his last dose was.  Patient's Medications  New Prescriptions   No medications on file  Previous Medications   ACETAMINOPHEN (TYLENOL) 500 MG TABLET    Take 1,000 mg  by mouth every 6 (six) hours as needed for moderate pain.   CABOZANTINIB (CABOMETYX) 20 MG TABLET    Take 1 tablet (20 mg total) by mouth daily. Take on an empty stomach, 1 hour before or 2 hours after  meals.   DILTIAZEM (CARDIZEM CD) 240 MG 24 HR CAPSULE    Take 1 capsule (240 mg total) by mouth in the morning.   ELIQUIS 5 MG TABS TABLET    Take 1 tablet (5 mg total) by mouth 2 (two) times daily.   EZETIMIBE (ZETIA) 10 MG TABLET    Take 1 tablet (10 mg total) by mouth daily.   FLUTICASONE (FLONASE) 50 MCG/ACT NASAL SPRAY    Place 2 sprays into both nostrils daily as needed for allergies.   HYDROCORTISONE (CORTEF) 10 MG TABLET    Please take 10 mg in am and 5 mg in PM. Please allow for EMG dosing   LEVOTHYROXINE (SYNTHROID) 150 MCG TABLET    TAKE 1 TABLET BY MOUTH EVERY OTHER DAY   LEVOTHYROXINE (SYNTHROID) 175 MCG TABLET    TAKE 1 TABLET BY MOUTH EVERY OTHER DAY   LORATADINE (CLARITIN) 10 MG TABLET    Take 10 mg by mouth daily as needed for allergies.   MORPHINE (MS CONTIN) 15 MG 12 HR TABLET    Take 1 tablet (15 mg total) by mouth every 12 (twelve) hours.   MULTIPLE VITAMINS-MINERALS (CENTRUM SILVER 50+MEN) TABS    Take 1 tablet by mouth daily with breakfast.   MUPIROCIN OINTMENT (BACTROBAN) 2 %    Place 1 Application into the nose daily as needed (sores).   OXYCODONE-ACETAMINOPHEN (PERCOCET/ROXICET) 5-325 MG TABLET    Take 1 tablet by mouth every 6 (six) hours as needed for severe pain.   TRAZODONE (DESYREL) 150 MG TABLET    Take 150 mg by mouth at bedtime as needed for sleep.   VITAMIN D PO    Take 1 capsule by mouth daily.  Modified Medications   Modified Medication Previous Medication   VORICONAZOLE (VFEND) 200 MG TABLET voriconazole (VFEND) 200 MG tablet      Take 1 tablet (200 mg total) by mouth 2 (two) times daily.    Take 200 mg by mouth 2 (two) times daily.  Discontinued Medications   No medications on file      Past Medical History:  Diagnosis Date   Arthritis    Chronic kidney disease    only has one kidney    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 02/01/2015   Coronary artery disease    followed by Dr.Tilley   Frequent PVCs    GERD  (gastroesophageal reflux disease)    Heart murmur    never has caused any problems   Hx of cancer of lung 1980's   Hyperlipidemia    Hypertension    Hypothyroidism    Incisional hernia 08/01/2015   Lung cancer 1993   Lung metastases 2019   Pneumonia    x several   Recurrent umbilical hernia 08/01/2015    Social History   Tobacco Use   Smoking status: Former    Packs/day: 1.00    Years: 20.00    Additional pack years: 0.00    Total pack years: 20.00    Types: Cigarettes    Quit date: 01/29/1988    Years since quitting: 34.3   Smokeless tobacco: Never  Vaping Use   Vaping Use: Never used  Substance Use Topics   Alcohol use: Not  Currently    Comment: rare   Drug use: No    Family History  Problem Relation Age of Onset   Heart attack Father     Allergies  Allergen Reactions   Diphenhydramine Other (See Comments)    Severe restless legs    Review of Systems  All other systems reviewed and are negative.  Except as noted above.     OBJECTIVE:    Vitals:   06/10/22 0901  BP: 134/73  Pulse: 92  Resp: 16  Temp: 98.2 F (36.8 C)  SpO2: 96%  Weight: 168 lb (76.2 kg)     Body mass index is 25.54 kg/m.  Physical Exam Constitutional:      Appearance: Normal appearance.  HENT:     Head: Normocephalic and atraumatic.  Eyes:     Extraocular Movements: Extraocular movements intact.     Conjunctiva/sclera: Conjunctivae normal.  Abdominal:     General: There is no distension.     Palpations: Abdomen is soft.  Musculoskeletal:     Cervical back: Normal range of motion and neck supple.  Skin:    General: Skin is warm and dry.  Neurological:     General: No focal deficit present.     Mental Status: He is alert and oriented to person, place, and time.  Psychiatric:        Mood and Affect: Mood normal.        Behavior: Behavior normal.      Labs and Microbiology:     Latest Ref Rng & Units 06/08/2022    1:48 PM 05/22/2022    6:39 AM 04/20/2022    12:56 PM  CBC  WBC 4.0 - 10.5 K/uL 16.0  6.9  8.8   Hemoglobin 13.0 - 17.0 g/dL 16.1  8.9  8.9   Hematocrit 39.0 - 52.0 % 37.1  29.4  28.8   Platelets 150 - 400 K/uL 445  265  256       Latest Ref Rng & Units 06/08/2022    1:48 PM 05/22/2022    6:39 AM 04/20/2022   12:56 PM  CMP  Glucose 70 - 99 mg/dL 096  93  045   BUN 8 - 23 mg/dL 31  16  24    Creatinine 0.61 - 1.24 mg/dL 4.09  8.11  9.14   Sodium 135 - 145 mmol/L 138  135  138   Potassium 3.5 - 5.1 mmol/L 4.9  4.4  4.5   Chloride 98 - 111 mmol/L 98  99  103   CO2 22 - 32 mmol/L 30  26  29    Calcium 8.9 - 10.3 mg/dL 9.8  8.6  8.3   Total Protein 6.5 - 8.1 g/dL 7.5  6.3  5.7   Total Bilirubin 0.3 - 1.2 mg/dL 0.4  0.6  0.2   Alkaline Phos 38 - 126 U/L 83  61  50   AST 15 - 41 U/L 13  12  11    ALT 0 - 44 U/L 14  15  19         ASSESSMENT & PLAN:    Invasive fungal sinusitis Patient here for initial evaluation of 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  There was no obvious evidence of malignancy noted on recent biopsy earlier this month and cultures grew Aspergillus.  At this time, radiation oncology is recommending medical management with antifungals and I think would  be good to get repeat imaging in about 6 weeks to see what improvement has been noted.  I wonder if the 4-6 weeks of prednisone in January-February contributed to this progressive fungal infection over the past few months.  Unfortunately, he has not begun taking the voriconazole 200mg  BID that was prescribed 10 days ago.  Discussed with his daughter the importance of consistently taking this medication and need for a trough level in about 5-7 days after consistently taking so we know if he is at a therapeutic level.  RTC 1 week for trough level and RTC 6 weeks for office visit to discuss repeat imaging if not already done. I went ahead and refilled his Voriconazole  today so he has enough on hand for the next several weeks.    Orders Placed This Encounter  Procedures   Voriconazole, Quant by LC/MS    Standing Status:   Future    Standing Expiration Date:   06/10/2023        [facility] 06/10/2022, 9:42 AM   I have personally spent 60 minutes involved in face-to-face and non-face-to-face activities for this patient on the day of the visit. Professional time spent includes the following activities: Preparing to see the patient (review of tests), Obtaining and/or reviewing separately obtained history (admission/discharge record), Performing a medically appropriate examination and/or evaluation , Ordering medications/tests/procedures, referring and communicating with other health care professionals, Documenting clinical information in the EMR, Independently interpreting results (not separately reported), Communicating results to the patient/family/caregiver, Counseling and educating the patient/family/caregiver and Care coordination (not separately reported).

## 2022-06-10 NOTE — Progress Notes (Signed)
Palliative Medicine [facility]  Telephone:([phone] Fax:([phone]) [phone]   Name: [name] Date: 06/10/2022 MRN: [id number]  DOB: 16-Sep-1945  Patient Care Team: [name], MD as PCP - General (Family Medicine) Thurmon Fair, MD as PCP - Cardiology (Cardiology) Karie Soda, MD as Consulting Physician (General Surgery) Crist Fat, MD as Consulting Physician (Urology)    INTERVAL HISTORY: [name] is a 77 y.o. 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 Palliative ask to see for symptom and pain management and goals of care.   SOCIAL HISTORY:    [name] reports that he quit smoking about 34 years ago. His smoking use included cigarettes. He has a 20.00 pack-year smoking history. He has never used smokeless tobacco. He reports that he does not currently use alcohol. He reports that he does not use drugs.  ADVANCE DIRECTIVES:  None on file CODE STATUS: Full code  PAST MEDICAL HISTORY: Past Medical History:  Diagnosis Date   Arthritis    Chronic kidney disease    only has one kidney    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 02/01/2015   Coronary artery disease    followed by Dr.Tilley   Frequent PVCs    GERD (gastroesophageal reflux disease)    Heart murmur    never has caused any problems   Hx of cancer of lung 1980's   Hyperlipidemia    Hypertension    Hypothyroidism    Incisional hernia 08/01/2015   Lung cancer 1993   Lung metastases 2019   Pneumonia    x several   Recurrent umbilical hernia 08/01/2015    ALLERGIES:  is allergic to diphenhydramine.  MEDICATIONS:  Current Outpatient Medications  Medication Sig Dispense Refill   acetaminophen (TYLENOL) 500 MG tablet Take 1,000 mg by mouth every 6 (six) hours as needed for moderate pain.      cabozantinib (CABOMETYX) 20 MG tablet Take 1 tablet (20 mg total) by mouth daily. Take on an empty stomach, 1 hour before or 2 hours after meals. (Patient not taking: Reported on 06/10/2022) 30 tablet 1   diltiazem (CARDIZEM CD) 240 MG 24 hr capsule Take 1 capsule (240 mg total) by mouth in the morning. 90 capsule 3   ELIQUIS 5 MG TABS tablet Take 1 tablet (5 mg total) by mouth 2 (two) times daily. 60 tablet 2   ezetimibe (ZETIA) 10 MG tablet Take 1 tablet (10 mg total) by mouth daily. (Patient taking differently: Take 10 mg by mouth 2 (two) times a week.) 90 tablet 3   fluticasone (FLONASE) 50 MCG/ACT nasal spray Place 2 sprays into both nostrils daily as needed for allergies.     hydrocortisone (CORTEF) 10 MG tablet Please take 10 mg in am and 5 mg in PM. Please allow for EMG dosing 160 tablet 3   levothyroxine (SYNTHROID) 150 MCG tablet TAKE 1 TABLET BY MOUTH EVERY OTHER DAY 30 tablet 3   levothyroxine (SYNTHROID) 175 MCG tablet TAKE 1 TABLET BY MOUTH EVERY OTHER DAY 30 tablet 3   loratadine (CLARITIN) 10 MG tablet Take 10 mg by mouth daily as needed for allergies.     morphine (MS CONTIN) 15 MG 12 hr tablet Take 1 tablet (15 mg total) by mouth every 12 (twelve) hours. (Patient not taking: Reported on 06/10/2022) 30 tablet 0   Multiple Vitamins-Minerals (CENTRUM SILVER  50+MEN) TABS Take 1 tablet by mouth daily with breakfast.     mupirocin ointment (BACTROBAN) 2 % Place 1 Application into the nose daily as needed (sores).     oxyCODONE-acetaminophen (PERCOCET/ROXICET) 5-325 MG tablet Take 1 tablet by mouth every 6 (six) hours as needed for severe pain. 60 tablet 0   traZODone (DESYREL) 150 MG tablet Take 150 mg by mouth at bedtime as needed for sleep.     VITAMIN D PO Take 1 capsule by mouth daily.     voriconazole (VFEND) 200 MG tablet Take 1 tablet (200 mg total) by mouth 2 (two) times daily. 84 tablet 0   No current facility-administered medications for this visit.    VITAL SIGNS: There were  no vitals taken for this visit. There were no vitals filed for this visit.  Estimated body mass index is 25.23 kg/m as calculated from the following:   Height as of an earlier encounter on 06/11/22:  (1.727 m).   Weight as of an earlier encounter on 06/11/22: 75.3 kg.     Latest Ref Rng & Units 06/11/2022    7:55 AM 06/08/2022    1:48 PM 05/22/2022    6:39 AM  CBC  WBC 4.0 - 10.5 K/uL 9.9  16.0  6.9   Hemoglobin 13.0 - 17.0 g/dL 9.7  16.1  8.9   Hematocrit 39.0 - 52.0 % 31.0  37.1  29.4   Platelets 150 - 400 K/uL 338  445  265        Latest Ref Rng & Units 06/11/2022    7:55 AM 06/08/2022    1:48 PM 05/22/2022    6:39 AM  CMP  Glucose 70 - 99 mg/dL 096  045  93   BUN 8 - 23 mg/dL Creatinine 0.61 - 1.24 mg/dL 4.09  8.11  9.14   Sodium 135 - 145 mmol/L 141  138  135   Potassium 3.5 - 5.1 mmol/L 3.9  4.9  4.4   Chloride 98 - 111 mmol/L 103  98  99   CO2 22 - 32 mmol/L Calcium 8.9 - 10.3 mg/dL 9.2  9.8  8.6   Total Protein 6.5 - 8.1 g/dL 6.8  7.5  6.3   Total Bilirubin 0.3 - 1.2 mg/dL 0.3  0.4  0.6   Alkaline Phos 38 - 126 U/L 67  83  61   AST 15 - 41 U/L ALT 0 - 44 U/L PERFORMANCE STATUS (ECOG) : 1 - Symptomatic but completely ambulatory   Physical Exam General: NAD, weak appearing Cardiovascular: slightly irregular Pulmonary: normal breathing pattern Abdomen: soft, nontender, + bowel sounds Extremities: no edema, no joint deformities Skin: no rashes, pale Neurological: lethargic, slowed speech, oriented x 4  IMPRESSION: [name] is seen in symptom management room where he is receiving IVF. Daughter has accompanied patient today.   Fatigue: [name] is extremely fatigued and has slight delay with responses, however is oriented and appropriate with conversation. Daughter reports that the weekend was especially difficult. States that he "layed around and didn't want to do anything." Reports that patient has been  forgetful and forgot to take his anti-fungal medication for the past few days (patient being treated for fungal sinus infection).  MS Contin was discontinued on 4/22 due to somnolence and elevated kidney function. Today,  kidney function and WBC count are within normal range. Hgb is 9.7 and patient was advised by oncology to begin iron supplement and vitamin C. Additionally, daughter reports that patient has not needed short-acting pain medication for the past two days. He currently denies pain. Recommend continuing Oxycodone 5/325 mg on an as-needed basis. Will not resume long-acting MS Contin at this time.  2.  Dehydration: Mr. Mccauley received IVF on Monday - reports that there was some improvement in the way he felt following intervention and grateful that he is receiving IVF today.   Mr. Giannini denies nausea, vomiting, constipation, and diarrhea. Appetite is poor and he attributes this to his fatigue. Hopeful for improved appetite with rehydration and resolution of fungal infection.   PLAN: Will not resume long-acting MS Contin at this time, as pain is currently controlled. Continue with Oxycodone 5/325 mg every 6 hours as needed for moderate to severe pain. IV fluids today while in clinic over 2 hours. Ongoing symptom management and goals of care support. Palliative will plan to see patient back in 3-4 weeks in collaboration to other oncology appointments.  Patient knows to contact our office if needed sooner.   Patient expressed understanding and was in agreement with this plan. He also understands that He can call the clinic at any time with any questions, concerns, or complaints.   Any controlled substances utilized were prescribed in the context of palliative care. PDMP has been reviewed.   I assessed patient with Marylene Land, NP Student. Agree with above findings.   Visit consisted of counseling and education dealing with the complex and emotionally intense issues of symptom management and  palliative care in the setting of serious and potentially life-threatening illness.Greater than 50%  of this time was spent counseling and coordinating care related to the above assessment and plan.   Signed by: Katy Apo, RN MSN [facility] / NP Student   Willette Alma, AGPCNP-BC  Palliative Medicine Team/[facility]  *Please note that this is a verbal dictation therefore any spelling or grammatical errors are due to the "[employer]agon [employer] One" system interpretation.

## 2022-06-10 NOTE — Patient Instructions (Signed)
Thank you for coming to see me today. It was a pleasure seeing you.  To Do: Start taking Voriconazole  Twice Daily.  I have sent new prescription to pharmacy. Come back next week in the morning once he has been taking regularly for about 7 days to get a trough level to ensure the dose is correct. Do not take the medication on morning of lab draw as this needs to be done about 12 hours after last dose Follow up with me in 6 weeks.  If you have any questions or concerns, please do not hesitate to call the office at [phone].  Take Care,   Gwynn Burly

## 2022-06-11 ENCOUNTER — Encounter: Payer: Self-pay | Admitting: Medical Oncology

## 2022-06-11 ENCOUNTER — Encounter: Payer: Self-pay | Admitting: Nurse Practitioner

## 2022-06-11 ENCOUNTER — Encounter: Payer: Self-pay | Admitting: Internal Medicine

## 2022-06-11 ENCOUNTER — Other Ambulatory Visit: Payer: Self-pay

## 2022-06-11 ENCOUNTER — Inpatient Hospital Stay: Payer: Medicare HMO | Admitting: Internal Medicine

## 2022-06-11 ENCOUNTER — Inpatient Hospital Stay: Payer: Medicare HMO

## 2022-06-11 ENCOUNTER — Other Ambulatory Visit: Payer: Medicare HMO

## 2022-06-11 ENCOUNTER — Ambulatory Visit: Payer: Medicare HMO | Admitting: Internal Medicine

## 2022-06-11 ENCOUNTER — Inpatient Hospital Stay (HOSPITAL_BASED_OUTPATIENT_CLINIC_OR_DEPARTMENT_OTHER): Payer: Medicare HMO | Admitting: Nurse Practitioner

## 2022-06-11 VITALS — BP 149/77 | HR 81 | Resp 17

## 2022-06-11 VITALS — BP 118/67 | HR 87 | Temp 97.7°F | Resp 18 | Ht 68.0 in | Wt 165.9 lb

## 2022-06-11 DIAGNOSIS — C787 Secondary malignant neoplasm of liver and intrahepatic bile duct: Secondary | ICD-10-CM | POA: Diagnosis not present

## 2022-06-11 DIAGNOSIS — C641 Malignant neoplasm of right kidney, except renal pelvis: Secondary | ICD-10-CM

## 2022-06-11 DIAGNOSIS — R530 Neoplastic (malignant) related fatigue: Secondary | ICD-10-CM | POA: Diagnosis not present

## 2022-06-11 DIAGNOSIS — C786 Secondary malignant neoplasm of retroperitoneum and peritoneum: Secondary | ICD-10-CM | POA: Diagnosis not present

## 2022-06-11 DIAGNOSIS — R5383 Other fatigue: Secondary | ICD-10-CM | POA: Diagnosis not present

## 2022-06-11 DIAGNOSIS — G893 Neoplasm related pain (acute) (chronic): Secondary | ICD-10-CM

## 2022-06-11 DIAGNOSIS — Z515 Encounter for palliative care: Secondary | ICD-10-CM

## 2022-06-11 DIAGNOSIS — E860 Dehydration: Secondary | ICD-10-CM | POA: Diagnosis not present

## 2022-06-11 LAB — CBC WITH DIFFERENTIAL/PLATELET
Abs Immature Granulocytes: 0.03 10*3/uL (ref 0.00–0.07)
Basophils Absolute: 0 10*3/uL (ref 0.0–0.1)
Basophils Relative: 0 %
Eosinophils Absolute: 0.1 10*3/uL (ref 0.0–0.5)
Eosinophils Relative: 1 %
HCT: 31 % — ABNORMAL LOW (ref 39.0–52.0)
Hemoglobin: 9.7 g/dL — ABNORMAL LOW (ref 13.0–17.0)
Immature Granulocytes: 0 %
Lymphocytes Relative: 14 %
Lymphs Abs: 1.4 10*3/uL (ref 0.7–4.0)
MCH: 28 pg (ref 26.0–34.0)
MCHC: 31.3 g/dL (ref 30.0–36.0)
MCV: 89.3 fL (ref 80.0–100.0)
Monocytes Absolute: 0.9 10*3/uL (ref 0.1–1.0)
Monocytes Relative: 9 %
Neutro Abs: 7.4 10*3/uL (ref 1.7–7.7)
Neutrophils Relative %: 76 %
Platelets: 338 10*3/uL (ref 150–400)
RBC: 3.47 MIL/uL — ABNORMAL LOW (ref 4.22–5.81)
RDW: 16.3 % — ABNORMAL HIGH (ref 11.5–15.5)
WBC: 9.9 10*3/uL (ref 4.0–10.5)
nRBC: 0 % (ref 0.0–0.2)

## 2022-06-11 LAB — COMPREHENSIVE METABOLIC PANEL
ALT: 14 U/L (ref 0–44)
AST: 12 U/L — ABNORMAL LOW (ref 15–41)
Albumin: 3.5 g/dL (ref 3.5–5.0)
Alkaline Phosphatase: 67 U/L (ref 38–126)
Anion gap: 9 (ref 5–15)
BUN: 23 mg/dL (ref 8–23)
CO2: 29 mmol/L (ref 22–32)
Calcium: 9.2 mg/dL (ref 8.9–10.3)
Chloride: 103 mmol/L (ref 98–111)
Creatinine, Ser: 1.17 mg/dL (ref 0.61–1.24)
GFR, Estimated: >60 mL/min (ref >60)
Glucose, Bld: 115 mg/dL — ABNORMAL HIGH (ref 70–99)
Potassium: 3.9 mmol/L (ref 3.5–5.1)
Sodium: 141 mmol/L (ref 135–145)
Total Bilirubin: 0.3 mg/dL (ref 0.3–1.2)
Total Protein: 6.8 g/dL (ref 6.5–8.1)

## 2022-06-11 MED ORDER — SODIUM CHLORIDE 0.9 % IV SOLN: Freq: Once | INTRAVENOUS | Status: AC

## 2022-06-11 NOTE — Patient Instructions (Signed)

## 2022-06-11 NOTE — Progress Notes (Signed)
Arrived to [facility] .

## 2022-06-11 NOTE — Progress Notes (Signed)
[facility] [phone]   [phone]  OFFICE PROGRESS NOTE  Alex Mask, MD [address] [place] [address]  DIAGNOSIS: Stage IV clear-cell renal cell carcinoma diagnosed initially in 2016 as localized T1 a disease with evidence of metastatic disease to the liver and omentum in 2019.  PRIOR THERAPY: 1) status post radical right renal nephrectomy under the care of Dr. Marlou Perry on February 01, 2015 and the final pathology revealed 3.3 cm clear renal cell carcinoma with Fuhrman grade 3. 2) status post omental biopsy in July 2019 that confirmed the presence of recurrent metastatic renal cell carcinoma. 3) status post treatment with immunotherapy with ipilimumab 1 Mg/KG and nivolumab 3 mg/KG every 3 weeks started 09/21/2017 status post 4 cycles and the [name] did not receive any additional immunotherapy secondary to adrenal insufficiency and panhypopituitarism 4) status post radiotherapy to the abdominal lymph node completed November 08, 2019.  CURRENT THERAPY: Cabometyx initially started at 40 mg p.o. daily in March 2023 and reduced to 20 mg p.o. daily in April 2023.  INTERVAL HISTORY: [name] 77 y.o. male returns to the clinic today for follow-up visit accompanied by his daughter [name].  The [name] is feeling tired and fatigued today.  He was here at the cancer center last week for IV fluid. He was seen by [name] with [facility].  He underwent biopsy of the maxillary sinus that was negative for malignancy but showed evidence for fungal infection with Aspergillus Fumigatus. The [name] was recently diagnosed with fungal sinusitis due to Aspergillus Fumigatus and currently on treatment with voriconazole by Dr. Gwynn Perry from the infectious disease department.  He will continue on this treatment for at least 4-6 more weeks.  He denied having any current chest pain, shortness of breath, cough or hemoptysis.  He has  no nausea, vomiting, diarrhea or constipation.  He is here today for evaluation and recommendation regarding his condition.  He has been off treatment with Cabometyx for more than 2 months now.  MEDICAL HISTORY: Past Medical History:  Diagnosis Date   Arthritis    Chronic kidney disease    only has one kidney    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 02/01/2015   Coronary artery disease    followed by [facility]   Frequent PVCs    GERD (gastroesophageal reflux disease)    Heart murmur    never has caused any problems   Hx of cancer of lung 1980's   Hyperlipidemia    Hypertension    Hypothyroidism    Incisional hernia 08/01/2015   Lung cancer 1993   Lung metastases 2019   Pneumonia    x several   Recurrent umbilical hernia 08/01/2015    ALLERGIES:  is allergic to diphenhydramine.  MEDICATIONS:  Current Outpatient Medications  Medication Sig Dispense Refill   acetaminophen (TYLENOL) 500 MG tablet Take 1,000 mg by mouth every 6 (six) hours as needed for moderate pain.     cabozantinib (CABOMETYX) 20 MG tablet Take 1 tablet (20 mg total) by mouth daily. Take on an empty stomach, 1 hour before or 2 hours after meals. ([name] not taking: Reported on 06/10/2022) 30 tablet 1   diltiazem (CARDIZEM CD) 240 MG 24 hr capsule Take 1 capsule (240 mg total) by mouth in the morning. 90 capsule 3   ELIQUIS 5 MG TABS tablet Take 1 tablet (5 mg total) by mouth 2 (two) times daily. 60  tablet 2   ezetimibe (ZETIA) 10 MG tablet Take 1 tablet (10 mg total) by mouth daily. ([name] taking differently: Take 10 mg by mouth 2 (two) times a week.) 90 tablet 3   fluticasone (FLONASE) 50 MCG/ACT nasal spray Place 2 sprays into both nostrils daily as needed for allergies.     hydrocortisone (CORTEF) 10 MG tablet Please take 10 mg in am and 5 mg in PM. Please allow for EMG dosing 160 tablet 3   levothyroxine (SYNTHROID) 150 MCG tablet TAKE 1 TABLET BY MOUTH EVERY OTHER DAY 30 tablet 3    levothyroxine (SYNTHROID) 175 MCG tablet TAKE 1 TABLET BY MOUTH EVERY OTHER DAY 30 tablet 3   loratadine (CLARITIN) 10 MG tablet Take 10 mg by mouth daily as needed for allergies.     morphine (MS CONTIN) 15 MG 12 hr tablet Take 1 tablet (15 mg total) by mouth every 12 (twelve) hours. ([name] not taking: Reported on 06/10/2022) 30 tablet 0   Multiple Vitamins-Minerals (CENTRUM SILVER 50+MEN) TABS Take 1 tablet by mouth daily with breakfast.     mupirocin ointment (BACTROBAN) 2 % Place 1 Application into the nose daily as needed (sores).     oxyCODONE-acetaminophen (PERCOCET/ROXICET) 5-325 MG tablet Take 1 tablet by mouth every 6 (six) hours as needed for severe pain. 60 tablet 0   traZODone (DESYREL) 150 MG tablet Take 150 mg by mouth at bedtime as needed for sleep.     VITAMIN D PO Take 1 capsule by mouth daily.     voriconazole (VFEND) 200 MG tablet Take 1 tablet (200 mg total) by mouth 2 (two) times daily. 84 tablet 0   No current facility-administered medications for this visit.    SURGICAL HISTORY:  Past Surgical History:  Procedure Laterality Date   APPENDECTOMY  age 23   ARTERY BIOPSY Left 04/10/2022   Procedure: LEFT TEMPORAL ARTERY BIOPSY;  Surgeon: Alex Hint, MD;  Location: [facility] OR;  Service: Vascular;  Laterality: Left;   CHOLECYSTECTOMY  10/18/1978   EYE SURGERY Bilateral 2019   cataract   LAPAROSCOPIC LYSIS OF ADHESIONS N/A 08/01/2015   Procedure: LAPAROSCOPIC LYSIS OF ADHESIONS;  Surgeon: Alex Soda, MD;  Location: [facility] ORS;  Service: General;  Laterality: N/A;   LUNG LOBECTOMY  02/16/1990   lung cancer- [name] has staples in lung not to have MRI per [name]    MAXILLARY ANTROSTOMY Left 05/22/2022   Procedure: MAXILLARY ANTROSTOMY;  Surgeon: Alex Boom, DO;  Location: [facility] OR;  Service: ENT;  Laterality: Left;   PILONIDAL CYST EXCISION  02/16/2009   Dr [name]   ROBOTIC ASSITED PARTIAL NEPHRECTOMY Right 02/01/2015   Procedure: RIGHT ROBOTIC ASSISTED  LAPAROSOCOPY NEPHRECTOMY;  Surgeon: Alex Fat, MD;  Location: [facility] ORS;  Service: Urology;  Laterality: Right;   SINUS ENDO WITH FUSION Left 05/22/2022   Procedure: SINUS ENDO WITH FUSION;  Surgeon: Alex Boom, DO;  Location: [facility] OR;  Service: ENT;  Laterality: Left;   VENTRAL HERNIA REPAIR N/A 08/01/2015   Procedure: LAPAROSCOPIC VENTRAL WALL HERNIA WITH MESH;  Surgeon: Alex Soda, MD;  Location: [facility] ORS;  Service: General;  Laterality: N/A;    REVIEW OF SYSTEMS:  Constitutional: positive for anorexia, fatigue, and weight loss Eyes: negative Ears, nose, mouth, throat, and face: positive for nasal congestion and sore mouth Respiratory: negative Cardiovascular: negative Gastrointestinal: negative Genitourinary:negative Integument/breast: negative Hematologic/lymphatic: negative Musculoskeletal:negative Neurological: negative Behavioral/Psych: positive for sleep disturbance Endocrine: negative Allergic/Immunologic: negative   PHYSICAL EXAMINATION: General appearance: alert,  cooperative, fatigued, and no distress Head: Normocephalic, without obvious abnormality, atraumatic Neck: no adenopathy, no JVD, supple, symmetrical, trachea midline, and thyroid not enlarged, symmetric, no tenderness/mass/nodules Lymph nodes: Cervical, supraclavicular, and axillary nodes normal. Resp: clear to auscultation bilaterally Back: symmetric, no curvature. ROM normal. No CVA tenderness. Cardio: regular rate and rhythm, S1, S2 normal, no murmur, click, rub or gallop GI: soft, non-tender; bowel sounds normal; no masses,  no organomegaly Extremities: extremities normal, atraumatic, no cyanosis or edema Neurologic: Alert and oriented X 3, normal strength and tone. Normal symmetric reflexes. Normal coordination and gait  ECOG PERFORMANCE STATUS: 1 - Symptomatic but completely ambulatory  Blood pressure 118/67, pulse 87, temperature 97.7 F (36.5 C), temperature source Temporal, resp. rate 18,  height  (1.727 m), weight 165 lb 14.4 oz (75.3 kg), SpO2 95 %.  LABORATORY DATA: Lab Results  Component Value Date   WBC 16.0 (H) 06/08/2022   HGB 11.3 (L) 06/08/2022   HCT 37.1 (L) 06/08/2022   MCV 91.6 06/08/2022   PLT 445 (H) 06/08/2022      Chemistry      Component Value Date/Time   NA 138 06/08/2022 1348   NA 141 08/11/2021 1252   K 4.9 06/08/2022 1348   CL 98 06/08/2022 1348   CO2 30 06/08/2022 1348   BUN 31 (H) 06/08/2022 1348   BUN 14 08/11/2021 1252   CREATININE 1.35 (H) 06/08/2022 1348      Component Value Date/Time   CALCIUM 9.8 06/08/2022 1348   ALKPHOS 83 06/08/2022 1348   AST 13 (L) 06/08/2022 1348   ALT 14 06/08/2022 1348   BILITOT 0.4 06/08/2022 1348       RADIOGRAPHIC STUDIES: CT MAXILLOFACIAL W CONTRAST  Result Date: 05/19/2022 CLINICAL DATA:  Provided history: Maxillary sinus mass. EXAM: CT MAXILLOFACIAL WITH CONTRAST TECHNIQUE: Multidetector CT imaging of the maxillofacial structures was performed with intravenous contrast. Multiplanar CT image reconstructions were also generated. RADIATION DOSE REDUCTION: This exam was performed according to the departmental dose-optimization program which includes automated exposure control, adjustment of the mA and/or kV according to [name] size and/or use of iterative reconstruction technique. CONTRAST:  70mL OMNIPAQUE IOHEXOL 300 MG/ML  SOLN COMPARISON:  Maxillofacial CT 05/03/2022. FINDINGS: Osseous: Redemonstrated destructive process involving the posterolateral wall the left maxillary sinus with osseous thinning and erosion. Progressive extension of this process into the soft tissues of the left masticator space since the prior examination of 05/03/2022. The region of soft tissue involvement in the left masticator space now measures 4.3 x 3.0 cm in transaxial dimensions (previously 4.3 x 2.2 cm). Superimposed within the left masticator space, there is a 2.9 x 1.6 cm peripherally enhancing gas and fluid containing  collection which is new from the prior study (for instance as seen on series 3, image 47). Nonspecific circumferential soft tissue within the adjacent left maxillary sinus measuring up to 1.4 cm in thickness, similar to the prior exam. Orbits: No orbital mass or acute orbital finding. Sinuses: Mild-to-moderate mucosal thickening within the right maxillary sinus, slightly progressed. Circumferential soft tissue within the left maxillary sinus measuring up to 1.4 cm in thickness, similar to the prior exam. Redemonstrated tiny mucous retention cyst within the right sphenoid sinus. Mild mucosal thickening within the left sphenoid, bilateral ethmoid and bilateral frontal sinuses, similar to the prior study. Soft tissues: Aggressive process involving the posterolateral wall the left maxillary sinus and left masticator space as described above. Limited intracranial: No evidence of an acute intracranial abnormality within the field  of view. Impression #1 will be called to the ordering clinician or representative by the Radiologist Assistant, and communication documented in the PACS or [employer]. IMPRESSION: 1. Redemonstrated destructive process involving the posterolateral wall the left maxillary sinus with osseous thinning and erosion. Progressive extension of this process into the soft tissues of the left masticator space, as described. This includes a 2.9 x 1.6 cm peripherally enhancing gas and fluid containing collection within the left masticator space, which is new from the prior examination of 05/03/2022. Primary differential considerations are unchanged and include an aggressive infection (resulting from fungal etiologies among others) with new masticator space abscess, or metastatic disease with both osseous involvement and necrotic soft tissue mass. 2. Paranasal sinus disease elsewhere, as above. Electronically Signed   By: Jackey Loge D.O.   On: 05/19/2022 12:09    ASSESSMENT AND PLAN: This is a very  pleasant 77 years old white male with Stage IV clear-cell renal cell carcinoma diagnosed initially in 2016 as localized T1 a disease with evidence of metastatic disease to the liver and omentum in 2019. The [name] underwent the following treatment: 1) status post radical right renal nephrectomy under the care of Dr. Marlou Perry on February 01, 2015 and the final pathology revealed 3.3 cm clear renal cell carcinoma with Fuhrman grade 3. 2) status post omental biopsy in July 2019 that confirmed the presence of recurrent metastatic renal cell carcinoma. 3) status post treatment with immunotherapy with ipilimumab 1 Mg/KG and nivolumab 3 mg/KG every 3 weeks started 09/21/2017 status post 4 cycles and the [name] did not receive any additional immunotherapy secondary to adrenal insufficiency and panhypopituitarism 4) status post radiotherapy to the abdominal lymph node completed November 08, 2019. He is currently on treatment with Cabometyx 20 mg p.o. daily started March 2023.  His treatment has been on hold for the last 2 months because of the recent sinus issues and his final diagnosis was fungal sinusitis and currently on treatment with voriconazole by infectious disease.  The [name] will continue on this treatment for 4-6 more weeks. I recommended for him to continue holding his treatment with Cabometyx until resolution of the fungal infection. I will see him back for follow-up visit in around 2 months with repeat blood work and will consider repeating imaging studies if not done by infectious disease at that time. For the anemia, I encouraged the [name] to start taking oral iron tablet with vitamin C 1 tablet every other day. The [name] was advised to call immediately if he has any other concerning symptoms in the interval. The [name] voices understanding of current disease status and treatment options and is in agreement with the current care plan.  All questions were answered. The [name] knows to call  the clinic with any problems, questions or concerns. We can certainly see the [name] much sooner if necessary. The total time spent in the appointment was 30 minutes.  Disclaimer: This note was dictated with voice recognition software. Similar sounding words can inadvertently be transcribed and may not be corrected upon review.

## 2022-06-15 DIAGNOSIS — J3489 Other specified disorders of nose and nasal sinuses: Secondary | ICD-10-CM | POA: Diagnosis not present

## 2022-06-15 DIAGNOSIS — R040 Epistaxis: Secondary | ICD-10-CM | POA: Diagnosis not present

## 2022-06-15 DIAGNOSIS — Z9889 Other specified postprocedural states: Secondary | ICD-10-CM | POA: Diagnosis not present

## 2022-06-15 DIAGNOSIS — B49 Unspecified mycosis: Secondary | ICD-10-CM | POA: Diagnosis not present

## 2022-06-15 DIAGNOSIS — J329 Chronic sinusitis, unspecified: Secondary | ICD-10-CM | POA: Diagnosis not present

## 2022-06-16 ENCOUNTER — Other Ambulatory Visit: Payer: Self-pay | Admitting: Otolaryngology

## 2022-06-16 DIAGNOSIS — J329 Chronic sinusitis, unspecified: Secondary | ICD-10-CM

## 2022-06-18 ENCOUNTER — Other Ambulatory Visit: Payer: Medicare HMO

## 2022-06-18 ENCOUNTER — Telehealth: Payer: Self-pay

## 2022-06-18 ENCOUNTER — Other Ambulatory Visit: Payer: Self-pay

## 2022-06-18 DIAGNOSIS — B4489 Other forms of aspergillosis: Secondary | ICD-10-CM | POA: Diagnosis not present

## 2022-06-18 DIAGNOSIS — B49 Unspecified mycosis: Secondary | ICD-10-CM | POA: Diagnosis not present

## 2022-06-18 DIAGNOSIS — J329 Chronic sinusitis, unspecified: Secondary | ICD-10-CM | POA: Diagnosis not present

## 2022-06-18 NOTE — Telephone Encounter (Signed)
Patient came in today for voriconazole trough and has some questions regarding the medication. Patient complaining of insomnia and night sweats since starting the medication. He also asked if he could take ibuprofen with the medication.  I spoke with Marchelle Folks with our pharmacy team regarding patient's concerns.  Per Marchelle Folks there is no evidence that shows voriconazole causing insomnia or night sweats.  Patient can also take with Ibuprofen.   I spoke with patient and his daughter and advised them that per our pharmacy team patient should be taking the medication as soon as he wakes up in the morning and the pm dose 12 hours after.  Patient also advised to try to take the PM dose a little before he goes to bed.  He also asked if he could take the medication with his levothyroxine and per Marchelle Folks that is fine.  Patient will call back if symptoms do not improve.  [name] [name], CMA

## 2022-06-21 LAB — VORICONAZOLE QUANT BY LC/MS: Voriconazole, Quant, by LC/MS: 8.7 ug/mL

## 2022-06-22 ENCOUNTER — Telehealth: Payer: Self-pay

## 2022-06-22 NOTE — Telephone Encounter (Signed)
Pt daughter called reporting that pt was feeling weak and tired. Pt reported not eating or drinking much d/t maxillary sinusitis, which they believe is causing the dehydration. Otherwise pt is not having pain, n/v, or diarrhea. Per pt daughter pt is oriented, just weak and tired. Pt to come in tomorrow 06/23/22 for IVF. Pt daughter verbalized understanding of upcoming schedule and ED precautions, which were discussed. No further needs at this point.

## 2022-06-22 NOTE — Progress Notes (Signed)
Yes, generally the recommendation is to decrease the voriconazole dose by 50-100 mg and then wait a similar period of time (~7 days) for the new steady state before rechecking a trough.   You can crush/split voriconazole even though it is film coated per several resources. He could half either his morning or evening dose to decrease from a 400 mg daily dose to a 300 mg daily dose. Otherwise, any change by 50mg  would need a new prescription.

## 2022-06-23 ENCOUNTER — Telehealth: Payer: Self-pay | Admitting: Nurse Practitioner

## 2022-06-23 ENCOUNTER — Other Ambulatory Visit: Payer: Self-pay | Admitting: Pharmacist

## 2022-06-23 ENCOUNTER — Encounter: Payer: Self-pay | Admitting: Nurse Practitioner

## 2022-06-23 ENCOUNTER — Other Ambulatory Visit: Payer: Self-pay

## 2022-06-23 ENCOUNTER — Encounter (HOSPITAL_COMMUNITY): Payer: Self-pay

## 2022-06-23 ENCOUNTER — Inpatient Hospital Stay: Payer: Medicare HMO | Attending: Oncology | Admitting: Nurse Practitioner

## 2022-06-23 ENCOUNTER — Telehealth: Payer: Self-pay

## 2022-06-23 ENCOUNTER — Inpatient Hospital Stay (HOSPITAL_COMMUNITY)
Admit: 2022-06-23 | Discharge: 2022-06-28 | DRG: 641 | Disposition: A | Discharge status: Home / Self Care | Payer: Medicare HMO | Attending: Internal Medicine | Admitting: Internal Medicine

## 2022-06-23 ENCOUNTER — Inpatient Hospital Stay: Payer: Medicare HMO

## 2022-06-23 VITALS — BP 153/76 | HR 77 | Temp 98.3°F | Resp 18 | Wt 165.1 lb

## 2022-06-23 DIAGNOSIS — C787 Secondary malignant neoplasm of liver and intrahepatic bile duct: Secondary | ICD-10-CM | POA: Diagnosis present

## 2022-06-23 DIAGNOSIS — E039 Hypothyroidism, unspecified: Secondary | ICD-10-CM | POA: Diagnosis not present

## 2022-06-23 DIAGNOSIS — Z9221 Personal history of antineoplastic chemotherapy: Secondary | ICD-10-CM

## 2022-06-23 DIAGNOSIS — G2581 Restless legs syndrome: Secondary | ICD-10-CM | POA: Diagnosis present

## 2022-06-23 DIAGNOSIS — E861 Hypovolemia: Secondary | ICD-10-CM | POA: Diagnosis present

## 2022-06-23 DIAGNOSIS — Z888 Allergy status to other drugs, medicaments and biological substances status: Secondary | ICD-10-CM

## 2022-06-23 DIAGNOSIS — I2510 Atherosclerotic heart disease of native coronary artery without angina pectoris: Secondary | ICD-10-CM | POA: Diagnosis present

## 2022-06-23 DIAGNOSIS — Z85118 Personal history of other malignant neoplasm of bronchus and lung: Secondary | ICD-10-CM

## 2022-06-23 DIAGNOSIS — Z981 Arthrodesis status: Secondary | ICD-10-CM

## 2022-06-23 DIAGNOSIS — E860 Dehydration: Secondary | ICD-10-CM | POA: Diagnosis present

## 2022-06-23 DIAGNOSIS — D638 Anemia in other chronic diseases classified elsewhere: Secondary | ICD-10-CM | POA: Diagnosis present

## 2022-06-23 DIAGNOSIS — E871 Hypo-osmolality and hyponatremia: Secondary | ICD-10-CM | POA: Diagnosis not present

## 2022-06-23 DIAGNOSIS — R531 Weakness: Secondary | ICD-10-CM | POA: Diagnosis not present

## 2022-06-23 DIAGNOSIS — Y92009 Unspecified place in unspecified non-institutional (private) residence as the place of occurrence of the external cause: Secondary | ICD-10-CM

## 2022-06-23 DIAGNOSIS — I10 Essential (primary) hypertension: Secondary | ICD-10-CM | POA: Diagnosis not present

## 2022-06-23 DIAGNOSIS — I129 Hypertensive chronic kidney disease with stage 1 through stage 4 chronic kidney disease, or unspecified chronic kidney disease: Secondary | ICD-10-CM | POA: Diagnosis present

## 2022-06-23 DIAGNOSIS — Z902 Acquired absence of lung [part of]: Secondary | ICD-10-CM

## 2022-06-23 DIAGNOSIS — Z79890 Hormone replacement therapy (postmenopausal): Secondary | ICD-10-CM

## 2022-06-23 DIAGNOSIS — C649 Malignant neoplasm of unspecified kidney, except renal pelvis: Secondary | ICD-10-CM | POA: Diagnosis present

## 2022-06-23 DIAGNOSIS — Z8249 Family history of ischemic heart disease and other diseases of the circulatory system: Secondary | ICD-10-CM

## 2022-06-23 DIAGNOSIS — B4489 Other forms of aspergillosis: Secondary | ICD-10-CM

## 2022-06-23 DIAGNOSIS — Z7901 Long term (current) use of anticoagulants: Secondary | ICD-10-CM

## 2022-06-23 DIAGNOSIS — R5381 Other malaise: Secondary | ICD-10-CM | POA: Diagnosis present

## 2022-06-23 DIAGNOSIS — E279 Disorder of adrenal gland, unspecified: Secondary | ICD-10-CM

## 2022-06-23 DIAGNOSIS — Z87891 Personal history of nicotine dependence: Secondary | ICD-10-CM

## 2022-06-23 DIAGNOSIS — R4589 Other symptoms and signs involving emotional state: Secondary | ICD-10-CM

## 2022-06-23 DIAGNOSIS — E782 Mixed hyperlipidemia: Secondary | ICD-10-CM

## 2022-06-23 DIAGNOSIS — Z515 Encounter for palliative care: Secondary | ICD-10-CM

## 2022-06-23 DIAGNOSIS — C641 Malignant neoplasm of right kidney, except renal pelvis: Secondary | ICD-10-CM

## 2022-06-23 DIAGNOSIS — R630 Anorexia: Secondary | ICD-10-CM | POA: Diagnosis present

## 2022-06-23 DIAGNOSIS — J329 Chronic sinusitis, unspecified: Secondary | ICD-10-CM | POA: Diagnosis present

## 2022-06-23 DIAGNOSIS — T367X5A Adverse effect of antifungal antibiotics, systemically used, initial encounter: Secondary | ICD-10-CM | POA: Diagnosis present

## 2022-06-23 DIAGNOSIS — Z7189 Other specified counseling: Secondary | ICD-10-CM

## 2022-06-23 DIAGNOSIS — Z85528 Personal history of other malignant neoplasm of kidney: Secondary | ICD-10-CM

## 2022-06-23 DIAGNOSIS — E785 Hyperlipidemia, unspecified: Secondary | ICD-10-CM | POA: Diagnosis present

## 2022-06-23 DIAGNOSIS — F419 Anxiety disorder, unspecified: Secondary | ICD-10-CM | POA: Diagnosis present

## 2022-06-23 DIAGNOSIS — Z923 Personal history of irradiation: Secondary | ICD-10-CM

## 2022-06-23 DIAGNOSIS — Z9049 Acquired absence of other specified parts of digestive tract: Secondary | ICD-10-CM

## 2022-06-23 DIAGNOSIS — E2740 Unspecified adrenocortical insufficiency: Secondary | ICD-10-CM | POA: Diagnosis present

## 2022-06-23 DIAGNOSIS — Z7952 Long term (current) use of systemic steroids: Secondary | ICD-10-CM

## 2022-06-23 DIAGNOSIS — Z905 Acquired absence of kidney: Secondary | ICD-10-CM

## 2022-06-23 DIAGNOSIS — Z79899 Other long term (current) drug therapy: Secondary | ICD-10-CM

## 2022-06-23 LAB — CBC WITH DIFFERENTIAL (CANCER CENTER ONLY)
Abs Immature Granulocytes: 0.03 K/uL (ref 0.00–0.07)
Basophils Absolute: 0 K/uL (ref 0.0–0.1)
Basophils Relative: 0 %
Eosinophils Absolute: 0.1 K/uL (ref 0.0–0.5)
Eosinophils Relative: 2 %
HCT: 29.1 % — ABNORMAL LOW (ref 39.0–52.0)
Hemoglobin: 9.5 g/dL — ABNORMAL LOW (ref 13.0–17.0)
Immature Granulocytes: 0 %
Lymphocytes Relative: 18 %
Lymphs Abs: 1.3 K/uL (ref 0.7–4.0)
MCH: 27.6 pg (ref 26.0–34.0)
MCHC: 32.6 g/dL (ref 30.0–36.0)
MCV: 84.6 fL (ref 80.0–100.0)
Monocytes Absolute: 0.6 K/uL (ref 0.1–1.0)
Monocytes Relative: 8 %
Neutro Abs: 5.1 K/uL (ref 1.7–7.7)
Neutrophils Relative %: 72 %
Platelet Count: 490 K/uL — ABNORMAL HIGH (ref 150–400)
RBC: 3.44 MIL/uL — ABNORMAL LOW (ref 4.22–5.81)
RDW: 16.2 % — ABNORMAL HIGH (ref 11.5–15.5)
WBC Count: 7.3 K/uL (ref 4.0–10.5)
nRBC: 0 % (ref 0.0–0.2)

## 2022-06-23 LAB — CMP (CANCER CENTER ONLY)
ALT: 36 U/L (ref 0–44)
AST: 26 U/L (ref 15–41)
Albumin: 2.9 g/dL — ABNORMAL LOW (ref 3.5–5.0)
Alkaline Phosphatase: 120 U/L (ref 38–126)
Anion gap: 9 (ref 5–15)
BUN: 15 mg/dL (ref 8–23)
CO2: 26 mmol/L (ref 22–32)
Calcium: 8.5 mg/dL — ABNORMAL LOW (ref 8.9–10.3)
Chloride: 89 mmol/L — ABNORMAL LOW (ref 98–111)
Creatinine: 0.94 mg/dL (ref 0.61–1.24)
GFR, Estimated: >60 mL/min (ref >60)
Glucose, Bld: 139 mg/dL — ABNORMAL HIGH (ref 70–99)
Potassium: 4.1 mmol/L (ref 3.5–5.1)
Sodium: 124 mmol/L — ABNORMAL LOW (ref 135–145)
Total Bilirubin: 0.2 mg/dL — ABNORMAL LOW (ref 0.3–1.2)
Total Protein: 6.7 g/dL (ref 6.5–8.1)

## 2022-06-23 LAB — URINALYSIS, COMPLETE (UACMP) WITH MICROSCOPIC
Bacteria, UA: NONE SEEN
Bilirubin Urine: NEGATIVE
Glucose, UA: NEGATIVE mg/dL
Hgb urine dipstick: NEGATIVE
Ketones, ur: NEGATIVE mg/dL
Leukocytes,Ua: NEGATIVE
Nitrite: NEGATIVE
Protein, ur: 30 mg/dL — AB
Specific Gravity, Urine: 1.011 (ref 1.005–1.030)
pH: 7 (ref 5.0–8.0)

## 2022-06-23 LAB — SODIUM, URINE, RANDOM: Sodium, Ur: 137 mmol/L

## 2022-06-23 LAB — CREATININE, URINE, RANDOM: Creatinine, Urine: 42 mg/dL

## 2022-06-23 MED ORDER — SODIUM CHLORIDE 0.9 % IV SOLN: INTRAVENOUS | Status: DC

## 2022-06-23 MED ORDER — ACETAMINOPHEN 650 MG RE SUPP: 650.0000 mg | Freq: Four times a day (QID) | RECTAL | Status: DC | PRN

## 2022-06-23 MED ORDER — ACETAMINOPHEN 325 MG PO TABS
650.0000 mg | ORAL_TABLET | Freq: Four times a day (QID) | ORAL | Status: DC | PRN
  Administered 2022-06-28: 650 mg via ORAL
  Filled 2022-06-23: qty 2

## 2022-06-23 MED ORDER — MELATONIN 3 MG PO TABS
3.0000 mg | ORAL_TABLET | Freq: Every evening | ORAL | Status: DC | PRN
  Administered 2022-06-24 – 2022-06-28 (×4): 3 mg via ORAL
  Filled 2022-06-23 (×4): qty 1

## 2022-06-23 MED ORDER — SODIUM CHLORIDE 0.9 % IV SOLN: Freq: Once | INTRAVENOUS | Status: AC

## 2022-06-23 MED ORDER — ONDANSETRON HCL 4 MG/2ML IJ SOLN: 4.0000 mg | Freq: Four times a day (QID) | INTRAMUSCULAR | Status: DC | PRN

## 2022-06-23 NOTE — Telephone Encounter (Signed)
Mr. Kightlinger seen today due to complaints of ongoing weakness and fatigue.  Appetite continues to also be a challenge.  Patient shared recent changes in his voriconazole as per directed by his ID medical team. On review of lab findings today patient's sodium had significantly decreased to 124. 141 on 4/25.   I discussed case at length with infectious disease team [facility], [facility]. and Judeth Cornfield, NP).  Given significant hyponatremia and patient's intolerance to voriconazole it is suggested that he be admitted into the hospital for further management in the setting of metastatic cancer and fungal sinusitis. Recent voriconazole trough high at 8.7.   Requested direct admission under hospitalist.  Spoke to Dr. Natale Milch who agreed to be admitting provider. Patient to be notified by May[place], RN in addition to bed placement for further instructions.      Latest Ref Rng & Units 06/23/2022   12:06 PM 06/11/2022    7:55 AM 06/08/2022    1:48 PM  CBC  WBC 4.0 - 10.5 K/uL 7.3  9.9  16.0   Hemoglobin 13.0 - 17.0 g/dL 9.5  9.7  16.1   Hematocrit 39.0 - 52.0 % 29.1  31.0  37.1   Platelets 150 - 400 K/uL 490  338  445        Latest Ref Rng & Units 06/23/2022   12:06 PM 06/11/2022    7:55 AM 06/08/2022    1:48 PM  CMP  Glucose 70 - 99 mg/dL 096  045  409   BUN 8 - 23 mg/dL 15  23  31    Creatinine 0.61 - 1.24 mg/dL 8.11  9.14  7.82   Sodium 135 - 145 mmol/L 124  141  138   Potassium 3.5 - 5.1 mmol/L 4.1  3.9  4.9   Chloride 98 - 111 mmol/L 89  103  98   CO2 22 - 32 mmol/L 26  29  30    Calcium 8.9 - 10.3 mg/dL 8.5  9.2  9.8   Total Protein 6.5 - 8.1 g/dL 6.7  6.8  7.5   Total Bilirubin 0.3 - 1.2 mg/dL 0.2  0.3  0.4   Alkaline Phos 38 - 126 U/L 120  67  83   AST 15 - 41 U/L 26  12  13    ALT 0 - 44 U/L 36  14  14      Visit consisted of counseling and education dealing with the complex and emotionally intense issues of symptom management and palliative care in the setting of serious and potentially life-threatening  illness.Greater than 50%  of this time was spent counseling and coordinating care related to the above assessment and plan.  Willette Alma, AGPCNP-BC  Palliative Medicine Team/[facility]  *Please note that this is a verbal dictation therefore any spelling or grammatical errors are due to the "[employer]" system interpretation.

## 2022-06-23 NOTE — Telephone Encounter (Signed)
Patient came in today and was given fluids for worsening fatigue and dehydration. While here pt had labwork done that showed low sodium levels. Infectious diease team notified by Lowella Bandy, NP due to the patient recently taking voriconazole for maxillary sinusitis. Per ID team pt to be admitted for medication management as well and management of hyponatremia, bed placement called and pt to be directly admitted from home. This RN called pt daughter Theora Gianotti and explained the needs for an admission, what the process would be like and what she and the pt would need to do. [name] verbalized understanding and agreement with plan. Pt and family waiting for call from [facility] admitting for further instruction.

## 2022-06-23 NOTE — Telephone Encounter (Signed)
Called and spoke to daughter. Advised that his trough lab result was on the high side and that could be why he is having issues tolerating it. Advised her to give him his usual dose in the AM and cut his evening/bedtime pill in half and only give him half of the tablet. Also advised that it is really important to not stop taking this in order for it to work properly. She will start the new dose tomorrow. Made a lab appt to recheck a trough next Wednesday at 845am. Asked her to not give him his morning dose before the lab is drawn to hopefully get a true trough. She verbalized understanding and will let us know if she has any other issues come up.

## 2022-06-23 NOTE — H&P (Signed)
History and Physical      Nashwan Barba ZOX:[id number] DOB: 12-18-1945 DOA: 06/23/2022; DOS: 06/23/2022  PCP: [name], MD *** Patient coming from: home ***  I have personally briefly reviewed patient's old medical records in [facility]  Chief Complaint: ***  HPI: [name] is a 77 y.o. male with medical history significant for *** who is admitted to [facility] on 06/23/2022 with *** after presenting from home*** to [facility] ED complaining of ***.   ***        ***  ED Course:  Vital signs in the ED were notable for the following: ***  Labs were notable for the following: ***  Per my interpretation, EKG in ED demonstrated the following:  ***  Imaging and additional notable ED work-up: ***  While in the ED, the following were administered: ***  Subsequently, the patient was admitted  ***  ***red   Review of Systems: As per HPI otherwise 10 point review of systems negative.   Past Medical History:  Diagnosis Date   Arthritis    Chronic kidney disease    only has one kidney    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 02/01/2015   Coronary artery disease    followed by Dr.Tilley   Frequent PVCs    GERD (gastroesophageal reflux disease)    Heart murmur    never has caused any problems   Hx of cancer of lung 1980's   Hyperlipidemia    Hypertension    Hypothyroidism    Incisional hernia 08/01/2015   Lung cancer (HCC) 1993   Lung metastases 2019   Pneumonia    x several   Recurrent umbilical hernia 08/01/2015    Past Surgical History:  Procedure Laterality Date   APPENDECTOMY  age 25   ARTERY BIOPSY Left 04/10/2022   Procedure: LEFT TEMPORAL ARTERY BIOPSY;  Surgeon: Chuck Hint, MD;  Location: [facility] OR;  Service: Vascular;  Laterality: Left;   CHOLECYSTECTOMY  10/18/1978   EYE SURGERY Bilateral 2019   cataract   LAPAROSCOPIC LYSIS OF ADHESIONS N/A 08/01/2015   Procedure: LAPAROSCOPIC LYSIS OF ADHESIONS;   Surgeon: Karie Soda, MD;  Location: [facility] ORS;  Service: General;  Laterality: N/A;   LUNG LOBECTOMY  02/16/1990   lung cancer- patient has staples in lung not to have MRI per patient    MAXILLARY ANTROSTOMY Left 05/22/2022   Procedure: MAXILLARY ANTROSTOMY;  Surgeon: Laren Boom, DO;  Location: [facility] OR;  Service: ENT;  Laterality: Left;   PILONIDAL CYST EXCISION  02/16/2009   Dr Gerrit Friends   ROBOTIC ASSITED PARTIAL NEPHRECTOMY Right 02/01/2015   Procedure: RIGHT ROBOTIC ASSISTED LAPAROSOCOPY NEPHRECTOMY;  Surgeon: Crist Fat, MD;  Location: [facility] ORS;  Service: Urology;  Laterality: Right;   SINUS ENDO WITH FUSION Left 05/22/2022   Procedure: SINUS ENDO WITH FUSION;  Surgeon: Laren Boom, DO;  Location: [facility] OR;  Service: ENT;  Laterality: Left;   VENTRAL HERNIA REPAIR N/A 08/01/2015   Procedure: LAPAROSCOPIC VENTRAL WALL HERNIA WITH MESH;  Surgeon: Karie Soda, MD;  Location: [facility] ORS;  Service: General;  Laterality: N/A;    Social History:  reports that he quit smoking about 34 years ago. His smoking use included cigarettes. He has a 20.00 pack-year smoking history. He has never used smokeless tobacco. He reports that he does not currently use alcohol. He reports that he does not use drugs.   Allergies  Allergen Reactions   Diphenhydramine Other (See  Comments)    Severe restless legs    Family History  Problem Relation Age of Onset   Heart attack Father     Family history reviewed and not pertinent ***   Prior to Admission medications   Medication Sig Start Date End Date Taking? Authorizing Provider  acetaminophen (TYLENOL) 500 MG tablet Take 1,000 mg by mouth every 6 (six) hours as needed for moderate pain.    [provider]  cabozantinib (CABOMETYX) 20 MG tablet Take 1 tablet (20 mg total) by mouth daily. Take on an empty stomach, 1 hour before or 2 hours after meals. Patient not taking: Reported on 06/10/2022 04/28/22   Si Gaul, MD  diltiazem  (CARDIZEM CD) 240 MG 24 hr capsule Take 1 capsule (240 mg total) by mouth in the morning. 03/26/22   Croitoru, Mihai, MD  ELIQUIS 5 MG TABS tablet Take 1 tablet (5 mg total) by mouth 2 (two) times daily. 08/11/21   Corrin Parker, PA-C  ezetimibe (ZETIA) 10 MG tablet Take 1 tablet (10 mg total) by mouth daily. Patient taking differently: Take 10 mg by mouth 2 (two) times a week. 03/27/22   Croitoru, Mihai, MD  fluticasone (FLONASE) 50 MCG/ACT nasal spray Place 2 sprays into both nostrils daily as needed for allergies. 01/20/22   [provider]  hydrocortisone (CORTEF) 10 MG tablet Please take 10 mg in am and 5 mg in PM. Please allow for EMG dosing 07/23/21   Carlus Pavlov, MD  levothyroxine (SYNTHROID) 150 MCG tablet TAKE 1 TABLET BY MOUTH EVERY OTHER DAY 11/21/21   Carlus Pavlov, MD  levothyroxine (SYNTHROID) 175 MCG tablet TAKE 1 TABLET BY MOUTH EVERY OTHER DAY 11/21/21   Carlus Pavlov, MD  loratadine (CLARITIN) 10 MG tablet Take 10 mg by mouth daily as needed for allergies.    [provider]  morphine (MS CONTIN) 15 MG 12 hr tablet Take 1 tablet (15 mg total) by mouth every 12 (twelve) hours. Patient not taking: Reported on 06/10/2022 05/18/22   Heilingoetter, Cassandra L, PA-C  Multiple Vitamins-Minerals (CENTRUM SILVER 50+MEN) TABS Take 1 tablet by mouth daily with breakfast.    [provider]  mupirocin ointment (BACTROBAN) 2 % Place 1 Application into the nose daily as needed (sores).    [provider]  oxyCODONE-acetaminophen (PERCOCET/ROXICET) 5-325 MG tablet Take 1 tablet by mouth every 6 (six) hours as needed for severe pain. 06/04/22   Pickenpack-Cousar, Arty Baumgartner, NP  traZODone (DESYREL) 150 MG tablet Take 150 mg by mouth at bedtime as needed for sleep.    [provider]  VITAMIN D PO Take 1 capsule by mouth daily.    [provider]  voriconazole (VFEND) 200 MG tablet Take 1 tablet (200 mg total) by mouth 2 (two) times daily.  06/10/22 07/22/22  Kathlynn Grate, DO     Objective    Physical Exam: Vitals:   06/23/22 1814 06/23/22 2001  BP: (!) 155/76   Pulse: 81   Resp: 16   Temp: 98.1 F (36.7 C)   SpO2: 99%   Weight:  74.9 kg  Height:  5\' 8"  (1.727 m)    General: appears to be stated age; alert, oriented Skin: warm, dry, no rash Head:  AT/[place] Mouth:  Oral mucosa membranes appear moist, normal dentition Neck: supple; trachea midline Heart:  RRR; did not appreciate any M/R/G Lungs: CTAB, did not appreciate any wheezes, rales, or rhonchi Abdomen: + BS; soft, ND, NT Vascular: 2+ pedal pulses b/l;  2+ radial pulses b/l Extremities: no peripheral edema, no muscle wasting Neuro: strength and sensation intact in upper and lower extremities b/l    *** Neuro: 5/5 strength of the proximal and distal flexors and extensors of the upper and lower extremities bilaterally; sensation intact in upper and lower extremities b/l; cranial nerves II through XII grossly intact; no pronator drift; no evidence suggestive of slurred speech, dysarthria, or facial droop; Normal muscle tone. No tremors. *** Neuro: In the setting of the patient's current mental status and associated inability to follow instructions, unable to perform full neurologic exam at this time.  As such, assessment of strength, sensation, and cranial nerves is limited at this time. Patient noted to spontaneously move all 4 extremities. No tremors.  ***    Labs on Admission: I have personally reviewed following labs and imaging studies  CBC: Recent Labs  Lab 06/23/22 1206  WBC 7.3  NEUTROABS 5.1  HGB 9.5*  HCT 29.1*  MCV 84.6  PLT 490*   Basic Metabolic Panel: Recent Labs  Lab 06/23/22 1206  NA 124*  K 4.1  CL 89*  CO2 26  GLUCOSE 139*  BUN 15  CREATININE 0.94  CALCIUM 8.5*   GFR: Estimated Creatinine Clearance: 61.6 mL/min (by C-G formula based on SCr of 0.94 mg/dL). Liver Function Tests: Recent Labs  Lab 06/23/22 1206  AST  26  ALT 36  ALKPHOS 120  BILITOT 0.2*  PROT 6.7  ALBUMIN 2.9*   No results for input(s): "LIPASE", "AMYLASE" in the last 168 hours. No results for input(s): "AMMONIA" in the last 168 hours. Coagulation Profile: No results for input(s): "INR", "PROTIME" in the last 168 hours. Cardiac Enzymes: No results for input(s): "CKTOTAL", "CKMB", "CKMBINDEX", "TROPONINI" in the last 168 hours. BNP (last 3 results) No results for input(s): "PROBNP" in the last 8760 hours. HbA1C: No results for input(s): "HGBA1C" in the last 72 hours. CBG: No results for input(s): "GLUCAP" in the last 168 hours. Lipid Profile: No results for input(s): "CHOL", "HDL", "LDLCALC", "TRIG", "CHOLHDL", "LDLDIRECT" in the last 72 hours. Thyroid Function Tests: No results for input(s): "TSH", "T4TOTAL", "FREET4", "T3FREE", "THYROIDAB" in the last 72 hours. Anemia Panel: No results for input(s): "VITAMINB12", "FOLATE", "FERRITIN", "TIBC", "IRON", "RETICCTPCT" in the last 72 hours. Urine analysis:    Component Value Date/Time   COLORURINE YELLOW 07/09/2021 1405   APPEARANCEUR CLEAR 07/09/2021 1405   LABSPEC 1.017 07/09/2021 1405   PHURINE 5.0 07/09/2021 1405   GLUCOSEU NEGATIVE 07/09/2021 1405   HGBUR NEGATIVE 07/09/2021 1405   BILIRUBINUR NEGATIVE 07/09/2021 1405   KETONESUR NEGATIVE 07/09/2021 1405   PROTEINUR NEGATIVE 07/09/2021 1405   UROBILINOGEN 0.2 11/12/2009 1000   NITRITE NEGATIVE 07/09/2021 1405   LEUKOCYTESUR NEGATIVE 07/09/2021 1405    Radiological Exams on Admission: No results found.    Assessment/Plan    Principal Problem:   Generalized weakness  ***      ***          ***           ***          ***          ***          ***          ***          ***          ***          ***          ***          ***          ***  DVT prophylaxis: SCD's ***  Code Status:  Full code*** Family Communication: none*** Disposition Plan: Per Rounding Team Consults called: none***;  Admission status: ***    I SPENT GREATER THAN 75 *** MINUTES IN CLINICAL CARE TIME/MEDICAL DECISION-MAKING IN COMPLETING THIS ADMISSION.     Chaney Born Jeny Nield DO [facility] From 7PM - 7AM   06/23/2022, 8:08 PM   ***

## 2022-06-23 NOTE — Progress Notes (Signed)
Pt came in for IVF today, fluids given, pt d/c to lobby ambulatory in stable condition, IV removed intact. No further needs at time of d/c.

## 2022-06-23 NOTE — Patient Instructions (Signed)
Dehydration, Adult Dehydration is a condition in which there is not enough water or other fluids in the body. This happens when a person loses more fluids than they take in. Important organs cannot work right without the right amount of fluids. Any loss of fluids from the body can cause dehydration. Dehydration can be mild, worse, or very bad. It should be treated right away to keep it from getting very bad. What are the causes? Conditions that cause loss of water in the body. They include: Watery poop (diarrhea). Vomiting. Sweating a lot. Fever. Infection. Peeing (urinating) a lot. Not drinking enough fluids. Certain medicines, such as medicines that take extra fluid out of the body (diuretics). Lack of safe drinking water. Not being able to get enough water and food. What increases the risk? Having a long-term (chronic) illness that has not been treated the right way, such as: Diabetes. Heart disease. Kidney disease. Being 65 years of age or older. Having a disability. Living in a place that is high above the ground or sea (high in altitude). The thinner, drier air causes more fluid loss. Doing exercises that put stress on your body for a long time. Being active when in hot places. What are the signs or symptoms? Symptoms of dehydration depend on how bad it is. Mild or worse dehydration Thirst. Dry lips or dry mouth. Feeling dizzy or light-headed. Muscle cramps. Passing little pee or dark pee. Pee may be the color of tea. Headache. Very bad dehydration Changes in skin. Skin may: Be cold to the touch (clammy). Be blotchy or pale. Not go back to normal right after you pinch it and let it go. Little or no tears, pee, or sweat. Fast breathing. Low blood pressure. Weak pulse. Pulse that is more than 100 beats a minute when you are sitting still. Other changes, such as: Feeling very thirsty. Eyes that look hollow (sunken). Cold hands and feet. Being confused. Being very  tired (lethargic) or having trouble waking from sleep. Losing weight. Loss of consciousness. How is this treated? Treatment for this condition depends on how bad your dehydration is. Treatment should start right away. Do not wait until your condition gets very bad. Very bad dehydration is an emergency. You will need to go to a hospital. Mild or worse dehydration can be treated at home. You may be asked to: Drink more fluids. Drink an oral rehydration solution (ORS). This drink gives you the right amount of fluids, salts, and minerals (electrolytes). Very bad dehydration can be treated: With fluids through an IV tube. By correcting low levels of electrolytes in the body. By treating the problem that caused your dehydration. Follow these instructions at home: Oral rehydration solution If told by your doctor, drink an ORS: Make an ORS. Use instructions on the package. Start by drinking small amounts, about  cup (120 mL) every 5-10 minutes. Slowly drink more until you have had the amount that your doctor said to have.  Eating and drinking  Drink enough clear fluid to keep your pee pale yellow. If you were told to drink an ORS, finish the ORS first. Then, start slowly drinking other clear fluids. Drink fluids such as: Water. Do not drink only water. Doing that can make the salt (sodium) level in your body get too low. Water from ice chips you suck on. Fruit juice that you have added water to (diluted). Low-calorie sports drinks. Eat foods that have the right amounts of salts and minerals, such as bananas, oranges, potatoes,   tomatoes, or spinach. Do not drink alcohol. Avoid drinks that have caffeine or sugar. These include:: High-calorie sports drinks. Fruit juice that you did not add water to. Soda. Coffee or energy drinks. Avoid foods that are greasy or have a lot of fat or sugar. General instructions Take over-the-counter and prescription medicines only as told by your doctor. Do  not take sodium tablets. Doing that can make the salt level in your body get too high. Return to your normal activities as told by your doctor. Ask your doctor what activities are safe for you. Keep all follow-up visits. Your doctor may check and change your treatment. Contact a doctor if: You have pain in your belly (abdomen) and the pain: Gets worse. Stays in one place. You have a rash. You have a stiff neck. You get angry or annoyed more easily than normal. You are more tired or have a harder time waking than normal. You feel weak or dizzy. You feel very thirsty. Get help right away if: You have any symptoms of very bad dehydration. You vomit every time you eat or drink. Your vomiting gets worse, does not go away, or you vomit blood or green stuff. You are getting treatment, but symptoms are getting worse. You have a fever. You have a very bad headache. You have: Diarrhea that gets worse or does not go away. Blood in your poop (stool). This may cause poop to look black and tarry. No pee in 6-8 hours. Only a small amount of pee in 6-8 hours, and the pee is very dark. You have trouble breathing. These symptoms may be an emergency. Get help right away. Call 911. Do not wait to see if the symptoms will go away. Do not drive yourself to the hospital. This information is not intended to replace advice given to you by your health care provider. Make sure you discuss any questions you have with your health care provider. Document Revised: 09/01/2021 Document Reviewed: 09/01/2021 Elsevier Patient Education  2023 [employer] Inc[employer]  

## 2022-06-24 ENCOUNTER — Other Ambulatory Visit (HOSPITAL_COMMUNITY): Payer: Self-pay

## 2022-06-24 ENCOUNTER — Observation Stay (HOSPITAL_COMMUNITY): Payer: Medicare HMO

## 2022-06-24 ENCOUNTER — Telehealth (HOSPITAL_COMMUNITY): Payer: Self-pay | Admitting: Pharmacy Technician

## 2022-06-24 ENCOUNTER — Encounter (HOSPITAL_COMMUNITY): Payer: Self-pay | Admitting: Internal Medicine

## 2022-06-24 DIAGNOSIS — E039 Hypothyroidism, unspecified: Secondary | ICD-10-CM | POA: Diagnosis not present

## 2022-06-24 DIAGNOSIS — Z8249 Family history of ischemic heart disease and other diseases of the circulatory system: Secondary | ICD-10-CM | POA: Diagnosis not present

## 2022-06-24 DIAGNOSIS — Z87891 Personal history of nicotine dependence: Secondary | ICD-10-CM | POA: Diagnosis not present

## 2022-06-24 DIAGNOSIS — R630 Anorexia: Secondary | ICD-10-CM | POA: Diagnosis not present

## 2022-06-24 DIAGNOSIS — E279 Disorder of adrenal gland, unspecified: Secondary | ICD-10-CM | POA: Diagnosis not present

## 2022-06-24 DIAGNOSIS — Z79890 Hormone replacement therapy (postmenopausal): Secondary | ICD-10-CM | POA: Diagnosis not present

## 2022-06-24 DIAGNOSIS — E871 Hypo-osmolality and hyponatremia: Secondary | ICD-10-CM | POA: Diagnosis present

## 2022-06-24 DIAGNOSIS — R4589 Other symptoms and signs involving emotional state: Secondary | ICD-10-CM | POA: Diagnosis not present

## 2022-06-24 DIAGNOSIS — C787 Secondary malignant neoplasm of liver and intrahepatic bile duct: Secondary | ICD-10-CM | POA: Diagnosis not present

## 2022-06-24 DIAGNOSIS — J329 Chronic sinusitis, unspecified: Secondary | ICD-10-CM | POA: Diagnosis not present

## 2022-06-24 DIAGNOSIS — G2581 Restless legs syndrome: Secondary | ICD-10-CM | POA: Diagnosis not present

## 2022-06-24 DIAGNOSIS — D638 Anemia in other chronic diseases classified elsewhere: Secondary | ICD-10-CM | POA: Diagnosis not present

## 2022-06-24 DIAGNOSIS — Z7189 Other specified counseling: Secondary | ICD-10-CM | POA: Diagnosis not present

## 2022-06-24 DIAGNOSIS — E861 Hypovolemia: Secondary | ICD-10-CM | POA: Diagnosis not present

## 2022-06-24 DIAGNOSIS — E860 Dehydration: Secondary | ICD-10-CM | POA: Diagnosis not present

## 2022-06-24 DIAGNOSIS — Z85528 Personal history of other malignant neoplasm of kidney: Secondary | ICD-10-CM | POA: Diagnosis not present

## 2022-06-24 DIAGNOSIS — C641 Malignant neoplasm of right kidney, except renal pelvis: Secondary | ICD-10-CM | POA: Diagnosis not present

## 2022-06-24 DIAGNOSIS — Z923 Personal history of irradiation: Secondary | ICD-10-CM | POA: Diagnosis not present

## 2022-06-24 DIAGNOSIS — E785 Hyperlipidemia, unspecified: Secondary | ICD-10-CM | POA: Diagnosis not present

## 2022-06-24 DIAGNOSIS — B4489 Other forms of aspergillosis: Secondary | ICD-10-CM | POA: Diagnosis not present

## 2022-06-24 DIAGNOSIS — J986 Disorders of diaphragm: Secondary | ICD-10-CM | POA: Diagnosis not present

## 2022-06-24 DIAGNOSIS — Y92009 Unspecified place in unspecified non-institutional (private) residence as the place of occurrence of the external cause: Secondary | ICD-10-CM | POA: Diagnosis not present

## 2022-06-24 DIAGNOSIS — R531 Weakness: Secondary | ICD-10-CM | POA: Diagnosis not present

## 2022-06-24 DIAGNOSIS — Z515 Encounter for palliative care: Secondary | ICD-10-CM

## 2022-06-24 DIAGNOSIS — E2740 Unspecified adrenocortical insufficiency: Secondary | ICD-10-CM | POA: Diagnosis not present

## 2022-06-24 DIAGNOSIS — Z79899 Other long term (current) drug therapy: Secondary | ICD-10-CM | POA: Diagnosis not present

## 2022-06-24 DIAGNOSIS — Z7901 Long term (current) use of anticoagulants: Secondary | ICD-10-CM | POA: Diagnosis not present

## 2022-06-24 DIAGNOSIS — Z7952 Long term (current) use of systemic steroids: Secondary | ICD-10-CM | POA: Diagnosis not present

## 2022-06-24 DIAGNOSIS — I129 Hypertensive chronic kidney disease with stage 1 through stage 4 chronic kidney disease, or unspecified chronic kidney disease: Secondary | ICD-10-CM | POA: Diagnosis not present

## 2022-06-24 DIAGNOSIS — R5381 Other malaise: Secondary | ICD-10-CM | POA: Diagnosis not present

## 2022-06-24 DIAGNOSIS — I2510 Atherosclerotic heart disease of native coronary artery without angina pectoris: Secondary | ICD-10-CM | POA: Diagnosis not present

## 2022-06-24 DIAGNOSIS — F419 Anxiety disorder, unspecified: Secondary | ICD-10-CM | POA: Diagnosis not present

## 2022-06-24 LAB — PROTIME-INR
INR: 0.9 (ref 0.8–1.2)
Prothrombin Time: 12.4 seconds (ref 11.4–15.2)

## 2022-06-24 LAB — CBC WITH DIFFERENTIAL/PLATELET
Abs Immature Granulocytes: 0.03 10*3/uL (ref 0.00–0.07)
Basophils Absolute: 0 10*3/uL (ref 0.0–0.1)
Basophils Relative: 0 %
Eosinophils Absolute: 0.2 10*3/uL (ref 0.0–0.5)
Eosinophils Relative: 3 %
HCT: 27.5 % — ABNORMAL LOW (ref 39.0–52.0)
Hemoglobin: 8.6 g/dL — ABNORMAL LOW (ref 13.0–17.0)
Immature Granulocytes: 0 %
Lymphocytes Relative: 25 %
Lymphs Abs: 1.8 10*3/uL (ref 0.7–4.0)
MCH: 27.4 pg (ref 26.0–34.0)
MCHC: 31.3 g/dL (ref 30.0–36.0)
MCV: 87.6 fL (ref 80.0–100.0)
Monocytes Absolute: 0.7 10*3/uL (ref 0.1–1.0)
Monocytes Relative: 10 %
Neutro Abs: 4.3 10*3/uL (ref 1.7–7.7)
Neutrophils Relative %: 62 %
Platelets: 374 10*3/uL (ref 150–400)
RBC: 3.14 MIL/uL — ABNORMAL LOW (ref 4.22–5.81)
RDW: 16.2 % — ABNORMAL HIGH (ref 11.5–15.5)
WBC: 7 10*3/uL (ref 4.0–10.5)
nRBC: 0 % (ref 0.0–0.2)

## 2022-06-24 LAB — BASIC METABOLIC PANEL
Anion gap: 7 (ref 5–15)
BUN: 11 mg/dL (ref 8–23)
CO2: 26 mmol/L (ref 22–32)
Calcium: 8.2 mg/dL — ABNORMAL LOW (ref 8.9–10.3)
Chloride: 93 mmol/L — ABNORMAL LOW (ref 98–111)
Creatinine, Ser: 0.83 mg/dL (ref 0.61–1.24)
GFR, Estimated: >60 mL/min (ref >60)
Glucose, Bld: 92 mg/dL (ref 70–99)
Potassium: 4.2 mmol/L (ref 3.5–5.1)
Sodium: 126 mmol/L — ABNORMAL LOW (ref 135–145)

## 2022-06-24 LAB — COMPREHENSIVE METABOLIC PANEL
ALT: 34 U/L (ref 0–44)
AST: 23 U/L (ref 15–41)
Albumin: 2.8 g/dL — ABNORMAL LOW (ref 3.5–5.0)
Alkaline Phosphatase: 99 U/L (ref 38–126)
Anion gap: 8 (ref 5–15)
BUN: 14 mg/dL (ref 8–23)
CO2: 24 mmol/L (ref 22–32)
Calcium: 8.1 mg/dL — ABNORMAL LOW (ref 8.9–10.3)
Chloride: 92 mmol/L — ABNORMAL LOW (ref 98–111)
Creatinine, Ser: 0.84 mg/dL (ref 0.61–1.24)
GFR, Estimated: >60 mL/min (ref >60)
Glucose, Bld: 106 mg/dL — ABNORMAL HIGH (ref 70–99)
Potassium: 4 mmol/L (ref 3.5–5.1)
Sodium: 124 mmol/L — ABNORMAL LOW (ref 135–145)
Total Bilirubin: 0.1 mg/dL — ABNORMAL LOW (ref 0.3–1.2)
Total Protein: 6.4 g/dL — ABNORMAL LOW (ref 6.5–8.1)

## 2022-06-24 LAB — TSH: TSH: 1.83 u[IU]/mL (ref 0.350–4.500)

## 2022-06-24 LAB — SODIUM: Sodium: 123 mmol/L — ABNORMAL LOW (ref 135–145)

## 2022-06-24 LAB — OSMOLALITY: Osmolality: 263 mOsm/kg — ABNORMAL LOW (ref 275–295)

## 2022-06-24 LAB — URIC ACID: Uric Acid, Serum: 3.5 mg/dL — ABNORMAL LOW (ref 3.7–8.6)

## 2022-06-24 LAB — MAGNESIUM: Magnesium: 1.8 mg/dL (ref 1.7–2.4)

## 2022-06-24 LAB — VITAMIN B12: Vitamin B-12: 372 pg/mL (ref 180–914)

## 2022-06-24 LAB — OSMOLALITY, URINE: Osmolality, Ur: 477 mOsm/kg (ref 300–900)

## 2022-06-24 MED ORDER — SODIUM CHLORIDE 1 G PO TABS
1.0000 g | ORAL_TABLET | Freq: Three times a day (TID) | ORAL | Status: DC
  Administered 2022-06-24 – 2022-06-26 (×7): 1 g via ORAL
  Filled 2022-06-24 (×8): qty 1

## 2022-06-24 MED ORDER — LEVOTHYROXINE SODIUM 75 MCG PO TABS
150.0000 ug | ORAL_TABLET | ORAL | Status: DC
  Administered 2022-06-26 – 2022-06-28 (×2): 150 ug via ORAL
  Filled 2022-06-24 (×2): qty 2

## 2022-06-24 MED ORDER — APIXABAN 2.5 MG PO TABS
2.5000 mg | ORAL_TABLET | Freq: Two times a day (BID) | ORAL | Status: DC
  Administered 2022-06-24 – 2022-06-28 (×8): 2.5 mg via ORAL
  Filled 2022-06-24 (×8): qty 1

## 2022-06-24 MED ORDER — HYDROCORTISONE 5 MG PO TABS
5.0000 mg | ORAL_TABLET | Freq: Every day | ORAL | Status: DC
  Administered 2022-06-24: 5 mg via ORAL
  Filled 2022-06-24: qty 1

## 2022-06-24 MED ORDER — LEVOTHYROXINE SODIUM 75 MCG PO TABS: 150.0000 ug | ORAL_TABLET | Freq: Every day | ORAL | Status: DC

## 2022-06-24 MED ORDER — LORAZEPAM 1 MG PO TABS
1.0000 mg | ORAL_TABLET | Freq: Four times a day (QID) | ORAL | Status: DC | PRN
  Administered 2022-06-24 – 2022-06-26 (×4): 1 mg via ORAL
  Filled 2022-06-24 (×4): qty 1

## 2022-06-24 MED ORDER — EZETIMIBE 10 MG PO TABS
10.0000 mg | ORAL_TABLET | ORAL | Status: DC
  Administered 2022-06-25: 10 mg via ORAL
  Filled 2022-06-24: qty 1

## 2022-06-24 MED ORDER — OXYCODONE-ACETAMINOPHEN 5-325 MG PO TABS: 1.0000 | ORAL_TABLET | Freq: Four times a day (QID) | ORAL | Status: DC | PRN

## 2022-06-24 MED ORDER — OLANZAPINE 2.5 MG PO TABS
2.5000 mg | ORAL_TABLET | Freq: Every day | ORAL | Status: DC
  Administered 2022-06-24: 2.5 mg via ORAL
  Filled 2022-06-24: qty 1

## 2022-06-24 MED ORDER — HYDROCORTISONE 10 MG PO TABS
10.0000 mg | ORAL_TABLET | Freq: Every morning | ORAL | Status: DC
  Administered 2022-06-25: 10 mg via ORAL
  Filled 2022-06-24: qty 1

## 2022-06-24 MED ORDER — POSACONAZOLE 100 MG PO TBEC
300.0000 mg | DELAYED_RELEASE_TABLET | Freq: Every day | ORAL | Status: DC
  Administered 2022-06-25 – 2022-06-28 (×4): 300 mg via ORAL
  Filled 2022-06-24 (×4): qty 3

## 2022-06-24 MED ORDER — MUPIROCIN 2 % EX OINT
1.0000 | TOPICAL_OINTMENT | Freq: Every day | CUTANEOUS | Status: DC
  Administered 2022-06-24 – 2022-06-28 (×5): 1 via TOPICAL
  Filled 2022-06-24: qty 22

## 2022-06-24 MED ORDER — SODIUM CHLORIDE 0.9 % IV SOLN: INTRAVENOUS | Status: DC

## 2022-06-24 MED ORDER — LEVOTHYROXINE SODIUM 50 MCG PO TABS
175.0000 ug | ORAL_TABLET | ORAL | Status: DC
  Administered 2022-06-25 – 2022-06-27 (×2): 175 ug via ORAL
  Filled 2022-06-24 (×2): qty 1

## 2022-06-24 MED ORDER — POSACONAZOLE 100 MG PO TBEC
300.0000 mg | DELAYED_RELEASE_TABLET | Freq: Two times a day (BID) | ORAL | Status: AC
  Administered 2022-06-24 (×2): 300 mg via ORAL
  Filled 2022-06-24 (×2): qty 3

## 2022-06-24 MED ORDER — DILTIAZEM HCL ER COATED BEADS 240 MG PO CP24
240.0000 mg | ORAL_CAPSULE | Freq: Every day | ORAL | Status: DC
  Administered 2022-06-24 – 2022-06-28 (×5): 240 mg via ORAL
  Filled 2022-06-24 (×5): qty 1

## 2022-06-24 NOTE — Plan of Care (Signed)
°  Problem: Education: °Goal: Knowledge of General Education information will improve °Description: Including pain rating scale, medication(s)/side effects and non-pharmacologic comfort measures °Outcome: Progressing °  °Problem: Clinical Measurements: °Goal: Diagnostic test results will improve °Outcome: Progressing °  °Problem: Clinical Measurements: °Goal: Ability to maintain clinical measurements within normal limits will improve °Outcome: Progressing °  °

## 2022-06-24 NOTE — Evaluation (Signed)
Physical Therapy Evaluation Patient Details Name: [name] MRN: [id number] DOB: 29-Jun-1945 Today's Date: 06/24/2022  History of Present Illness  77 y.o. male admitted to [facility] on 06/23/2022 as a direct admission from outpatient oncology office with generalized weakness after presenting from home to oncology clinic complaining of generalized weakness.Found to have acute hyponatremia. PHMx:metastatic clear-cell renal cancer, with mets to the liver, essential pretension, hyperlipidemia, acquired hypothyroidism, anemia of chronic disease associated baseline hemoglobin range 9-12.  Clinical Impression  On eval, pt was Min guard A for mobility. He walked ~150 feet around the unit. He tolerated activity well. Will continue to follow and progress activity as tolerated.        Recommendations for follow up therapy are one component of a multi-disciplinary discharge planning process, led by the attending physician.  Recommendations may be updated based on patient status, additional functional criteria and insurance authorization.  Follow Up Recommendations       Assistance Recommended at Discharge Intermittent Supervision/Assistance  Patient can return home with the following  Assist for transportation;Help with stairs or ramp for entrance;Assistance with cooking/housework    Equipment Recommendations None recommended by PT  Recommendations for Other Services       Functional Status Assessment Patient has had a recent decline in their functional status and demonstrates the ability to make significant improvements in function in a reasonable and predictable amount of time.     Precautions / Restrictions Precautions Precautions: Fall Restrictions Weight Bearing Restrictions: No      Mobility  Bed Mobility Overal bed mobility: Modified Independent                  Transfers Overall transfer level: Needs assistance   Transfers: Sit to/from Stand Sit to Stand:  Supervision                Ambulation/Gait Ambulation/Gait assistance: Min guard Gait Distance (Feet): 150 Feet Assistive device: IV Pole Gait Pattern/deviations: Step-through pattern, Decreased stride length       General Gait Details: Min guard for safety. No LOB. Managed IV pole well while ambulating. Denied dizziness.  Stairs            Wheelchair Mobility    Modified Rankin (Stroke Patients Only)       Balance Overall balance assessment: Mild deficits observed, not formally tested                                           Pertinent Vitals/Pain Pain Assessment Pain Assessment: No/denies pain    Home Living Family/patient expects to be discharged to:: Private residence Living Arrangements: Alone Available Help at Discharge: Family;Available PRN/intermittently Type of Home: House Home Access: Stairs to enter Entrance Stairs-Rails: Right Entrance Stairs-Number of Steps: 4   Home Layout: One level Home Equipment: Agricultural consultant (2 wheels);Cane - single point;Shower seat      Prior Function Prior Level of Function : Independent/Modified Independent             Mobility Comments: does not use an AD normally for ambulation       Hand Dominance   Dominant Hand: Right    Extremity/Trunk Assessment   Upper Extremity Assessment Upper Extremity Assessment: Defer to OT evaluation    Lower Extremity Assessment Lower Extremity Assessment: Generalized weakness    Cervical / Trunk Assessment Cervical / Trunk Assessment: Normal  Communication  Communication: HOH  Cognition Arousal/Alertness: Awake/alert Behavior During Therapy: WFL for tasks assessed/performed Overall Cognitive Status: Within Functional Limits for tasks assessed                                          General Comments      Exercises     Assessment/Plan    PT Assessment Patient needs continued PT services  PT Problem List  Decreased strength;Decreased mobility;Decreased activity tolerance;Decreased knowledge of use of DME       PT Treatment Interventions DME instruction;Gait training;Therapeutic exercise;Balance training;Therapeutic activities;Functional mobility training;Patient/family education    PT Goals (Current goals can be found in the Care Plan section)  Acute Rehab PT Goals Patient Stated Goal: feel bettert PT Goal Formulation: With patient/family Time For Goal Achievement: 07/08/22 Potential to Achieve Goals: Good    Frequency Min 1X/week     Co-evaluation               AM-PAC PT "6 Clicks" Mobility  Outcome Measure Help needed turning from your back to your side while in a flat bed without using bedrails?: None Help needed moving from lying on your back to sitting on the side of a flat bed without using bedrails?: None Help needed moving to and from a bed to a chair (including a wheelchair)?: None Help needed standing up from a chair using your arms (e.g., wheelchair or bedside chair)?: None Help needed to walk in hospital room?: A Little Help needed climbing 3-5 steps with a railing? : A Little 6 Click Score: 22    End of Session Equipment Utilized During Treatment: Gait belt Activity Tolerance: Patient tolerated treatment well Patient left: with call bell/phone within reach;with family/visitor present   PT Visit Diagnosis: Unsteadiness on feet (R26.81);Muscle weakness (generalized) (M62.81)    Time: 1914-7829 PT Time Calculation (min) (ACUTE ONLY): 12 min   Charges:   PT Evaluation $PT Eval Low Complexity: 1 Low             Faye Ramsay, PT Acute Rehabilitation  Office: [phone]

## 2022-06-24 NOTE — Plan of Care (Signed)
  Problem: Education: Goal: Knowledge of General Education information will improve Description: Including pain rating scale, medication(s)/side effects and non-pharmacologic comfort measures Outcome: Progressing   Problem: Clinical Measurements: Goal: Ability to maintain clinical measurements within normal limits will improve Outcome: Progressing   Problem: Nutrition: Goal: Adequate nutrition will be maintained Outcome: Progressing   Problem: Pain Managment: Goal: General experience of comfort will improve Outcome: Progressing   Problem: Safety: Goal: Ability to remain free from injury will improve Outcome: Progressing   

## 2022-06-24 NOTE — Evaluation (Signed)
Occupational Therapy Evaluation Patient Details Name: [name] MRN: [id number] DOB: [birth year] Today's Date: 06/24/2022   History of Present Illness [name] is a 77 y.o. male admitted to [facility] on 06/23/2022 as a direct admission from outpatient oncology office with generalized weakness after presenting from home to oncology clinic complaining of generalized weakness.Found to have acute hyponatremia. PHMx:metastatic clear-cell renal cancer, with mets to the liver, essential pretension, hyperlipidemia, acquired hypothyroidism, anemia of chronic disease associated baseline hemoglobin range 9-12.   Clinical Impression   This 77 yo male admitted with above presents to acute OT with PLOF of being totally independent with basic ADLs, IADLs, and driving. He currently is at a setup-min guard A level for basic ADLs in his hospital room and min guard A for ambulation in room and hallway without AD. He will continue to benefit from acute OT without need for follow up.      Recommendations for follow up therapy are one component of a multi-disciplinary discharge planning process, led by the attending physician.  Recommendations may be updated based on patient status, additional functional criteria and insurance authorization.   Assistance Recommended at Discharge PRN  Patient can return home with the following Assistance with cooking/housework;Help with stairs or ramp for entrance;Assist for transportation    Functional Status Assessment  Patient has had a recent decline in their functional status and demonstrates the ability to make significant improvements in function in a reasonable and predictable amount of time.  Equipment Recommendations  None recommended by OT       Precautions / Restrictions Precautions Precautions: Fall Restrictions Weight Bearing Restrictions: No      Mobility Bed Mobility               General bed mobility comments: Pt up and sitting on window  seat upon arrival    Transfers Overall transfer level: Needs assistance Equipment used: None Transfers: Sit to/from Stand Sit to Stand: Supervision           General transfer comment: ambulation min guard A due to a bit unsteady on his feet at times with turns and head movements while ambulating      Balance Overall balance assessment: Mild deficits observed, not formally tested                                         ADL either performed or assessed with clinical judgement   ADL Overall ADL's : Needs assistance/impaired Eating/Feeding: Independent;Sitting   Grooming: Set up;Supervision/safety;Standing   Upper Body Bathing: Set up;Sitting   Lower Body Bathing: Supervison/ safety;Set up;Sit to/from stand   Upper Body Dressing : Set up;Sitting   Lower Body Dressing: Set up;Supervision/safety;Sit to/from stand   Toilet Transfer: Min guard;Ambulation;Regular Teacher, adult education Details (indicate cue type and reason): no AD Toileting- Clothing Manipulation and Hygiene: Supervision/safety;Sit to/from stand   Tub/ Shower Transfer: Supervision/safety;Ambulation;Walk-in Copywriter, advertising Details (indicate cue type and reason): held onto wall as he stepped into shower stall and out         Vision Baseline Vision/History: 1 Wears glasses Ability to See in Adequate Light: 0 Adequate Patient Visual Report: No change from baseline              Pertinent Vitals/Pain Pain Assessment Pain Assessment: No/denies pain     Hand Dominance Right   Extremity/Trunk Assessment Upper Extremity Assessment Upper Extremity Assessment: Overall  WFL for tasks assessed           Communication Communication Communication: HOH   Cognition Arousal/Alertness: Awake/alert Behavior During Therapy: WFL for tasks assessed/performed Overall Cognitive Status: Within Functional Limits for tasks assessed                                                   Home Living Family/patient expects to be discharged to:: Private residence Living Arrangements: Alone Available Help at Discharge: Family;Available PRN/intermittently Type of Home: House Home Access: Stairs to enter [employer] of Steps: 4 Entrance Stairs-Rails: Right Home Layout: One level     Bathroom Shower/Tub: Walk-in shower;Door   [employer] Toilet: Standard     Home Equipment: Agricultural consultant (2 wheels);Cane - single point;Shower seat   Additional Comments: eats out alot      Prior Functioning/Environment Prior Level of Function : Independent/Modified Independent             Mobility Comments: does not use an AD normally for ambulation          OT Problem List: Impaired balance (sitting and/or standing);Decreased activity tolerance (says he still feels weak)      OT Treatment/Interventions: Self-care/ADL training;DME and/or AE instruction;Patient/family education;Balance training;Energy conservation    OT Goals(Current goals can be found in the care plan section) Acute Rehab OT Goals Patient Stated Goal: to feel better and go home OT Goal Formulation: With patient Time For Goal Achievement: 07/08/22 Potential to Achieve Goals: Good  OT Frequency: Min 1X/week       AM-PAC OT "6 Clicks" Daily Activity     Outcome Measure Help from another person eating meals?: None Help from another person taking care of personal grooming?: A Little Help from another person toileting, which includes using toliet, bedpan, or urinal?: A Little Help from another person bathing (including washing, rinsing, drying)?: A Little Help from another person to put on and taking off regular upper body clothing?: A Little Help from another person to put on and taking off regular lower body clothing?: A Little 6 Click Score: 19   End of Session Equipment Utilized During Treatment: Gait belt Nurse Communication: Mobility status (pt would like his bed linesn put  back on so he can get back in bed after he eats breakfast.)  Activity Tolerance: Patient tolerated treatment well Patient left: in chair;with call bell/phone within reach;with chair alarm set  OT Visit Diagnosis: Unsteadiness on feet (R26.81);Muscle weakness (generalized) (M62.81)                Time: 7829-5621 OT Time Calculation (min): 22 min Charges:  OT General Charges $OT Visit: 1 Visit OT Evaluation $OT Eval Moderate Complexity: 1 Mod  Cathy L. OT Acute Rehabilitation Services Office [phone]    Evette Georges 06/24/2022, 9:56 AM

## 2022-06-24 NOTE — Consult Note (Signed)
[facility]                                                                                        Patient Identification: Patient Name: [name] MRN: [id number] Admit Date: 06/23/2022  5:57 PM Today's Date: 06/24/2022 Reason for consult: Fungal sinusitis Requesting provider: Dr. [place]   Principal Problem:   Generalized weakness Active Problems:   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   Hyperlipidemia   Essential hypertension   Acute hyponatremia   Acquired hypothyroidism   Anemia of chronic disease   Antibiotics:  Not taking Voriconazole for over a week since 5/3  Lines/Hardware:  Assessment 77 year old male with PMH as below including metastatic clear-cell renal carcinoma with liver metastasis status post rt nephrectomy in December 2016, s/p omental biopsy July 2019 s/p immunotherapy with Ipilizumab and nivolumab  stopped due to adrenal insufficiency and panhypopituitarism, s/p radiotherapy completed sept 2021 with chemotherapy started with Cabometyx March 2023 but stopped in the setting of Aspergillus sinusitis ( follows Dr Arbutus Ped) CAD, lung cancer status post lobectomy 1992,  HLD, HTN, hypothyroidism as well as recently started on voriconazole for 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 Found to have   # Hyponatremia - likley hypovolemic in the setting of poor oral intake and dehydration, Very unlikely 2/2 Voriconazole ( a rare side effect ) but timeline is suggestive of so. 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 No reported h/o DM, on steroids for temporal arteritis early this year  Recommendations  Will start Posaconazole for now in the setting of hyponatremia and intolerance to Voriconazole which is the preferred antifungals. Isavuconazole not covered by his insurance per d/w ID Pharm D  Management of hyponatremia per primary  Fu CT maxillofacial in June 3 noted, do not think repeat imaging is needed currently from fungal sinusitis standpoint unless new symptoms  Will check for HIV ag/ab as well as aspergillus ag in serum   Rest of the management as per the primary team. Please call with questions or concerns.  Thank you for the consult  __________________________________________________________________________________________________________ HPI and Hospital Course: 77 year old male with PMH as below including metastatic clear-cell renal carcinoma with liver metastasis status post rt nephrectomy in December 2016, s/p omental biopsy July 2019 s/p immunotherapy with Ipilizumab and nivolumab  stopped due to adrenal insufficiency and panhypopituitarism, s/p radiotherapy completed sept 2021 with chemotherapy started with Cabometyx March 2023 but stopped in the setting of Aspergillus sinusitis ( follows Dr Arbutus Ped) CAD, lung cancer status post lobectomy 1992,  HLD, HTN, hypothyroidism as well as recently started on voriconazole for 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  Patient reported 1 week of progressive  generalized weakness with hyponatremia.  Denies any fever, chills or rigors or myalgia.  Denies any chest pain, shortness of breath or palpitations.  Denies any GU symptoms.  Started taking Voriconazole after visit with Dr Earlene Plater on 4/24 and stopped it after a week since he did not feel good overall. Reports he was feeling OK prior to start of Voriconazole.  Denies  headache, vision changes, hearing or balance issues. Denies any nasal discharge, feels pain at the left cheek area,  Denies nausea, vomiting, abdominal pain and diarrhea  Denies GU symptoms   He has been afebrile so far Labs remarkable for Na 124, albumin 2.8, WBC 7 Imagings reviewed  Planned to get repeat CT maxillofacial area on 6/3  ROS: General- Denies fever, chills, loss of appetite and loss of weight HEENT - Denies headache, blurry vision, neck pain, sinus pain Chest - Denies any chest pain, SOB or cough CVS- Denies any dizziness/lightheadedness, syncopal attacks, palpitations Abdomen- Denies any nausea, vomiting, abdominal pain, hematochezia and diarrhea Neuro - Denies any weakness, numbness, tingling sensation Psych - Denies any changes in mood irritability or depressive symptoms GU- Denies any burning, dysuria, hematuria or increased frequency of urination Skin - denies any rashes/lesions MSK - denies any joint pain/swelling or restricted ROM   Past Medical History:  Diagnosis Date   Arthritis    Chronic kidney disease    only has one kidney    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 02/01/2015   Coronary artery disease    followed by Dr.Tilley   Frequent PVCs    GERD (gastroesophageal reflux disease)    Heart murmur    never has caused any problems   Hx of cancer of lung 1980's   Hyperlipidemia    Hypertension    Hypothyroidism    Incisional hernia 08/01/2015   Lung cancer (HCC) 1993   Lung metastases 2019   Pneumonia    x several   Recurrent umbilical hernia 08/01/2015   Past Surgical History:  Procedure Laterality Date   APPENDECTOMY  age 35   ARTERY BIOPSY Left 04/10/2022   Procedure: LEFT TEMPORAL ARTERY BIOPSY;  Surgeon: Chuck Hint, MD;  Location[facility];  Service: Vascular;  Laterality: Left;   CHOLECYSTECTOMY  10/18/1978   EYE SURGERY Bilateral 2019   cataract   LAPAROSCOPIC LYSIS OF ADHESIONS N/A 08/01/2015    Procedure: LAPAROSCOPIC LYSIS OF ADHESIONS;  Surgeon: Karie Soda, MD;  Location: [facility] ORS;  Service: General;  Laterality: N/A;   LUNG LOBECTOMY  02/16/1990   lung cancer- patient has staples in lung not to have MRI per patient    MAXILLARY ANTROSTOMY Left 05/22/2022   Procedure: MAXILLARY ANTROSTOMY;  Surgeon: Laren Boom, DO;  Location: [facility] OR;  Service: ENT;  Laterality: Left;   PILONIDAL CYST EXCISION  02/16/2009   Dr Gerrit Friends   ROBOTIC ASSITED PARTIAL NEPHRECTOMY Right 02/01/2015   Procedure: RIGHT ROBOTIC ASSISTED LAPAROSOCOPY NEPHRECTOMY;  Surgeon: Crist Fat, MD;  Location: [facility] ORS;  Service: Urology;  Laterality: Right;   SINUS ENDO WITH FUSION Left 05/22/2022   Procedure: SINUS ENDO WITH FUSION;  Surgeon: Laren Boom, DO;  Location: [facility] OR;  Service: ENT;  Laterality: Left;   VENTRAL HERNIA REPAIR N/A 08/01/2015   Procedure: LAPAROSCOPIC VENTRAL WALL HERNIA WITH MESH;  Surgeon: Karie Soda, MD;  Location: [facility] ORS;  Service: General;  Laterality: N/A;     Scheduled Meds:  diltiazem  240 mg Oral Daily   [START ON  06/25/2022] ezetimibe  10 mg Oral Once per day on Mon Thu   levothyroxine  150 mcg Oral Q0600   Continuous Infusions:  sodium chloride     PRN Meds:.acetaminophen **OR** acetaminophen, melatonin, ondansetron (ZOFRAN) IV, oxyCODONE-acetaminophen  Allergies  Allergen Reactions   Diphenhydramine Other (See Comments)    Severe restless legs   Social History   Socioeconomic History   Marital status: Widowed    Spouse name: Not on file   Number of children: 2   Years of education: Not on file   Highest education level: Not on file  Occupational History   Occupation: Retired  Tobacco Use   Smoking status: Former    Packs/day: 1.00    Years: 20.00    Additional pack years: 0.00    Total pack years: 20.00    Types: Cigarettes    Quit date: 01/29/1988    Years since quitting: 34.4   Smokeless tobacco: Never  Vaping Use   Vaping Use: Never used   Substance and Sexual Activity   Alcohol use: Not Currently    Comment: rare   Drug use: No   Sexual activity: Not Currently  Other Topics Concern   Not on file  Social History Narrative   Not on file   Social Determinants of Health   Financial Resource Strain: Not on file  Food Insecurity: No Food Insecurity (06/23/2022)   Hunger Vital Sign    Worried About Running Out of Food in the Last Year: Never true    Ran Out of Food in the Last Year: Never true  Transportation Needs: No Transportation Needs (06/23/2022)   PRAPARE - Administrator, Civil Service (Medical): No    Lack of Transportation (Non-Medical): No  Physical Activity: Not on file  Stress: Not on file  Social Connections: Not on file  Intimate Partner Violence: Not At Risk (06/23/2022)   Humiliation, Afraid, Rape, and Kick questionnaire    Fear of Current or Ex-Partner: No    Emotionally Abused: No    Physically Abused: No    Sexually Abused: No   Family History  Problem Relation Age of Onset   Heart attack Father    Vitals BP (!) 158/74 (BP Location: Left Arm)   Pulse 74   Temp 98.2 F (36.8 C) (Oral)   Resp 18   Ht 5\' 8"  (1.727 m)   Wt 74.9 kg   SpO2 97%   BMI 25.11 kg/m   Physical Exam Constitutional:  elderly male lying in the bed and appears weak and tired     Comments:   Cardiovascular:     Rate and Rhythm: Normal rate and regular rhythm.     Heart sounds: s1s2  Pulmonary:     Effort: Pulmonary effort is normal on room air     Comments: Normal breath sounds   Abdominal:     Palpations: Abdomen is soft.     Tenderness: non distended and non tender   Musculoskeletal:        General: No swelling or tenderness in peripheral joints   Skin:    Comments: No rashes   Neurological:     General: awake, alert, oriented, following commands, grossly non focal   Psychiatric:        Mood and Affect: Mood normal.    Pertinent Microbiology Results for orders placed or performed during  the hospital encounter of 05/22/22  Aerobic/Anaerobic Culture w Gram Stain (surgical/deep wound)     Status: None  Collection Time: 05/22/22  8:40 AM   Specimen: PATH Other; Body Fluid  Result Value Ref Range Status   Specimen Description MAXILLARY SINUS  Final   Special Requests NONE  Final   Gram Stain   Final    FEW WBC PRESENT, PREDOMINANTLY PMN NO ORGANISMS SEEN Performed at [facility], [address], [place], [place] [address]    Culture   Final    MOLD SEE SEPARATE REPORT NO ANAEROBES ISOLATED; CULTURE IN PROGRESS FOR 5 DAYS RARE PROPIONIBACTERIUM ACNES    Report Status 05/28/2022 FINAL  Final  Organism Identification, Mold     Status: Abnormal   Collection Time: 05/22/22  8:40 AM  Result Value Ref Range Status   Organism Identification, Mold Final report (A)  Final    Comment: (NOTE) Performed At: [facility] [address] [place], [place] [id number] Jolene Schimke MD [id number]    Source of Sample MAXILLARY SINUS  Final    Comment: Performed at [facility], [address], [place], [place] [address]  Mold organism reflex     Status: Abnormal   Collection Time: 05/22/22  8:40 AM  Result Value Ref Range Status   Mold Result - 1 Aspergillus fumigatus (A)  Final    Comment: (NOTE) Performed At: [facility] [address] [place], [place] [id number] Jolene Schimke MD [id number]   Aerobic/Anaerobic Culture w Gram Stain (surgical/deep wound)     Status: None   Collection Time: 05/22/22  8:41 AM   Specimen: PATH Other; Tissue  Result Value Ref Range Status   Specimen Description TISSUE MAXILLARY SINUS  Final   Special Requests NONE  Final   Gram Stain RARE WBC SEEN NO ORGANISMS SEEN   Final   Culture   Final    No growth aerobically or anaerobically. Performed at [facility], [address] [place], [place] [address]    Report Status 05/27/2022 FINAL  Final   Pertinent Lab seen by me:    Latest Ref Rng & Units  06/24/2022    1:21 AM 06/23/2022   12:06 PM 06/11/2022    7:55 AM  CBC  WBC 4.0 - 10.5 K/uL 7.0  7.3  9.9   Hemoglobin 13.0 - 17.0 g/dL 8.6  9.5  9.7   Hematocrit 39.0 - 52.0 % 27.5  29.1  31.0   Platelets 150 - 400 K/uL 374  490  338       Latest Ref Rng & Units 06/24/2022    1:21 AM 06/23/2022   12:06 PM 06/11/2022    7:55 AM  CMP  Glucose 70 - 99 mg/dL 644  034  742   BUN 8 - 23 mg/dL 14  15  23    Creatinine 0.61 - 1.24 mg/dL 5.95  6.38  7.56   Sodium 135 - 145 mmol/L 124  124  141   Potassium 3.5 - 5.1 mmol/L 4.0  4.1  3.9   Chloride 98 - 111 mmol/L 92  89  103   CO2 22 - 32 mmol/L 24  26  29    Calcium 8.9 - 10.3 mg/dL 8.1  8.5  9.2   Total Protein 6.5 - 8.1 g/dL 6.4  6.7  6.8   Total Bilirubin 0.3 - 1.2 mg/dL 0.1  0.2  0.3   Alkaline Phos 38 - 126 U/L 99  120  67   AST 15 - 41 U/L 23  26  12    ALT 0 - 44  U/L 34  36  14     Pertinent Imagings/Other Imagings Plain films and CT images have been personally visualized and interpreted; radiology reports have been reviewed. Decision making incorporated into the Impression / Recommendations.  No results found.  I have personally spent 85 minutes involved in face-to-face and non-face-to-face activities for this patient on the day of the visit. Professional time spent includes the following activities: Preparing to see the patient (review of tests), Obtaining and/or reviewing separately obtained history (admission/discharge record), Performing a medically appropriate examination and/or evaluation , Ordering medications/tests/procedures, referring and communicating with other health care professionals, Documenting clinical information in the EMR, Independently interpreting results (not separately reported), Communicating results to the patient/family/caregiver, Counseling and educating the patient/family/caregiver and Care coordination (not separately reported).  Electronically signed by:   Plan d/w requesting provider as well as ID pharm  D  Note: This document was prepared using dragon voice recognition software and may include unintentional dictation errors.   Odette Fraction, MD Infectious Disease Physician [facility] Pager: [phone]

## 2022-06-24 NOTE — Progress Notes (Signed)
PROGRESS NOTE    [name]  ZOX:[id number] DOB: January 29, 1946 DOA: 06/23/2022 PCP: [name], MD    Brief Narrative:   [name] is a 77 y.o. male with past medical history significant for clear-cell renal cell carcinoma with metastasis to liver, HTN, HLD, hypothyroidism, adrenal insufficiency, anemia of chronic medical disease, and recent diagnosis of invasive fungal sinusitis with aspergillosis who presented as a direct admission from the cancer center to [facility] for progressive weakness and fatigue in the setting of hyponatremia.  Patient reports 1 week progressive generalized weakness without associated acute focal complaints.  Patient follows with medical oncology outpatient, Dr. Shirline Frees.  Has been unable to tolerate his chemotherapy for greater than 2 months now as well as dealing with new invasive fungal infection.  Patient was found to have a sodium level of 124 at the oncology clinic.  He denies fever, no chills, no myalgias, no chest pain, no shortness of breath, no palpitations.  On arrival to [facility], temperature 98.1 F, HR 81, RR 16, BP 155/76, SpO2 99% on room air.  WBC 7.0, hemoglobin 8.6, platelets 374.  Sodium 124, potassium 4.0, chloride 92, CO2 24, glucose 106, BUN 14, creatinine 0.84.  AST 23, ALT 34, total Ruben 0.1.  Urinalysis unrevealing.  Urine sodium 137, urine osmolality 477.  Chest x-ray with no acute cardiopulmonary abnormalities.  Assessment & Plan:   Hyponatremia Patient with reported generalized weakness, fatigue over the last week.  Recent labs at oncology clinic with sodium of 124.  Patient admits to poor oral intake.  Recent sodium 141 on 06/11/2022.  Etiology likely secondary to hypovolemic hyponatremia in the setting of poor oral intake.  Urine sodium elevated 137, urine osmolality 477.  TSH and uric acid level unrevealing. -- Na 124>126 -- Increase NS to 117mL/h -- Encourage increased oral intake  Right clear renal cell  carcinoma with liver metastasis Follows with urology, Dr. Marlou Porch and medical oncology Dr. Arbutus Ped outpatient.  Underwent radical right renal nephrectomy February 01, 2015 and omental biopsy July 2019.  Treated initially with immunotherapy that was discontinued due to adrenal insufficiency and panhypopituitarism.  Completed radiation to abdominal lymph nodes in 2021.  Currently on chemotherapy with Cabometyx; but has been off for roughly 2 months per oncology.  Invasive fungal sinusitis Follows with infectious disease, Dr. Earlene Plater and ENT, Dr. Darl Pikes outpatient.  Recent biopsy with aspergillosis fumigatus.  Complicated by long-term steroid use. -- Posaconazole 300 mg p.o. twice daily  Hypothyroidism TSH 1.830, within normal limits.  Recently seen by endocrinology outpatient. -- Levothyroxine 150 mcg and 175 mcg PO every other day  Adrenal insufficiency Follows with endocrinology outpatient. -- Hydrocortisone 10 mg p.o. qAm and 5mg  PO qPM -- cortisol level in the am  Essential hypertension -- Cardizem 240 mg p.o. daily  Anemia of chronic medical disease Hemoglobin 8.6, stable.   DVT prophylaxis: SCDs Start: 06/23/22 1912    Code Status: Full Code Family Communication: Present bedside this morning  Disposition Plan:  Level of care: Med-Surg Status is: Inpatient Remains inpatient appropriate because: IV fluid hydration    Consultants:  Palliative care  Procedures:  None  Antimicrobials:  posaconazole   Subjective: Patient seen examined bedside, resting comfortably.  Sitting in bedside chair eating breakfast.  Continues with generalized weakness, fatigue.  Denies any acute localization of symptoms.  Continues with poor oral intake.  No other specific questions or concerns at this time.  Increasing IV fluid rate this morning.  Discussed with RN.  Denies headache, no dizziness, no chest pain, no shortness of breath, no abdominal pain, no focal weakness, no cough/congestion,  no fever/chills/night sweats, no nausea/vomiting/diarrhea, no paresthesias.  No acute events overnight per nursing staff.  Objective: Vitals:   06/23/22 2112 06/24/22 0117 06/24/22 0513 06/24/22 1343  BP: (!) 141/81 135/68 (!) 158/74 139/72  Pulse: 79 74 74 72  Resp: 18 18 18 16   Temp: 99.3 F (37.4 C) 98.9 F (37.2 C) 98.2 F (36.8 C) 98.3 F (36.8 C)  TempSrc: Oral Oral Oral Oral  SpO2: 98% 98% 97% 98%  Weight:      Height:        Intake/Output Summary (Last 24 hours) at 06/24/2022 1444 Last data filed at 06/24/2022 1400 Gross per 24 hour  Intake 2044.28 ml  Output 750 ml  Net 1294.28 ml   Filed Weights   06/23/22 2001  Weight: 74.9 kg    Examination:  Physical Exam: GEN: NAD, alert and oriented x 3, chronically ill/elderly in appearance HEENT: NCAT, PERRL, EOMI, sclera clear, MMM PULM: CTAB w/o wheezes/crackles, normal respiratory effort, on room air CV: RRR w/o M/G/R GI: abd soft, NTND, NABS, no R/G/M MSK: no peripheral edema, muscle strength globally intact 5/5 bilateral upper/lower extremities NEURO: CN II-XII intact, no focal deficits, sensation to light touch intact PSYCH: normal mood/affect Integumentary: dry/intact, no rashes or wounds    Data Reviewed: I have personally reviewed following labs and imaging studies  CBC: Recent Labs  Lab 06/23/22 1206 06/24/22 0121  WBC 7.3 7.0  NEUTROABS 5.1 4.3  HGB 9.5* 8.6*  HCT 29.1* 27.5*  MCV 84.6 87.6  PLT 490* 374   Basic Metabolic Panel: Recent Labs  Lab 06/23/22 1206 06/24/22 0121 06/24/22 0845  NA 124* 124* 126*  K 4.1 4.0 4.2  CL 89* 92* 93*  CO2 26 24 26   GLUCOSE 139* 106* 92  BUN 15 14 11   CREATININE 0.94 0.84 0.83  CALCIUM 8.5* 8.1* 8.2*  MG  --  1.8  --    GFR: Estimated Creatinine Clearance: 69.8 mL/min (by C-G formula based on SCr of 0.83 mg/dL). Liver Function Tests: Recent Labs  Lab 06/23/22 1206 06/24/22 0121  AST 26 23  ALT 36 34  ALKPHOS 120 99  BILITOT 0.2* 0.1*  PROT  6.7 6.4*  ALBUMIN 2.9* 2.8*   No results for input(s): "LIPASE", "AMYLASE" in the last 168 hours. No results for input(s): "AMMONIA" in the last 168 hours. Coagulation Profile: Recent Labs  Lab 06/24/22 0649  INR 0.9   Cardiac Enzymes: No results for input(s): "CKTOTAL", "CKMB", "CKMBINDEX", "TROPONINI" in the last 168 hours. BNP (last 3 results) No results for input(s): "PROBNP" in the last 8760 hours. HbA1C: No results for input(s): "HGBA1C" in the last 72 hours. CBG: No results for input(s): "GLUCAP" in the last 168 hours. Lipid Profile: No results for input(s): "CHOL", "HDL", "LDLCALC", "TRIG", "CHOLHDL", "LDLDIRECT" in the last 72 hours. Thyroid Function Tests: Recent Labs    06/24/22 0121  TSH 1.830   Anemia Panel: Recent Labs    06/24/22 0649  VITAMINB12 372   Sepsis Labs: No results for input(s): "PROCALCITON", "LATICACIDVEN" in the last 168 hours.  No results found for this or any previous visit (from the past 240 hour(s)).       Radiology Studies: DG Chest Port 1 View  Result Date: 06/24/2022 CLINICAL DATA:  Acute hyponatremia. EXAM: PORTABLE CHEST 1 VIEW COMPARISON:  CT chest 05/03/2022 FINDINGS: Stable cardiomediastinal contours. Status post right middle  and right lower lobectomies. Asymmetric right lung volume loss is noted with elevation of the right hemidiaphragm. Surgical clips within the mediastinum and right infrahilar region are again seen. No superimposed pleural effusion, airspace consolidation or interstitial edema. IMPRESSION: 1. No acute cardiopulmonary abnormalities. 2. Status post right middle and right lower lobectomies. Electronically Signed   By: Signa Kell M.D.   On: 06/24/2022 11:26        Scheduled Meds:  diltiazem  240 mg Oral Daily   [START ON 06/25/2022] ezetimibe  10 mg Oral Once per day on Mon Thu   [START ON 06/25/2022] hydrocortisone  10 mg Oral q morning   hydrocortisone  5 mg Oral QHS   levothyroxine  150 mcg Oral Q0600    mupirocin ointment  1 Application Topical Daily   OLANZapine  2.5 mg Oral QHS   posaconazole  300 mg Oral BID WC   Followed by   Melene Muller ON 06/25/2022] posaconazole  300 mg Oral Q breakfast   Continuous Infusions:  sodium chloride 100 mL/hr at 06/24/22 0817     LOS: 0 days    Time spent: 53 minutes spent on chart review, discussion with nursing staff, consultants, updating family and interview/physical exam; more than 50% of that time was spent in counseling and/or coordination of care.    Alvira Philips [place], DO [facility] Available via Epic secure chat 7am-7pm After these hours, please refer to coverage provider listed on [email or web address] 06/24/2022, 2:44 PM

## 2022-06-24 NOTE — Telephone Encounter (Signed)
Patient Advocate Encounter  Prior Authorization for Posaconazole 100MG  dr tablets  has been approved.    PA# [id number] Insurance [employer] Medicare Electronic PA Form Effective dates: 06/24/2022 through 12/21/2022  Patients co-pay is $0.00.     Roland Earl, CPhT Pharmacy Patient Advocate Specialist [facility] Patient Advocate Team Direct Number: [phone]  Fax: [phone]

## 2022-06-24 NOTE — Telephone Encounter (Signed)
Pharmacy Patient Advocate Encounter  Insurance verification completed.    The patient is insured through [employer] Part D   The patient is currently admitted and ran test claims for the following: Cresemba, posaconazole.  Copays and coinsurance results were relayed to Inpatient clinical team.

## 2022-06-24 NOTE — Consult Note (Signed)
Consultation Note Date: 06/24/2022   Patient Name: [name]  DOB: 12/09/1945  MRN: [id number]  Age / Sex: 77 y.o., male   PCP: [name], MD Referring Physician: [place], Eric J, DO  Reason for Consultation: Establishing goals of care     Chief Complaint/History of Present Illness:   Patient is a 77 year old male with a past medical history of clear-cell renal cell carcinoma with metastatic disease to liver, hypertension, hyperlipidemia, hypothyroidism, adrenal insufficiency, anemia of chronic medical disease, and recent diagnosis of invasive fungal sinusitis with aspergillosis who was admitted on 06/23/2022 as a direct admission from cancer center for progressive weakness and fatigue in the setting of hyponatremia.  Since admission, patient has received medical management for hyponatremia as well as continued antibiotic management for invasive fungal sinusitis.  Out of medicine team consulted to assist with complex medical decision making. Of note patient seen by outpatient palliative medicine provider at [facility].  Extensive review of EMR prior to presenting to bedside.  Discussed care with bedside RN prior to seeing patient.  When presenting to bedside, patient lying comfortably in bed.  Patient's daughter at bedside.  Introduced myself and the role of the palliative medicine team in patient's care.  When inquiring how patient was doing at this time, patient noted that he is "horrible".  Patient notes that he does not feel well and has not felt well for a while.  Patient was very tired during conversation so history was obtained from patient and daughter at bedside.  Daughter described patient's worsening weakness especially over the past 1-2 weeks.  Daughter also noted patient has had worsening oral intake.  Patient notes that since he started cancer directed therapies, he has not had a great appetite.  Inquired for more details regarding poor appetite.  Patient notes he initially feels  hungry though when he puts food in his mouth, he just feels it does not taste good and he does not want to swallow.  Patient denies any difficulties with the function of swallowing.  Did discuss how medications from cancer directed therapies could affect one's taste.  Inquired if patient had been on any appetite stimulants and he noted he has not.  Discussed initiation of olanzapine at bedtime to assist with not only appetite management but also mood and sleep management as well.  Patient has not been sleeping well and daughter notes worsening anxiety.  Patient and daughter agreeing with starting low-dose olanzapine tonight.  Also able to discuss patient's overall medical condition.  Daughter provided great details regarding patient's sinusitis diagnosis and regarding patient's cancer therapies.  Discussed how medication affects can also play into patient not feeling well.  Also reviewed that patient has a history of adrenal insufficiency.  Noted would reach out to providers to discuss if stress dose steroid is needed.  Also explained to patient and daughter concerned about patient needing steroids if he does because he has this fungal sinusitis.  Noted difficult balance.  All questions answered at that time.  Provided emotional support via active listening.  Noted palliative medicine team would continue to follow along with patient's medical journey.  Updated care team regarding conversation.  Primary Diagnoses  Present on Admission:  Acute hyponatremia  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  Essential hypertension  Hyperlipidemia  Acquired hypothyroidism  Anemia of chronic disease   Palliative Review of Systems: Poor appetite, fatigue  Past Medical History:  Diagnosis Date   Arthritis    Chronic kidney disease  only has one kidney    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 02/01/2015   Coronary artery disease    followed by Dr.Tilley    Frequent PVCs    GERD (gastroesophageal reflux disease)    Heart murmur    never has caused any problems   Hx of cancer of lung 1980's   Hyperlipidemia    Hypertension    Hypothyroidism    Incisional hernia 08/01/2015   Lung cancer (HCC) 1993   Lung metastases 2019   Pneumonia    x several   Recurrent umbilical hernia 08/01/2015   Social History   Socioeconomic History   Marital status: Widowed    Spouse name: Not on file   Number of children: 2   Years of education: Not on file   Highest education level: Not on file  Occupational History   Occupation: Retired  Tobacco Use   Smoking status: Former    Packs/day: 1.00    Years: 20.00    Additional pack years: 0.00    Total pack years: 20.00    Types: Cigarettes    Quit date: 01/29/1988    Years since quitting: 34.4   Smokeless tobacco: Never  Vaping Use   Vaping Use: Never used  Substance and Sexual Activity   Alcohol use: Not Currently    Comment: rare   Drug use: No   Sexual activity: Not Currently  Other Topics Concern   Not on file  Social History Narrative   Not on file   Social Determinants of Health   Financial Resource Strain: Not on file  Food Insecurity: No Food Insecurity (06/23/2022)   Hunger Vital Sign    Worried About Running Out of Food in the Last Year: Never true    Ran Out of Food in the Last Year: Never true  Transportation Needs: No Transportation Needs (06/23/2022)   PRAPARE - Administrator, Civil Service (Medical): No    Lack of Transportation (Non-Medical): No  Physical Activity: Not on file  Stress: Not on file  Social Connections: Not on file   Family History  Problem Relation Age of Onset   Heart attack Father    Scheduled Meds:  diltiazem  240 mg Oral Daily   [START ON 06/25/2022] ezetimibe  10 mg Oral Once per day on Mon Thu   levothyroxine  150 mcg Oral Q0600   Continuous Infusions:  sodium chloride 100 mL/hr at 06/24/22 0817   PRN Meds:.acetaminophen **OR**  acetaminophen, melatonin, ondansetron (ZOFRAN) IV, oxyCODONE-acetaminophen Allergies  Allergen Reactions   Diphenhydramine Other (See Comments)    Severe restless legs   CBC:    Component Value Date/Time   WBC 7.0 06/24/2022 0121   HGB 8.6 (L) 06/24/2022 0121   HGB 9.5 (L) 06/23/2022 1206   HGB 13.3 03/11/2018 0913   HCT 27.5 (L) 06/24/2022 0121   HCT 40.1 03/11/2018 0913   PLT 374 06/24/2022 0121   PLT 490 (H) 06/23/2022 1206   PLT 248 03/11/2018 0913   MCV 87.6 06/24/2022 0121   MCV 86 03/11/2018 0913   NEUTROABS 4.3 06/24/2022 0121   NEUTROABS 2.5 03/11/2018 0913   LYMPHSABS 1.8 06/24/2022 0121   LYMPHSABS 3.0 03/11/2018 0913   MONOABS 0.7 06/24/2022 0121   EOSABS 0.2 06/24/2022 0121   EOSABS 0.7 (H) 03/11/2018 0913   BASOSABS 0.0 06/24/2022 0121   BASOSABS 0.1 03/11/2018 0913   Comprehensive Metabolic Panel:    Component Value Date/Time  NA 124 (L) 06/24/2022 0121   NA 141 08/11/2021 1252   K 4.0 06/24/2022 0121   CL 92 (L) 06/24/2022 0121   CO2 24 06/24/2022 0121   BUN 14 06/24/2022 0121   BUN 14 08/11/2021 1252   CREATININE 0.84 06/24/2022 0121   CREATININE 0.94 06/23/2022 1206   GLUCOSE 106 (H) 06/24/2022 0121   CALCIUM 8.1 (L) 06/24/2022 0121   AST 23 06/24/2022 0121   AST 26 06/23/2022 1206   ALT 34 06/24/2022 0121   ALT 36 06/23/2022 1206   ALKPHOS 99 06/24/2022 0121   BILITOT 0.1 (L) 06/24/2022 0121   BILITOT 0.2 (L) 06/23/2022 1206   PROT 6.4 (L) 06/24/2022 0121   PROT 6.9 03/11/2018 0913   ALBUMIN 2.8 (L) 06/24/2022 0121   ALBUMIN 4.5 03/11/2018 0913    Physical Exam: Vital Signs: BP (!) 158/74 (BP Location: Left Arm)   Pulse 74   Temp 98.2 F (36.8 C) (Oral)   Resp 18   Ht 5\' 8"  (1.727 m)   Wt 74.9 kg   SpO2 97%   BMI 25.11 kg/m  SpO2: SpO2: 97 % O2 Device: O2 Device: Room Air O2 Flow Rate:   Intake/output summary:  Intake/Output Summary (Last 24 hours) at 06/24/2022 1610 Last data filed at 06/24/2022 9604 Gross per 24 hour  Intake  425 ml  Output 750 ml  Net -325 ml   LBM: Last BM Date : 06/23/22 Baseline Weight: Weight: 74.9 kg Most recent weight: Weight: 74.9 kg  General: NAD, lying in bed, chronically ill-appearing, awake Eyes: No drainage noted HENT: moist mucous membranes, poor dentition Cardiovascular: RRR Respiratory: no increased work of breathing noted, not in respiratory distress Abdomen: not distended Neuro: A&Ox4, following commands easily Psych: appropriately answers all questions          Palliative Performance Scale: 40%              Additional Data Reviewed: Recent Labs    06/23/22 1206 06/24/22 0121  WBC 7.3 7.0  HGB 9.5* 8.6*  PLT 490* 374  NA 124* 124*  BUN 15 14  CREATININE 0.94 0.84    Imaging: CT MAXILLOFACIAL W CONTRAST CLINICAL DATA:  Provided history: Maxillary sinus mass.  EXAM: CT MAXILLOFACIAL WITH CONTRAST  TECHNIQUE: Multidetector CT imaging of the maxillofacial structures was performed with intravenous contrast. Multiplanar CT image reconstructions were also generated.  RADIATION DOSE REDUCTION: This exam was performed according to the departmental dose-optimization program which includes automated exposure control, adjustment of the mA and/or kV according to patient size and/or use of iterative reconstruction technique.  CONTRAST:  70mL OMNIPAQUE IOHEXOL 300 MG/ML  SOLN  COMPARISON:  Maxillofacial CT 05/03/2022.  FINDINGS: Osseous: Redemonstrated destructive process involving the posterolateral wall the left maxillary sinus with osseous thinning and erosion. Progressive extension of this process into the soft tissues of the left masticator space since the prior examination of 05/03/2022. The region of soft tissue involvement in the left masticator space now measures 4.3 x 3.0 cm in transaxial dimensions (previously 4.3 x 2.2 cm). Superimposed within the left masticator space, there is a 2.9 x 1.6 cm peripherally enhancing gas and fluid containing  collection which is new from the prior study (for instance as seen on series 3, image 47). Nonspecific circumferential soft tissue within the adjacent left maxillary sinus measuring up to 1.4 cm in thickness, similar to the prior exam.  Orbits: No orbital mass or acute orbital finding.  Sinuses: Mild-to-moderate mucosal thickening within the right maxillary  sinus, slightly progressed. Circumferential soft tissue within the left maxillary sinus measuring up to 1.4 cm in thickness, similar to the prior exam. Redemonstrated tiny mucous retention cyst within the right sphenoid sinus. Mild mucosal thickening within the left sphenoid, bilateral ethmoid and bilateral frontal sinuses, similar to the prior study.  Soft tissues: Aggressive process involving the posterolateral wall the left maxillary sinus and left masticator space as described above.  Limited intracranial: No evidence of an acute intracranial abnormality within the field of view.  Impression #1 will be called to the ordering clinician or representative by the Radiologist Assistant, and communication documented in the PACS or [employer].  IMPRESSION: 1. Redemonstrated destructive process involving the posterolateral wall the left maxillary sinus with osseous thinning and erosion. Progressive extension of this process into the soft tissues of the left masticator space, as described. This includes a 2.9 x 1.6 cm peripherally enhancing gas and fluid containing collection within the left masticator space, which is new from the prior examination of 05/03/2022. Primary differential considerations are unchanged and include an aggressive infection (resulting from fungal etiologies among others) with new masticator space abscess, or metastatic disease with both osseous involvement and necrotic soft tissue mass. 2. Paranasal sinus disease elsewhere, as above.  Electronically Signed   By: Jackey Loge D.O.   On: 05/19/2022  12:09    I personally reviewed recent imaging.   Palliative Care Assessment and Plan Summary of Established Goals of Care and Medical Treatment Preferences   Patient is a 77 year old male with a past medical history of clear-cell renal cell carcinoma with metastatic disease to liver, hypertension, hyperlipidemia, hypothyroidism, adrenal insufficiency, anemia of chronic medical disease, and recent diagnosis of invasive fungal sinusitis with aspergillosis who was admitted on 06/23/2022 as a direct admission from cancer center for progressive weakness and fatigue in the setting of hyponatremia.  Since admission, patient has received medical management for hyponatremia as well as continued antibiotic management for invasive fungal sinusitis.  Out of medicine team consulted to assist with complex medical decision making. Of note patient seen by outpatient palliative medicine provider at [facility].  # Complex medical decision making/goals of care  -Discussed care with patient and his daughter while at bedside today.  Patient continuing with appropriate medical interventions to assist with cancer and infection management. Continuing medical management and work up at this time.   -Patient/daughter had multiple questions regarding medication management. Was able to reach out to ID, endo, and hospitalist to assist in coordinating medication management at this time.   -  Code Status: Full Code   # Symptom management  -Adrenal insufficiency   -Hospitalist managing patient's chronic hydrocortisone   -Ordered a.m cortisol for tomorrow   -Poor appetite and sleep   -Start olanzapine 2.5mg  qhs. Will likely need up titration of dose.  # Psycho-social/Spiritual Support:  - Support System: daughters  # Discharge Planning:  TBD  Thank you for allowing the palliative care team to participate in the care Liev Leonor Liv.  Alvester Morin, DO Palliative Care Provider PMT # [phone]  If patient remains symptomatic  despite maximum doses, please call PMT at [phone] between 0700 and 1900. Outside of these hours, please call attending, as PMT does not have night coverage.  This provider spent a total of 80 minutes providing patient's care.  Includes review of EMR, discussing care with other staff members involved in patient's medical care, obtaining relevant history and information from patient and/or patient's family, and personal review of imaging and lab  work. Greater than 50% of the time was spent counseling and coordinating care related to the above assessment and plan.    *Please note that this is a verbal dictation therefore any spelling or grammatical errors are due to the "[employer] One" system interpretation.

## 2022-06-24 NOTE — Telephone Encounter (Signed)
Patient Advocate Encounter   Received notification that prior authorization for Posaconazole 100MG  dr tablets is required.   PA submitted on 06/24/2022 [employer] Electronic PA Form Status is pending       Roland Earl, CPhT Pharmacy Patient Advocate Specialist [facility] Patient Advocate Team Direct Number: [phone]  Fax: [phone]

## 2022-06-24 NOTE — TOC Benefit Eligibility Note (Signed)
Patient Product/process development scientist completed.    The patient is currently admitted and upon discharge could be taking posaconazole 100 mg tablets.  Requires Prior Authorization   The patient is currently admitted and upon discharge could be taking Cresemba 186 mg.  Product Not on Formulary  The patient is insured through [employer] Part D   This test claim was processed through [facility]- copay amounts may vary at other pharmacies due to pharmacy/plan contracts, or as the patient moves through the different stages of their insurance plan.  Roland Earl, CPHT Pharmacy Patient Advocate Specialist [facility] Patient Advocate Team Direct Number: [phone]  Fax: [phone]

## 2022-06-25 DIAGNOSIS — E279 Disorder of adrenal gland, unspecified: Secondary | ICD-10-CM

## 2022-06-25 DIAGNOSIS — Z79899 Other long term (current) drug therapy: Secondary | ICD-10-CM

## 2022-06-25 DIAGNOSIS — R531 Weakness: Secondary | ICD-10-CM | POA: Diagnosis not present

## 2022-06-25 LAB — BASIC METABOLIC PANEL
Anion gap: 9 (ref 5–15)
BUN: 11 mg/dL (ref 8–23)
CO2: 23 mmol/L (ref 22–32)
Calcium: 7.8 mg/dL — ABNORMAL LOW (ref 8.9–10.3)
Chloride: 91 mmol/L — ABNORMAL LOW (ref 98–111)
Creatinine, Ser: 0.84 mg/dL (ref 0.61–1.24)
GFR, Estimated: >60 mL/min (ref >60)
Glucose, Bld: 99 mg/dL (ref 70–99)
Potassium: 3.9 mmol/L (ref 3.5–5.1)
Sodium: 123 mmol/L — ABNORMAL LOW (ref 135–145)

## 2022-06-25 LAB — CORTISOL-AM, BLOOD: Cortisol - AM: 5.2 ug/dL — ABNORMAL LOW (ref 6.7–22.6)

## 2022-06-25 MED ORDER — HYDROCORTISONE 10 MG PO TABS
10.0000 mg | ORAL_TABLET | Freq: Every day | ORAL | Status: DC
  Administered 2022-06-25 – 2022-06-27 (×3): 10 mg via ORAL
  Filled 2022-06-25 (×3): qty 1

## 2022-06-25 MED ORDER — HYDROCORTISONE 10 MG PO TABS
10.0000 mg | ORAL_TABLET | ORAL | Status: AC
  Administered 2022-06-25: 10 mg via ORAL
  Filled 2022-06-25: qty 1

## 2022-06-25 MED ORDER — HYDROCORTISONE 20 MG PO TABS
20.0000 mg | ORAL_TABLET | Freq: Every morning | ORAL | Status: DC
  Administered 2022-06-26 – 2022-06-28 (×3): 20 mg via ORAL
  Filled 2022-06-25 (×3): qty 1

## 2022-06-25 MED ORDER — OLANZAPINE 5 MG PO TABS
5.0000 mg | ORAL_TABLET | Freq: Every day | ORAL | Status: DC
  Administered 2022-06-25 – 2022-06-27 (×3): 5 mg via ORAL
  Filled 2022-06-25 (×3): qty 1

## 2022-06-25 NOTE — Progress Notes (Signed)
PROGRESS NOTE    [name]  WUJ:[id number] DOB: 08-20-1945 DOA: 06/23/2022 PCP: [name], MD    Brief Narrative:   [name] is a 77 y.o. male with past medical history significant for clear-cell renal cell carcinoma with metastasis to liver, HTN, HLD, hypothyroidism, adrenal insufficiency, anemia of chronic medical disease, and recent diagnosis of invasive fungal sinusitis with aspergillosis who presented as a direct admission from the cancer center to [facility] for progressive weakness and fatigue in the setting of hyponatremia.  Patient reports 1 week progressive generalized weakness without associated acute focal complaints.  Patient follows with medical oncology outpatient, Dr. Shirline Frees.  Has been unable to tolerate his chemotherapy for greater than 2 months now as well as dealing with new invasive fungal infection.  Patient was found to have a sodium level of 124 at the oncology clinic.  He denies fever, no chills, no myalgias, no chest pain, no shortness of breath, no palpitations.  On arrival to [facility], temperature 98.1 F, HR 81, RR 16, BP 155/76, SpO2 99% on room air.  WBC 7.0, hemoglobin 8.6, platelets 374.  Sodium 124, potassium 4.0, chloride 92, CO2 24, glucose 106, BUN 14, creatinine 0.84.  AST 23, ALT 34, total Ruben 0.1.  Urinalysis unrevealing.  Urine sodium 137, urine osmolality 477.  Chest x-ray with no acute cardiopulmonary abnormalities.  Assessment & Plan:   Hyponatremia Patient with reported generalized weakness, fatigue over the last week.  Recent labs at oncology clinic with sodium of 124.  Patient admits to poor oral intake.  Recent sodium 141 on 06/11/2022.  Etiology likely multifactorial secondary to hypovolemic hyponatremia in the setting of poor oral intake, adrenal insufficiency and possible medication side effect with voriconazole.  Urine sodium elevated 137, urine osmolality 477.  TSH and uric acid level unrevealing.  Cortisol  level low at 5.2. -- Na [phone] -- Increased NS to 12mL/h -- NaCl 1g PO TID -- increased hydrocortisone as below for low cortisol level -- Encourage increased oral intake -- BMP daily  Right clear renal cell carcinoma with liver metastasis Follows with urology, Dr. Marlou Porch and medical oncology Dr. Arbutus Ped outpatient.  Underwent radical right renal nephrectomy February 01, 2015 and omental biopsy July 2019.  Treated initially with immunotherapy that was discontinued due to adrenal insufficiency and panhypopituitarism.  Completed radiation to abdominal lymph nodes in 2021.  Currently on chemotherapy with Cabometyx; but has been off for roughly 2 months per oncology.  Invasive fungal sinusitis Follows with infectious disease, Dr. Earlene Plater and ENT, Dr. Darl Pikes outpatient.  Recent biopsy with aspergillosis fumigatus.  Complicated by long-term steroid use. -- Posaconazole 300 mg p.o. twice daily  Hypothyroidism TSH 1.830, within normal limits.  Recently seen by endocrinology outpatient. -- Levothyroxine 150 mcg and 175 mcg PO every other day  Adrenal insufficiency Follows with endocrinology outpatient.  A.m. cortisol level low at 5.2. -- Checking aldosterone/renin activity level -- Hydrocortisone increased to 20 mg p.o. qAm and 10 mg PO qPM  Essential hypertension -- Cardizem 240 mg p.o. daily  Anemia of chronic medical disease Hemoglobin 8.6, stable.  Weakness/debility/deconditioning: PT recommended home health on discharge, patient currently declining.   DVT prophylaxis: apixaban (ELIQUIS) tablet 2.5 mg Start: 06/24/22 1700 SCDs Start: 06/23/22 1912 apixaban (ELIQUIS) tablet 2.5 mg    Code Status: Full Code Family Communication: Present bedside this morning  Disposition Plan:  Level of care: Med-Surg Status is: Inpatient Remains inpatient appropriate because: IV fluid hydration; needs improvement of sodium prior  to able for discharge home    Consultants:  Palliative  care Infectious disease  Procedures:  None  Antimicrobials:  posaconazole   Subjective: Patient seen examined bedside, resting comfortably.  Sitting in bedside chair eating breakfast.  Continues with generalized weakness, fatigue.  Denies any acute localization of symptoms.  Continues with poor oral intake.  No other specific questions or concerns at this time.  Increasing IV fluid rate this morning.  Discussed with RN.  Denies headache, no dizziness, no chest pain, no shortness of breath, no abdominal pain, no focal weakness, no cough/congestion, no fever/chills/night sweats, no nausea/vomiting/diarrhea, no paresthesias.  No acute events overnight per nursing staff.  Objective: Vitals:   06/24/22 1343 06/24/22 2156 06/25/22 0500 06/25/22 0510  BP: 139/72 (!) 161/71  (!) 150/67  Pulse: 72 77  77  Resp: 16 15  16   Temp: 98.3 F (36.8 C) 99.1 F (37.3 C)  99.6 F (37.6 C)  TempSrc: Oral   Oral  SpO2: 98% 99%  96%  Weight:   78.1 kg   Height:        Intake/Output Summary (Last 24 hours) at 06/25/2022 1218 Last data filed at 06/25/2022 0600 Gross per 24 hour  Intake 2920 ml  Output 1500 ml  Net 1420 ml   Filed Weights   06/23/22 2001 06/25/22 0500  Weight: 74.9 kg 78.1 kg    Examination:  Physical Exam: GEN: NAD, alert and oriented x 3, chronically ill/elderly in appearance HEENT: NCAT, PERRL, EOMI, sclera clear, MMM PULM: CTAB w/o wheezes/crackles, normal respiratory effort, on room air CV: RRR w/o M/G/R GI: abd soft, NTND, NABS, no R/G/M MSK: no peripheral edema, muscle strength globally intact 5/5 bilateral upper/lower extremities NEURO: CN II-XII intact, no focal deficits, sensation to light touch intact PSYCH: normal mood/affect Integumentary: dry/intact, no rashes or wounds    Data Reviewed: I have personally reviewed following labs and imaging studies  CBC: Recent Labs  Lab 06/23/22 1206 06/24/22 0121  WBC 7.3 7.0  NEUTROABS 5.1 4.3  HGB 9.5* 8.6*  HCT  29.1* 27.5*  MCV 84.6 87.6  PLT 490* 374   Basic Metabolic Panel: Recent Labs  Lab 06/23/22 1206 06/24/22 0121 06/24/22 0845 06/24/22 1805 06/25/22 0355  NA 124* 124* 126* 123* 123*  K 4.1 4.0 4.2  --  3.9  CL 89* 92* 93*  --  91*  CO2 26 24 26   --  23  GLUCOSE 139* 106* 92  --  99  BUN 15 14 11   --  11  CREATININE 0.94 0.84 0.83  --  0.84  CALCIUM 8.5* 8.1* 8.2*  --  7.8*  MG  --  1.8  --   --   --    GFR: Estimated Creatinine Clearance: 69 mL/min (by C-G formula based on SCr of 0.84 mg/dL). Liver Function Tests: Recent Labs  Lab 06/23/22 1206 06/24/22 0121  AST 26 23  ALT 36 34  ALKPHOS 120 99  BILITOT 0.2* 0.1*  PROT 6.7 6.4*  ALBUMIN 2.9* 2.8*   No results for input(s): "LIPASE", "AMYLASE" in the last 168 hours. No results for input(s): "AMMONIA" in the last 168 hours. Coagulation Profile: Recent Labs  Lab 06/24/22 0649  INR 0.9   Cardiac Enzymes: No results for input(s): "CKTOTAL", "CKMB", "CKMBINDEX", "TROPONINI" in the last 168 hours. BNP (last 3 results) No results for input(s): "PROBNP" in the last 8760 hours. HbA1C: No results for input(s): "HGBA1C" in the last 72 hours. CBG: No results for  input(s): "GLUCAP" in the last 168 hours. Lipid Profile: No results for input(s): "CHOL", "HDL", "LDLCALC", "TRIG", "CHOLHDL", "LDLDIRECT" in the last 72 hours. Thyroid Function Tests: Recent Labs    06/24/22 0121  TSH 1.830   Anemia Panel: Recent Labs    06/24/22 0649  VITAMINB12 372   Sepsis Labs: No results for input(s): "PROCALCITON", "LATICACIDVEN" in the last 168 hours.  No results found for this or any previous visit (from the past 240 hour(s)).       Radiology Studies: DG Chest Port 1 View  Result Date: 06/24/2022 CLINICAL DATA:  Acute hyponatremia. EXAM: PORTABLE CHEST 1 VIEW COMPARISON:  CT chest 05/03/2022 FINDINGS: Stable cardiomediastinal contours. Status post right middle and right lower lobectomies. Asymmetric right lung volume  loss is noted with elevation of the right hemidiaphragm. Surgical clips within the mediastinum and right infrahilar region are again seen. No superimposed pleural effusion, airspace consolidation or interstitial edema. IMPRESSION: 1. No acute cardiopulmonary abnormalities. 2. Status post right middle and right lower lobectomies. Electronically Signed   By: Signa Kell M.D.   On: 06/24/2022 11:26        Scheduled Meds:  apixaban  2.5 mg Oral BID   diltiazem  240 mg Oral Daily   ezetimibe  10 mg Oral Once per day on Mon Thu   hydrocortisone  10 mg Oral QHS   [START ON 06/26/2022] hydrocortisone  20 mg Oral q morning   [START ON 06/26/2022] levothyroxine  150 mcg Oral QODAY   levothyroxine  175 mcg Oral QODAY   mupirocin ointment  1 Application Topical Daily   OLANZapine  2.5 mg Oral QHS   posaconazole  300 mg Oral Q breakfast   sodium chloride  1 g Oral TID WC   Continuous Infusions:  sodium chloride 125 mL/hr at 06/25/22 0626     LOS: 1 day    Time spent: 53 minutes spent on chart review, discussion with nursing staff, consultants, updating family and interview/physical exam; more than 50% of that time was spent in counseling and/or coordination of care.    Alvira Philips [place], DO [facility] Available via Epic secure chat 7am-7pm After these hours, please refer to coverage provider listed on [email or web address] 06/25/2022, 12:18 PM

## 2022-06-25 NOTE — Plan of Care (Signed)
  Problem: Education: Goal: Knowledge of General Education information will improve Description Including pain rating scale, medication(s)/side effects and non-pharmacologic comfort measures Outcome: Progressing   Problem: Activity: Goal: Risk for activity intolerance will decrease Outcome: Progressing   Problem: Safety: Goal: Ability to remain free from injury will improve Outcome: Progressing   

## 2022-06-25 NOTE — TOC Initial Note (Signed)
Transition of Care [facility]) - Initial/Assessment Note   Patient Details  Name: [name] MRN: [id number] Date of Birth: 1945-12-20  [facility]) CM/SW Contact:    Ewing Schlein, LCSW Phone Number: 06/25/2022, 10:52 AM  Clinical Narrative: PT evaluation recommended HHPT. CSW met with patient to discuss recommendation. Patient reported he will not need HH at discharge and declined the service at this time.                 Expected Discharge Plan: Home/Self Care Barriers to Discharge: Continued Medical Work up  Patient Goals and CMS Choice Patient states their goals for this hospitalization and ongoing recovery are:: Return home Choice offered to / list presented to : NA  Expected Discharge Plan and Services In-house Referral: Clinical Social Work Discharge Planning Services: NA Post Acute Care Choice: NA Living arrangements for the past 2 months: Single Family Home             DME Arranged: N/A DME Agency: NA  Prior Living Arrangements/Services Living arrangements for the past 2 months: Single Family Home Patient language and need for interpreter reviewed:: Yes Do you feel safe going back to the place where you live?: Yes      Need for Family Participation in Patient Care: No (Comment) Care giver support system in place?: Yes (comment) Criminal Activity/Legal Involvement Pertinent to Current Situation/Hospitalization: No - Comment as needed  Activities of Daily Living Home Assistive Devices/Equipment: None ADL Screening (condition at time of admission) Patient's cognitive ability adequate to safely complete daily activities?: Yes Is the patient deaf or have difficulty hearing?: No Does the patient have difficulty seeing, even when wearing glasses/contacts?: No Does the patient have difficulty concentrating, remembering, or making decisions?: No Patient able to express need for assistance with ADLs?: Yes Does the patient have difficulty dressing or bathing?:  No Independently performs ADLs?: Yes (appropriate for developmental age) Does the patient have difficulty walking or climbing stairs?: No Weakness of Legs: None Weakness of Arms/Hands: None  Emotional Assessment Attitude/Demeanor/Rapport: Engaged Affect (typically observed): Appropriate, Pleasant Orientation: : Oriented to Self, Oriented to Place, Oriented to  Time, Oriented to Situation Alcohol / Substance Use: Not Applicable Psych Involvement: No (comment)  Admission diagnosis:  Generalized weakness [R53.1] Hyponatremia [E87.1] Patient Active Problem List   Diagnosis Date Noted   Anemia of chronic disease 06/24/2022   Hyponatremia 06/24/2022   Palliative care encounter 06/24/2022   Goals of care, counseling/discussion 06/24/2022   Counseling and coordination of care 06/24/2022   Need for emotional support 06/24/2022   Poor appetite 06/24/2022   Invasive fungal sinusitis 06/10/2022   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) 06/10/2022   Maxillary sinusitis 05/22/2022   Jaw pain 03/31/2022   Adrenal insufficiency (HCC) 08/23/2018   Atherosclerosis of aorta (HCC) 08/23/2018   Acquired hypothyroidism 04/19/2018   Secondary adrenal insufficiency (HCC) 04/19/2018   Acute hyponatremia 03/24/2018   Essential hypertension    Protein-calorie malnutrition, severe 03/14/2018   Generalized weakness 03/13/2018   Hyperlipidemia 11/30/2017   Kidney cancer, primary, with metastasis from kidney to other site [facility]) 09/14/2017   Recurrent umbilical hernia 08/01/2015   Incisional hernia 08/01/2015   Hypertensive heart disease without CHF    GERD (gastroesophageal reflux disease)    Chronic kidney disease    Coronary artery disease    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 02/01/2015   PCP:  [name], MD Pharmacy:   [facility] [place] - [address] [place]  [address] Phone: [phone] Fax: [phone]  [facility] [address] [place] [address] Phone: [phone] Fax: [phone]  [facility] [address] [place] [address] Phone: [phone] Fax: [phone]  Social Determinants of Health (SDOH) Social History: SDOH Screenings   Food Insecurity: No Food Insecurity (06/23/2022)  Housing: Low Risk  (06/23/2022)  Transportation Needs: No Transportation Needs (06/23/2022)  Utilities: Not At Risk (06/23/2022)  Depression (PHQ2-9): Low Risk  (06/10/2022)  Tobacco Use: Medium Risk (06/24/2022)   SDOH Interventions:    Readmission Risk Interventions     No data to display

## 2022-06-25 NOTE — Progress Notes (Signed)
Daily Progress Note   Patient Name: [name]       Date: 06/25/2022 DOB: Jun 15, 1945  Age: 77 y.o. MRN#: [id number] Attending Physician: [place], Eric J, DO Primary Care Physician: [name], MD Admit Date: 06/23/2022 Length of Stay: 1 day  Reason for Consultation/Follow-up: Establishing goals of care and Symptom Management  Subjective:   CC: Patient filling a little better though overall still tired. Following up regarding complex medical decision making and symptom management.   Subjective:  Review EMR prior to seeing patient. In past 24 hours at time of EMR review, patient had received Ativan 1mg  x1 dose. Patient started on olanzapine 2.5mg  last night.  AM cortisol today noted low level at 5.2. Hospitalist has already ordered increased in hydrocortisone to manage.   Presented to bedside to meet with patient. Patient's daughter present at bedside as well. Patient seen sitting up on the edge of the bed about to eat lunch. Patient able to state he feels slightly better though overall still very tired. Daughter noted patient was able to do one lap of walking around outside room on floor before stopping due to need to rest.  Patient reports anxiety that is uncontrolled currently. Patient received Ativan 1mg  overnight though cannot describe if/how this managed his anxiety. Patient wanting to receive medication currently for anxiety so noted could provide dose now to see how medication is working.  Also noted would increase patient's olanzapine dose tonight to assist with management.  Patient and daughter agreed with this.  All questions answered at that time. Noted palliative medicine team would continue to follow along to assist with management.   Review of Systems Fatigue, poor appetite Objective:   Vital Signs:  BP (!) 150/67 (BP Location: Right Arm)   Pulse 77   Temp 99.6 F (37.6 C) (Oral)   Resp 16   Ht 5\' 8"  (1.727 m)   Wt 78.1 kg   SpO2 96%   BMI 26.18 kg/m    Physical Exam: General: NAD, sitting on edge of bed, chronically ill-appearing, awake Eyes: No drainage noted HENT: moist mucous membranes, poor dentition Cardiovascular: RRR Respiratory: no increased work of breathing noted, not in respiratory distress Abdomen: not distended Neuro: A&Ox4, following commands easily Psych: appropriately answers all questions  Imaging:  I personally reviewed recent imaging.   Assessment & Plan:   Assessment: Patient is a 77 year old male with a past medical history of clear-cell renal cell carcinoma with metastatic disease to liver, hypertension, hyperlipidemia, hypothyroidism, adrenal insufficiency, anemia of chronic medical disease, and recent diagnosis of invasive fungal sinusitis with aspergillosis who was admitted on 06/23/2022 as a direct admission from cancer center for progressive weakness and fatigue in the setting of hyponatremia.  Since admission, patient has received medical management for hyponatremia as well as continued antibiotic management for invasive fungal sinusitis.  Out of medicine team consulted to assist with complex medical decision making. Of note patient seen by outpatient palliative medicine provider at [facility]  Recommendations/Plan: # Complex medical decision making/goals of care:    -Discussed care with patient and his daughter while at bedside today.  Patient continuing with appropriate medical interventions to assist with cancer and infection management. Continuing medical management and work up at this time.                 -  Code Status: Full Code    # Symptom management                -Adrenal insufficiency                               -  Hospitalist increased hydrocortisone dose today based on low cortisol level this AM                  -Poor appetite and sleep, likely multifactorial                               -Increase olanzapine to 5mg  qhs. Continue to titrate based on response.    # Psycho-social/Spiritual  Support:  - Support System: daughters  # Discharge Planning: Home with Palliative [facility] at [facility]  Discussed with: patient, daughter, hospitalist   Thank you for allowing the palliative care team to participate in the care Kalief Leonor Liv.  Alvester Morin, DO Palliative Care Provider PMT # [phone]  If patient remains symptomatic despite maximum doses, please call PMT at [phone] between 0700 and 1900. Outside of these hours, please call attending, as PMT does not have night coverage.  *Please note that this is a verbal dictation therefore any spelling or grammatical errors are due to the "Dragon Medical One" system interpretation.

## 2022-06-26 DIAGNOSIS — F419 Anxiety disorder, unspecified: Secondary | ICD-10-CM

## 2022-06-26 DIAGNOSIS — R531 Weakness: Secondary | ICD-10-CM | POA: Diagnosis not present

## 2022-06-26 DIAGNOSIS — E871 Hypo-osmolality and hyponatremia: Secondary | ICD-10-CM | POA: Diagnosis not present

## 2022-06-26 LAB — BASIC METABOLIC PANEL
Anion gap: 8 (ref 5–15)
BUN: 8 mg/dL (ref 8–23)
CO2: 23 mmol/L (ref 22–32)
Calcium: 8.1 mg/dL — ABNORMAL LOW (ref 8.9–10.3)
Chloride: 94 mmol/L — ABNORMAL LOW (ref 98–111)
Creatinine, Ser: 0.85 mg/dL (ref 0.61–1.24)
GFR, Estimated: >60 mL/min (ref >60)
Glucose, Bld: 116 mg/dL — ABNORMAL HIGH (ref 70–99)
Potassium: 4 mmol/L (ref 3.5–5.1)
Sodium: 125 mmol/L — ABNORMAL LOW (ref 135–145)

## 2022-06-26 LAB — HIV ANTIBODY (ROUTINE TESTING W REFLEX): HIV Screen 4th Generation wRfx: NONREACTIVE

## 2022-06-26 MED ORDER — ENSURE ENLIVE PO LIQD
237.0000 mL | Freq: Three times a day (TID) | ORAL | Status: DC
  Administered 2022-06-26 – 2022-06-28 (×6): 237 mL via ORAL

## 2022-06-26 MED ORDER — CLONAZEPAM 0.125 MG PO TBDP
0.5000 mg | ORAL_TABLET | Freq: Two times a day (BID) | ORAL | Status: DC
  Administered 2022-06-26 – 2022-06-28 (×4): 0.5 mg via ORAL
  Filled 2022-06-26 (×4): qty 4

## 2022-06-26 NOTE — Progress Notes (Signed)
Occupational Therapy Treatment Patient Details Name: [name] MRN: [id number] DOB: [birth year] Today's Date: 06/26/2022   History of present illness 77 y.o. male admitted to [facility] on 06/23/2022 as a direct admission from outpatient oncology office with generalized weakness after presenting from home to oncology clinic complaining of generalized weakness.Found to have acute hyponatremia. PHMx:metastatic clear-cell renal cancer, with mets to the liver, essential pretension, hyperlipidemia, acquired hypothyroidism, anemia of chronic disease associated baseline hemoglobin range 9-12.   OT comments  Pt. Seen for skilled OT treatment session.  Pt. Very lethargic and difficult to sustain arousal for participation.  States he did not sleep well last night.  Introduced and provided examples of energy conservation strategies for use during mobility and adl completion.  Pt. Receptive to examples and verbalized understanding.  Handouts provided also for him to review as needed.  Agree with current d/c recommendations.     Recommendations for follow up therapy are one component of a multi-disciplinary discharge planning process, led by the attending physician.  Recommendations may be updated based on patient status, additional functional criteria and insurance authorization.    Assistance Recommended at Discharge PRN  Patient can return home with the following  Assistance with cooking/housework;Help with stairs or ramp for entrance;Assist for transportation   Equipment Recommendations  None recommended by OT    Recommendations for Other Services      Precautions / Restrictions Precautions Precautions: Fall       Mobility Bed Mobility                    Transfers                         Balance                                           ADL either performed or assessed with clinical judgement   ADL Overall ADL's : Needs assistance/impaired                                        General ADL Comments: focus of session was introduction and review of energy conservation strategies.  pt. very lethargic reports not sleeping well last night.  cues to sustain arousal during session.  provided examples, pt. receptive. handouts also provided for pt. to review at his leisure.    Extremity/Trunk Assessment              Vision       Perception     Praxis      Cognition Arousal/Alertness: Lethargic Behavior During Therapy: WFL for tasks assessed/performed Overall Cognitive Status: Within Functional Limits for tasks assessed                                          Exercises      Shoulder Instructions       General Comments  Like westerns and for the tv to be on the channel that plays westerns    Pertinent Vitals/ Pain       Pain Assessment Pain Assessment: No/denies pain  Home Living  Prior Functioning/Environment              Frequency  Min 1X/week        Progress Toward Goals  OT Goals(current goals can now be found in the care plan section)  Progress towards OT goals: Progressing toward goals     Plan Discharge plan remains appropriate    Co-evaluation                 AM-PAC OT "6 Clicks" Daily Activity     Outcome Measure   Help from another person eating meals?: None Help from another person taking care of personal grooming?: A Little Help from another person toileting, which includes using toliet, bedpan, or urinal?: A Little Help from another person bathing (including washing, rinsing, drying)?: A Little Help from another person to put on and taking off regular upper body clothing?: A Little Help from another person to put on and taking off regular lower body clothing?: A Little 6 Click Score: 19    End of Session    OT Visit Diagnosis: Unsteadiness on feet (R26.81);Muscle weakness  (generalized) (M62.81)   Activity Tolerance Patient tolerated treatment well;Patient limited by lethargy   Patient Left in bed;with call bell/phone within reach   Nurse Communication          Time: 1105-1120 OT Time Calculation (min): 15 min  Charges: OT General Charges $OT Visit: 1 Visit OT Treatments $Self Care/Home Management : 8-22 mins  Boneta Lucks, COTA/L Acute Rehabilitation [phone]   Alessandra Bevels Lorraine-COTA/L 06/26/2022, 11:31 AM

## 2022-06-26 NOTE — Progress Notes (Signed)
[facility] Infectious Diseases Follow Up Note  Patient Identification: Patient Name: [name] MRN: [id number] Admit Date: 06/23/2022  5:57 PM Age: 77 y.o.Today's Date: 06/26/2022  Reason for Visit: Fungal sinusitis  Principal Problem:   Generalized weakness Active Problems:   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   Hyperlipidemia   Essential hypertension   Acute hyponatremia   Acquired hypothyroidism   Anemia of chronic disease   Hyponatremia   Palliative care encounter   Goals of care, counseling/discussion   Counseling and coordination of care   Need for emotional support   Poor appetite   Adrenal disease (HCC)   Medication management  Antibiotics:  Not taking Voriconazole for over a week since 5/3   Lines/Hardware:  Interval Events: Continues to be afebrile, sodium level went down but again uptrending.  Found to have low cortisol levels and hydrocortisone dosing increased   Assessment 77 year old male with PMH as below including metastatic clear-cell renal carcinoma with liver metastasis status post rt nephrectomy in December 2016, s/p omental biopsy July 2019 s/p immunotherapy with Ipilizumab and nivolumab  stopped due to adrenal insufficiency and panhypopituitarism, s/p radiotherapy completed sept 2021 with chemotherapy started with Cabometyx March 2023 but stopped in the setting of Aspergillus sinusitis ( follows Dr Arbutus Ped) CAD, lung cancer status post lobectomy 1992,  HLD, HTN, hypothyroidism as well as recently started on voriconazole for possible Aspergillus rhinosinusitis who is admitted to [facility] on 5/7 as a direct admit given generalized weakness. Found to have    # Hyponatremia - likley hypovolemic in the setting of poor oral intake and dehydration, Very unlikely 2/2 Voriconazole ( a rare side effect ) but timeline is suggestive of so ( he has stopped twice by himself). Patient also with  h/o adrenocortical insufficiency with low cortisol levels.  Hydrocortisone dosing increased.  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 No reported h/o DM, on steroids for temporal arteritis early this year. 5/10 HIV NR  Recommendations Continue Posoconazole for now, although hyponatremia  very unlikely to be related to Voriconazole and may need to reconsider starting it eventually once sodium levels are better given preferred treatment but on lower dose to start  Will plan for level of posaconazole next Tuesday Fu aspergillus ag in serum  Fu CT maxillofacial in June 3 noted, do not think repeat imaging is needed currently from fungal sinusitis standpoint unless new symptoms  Management of Hyponatremia per primary   Dr Drue Second covering this weekend. I am back Monday. Please call with questions.   Rest of the management as per the primary team. Thank you for the consult. Please page with pertinent questions or concerns.  ______________________________________________________________________ Subjective patient seen and examined at the bedside. Spoke with daughter today who says patient had taken few days of after being prescribed on 4/15 by Dr Marene Lenz but then stopped after 5-6 days after " not feeling good" , more tired and weak. He was not taking Voriconazole when he saw Dr Earlene Plater on 4/24 and restarted back for 5-6 days and stopped again as he felt poorly around 5/3. Daughter states his appetite was poor prior to voriconazole but with Voriconazole, it makes worse. No complaints otherwise. He  had a good breakfast this morning    Vitals BP (!) 156/83 (BP Location: Right Arm)   Pulse 73    Temp 98.4 F (36.9 C) (Oral)   Resp 17   Ht 5\' 8"  (1.727 m)   Wt 79.4 kg   SpO2 99%   BMI 26.62 kg/m     Physical Exam Constitutional: Elderly male lying in the bed, does not interact much    Comments:   Cardiovascular:     Rate and Rhythm: Normal rate and regular rhythm.     Heart sounds:   Pulmonary:     Effort: Pulmonary effort is normal.     Comments:   Abdominal:     Palpations: Abdomen is soft.     Tenderness: Nondistended  Musculoskeletal:        General: No swelling or tenderness.   Skin:    Comments: No rashes  Neurological:     General: awake, alert and oriented, grossly non focal and follows commands   Psychiatric:        Mood and Affect: Mood normal.   Pertinent labs    Latest Ref Rng & Units 06/24/2022    1:21 AM 06/23/2022   12:06 PM 06/11/2022    7:55 AM  CBC  WBC 4.0 - 10.5 K/uL 7.0  7.3  9.9   Hemoglobin 13.0 - 17.0 g/dL 8.6  9.5  9.7   Hematocrit 39.0 - 52.0 % 27.5  29.1  31.0   Platelets 150 - 400 K/uL 374  490  338       Latest Ref Rng & Units 06/26/2022    3:53 AM 06/25/2022    3:55 AM 06/24/2022    6:05 PM  CMP  Glucose 70 - 99 mg/dL 161  99    BUN 8 - 23 mg/dL 8  11    Creatinine 0.96 - 1.24 mg/dL 0.45  4.09    Sodium 811 - 145 mmol/L 125  123  123   Potassium 3.5 - 5.1 mmol/L 4.0  3.9    Chloride 98 - 111 mmol/L 94  91    CO2 22 - 32 mmol/L 23  23    Calcium 8.9 - 10.3 mg/dL 8.1  7.8     Pertinent micro Results for orders placed or performed during the hospital encounter of 05/22/22  Aerobic/Anaerobic Culture w Gram Stain (surgical/deep wound)     Status: None   Collection Time: 05/22/22  8:40 AM   Specimen: PATH Other; Body Fluid  Result Value Ref Range Status   Specimen Description MAXILLARY SINUS  Final   Special Requests NONE  Final   Gram Stain   Final    FEW WBC PRESENT, PREDOMINANTLY PMN NO ORGANISMS SEEN Performed at [facility], [address] [place], [place] [address]    Culture   Final    MOLD SEE  SEPARATE REPORT NO ANAEROBES ISOLATED; CULTURE IN PROGRESS FOR 5 DAYS RARE PROPIONIBACTERIUM ACNES    Report Status 05/28/2022 FINAL  Final  Organism Identification, Mold     Status: Abnormal   Collection Time: 05/22/22  8:40 AM  Result Value Ref Range Status   Organism Identification, Mold Final report (A)  Final    Comment: (NOTE) Performed At: [facility] [address] [place], [place] [id number] Jolene Schimke MD MV:[id number]    Source of Sample MAXILLARY SINUS  Final    Comment: Performed at [facility], [address] [place], [place] [address]  Mold  organism reflex     Status: Abnormal   Collection Time: 05/22/22  8:40 AM  Result Value Ref Range Status   Mold Result - 1 Aspergillus fumigatus (A)  Final    Comment: (NOTE) Performed At: [facility] [address] [place], [place] [id number] Jolene Schimke MD JX:[id number]   Aerobic/Anaerobic Culture w Gram Stain (surgical/deep wound)     Status: None   Collection Time: 05/22/22  8:41 AM   Specimen: PATH Other; Tissue  Result Value Ref Range Status   Specimen Description TISSUE MAXILLARY SINUS  Final   Special Requests NONE  Final   Gram Stain RARE WBC SEEN NO ORGANISMS SEEN   Final   Culture   Final    No growth aerobically or anaerobically. Performed at [facility], [address] [place], [place] [address]    Report Status 05/27/2022 FINAL  Final     Pertinent Imaging today Plain films and CT images have been personally visualized and interpreted; radiology reports have been reviewed. Decision making incorporated into the Impression /   No results found.  I have personally spent 52 minutes involved in face-to-face and non-face-to-face activities for this patient on the day of the visit. Professional time spent includes the following activities: Preparing to see the patient (review of tests), Obtaining and/or reviewing separately obtained history (admission/discharge record),  Performing a medically appropriate examination and/or evaluation , Ordering medications/tests/procedures, referring and communicating with other health care professionals, Documenting clinical information in the EMR, Independently interpreting results (not separately reported), Communicating results to the patient/family/caregiver, Counseling and educating the patient/family/caregiver and Care coordination (not separately reported).   Plan d/w requesting provider as well as ID pharm D  Note: This document was prepared using dragon voice recognition software and may include unintentional dictation errors.   Electronically signed by:   Odette Fraction, MD Infectious Disease Physician [facility] Pager: [phone]

## 2022-06-26 NOTE — Plan of Care (Signed)
  Problem: Education: Goal: Knowledge of General Education information will improve Description: Including pain rating scale, medication(s)/side effects and non-pharmacologic comfort measures Outcome: Progressing   Problem: Health Behavior/Discharge Planning: Goal: Ability to manage health-related needs will improve Outcome: Progressing   Problem: Activity: Goal: Risk for activity intolerance will decrease Outcome: Progressing   

## 2022-06-26 NOTE — Progress Notes (Signed)
PROGRESS NOTE    [name]  ZOX:[id number] DOB: 08-09-1945 DOA: 06/23/2022 PCP: [name], MD    Brief Narrative:   [name] is a 77 y.o. male with past medical history significant for clear-cell renal cell carcinoma with metastasis to liver, HTN, HLD, hypothyroidism, adrenal insufficiency, anemia of chronic medical disease, and recent diagnosis of invasive fungal sinusitis with aspergillosis who presented as a direct admission from the cancer center to [facility] for progressive weakness and fatigue in the setting of hyponatremia.  Patient reports 1 week progressive generalized weakness without associated acute focal complaints.  Patient follows with medical oncology outpatient, Dr. Shirline Frees.  Has been unable to tolerate his chemotherapy for greater than 2 months now as well as dealing with new invasive fungal infection.  Patient was found to have a sodium level of 124 at the oncology clinic.  He denies fever, no chills, no myalgias, no chest pain, no shortness of breath, no palpitations.  On arrival to [facility], temperature 98.1 F, HR 81, RR 16, BP 155/76, SpO2 99% on room air.  WBC 7.0, hemoglobin 8.6, platelets 374.  Sodium 124, potassium 4.0, chloride 92, CO2 24, glucose 106, BUN 14, creatinine 0.84.  AST 23, ALT 34, total Ruben 0.1.  Urinalysis unrevealing.  Urine sodium 137, urine osmolality 477.  Chest x-ray with no acute cardiopulmonary abnormalities.  Assessment & Plan:   Hyponatremia Patient with reported generalized weakness, fatigue over the last week.  Recent labs at oncology clinic with sodium of 124.  Patient admits to poor oral intake.  Recent sodium 141 on 06/11/2022.  Etiology likely multifactorial secondary to hypovolemic hyponatremia in the setting of poor oral intake, adrenal insufficiency and possible medication side effect with voriconazole.  Urine sodium elevated 137, urine osmolality 477.  TSH and uric acid level unrevealing.  Cortisol  level low at 5.2. -- Na 124>126>123>125 -- NS to 194mL/h -- NaCl 1g PO TID -- increased hydrocortisone as below for low cortisol level -- Encourage increased oral intake, supplements TID -- BMP daily  Right clear renal cell carcinoma with liver metastasis Follows with urology, Dr. Marlou Porch and medical oncology Dr. Arbutus Ped outpatient.  Underwent radical right renal nephrectomy February 01, 2015 and omental biopsy July 2019.  Treated initially with immunotherapy that was discontinued due to adrenal insufficiency and panhypopituitarism.  Completed radiation to abdominal lymph nodes in 2021.  Currently on chemotherapy with Cabometyx; but has been off for roughly 2 months per oncology during treatment for invasive fungal infection.  Invasive fungal sinusitis Follows with infectious disease, Dr. Earlene Plater and ENT, Dr. Darl Pikes outpatient.  Recent biopsy with aspergillosis fumigatus.  Complicated by long-term steroid use. -- Posaconazole 300 mg p.o. twice daily  Hypothyroidism TSH 1.830, within normal limits.  Recently seen by endocrinology outpatient. -- Levothyroxine 150 mcg and 175 mcg PO every other day  Adrenal insufficiency Follows with endocrinology outpatient.  A.m. cortisol level low at 5.2. -- Checking aldosterone/renin activity level -- Hydrocortisone increased to 20 mg p.o. qAm and 10 mg PO qPM  Essential hypertension -- Cardizem 240 mg p.o. daily  Anemia of chronic medical disease Hemoglobin 8.6, stable.  Weakness/debility/deconditioning: PT recommended home health on discharge, patient currently declining.   DVT prophylaxis: apixaban (ELIQUIS) tablet 2.5 mg Start: 06/24/22 1700 SCDs Start: 06/23/22 1912 apixaban (ELIQUIS) tablet 2.5 mg    Code Status: Full Code Family Communication: Updated patient's daughter this morning  Disposition Plan:  Level of care: Med-Surg Status is: Inpatient Remains inpatient appropriate because:  IV fluid hydration; needs improvement of  sodium prior to able for discharge home    Consultants:  Palliative care Infectious disease  Procedures:  None  Antimicrobials:  posaconazole   Subjective: Patient seen examined bedside, resting comfortably.  Lying in bed.  No specific complaints other than his overall fatigue.  Sodium slightly improved this morning after increasing hydrocortisone dosing yesterday.  Updated patient's daughter in the hallway this morning regarding care.  Patient with no other specific questions or concerns at this time.  Denies headache, no dizziness, no chest pain, no shortness of breath, no abdominal pain, no focal weakness, no cough/congestion, no fever/chills/night sweats, no nausea/vomiting/diarrhea, no paresthesias.  No acute events overnight per nursing staff.  Objective: Vitals:   06/25/22 0510 06/25/22 1348 06/25/22 2229 06/26/22 0500  BP: (!) 150/67 (!) 146/72 (!) 156/83   Pulse: 77 73 73   Resp: 16 16 17    Temp: 99.6 F (37.6 C) 98.3 F (36.8 C) 98.4 F (36.9 C)   TempSrc: Oral  Oral   SpO2: 96% 95% 99%   Weight:    79.4 kg  Height:        Intake/Output Summary (Last 24 hours) at 06/26/2022 1104 Last data filed at 06/26/2022 0930 Gross per 24 hour  Intake 3373.38 ml  Output 800 ml  Net 2573.38 ml   Filed Weights   06/23/22 2001 06/25/22 0500 06/26/22 0500  Weight: 74.9 kg 78.1 kg 79.4 kg    Examination:  Physical Exam: GEN: NAD, alert and oriented x 3, chronically ill/elderly in appearance HEENT: NCAT, PERRL, EOMI, sclera clear, MMM PULM: CTAB w/o wheezes/crackles, normal respiratory effort, on room air CV: RRR w/o M/G/R GI: abd soft, NTND, NABS, no R/G/M MSK: no peripheral edema, muscle strength globally intact 5/5 bilateral upper/lower extremities NEURO: CN II-XII intact, no focal deficits, sensation to light touch intact PSYCH: normal mood/affect Integumentary: dry/intact, no rashes or wounds    Data Reviewed: I have personally reviewed following labs and imaging  studies  CBC: Recent Labs  Lab 06/23/22 1206 06/24/22 0121  WBC 7.3 7.0  NEUTROABS 5.1 4.3  HGB 9.5* 8.6*  HCT 29.1* 27.5*  MCV 84.6 87.6  PLT 490* 374   Basic Metabolic Panel: Recent Labs  Lab 06/23/22 1206 06/24/22 0121 06/24/22 0845 06/24/22 1805 06/25/22 0355 06/26/22 0353  NA 124* 124* 126* 123* 123* 125*  K 4.1 4.0 4.2  --  3.9 4.0  CL 89* 92* 93*  --  91* 94*  CO2 26 24 26   --  23 23  GLUCOSE 139* 106* 92  --  99 116*  BUN 15 14 11   --  11 8  CREATININE 0.94 0.84 0.83  --  0.84 0.85  CALCIUM 8.5* 8.1* 8.2*  --  7.8* 8.1*  MG  --  1.8  --   --   --   --    GFR: Estimated Creatinine Clearance: 68.2 mL/min (by C-G formula based on SCr of 0.85 mg/dL). Liver Function Tests: Recent Labs  Lab 06/23/22 1206 06/24/22 0121  AST 26 23  ALT 36 34  ALKPHOS 120 99  BILITOT 0.2* 0.1*  PROT 6.7 6.4*  ALBUMIN 2.9* 2.8*   No results for input(s): "LIPASE", "AMYLASE" in the last 168 hours. No results for input(s): "AMMONIA" in the last 168 hours. Coagulation Profile: Recent Labs  Lab 06/24/22 0649  INR 0.9   Cardiac Enzymes: No results for input(s): "CKTOTAL", "CKMB", "CKMBINDEX", "TROPONINI" in the last 168 hours. BNP (last 3  results) No results for input(s): "PROBNP" in the last 8760 hours. HbA1C: No results for input(s): "HGBA1C" in the last 72 hours. CBG: No results for input(s): "GLUCAP" in the last 168 hours. Lipid Profile: No results for input(s): "CHOL", "HDL", "LDLCALC", "TRIG", "CHOLHDL", "LDLDIRECT" in the last 72 hours. Thyroid Function Tests: Recent Labs    06/24/22 0121  TSH 1.830   Anemia Panel: Recent Labs    06/24/22 0649  VITAMINB12 372   Sepsis Labs: No results for input(s): "PROCALCITON", "LATICACIDVEN" in the last 168 hours.  No results found for this or any previous visit (from the past 240 hour(s)).       Radiology Studies: No results found.      Scheduled Meds:  apixaban  2.5 mg Oral BID   diltiazem  240 mg  Oral Daily   ezetimibe  10 mg Oral Once per day on Mon Thu   feeding supplement  237 mL Oral TID BM   hydrocortisone  10 mg Oral QHS   hydrocortisone  20 mg Oral q morning   levothyroxine  150 mcg Oral QODAY   levothyroxine  175 mcg Oral QODAY   mupirocin ointment  1 Application Topical Daily   OLANZapine  5 mg Oral QHS   posaconazole  300 mg Oral Q breakfast   sodium chloride  1 g Oral TID WC   Continuous Infusions:  sodium chloride 125 mL/hr at 06/26/22 0553     LOS: 2 days    Time spent: 53 minutes spent on chart review, discussion with nursing staff, consultants, updating family and interview/physical exam; more than 50% of that time was spent in counseling and/or coordination of care.    Alvira Philips [place], DO [facility] Available via Epic secure chat 7am-7pm After these hours, please refer to coverage provider listed on [email or web address] 06/26/2022, 11:04 AM

## 2022-06-26 NOTE — Progress Notes (Signed)
Physical Therapy Treatment Patient Details Name: [name] MRN: [id number] DOB: 1945-12-22 Today's Date: 06/26/2022   History of Present Illness 77 y.o. male admitted to [facility] on 06/23/2022 as a direct admission from outpatient oncology office with generalized weakness after presenting from home to oncology clinic complaining of generalized weakness.Found to have acute hyponatremia. PHMx:metastatic clear-cell renal cancer, with mets to the liver, essential pretension, hyperlipidemia, acquired hypothyroidism, anemia of chronic disease associated baseline hemoglobin range 9-12.    PT Comments    Pt seen for PT tx with pt agreeable. Pt demonstrates improved activity tolerance & balance as pt is able to ambulate 2 laps around entire unit without AD & supervision on this date. Pt performs STS without BUE support for BLE strengthening. Pt would benefit from ongoing acute PT services to address strengthening & high level balance training.    Recommendations for follow up therapy are one component of a multi-disciplinary discharge planning process, led by the attending physician.  Recommendations may be updated based on patient status, additional functional criteria and insurance authorization.  Follow Up Recommendations       Assistance Recommended at Discharge Intermittent Supervision/Assistance  Patient can return home with the following Assist for transportation;Help with stairs or ramp for entrance;Assistance with cooking/housework   Equipment Recommendations  None recommended by PT    Recommendations for Other Services       Precautions / Restrictions Precautions Precautions: Fall Restrictions Weight Bearing Restrictions: No     Mobility  Bed Mobility Overal bed mobility: Independent                  Transfers Overall transfer level: Independent Equipment used: None Transfers: Sit to/from [employer] transfer comment: STS from EOB with  UE support with independence, STS from EOB without BUE support with supervision    Ambulation/Gait Ambulation/Gait assistance: Supervision Gait Distance (Feet): 400 Feet (2 laps around unit without AD) Assistive device: None Gait Pattern/deviations: Decreased step length - right, Decreased step length - left, Decreased stride length, Decreased dorsiflexion - right, Decreased dorsiflexion - left Gait velocity: decreased     General Gait Details: Slightly decreased hip flexion BLE during swing phase. Education re: need to increase reciprocal arm swing as pt initially holding BUE very stiff by his sides. Pt engaged in stop/start & gait with head turns without LOB.   Stairs             Wheelchair Mobility    Modified Rankin (Stroke Patients Only)       Balance Overall balance assessment: Mild deficits observed, not formally tested   Sitting balance-Leahy Scale: Normal     Standing balance support: During functional activity, No upper extremity supported Standing balance-Leahy Scale: Fair                              Cognition Arousal/Alertness: Awake/alert Behavior During Therapy: WFL for tasks assessed/performed, Flat affect Overall Cognitive Status: Within Functional Limits for tasks assessed                                          Exercises Other Exercises Other Exercises: Pt performed 10x STS from EOB without BUE support with cuing to scoot out to edge of seat to limit posterior lean on EOB  with BLE with fair<>poor return demo. Activity focused on BLE strengthening & endurance training.    General Comments        Pertinent Vitals/Pain Pain Assessment Pain Assessment: No/denies pain    Home Living                          Prior Function            PT Goals (current goals can now be found in the care plan section) Acute Rehab PT Goals Patient Stated Goal: feel better PT Goal Formulation: With  patient/family Time For Goal Achievement: 07/08/22 Potential to Achieve Goals: Good Progress towards PT goals: Progressing toward goals    Frequency    Min 1X/week      PT Plan Current plan remains appropriate    Co-evaluation              AM-PAC PT "6 Clicks" Mobility   Outcome Measure  Help needed turning from your back to your side while in a flat bed without using bedrails?: None Help needed moving from lying on your back to sitting on the side of a flat bed without using bedrails?: None Help needed moving to and from a bed to a chair (including a wheelchair)?: None Help needed standing up from a chair using your arms (e.g., wheelchair or bedside chair)?: None Help needed to walk in hospital room?: None Help needed climbing 3-5 steps with a railing? : None 6 Click Score: 24    End of Session   Activity Tolerance: Patient tolerated treatment well Patient left: in bed;with call bell/phone within reach   PT Visit Diagnosis: Unsteadiness on feet (R26.81);Muscle weakness (generalized) (M62.81)     Time: 8119-1478 PT Time Calculation (min) (ACUTE ONLY): 11 min  Charges:  $Therapeutic Activity: 8-22 mins                     Aleda Grana, PT, DPT 06/26/22, 3:35 PM  Sandi Mariscal 06/26/2022, 3:34 PM

## 2022-06-26 NOTE — Progress Notes (Signed)
Daily Progress Note   Patient Name: [name]       Date: 06/26/2022 DOB: 1945/05/02  Age: 77 y.o. MRN#: [id number] Attending Physician: [place], Eric J, DO Primary Care Physician: [name], MD Admit Date: 06/23/2022 Length of Stay: 2 days  Reason for Consultation/Follow-up: Establishing goals of care and Symptom Management  Subjective:   CC: Patient wanting to get out of the hospital.  Following up regarding complex medical decision making and symptom management.   Subjective:  Review EMR prior to seeing patient. In past 24 hours at time of EMR review, patient had received Ativan 1mg  x 3 doses.  Patient received olanzapine 5 mg last evening.  Reviewed note from ID today.  Presented to bedside to meet with patient.  Patient seen laying comfortably in bed.  No family present at bedside.  Followed up regarding patient's symptom burden today.  Patient notes he feels okay and is anxious to go home.  Patient notes he did not sleep well last night.  Patient unable to tell if Ativan has been helping with his anxiety.  Explored more about his sleep at night to which patient notes he feels he just cannot be still and so cannot fall asleep.  Patient feels that once he is asleep, he can get good rest.  Discussed changing from Ativan to clonazepam to hopefully get more long-lasting effects for anxiety and would start tonight to hopefully assist with sleep management.  Patient agreeing with this plan.  All questions answered at that time.  Noted palliative medicine team will continue to follow along.  Review of Systems Anxiety Objective:   Vital Signs:  BP (!) 156/83 (BP Location: Right Arm)   Pulse 73   Temp 98.4 F (36.9 C) (Oral)   Resp 17   Ht 5\' 8"  (1.727 m)   Wt 79.4 kg   SpO2 99%   BMI 26.62 kg/m   Physical Exam: General: NAD, laying in bed, chronically ill-appearing, awake Eyes: No drainage noted HENT: moist mucous membranes, poor dentition Cardiovascular:  RRR Respiratory: no increased work of breathing noted, not in respiratory distress Abdomen: not distended Neuro: A&Ox4, following commands easily Psych: appropriately answers all questions  Imaging:  I personally reviewed recent imaging.   Assessment & Plan:   Assessment: Patient is a 77 year old male with a past medical history of clear-cell renal cell carcinoma with metastatic disease to liver, hypertension, hyperlipidemia, hypothyroidism, adrenal insufficiency, anemia of chronic medical disease, and recent diagnosis of invasive fungal sinusitis with aspergillosis who was admitted on 06/23/2022 as a direct admission from cancer center for progressive weakness and fatigue in the setting of hyponatremia.  Since admission, patient has received medical management for hyponatremia as well as continued antibiotic management for invasive fungal sinusitis.  Out of medicine team consulted to assist with complex medical decision making. Of note patient seen by outpatient palliative medicine provider at [facility].  Recommendations/Plan: # Complex medical decision making/goals of care:    -Patient continuing with appropriate medical interventions to assist with cancer and infection management.                 -  Code Status: Full Code    # Symptom management                -Adrenal insufficiency                  -Hospitalist increased hydrocortisone dose on 5/10 due to low cortisol level                  -  Poor appetite and sleep, likely multifactorial                               -Continue olanzapine to 5mg  qhs. Continue to titrate based on response.    -Anxiety   -Stop ativan   -Start klonopin 0.5mg  BID tonight   # Psycho-social/Spiritual Support:  - Support System: daughters  # Discharge Planning: Home with Palliative Services at [facility]  Thank you for allowing the palliative care team to participate in the care Oluwadamilola Leonor Liv.  Alvester Morin, DO Palliative Care Provider PMT # [phone]  If  patient remains symptomatic despite maximum doses, please call PMT at [phone] between 0700 and 1900. Outside of these hours, please call attending, as PMT does not have night coverage.  *Please note that this is a verbal dictation therefore any spelling or grammatical errors are due to the "[employer]" system interpretation.

## 2022-06-27 DIAGNOSIS — R531 Weakness: Secondary | ICD-10-CM | POA: Diagnosis not present

## 2022-06-27 LAB — BASIC METABOLIC PANEL
Anion gap: 7 (ref 5–15)
BUN: 10 mg/dL (ref 8–23)
CO2: 25 mmol/L (ref 22–32)
Calcium: 8.1 mg/dL — ABNORMAL LOW (ref 8.9–10.3)
Chloride: 103 mmol/L (ref 98–111)
Creatinine, Ser: 0.96 mg/dL (ref 0.61–1.24)
GFR, Estimated: >60 mL/min (ref >60)
Glucose, Bld: 128 mg/dL — ABNORMAL HIGH (ref 70–99)
Potassium: 3.7 mmol/L (ref 3.5–5.1)
Sodium: 135 mmol/L (ref 135–145)

## 2022-06-27 NOTE — Progress Notes (Deleted)
PROGRESS NOTE    [name]  ZOX:[id number] DOB: Dec 18, 1945 DOA: 06/23/2022 PCP: [name], MD    Brief Narrative:   [name] is a 77 y.o. male with past medical history significant for clear-cell renal cell carcinoma with metastasis to liver, HTN, HLD, hypothyroidism, adrenal insufficiency, anemia of chronic medical disease, and recent diagnosis of invasive fungal sinusitis with aspergillosis who presented as a direct admission from the cancer center to [facility] for progressive weakness and fatigue in the setting of hyponatremia.  Patient reports 1 week progressive generalized weakness without associated acute focal complaints.  Patient follows with medical oncology outpatient, Dr. Shirline Frees.  Has been unable to tolerate his chemotherapy for greater than 2 months now as well as dealing with new invasive fungal infection.  Patient was found to have a sodium level of 124 at the oncology clinic.  He denies fever, no chills, no myalgias, no chest pain, no shortness of breath, no palpitations.  On arrival to [facility], temperature 98.1 F, HR 81, RR 16, BP 155/76, SpO2 99% on room air.  WBC 7.0, hemoglobin 8.6, platelets 374.  Sodium 124, potassium 4.0, chloride 92, CO2 24, glucose 106, BUN 14, creatinine 0.84.  AST 23, ALT 34, total Ruben 0.1.  Urinalysis unrevealing.  Urine sodium 137, urine osmolality 477.  Chest x-ray with no acute cardiopulmonary abnormalities.  Assessment & Plan:   Hyponatremia Patient with reported generalized weakness, fatigue over the last week.  Recent labs at oncology clinic with sodium of 124.  Patient admits to poor oral intake.  Recent sodium 141 on 06/11/2022.  Etiology likely multifactorial secondary to hypovolemic hyponatremia in the setting of poor oral intake, adrenal insufficiency and possible medication side effect with voriconazole.  Urine sodium elevated 137, urine osmolality 477.  TSH and uric acid level unrevealing.  Cortisol  level low at 5.2. -- Na 124>126>123>125>135 -- Discontinue normal saline and sodium chloride tablets today -- increased hydrocortisone as below for low cortisol level -- Encourage increased oral intake, supplements TID -- BMP daily  Right clear renal cell carcinoma with liver metastasis Follows with urology, Dr. Marlou Porch and medical oncology Dr. Arbutus Ped outpatient.  Underwent radical right renal nephrectomy February 01, 2015 and omental biopsy July 2019.  Treated initially with immunotherapy that was discontinued due to adrenal insufficiency and panhypopituitarism.  Completed radiation to abdominal lymph nodes in 2021.  Currently on chemotherapy with Cabometyx; but has been off for roughly 2 months per oncology during treatment for invasive fungal infection.  Invasive fungal sinusitis Follows with infectious disease, Dr. Earlene Plater and ENT, Dr. Darl Pikes outpatient.  Recent biopsy with aspergillosis fumigatus.  Complicated by long-term steroid use. -- Posaconazole 300 mg p.o. twice daily  Hypothyroidism TSH 1.830, within normal limits.  Recently seen by endocrinology outpatient. -- Levothyroxine 150 mcg and 175 mcg PO every other day  Adrenal insufficiency Follows with endocrinology outpatient.  A.m. cortisol level low at 5.2. -- Aldosterone/renin activity level: pending -- Hydrocortisone increased to 20 mg p.o. qAm and 10 mg PO qPM  Essential hypertension -- Cardizem 240 mg p.o. daily  Anemia of chronic medical disease Hemoglobin 8.6, stable.  Weakness/debility/deconditioning: PT recommended home health on discharge, patient currently declining.   DVT prophylaxis: apixaban (ELIQUIS) tablet 2.5 mg Start: 06/24/22 1700 SCDs Start: 06/23/22 1912 apixaban (ELIQUIS) tablet 2.5 mg    Code Status: Full Code Family Communication: No family present at bedside this morning  Disposition Plan:  Level of care: Med-Surg Status is: Inpatient Remains inpatient  appropriate because: Anticipate  discharge home tomorrow if sodium continues to uptrend    Consultants:  Palliative care Infectious disease  Procedures:  None  Antimicrobials:  posaconazole   Subjective: Patient seen examined bedside, resting comfortably.  Sitting at edge of bed eating breakfast.  Reports his fatigue and weakness has much improved.  Sodium now up to 135.  Discontinue IV fluids and sodium chloride tabs today.  Discussed anticipate discharge home tomorrow if sodium continues to improve.  Patient with no other specific questions or concerns at this time.  Denies headache, no dizziness, no chest pain, no shortness of breath, no abdominal pain, no focal weakness, no cough/congestion, no fever/chills/night sweats, no nausea/vomiting/diarrhea, no paresthesias.  No acute events overnight per nursing staff.  Objective: Vitals:   06/26/22 1307 06/26/22 2131 06/27/22 0541 06/27/22 0546  BP: (!) 149/74 (!) 150/68 (!) 151/69   Pulse: 73 70 77   Resp: 16 17 17    Temp: 97.8 F (36.6 C) 98.7 F (37.1 C) 98.4 F (36.9 C)   TempSrc: Oral Oral    SpO2: 98% 99% 96%   Weight:    78.3 kg  Height:        Intake/Output Summary (Last 24 hours) at 06/27/2022 1338 Last data filed at 06/27/2022 1126 Gross per 24 hour  Intake 1895.2 ml  Output 1625 ml  Net 270.2 ml   Filed Weights   06/25/22 0500 06/26/22 0500 06/27/22 0546  Weight: 78.1 kg 79.4 kg 78.3 kg    Examination:  Physical Exam: GEN: NAD, alert and oriented x 3, chronically ill/elderly in appearance HEENT: NCAT, PERRL, EOMI, sclera clear, MMM PULM: CTAB w/o wheezes/crackles, normal respiratory effort, on room air CV: RRR w/o M/G/R GI: abd soft, NTND, NABS, no R/G/M MSK: no peripheral edema, muscle strength globally intact 5/5 bilateral upper/lower extremities NEURO: CN II-XII intact, no focal deficits, sensation to light touch intact PSYCH: normal mood/affect Integumentary: dry/intact, no rashes or wounds    Data Reviewed: I have personally  reviewed following labs and imaging studies  CBC: Recent Labs  Lab 06/23/22 1206 06/24/22 0121  WBC 7.3 7.0  NEUTROABS 5.1 4.3  HGB 9.5* 8.6*  HCT 29.1* 27.5*  MCV 84.6 87.6  PLT 490* 374   Basic Metabolic Panel: Recent Labs  Lab 06/24/22 0121 06/24/22 0845 06/24/22 1805 06/25/22 0355 06/26/22 0353 06/27/22 0344  NA 124* 126* 123* 123* 125* 135  K 4.0 4.2  --  3.9 4.0 3.7  CL 92* 93*  --  91* 94* 103  CO2 24 26  --  23 23 25   GLUCOSE 106* 92  --  99 116* 128*  BUN 14 11  --  11 8 10   CREATININE 0.84 0.83  --  0.84 0.85 0.96  CALCIUM 8.1* 8.2*  --  7.8* 8.1* 8.1*  MG 1.8  --   --   --   --   --    GFR: Estimated Creatinine Clearance: 60.4 mL/min (by C-G formula based on SCr of 0.96 mg/dL). Liver Function Tests: Recent Labs  Lab 06/23/22 1206 06/24/22 0121  AST 26 23  ALT 36 34  ALKPHOS 120 99  BILITOT 0.2* 0.1*  PROT 6.7 6.4*  ALBUMIN 2.9* 2.8*   No results for input(s): "LIPASE", "AMYLASE" in the last 168 hours. No results for input(s): "AMMONIA" in the last 168 hours. Coagulation Profile: Recent Labs  Lab 06/24/22 0649  INR 0.9   Cardiac Enzymes: No results for input(s): "CKTOTAL", "CKMB", "CKMBINDEX", "TROPONINI" in the  last 168 hours. BNP (last 3 results) No results for input(s): "PROBNP" in the last 8760 hours. HbA1C: No results for input(s): "HGBA1C" in the last 72 hours. CBG: No results for input(s): "GLUCAP" in the last 168 hours. Lipid Profile: No results for input(s): "CHOL", "HDL", "LDLCALC", "TRIG", "CHOLHDL", "LDLDIRECT" in the last 72 hours. Thyroid Function Tests: No results for input(s): "TSH", "T4TOTAL", "FREET4", "T3FREE", "THYROIDAB" in the last 72 hours.  Anemia Panel: No results for input(s): "VITAMINB12", "FOLATE", "FERRITIN", "TIBC", "IRON", "RETICCTPCT" in the last 72 hours.  Sepsis Labs: No results for input(s): "PROCALCITON", "LATICACIDVEN" in the last 168 hours.  No results found for this or any previous visit (from  the past 240 hour(s)).       Radiology Studies: No results found.      Scheduled Meds:  apixaban  2.5 mg Oral BID   clonazepam  0.5 mg Oral BID   diltiazem  240 mg Oral Daily   ezetimibe  10 mg Oral Once per day on Mon Thu   feeding supplement  237 mL Oral TID BM   hydrocortisone  10 mg Oral QHS   hydrocortisone  20 mg Oral q morning   levothyroxine  150 mcg Oral QODAY   levothyroxine  175 mcg Oral QODAY   mupirocin ointment  1 Application Topical Daily   OLANZapine  5 mg Oral QHS   posaconazole  300 mg Oral Q breakfast   Continuous Infusions:     LOS: 3 days    Time spent: 53 minutes spent on chart review, discussion with nursing staff, consultants, updating family and interview/physical exam; more than 50% of that time was spent in counseling and/or coordination of care.    Alvira Philips [place], DO [facility] Available via Epic secure chat 7am-7pm After these hours, please refer to coverage provider listed on [email or web address] 06/27/2022, 1:38 PM

## 2022-06-27 NOTE — Progress Notes (Signed)
PROGRESS NOTE    [name]  ZOX:[id number] DOB: [birth year] DOA: 06/23/2022 PCP: [name], MD    Brief Narrative:   [name] is a 77 y.o. male with past medical history significant for clear-cell renal cell carcinoma with metastasis to liver, HTN, HLD, hypothyroidism, adrenal insufficiency, anemia of chronic medical disease, and recent diagnosis of invasive fungal sinusitis with aspergillosis who presented as a direct admission from the cancer center to [facility] for progressive weakness and fatigue in the setting of hyponatremia.  Patient reports 1 week progressive generalized weakness without associated acute focal complaints.  Patient follows with medical oncology outpatient, Dr. Shirline Frees.  Has been unable to tolerate his chemotherapy for greater than 2 months now as well as dealing with new invasive fungal infection.  Patient was found to have a sodium level of 124 at the oncology clinic.  He denies fever, no chills, no myalgias, no chest pain, no shortness of breath, no palpitations.  On arrival to [facility], temperature 98.1 F, HR 81, RR 16, BP 155/76, SpO2 99% on room air.  WBC 7.0, hemoglobin 8.6, platelets 374.  Sodium 124, potassium 4.0, chloride 92, CO2 24, glucose 106, BUN 14, creatinine 0.84.  AST 23, ALT 34, total Ruben 0.1.  Urinalysis unrevealing.  Urine sodium 137, urine osmolality 477.  Chest x-ray with no acute cardiopulmonary abnormalities.  Assessment & Plan:   Hyponatremia Patient with reported generalized weakness, fatigue over the last week.  Recent labs at oncology clinic with sodium of 124.  Patient admits to poor oral intake.  Recent sodium 141 on 06/11/2022.  Etiology likely multifactorial secondary to hypovolemic hyponatremia in the setting of poor oral intake, adrenal insufficiency and possible medication side effect with voriconazole.  Urine sodium elevated 137, urine osmolality 477.  TSH and uric acid level unrevealing.  Cortisol  level low at 5.2. -- Na 124>126>123>125>135 -- Discontinue normal saline and sodium chloride tablets today -- increased hydrocortisone as below for low cortisol level -- Encourage increased oral intake, supplements TID -- BMP daily  Right clear renal cell carcinoma with liver metastasis Follows with urology, Dr. Marlou Porch and medical oncology Dr. Arbutus Ped outpatient.  Underwent radical right renal nephrectomy February 01, 2015 and omental biopsy July 2019.  Treated initially with immunotherapy that was discontinued due to adrenal insufficiency and panhypopituitarism.  Completed radiation to abdominal lymph nodes in 2021.  Currently on chemotherapy with Cabometyx; but has been off for roughly 2 months per oncology during treatment for invasive fungal infection.  Invasive fungal sinusitis Follows with infectious disease, Dr. Earlene Plater and ENT, Dr. Darl Pikes outpatient.  Recent biopsy with aspergillosis fumigatus.  Complicated by long-term steroid use. -- Posaconazole 300 mg p.o. twice daily  Hypothyroidism TSH 1.830, within normal limits.  Recently seen by endocrinology outpatient. -- Levothyroxine 150 mcg and 175 mcg PO every other day  Adrenal insufficiency Follows with endocrinology outpatient.  A.m. cortisol level low at 5.2. -- Aldosterone/renin activity level: pending -- Hydrocortisone increased to 20 mg p.o. qAm and 10 mg PO qPM  Essential hypertension -- Cardizem 240 mg p.o. daily  Anemia of chronic medical disease Hemoglobin 8.6, stable.  Weakness/debility/deconditioning: PT recommended home health on discharge, patient currently declining.   DVT prophylaxis: apixaban (ELIQUIS) tablet 2.5 mg Start: 06/24/22 1700 SCDs Start: 06/23/22 1912 apixaban (ELIQUIS) tablet 2.5 mg    Code Status: Full Code Family Communication: No family present at bedside this morning  Disposition Plan:  Level of care: Med-Surg Status is: Inpatient Remains inpatient  appropriate because: Anticipate  discharge home tomorrow if sodium continues to uptrend    Consultants:  Palliative care Infectious disease  Procedures:  None  Antimicrobials:  posaconazole   Subjective: Patient seen examined bedside, resting comfortably.  Sitting at edge of bed eating breakfast.  Reports his fatigue and weakness has much improved.  Sodium now up to 135.  Discontinue IV fluids and sodium chloride tabs today.  Discussed anticipate discharge home tomorrow if sodium continues to improve.  Patient with no other specific questions or concerns at this time.  Denies headache, no dizziness, no chest pain, no shortness of breath, no abdominal pain, no focal weakness, no cough/congestion, no fever/chills/night sweats, no nausea/vomiting/diarrhea, no paresthesias.  No acute events overnight per nursing staff.  Objective: Vitals:   06/26/22 1307 06/26/22 2131 06/27/22 0541 06/27/22 0546  BP: (!) 149/74 (!) 150/68 (!) 151/69   Pulse: 73 70 77   Resp: 16 17 17    Temp: 97.8 F (36.6 C) 98.7 F (37.1 C) 98.4 F (36.9 C)   TempSrc: Oral Oral    SpO2: 98% 99% 96%   Weight:    78.3 kg  Height:        Intake/Output Summary (Last 24 hours) at 06/27/2022 1333 Last data filed at 06/27/2022 1126 Gross per 24 hour  Intake 1895.2 ml  Output 1625 ml  Net 270.2 ml   Filed Weights   06/25/22 0500 06/26/22 0500 06/27/22 0546  Weight: 78.1 kg 79.4 kg 78.3 kg    Examination:  Physical Exam: GEN: NAD, alert and oriented x 3, chronically ill/elderly in appearance HEENT: NCAT, PERRL, EOMI, sclera clear, MMM PULM: CTAB w/o wheezes/crackles, normal respiratory effort, on room air CV: RRR w/o M/G/R GI: abd soft, NTND, NABS, no R/G/M MSK: no peripheral edema, muscle strength globally intact 5/5 bilateral upper/lower extremities NEURO: CN II-XII intact, no focal deficits, sensation to light touch intact PSYCH: normal mood/affect Integumentary: dry/intact, no rashes or wounds    Data Reviewed: I have personally  reviewed following labs and imaging studies  CBC: Recent Labs  Lab 06/23/22 1206 06/24/22 0121  WBC 7.3 7.0  NEUTROABS 5.1 4.3  HGB 9.5* 8.6*  HCT 29.1* 27.5*  MCV 84.6 87.6  PLT 490* 374   Basic Metabolic Panel: Recent Labs  Lab 06/24/22 0121 06/24/22 0845 06/24/22 1805 06/25/22 0355 06/26/22 0353 06/27/22 0344  NA 124* 126* 123* 123* 125* 135  K 4.0 4.2  --  3.9 4.0 3.7  CL 92* 93*  --  91* 94* 103  CO2 24 26  --  23 23 25   GLUCOSE 106* 92  --  99 116* 128*  BUN 14 11  --  11 8 10   CREATININE 0.84 0.83  --  0.84 0.85 0.96  CALCIUM 8.1* 8.2*  --  7.8* 8.1* 8.1*  MG 1.8  --   --   --   --   --    GFR: Estimated Creatinine Clearance: 60.4 mL/min (by C-G formula based on SCr of 0.96 mg/dL). Liver Function Tests: Recent Labs  Lab 06/23/22 1206 06/24/22 0121  AST 26 23  ALT 36 34  ALKPHOS 120 99  BILITOT 0.2* 0.1*  PROT 6.7 6.4*  ALBUMIN 2.9* 2.8*   No results for input(s): "LIPASE", "AMYLASE" in the last 168 hours. No results for input(s): "AMMONIA" in the last 168 hours. Coagulation Profile: Recent Labs  Lab 06/24/22 0649  INR 0.9   Cardiac Enzymes: No results for input(s): "CKTOTAL", "CKMB", "CKMBINDEX", "TROPONINI" in the  last 168 hours. BNP (last 3 results) No results for input(s): "PROBNP" in the last 8760 hours. HbA1C: No results for input(s): "HGBA1C" in the last 72 hours. CBG: No results for input(s): "GLUCAP" in the last 168 hours. Lipid Profile: No results for input(s): "CHOL", "HDL", "LDLCALC", "TRIG", "CHOLHDL", "LDLDIRECT" in the last 72 hours. Thyroid Function Tests: No results for input(s): "TSH", "T4TOTAL", "FREET4", "T3FREE", "THYROIDAB" in the last 72 hours.  Anemia Panel: No results for input(s): "VITAMINB12", "FOLATE", "FERRITIN", "TIBC", "IRON", "RETICCTPCT" in the last 72 hours.  Sepsis Labs: No results for input(s): "PROCALCITON", "LATICACIDVEN" in the last 168 hours.  No results found for this or any previous visit (from  the past 240 hour(s)).       Radiology Studies: No results found.      Scheduled Meds:  apixaban  2.5 mg Oral BID   clonazepam  0.5 mg Oral BID   diltiazem  240 mg Oral Daily   ezetimibe  10 mg Oral Once per day on Mon Thu   feeding supplement  237 mL Oral TID BM   hydrocortisone  10 mg Oral QHS   hydrocortisone  20 mg Oral q morning   levothyroxine  150 mcg Oral QODAY   levothyroxine  175 mcg Oral QODAY   mupirocin ointment  1 Application Topical Daily   OLANZapine  5 mg Oral QHS   posaconazole  300 mg Oral Q breakfast   Continuous Infusions:     LOS: 3 days    Time spent: 53 minutes spent on chart review, discussion with nursing staff, consultants, updating family and interview/physical exam; more than 50% of that time was spent in counseling and/or coordination of care.    Alvira Philips [place], DO [facility] Available via Epic secure chat 7am-7pm After these hours, please refer to coverage provider listed on [email or web address] 06/27/2022, 1:33 PM

## 2022-06-27 NOTE — Progress Notes (Signed)
Daily Progress Note   Patient Name: [name]       Date: 06/27/2022 DOB: 10/10/1945  Age: 77 y.o. MRN#: [id number] Attending Physician: [place], Eric J, DO Primary Care Physician: [name], MD Admit Date: 06/23/2022 Length of Stay: 3 days  Reason for Consultation/Follow-up: Establishing goals of care and Symptom Management  Subjective:   CC: Patient felling better today and slept well.  Following up regarding complex medical decision making and symptom management.   Subjective:  Review EMR prior to seeing patient.  Started on Klonopin 0.5 mg twice daily yesterday.  Continues to take Zyprexa 5 mg nightly.  Sodium improved today to 135.  Presented to bedside to meet with patient.  Discussed care with bedside RN who was present as well.  Hoping patient can be discharged tomorrow as long as sodium stays stable. Inquired about patient's symptom burden at this time.  Patient notes that he is feeling better and hopeful to go home soon.  Discussed plan as per hospitalist for likely discharge tomorrow.  Patient notes that his anxiety is better since the Klonopin has been started.  Patient also notes that he slept well last night which helped with his mood.  Discussed continuation of medications that have been adjusted.  Patient agreeing with this plan.  All questions answered at that time.  Noted palliative medicine team will continue to follow with patient's medical journey.  Review of Systems Anxiety improved, sleep improved Objective:   Vital Signs:  BP (!) 151/69 (BP Location: Right Arm)   Pulse 77   Temp 98.4 F (36.9 C)   Resp 17   Ht 5\' 8"  (1.727 m)   Wt 78.3 kg   SpO2 96%   BMI 26.25 kg/m   Physical Exam: General: NAD, laying in bed, chronically ill-appearing, awake Eyes: No drainage noted HENT: moist mucous membranes, poor dentition Cardiovascular: RRR Respiratory: no increased work of breathing noted, not in respiratory distress Abdomen: not distended Neuro:  A&Ox4, following commands easily Psych: appropriately answers all questions  Imaging:  I personally reviewed recent imaging.   Assessment & Plan:   Assessment: Patient is a 77 year old male with a past medical history of clear-cell renal cell carcinoma with metastatic disease to liver, hypertension, hyperlipidemia, hypothyroidism, adrenal insufficiency, anemia of chronic medical disease, and recent diagnosis of invasive fungal sinusitis with aspergillosis who was admitted on 06/23/2022 as a direct admission from cancer center for progressive weakness and fatigue in the setting of hyponatremia.  Since admission, patient has received medical management for hyponatremia as well as continued antibiotic management for invasive fungal sinusitis.  Out of medicine team consulted to assist with complex medical decision making. Of note patient seen by outpatient palliative medicine provider at [facility].  Recommendations/Plan: # Complex medical decision making/goals of care:    -Patient continuing with appropriate medical interventions to assist with cancer and infection management.                 -  Code Status: Full Code    # Symptom management                -Adrenal insufficiency                  -Hospitalist had increased hydrocortisone dose on 5/10 due to low cortisol level                  -Poor appetite and sleep, likely multifactorial                               -  Continue olanzapine to 5mg  qhs. Continue to titrate based on response.    -Klonopin as per below in anxiety    -Anxiety   -Continue klonopin 0.5mg  BID tonight   # Psycho-social/Spiritual Support:  - Support System: daughters  # Discharge Planning: Home with Palliative Services at [facility]  Thank you for allowing the palliative care team to participate in the care Cayleb Leonor Liv.  Alvester Morin, DO Palliative Care Provider PMT # [phone]  If patient remains symptomatic despite maximum doses, please call PMT at [phone]  between 0700 and 1900. Outside of these hours, please call attending, as PMT does not have night coverage.  *Please note that this is a verbal dictation therefore any spelling or grammatical errors are due to the "[employer] One" system interpretation.

## 2022-06-28 DIAGNOSIS — R531 Weakness: Secondary | ICD-10-CM | POA: Diagnosis not present

## 2022-06-28 LAB — BASIC METABOLIC PANEL
Anion gap: 9 (ref 5–15)
BUN: 16 mg/dL (ref 8–23)
CO2: 24 mmol/L (ref 22–32)
Calcium: 8.2 mg/dL — ABNORMAL LOW (ref 8.9–10.3)
Chloride: 102 mmol/L (ref 98–111)
Creatinine, Ser: 0.89 mg/dL (ref 0.61–1.24)
GFR, Estimated: >60 mL/min (ref >60)
Glucose, Bld: 136 mg/dL — ABNORMAL HIGH (ref 70–99)
Potassium: 4 mmol/L (ref 3.5–5.1)
Sodium: 135 mmol/L (ref 135–145)

## 2022-06-28 MED ORDER — CLONAZEPAM 0.5 MG PO TBDP: 0.5000 mg | ORAL_TABLET | Freq: Two times a day (BID) | ORAL | 2 refills | Status: DC | PRN

## 2022-06-28 MED ORDER — HYDROCORTISONE 20 MG PO TABS: 20.0000 mg | ORAL_TABLET | Freq: Every morning | ORAL | 0 refills | Status: AC

## 2022-06-28 MED ORDER — HYDROCORTISONE 10 MG PO TABS: 10.0000 mg | ORAL_TABLET | Freq: Every day | ORAL | 0 refills | Status: DC

## 2022-06-28 MED ORDER — OLANZAPINE 5 MG PO TABS: 5.0000 mg | ORAL_TABLET | Freq: Every day | ORAL | 0 refills | Status: DC

## 2022-06-28 MED ORDER — POSACONAZOLE 100 MG PO TBEC: 300.0000 mg | DELAYED_RELEASE_TABLET | Freq: Every day | ORAL | 2 refills | Status: AC

## 2022-06-28 NOTE — Discharge Summary (Signed)
Physician Discharge Summary  [name] RUE:[id number] DOB: 20-Mar-1945 DOA: 06/23/2022  PCP: [name], MD  Admit date: 06/23/2022 Discharge date: 06/28/2022  Admitted From: Home Disposition: Home  Recommendations for Outpatient Follow-up:  Follow up with PCP in 1-2 weeks Follow-up with endocrinology, Dr. Elvera Lennox in 1-2 weeks Follow-up with infectious disease, Dr. Earlene Plater as scheduled Hydrocortisone increased to 20 mg p.o. every morning and 10 mg p.o. every afternoon for low a.m. cortisol of 5.2 with associated weakness, fatigue Voriconazole changed to posaconazole per ID Aldosterone/renin activity level: pending at time of discharge Please obtain BMP in one week to reassess sodium level  Home Health: Declined home health PT Equipment/Devices: None  Discharge Condition: Stable CODE STATUS: Full code Diet recommendation: Regular diet  History of present illness:  [name] is a 77 y.o. male with past medical history significant for clear-cell renal cell carcinoma with metastasis to liver, HTN, HLD, hypothyroidism, adrenal insufficiency, anemia of chronic medical disease, and recent diagnosis of invasive fungal sinusitis with aspergillosis who presented as a direct admission from the cancer center to [facility] for progressive weakness and fatigue in the setting of hyponatremia.  Patient reports 1 week progressive generalized weakness without associated acute focal complaints.  Patient follows with medical oncology outpatient, Dr. Shirline Frees.  Has been unable to tolerate his chemotherapy for greater than 2 months now as well as dealing with new invasive fungal infection.  Patient was found to have a sodium level of 124 at the oncology clinic.  He denies fever, no chills, no myalgias, no chest pain, no shortness of breath, no palpitations.   On arrival to [facility], temperature 98.1 F, HR 81, RR 16, BP 155/76, SpO2 99% on room air.  WBC 7.0, hemoglobin 8.6,  platelets 374.  Sodium 124, potassium 4.0, chloride 92, CO2 24, glucose 106, BUN 14, creatinine 0.84.  AST 23, ALT 34, total Ruben 0.1.  Urinalysis unrevealing.  Urine sodium 137, urine osmolality 477.  Chest x-ray with no acute cardiopulmonary abnormalities.  Hospital course:  Hyponatremia Patient with reported generalized weakness, fatigue over the last week.  Recent labs at oncology clinic with sodium of 124.  Patient admits to poor oral intake.  Recent sodium 141 on 06/11/2022.  Etiology likely multifactorial secondary to hypovolemic hyponatremia in the setting of poor oral intake, adrenal insufficiency and possible medication side effect with voriconazole.  Urine sodium elevated 137, urine osmolality 477.  TSH and uric acid level unrevealing.  Cortisol level low at 5.2.  She was started on normal saline, sodium chloride tabs and hydrocortisone was increased.  Sodium improved to 135 at time of discharge.  Commend repeat BMP 1 week.   Right clear renal cell carcinoma with liver metastasis Follows with urology, Dr. Marlou Porch and medical oncology Dr. Arbutus Ped outpatient.  Underwent radical right renal nephrectomy February 01, 2015 and omental biopsy July 2019.  Treated initially with immunotherapy that was discontinued due to adrenal insufficiency and panhypopituitarism.  Completed radiation to abdominal lymph nodes in 2021.  Currently on chemotherapy with Cabometyx; but has been off for roughly 2 months per oncology during treatment for invasive fungal infection.   Invasive fungal sinusitis Follows with infectious disease, Dr. Earlene Plater and ENT, Dr. Darl Pikes outpatient.  Recent biopsy with aspergillosis fumigatus.  Complicated by long-term steroid use.  Voriconazole was discontinued and changed to Posaconazole 300 mg p.o. daily per infectious disease.  Outpatient follow-up with ENT/ID.   Hypothyroidism TSH 1.830, within normal limits.  Recently seen by endocrinology outpatient.  Levothyroxine 150 mcg and  175 mcg PO every other day   Adrenal insufficiency Follows with endocrinology outpatient.  A.m. cortisol level low at 5.2. Hydrocortisone increased to 20 mg p.o. qAm and 10 mg PO qPM. Aldosterone/renin activity level: pending   Essential hypertension Cardizem 240 mg p.o. daily   Anemia of chronic medical disease Hemoglobin 8.6, stable.   Weakness/debility/deconditioning: PT recommended home health on discharge, patient currently declining.  Discharge Diagnoses:  Principal Problem:   Generalized weakness Active Problems:   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   Hyperlipidemia   Essential hypertension   Acute hyponatremia   Acquired hypothyroidism   Anemia of chronic disease   Hyponatremia   Palliative care encounter   Goals of care, counseling/discussion   Counseling and coordination of care   Need for emotional support   Poor appetite   Adrenal disease (HCC)   Medication management   Anxiety    Discharge Instructions  Discharge Instructions     Call MD for:  difficulty breathing, headache or visual disturbances   Complete by: As directed    Call MD for:  extreme fatigue   Complete by: As directed    Call MD for:  persistant dizziness or light-headedness   Complete by: As directed    Call MD for:  persistant nausea and vomiting   Complete by: As directed    Call MD for:  severe uncontrolled pain   Complete by: As directed    Call MD for:  temperature >100.4   Complete by: As directed    Diet - low sodium heart healthy   Complete by: As directed    Increase activity slowly   Complete by: As directed       Allergies as of 06/28/2022       Reactions   Diphenhydramine Other (See Comments)   Severe restless legs        Medication List     STOP taking these medications    ibuprofen 200 MG tablet Commonly known as: ADVIL   voriconazole 200 MG tablet Commonly known as: VFEND       TAKE these medications     acetaminophen 500 MG tablet Commonly known as: TYLENOL Take 1,000 mg by mouth 2 (two) times daily.   Cabometyx 20 MG tablet Generic drug: cabozantinib Take 1 tablet (20 mg total) by mouth daily. Take on an empty stomach, 1 hour before or 2 hours after meals.   Centrum Silver 50+Men Tabs Take 1 tablet by mouth daily with breakfast.   clonazePAM 0.5 MG disintegrating tablet Commonly known as: KLONOPIN Take 1 tablet (0.5 mg total) by mouth 2 (two) times daily as needed (anxiety).   diltiazem 240 MG 24 hr capsule Commonly known as: CARDIZEM CD Take 1 capsule (240 mg total) by mouth in the morning.   Eliquis 5 MG Tabs tablet Generic drug: apixaban Take 1 tablet (5 mg total) by mouth 2 (two) times daily.   ezetimibe 10 MG tablet Commonly known as: ZETIA Take 1 tablet (10 mg total) by mouth daily. What changed: when to take this   fluticasone 50 MCG/ACT nasal spray Commonly known as: FLONASE Place 2 sprays into both nostrils daily as needed for allergies.   hydrocortisone 10 MG tablet Commonly known as: CORTEF Take 1 tablet (10 mg total) by mouth at bedtime. What changed: You were already taking a medication with the same name, and this prescription was added. Make sure you understand how and when  to take each.   hydrocortisone 20 MG tablet Commonly known as: CORTEF Take 1 tablet (20 mg total) by mouth every morning. Start taking on: Jun 29, 2022 What changed:  medication strength how much to take how to take this when to take this additional instructions   levothyroxine 150 MCG tablet Commonly known as: SYNTHROID TAKE 1 TABLET BY MOUTH EVERY OTHER DAY   levothyroxine 175 MCG tablet Commonly known as: SYNTHROID TAKE 1 TABLET BY MOUTH EVERY OTHER DAY   loratadine 10 MG tablet Commonly known as: CLARITIN Take 10 mg by mouth daily as needed for allergies.   morphine 15 MG 12 hr tablet Commonly known as: MS CONTIN Take 1 tablet (15 mg total) by mouth every 12  (twelve) hours.   mupirocin ointment 2 % Commonly known as: BACTROBAN Place 1 Application into the nose daily as needed (sores).   OLANZapine 5 MG tablet Commonly known as: ZYPREXA Take 1 tablet (5 mg total) by mouth at bedtime.   oxyCODONE-acetaminophen 5-325 MG tablet Commonly known as: PERCOCET/ROXICET Take 1 tablet by mouth every 6 (six) hours as needed for severe pain.   posaconazole 100 MG Tbec delayed-release tablet Commonly known as: NOXAFIL Take 3 tablets (300 mg total) by mouth daily with breakfast. Start taking on: Jun 29, 2022   traZODone 150 MG tablet Commonly known as: DESYREL Take 150 mg by mouth at bedtime as needed for sleep.   VITAMIN D PO Take 1 capsule by mouth daily.        Follow-up Information     [name], MD. Schedule an appointment as soon as possible for a visit in 1 week(s).   Specialty: Family Medicine Contact information: [address] [place] [address] [phone]         Carlus Pavlov, MD. Schedule an appointment as soon as possible for a visit in 1 week(s).   Specialty: Internal Medicine Contact information: [address] [place] [address] [phone]                Allergies  Allergen Reactions   Diphenhydramine Other (See Comments)    Severe restless legs    Consultations: Palliative care Infectious disease   Procedures/Studies: DG Chest Port 1 View  Result Date: 06/24/2022 CLINICAL DATA:  Acute hyponatremia. EXAM: PORTABLE CHEST 1 VIEW COMPARISON:  CT chest 05/03/2022 FINDINGS: Stable cardiomediastinal contours. Status post right middle and right lower lobectomies. Asymmetric right lung volume loss is noted with elevation of the right hemidiaphragm. Surgical clips within the mediastinum and right infrahilar region are again seen. No superimposed pleural effusion, airspace consolidation or interstitial edema. IMPRESSION: 1. No acute cardiopulmonary  abnormalities. 2. Status post right middle and right lower lobectomies. Electronically Signed   By: Signa Kell M.D.   On: 06/24/2022 11:26     Subjective: Patient seen examined bedside, resting comfortably.  Sitting at edge of bed eating breakfast.  Daughter present.  Sodium stable, 135 today.  Ready for discharge home.  Has declined home health.  Feels his weakness and fatigue has greatly improved with increased hydrocortisone dose.  Discussed with daughter, to increase hydrocortisone to 20 mg in the morning and 10 mg at night.  Also will need close follow-up with ENT, infectious disease, endocrinology after discharge.  No other questions or concerns at this time.  Denies headache, no dizziness, no chest pain, no palpitations, no shortness of breath, no abdominal pain, no fever/chills/night sweats, no nausea/vomiting/diarrhea, no paresthesias.  No acute  events overnight per nursing staff.  Discharge Exam: Vitals:   06/27/22 2001 06/28/22 0626  BP: (!) 151/74 (!) 146/64  Pulse: 75 (!) 58  Resp: 18 18  Temp: 98.8 F (37.1 C) (!) 97.5 F (36.4 C)  SpO2: 96% 94%   Vitals:   06/27/22 1400 06/27/22 2001 06/28/22 0500 06/28/22 0626  BP: (!) 145/66 (!) 151/74  (!) 146/64  Pulse: 77 75  (!) 58  Resp: 19 18  18   Temp: 97.9 F (36.6 C) 98.8 F (37.1 C)  (!) 97.5 F (36.4 C)  TempSrc:  Oral  Oral  SpO2: 97% 96%  94%  Weight:   77.1 kg   Height:        Physical Exam: GEN: NAD, alert and oriented x 3, elderly in appearance HEENT: NCAT, PERRL, EOMI, sclera clear, MMM PULM: CTAB w/o wheezes/crackles, normal respiratory effort, on room air CV: RRR w/o M/G/R GI: abd soft, NTND, NABS, no R/G/M MSK: no peripheral edema, muscle strength globally intact 5/5 bilateral upper/lower extremities NEURO: CN II-XII intact, no focal deficits, sensation to light touch intact PSYCH: normal mood/affect Integumentary: dry/intact, no rashes or wounds    The results of significant diagnostics from this  hospitalization (including imaging, microbiology, ancillary and laboratory) are listed below for reference.     Microbiology: No results found for this or any previous visit (from the past 240 hour(s)).   Labs: BNP (last 3 results) No results for input(s): "BNP" in the last 8760 hours. Basic Metabolic Panel: Recent Labs  Lab 06/24/22 0121 06/24/22 0845 06/24/22 1805 06/25/22 0355 06/26/22 0353 06/27/22 0344 06/28/22 0337  NA 124* 126* 123* 123* 125* 135 135  K 4.0 4.2  --  3.9 4.0 3.7 4.0  CL 92* 93*  --  91* 94* 103 102  CO2 24 26  --  23 23 25 24   GLUCOSE 106* 92  --  99 116* 128* 136*  BUN 14 11  --  11 8 10 16   CREATININE 0.84 0.83  --  0.84 0.85 0.96 0.89  CALCIUM 8.1* 8.2*  --  7.8* 8.1* 8.1* 8.2*  MG 1.8  --   --   --   --   --   --    Liver Function Tests: Recent Labs  Lab 06/23/22 1206 06/24/22 0121  AST 26 23  ALT 36 34  ALKPHOS 120 99  BILITOT 0.2* 0.1*  PROT 6.7 6.4*  ALBUMIN 2.9* 2.8*   No results for input(s): "LIPASE", "AMYLASE" in the last 168 hours. No results for input(s): "AMMONIA" in the last 168 hours. CBC: Recent Labs  Lab 06/23/22 1206 06/24/22 0121  WBC 7.3 7.0  NEUTROABS 5.1 4.3  HGB 9.5* 8.6*  HCT 29.1* 27.5*  MCV 84.6 87.6  PLT 490* 374   Cardiac Enzymes: No results for input(s): "CKTOTAL", "CKMB", "CKMBINDEX", "TROPONINI" in the last 168 hours. BNP: Invalid input(s): "POCBNP" CBG: No results for input(s): "GLUCAP" in the last 168 hours. D-Dimer No results for input(s): "DDIMER" in the last 72 hours. Hgb A1c No results for input(s): "HGBA1C" in the last 72 hours. Lipid Profile No results for input(s): "CHOL", "HDL", "LDLCALC", "TRIG", "CHOLHDL", "LDLDIRECT" in the last 72 hours. Thyroid function studies No results for input(s): "TSH", "T4TOTAL", "T3FREE", "THYROIDAB" in the last 72 hours.  Invalid input(s): "FREET3" Anemia work up No results for input(s): "VITAMINB12", "FOLATE", "FERRITIN", "TIBC", "IRON", "RETICCTPCT"  in the last 72 hours. Urinalysis    Component Value Date/Time   COLORURINE STRAW (A)  06/23/2022 2005   APPEARANCEUR CLEAR 06/23/2022 2005   LABSPEC 1.011 06/23/2022 2005   PHURINE 7.0 06/23/2022 2005   GLUCOSEU NEGATIVE 06/23/2022 2005   HGBUR NEGATIVE 06/23/2022 2005   BILIRUBINUR NEGATIVE 06/23/2022 2005   KETONESUR NEGATIVE 06/23/2022 2005   PROTEINUR 30 (A) 06/23/2022 2005   UROBILINOGEN 0.2 11/12/2009 1000   NITRITE NEGATIVE 06/23/2022 2005   LEUKOCYTESUR NEGATIVE 06/23/2022 2005   Sepsis Labs Recent Labs  Lab 06/23/22 1206 06/24/22 0121  WBC 7.3 7.0   Microbiology No results found for this or any previous visit (from the past 240 hour(s)).   Time coordinating discharge: Over 30 minutes  SIGNED:   Alvira Philips [place], DO  [facility] 06/28/2022, 10:59 AM

## 2022-06-29 LAB — ASPERGILLUS ANTIGEN, BAL/SERUM: Aspergillus Ag, BAL/Serum: 0.03 Index (ref 0.00–0.49)

## 2022-06-30 ENCOUNTER — Ambulatory Visit: Payer: Medicare HMO | Admitting: Internal Medicine

## 2022-06-30 ENCOUNTER — Encounter: Payer: Self-pay | Admitting: Internal Medicine

## 2022-06-30 ENCOUNTER — Other Ambulatory Visit: Payer: Self-pay

## 2022-06-30 ENCOUNTER — Other Ambulatory Visit (HOSPITAL_COMMUNITY): Payer: Self-pay

## 2022-06-30 VITALS — BP 163/81 | HR 81 | Temp 98.1°F | Ht 68.0 in | Wt 176.0 lb

## 2022-06-30 DIAGNOSIS — J329 Chronic sinusitis, unspecified: Secondary | ICD-10-CM | POA: Diagnosis not present

## 2022-06-30 DIAGNOSIS — B4489 Other forms of aspergillosis: Secondary | ICD-10-CM | POA: Diagnosis not present

## 2022-06-30 DIAGNOSIS — B49 Unspecified mycosis: Secondary | ICD-10-CM | POA: Diagnosis not present

## 2022-06-30 NOTE — Patient Instructions (Signed)
Thank you for coming to see me today. It was a pleasure seeing you.  To Do: Pick up posaconazole from the pharmacy and start taking tomorrow morning Come in next Wednesday for a blood test.  Do not take the posaconazole that morning before the blood test See you in a few weeks.  If you have any questions or concerns, please do not hesitate to call the office at [phone].  Take Care,   Gwynn Burly

## 2022-06-30 NOTE — Assessment & Plan Note (Signed)
Patient with fungal sinusitis due to Aspergillus fumigatus with osseous involvement involving the left maxillary sinus with extension to the left masticator space.  He is here today for follow-up after not tolerating voriconazole with sleep disturbances.  He was also hospitalized for hyponatremia which would be a rare possible side effect of voriconazole.  He was placed on posaconazole 300 mg daily starting 06/24/2022, however, has now not had access to the medication since hospital discharge.  He'll pick up the medication today and resume tomorrow 07/01/22.  Will obtain a posaconazole level next Wednesday to ensure he is at a therapeutic dose.  Plan is for repeat CT scan scheduled for 07/20/2022 to assess his response to treatment.  The consensus amongst radiation oncology, ENT, and ID is for targeted antifungal therapy with repeat imaging at that time.  Likely plan for prolonged antifungal therapy and follow up on 07/22/22.

## 2022-06-30 NOTE — Progress Notes (Signed)
[facility]  CHIEF COMPLAINT:    Follow up for fungal sinusitis  SUBJECTIVE:    [name] is a 77 y.o. male with PMHx as below who presents to the clinic for fungal sinusitis.   Patient presents today for follow-up after initial visit on 06/10/2022.  At that time, he had not initiated his voriconazole as prescribed by his ENT provider.  He subsequently began taking the medication, however, did not tolerated very well due to symptoms of insomnia and night sweats.  His voriconazole trough level 1 week after starting therapy was slightly on the high side.  His dose was adjusted, however, he subsequently required admission to [facility] due to hyponatremia.  It was postulated that this could be a rare side effect from voriconazole although probably less likely.  Nonetheless, he was transitioned to posaconazole 300 mg daily during the hospitalization and was discharged with a 90-day supply.  Posaconazole was started on 06/24/2022.  He was discharged from the hospital 2 days ago on 06/28/2022.  Prior to his hospitalization, he had follow-up with ENT again on 06/15/2022 at which time he reported epistaxis as well as the noted sleep disturbances.  During that visit he had endoscopy with debridement performed.  He has a repeat CT scan scheduled for July 20, 2022.  Today, he is doing okay since hospital discharge.  He has not had posaconazole since the morning of 5/12 due to the pharmacy not having it in stock.  They will pick it up from the pharmacy today.  He is feeling fatigued still and having some sinus discomfort.  He reports still having some epistaxis.     Please see A&P for the details of today's visit and status of the patient's medical problems.   Patient's Medications  New Prescriptions   No medications on file  Previous Medications   ACETAMINOPHEN (TYLENOL) 500 MG TABLET    Take 1,000 mg by mouth 2 (two) times daily.   CABOZANTINIB (CABOMETYX) 20 MG TABLET     Take 1 tablet (20 mg total) by mouth daily. Take on an empty stomach, 1 hour before or 2 hours after meals.   CLONAZEPAM (KLONOPIN) 0.5 MG DISINTEGRATING TABLET    Take 1 tablet (0.5 mg total) by mouth 2 (two) times daily as needed (anxiety).   DILTIAZEM (CARDIZEM CD) 240 MG 24 HR CAPSULE    Take 1 capsule (240 mg total) by mouth in the morning.   ELIQUIS 5 MG TABS TABLET    Take 1 tablet (5 mg total) by mouth 2 (two) times daily.   EZETIMIBE (ZETIA) 10 MG TABLET    Take 1 tablet (10 mg total) by mouth daily.   FLUTICASONE (FLONASE) 50 MCG/ACT NASAL SPRAY    Place 2 sprays into both nostrils daily as needed for allergies.   HYDROCORTISONE (CORTEF) 10 MG TABLET    Take 1 tablet (10 mg total) by mouth at bedtime.   HYDROCORTISONE (CORTEF) 20 MG TABLET    Take 1 tablet (20 mg total) by mouth every morning.   LEVOTHYROXINE (SYNTHROID) 150 MCG TABLET    TAKE 1 TABLET BY MOUTH EVERY OTHER DAY   LEVOTHYROXINE (SYNTHROID) 175 MCG TABLET    TAKE 1 TABLET BY MOUTH EVERY OTHER DAY   LORATADINE (CLARITIN) 10 MG TABLET    Take 10 mg by mouth daily as needed for allergies.   MORPHINE (MS CONTIN) 15 MG 12 HR TABLET    Take 1  tablet (15 mg total) by mouth every 12 (twelve) hours.   MULTIPLE VITAMINS-MINERALS (CENTRUM SILVER 50+MEN) TABS    Take 1 tablet by mouth daily with breakfast.   MUPIROCIN OINTMENT (BACTROBAN) 2 %    Place 1 Application into the nose daily as needed (sores).   OLANZAPINE (ZYPREXA) 5 MG TABLET    Take 1 tablet (5 mg total) by mouth at bedtime.   OXYCODONE-ACETAMINOPHEN (PERCOCET/ROXICET) 5-325 MG TABLET    Take 1 tablet by mouth every 6 (six) hours as needed for severe pain.   POSACONAZOLE (NOXAFIL) 100 MG TBEC DELAYED-RELEASE TABLET    Take 3 tablets (300 mg total) by mouth daily with breakfast.   TRAZODONE (DESYREL) 150 MG TABLET    Take 150 mg by mouth at bedtime as needed for sleep.   VITAMIN D PO    Take 1 capsule by mouth daily.  Modified Medications   No medications on file   Discontinued Medications   No medications on file      Past Medical History:  Diagnosis Date   Arthritis    Chronic kidney disease    only has one kidney    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 02/01/2015   Coronary artery disease    followed by Dr.Tilley   Frequent PVCs    GERD (gastroesophageal reflux disease)    Heart murmur    never has caused any problems   Hx of cancer of lung 1980's   Hyperlipidemia    Hypertension    Hypothyroidism    Incisional hernia 08/01/2015   Lung cancer (HCC) 1993   Lung metastases 2019   Pneumonia    x several   Recurrent umbilical hernia 08/01/2015    Social History   Tobacco Use   Smoking status: Former    Packs/day: 1.00    Years: 20.00    Additional pack years: 0.00    Total pack years: 20.00    Types: Cigarettes    Quit date: 01/29/1988    Years since quitting: 34.4   Smokeless tobacco: Never  Vaping Use   Vaping Use: Never used  Substance Use Topics   Alcohol use: Not Currently    Comment: rare   Drug use: No    Family History  Problem Relation Age of Onset   Heart attack Father     Allergies  Allergen Reactions   Diphenhydramine Other (See Comments)    Severe restless legs    Review of Systems  All other systems reviewed and are negative.  Except as noted above.   OBJECTIVE:    Vitals:   06/30/22 1451  BP: (!) 163/81  Pulse: 81  Temp: 98.1 F (36.7 C)  TempSrc: Temporal  SpO2: 94%  Weight: 176 lb (79.8 kg)  Height: 5\' 8"  (1.727 m)   Body mass index is 26.76 kg/m.  Physical Exam Constitutional:      Appearance: Normal appearance.  Eyes:     Extraocular Movements: Extraocular movements intact.     Conjunctiva/sclera: Conjunctivae normal.  Abdominal:     General: There is no distension.     Palpations: Abdomen is soft.  Musculoskeletal:     Cervical back: Normal range of motion and neck supple.  Skin:    General: Skin is warm and dry.  Neurological:      General: No focal deficit present.     Mental Status: He is alert and oriented to person, place, and time.  Psychiatric:  Mood and Affect: Mood normal.        Behavior: Behavior normal.      Labs and Microbiology:    Latest Ref Rng & Units 06/24/2022    1:21 AM 06/23/2022   12:06 PM 06/11/2022    7:55 AM  CBC  WBC 4.0 - 10.5 K/uL 7.0  7.3  9.9   Hemoglobin 13.0 - 17.0 g/dL 8.6  9.5  9.7   Hematocrit 39.0 - 52.0 % 27.5  29.1  31.0   Platelets 150 - 400 K/uL 374  490  338       Latest Ref Rng & Units 06/28/2022    3:37 AM 06/27/2022    3:44 AM 06/26/2022    3:53 AM  CMP  Glucose 70 - 99 mg/dL 161  096  045   BUN 8 - 23 mg/dL 16  10  8    Creatinine 0.61 - 1.24 mg/dL 4.09  8.11  9.14   Sodium 135 - 145 mmol/L 135  135  125   Potassium 3.5 - 5.1 mmol/L 4.0  3.7  4.0   Chloride 98 - 111 mmol/L 102  103  94   CO2 22 - 32 mmol/L 24  25  23    Calcium 8.9 - 10.3 mg/dL 8.2  8.1  8.1      Recent Results (from the past 240 hour(s))  Aspergillus Ag, BAL/Serum     Status: None   Collection Time: 06/26/22  1:41 PM   Specimen: Vein  Result Value Ref Range Status   Aspergillus Ag, BAL/Serum 0.03 0.00 - 0.49 Index Final    Comment: (NOTE) Performed At: [facility] [employer] [address] [place], [place] [id number] Jolene Schimke MD YQ:[id number]     ASSESSMENT & PLAN:    Invasive fungal sinusitis Patient with fungal sinusitis due to Aspergillus fumigatus with osseous involvement involving the left maxillary sinus with extension to the left masticator space.  He is here today for follow-up after not tolerating voriconazole with sleep disturbances.  He was also hospitalized for hyponatremia which would be a rare possible side effect of voriconazole.  He was placed on posaconazole 300 mg daily starting 06/24/2022, however, has now not had access to the medication since hospital discharge.  He'll pick up the medication today and resume tomorrow 07/01/22.  Will obtain a posaconazole level  next Wednesday to ensure he is at a therapeutic dose.  Plan is for repeat CT scan scheduled for 07/20/2022 to assess his response to treatment.  The consensus amongst radiation oncology, ENT, and ID is for targeted antifungal therapy with repeat imaging at that time.  Likely plan for prolonged antifungal therapy and follow up on 07/22/22.   Orders Placed This Encounter  Procedures   Posaconazole    Standing Status:   Future    Standing Expiration Date:   06/30/2023        [facility] 06/30/2022, 3:24 PM   I have personally spent 30 minutes involved in face-to-face and non-face-to-face activities for this patient on the day of the visit. Professional time spent includes the following activities: Preparing to see the patient (review of tests), Obtaining and/or reviewing separately obtained history (admission/discharge record), Performing a medically appropriate examination and/or evaluation , Ordering medications/tests/procedures, referring and communicating with other health care professionals, Documenting clinical information in the EMR, Independently interpreting results (not separately reported), Communicating results to the patient/family/caregiver, Counseling and educating the patient/family/caregiver and Care coordination (not  separately reported).

## 2022-07-01 ENCOUNTER — Other Ambulatory Visit (HOSPITAL_COMMUNITY): Payer: Self-pay

## 2022-07-01 ENCOUNTER — Other Ambulatory Visit: Payer: Medicare HMO

## 2022-07-01 NOTE — Progress Notes (Deleted)
Palliative Medicine [facility]  Telephone:([phone]) [phone] [phone]) [phone]   Name: [name] Date: 07/01/2022 MRN: [id number]  DOB: [birth year]  Patient Care Team: [name], MD as PCP - General (Family Medicine) Thurmon Fair, MD as PCP - Cardiology (Cardiology) Karie Soda, MD as Consulting Physician (General Surgery) Crist Fat, MD as Consulting Physician (Urology)    I connected with [name] on 07/01/22 at  1:00 PM EDT by phone and verified that I am speaking with the correct person using two identifiers.   I discussed the limitations, risks, security and privacy concerns of performing an evaluation and management service by telemedicine and the availability of in-person appointments. I also discussed with the patient that there may be a patient responsible charge related to this service. The patient expressed understanding and agreed to proceed.   Other persons participating in the visit and their role in the encounter: daughter [name]   Patient's location: home  Provider's location: [facility]   Chief Complaint: f/u of symptom management   INTERVAL HISTORY: [name] is a 77 y.o. 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 Palliative ask to see for symptom and pain management and goals of care.   SOCIAL HISTORY:    [name] reports that he quit smoking about 34 years ago. His smoking use included cigarettes. He has a 20.00 pack-year smoking history. He has never used smokeless tobacco. He reports that he does not currently use alcohol. He reports that he does not use drugs.  ADVANCE DIRECTIVES:  None on file CODE STATUS: Full code  PAST MEDICAL HISTORY: Past Medical History:  Diagnosis Date   Arthritis    Chronic kidney disease    only has one kidney    Clear cell renal cell carcinoma  s/p robotic nephrectomy Dec 2016 02/01/2015   Coronary artery disease    followed by Dr.Tilley   Frequent PVCs    GERD (gastroesophageal reflux disease)    Heart murmur    never has caused any problems   Hx of cancer of lung 1980's   Hyperlipidemia    Hypertension    Hypothyroidism    Incisional hernia 08/01/2015   Lung cancer (HCC) 1993   Lung metastases 2019   Pneumonia    x several   Recurrent umbilical hernia 08/01/2015    ALLERGIES:  is allergic to diphenhydramine.  MEDICATIONS:  Current Outpatient Medications  Medication Sig Dispense Refill   acetaminophen (TYLENOL) 500 MG tablet Take 1,000 mg by mouth 2 (two) times daily.     cabozantinib (CABOMETYX) 20 MG tablet Take 1 tablet (20 mg total) by mouth daily. Take on an empty stomach, 1 hour before or 2 hours after meals. 30 tablet 1   clonazepam (KLONOPIN) 0.5 MG disintegrating tablet Take 1 tablet (0.5 mg total) by mouth 2 (two) times daily as needed (anxiety). 60 tablet 2   diltiazem (CARDIZEM CD) 240 MG 24 hr capsule Take 1 capsule (240 mg total) by mouth in the morning. 90 capsule 3   ELIQUIS 5 MG TABS tablet Take 1 tablet (5 mg total) by mouth 2 (two) times daily. 60 tablet 2   ezetimibe (ZETIA) 10 MG tablet Take 1 tablet (10 mg total) by mouth daily. (Patient taking differently: Take 10 mg by mouth 2 (two) times a week.) 90 tablet 3   fluticasone (FLONASE) 50 MCG/ACT nasal spray Place  2 sprays into both nostrils daily as needed for allergies.     hydrocortisone (CORTEF) 10 MG tablet Take 1 tablet (10 mg total) by mouth at bedtime. 90 tablet 0   hydrocortisone (CORTEF) 20 MG tablet Take 1 tablet (20 mg total) by mouth every morning. 90 tablet 0   levothyroxine (SYNTHROID) 150 MCG tablet TAKE 1 TABLET BY MOUTH EVERY OTHER DAY (Patient taking differently: Take 150 mcg by mouth every other day.) 30 tablet 3   levothyroxine (SYNTHROID) 175 MCG tablet TAKE 1 TABLET BY MOUTH EVERY OTHER DAY 30 tablet 3   loratadine (CLARITIN)  10 MG tablet Take 10 mg by mouth daily as needed for allergies.     morphine (MS CONTIN) 15 MG 12 hr tablet Take 1 tablet (15 mg total) by mouth every 12 (twelve) hours. 30 tablet 0   Multiple Vitamins-Minerals (CENTRUM SILVER 50+MEN) TABS Take 1 tablet by mouth daily with breakfast.     mupirocin ointment (BACTROBAN) 2 % Place 1 Application into the nose daily as needed (sores).     OLANZapine (ZYPREXA) 5 MG tablet Take 1 tablet (5 mg total) by mouth at bedtime. 90 tablet 0   oxyCODONE-acetaminophen (PERCOCET/ROXICET) 5-325 MG tablet Take 1 tablet by mouth every 6 (six) hours as needed for severe pain. 60 tablet 0   posaconazole (NOXAFIL) 100 MG TBEC delayed-release tablet Take 3 tablets (300 mg total) by mouth daily with breakfast. 90 tablet 2   traZODone (DESYREL) 150 MG tablet Take 150 mg by mouth at bedtime as needed for sleep.     VITAMIN D PO Take 1 capsule by mouth daily.     No current facility-administered medications for this visit.    VITAL SIGNS: There were no vitals taken for this visit. There were no vitals filed for this visit.  Estimated body mass index is 26.76 kg/m as calculated from the following:   Height as of 06/30/22: 5\' 8"  (1.727 m).   Weight as of 06/30/22: 176 lb (79.8 kg).     Latest Ref Rng & Units 06/24/2022    1:21 AM 06/23/2022   12:06 PM 06/11/2022    7:55 AM  CBC  WBC 4.0 - 10.5 K/uL 7.0  7.3  9.9   Hemoglobin 13.0 - 17.0 g/dL 8.6  9.5  9.7   Hematocrit 39.0 - 52.0 % 27.5  29.1  31.0   Platelets 150 - 400 K/uL 374  490  338        Latest Ref Rng & Units 06/28/2022    3:37 AM 06/27/2022    3:44 AM 06/26/2022    3:53 AM  CMP  Glucose 70 - 99 mg/dL 161  096  045   BUN 8 - 23 mg/dL 16  10  8    Creatinine 0.61 - 1.24 mg/dL 4.09  8.11  9.14   Sodium 135 - 145 mmol/L 135  135  125   Potassium 3.5 - 5.1 mmol/L 4.0  3.7  4.0   Chloride 98 - 111 mmol/L 102  103  94   CO2 22 - 32 mmol/L 24  25  23    Calcium 8.9 - 10.3 mg/dL 8.2  8.1  8.1      PERFORMANCE  STATUS (ECOG) : 1 - Symptomatic but completely ambulatory   Physical Exam General: NAD, weak appearing Cardiovascular: slightly irregular Pulmonary: normal breathing pattern Abdomen: soft, nontender, + bowel sounds Extremities: no edema, no joint deformities Skin: no rashes, pale Neurological: lethargic, slowed speech, oriented x 4  IMPRESSION:  Fatigue:  2.  Dehydration:    PLAN: Will not resume long-acting MS Contin at this time, as pain is currently controlled. Continue with Oxycodone 5/325 mg every 6 hours as needed for moderate to severe pain. IV fluids today while in clinic over 2 hours. Ongoing symptom management and goals of care support. Palliative will plan to see patient back in 3-4 weeks in collaboration to other oncology appointments.  Patient knows to contact our office if needed sooner.   Patient expressed understanding and was in agreement with this plan. He also understands that He can call the clinic at any time with any questions, concerns, or complaints.   Any controlled substances utilized were prescribed in the context of palliative care. PDMP has been reviewed.   Visit consisted of counseling and education dealing with the complex and emotionally intense issues of symptom management and palliative care in the setting of serious and potentially life-threatening illness.Greater than 50%  of this time was spent counseling and coordinating care related to the above assessment and plan.   Willette Alma, AGPCNP-BC  Palliative Medicine Team/[facility]  *Please note that this is a verbal dictation therefore any spelling or grammatical errors are due to the "[employer]" system interpretation.

## 2022-07-02 ENCOUNTER — Other Ambulatory Visit: Payer: Self-pay

## 2022-07-02 ENCOUNTER — Inpatient Hospital Stay: Payer: Medicare HMO | Admitting: Nurse Practitioner

## 2022-07-02 LAB — ALDOSTERONE + RENIN ACTIVITY W/ RATIO
ALDO / PRA Ratio: <3.5 (ref 0.0–30.0)
Aldosterone: <1 ng/dL (ref 0.0–30.0)
PRA LC/MS/MS: 0.289 ng/mL/hr (ref 0.167–5.380)

## 2022-07-02 MED ORDER — DIAZEPAM 2 MG PO TABS: 2.0000 mg | ORAL_TABLET | Freq: Three times a day (TID) | ORAL | 0 refills | Status: DC | PRN

## 2022-07-02 NOTE — Telephone Encounter (Signed)
Pt daughter called reporting that since taking Klonopin pt is becoming restless and it is not helping pt anxiety. Daughter explains that this happened in the hospital as well and pt had to be given a "sleeping medication" to help him sleep at night. Per Lowella Bandy, NP, pt to stop taking Klonopin and start with low-dose Valium. Pt daughter educated on how to take medication as well as adverse effects. Pt daughter verbalized understanding, no further questions or concerns at this time.

## 2022-07-03 NOTE — Progress Notes (Unsigned)
Palliative Medicine [facility]  [phone]) [phone] [phone]) [phone]   Name: [name] Date: 07/03/2022 MRN: [id number]  DOB: 01/18/1946  Patient Care Team: [name], MD as PCP - General (Family Medicine) Thurmon Fair, MD as PCP - Cardiology (Cardiology) Karie Soda, MD as Consulting Physician (General Surgery) Crist Fat, MD as Consulting Physician (Urology)    I connected with [name] on 07/03/22 at 11:00 AM EDT by phone and verified that I am speaking with the correct person using two identifiers.   I discussed the limitations, risks, security and privacy concerns of performing an evaluation and management service by telemedicine and the availability of in-person appointments. I also discussed with the patient that there may be a patient responsible charge related to this service. The patient expressed understanding and agreed to proceed.   Other persons participating in the visit and their role in the encounter: daughter [name]   Patient's location: home  Provider's location: [facility]   Chief Complaint: f/u of symptom management   INTERVAL HISTORY: [name] is a 77 y.o. 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 Palliative ask to see for symptom and pain management and goals of care.   SOCIAL HISTORY:    [name] reports that he quit smoking about 34 years ago. His smoking use included cigarettes. He has a 20.00 pack-year smoking history. He has never used smokeless tobacco. He reports that he does not currently use alcohol. He reports that he does not use drugs.  ADVANCE DIRECTIVES:  None on file CODE STATUS: Full code  PAST MEDICAL HISTORY: Past Medical History:  Diagnosis Date   Arthritis    Chronic kidney disease    only has one kidney    Clear cell renal cell carcinoma  s/p robotic nephrectomy Dec 2016 02/01/2015   Coronary artery disease    followed by Dr.Tilley   Frequent PVCs    GERD (gastroesophageal reflux disease)    Heart murmur    never has caused any problems   Hx of cancer of lung 1980's   Hyperlipidemia    Hypertension    Hypothyroidism    Incisional hernia 08/01/2015   Lung cancer (HCC) 1993   Lung metastases 2019   Pneumonia    x several   Recurrent umbilical hernia 08/01/2015    ALLERGIES:  is allergic to diphenhydramine.  MEDICATIONS:  Current Outpatient Medications  Medication Sig Dispense Refill   acetaminophen (TYLENOL) 500 MG tablet Take 1,000 mg by mouth 2 (two) times daily.     cabozantinib (CABOMETYX) 20 MG tablet Take 1 tablet (20 mg total) by mouth daily. Take on an empty stomach, 1 hour before or 2 hours after meals. 30 tablet 1   diazepam (VALIUM) 2 MG tablet Take 1 tablet (2 mg total) by mouth every 8 (eight) hours as needed for anxiety. 45 tablet 0   diltiazem (CARDIZEM CD) 240 MG 24 hr capsule Take 1 capsule (240 mg total) by mouth in the morning. 90 capsule 3   ELIQUIS 5 MG TABS tablet Take 1 tablet (5 mg total) by mouth 2 (two) times daily. 60 tablet 2   ezetimibe (ZETIA) 10 MG tablet Take 1 tablet (10 mg total) by mouth daily. (Patient taking differently: Take 10 mg by mouth 2 (two) times a week.) 90 tablet 3   fluticasone (FLONASE) 50 MCG/ACT nasal spray Place 2  sprays into both nostrils daily as needed for allergies.     hydrocortisone (CORTEF) 10 MG tablet Take 1 tablet (10 mg total) by mouth at bedtime. 90 tablet 0   hydrocortisone (CORTEF) 20 MG tablet Take 1 tablet (20 mg total) by mouth every morning. 90 tablet 0   levothyroxine (SYNTHROID) 150 MCG tablet TAKE 1 TABLET BY MOUTH EVERY OTHER DAY (Patient taking differently: Take 150 mcg by mouth every other day.) 30 tablet 3   levothyroxine (SYNTHROID) 175 MCG tablet TAKE 1 TABLET BY MOUTH EVERY OTHER DAY 30 tablet 3   loratadine (CLARITIN) 10 MG tablet Take 10  mg by mouth daily as needed for allergies.     morphine (MS CONTIN) 15 MG 12 hr tablet Take 1 tablet (15 mg total) by mouth every 12 (twelve) hours. 30 tablet 0   Multiple Vitamins-Minerals (CENTRUM SILVER 50+MEN) TABS Take 1 tablet by mouth daily with breakfast.     mupirocin ointment (BACTROBAN) 2 % Place 1 Application into the nose daily as needed (sores).     OLANZapine (ZYPREXA) 5 MG tablet Take 1 tablet (5 mg total) by mouth at bedtime. 90 tablet 0   oxyCODONE-acetaminophen (PERCOCET/ROXICET) 5-325 MG tablet Take 1 tablet by mouth every 6 (six) hours as needed for severe pain. 60 tablet 0   posaconazole (NOXAFIL) 100 MG TBEC delayed-release tablet Take 3 tablets (300 mg total) by mouth daily with breakfast. 90 tablet 2   traZODone (DESYREL) 150 MG tablet Take 150 mg by mouth at bedtime as needed for sleep.     VITAMIN D PO Take 1 capsule by mouth daily.     No current facility-administered medications for this visit.    VITAL SIGNS: There were no vitals taken for this visit. There were no vitals filed for this visit.  Estimated body mass index is 26.76 kg/m as calculated from the following:   Height as of 06/30/22: 5\' 8"  (1.727 m).   Weight as of 06/30/22: 176 lb (79.8 kg).     Latest Ref Rng & Units 06/24/2022    1:21 AM 06/23/2022   12:06 PM 06/11/2022    7:55 AM  CBC  WBC 4.0 - 10.5 K/uL 7.0  7.3  9.9   Hemoglobin 13.0 - 17.0 g/dL 8.6  9.5  9.7   Hematocrit 39.0 - 52.0 % 27.5  29.1  31.0   Platelets 150 - 400 K/uL 374  490  338        Latest Ref Rng & Units 06/28/2022    3:37 AM 06/27/2022    3:44 AM 06/26/2022    3:53 AM  CMP  Glucose 70 - 99 mg/dL 161  096  045   BUN 8 - 23 mg/dL 16  10  8    Creatinine 0.61 - 1.24 mg/dL 4.09  8.11  9.14   Sodium 135 - 145 mmol/L 135  135  125   Potassium 3.5 - 5.1 mmol/L 4.0  3.7  4.0   Chloride 98 - 111 mmol/L 102  103  94   CO2 22 - 32 mmol/L 24  25  23    Calcium 8.9 - 10.3 mg/dL 8.2  8.1  8.1      PERFORMANCE STATUS (ECOG) : 1 -  Symptomatic but completely ambulatory   Physical Exam General: NAD, weak appearing Cardiovascular: slightly irregular Pulmonary: normal breathing pattern Abdomen: soft, nontender, + bowel sounds Extremities: no edema, no joint deformities Skin: no rashes, pale Neurological: lethargic, slowed speech, oriented x 4  IMPRESSION:  Fatigue:  2.  Dehydration:    PLAN: Will not resume long-acting MS Contin at this time, as pain is currently controlled. Continue with Oxycodone 5/325 mg every 6 hours as needed for moderate to severe pain. IV fluids today while in clinic over 2 hours. Ongoing symptom management and goals of care support. Palliative will plan to see patient back in 3-4 weeks in collaboration to other oncology appointments.  Patient knows to contact our office if needed sooner.   Patient expressed understanding and was in agreement with this plan. He also understands that He can call the clinic at any time with any questions, concerns, or complaints.   Any controlled substances utilized were prescribed in the context of palliative care. PDMP has been reviewed.   Visit consisted of counseling and education dealing with the complex and emotionally intense issues of symptom management and palliative care in the setting of serious and potentially life-threatening illness.Greater than 50%  of this time was spent counseling and coordinating care related to the above assessment and plan.   Willette Alma, AGPCNP-BC  Palliative Medicine Team/[facility]  *Please note that this is a verbal dictation therefore any spelling or grammatical errors are due to the "[employer]" system interpretation.

## 2022-07-07 ENCOUNTER — Inpatient Hospital Stay (HOSPITAL_BASED_OUTPATIENT_CLINIC_OR_DEPARTMENT_OTHER): Payer: Medicare HMO | Admitting: Nurse Practitioner

## 2022-07-07 ENCOUNTER — Encounter: Payer: Self-pay | Admitting: Nurse Practitioner

## 2022-07-07 ENCOUNTER — Other Ambulatory Visit (HOSPITAL_COMMUNITY): Payer: Self-pay

## 2022-07-07 DIAGNOSIS — Z515 Encounter for palliative care: Secondary | ICD-10-CM | POA: Diagnosis not present

## 2022-07-07 DIAGNOSIS — C641 Malignant neoplasm of right kidney, except renal pelvis: Secondary | ICD-10-CM

## 2022-07-07 DIAGNOSIS — R530 Neoplastic (malignant) related fatigue: Secondary | ICD-10-CM

## 2022-07-08 ENCOUNTER — Other Ambulatory Visit: Payer: Medicare HMO

## 2022-07-08 ENCOUNTER — Other Ambulatory Visit: Payer: Self-pay

## 2022-07-08 DIAGNOSIS — B4489 Other forms of aspergillosis: Secondary | ICD-10-CM | POA: Diagnosis not present

## 2022-07-08 DIAGNOSIS — B49 Unspecified mycosis: Secondary | ICD-10-CM

## 2022-07-08 DIAGNOSIS — J329 Chronic sinusitis, unspecified: Secondary | ICD-10-CM | POA: Diagnosis not present

## 2022-07-11 LAB — POSACONAZOLE: Posaconazole: 1.41 ug/mL

## 2022-07-13 ENCOUNTER — Other Ambulatory Visit: Payer: Self-pay

## 2022-07-13 ENCOUNTER — Emergency Department (HOSPITAL_COMMUNITY): Payer: Medicare HMO

## 2022-07-13 ENCOUNTER — Encounter (HOSPITAL_COMMUNITY): Payer: Self-pay | Admitting: *Deleted

## 2022-07-13 ENCOUNTER — Emergency Department (HOSPITAL_COMMUNITY)
Admission: EM | Admit: 2022-07-13 | Discharge: 2022-07-13 | Disposition: A | Discharge status: Home / Self Care | Payer: Medicare HMO | Attending: Emergency Medicine | Admitting: Emergency Medicine

## 2022-07-13 DIAGNOSIS — K769 Liver disease, unspecified: Secondary | ICD-10-CM | POA: Diagnosis not present

## 2022-07-13 DIAGNOSIS — J3489 Other specified disorders of nose and nasal sinuses: Secondary | ICD-10-CM | POA: Diagnosis not present

## 2022-07-13 DIAGNOSIS — J984 Other disorders of lung: Secondary | ICD-10-CM | POA: Diagnosis not present

## 2022-07-13 DIAGNOSIS — C787 Secondary malignant neoplasm of liver and intrahepatic bile duct: Secondary | ICD-10-CM | POA: Diagnosis not present

## 2022-07-13 DIAGNOSIS — E860 Dehydration: Secondary | ICD-10-CM | POA: Insufficient documentation

## 2022-07-13 DIAGNOSIS — C641 Malignant neoplasm of right kidney, except renal pelvis: Secondary | ICD-10-CM | POA: Diagnosis not present

## 2022-07-13 DIAGNOSIS — R4182 Altered mental status, unspecified: Secondary | ICD-10-CM | POA: Diagnosis not present

## 2022-07-13 DIAGNOSIS — R109 Unspecified abdominal pain: Secondary | ICD-10-CM | POA: Diagnosis not present

## 2022-07-13 DIAGNOSIS — Z85528 Personal history of other malignant neoplasm of kidney: Secondary | ICD-10-CM | POA: Insufficient documentation

## 2022-07-13 DIAGNOSIS — D72829 Elevated white blood cell count, unspecified: Secondary | ICD-10-CM | POA: Diagnosis not present

## 2022-07-13 DIAGNOSIS — K8689 Other specified diseases of pancreas: Secondary | ICD-10-CM | POA: Diagnosis not present

## 2022-07-13 LAB — COMPREHENSIVE METABOLIC PANEL
ALT: 28 U/L (ref 0–44)
AST: 20 U/L (ref 15–41)
Albumin: 3 g/dL — ABNORMAL LOW (ref 3.5–5.0)
Alkaline Phosphatase: 74 U/L (ref 38–126)
Anion gap: 10 (ref 5–15)
BUN: 15 mg/dL (ref 8–23)
CO2: 28 mmol/L (ref 22–32)
Calcium: 8.3 mg/dL — ABNORMAL LOW (ref 8.9–10.3)
Chloride: 95 mmol/L — ABNORMAL LOW (ref 98–111)
Creatinine, Ser: 0.95 mg/dL (ref 0.61–1.24)
GFR, Estimated: >60 mL/min (ref >60)
Glucose, Bld: 145 mg/dL — ABNORMAL HIGH (ref 70–99)
Potassium: 3.9 mmol/L (ref 3.5–5.1)
Sodium: 133 mmol/L — ABNORMAL LOW (ref 135–145)
Total Bilirubin: 0.6 mg/dL (ref 0.3–1.2)
Total Protein: 7.4 g/dL (ref 6.5–8.1)

## 2022-07-13 LAB — URINALYSIS, ROUTINE W REFLEX MICROSCOPIC
Bacteria, UA: NONE SEEN
Bilirubin Urine: NEGATIVE
Glucose, UA: NEGATIVE mg/dL
Hgb urine dipstick: NEGATIVE
Ketones, ur: NEGATIVE mg/dL
Leukocytes,Ua: NEGATIVE
Nitrite: NEGATIVE
Protein, ur: >=300 mg/dL — AB
Specific Gravity, Urine: 1.018 (ref 1.005–1.030)
pH: 5 (ref 5.0–8.0)

## 2022-07-13 LAB — CBC WITH DIFFERENTIAL/PLATELET
Abs Immature Granulocytes: 0.06 10*3/uL (ref 0.00–0.07)
Basophils Absolute: 0 10*3/uL (ref 0.0–0.1)
Basophils Relative: 0 %
Eosinophils Absolute: 0.1 10*3/uL (ref 0.0–0.5)
Eosinophils Relative: 0 %
HCT: 31.2 % — ABNORMAL LOW (ref 39.0–52.0)
Hemoglobin: 9.5 g/dL — ABNORMAL LOW (ref 13.0–17.0)
Immature Granulocytes: 0 %
Lymphocytes Relative: 12 %
Lymphs Abs: 1.6 10*3/uL (ref 0.7–4.0)
MCH: 26 pg (ref 26.0–34.0)
MCHC: 30.4 g/dL (ref 30.0–36.0)
MCV: 85.2 fL (ref 80.0–100.0)
Monocytes Absolute: 0.9 10*3/uL (ref 0.1–1.0)
Monocytes Relative: 7 %
Neutro Abs: 10.8 10*3/uL — ABNORMAL HIGH (ref 1.7–7.7)
Neutrophils Relative %: 81 %
Platelets: 461 10*3/uL — ABNORMAL HIGH (ref 150–400)
RBC: 3.66 MIL/uL — ABNORMAL LOW (ref 4.22–5.81)
RDW: 16 % — ABNORMAL HIGH (ref 11.5–15.5)
WBC: 13.5 10*3/uL — ABNORMAL HIGH (ref 4.0–10.5)
nRBC: 0 % (ref 0.0–0.2)

## 2022-07-13 LAB — AMMONIA: Ammonia: 15 umol/L (ref 9–35)

## 2022-07-13 LAB — LACTIC ACID, PLASMA: Lactic Acid, Venous: 0.7 mmol/L (ref 0.5–1.9)

## 2022-07-13 LAB — TROPONIN I (HIGH SENSITIVITY)
Troponin I (High Sensitivity): 45 ng/L — ABNORMAL HIGH (ref <18)
Troponin I (High Sensitivity): 36 ng/L — ABNORMAL HIGH (ref <18)

## 2022-07-13 LAB — TSH: TSH: 0.182 u[IU]/mL — ABNORMAL LOW (ref 0.350–4.500)

## 2022-07-13 LAB — CK: Total CK: 30 U/L — ABNORMAL LOW (ref 49–397)

## 2022-07-13 MED ORDER — IOHEXOL 350 MG/ML SOLN
100.0000 mL | Freq: Once | INTRAVENOUS | Status: AC | PRN
  Administered 2022-07-13: 100 mL via INTRAVENOUS

## 2022-07-13 MED ORDER — LACTATED RINGERS IV BOLUS
1000.0000 mL | Freq: Once | INTRAVENOUS | Status: AC
  Administered 2022-07-13: 1000 mL via INTRAVENOUS

## 2022-07-13 NOTE — ED Notes (Signed)
During ortho VS, pt felt okay during the lying and sitting portion. For the standing portion, pt stated they felt weak and tired.

## 2022-07-13 NOTE — Discharge Instructions (Signed)
You are given IV fluids in the ED.  Your lab work is stable.  No evidence of pneumonia or urinary tract infection.  Your CT scan does show progression of your pancreas mass with increased spread to your liver and lymph nodes.  Follow-up with Dr. Arbutus Ped this week for recheck.  Continue your medications for the sinus infection. Return to the ED with difficulty breathing, not able to eat or drink, persistent vomiting, confusion or any other concerns.

## 2022-07-13 NOTE — ED Notes (Signed)
Ambulated pt, pt was stable and stated they felt fine during the walk.

## 2022-07-13 NOTE — ED Triage Notes (Signed)
Pt present with ? Dehydation. Pt has history of Pancreatic, Lung and kidney cancer. Presently being treated for sinus infection.

## 2022-07-13 NOTE — ED Provider Notes (Signed)
[place] EMERGENCY DEPARTMENT AT [facility] Provider Note   CSN: [id number] Arrival date & time: 07/13/22  1537     History  Chief Complaint  Patient presents with   Dehydration    [name] is a 77 y.o. male.  Patient with a history of metastatic kidney cancer here with concern for "dehydration".  States he is felt poorly for the past 3 days with poor p.o. intake.  He has difficult time describing his symptoms but family is concerned he could be hyponatremic again.  The fact that he feels generally weak and has not been eating or drinking well.  No nausea no vomiting, no chest pain or shortness of breath.  No cough, runny nose, fever, pain with urination or blood in the urine.  He is currently on antifungal medication for fungal sinusitis no longer on chemotherapy because of this.  Family states he seems more weak and confused than his baseline.  He denies any abdominal pain, chest pain or shortness of breath.  The history is provided by the patient and a relative.       Home Medications Prior to Admission medications   Medication Sig Start Date End Date Taking? Authorizing Provider  acetaminophen (TYLENOL) 500 MG tablet Take 1,000 mg by mouth 2 (two) times daily.    [provider]  cabozantinib (CABOMETYX) 20 MG tablet Take 1 tablet (20 mg total) by mouth daily. Take on an empty stomach, 1 hour before or 2 hours after meals. 04/28/22   Si Gaul, MD  diazepam (VALIUM) 2 MG tablet Take 1 tablet (2 mg total) by mouth every 8 (eight) hours as needed for anxiety. 07/02/22   Pickenpack-Cousar, Arty Baumgartner, NP  diltiazem (CARDIZEM CD) 240 MG 24 hr capsule Take 1 capsule (240 mg total) by mouth in the morning. 03/26/22   Croitoru, Mihai, MD  ELIQUIS 5 MG TABS tablet Take 1 tablet (5 mg total) by mouth 2 (two) times daily. 08/11/21   Corrin Parker, PA-C  ezetimibe (ZETIA) 10 MG tablet Take 1 tablet (10 mg total) by mouth daily. Patient taking differently: Take  10 mg by mouth 2 (two) times a week. 03/27/22   Croitoru, Mihai, MD  fluticasone (FLONASE) 50 MCG/ACT nasal spray Place 2 sprays into both nostrils daily as needed for allergies. 01/20/22   [provider]  hydrocortisone (CORTEF) 10 MG tablet Take 1 tablet (10 mg total) by mouth at bedtime. 06/28/22 09/26/22  [place], Alvira Philips, DO  hydrocortisone (CORTEF) 20 MG tablet Take 1 tablet (20 mg total) by mouth every morning. 06/29/22 09/27/22  [place], Alvira Philips, DO  levothyroxine (SYNTHROID) 150 MCG tablet TAKE 1 TABLET BY MOUTH EVERY OTHER DAY Patient taking differently: Take 150 mcg by mouth every other day. 11/21/21   Carlus Pavlov, MD  levothyroxine (SYNTHROID) 175 MCG tablet TAKE 1 TABLET BY MOUTH EVERY OTHER DAY 11/21/21   Carlus Pavlov, MD  loratadine (CLARITIN) 10 MG tablet Take 10 mg by mouth daily as needed for allergies.    [provider]  morphine (MS CONTIN) 15 MG 12 hr tablet Take 1 tablet (15 mg total) by mouth every 12 (twelve) hours. 05/18/22   Heilingoetter, Cassandra L, PA-C  Multiple Vitamins-Minerals (CENTRUM SILVER 50+MEN) TABS Take 1 tablet by mouth daily with breakfast.    [provider]  mupirocin ointment (BACTROBAN) 2 % Place 1 Application into the nose daily as needed (sores).    [provider]  OLANZapine (ZYPREXA) 5 MG tablet Take  1 tablet (5 mg total) by mouth at bedtime. 06/28/22 09/26/22  [place], Eric J, DO  oxyCODONE-acetaminophen (PERCOCET/ROXICET) 5-325 MG tablet Take 1 tablet by mouth every 6 (six) hours as needed for severe pain. 06/04/22   Pickenpack-Cousar, Arty Baumgartner, NP  posaconazole (NOXAFIL) 100 MG TBEC delayed-release tablet Take 3 tablets (300 mg total) by mouth daily with breakfast. 06/29/22 09/27/22  [place], Alvira Philips, DO  traZODone (DESYREL) 150 MG tablet Take 150 mg by mouth at bedtime as needed for sleep.    [provider]  VITAMIN D PO Take 1 capsule by mouth daily.    [provider]      Allergies     Diphenhydramine    Review of Systems   Review of Systems  Constitutional:  Positive for activity change, appetite change and fatigue. Negative for fever.  HENT:  Negative for congestion.   Respiratory:  Negative for cough, chest tightness and shortness of breath.   Cardiovascular:  Negative for chest pain.  Gastrointestinal:  Positive for nausea. Negative for abdominal pain and vomiting.  Genitourinary:  Negative for dysuria and hematuria.  Musculoskeletal:  Positive for arthralgias and myalgias.  Skin:  Negative for rash.  Neurological:  Positive for weakness. Negative for dizziness and headaches.   all other systems are negative except as noted in the HPI and PMH.    Physical Exam Updated Vital Signs BP (!) 137/92 (BP Location: Right Arm)   Pulse 94   Temp 99.1 F (37.3 C) (Oral)   Resp 20   Ht 5\' 8"  (1.727 m)   Wt 79.8 kg   SpO2 94%   BMI 26.75 kg/m  Physical Exam Vitals and nursing note reviewed.  Constitutional:      General: He is not in acute distress.    Appearance: He is well-developed.  HENT:     Head: Normocephalic and atraumatic.     Mouth/Throat:     Pharynx: No oropharyngeal exudate.  Eyes:     Conjunctiva/sclera: Conjunctivae normal.     Pupils: Pupils are equal, round, and reactive to light.  Neck:     Comments: No meningismus. Cardiovascular:     Rate and Rhythm: Normal rate and regular rhythm.     Heart sounds: Normal heart sounds. No murmur heard. Pulmonary:     Effort: Pulmonary effort is normal. No respiratory distress.     Breath sounds: Normal breath sounds.  Abdominal:     Palpations: Abdomen is soft.     Tenderness: There is no abdominal tenderness. There is no guarding or rebound.  Musculoskeletal:        General: No tenderness. Normal range of motion.     Cervical back: Normal range of motion and neck supple.  Skin:    General: Skin is warm.  Neurological:     Mental Status: He is alert and oriented to person, place, and time.      Cranial Nerves: No cranial nerve deficit.     Motor: No abnormal muscle tone.     Coordination: Coordination normal.     Comments:  5/5 strength throughout. CN 2-12 intact.Equal grip strength.   Psychiatric:        Behavior: Behavior normal.     ED Results / Procedures / Treatments   Labs (all labs ordered are listed, but only abnormal results are displayed) Labs Reviewed  CBC WITH DIFFERENTIAL/PLATELET - Abnormal; Notable for the following components:      Result Value   WBC 13.5 (*)  RBC 3.66 (*)    Hemoglobin 9.5 (*)    HCT 31.2 (*)    RDW 16.0 (*)    Platelets 461 (*)    Neutro Abs 10.8 (*)    All other components within normal limits  COMPREHENSIVE METABOLIC PANEL - Abnormal; Notable for the following components:   Sodium 133 (*)    Chloride 95 (*)    Glucose, Bld 145 (*)    Calcium 8.3 (*)    Albumin 3.0 (*)    All other components within normal limits  URINALYSIS, ROUTINE W REFLEX MICROSCOPIC - Abnormal; Notable for the following components:   Protein, ur >=300 (*)    All other components within normal limits  CK - Abnormal; Notable for the following components:   Total CK 30 (*)    All other components within normal limits  TSH - Abnormal; Notable for the following components:   TSH 0.182 (*)    All other components within normal limits  TROPONIN I (HIGH SENSITIVITY) - Abnormal; Notable for the following components:   Troponin I (High Sensitivity) 45 (*)    All other components within normal limits  TROPONIN I (HIGH SENSITIVITY) - Abnormal; Notable for the following components:   Troponin I (High Sensitivity) 36 (*)    All other components within normal limits  AMMONIA  LACTIC ACID, PLASMA    EKG EKG Interpretation  Date/Time:  Monday Jul 13 2022 15:49:42 EDT Ventricular Rate:  94 PR Interval:  158 QRS Duration: 75 QT Interval:  354 QTC Calculation: 443 R Axis:   -17 Text Interpretation: Sinus rhythm Borderline left axis deviation No significant  change was found Confirmed by Glynn Octave [phone]) on 07/13/2022 5:07:39 PM  Radiology CT Maxillofacial W Contrast  Result Date: 07/13/2022 CLINICAL DATA:  Altered mental status, possible dehydration, history of pancreatic, lung, and kidney cancer, maxillary sinus mass EXAM: CT HEAD WITHOUT CONTRAST CT MAXILLOFACIAL WITHOUT CONTRAST TECHNIQUE: Multidetector CT imaging of the head and maxillofacial structures were performed using the standard protocol without intravenous contrast. Multiplanar CT image reconstructions of the maxillofacial structures were also generated. RADIATION DOSE REDUCTION: This exam was performed according to the departmental dose-optimization program which includes automated exposure control, adjustment of the mA and/or kV according to patient size and/or use of iterative reconstruction technique. COMPARISON:  None Available. FINDINGS: CT HEAD FINDINGS Brain: No evidence of acute infarction, hemorrhage, hydrocephalus, extra-axial collection or mass lesion/mass effect. Vascular: No hyperdense vessel or unexpected calcification. CT FACIAL BONES FINDINGS Skull: Normal. Negative for fracture or focal lesion. Facial bones: No displaced fractures or dislocations. Sinuses/Orbits: Similar appearance of erosion and destruction of the posterior wall of the left maxillary sinus with severe sinus wall thickening and air loculations and soft tissue in the left masticator space (series 5, image 13). Status post left maxillary antrostomy. Other: None. IMPRESSION: 1. No acute intracranial pathology. 2. No displaced fractures or dislocations of the facial bones. 3. Similar appearance of erosion and destruction of the posterior wall of the left maxillary sinus with severe sinus wall thickening and air loculations and soft tissue in the left masticator space. Differential considerations remain as previously described on examination dated 05/19/2022 and include chronic, aggressive infection with associated  soft tissue abscess component as well as metastatic from patient's known primary malignancies. Electronically Signed   By: Jearld Lesch M.D.   On: 07/13/2022 18:53   CT Head Wo Contrast  Result Date: 07/13/2022 CLINICAL DATA:  Altered mental status, possible dehydration, history of pancreatic, lung,  and kidney cancer, maxillary sinus mass EXAM: CT HEAD WITHOUT CONTRAST CT MAXILLOFACIAL WITHOUT CONTRAST TECHNIQUE: Multidetector CT imaging of the head and maxillofacial structures were performed using the standard protocol without intravenous contrast. Multiplanar CT image reconstructions of the maxillofacial structures were also generated. RADIATION DOSE REDUCTION: This exam was performed according to the departmental dose-optimization program which includes automated exposure control, adjustment of the mA and/or kV according to patient size and/or use of iterative reconstruction technique. COMPARISON:  None Available. FINDINGS: CT HEAD FINDINGS Brain: No evidence of acute infarction, hemorrhage, hydrocephalus, extra-axial collection or mass lesion/mass effect. Vascular: No hyperdense vessel or unexpected calcification. CT FACIAL BONES FINDINGS Skull: Normal. Negative for fracture or focal lesion. Facial bones: No displaced fractures or dislocations. Sinuses/Orbits: Similar appearance of erosion and destruction of the posterior wall of the left maxillary sinus with severe sinus wall thickening and air loculations and soft tissue in the left masticator space (series 5, image 13). Status post left maxillary antrostomy. Other: None. IMPRESSION: 1. No acute intracranial pathology. 2. No displaced fractures or dislocations of the facial bones. 3. Similar appearance of erosion and destruction of the posterior wall of the left maxillary sinus with severe sinus wall thickening and air loculations and soft tissue in the left masticator space. Differential considerations remain as previously described on examination dated  05/19/2022 and include chronic, aggressive infection with associated soft tissue abscess component as well as metastatic from patient's known primary malignancies. Electronically Signed   By: Jearld Lesch M.D.   On: 07/13/2022 18:53   CT ABDOMEN PELVIS W CONTRAST  Result Date: 07/13/2022 CLINICAL DATA:  Abdominal pain, acute, nonlocalized EXAM: CT ABDOMEN AND PELVIS WITH CONTRAST TECHNIQUE: Multidetector CT imaging of the abdomen and pelvis was performed using the standard protocol following bolus administration of intravenous contrast. RADIATION DOSE REDUCTION: This exam was performed according to the departmental dose-optimization program which includes automated exposure control, adjustment of the mA and/or kV according to patient size and/or use of iterative reconstruction technique. CONTRAST:  OMNIPAQUE IOHEXOL 350 MG/ML SOLN COMPARISON:  Radiograph earlier today. CT 05/03/2022 FINDINGS: Lower chest: Assessed on concurrent chest CTA, reported separately. Hepatobiliary: 4.2 cm hypodense lesion in the left hepatic lobe series 8, image 40, grossly unchanged from prior. There additional subtle hypodense lesions in the right hepatic lobe series 8, image 16, 17, and 35. Lack of contrast on prior exam makes comparison difficult to assess for acuity. Cholecystectomy. Common bile duct is normal in caliber. Pancreas: Heterogeneous mass in the uncinate process of the pancreas has increased in size from prior exam, representative measurement 6.9 x 5.6 cm, series 8, image 41. Pancreatic mass abuts the main portal vein. Adjacent pancreatic nodule measuring 19 mm, series 8, image 32, increased from 8 mm. Peripancreatic node anteriorly measures 12 mm series 8, image 46, unchanged. Mild pancreatic ductal dilatation at 5 mm. Faint stranding about the pancreatic tail is similar, unchanged. Spleen: Normal in size without focal abnormality. Adrenals/Urinary Tract: Normal adrenal glands. Prior right nephrectomy with  stable appearance of fat necrosis in the nephrectomy bed. There is no left hydronephrosis. Tiny subcentimeter hypodensities in the left kidney are too small to characterize, no specific imaging follow-up is needed. There is excreted IV contrast in the urinary bladder which is only minimally distended. Stomach/Bowel: Colonic diverticulosis which is prominent in the sigmoid. No diverticulitis. The uncinate process mass is intimately related to the second and third portion of the duodenum as before. No evidence of gastric, duodenal, or small bowel obstruction.  Appendix is not seen. Vascular/Lymphatic: Advanced aortic atherosclerosis. Left periaortic nodes are increasing, for example 11 mm, series 8, image 51, previously 9 mm. Peripancreatic lymph nodes as described above. Reproductive: Prostate is unremarkable. Other: Trace free fluid in the pelvis. No free intra-abdominal air. Musculoskeletal: There are no acute or suspicious osseous abnormalities. IMPRESSION: 1. Known pancreatic mass is increased in size from prior exam. Increased size of peripancreatic and retroperitoneal nodes. 2. Slight increased size of the dominant right hepatic lesion. There are multiple small hypodense liver lesions suspicious for metastasis, unclear if these are new from prior exam is previous or not obtained with contrast. 3. Colonic diverticulosis without diverticulitis. 4. Prior right nephrectomy with stable appearance of fat necrosis in the nephrectomy bed. Aortic Atherosclerosis (ICD10-I70.0). Electronically Signed   By: Narda Rutherford M.D.   On: 07/13/2022 18:46   CT Angio Chest PE W and/or Wo Contrast  Result Date: 07/13/2022 CLINICAL DATA:  Pulmonary embolism (PE) suspected, high prob EXAM: CT ANGIOGRAPHY CHEST WITH CONTRAST TECHNIQUE: Multidetector CT imaging of the chest was performed using the standard protocol during bolus administration of intravenous contrast. Multiplanar CT image reconstructions and MIPs were obtained to  evaluate the vascular anatomy. Performed in conjunction with CT of the abdomen and pelvis, reported separately. RADIATION DOSE REDUCTION: This exam was performed according to the departmental dose-optimization program which includes automated exposure control, adjustment of the mA and/or kV according to patient size and/or use of iterative reconstruction technique. CONTRAST:  OMNIPAQUE IOHEXOL 350 MG/ML SOLN COMPARISON:  Radiograph earlier today. Chest CT 05/03/2022 FINDINGS: Cardiovascular: There are no filling defects within the pulmonary arteries to suggest pulmonary embolus. Atherosclerosis of the thoracic aorta. No acute aortic findings. Upper normal heart size. Coronary artery calcifications. No significant pericardial effusion. Mediastinum/Nodes: No mediastinal or hilar adenopathy. No thyroid nodule. Decompressed esophagus. Lungs/Pleura: Prior right lower lobectomy. Mild scarring in the right hemithorax related to prior surgery. There is bronchial thickening without focal airspace disease. Mild mosaic attenuation of pulmonary parenchyma. 4 mm sub solid nodule in the left lower lobe series 12, image 67 is unchanged prior exam. Recommend attention at oncologic follow-up. Intra no new pulmonary nodules. No pleural effusion. Upper Abdomen: Assessed on concurrent abdominal CT, reported separately. Musculoskeletal: There are no acute or suspicious osseous abnormalities. Schmorl's nodes throughout the spine. No chest wall soft tissue abnormalities. Review of the MIP images confirms the above findings. IMPRESSION: 1. No pulmonary embolus. 2. Bronchial thickening with mild mosaic attenuation of pulmonary parenchyma, can be seen with small airways disease. 3. Prior right lower lobectomy. Aortic Atherosclerosis (ICD10-I70.0). Electronically Signed   By: Narda Rutherford M.D.   On: 07/13/2022 18:34   DG Abd Portable 2 Views  Result Date: 07/13/2022 CLINICAL DATA:  Abdominal pain. Dehydration. Pancreatic, lung,  and renal carcinoma. EXAM: PORTABLE ABDOMEN - 2 VIEW COMPARISON:  [facility]UB 03/14/2018, CT abdomen and pelvis 05/03/2022 FINDINGS: Air is seen throughout the colon common continuity descending colon and sigmoid colon distally. Overall paucity of small bowel gas. No dilated loops of bowel are seen. No free air is seen on right-sided up decubitus view. No portal venous gas or pneumatosis. Moderate L5-S1 and mild L4-5 degenerative disc and endplate changes. Cholecystectomy clips. Unchanged sclerotic focus within the right proximal femoral intertrochanteric region compared to 12/26/2019 CT, likely a bone island. Vascular phleboliths overlie the pelvis. IMPRESSION: Overall paucity of small bowel gas. No dilated loops of bowel to indicate bowel obstruction. Electronically Signed   By: Kerin Salen.D.  On: 07/13/2022 17:05   DG Chest 2 View  Result Date: 07/13/2022 CLINICAL DATA:  Pancreatic, lung, and renal cancer. Altered mental status. Being treated for sinus infection. EXAM: CHEST - 2 VIEW COMPARISON:  Chest radiographs 06/24/2022, 07/09/2021, 03/25/2021 FINDINGS: Cardiac silhouette and mediastinal contours are within limits. Surgical clips overlie the right hilum status post right lower lung partial resection. There is chronic blunting of the right costophrenic angle, postsurgical scarring. The lungs are clear. No pleural effusion or pneumothorax. Mild multilevel degenerative disc changes of the thoracic spine. IMPRESSION: 1. No active cardiopulmonary disease. 2. Postsurgical changes of the right lung. Electronically Signed   By: Neita Garnet M.D.   On: 07/13/2022 17:00    Procedures Procedures    Medications Ordered in ED Medications  lactated ringers bolus 1,000 mL (has no administration in time range)    ED Course/ Medical Decision Making/ A&P                             Medical Decision Making Amount and/or Complexity of Data Reviewed Labs: ordered. Decision-making details documented in ED  Course. Radiology: ordered and independent interpretation performed. Decision-making details documented in ED Course. ECG/medicine tests: ordered and independent interpretation performed. Decision-making details documented in ED Course.  Risk Prescription drug management.   Patient metastatic cancer here with concern for dehydration and generalized weakness.  Vitals are stable, no distress.  No fever.  Abdomen soft without peritoneal signs.  Vitals are stable.  Labs show leukocytosis of 13, sodium is 133.  Creatinine is at baseline.  IVF given. Orthostatics obtained.  Blood pressure did drop from 126 systolic to 107 systolic.  Heart rate did not increase. UA negative.   Workup does not show obvious source of his weakness and feeling poorly.  No evidence of pulmonary embolism or pneumonia.  No evidence of bowel obstruction.  CT head shows stable destructive lesion of left maxillary sinus.  Pancreatic mass does appear to be slightly increased with increased lymphadenopathy.  Workup discussed with oncology Dr. Leonides Schanz.  He agrees known definite indication for admission.  Patient would prefer to go home.  He is given IV fluids in the ED. Progression of disease discussed with patient and daughter at bedside.  Discussed with Dr. Leonides Schanz as well. they will arrange for Dr. Arbutus Ped to call patient for follow-up tomorrow.  No change in his chronic cortisone at this time.  Patient offered but patient would prefer to go home.  He is tolerating p.o. and able to ambulate.  Stable vital signs.  No indication for admission based on labs and imaging. Close follow-up with oncology as above.  Return precautions discussed.  Patient able to ambulate and tolerate p.o.  He wants to go home.  Will follow-up with oncology this week.  Patient and family made aware of worsening disease seen on CT scan.  Continue medications and current steroid dose at this time.  Return to the ED with new or worsening  symptoms.       Final Clinical Impression(s) / ED Diagnoses Final diagnoses:  Dehydration    Rx / DC Orders ED Discharge Orders     None         Keona Bilyeu, Jeannett Senior, MD 07/13/22 [phone]

## 2022-07-13 NOTE — ED Notes (Addendum)
Attempted to ambulate the pt, pt is unwilling to get up at this moment.

## 2022-07-14 ENCOUNTER — Telehealth: Payer: Self-pay | Admitting: Medical Oncology

## 2022-07-14 ENCOUNTER — Telehealth: Payer: Self-pay

## 2022-07-14 NOTE — Telephone Encounter (Signed)
I returned her call and had to LVM to return my call.

## 2022-07-14 NOTE — Telephone Encounter (Signed)
Pt daughter LVM over holiday weekend, RN returned pt call and pt had gone to ED for weakness and confusion. Per ED provider pt should be seen soon to review ED scans, inofrmation passed along to oncology team. Pt daughter informed of return to ED precautions, verbalized understadning, no further needs at this time.

## 2022-07-17 ENCOUNTER — Telehealth: Payer: Self-pay

## 2022-07-17 ENCOUNTER — Telehealth: Payer: Self-pay | Admitting: Internal Medicine

## 2022-07-17 NOTE — Telephone Encounter (Signed)
Scheduled per 05/30 scheduling message, patient has been called and notified.

## 2022-07-17 NOTE — Telephone Encounter (Signed)
Transition Care Management Follow-up Telephone Call Date of discharge and from where: [name] 5/27 How have you been since you were released from the hospital? Same, weak and tired  Any questions or concerns? Yes  Items Reviewed: Did the pt receive and understand the discharge instructions provided? Yes  Medications obtained and verified? Yes  Other? No  Any new allergies since your discharge? No  Dietary orders reviewed? No Do you have support at home? Yes     Follow up appointments reviewed:  PCP Hospital f/u appt confirmed? NO Scheduled to see  on  @ . Specialist Hospital f/u appt confirmed? No  Scheduled to see  on  @ . Are transportation arrangements needed? No  If their condition worsens, is the pt aware to call PCP or go to the Emergency Dept.? Yes Was the patient provided with contact information for the PCP's office or ED? Yes Was to pt encouraged to call back with questions or concerns? Yes

## 2022-07-20 ENCOUNTER — Other Ambulatory Visit: Payer: Medicare HMO

## 2022-07-21 ENCOUNTER — Other Ambulatory Visit: Payer: Self-pay | Admitting: Internal Medicine

## 2022-07-22 ENCOUNTER — Ambulatory Visit: Payer: Medicare HMO | Admitting: Family

## 2022-07-22 ENCOUNTER — Ambulatory Visit: Payer: Medicare HMO | Admitting: Internal Medicine

## 2022-07-22 ENCOUNTER — Encounter: Payer: Self-pay | Admitting: Family

## 2022-07-22 ENCOUNTER — Other Ambulatory Visit: Payer: Self-pay

## 2022-07-22 VITALS — BP 158/81 | HR 75 | Temp 98.0°F | Resp 16 | Wt 166.0 lb

## 2022-07-22 DIAGNOSIS — B4489 Other forms of aspergillosis: Secondary | ICD-10-CM

## 2022-07-22 DIAGNOSIS — B49 Unspecified mycosis: Secondary | ICD-10-CM

## 2022-07-22 DIAGNOSIS — J329 Chronic sinusitis, unspecified: Secondary | ICD-10-CM | POA: Diagnosis not present

## 2022-07-22 NOTE — Assessment & Plan Note (Signed)
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 I do not think that he has been on the posaconazole long enough (3 weeks) that we would see significant improvements although he does have some small improvements since starting the medication. Tolerating medication with no adverse side effects. Discussed recommendations to continue current dose of posaconazole to see if further improvements are obtained. Radiation-oncology following and considering possible therapy and has follow up with Oncology tomorrow. Will arrange follow up with Dr. Renold Don or Dr. Thedore Mins in 1 month or sooner if needed.

## 2022-07-22 NOTE — Patient Instructions (Signed)
Nice to see you.  Continue to take your medication daily as prescribed.  Refills have been sent to the pharmacy.  Plan for follow up in 1 month with Dr. Renold Don or Dr. Thedore Mins.  Have a great day and stay safe!

## 2022-07-22 NOTE — Progress Notes (Signed)
Subjective:    Patient ID: [name], male    DOB: [birth year], 77 y.o.   MRN: [id number]  Chief Complaint  Patient presents with   Follow-up    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 fumigatus -patient report he isn't feeling well.     HPI:  [name] is a 77 y.o. male Aspergillus fumigatas invasive fungal sinusitis in the setting of Stage IV renal cell carcinoma complicated by metastatic disease with concern for maxillarymetastatic disease versus fungal infection last seen by Dr. Earlene Plater on on 06/30/22 following side effects from voriconazole and switched to posaconazole starting on 06/24/22. CT head on 07/13/22 with similar appearance of erosions and destruction of the posterior wall of the left maxillary sinus. Here today for follow up.  [name] has been taking the posaconazole as prescribed and has been having increased amounts of fatigue and continues to have jaw pain although the numbness along the left side of the maxillary sinus area feels less numb since starting medication. No other adverse side effects thus far. Has follow up with Oncology tomorrow. There is consideration for radiation treatment as well. Has not had much appetite and oral intake is decreased.     Allergies  Allergen Reactions   Diphenhydramine Other (See Comments)    Severe restless legs      Outpatient Medications Prior to Visit  Medication Sig Dispense Refill   acetaminophen (TYLENOL) 500 MG tablet Take 1,000 mg by mouth 2 (two) times daily.     cabozantinib (CABOMETYX) 20 MG tablet Take 1 tablet (20 mg total) by mouth daily. Take on an empty stomach, 1 hour before or 2 hours after meals. 30 tablet 1   diazepam (VALIUM) 2 MG tablet Take 1 tablet (2 mg total) by mouth every 8 (eight) hours as needed for anxiety. 45 tablet 0   diltiazem (CARDIZEM CD) 240 MG 24 hr capsule Take 1 capsule (240 mg total) by mouth in the morning. 90 capsule 3   ELIQUIS 5 MG TABS tablet Take 1 tablet (5 mg total) by mouth 2 (two) times daily.  60 tablet 2   ezetimibe (ZETIA) 10 MG tablet Take 1 tablet (10 mg total) by mouth daily. (Patient taking differently: Take 10 mg by mouth 2 (two) times a week.) 90 tablet 3   fluticasone (FLONASE) 50 MCG/ACT nasal spray Place 2 sprays into both nostrils daily as needed for allergies.     hydrocortisone (CORTEF) 10 MG tablet Take 1 tablet (10 mg total) by mouth at bedtime. 90 tablet 0   hydrocortisone (CORTEF) 20 MG tablet Take 1 tablet (20 mg total) by mouth every morning. 90 tablet 0   levothyroxine (SYNTHROID) 150 MCG tablet Take 1 tablet (150 mcg total) by mouth every other day. 30 tablet 3   levothyroxine (SYNTHROID) 175 MCG tablet TAKE 1 TABLET BY MOUTH EVERY OTHER DAY 30 tablet 3   loratadine (CLARITIN) 10 MG tablet Take 10 mg by mouth daily as needed for allergies.     morphine (MS CONTIN) 15 MG 12 hr tablet Take 1 tablet (15 mg total) by mouth every 12 (twelve) hours. 30 tablet 0   Multiple Vitamins-Minerals (CENTRUM SILVER 50+MEN) TABS Take 1 tablet by mouth daily with breakfast.     mupirocin ointment (BACTROBAN) 2 % Place 1 Application into the nose daily as needed (sores).     OLANZapine (ZYPREXA) 5 MG tablet Take 1 tablet (5 mg total) by mouth at bedtime. 90 tablet 0   oxyCODONE-acetaminophen (PERCOCET/ROXICET) 5-325 MG tablet Take  1 tablet by mouth every 6 (six) hours as needed for severe pain. 60 tablet 0   posaconazole (NOXAFIL) 100 MG TBEC delayed-release tablet Take 3 tablets (300 mg total) by mouth daily with breakfast. 90 tablet 2   traZODone (DESYREL) 150 MG tablet Take 150 mg by mouth at bedtime as needed for sleep.     VITAMIN D PO Take 1 capsule by mouth daily.     No facility-administered medications prior to visit.     Past Medical History:  Diagnosis Date   Arthritis    Chronic kidney disease    only has one kidney    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 02/01/2015   Coronary artery disease    followed by Dr.Tilley   Frequent PVCs     GERD (gastroesophageal reflux disease)    Heart murmur    never has caused any problems   Hx of cancer of lung 1980's   Hyperlipidemia    Hypertension    Hypothyroidism    Incisional hernia 08/01/2015   Lung cancer (HCC) 1993   Lung metastases 2019   Pneumonia    x several   Recurrent umbilical hernia 08/01/2015     Past Surgical History:  Procedure Laterality Date   APPENDECTOMY  age 43   ARTERY BIOPSY Left 04/10/2022   Procedure: LEFT TEMPORAL ARTERY BIOPSY;  Surgeon: Chuck Hint, MD;  [facility] OR;  Service: Vascular;  Laterality: Left;   CHOLECYSTECTOMY  10/18/1978   EYE SURGERY Bilateral 2019   cataract   LAPAROSCOPIC LYSIS OF ADHESIONS N/A 08/01/2015   Procedure: LAPAROSCOPIC LYSIS OF ADHESIONS;  Surgeon: Karie Soda, MD;  Location: [facility] ORS;  Service: General;  Laterality: N/A;   LUNG LOBECTOMY  02/16/1990   lung cancer- patient has staples in lung not to have MRI per patient    MAXILLARY ANTROSTOMY Left 05/22/2022   Procedure: MAXILLARY ANTROSTOMY;  Surgeon: Laren Boom, DO;  Location: [facility] OR;  Service: ENT;  Laterality: Left;   PILONIDAL CYST EXCISION  02/16/2009   Dr Gerrit Friends   ROBOTIC ASSITED PARTIAL NEPHRECTOMY Right 02/01/2015   Procedure: RIGHT ROBOTIC ASSISTED LAPAROSOCOPY NEPHRECTOMY;  Surgeon: Crist Fat, MD;  Location: [facility] ORS;  Service: Urology;  Laterality: Right;   SINUS ENDO WITH FUSION Left 05/22/2022   Procedure: SINUS ENDO WITH FUSION;  Surgeon: Laren Boom, DO;  Location: [facility] OR;  Service: ENT;  Laterality: Left;   VENTRAL HERNIA REPAIR N/A 08/01/2015   Procedure: LAPAROSCOPIC VENTRAL WALL HERNIA WITH MESH;  Surgeon: Karie Soda, MD;  Location: [facility] ORS;  Service: General;  Laterality: N/A;       Review of Systems  Constitutional:  Positive for appetite change. Negative for chills, diaphoresis, fatigue and fever.  HENT:         Jaw pain  Respiratory:  Negative for cough, chest tightness, shortness of breath and  wheezing.   Cardiovascular:  Negative for chest pain.  Gastrointestinal:  Negative for abdominal pain, diarrhea, nausea and vomiting.      Objective:    BP (!) 158/81   Pulse 75   Temp 98 F (36.7 C) (Oral)   Resp 16   Wt 166 lb (75.3 kg)   SpO2 97%   BMI 25.24 kg/m  Nursing note and vital signs reviewed.  Physical Exam Constitutional:      General: He is not in acute distress.    Appearance: He is well-developed. He is ill-appearing.  Cardiovascular:  Rate and Rhythm: Normal rate and regular rhythm.     Heart sounds: Normal heart sounds.  Pulmonary:     Effort: Pulmonary effort is normal.     Breath sounds: Normal breath sounds.  Skin:    General: Skin is warm and dry.  Neurological:     Mental Status: He is alert.  Psychiatric:        Mood and Affect: Mood normal.         07/22/2022   11:09 AM 06/30/2022    2:52 PM 06/10/2022    9:03 AM  Depression screen PHQ 2/9  Decreased Interest 0 0 0  Down, Depressed, Hopeless 0 0 1  PHQ - 2 Score 0 0 1       Assessment & Plan:    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) 06/10/2022   Maxillary sinusitis 05/22/2022   Jaw pain 03/31/2022   Adrenal insufficiency (HCC) 08/23/2018   Atherosclerosis of aorta (HCC) 08/23/2018   Acquired hypothyroidism 04/19/2018   Secondary adrenal insufficiency (HCC) 04/19/2018   Acute hyponatremia 03/24/2018   Essential hypertension    Protein-calorie malnutrition, severe 03/14/2018   Generalized weakness 03/13/2018    Hyperlipidemia 11/30/2017   Kidney cancer, primary, with metastasis from kidney to other site [facility]) 09/14/2017   Recurrent umbilical hernia 08/01/2015   Incisional hernia 08/01/2015   Hypertensive heart disease without CHF    GERD (gastroesophageal reflux disease)    Chronic kidney disease    Coronary artery disease    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 02/01/2015     Problem List Items Addressed This Visit       Respiratory   Invasive fungal sinusitis - Primary    Mr. Durflinger's CT scan with similar appearance of erosions leaving concern for invasive Aspergillus infection versus malignancy. I do not think that he has been on the posaconazole long enough (3 weeks) that we would see significant improvements although he does have some small improvements since starting the medication. Tolerating medication with no adverse side effects. Discussed recommendations to continue current dose of posaconazole to see if further improvements are obtained. Radiation-oncology following and considering possible therapy and has follow up with Oncology tomorrow. Will arrange follow up with Dr. Renold Don or Dr. Thedore Mins in 1 month or sooner if needed.         Other   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)     I am having Zaivion Hill maintain his traZODone, Centrum Silver 50+Men, Eliquis, diltiazem, ezetimibe, fluticasone, Cabometyx, morphine, loratadine, mupirocin ointment, VITAMIN D PO, acetaminophen, oxyCODONE-acetaminophen, hydrocortisone, hydrocortisone, OLANZapine, posaconazole, diazepam, levothyroxine, and levothyroxine.   Follow-up: Return in about 1 month (around 08/21/2022), or if symptoms worsen or fail to improve.   Marcos Eke, MSN, FNP-C Nurse Practitioner [facility] for Infectious Disease [facility] Main number: [phone]

## 2022-07-23 ENCOUNTER — Telehealth: Payer: Self-pay | Admitting: Pharmacy Technician

## 2022-07-23 ENCOUNTER — Other Ambulatory Visit: Payer: Self-pay

## 2022-07-23 ENCOUNTER — Other Ambulatory Visit (HOSPITAL_COMMUNITY): Payer: Self-pay

## 2022-07-23 ENCOUNTER — Telehealth: Payer: Self-pay | Admitting: Medical Oncology

## 2022-07-23 ENCOUNTER — Inpatient Hospital Stay (HOSPITAL_BASED_OUTPATIENT_CLINIC_OR_DEPARTMENT_OTHER): Payer: Medicare HMO | Admitting: Nurse Practitioner

## 2022-07-23 ENCOUNTER — Encounter: Payer: Self-pay | Admitting: Nurse Practitioner

## 2022-07-23 ENCOUNTER — Inpatient Hospital Stay: Payer: Medicare HMO | Attending: Oncology

## 2022-07-23 ENCOUNTER — Inpatient Hospital Stay: Payer: Medicare HMO | Admitting: Internal Medicine

## 2022-07-23 ENCOUNTER — Other Ambulatory Visit: Payer: Self-pay | Admitting: Internal Medicine

## 2022-07-23 ENCOUNTER — Telehealth: Payer: Self-pay | Admitting: Pharmacist

## 2022-07-23 VITALS — BP 156/73 | HR 80 | Temp 97.9°F | Resp 18 | Wt 164.2 lb

## 2022-07-23 DIAGNOSIS — D649 Anemia, unspecified: Secondary | ICD-10-CM | POA: Insufficient documentation

## 2022-07-23 DIAGNOSIS — C649 Malignant neoplasm of unspecified kidney, except renal pelvis: Secondary | ICD-10-CM

## 2022-07-23 DIAGNOSIS — Z905 Acquired absence of kidney: Secondary | ICD-10-CM | POA: Diagnosis not present

## 2022-07-23 DIAGNOSIS — Z515 Encounter for palliative care: Secondary | ICD-10-CM

## 2022-07-23 DIAGNOSIS — C778 Secondary and unspecified malignant neoplasm of lymph nodes of multiple regions: Secondary | ICD-10-CM | POA: Diagnosis not present

## 2022-07-23 DIAGNOSIS — C787 Secondary malignant neoplasm of liver and intrahepatic bile duct: Secondary | ICD-10-CM | POA: Insufficient documentation

## 2022-07-23 DIAGNOSIS — G893 Neoplasm related pain (acute) (chronic): Secondary | ICD-10-CM

## 2022-07-23 DIAGNOSIS — C7889 Secondary malignant neoplasm of other digestive organs: Secondary | ICD-10-CM | POA: Insufficient documentation

## 2022-07-23 DIAGNOSIS — C641 Malignant neoplasm of right kidney, except renal pelvis: Secondary | ICD-10-CM | POA: Diagnosis not present

## 2022-07-23 DIAGNOSIS — F419 Anxiety disorder, unspecified: Secondary | ICD-10-CM

## 2022-07-23 DIAGNOSIS — R530 Neoplastic (malignant) related fatigue: Secondary | ICD-10-CM | POA: Diagnosis not present

## 2022-07-23 DIAGNOSIS — R6884 Jaw pain: Secondary | ICD-10-CM | POA: Diagnosis not present

## 2022-07-23 DIAGNOSIS — Z923 Personal history of irradiation: Secondary | ICD-10-CM | POA: Diagnosis not present

## 2022-07-23 DIAGNOSIS — G4709 Other insomnia: Secondary | ICD-10-CM | POA: Diagnosis not present

## 2022-07-23 DIAGNOSIS — Z9221 Personal history of antineoplastic chemotherapy: Secondary | ICD-10-CM | POA: Diagnosis not present

## 2022-07-23 DIAGNOSIS — E860 Dehydration: Secondary | ICD-10-CM | POA: Diagnosis not present

## 2022-07-23 LAB — CBC WITH DIFFERENTIAL/PLATELET
Abs Immature Granulocytes: 0.03 10*3/uL (ref 0.00–0.07)
Basophils Absolute: 0 10*3/uL (ref 0.0–0.1)
Basophils Relative: 0 %
Eosinophils Absolute: 0.1 10*3/uL (ref 0.0–0.5)
Eosinophils Relative: 2 %
HCT: 28.5 % — ABNORMAL LOW (ref 39.0–52.0)
Hemoglobin: 8.6 g/dL — ABNORMAL LOW (ref 13.0–17.0)
Immature Granulocytes: 0 %
Lymphocytes Relative: 11 %
Lymphs Abs: 0.9 10*3/uL (ref 0.7–4.0)
MCH: 25.2 pg — ABNORMAL LOW (ref 26.0–34.0)
MCHC: 30.2 g/dL (ref 30.0–36.0)
MCV: 83.6 fL (ref 80.0–100.0)
Monocytes Absolute: 0.7 10*3/uL (ref 0.1–1.0)
Monocytes Relative: 8 %
Neutro Abs: 6.4 10*3/uL (ref 1.7–7.7)
Neutrophils Relative %: 79 %
Platelets: 408 10*3/uL — ABNORMAL HIGH (ref 150–400)
RBC: 3.41 MIL/uL — ABNORMAL LOW (ref 4.22–5.81)
RDW: 16.1 % — ABNORMAL HIGH (ref 11.5–15.5)
WBC: 8.2 10*3/uL (ref 4.0–10.5)
nRBC: 0 % (ref 0.0–0.2)

## 2022-07-23 LAB — COMPREHENSIVE METABOLIC PANEL
ALT: 18 U/L (ref 0–44)
AST: 12 U/L — ABNORMAL LOW (ref 15–41)
Albumin: 3.7 g/dL (ref 3.5–5.0)
Alkaline Phosphatase: 77 U/L (ref 38–126)
Anion gap: 9 (ref 5–15)
BUN: 10 mg/dL (ref 8–23)
CO2: 33 mmol/L — ABNORMAL HIGH (ref 22–32)
Calcium: 9.1 mg/dL (ref 8.9–10.3)
Chloride: 99 mmol/L (ref 98–111)
Creatinine, Ser: 0.91 mg/dL (ref 0.61–1.24)
GFR, Estimated: >60 mL/min (ref >60)
Glucose, Bld: 117 mg/dL — ABNORMAL HIGH (ref 70–99)
Potassium: 3.1 mmol/L — ABNORMAL LOW (ref 3.5–5.1)
Sodium: 141 mmol/L (ref 135–145)
Total Bilirubin: 0.4 mg/dL (ref 0.3–1.2)
Total Protein: 7.3 g/dL (ref 6.5–8.1)

## 2022-07-23 MED ORDER — AXITINIB 1 MG PO TABS
2.0000 mg | ORAL_TABLET | Freq: Two times a day (BID) | ORAL | 0 refills | Status: DC
Start: 2022-07-23 — End: 2022-09-01
  Filled 2022-07-23 (×2): qty 120, 30d supply, fill #0

## 2022-07-23 MED ORDER — AXITINIB 1 MG PO TABS
2.0000 mg | ORAL_TABLET | Freq: Two times a day (BID) | ORAL | 0 refills | Status: DC
Start: 2022-07-23 — End: 2022-07-23
  Filled 2022-07-23: qty 60, 15d supply, fill #0

## 2022-07-23 MED ORDER — POTASSIUM CHLORIDE CRYS ER 20 MEQ PO TBCR: 20.0000 meq | EXTENDED_RELEASE_TABLET | Freq: Every day | ORAL | 0 refills | Status: DC

## 2022-07-23 MED ORDER — AXITINIB 5 MG PO TABS
5.0000 mg | ORAL_TABLET | Freq: Two times a day (BID) | ORAL | 2 refills | Status: DC
  Filled 2022-07-23: qty 60, 30d supply, fill #0

## 2022-07-23 NOTE — Progress Notes (Signed)
Palliative Medicine [facility]  [phone] [phone]   Name: [name] Date: 07/23/2022 MRN: [id number]  DOB: 07/12/1945  Patient Care Team: [name], MD as PCP - General (Family Medicine) Thurmon Fair, MD as PCP - Cardiology (Cardiology) Karie Soda, MD as Consulting Physician (General Surgery) Crist Fat, MD as Consulting Physician (Urology)    INTERVAL HISTORY: [name] is a 77 y.o. 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 Palliative ask to see for symptom and pain management and goals of care.   SOCIAL HISTORY:    [name] reports that he quit smoking about 34 years ago. His smoking use included cigarettes. He has a 20.00 pack-year smoking history. He has never used smokeless tobacco. He reports that he does not currently use alcohol. He reports that he does not use drugs.  ADVANCE DIRECTIVES:  None on file CODE STATUS: Full code  PAST MEDICAL HISTORY: Past Medical History:  Diagnosis Date   Arthritis    Chronic kidney disease    only has one kidney    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 02/01/2015   Coronary artery disease    followed by Dr.Tilley   Frequent PVCs    GERD (gastroesophageal reflux disease)    Heart murmur    never has caused any problems   Hx of cancer of lung 1980's   Hyperlipidemia    Hypertension    Hypothyroidism    Incisional hernia 08/01/2015   Lung cancer (HCC) 1993   Lung metastases 2019   Pneumonia    x several   Recurrent umbilical hernia 08/01/2015    ALLERGIES:  is allergic to diphenhydramine.  MEDICATIONS:  Current Outpatient Medications  Medication Sig Dispense Refill   acetaminophen (TYLENOL) 500 MG tablet Take 1,000 mg by mouth 2 (two) times daily.     axitinib (INLYTA) 5 MG tablet Take  1 tablet (5 mg total) by mouth 2 (two) times daily. 60 tablet 2   diazepam (VALIUM) 2 MG tablet Take 1 tablet (2 mg total) by mouth every 8 (eight) hours as needed for anxiety. 45 tablet 0   diltiazem (CARDIZEM CD) 240 MG 24 hr capsule Take 1 capsule (240 mg total) by mouth in the morning. 90 capsule 3   ELIQUIS 5 MG TABS tablet Take 1 tablet (5 mg total) by mouth 2 (two) times daily. 60 tablet 2   ezetimibe (ZETIA) 10 MG tablet Take 1 tablet (10 mg total) by mouth daily. (Patient taking differently: Take 10 mg by mouth 2 (two) times a week.) 90 tablet 3   fluticasone (FLONASE) 50 MCG/ACT nasal spray Place 2 sprays into both nostrils daily as needed for allergies.     hydrocortisone (CORTEF) 10 MG tablet Take 1 tablet (10 mg total) by mouth at bedtime. 90 tablet 0   hydrocortisone (CORTEF) 20 MG tablet Take 1 tablet (20 mg total) by mouth every morning. 90 tablet 0   levothyroxine (SYNTHROID) 150 MCG tablet Take 1 tablet (150 mcg total) by mouth every other day. 30 tablet 3   levothyroxine (SYNTHROID) 175 MCG tablet TAKE 1 TABLET BY MOUTH EVERY OTHER DAY 30 tablet 3   loratadine (CLARITIN) 10 MG tablet Take 10 mg by mouth daily as needed for allergies.     morphine (MS CONTIN) 15 MG 12 hr tablet Take 1 tablet (15  mg total) by mouth every 12 (twelve) hours. 30 tablet 0   Multiple Vitamins-Minerals (CENTRUM SILVER 50+MEN) TABS Take 1 tablet by mouth daily with breakfast.     mupirocin ointment (BACTROBAN) 2 % Place 1 Application into the nose daily as needed (sores).     OLANZapine (ZYPREXA) 5 MG tablet Take 1 tablet (5 mg total) by mouth at bedtime. 90 tablet 0   oxyCODONE-acetaminophen (PERCOCET/ROXICET) 5-325 MG tablet Take 1 tablet by mouth every 6 (six) hours as needed for severe pain. 60 tablet 0   posaconazole (NOXAFIL) 100 MG TBEC delayed-release tablet Take 3 tablets (300 mg total) by mouth daily with breakfast. 90 tablet 2   traZODone (DESYREL) 150 MG tablet Take 150 mg by mouth at bedtime  as needed for sleep.     VITAMIN D PO Take 1 capsule by mouth daily.     No current facility-administered medications for this visit.    VITAL SIGNS: There were no vitals taken for this visit. There were no vitals filed for this visit.  Estimated body mass index is 24.97 kg/m as calculated from the following:   Height as of 07/13/22: 5\' 8"  (1.727 m).   Weight as of an earlier encounter on 07/23/22: 164 lb 3.2 oz (74.5 kg).     Latest Ref Rng & Units 07/23/2022   10:46 AM 07/13/2022    4:16 PM 06/24/2022    1:21 AM  CBC  WBC 4.0 - 10.5 K/uL 8.2  13.5  7.0   Hemoglobin 13.0 - 17.0 g/dL 8.6  9.5  8.6   Hematocrit 39.0 - 52.0 % 28.5  31.2  27.5   Platelets 150 - 400 K/uL 408  461  374        Latest Ref Rng & Units 07/23/2022   10:46 AM 07/13/2022    4:16 PM 06/28/2022    3:37 AM  CMP  Glucose 70 - 99 mg/dL 960  454  098   BUN 8 - 23 mg/dL 10  15  16    Creatinine 0.61 - 1.24 mg/dL 1.19  1.47  8.29   Sodium 135 - 145 mmol/L 141  133  135   Potassium 3.5 - 5.1 mmol/L 3.1  3.9  4.0   Chloride 98 - 111 mmol/L 99  95  102   CO2 22 - 32 mmol/L 33  28  24   Calcium 8.9 - 10.3 mg/dL 9.1  8.3  8.2   Total Protein 6.5 - 8.1 g/dL 7.3  7.4    Total Bilirubin 0.3 - 1.2 mg/dL 0.4  0.6    Alkaline Phos 38 - 126 U/L 77  74    AST 15 - 41 U/L 12  20    ALT 0 - 44 U/L 18  28       PERFORMANCE STATUS (ECOG) : 1 - Symptomatic but completely ambulatory   IMPRESSION: [name] presents to the clinic for follow-up. Continues to endorse ongoing weakness and fatigue. His daughter is present. Denies nausea, vomiting, constipation, or diarrhea. Is tired sharing he has been here to long and is ready to get home.   Patient denies pain. Continues to struggle with insomnia. He is taking olanzapine at bedtime. Valium as needed for anxiety. Daughter reports resolution of agitation and confusion.   Patient seen by Dr. Arbutus Ped today. Patient and daughter verbalizes plans to pursue further treatment with expected  changes. We will continue to maintain close follow-up and support.   PLAN:  Continue with Oxycodone  5/325 mg every 6 hours as needed for moderate to severe pain. Not requiring often Denzapine 5 mg at bedtime Valium 2 mg as needed for anxiety Ongoing symptom management and goals of care support. Palliative will plan to see patient back in 3-4 weeks in collaboration to other oncology appointments.  Patient knows to contact our office if needed sooner.  Patient expressed understanding and was in agreement with this plan. He also understands that He can call the clinic at any time with any questions, concerns, or complaints.   Any controlled substances utilized were prescribed in the context of palliative care. PDMP has been reviewed.   Visit consisted of counseling and education dealing with the complex and emotionally intense issues of symptom management and palliative care in the setting of serious and potentially life-threatening illness.Greater than 50%  of this time was spent counseling and coordinating care related to the above assessment and plan.   Willette Alma, AGPCNP-BC  Palliative Medicine Team/[facility]  *Please note that this is a verbal dictation therefore any spelling or grammatical errors are due to the "Dragon Medical One" system interpretation.

## 2022-07-23 NOTE — Progress Notes (Signed)
[facility] [phone]) [phone]   [phone]  OFFICE PROGRESS NOTE  [name], MD [address] [place] [address]  DIAGNOSIS: Stage IV clear-cell renal cell carcinoma diagnosed initially in 2016 as localized T1 a disease with evidence of metastatic disease to the liver and omentum in 2019.  PRIOR THERAPY: 1) status post radical right renal nephrectomy under the care of Dr. Marlou Porch on February 01, 2015 and the final pathology revealed 3.3 cm clear renal cell carcinoma with Fuhrman grade 3. 2) status post omental biopsy in July 2019 that confirmed the presence of recurrent metastatic renal cell carcinoma. 3) status post treatment with immunotherapy with ipilimumab 1 Mg/KG and nivolumab 3 mg/KG every 3 weeks started 09/21/2017 status post 4 cycles and the patient did not receive any additional immunotherapy secondary to adrenal insufficiency and panhypopituitarism 4) status post radiotherapy to the abdominal lymph node completed November 08, 2019. 5) Cabometyx initially started at 40 mg p.o. daily in March 2023 and reduced to 20 mg p.o. daily in April 2023.  This was discontinued in June 2024 secondary to disease progression.  CURRENT THERAPY: Axitinib 2 mg p.o. twice daily.  First dose is expected to start in the next few days.  INTERVAL HISTORY: [name] 77 y.o. male returns to the clinic today for follow-up visit accompanied by his daughter [name].  The patient continues to have the pain on the maxillary area.  He has been undergoing treatment by infectious disease for questionable fungal infection of the maxillary sinuses.  His recent imaging studies showed no significant improvement in the condition but no worsening either.  He was seen at the emergency department around 2 weeks ago with fatigue and weakness and feeling poorly.  He had poor p.o. intake and dehydration.  During his evaluation, he had CT angiogram of the chest that showed  no evidence for pulmonary embolism or disease progression in the chest but CT scan of the abdomen and pelvis on the same day showed increase in the size of the known pancreatic mass with increased size of peripancreatic and retroperitoneal nodes as well as a slight increase in the size of the dominant right hepatic lesion with multiple small hypodense liver lesions suspicious for metastasis.  He is here today for evaluation and recommendation regarding his condition.  His daughter mentioned that he has been taking his Cabometyx at regular basis and it was stopped only for 2 weeks.  He has no fever or chills.  He has no nausea, vomiting, diarrhea or constipation.  He has no headache or visual changes.    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  MEDICATIONS:  Current Outpatient Medications  Medication Sig Dispense Refill   acetaminophen (TYLENOL) 500 MG tablet  Take 1,000 mg by mouth 2 (two) times daily.     cabozantinib (CABOMETYX) 20 MG tablet Take 1 tablet (20 mg total) by mouth daily. Take on an empty stomach, 1 hour before or 2 hours after meals. 30 tablet 1   diazepam (VALIUM) 2 MG tablet Take 1 tablet (2 mg total) by mouth every 8 (eight) hours as needed for anxiety. 45 tablet 0   diltiazem (CARDIZEM CD) 240 MG 24 hr capsule Take 1 capsule (240 mg total) by mouth in the morning. 90 capsule 3   ELIQUIS  5 MG TABS tablet Take 1 tablet (5 mg total) by mouth 2 (two) times daily. 60 tablet 2   ezetimibe (ZETIA) 10 MG tablet Take 1 tablet (10 mg total) by mouth daily. (Patient taking differently: Take 10 mg by mouth 2 (two) times a week.) 90 tablet 3   fluticasone (FLONASE) 50 MCG/ACT nasal spray Place 2 sprays into both nostrils daily as needed for allergies.     hydrocortisone (CORTEF) 10 MG tablet Take 1 tablet (10 mg total) by mouth at bedtime. 90 tablet 0   hydrocortisone (CORTEF) 20 MG tablet Take 1 tablet (20 mg total) by mouth every morning. 90 tablet 0   levothyroxine (SYNTHROID) 150 MCG tablet Take 1 tablet (150 mcg total) by mouth every other day. 30 tablet 3   levothyroxine (SYNTHROID) 175 MCG tablet TAKE 1 TABLET BY MOUTH EVERY OTHER DAY 30 tablet 3   loratadine (CLARITIN) 10 MG tablet Take 10 mg by mouth daily as needed for allergies.     morphine (MS CONTIN) 15 MG 12 hr tablet Take 1 tablet (15 mg total) by mouth every 12 (twelve) hours. 30 tablet 0   Multiple Vitamins-Minerals (CENTRUM SILVER 50+MEN) TABS Take 1 tablet by mouth daily with breakfast.     mupirocin ointment (BACTROBAN) 2 % Place 1 Application into the nose daily as needed (sores).     OLANZapine (ZYPREXA) 5 MG tablet Take 1 tablet (5 mg total) by mouth at bedtime. 90 tablet 0   oxyCODONE-acetaminophen (PERCOCET/ROXICET) 5-325 MG tablet Take 1 tablet by mouth every 6 (six) hours as needed for severe pain. 60 tablet 0   posaconazole (NOXAFIL) 100 MG TBEC delayed-release tablet Take 3 tablets (300 mg total) by mouth daily with breakfast. 90 tablet 2   traZODone (DESYREL) 150 MG tablet Take 150 mg by mouth at bedtime as needed for sleep.     VITAMIN D PO Take 1 capsule by mouth daily.     No current facility-administered medications for this visit.    SURGICAL HISTORY:  Past Surgical History:  Procedure Laterality Date   APPENDECTOMY  age 61   ARTERY BIOPSY Left 04/10/2022   Procedure: LEFT TEMPORAL ARTERY BIOPSY;   Surgeon: Chuck Hint, MD;  Location: [facility] OR;  Service: Vascular;  Laterality: Left;   CHOLECYSTECTOMY  10/18/1978   EYE SURGERY Bilateral 2019   cataract   LAPAROSCOPIC LYSIS OF ADHESIONS N/A 08/01/2015   Procedure: LAPAROSCOPIC LYSIS OF ADHESIONS;  Surgeon: Karie Soda, MD;  Location: [facility] ORS;  Service: General;  Laterality: N/A;   LUNG LOBECTOMY  02/16/1990   lung cancer- patient has staples in lung not to have MRI per patient    MAXILLARY ANTROSTOMY Left 05/22/2022   Procedure: MAXILLARY ANTROSTOMY;  Surgeon: Laren Boom, DO;  Location: [facility] OR;  Service: ENT;  Laterality: Left;   PILONIDAL CYST EXCISION  02/16/2009   Dr Gerrit Friends   ROBOTIC ASSITED PARTIAL NEPHRECTOMY Right 02/01/2015  Procedure: RIGHT ROBOTIC ASSISTED LAPAROSOCOPY NEPHRECTOMY;  Surgeon: Crist Fat, MD;  Location: [facility] OR[facility];  Service: Urology;  Laterality: Right;   SINUS ENDO WITH FUSION Left 05/22/2022   Procedure: SINUS ENDO WITH FUSION;  Surgeon: Laren Boom, DO;  Location: [facility] OR;  Service: ENT;  Laterality: Left;   VENTRAL HERNIA REPAIR N/A 08/01/2015   Procedure: LAPAROSCOPIC VENTRAL WALL HERNIA WITH MESH;  Surgeon: Karie Soda, MD;  Location: [facility];  Service: General;  Laterality: N/A;    REVIEW OF SYSTEMS:  Constitutional: positive for anorexia, fatigue, and weight loss Eyes: negative Ears, nose, mouth, throat, and face: negative Respiratory: negative Cardiovascular: negative Gastrointestinal: negative Genitourinary:negative Integument/breast: negative Hematologic/lymphatic: negative Musculoskeletal:positive for muscle weakness Neurological: negative Behavioral/Psych: positive for fatigue and sleep disturbance Endocrine: negative Allergic/Immunologic: negative   PHYSICAL EXAMINATION: General appearance: alert, cooperative, fatigued, and no distress Head: Normocephalic, without obvious abnormality, atraumatic Neck: no adenopathy, no JVD, supple, symmetrical, trachea midline, and  thyroid not enlarged, symmetric, no tenderness/mass/nodules Lymph nodes: Cervical, supraclavicular, and axillary nodes normal. Resp: clear to auscultation bilaterally Back: symmetric, no curvature. ROM normal. No CVA tenderness. Cardio: regular rate and rhythm, S1, S2 normal, no murmur, click, rub or gallop GI: soft, non-tender; bowel sounds normal; no masses,  no organomegaly Extremities: extremities normal, atraumatic, no cyanosis or edema Neurologic: Alert and oriented X 3, normal strength and tone. Normal symmetric reflexes. Normal coordination and gait  ECOG PERFORMANCE STATUS: 1 - Symptomatic but completely ambulatory  Blood pressure (!) 156/73, pulse 80, temperature 97.9 F (36.6 C), temperature source Oral, resp. rate 18, weight 164 lb 3.2 oz (74.5 kg), SpO2 94 %.  LABORATORY DATA: Lab Results  Component Value Date   WBC 8.2 07/23/2022   HGB 8.6 (L) 07/23/2022   HCT 28.5 (L) 07/23/2022   MCV 83.6 07/23/2022   PLT 408 (H) 07/23/2022      Chemistry      Component Value Date/Time   NA 133 (L) 07/13/2022 1616   NA 141 08/11/2021 1252   K 3.9 07/13/2022 1616   CL 95 (L) 07/13/2022 1616   CO2 28 07/13/2022 1616   BUN 15 07/13/2022 1616   BUN 14 08/11/2021 1252   CREATININE 0.95 07/13/2022 1616   CREATININE 0.94 06/23/2022 1206      Component Value Date/Time   CALCIUM 8.3 (L) 07/13/2022 1616   ALKPHOS 74 07/13/2022 1616   AST 20 07/13/2022 1616   AST 26 06/23/2022 1206   ALT 28 07/13/2022 1616   ALT 36 06/23/2022 1206   BILITOT 0.6 07/13/2022 1616   BILITOT 0.2 (L) 06/23/2022 1206       RADIOGRAPHIC STUDIES: CT Maxillofacial W Contrast  Result Date: 07/13/2022 CLINICAL DATA:  Altered mental status, possible dehydration, history of pancreatic, lung, and kidney cancer, maxillary sinus mass EXAM: CT HEAD WITHOUT CONTRAST CT MAXILLOFACIAL WITHOUT CONTRAST TECHNIQUE: Multidetector CT imaging of the head and maxillofacial structures were performed using the standard  protocol without intravenous contrast. Multiplanar CT image reconstructions of the maxillofacial structures were also generated. RADIATION DOSE REDUCTION: This exam was performed according to the departmental dose-optimization program which includes automated exposure control, adjustment of the mA and/or kV according to patient size and/or use of iterative reconstruction technique. COMPARISON:  None Available. FINDINGS: CT HEAD FINDINGS Brain: No evidence of acute infarction, hemorrhage, hydrocephalus, extra-axial collection or mass lesion/mass effect. Vascular: No hyperdense vessel or unexpected calcification. CT FACIAL BONES FINDINGS Skull: Normal. Negative for fracture or focal lesion. Facial bones: No displaced fractures  or dislocations. Sinuses/Orbits: Similar appearance of erosion and destruction of the posterior wall of the left maxillary sinus with severe sinus wall thickening and air loculations and soft tissue in the left masticator space (series 5, image 13). Status post left maxillary antrostomy. Other: None. IMPRESSION: 1. No acute intracranial pathology. 2. No displaced fractures or dislocations of the facial bones. 3. Similar appearance of erosion and destruction of the posterior wall of the left maxillary sinus with severe sinus wall thickening and air loculations and soft tissue in the left masticator space. Differential considerations remain as previously described on examination dated 05/19/2022 and include chronic, aggressive infection with associated soft tissue abscess component as well as metastatic from patient's known primary malignancies. Electronically Signed   By: Jearld Lesch M.D.   On: 07/13/2022 18:53   CT Head Wo Contrast  Result Date: 07/13/2022 CLINICAL DATA:  Altered mental status, possible dehydration, history of pancreatic, lung, and kidney cancer, maxillary sinus mass EXAM: CT HEAD WITHOUT CONTRAST CT MAXILLOFACIAL WITHOUT CONTRAST TECHNIQUE: Multidetector CT imaging of the  head and maxillofacial structures were performed using the standard protocol without intravenous contrast. Multiplanar CT image reconstructions of the maxillofacial structures were also generated. RADIATION DOSE REDUCTION: This exam was performed according to the departmental dose-optimization program which includes automated exposure control, adjustment of the mA and/or kV according to patient size and/or use of iterative reconstruction technique. COMPARISON:  None Available. FINDINGS: CT HEAD FINDINGS Brain: No evidence of acute infarction, hemorrhage, hydrocephalus, extra-axial collection or mass lesion/mass effect. Vascular: No hyperdense vessel or unexpected calcification. CT FACIAL BONES FINDINGS Skull: Normal. Negative for fracture or focal lesion. Facial bones: No displaced fractures or dislocations. Sinuses/Orbits: Similar appearance of erosion and destruction of the posterior wall of the left maxillary sinus with severe sinus wall thickening and air loculations and soft tissue in the left masticator space (series 5, image 13). Status post left maxillary antrostomy. Other: None. IMPRESSION: 1. No acute intracranial pathology. 2. No displaced fractures or dislocations of the facial bones. 3. Similar appearance of erosion and destruction of the posterior wall of the left maxillary sinus with severe sinus wall thickening and air loculations and soft tissue in the left masticator space. Differential considerations remain as previously described on examination dated 05/19/2022 and include chronic, aggressive infection with associated soft tissue abscess component as well as metastatic from patient's known primary malignancies. Electronically Signed   By: Jearld Lesch M.D.   On: 07/13/2022 18:53   CT ABDOMEN PELVIS W CONTRAST  Result Date: 07/13/2022 CLINICAL DATA:  Abdominal pain, acute, nonlocalized EXAM: CT ABDOMEN AND PELVIS WITH CONTRAST TECHNIQUE: Multidetector CT imaging of the abdomen and pelvis was  performed using the standard protocol following bolus administration of intravenous contrast. RADIATION DOSE REDUCTION: This exam was performed according to the departmental dose-optimization program which includes automated exposure control, adjustment of the mA and/or kV according to patient size and/or use of iterative reconstruction technique. CONTRAST:  OMNIPAQUE IOHEXOL 350 MG/ML SOLN COMPARISON:  Radiograph earlier today. CT 05/03/2022 FINDINGS: Lower chest: Assessed on concurrent chest CTA, reported separately. Hepatobiliary: 4.2 cm hypodense lesion in the left hepatic lobe series 8, image 40, grossly unchanged from prior. There additional subtle hypodense lesions in the right hepatic lobe series 8, image 16, 17, and 35. Lack of contrast on prior exam makes comparison difficult to assess for acuity. Cholecystectomy. Common bile duct is normal in caliber. Pancreas: Heterogeneous mass in the uncinate process of the pancreas has increased in size from prior  exam, representative measurement 6.9 x 5.6 cm, series 8, image 41. Pancreatic mass abuts the main portal vein. Adjacent pancreatic nodule measuring 19 mm, series 8, image 32, increased from 8 mm. Peripancreatic node anteriorly measures 12 mm series 8, image 46, unchanged. Mild pancreatic ductal dilatation at 5 mm. Faint stranding about the pancreatic tail is similar, unchanged. Spleen: Normal in size without focal abnormality. Adrenals/Urinary Tract: Normal adrenal glands. Prior right nephrectomy with stable appearance of fat necrosis in the nephrectomy bed. There is no left hydronephrosis. Tiny subcentimeter hypodensities in the left kidney are too small to characterize, no specific imaging follow-up is needed. There is excreted IV contrast in the urinary bladder which is only minimally distended. Stomach/Bowel: Colonic diverticulosis which is prominent in the sigmoid. No diverticulitis. The uncinate process mass is intimately related to the second  and third portion of the duodenum as before. No evidence of gastric, duodenal, or small bowel obstruction. Appendix is not seen. Vascular/Lymphatic: Advanced aortic atherosclerosis. Left periaortic nodes are increasing, for example 11 mm, series 8, image 51, previously 9 mm. Peripancreatic lymph nodes as described above. Reproductive: Prostate is unremarkable. Other: Trace free fluid in the pelvis. No free intra-abdominal air. Musculoskeletal: There are no acute or suspicious osseous abnormalities. IMPRESSION: 1. Known pancreatic mass is increased in size from prior exam. Increased size of peripancreatic and retroperitoneal nodes. 2. Slight increased size of the dominant right hepatic lesion. There are multiple small hypodense liver lesions suspicious for metastasis, unclear if these are new from prior exam is previous or not obtained with contrast. 3. Colonic diverticulosis without diverticulitis. 4. Prior right nephrectomy with stable appearance of fat necrosis in the nephrectomy bed. Aortic Atherosclerosis (ICD10-I70.0). Electronically Signed   By: Narda Rutherford M.D.   On: 07/13/2022 18:46   CT Angio Chest PE W and/or Wo Contrast  Result Date: 07/13/2022 CLINICAL DATA:  Pulmonary embolism (PE) suspected, high prob EXAM: CT ANGIOGRAPHY CHEST WITH CONTRAST TECHNIQUE: Multidetector CT imaging of the chest was performed using the standard protocol during bolus administration of intravenous contrast. Multiplanar CT image reconstructions and MIPs were obtained to evaluate the vascular anatomy. Performed in conjunction with CT of the abdomen and pelvis, reported separately. RADIATION DOSE REDUCTION: This exam was performed according to the departmental dose-optimization program which includes automated exposure control, adjustment of the mA and/or kV according to patient size and/or use of iterative reconstruction technique. CONTRAST:  OMNIPAQUE IOHEXOL 350 MG/ML SOLN COMPARISON:  Radiograph earlier today.  Chest CT 05/03/2022 FINDINGS: Cardiovascular: There are no filling defects within the pulmonary arteries to suggest pulmonary embolus. Atherosclerosis of the thoracic aorta. No acute aortic findings. Upper normal heart size. Coronary artery calcifications. No significant pericardial effusion. Mediastinum/Nodes: No mediastinal or hilar adenopathy. No thyroid nodule. Decompressed esophagus. Lungs/Pleura: Prior right lower lobectomy. Mild scarring in the right hemithorax related to prior surgery. There is bronchial thickening without focal airspace disease. Mild mosaic attenuation of pulmonary parenchyma. 4 mm sub solid nodule in the left lower lobe series 12, image 67 is unchanged prior exam. Recommend attention at oncologic follow-up. Intra no new pulmonary nodules. No pleural effusion. Upper Abdomen: Assessed on concurrent abdominal CT, reported separately. Musculoskeletal: There are no acute or suspicious osseous abnormalities. Schmorl's nodes throughout the spine. No chest wall soft tissue abnormalities. Review of the MIP images confirms the above findings. IMPRESSION: 1. No pulmonary embolus. 2. Bronchial thickening with mild mosaic attenuation of pulmonary parenchyma, can be seen with small airways disease. 3. Prior right lower lobectomy. Aortic  Atherosclerosis (ICD10-I70.0). Electronically Signed   By: Narda Rutherford M.D.   On: 07/13/2022 18:34   DG Abd Portable 2 Views  Result Date: 07/13/2022 CLINICAL DATA:  Abdominal pain. Dehydration. Pancreatic, lung, and renal carcinoma. EXAM: PORTABLE ABDOMEN - 2 VIEW COMPARISON:  KUB 03/14/2018, CT abdomen and pelvis 05/03/2022 FINDINGS: Air is seen throughout the colon common continuity descending colon and sigmoid colon distally. Overall paucity of small bowel gas. No dilated loops of bowel are seen. No free air is seen on right-sided up decubitus view. No portal venous gas or pneumatosis. Moderate L5-S1 and mild L4-5 degenerative disc and endplate changes.  Cholecystectomy clips. Unchanged sclerotic focus within the right proximal femoral intertrochanteric region compared to 12/26/2019 CT, likely a bone island. Vascular phleboliths overlie the pelvis. IMPRESSION: Overall paucity of small bowel gas. No dilated loops of bowel to indicate bowel obstruction. Electronically Signed   By: Neita Garnet M.D.   On: 07/13/2022 17:05   DG Chest 2 View  Result Date: 07/13/2022 CLINICAL DATA:  Pancreatic, lung, and renal cancer. Altered mental status. Being treated for sinus infection. EXAM: CHEST - 2 VIEW COMPARISON:  Chest radiographs 06/24/2022, 07/09/2021, 03/25/2021 FINDINGS: Cardiac silhouette and mediastinal contours are within limits. Surgical clips overlie the right hilum status post right lower lung partial resection. There is chronic blunting of the right costophrenic angle, postsurgical scarring. The lungs are clear. No pleural effusion or pneumothorax. Mild multilevel degenerative disc changes of the thoracic spine. IMPRESSION: 1. No active cardiopulmonary disease. 2. Postsurgical changes of the right lung. Electronically Signed   By: Neita Garnet M.D.   On: 07/13/2022 17:00   DG Chest Port 1 View  Result Date: 06/24/2022 CLINICAL DATA:  Acute hyponatremia. EXAM: PORTABLE CHEST 1 VIEW COMPARISON:  CT chest 05/03/2022 FINDINGS: Stable cardiomediastinal contours. Status post right middle and right lower lobectomies. Asymmetric right lung volume loss is noted with elevation of the right hemidiaphragm. Surgical clips within the mediastinum and right infrahilar region are again seen. No superimposed pleural effusion, airspace consolidation or interstitial edema. IMPRESSION: 1. No acute cardiopulmonary abnormalities. 2. Status post right middle and right lower lobectomies. Electronically Signed   By: Signa Kell M.D.   On: 06/24/2022 11:26    ASSESSMENT AND PLAN: This is a very pleasant 77 years old white male with Stage IV clear-cell renal cell carcinoma  diagnosed initially in 2016 as localized T1 a disease with evidence of metastatic disease to the liver and omentum in 2019. The patient underwent the following treatment: 1) status post radical right renal nephrectomy under the care of Dr. Marlou Porch on February 01, 2015 and the final pathology revealed 3.3 cm clear renal cell carcinoma with Fuhrman grade 3. 2) status post omental biopsy in July 2019 that confirmed the presence of recurrent metastatic renal cell carcinoma. 3) status post treatment with immunotherapy with ipilimumab 1 Mg/KG and nivolumab 3 mg/KG every 3 weeks started 09/21/2017 status post 4 cycles and the patient did not receive any additional immunotherapy secondary to adrenal insufficiency and panhypopituitarism 4) status post radiotherapy to the abdominal lymph node completed November 08, 2019. He was on treatment with Cabometyx 20 mg p.o. daily started March 2023.  His treatment has been on hold for the last 2 months because of the recent sinus issues and his final diagnosis was fungal sinusitis and currently on treatment with voriconazole by infectious disease.  He was supposed to be holding his treatment with Cabometyx but his daughter mentioned that the patient has been taking  it regularly and held it only for like 2 weeks. The patient had repeat CT scan of the chest, abdomen and pelvis performed recently.  I personally and independently reviewed the scan and discussed the result with the patient and his daughter. Unfortunately his scan showed evidence for disease progression in the abdomen including the pancreatic mass as well as peripancreatic and retroperitoneal lymphadenopathy as well as liver lesions. I recommended for the patient to discontinue his current treatment with Cabometyx at this point. I discussed with him treatment with axitinib initially was considering him for 5 mg p.o. twice daily but because of drug interaction with posaconazole, we will reduce his dose of treatment  to 2 mg p.o. twice daily for now and may consider increase it in the future once he is done with the antifungal treatment.  He was seen by the pharmacist for oral oncolytic for education and also to help the patient with the refill of his medication. I will see him back for follow-up visit in 2 weeks for evaluation and repeat blood work. For the anemia, I will continue on the oral iron tablet with vitamin C. For the maxillary pain, he is followed by the palliative care team for pain management. The patient was advised to call immediately if he has any other concerning symptoms in the interval. The patient voices understanding of current disease status and treatment options and is in agreement with the current care plan.  All questions were answered. The patient knows to call the clinic with any problems, questions or concerns. We can certainly see the patient much sooner if necessary. The total time spent in the appointment was 30 minutes.  Disclaimer: This note was dictated with voice recognition software. Similar sounding words can inadvertently be transcribed and may not be corrected upon review.

## 2022-07-23 NOTE — Telephone Encounter (Signed)
Oral Oncology Patient Advocate Encounter  Prior Authorization for [facility] has been approved.    PA# [id number] Effective dates: 07/23/22 through 02/16/23  Patients co-pay is $0.    Jinger Neighbors, CPhT-Adv Oncology Pharmacy Patient Advocate [facility] Direct Number: [phone]  Fax: [phone]

## 2022-07-23 NOTE — Telephone Encounter (Signed)
Oral Oncology Patient Advocate Encounter   Received notification that prior authorization for Inlyta is required.   PA submitted on 07/23/22 Key [id number] Status is pending     Jinger Neighbors, CPhT-Adv Oncology Pharmacy Patient Advocate [facility] Direct Number: [phone]  Fax: [phone]

## 2022-07-23 NOTE — Telephone Encounter (Signed)
-----   Message from Si Gaul, MD sent at 07/23/2022 12:46 PM EDT ----- I will send K-Dur to his pharmacy.  Please let him know. ----- Message ----- From: Leory Plowman, Lab [facility] [place] Sent: 07/23/2022  11:01 AM EDT To: Si Gaul, MD

## 2022-07-23 NOTE — Telephone Encounter (Addendum)
Oral Chemotherapy Pharmacist Encounter  I met with patient and patient's daughter, [name], in clinic for overview of: Inlyta (axitinib) for the treatment of previously treated, metastatic clear-cell renal cell carcinoma, planned duration until disease progression or unacceptable toxicity.   Counseled patient on administration, dosing, side effects, monitoring, drug-food interactions, safe handling, storage, and disposal.  CBC w/ Diff and CMP from 07/23/22 assessed, no renal or hepatic dose adjustments required. Patient with K of 3.1 - Rx sent by MD for potassium replacement to patient's pharmacy. Prescription dose and frequency assessed for appropriateness. Will reduce patient's Inlyta dose 50% to account for posaconazole drug-drug interaction. Prescription dose and frequency assessed for appropriateness.  Patient will take Inlyta 1mg  tablets, 2 tablets (2 mg total) by mouth twice daily, without regard to food, with a glass of water.  Of note - if patient tolerates 2 mg BID for first cycle of Inlyta, he may be dose increased to 3 mg BID thereafter while he remains on posaconazole. Once patient discontinues posaconazole therapy, he should be assessed for ability to increase back to normal dosing of 5 mg BID at that time.   Patient instructed to avoid grapefruit and grapefruit juice while on therapy with Inlyta.  Inlyta start date: 07/28/22  Adverse effects include but are not limited to: hypertension, hand-foot syndrome, nausea, vomiting, diarrhea, fatigue, dysphonia (hoarseness), and abnormal laboratory values.   Hand-foot syndrome: discussed use of cream such as [place] Smooth Extra Care 20 or equivalent advanced care cream that has 20% urea content for advanced skin hydration while on Inlyta Diarrhea: Patient will obtain Imodium (loperamide) to have on hand if they experience diarrhea. Patient knows to alert the office of 4 or more loose stools above baseline.  Inlyta will be held at least 24  hours prior to surgery and resumed at discretion of treating physician based on wound healing  Reviewed with patient importance of keeping a medication schedule and plan for any missed doses. No barriers to medication adherence identified.  Medication reconciliation performed and medication/allergy list updated. Current medication list in Epic reviewed, DDIs with Inlyta identified: Category D drug-drug interaction between Inlyta and Posaconazole - Posaconazole, a strong CYP3A4 inhibitor, may increase serum concentrations of Inlyta. It is recommended to reduce dose of Inlyta 50% to account for using both agent concomitantly. Patient will be initiated on 2 mg BID at this time for 1 cycle, and then if tolerated, may be increased to 3 mg BID thereafter while he remains on posaconazole. Category C drug-drug interaction between Inlyta and Diltiazem - Diltiazem, a moderate CYP3A4 inhibitor, may increase serum concentrations of Inlyta. Recommend monitoring patient for increased ADEs from Inlyta. Of note, patient starting on dose reduction due to posaconazole DDI.  All questions answered.  Patient agreement for treatment documented in MD note on 07/23/2022.  [name] and daughter [name] voiced understanding and appreciation.   Medication education handout given to patient. Patient knows to call the office with questions or concerns. Oral Chemotherapy Clinic phone number provided to patient.   Lenord Carbo, PharmD, BCPS, [facility] Hematology/Oncology Clinical Pharmacist [facility] Oral Chemotherapy Navigation Clinics [phone] 07/23/2022 12:03 PM

## 2022-07-23 NOTE — Telephone Encounter (Signed)
Dtr notified

## 2022-07-24 ENCOUNTER — Telehealth: Payer: Self-pay | Admitting: Internal Medicine

## 2022-07-24 ENCOUNTER — Other Ambulatory Visit (HOSPITAL_COMMUNITY): Payer: Self-pay

## 2022-07-24 ENCOUNTER — Other Ambulatory Visit: Payer: Self-pay

## 2022-07-24 NOTE — Telephone Encounter (Signed)
Scheduled per 06/06 los, patient has been called and notified.

## 2022-07-27 ENCOUNTER — Other Ambulatory Visit (HOSPITAL_COMMUNITY): Payer: Self-pay

## 2022-07-27 ENCOUNTER — Other Ambulatory Visit: Payer: Self-pay

## 2022-07-30 ENCOUNTER — Telehealth: Payer: Self-pay | Admitting: *Deleted

## 2022-07-30 NOTE — Progress Notes (Signed)
       Care Coordination    Collaboration with [facility] MSW LCSW Manager via email. Patient is working with Palliative program, and was saw 07/25/22 and is scheduled to follow up 6/25. [facility] SW and palliative Care Team will have a case discussion and ask social work reach out to family.   [facility]  Care Coordination Care Guide  Direct Dial: [phone]

## 2022-07-31 ENCOUNTER — Encounter: Payer: Self-pay | Admitting: Otolaryngology

## 2022-08-03 ENCOUNTER — Ambulatory Visit: Payer: Medicare HMO | Admitting: Internal Medicine

## 2022-08-03 ENCOUNTER — Encounter: Payer: Self-pay | Admitting: Internal Medicine

## 2022-08-03 VITALS — BP 150/80 | HR 84 | Ht 68.0 in | Wt 162.6 lb

## 2022-08-03 DIAGNOSIS — E2749 Other adrenocortical insufficiency: Secondary | ICD-10-CM | POA: Diagnosis not present

## 2022-08-03 DIAGNOSIS — E039 Hypothyroidism, unspecified: Secondary | ICD-10-CM

## 2022-08-03 MED ORDER — LEVOTHYROXINE SODIUM 150 MCG PO TABS: 150.0000 ug | ORAL_TABLET | Freq: Every day | ORAL | 3 refills | Status: DC

## 2022-08-03 MED ORDER — HYDROCORTISONE 5 MG PO TABS: ORAL_TABLET | ORAL | 3 refills | Status: DC

## 2022-08-03 NOTE — Patient Instructions (Addendum)
Please decrease the Hydrocortisone to: - 20 mg in am and 5 mg in the pm (around 2 pm) for 3 weeks, then decrease to: - 15 mg in am and 5 mg in the pm  - You absolutely need to take this medication every day and not skip doses. - Please double the dose if you have a fever, for the duration of the fever. - If you cannot take anything by mouth (vomiting) or you have severe diarrhea so that you eliminate the hydrocortisone pills in your stool, please make sure that you get the hydrocortisone in the vein instead - go to the nearest emergency department/urgent care or you may go to your PCPs office  - Please get a MedAlert bracelet or pendant indicating: "Adrenal insufficiency".  Please decrease: - Levothyroxine 150 mcg every day.  Take the thyroid hormone every day, with water, at least 30 minutes before breakfast, separated by at least 4 hours from: - acid reflux medications - calcium - iron - multivitamins  Please come back for a follow-up appointment in 1 year, but for labs in 5 weeks (no later than 06/22).

## 2022-08-03 NOTE — Progress Notes (Signed)
Patient ID: [name], male   DOB: [birth year], 77 y.o.   MRN: [id number]   HPI  [name] is a 77 y.o.-year-old male, returning for follow-up for hypothyroidism and adrenal insufficiency.  Last OV 1 year ago.  He is here with his daughter who offers part of the history especially regarding his symptoms and medication doses.  Interim history: This visit,  he mentions he feels quite tired. He did lose approximately 14 lbs since last visit and previously lost 8 to 10 pounds since the prior visit.  No nausea/vomiting/diarrhea.  Before the visit, he started back on Biologic therapy (cabozantinib) >> Axitinib. He did not have to double the dose of hydrocortisone since last visit or obtain it parenterally.  However, he mentions that his dose of prednisone was increased last visit by another physician.  Reviewed history: Patient was admitted for weakness on 03/13/2018 with mild hyponatremia of 129.  However, he was recently admitted on 03/24/2018 with severe hyponatremia, at 115.  At that time, he was found to be profoundly hypothyroid and also adrenal insufficiency.  He was started on hormonal replacement for both conditions and his sodium was slowly repeated.  His sodium improved to normal before discharge.  He was sent home with levothyroxine and hydrocortisone.  He was advised to see endocrinology in 1 month.  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 He had recurrence in 09/2017 >> metastases in Lung, Liver, and L kidney. He is on ipilimumab Arthur Holms) and Nivolumab (Opdivo) - started 09/2017, continued q3 weeks x 4 >> stopped after developing weakness in ~12/2017.  Reviewing his oncologist note, he will continue with p.o. Biologics if needed after he has a new CT scan.  He finished RxTx in 10/2019.  Primary Hypothyroidism: He was diagnosed with hypothyroidism in 03/2018 and started on levothyroxine. We subsequently increased the dose  up to 175 mcg daily, last dose change 01/2020.  In 05/2020, his TSH was suppressed so we decreased the dose of levothyroxine.  He takes levothyroxine 175 alternating with 150 mcg every other day: - no missed doses - in am - fasting - at least 30 min from b'fast - no Ca - + MVI at night - + Fe -added since last visit but he is not sure whether he is taking his in am or at night - stopped PPI - + Pepcid later in the day - not on Biotin  Reviewed his TFTs: Lab Results  Component Value Date   TSH 0.182 (L) 07/13/2022   TSH 1.830 06/24/2022   TSH 1.15 06/10/2022   TSH 1.51 07/23/2021   TSH 1.651 03/25/2021   TSH 3.06 01/20/2021   TSH 0.27 (L) 06/05/2020   TSH 2.48 03/06/2020   TSH 6.91 (H) 01/24/2020   TSH 7.45 (H) 12/04/2019   FREET4 1.21 06/10/2022   FREET4 1.30 07/23/2021   FREET4 1.33 01/20/2021   FREET4 1.24 06/05/2020   FREET4 1.07 03/06/2020   FREET4 1.08 01/24/2020   FREET4 0.89 12/04/2019   FREET4 1.18 06/01/2019   FREET4 1.04 04/20/2019   FREET4 1.01 12/06/2018   T3FREE 0.5 (L) 03/24/2018   Received labs from PCP, on 10/19/2018: CBC with low hemoglobin, at 12.5 CMP with high glucose at 178, BUN/creatinine 18/1.78, GFR 37, otherwise normal TSH 2.64, free T4 1.35  Antithyroid antibodies: No results found for: "THGAB" No components found for: "TPOAB"  Pt denies: - feeling nodules in neck - hoarseness -  dysphagia - choking  No FH of thyroid disease or cancer. No h/o radiation tx to head or neck. No herbal supplements. No Biotin use. No recent steroids use.   Adrenal insufficiency:  He was found to have a low cortisol, of 2.4 on 03/24/2018.    A cosyntropin stimulation test was abnormal the next day: Component     Latest Ref Rng & Units 03/24/2018 03/25/2018  Cortisol, Base     ug/dL  2.2  Cortisol, 30 Min     ug/dL  6.9  Cortisol, 60 Min     ug/dL  8.6  Cortisol, Plasma     ug/dL 2.4    He had significant fatigue, weakness, before starting  hydrocortisone.  He was started on a higher dose, 20 mg a.m. and 10 mg in a.m. and he felt much better immediately afterwards.  We were able to subsequently decrease the dose to 10 mg in a.m. and 5 mg in p.m. however, he did misunderstood instructions and was taking only 5 mg twice a day.  He did develop fatigue on this dose so we increased the dose back to 10 mg in a.m. and 5 mg in p.m. However, at today's visit he is taking 20 mg in the morning and 10 mg at dinnertime.  No nausea, weakness, dizziness, headaches. He has fatigue and decreased appetite, improved after finishing RxTx.  Hyponatremia: -Developed in 02/2018 -Most likely secondary to uncontrolled central adrenal insufficiency and hypothyroidism -Resolved  Reviewed his sodium levels: Lab Results  Component Value Date   NA 141 07/23/2022   NA 133 (L) 07/13/2022   NA 135 06/28/2022   NA 135 06/27/2022   NA 125 (L) 06/26/2022   NA 123 (L) 06/25/2022   NA 123 (L) 06/24/2022   NA 126 (L) 06/24/2022   NA 124 (L) 06/24/2022   NA 124 (L) 06/23/2022   NA 141 06/11/2022   NA 138 06/08/2022   NA 135 05/22/2022   NA 138 04/20/2022   NA 137 04/08/2022   NA 140 02/06/2022   NA 138 12/09/2021   NA 140 09/26/2021   NA 140 08/28/2021   NA 141 08/11/2021   NA 136 07/09/2021   NA 140 06/11/2021   NA 136 06/03/2021   NA 140 04/22/2021   NA 141 03/28/2021   NA 141 03/27/2021   NA 137 03/26/2021   NA 138 03/25/2021   NA 142 10/16/2020   NA 141 04/09/2020   He had lung CA in 1993-1994.  He quit smoking in 1990  ROS: + SEE hpi  I reviewed pt's medications, allergies, PMH, social hx, family hx, and changes were documented in the history of present illness. Otherwise, unchanged from my initial visit note.  Past Medical History:  Diagnosis Date   Arthritis    Chronic kidney disease    only has one kidney    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 02/01/2015   Coronary artery disease    followed by  Dr.Tilley   Frequent PVCs    GERD (gastroesophageal reflux disease)    Heart murmur    never has caused any problems   Hx of cancer of lung 1980's   Hyperlipidemia    Hypertension    Hypothyroidism    Incisional hernia 08/01/2015   Lung cancer (HCC) 1993   Lung metastases 2019   Pneumonia    x several   Recurrent umbilical hernia 08/01/2015   Past Surgical History:  Procedure Laterality Date  APPENDECTOMY  age 12   ARTERY BIOPSY Left 04/10/2022   Procedure: LEFT TEMPORAL ARTERY BIOPSY;  Surgeon: Chuck Hint, MD;  Location: [facility] OR;  Service: Vascular;  Laterality: Left;   CHOLECYSTECTOMY  10/18/1978   EYE SURGERY Bilateral 2019   cataract   LAPAROSCOPIC LYSIS OF ADHESIONS N/A 08/01/2015   Procedure: LAPAROSCOPIC LYSIS OF ADHESIONS;  Surgeon: Karie Soda, MD;  Location: [facility] ORS;  Service: General;  Laterality: N/A;   LUNG LOBECTOMY  02/16/1990   lung cancer- patient has staples in lung not to have MRI per patient    MAXILLARY ANTROSTOMY Left 05/22/2022   Procedure: MAXILLARY ANTROSTOMY;  Surgeon: Laren Boom, DO;  Location: [facility] OR;  Service: ENT;  Laterality: Left;   PILONIDAL CYST EXCISION  02/16/2009   Dr Gerrit Friends   ROBOTIC ASSITED PARTIAL NEPHRECTOMY Right 02/01/2015   Procedure: RIGHT ROBOTIC ASSISTED LAPAROSOCOPY NEPHRECTOMY;  Surgeon: Crist Fat, MD;  Location: [facility] ORS;  Service: Urology;  Laterality: Right;   SINUS ENDO WITH FUSION Left 05/22/2022   Procedure: SINUS ENDO WITH FUSION;  Surgeon: Laren Boom, DO;  Location: [facility] OR;  Service: ENT;  Laterality: Left;   VENTRAL HERNIA REPAIR N/A 08/01/2015   Procedure: LAPAROSCOPIC VENTRAL WALL HERNIA WITH MESH;  Surgeon: Karie Soda, MD;  Location: [facility] ORS;  Service: General;  Laterality: N/A;   Social History   Socioeconomic History   Marital status: Widowed    Spouse name: Not on file   Number of children: 2   Years of education: Not on file   Highest education level: Not on file  Occupational  History   Occupation: Retired  Tobacco Use   Smoking status: Former    Packs/day: 1.00    Years: 20.00    Additional pack years: 0.00    Total pack years: 20.00    Types: Cigarettes    Quit date: 01/29/1988    Years since quitting: 34.5   Smokeless tobacco: Never  Vaping Use   Vaping Use: Never used  Substance and Sexual Activity   Alcohol use: Not Currently    Comment: rare   Drug use: No   Sexual activity: Not Currently  Other Topics Concern   Not on file  Social History Narrative   Not on file   Social Determinants of Health   Financial Resource Strain: Not on file  Food Insecurity: No Food Insecurity (06/23/2022)   Hunger Vital Sign    Worried About Running Out of Food in the Last Year: Never true    Ran Out of Food in the Last Year: Never true  Transportation Needs: No Transportation Needs (06/23/2022)   PRAPARE - Administrator, Civil Service (Medical): No    Lack of Transportation (Non-Medical): No  Physical Activity: Not on file  Stress: Not on file  Social Connections: Not on file  Intimate Partner Violence: Not At Risk (06/23/2022)   Humiliation, Afraid, Rape, and Kick questionnaire    Fear of Current or Ex-Partner: No    Emotionally Abused: No    Physically Abused: No    Sexually Abused: No   Current Outpatient Medications on File Prior to Visit  Medication Sig Dispense Refill   acetaminophen (TYLENOL) 500 MG tablet Take 1,000 mg by mouth 2 (two) times daily.     axitinib (INLYTA) 1 MG tablet Take 2 tablets (2 mg total) by mouth 2 (two) times daily. 120 tablet 0   diazepam (VALIUM) 2 MG tablet Take 1 tablet (2 mg  total) by mouth every 8 (eight) hours as needed for anxiety. 45 tablet 0   diltiazem (CARDIZEM CD) 240 MG 24 hr capsule Take 1 capsule (240 mg total) by mouth in the morning. 90 capsule 3   ELIQUIS 5 MG TABS tablet Take 1 tablet (5 mg total) by mouth 2 (two) times daily. 60 tablet 2   ezetimibe (ZETIA) 10 MG tablet Take 1 tablet (10 mg  total) by mouth daily. (Patient taking differently: Take 10 mg by mouth 2 (two) times a week.) 90 tablet 3   fluticasone (FLONASE) 50 MCG/ACT nasal spray Place 2 sprays into both nostrils daily as needed for allergies.     hydrocortisone (CORTEF) 10 MG tablet Take 1 tablet (10 mg total) by mouth at bedtime. 90 tablet 0   hydrocortisone (CORTEF) 20 MG tablet Take 1 tablet (20 mg total) by mouth every morning. 90 tablet 0   levothyroxine (SYNTHROID) 150 MCG tablet Take 1 tablet (150 mcg total) by mouth every other day. 30 tablet 3   levothyroxine (SYNTHROID) 175 MCG tablet TAKE 1 TABLET BY MOUTH EVERY OTHER DAY 30 tablet 3   loratadine (CLARITIN) 10 MG tablet Take 10 mg by mouth daily as needed for allergies.     morphine (MS CONTIN) 15 MG 12 hr tablet Take 1 tablet (15 mg total) by mouth every 12 (twelve) hours. 30 tablet 0   Multiple Vitamins-Minerals (CENTRUM SILVER 50+MEN) TABS Take 1 tablet by mouth daily with breakfast.     mupirocin ointment (BACTROBAN) 2 % Place 1 Application into the nose daily as needed (sores).     OLANZapine (ZYPREXA) 5 MG tablet Take 1 tablet (5 mg total) by mouth at bedtime. 90 tablet 0   oxyCODONE-acetaminophen (PERCOCET/ROXICET) 5-325 MG tablet Take 1 tablet by mouth every 6 (six) hours as needed for severe pain. 60 tablet 0   posaconazole (NOXAFIL) 100 MG TBEC delayed-release tablet Take 3 tablets (300 mg total) by mouth daily with breakfast. 90 tablet 2   potassium chloride SA (KLOR-CON M) 20 MEQ tablet Take 1 tablet (20 mEq total) by mouth daily. 7 tablet 0   traZODone (DESYREL) 150 MG tablet Take 150 mg by mouth at bedtime as needed for sleep.     VITAMIN D PO Take 1 capsule by mouth daily.     No current facility-administered medications on file prior to visit.   Allergies  Allergen Reactions   Diphenhydramine Other (See Comments)    Severe restless legs   Family History  Problem Relation Age of Onset   Heart attack Father    Cardiologist: Dr.  Royann Shivers.  PE: BP (!) 150/80   Pulse 84   Ht 5\' 8"  (1.727 m)   Wt 162 lb 9.6 oz (73.8 kg)   SpO2 98%   BMI 24.72 kg/m  Wt Readings from Last 3 Encounters:  08/03/22 162 lb 9.6 oz (73.8 kg)  07/23/22 164 lb 3.2 oz (74.5 kg)  07/22/22 166 lb (75.3 kg)   Constitutional: overweight, in NAD Eyes: EOMI, no exophthalmos ENT: no masses palpated in neck, no cervical lymphadenopathy Cardiovascular: RRR, No MRG Respiratory: CTA B Musculoskeletal: no deformities Skin:  no rashes Neurological: no tremor with outstretched hands  ASSESSMENT: 1. Primary Hypothyroidism  2. Central Adrenal Insufficiency  3. Hyponatremia -Low sodium: 115 during his admission from 03/2018, likely due to uncontrolled hypothyroidism and central adrenal insufficiency, which are not treated -Sodium normalized afterwards.  PLAN:  1. Patient with hypothyroidism, on levothyroxine -Likely secondary to ipilimumab -He  was admitted in 03/2018 and the TSH was found to be 86.  He was initially given levothyroxine IV, then 50 mcg p.o. we subsequently increase the dose to 175 mcg daily in 01/2020, but afterwards we decreased the dose slightly - latest thyroid labs reviewed with pt. >> TSH was suppressed: Lab Results  Component Value Date   TSH 0.182 (L) 07/13/2022  - he continues on LT4 175 alternating with 150 mcg qod - pt feels good on this dose.  He lost 12 pounds since last visit.  Discussed that this could have decreased his levothyroxine requirements >> will decrease the dose of levothyroxine to 150 mcg daily. - we discussed about taking the thyroid hormone every day, with water, >30 minutes before breakfast, separated by >4 hours from acid reflux medications, calcium, iron, multivitamins. Pt. is taking it correctly. - will check thyroid tests in 1.5 mo: TSH and fT4 - If labs are abnormal, he will need to return for repeat TFTs in 1.5 months - will see him back in 1 year  2. Central Adrenal Insufficiency -Also  likely secondary to ipilimumab -Since last visit, he did not have to double the dose of hydrocortisone or take it parenterally -he was previously on hydrocortisone 20 mg in a.m. and 10 mg in p.m. and I advised him to decrease the dose to a more physiologic 10 mg in a.m. and 5 mg in p.m.  At that time, he misunderstood instructions and was only taking 5 mg twice a day but afterwards especially during his radiation therapy, he started to feel more fatigued so he increased the dose to 10 mg in a.m. and 5 mg in p.m. He was feeling well on this dose at last visit, but now he describes fatigue.  Upon questioning, his dose of hydrocortisone was increased since last visit.  We discussed about trying to reduce the dose and moving the second dose of the day from dinnertime to approximately 2 PM. -He has a medical alert bracelet mentioning "adrenal insufficiency" -We again discussed about sick day rules: If you cannot keep anything down, including your hydrocortisone medication, please go to the emergency room or your primary care physician office to get steroids injected in the muscle or vein.  If you have a fever (more than 100 Fahrenheit) or gastroenteritis with nausea/vomiting and diarrhea, please double the dose of your hydrocortisone for the duration of the fever or the gastroenteritis. Do not run out of your hydrocortisone medication.  I advised him to: Patient Instructions  Please decrease the Hydrocortisone to: - 20 mg in am and 5 mg in the pm (around 2 pm) for 3 weeks, then decrease to: - 15 mg in am and 5 mg in the pm  - You absolutely need to take this medication every day and not skip doses. - Please double the dose if you have a fever, for the duration of the fever. - If you cannot take anything by mouth (vomiting) or you have severe diarrhea so that you eliminate the hydrocortisone pills in your stool, please make sure that you get the hydrocortisone in the vein instead - go to the nearest  emergency department/urgent care or you may go to your PCPs office  - Please get a MedAlert bracelet or pendant indicating: "Adrenal insufficiency".  Please decrease: - Levothyroxine 150 mcg every day.  Take the thyroid hormone every day, with water, at least 30 minutes before breakfast, separated by at least 4 hours from: - acid reflux medications - calcium -  iron - multivitamins  Please come back for a follow-up appointment in 1 year, but for labs in 5 weeks (no later than 06/22).  Orders Placed This Encounter  Procedures   TSH   T4, free   Carlus Pavlov, MD PhD [facility] Endocrinology

## 2022-08-05 ENCOUNTER — Telehealth: Payer: Self-pay | Admitting: Medical Oncology

## 2022-08-05 NOTE — Progress Notes (Unsigned)
[facility] OFFICE PROGRESS NOTE  [name], MD [address] [place] [address]  DIAGNOSIS: Stage IV clear-cell renal cell carcinoma diagnosed initially in 2016 as localized T1 a disease with evidence of metastatic disease to the liver and omentum in 2019.   PRIOR THERAPY: 1) status post radical right renal nephrectomy under the care of Dr. Marlou Porch on February 01, 2015 and the final pathology revealed 3.3 cm clear renal cell carcinoma with Fuhrman grade 3. 2) status post omental biopsy in July 2019 that confirmed the presence of recurrent metastatic renal cell carcinoma. 3) status post treatment with immunotherapy with ipilimumab 1 Mg/KG and nivolumab 3 mg/KG every 3 weeks started 09/21/2017 status post 4 cycles and the patient did not receive any additional immunotherapy secondary to adrenal insufficiency and panhypopituitarism 4) status post radiotherapy to the abdominal lymph node completed November 08, 2019. 5) Cabometyx initially started at 40 mg p.o. daily in March 2023 and reduced to 20 mg p.o. daily in April 2023.  This was discontinued in June 2024 secondary to disease progression.  CURRENT THERAPY: Axitinib 2 mg p.o. twice daily. First dose on 07/29/22.    INTERVAL HISTORY: [name] 77 y.o. male returns to the clinic today for a follow-up visit accompanied by his daughter. The patient was last seen by Dr. Arbutus Ped on 07/23/2022.  The patient has questionable disease involvement of the maxillary sinus versus fungal infection for which she is seeing infectious disease.  When he was last seen, the patient's staging CT scan at that time showed disease progression.  Therefore, Dr. Arbutus Ped change his treatment to Axinib. He started this on 07/29/22.  He is on a reduced dose of this secondary to drug to drug interactions with pasaconazole, which he is expected to be on for about 2 more months or so.   Thus far, he has tolerated this well. He did not notice any  appreciable side effects.  His main complaint is related to not having an appetite.  His weight is stable.  He is drinking Ensure 1-2 times per day.  He is on Zyprexa for anxiety.  He has anemia and he is currently taking an iron supplement.  He follows closely with palliative care and is taking oxycodone every 6 hours, Valium, anddenzapine.  He needs a refill of his oxycodone.  He denies any rashes or skin changes.  He denies any unusual diarrhea.  Denies any fever or chills.  He sometimes has night sweats which has been going on for few months.  Denies any changes with shortness of breath.  Denies any chest pain, hemoptysis, or cough.  Denies any nausea, vomiting, diarrhea, or constipation.  He is here today for evaluation and repeat blood work.      MEDICAL HISTORY: Past Medical History:  Diagnosis Date   Arthritis    Chronic kidney disease    only has one kidney    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 02/01/2015   Coronary artery disease    followed by Dr.Tilley   Frequent PVCs    GERD (gastroesophageal reflux disease)    Heart murmur    never has caused any problems   Hx of cancer of lung 1980's   Hyperlipidemia    Hypertension    Hypothyroidism    Incisional hernia 08/01/2015   Lung cancer (HCC) 1993   Lung metastases 2019   Pneumonia    x several   Recurrent umbilical hernia 08/01/2015  ALLERGIES:  is allergic to diphenhydramine.  MEDICATIONS:  Current Outpatient Medications  Medication Sig Dispense Refill   acetaminophen (TYLENOL) 500 MG tablet Take 1,000 mg by mouth 2 (two) times daily.     axitinib (INLYTA) 1 MG tablet Take 2 tablets (2 mg total) by mouth 2 (two) times daily. 120 tablet 0   diazepam (VALIUM) 2 MG tablet Take 1 tablet (2 mg total) by mouth every 8 (eight) hours as needed for anxiety. 45 tablet 0   diltiazem (CARDIZEM CD) 240 MG 24 hr capsule Take 1 capsule (240 mg total) by mouth in the morning. 90 capsule 3   ELIQUIS 5  MG TABS tablet Take 1 tablet (5 mg total) by mouth 2 (two) times daily. 60 tablet 2   ezetimibe (ZETIA) 10 MG tablet Take 1 tablet (10 mg total) by mouth daily. (Patient taking differently: Take 10 mg by mouth 2 (two) times a week.) 90 tablet 3   fluticasone (FLONASE) 50 MCG/ACT nasal spray Place 2 sprays into both nostrils daily as needed for allergies.     hydrocortisone (CORTEF) 20 MG tablet Take 1 tablet (20 mg total) by mouth every morning. 90 tablet 0   hydrocortisone (CORTEF) 5 MG tablet Take by mouth 15 mg in am and 5 mg in pm 400 tablet 3   levothyroxine (SYNTHROID) 150 MCG tablet Take 1 tablet (150 mcg total) by mouth daily before breakfast. 90 tablet 3   loratadine (CLARITIN) 10 MG tablet Take 10 mg by mouth daily as needed for allergies.     morphine (MS CONTIN) 15 MG 12 hr tablet Take 1 tablet (15 mg total) by mouth every 12 (twelve) hours. 30 tablet 0   Multiple Vitamins-Minerals (CENTRUM SILVER 50+MEN) TABS Take 1 tablet by mouth daily with breakfast.     mupirocin ointment (BACTROBAN) 2 % Place 1 Application into the nose daily as needed (sores).     OLANZapine (ZYPREXA) 5 MG tablet Take 1 tablet (5 mg total) by mouth at bedtime. 90 tablet 0   oxyCODONE-acetaminophen (PERCOCET/ROXICET) 5-325 MG tablet Take 1 tablet by mouth every 6 (six) hours as needed for severe pain. 60 tablet 0   posaconazole (NOXAFIL) 100 MG TBEC delayed-release tablet Take 3 tablets (300 mg total) by mouth daily with breakfast. 90 tablet 2   potassium chloride SA (KLOR-CON M) 20 MEQ tablet Take 1 tablet (20 mEq total) by mouth daily. 7 tablet 0   traZODone (DESYREL) 150 MG tablet Take 150 mg by mouth at bedtime as needed for sleep.     VITAMIN D PO Take 1 capsule by mouth daily.     No current facility-administered medications for this visit.    SURGICAL HISTORY:  Past Surgical History:  Procedure Laterality Date   APPENDECTOMY  age 75   ARTERY BIOPSY Left 04/10/2022   Procedure: LEFT TEMPORAL ARTERY  BIOPSY;  Surgeon: Chuck Hint, MD;  Location: [facility] OR;  Service: Vascular;  Laterality: Left;   CHOLECYSTECTOMY  10/18/1978   EYE SURGERY Bilateral 2019   cataract   LAPAROSCOPIC LYSIS OF ADHESIONS N/A 08/01/2015   Procedure: LAPAROSCOPIC LYSIS OF ADHESIONS;  Surgeon: Karie Soda, MD;  Location: [facility] ORS;  Service: General;  Laterality: N/A;   LUNG LOBECTOMY  02/16/1990   lung cancer- patient has staples in lung not to have MRI per patient    MAXILLARY ANTROSTOMY Left 05/22/2022   Procedure: MAXILLARY ANTROSTOMY;  Surgeon: Laren Boom, DO;  Location: [facility] OR;  Service: ENT;  Laterality: Left;  PILONIDAL CYST EXCISION  02/16/2009   Dr Gerrit Friends   ROBOTIC ASSITED PARTIAL NEPHRECTOMY Right 02/01/2015   Procedure: RIGHT ROBOTIC ASSISTED LAPAROSOCOPY NEPHRECTOMY;  Surgeon: Crist Fat, MD;  Location: [facility] ORS;  Service: Urology;  Laterality: Right;   SINUS ENDO WITH FUSION Left 05/22/2022   Procedure: SINUS ENDO WITH FUSION;  Surgeon: Laren Boom, DO;  Location: [facility] OR;  Service: ENT;  Laterality: Left;   VENTRAL HERNIA REPAIR N/A 08/01/2015   Procedure: LAPAROSCOPIC VENTRAL WALL HERNIA WITH MESH;  Surgeon: Karie Soda, MD;  Location: [facility] ORS;  Service: General;  Laterality: N/A;    REVIEW OF SYSTEMS:   Review of Systems  Constitutional: Positive for fatigue and diminished appetite.  Negative for chills, fever and unexpected weight change.  HENT: Positive for facial pain and limited ability to open jaw.  Negative for mouth sores, nosebleeds, sore throat and trouble swallowing.   Eyes: Negative for eye problems and icterus.  Respiratory: Negative for cough, hemoptysis, shortness of breath and wheezing.   Cardiovascular: Negative for chest pain and leg swelling.  Gastrointestinal: Negative for abdominal pain, constipation, diarrhea, nausea and vomiting.  Genitourinary: Negative for bladder incontinence, difficulty urinating, dysuria, frequency and hematuria.    Musculoskeletal: Negative for back pain, gait problem, neck pain and neck stiffness.  Skin: Negative for itching and rash.  Neurological: Negative for dizziness, extremity weakness, gait problem, headaches, light-headedness and seizures.  Hematological: Negative for adenopathy. Does not bruise/bleed easily.  Psychiatric/Behavioral: Negative for confusion, depression and sleep disturbance. The patient is not nervous/anxious.     PHYSICAL EXAMINATION:  Blood pressure 130/68, pulse 78, temperature 97.9 F (36.6 C), temperature source Temporal, resp. rate 17, weight 162 lb 3.2 oz (73.6 kg), SpO2 97 %.  ECOG PERFORMANCE STATUS: 1  Physical Exam  Constitutional: Oriented to person, place, and time and well-developed, well-nourished, and in no distress.  HENT:  Head: Normocephalic and atraumatic.  Mouth/Throat: Difficulty opening jaw.  Eyes: Conjunctivae are normal. Right eye exhibits no discharge. Left eye exhibits no discharge. No scleral icterus.  Neck: Normal range of motion. Neck supple.  Cardiovascular: Normal rate, regular rhythm, normal heart sounds and intact distal pulses.   Pulmonary/Chest: Effort normal and breath sounds normal. No respiratory distress. No wheezes. No rales.  Abdominal: Soft. Bowel sounds are normal. Exhibits no distension and no mass. There is no tenderness.  Musculoskeletal: Normal range of motion. Exhibits no edema.  Lymphadenopathy:    No cervical adenopathy.  Neurological: Alert and oriented to person, place, and time. Exhibits normal muscle tone. Gait normal. Coordination normal.  Skin: Skin is warm and dry. No rash noted. Not diaphoretic. No erythema. No pallor.  Psychiatric: Mood, memory and judgment normal.  Vitals reviewed.  LABORATORY DATA: Lab Results  Component Value Date   WBC 7.6 08/06/2022   HGB 8.4 (L) 08/06/2022   HCT 28.4 (L) 08/06/2022   MCV 81.4 08/06/2022   PLT 321 08/06/2022      Chemistry      Component Value Date/Time   NA  136 08/06/2022 1506   NA 141 08/11/2021 1252   K 4.3 08/06/2022 1506   CL 96 (L) 08/06/2022 1506   CO2 32 08/06/2022 1506   BUN 13 08/06/2022 1506   BUN 14 08/11/2021 1252   CREATININE 1.04 08/06/2022 1506      Component Value Date/Time   CALCIUM 9.1 08/06/2022 1506   ALKPHOS 80 08/06/2022 1506   AST 18 08/06/2022 1506   ALT 20 08/06/2022 1506  BILITOT 0.3 08/06/2022 1506       RADIOGRAPHIC STUDIES:  CT Maxillofacial W Contrast  Result Date: 07/13/2022 CLINICAL DATA:  Altered mental status, possible dehydration, history of pancreatic, lung, and kidney cancer, maxillary sinus mass EXAM: CT HEAD WITHOUT CONTRAST CT MAXILLOFACIAL WITHOUT CONTRAST TECHNIQUE: Multidetector CT imaging of the head and maxillofacial structures were performed using the standard protocol without intravenous contrast. Multiplanar CT image reconstructions of the maxillofacial structures were also generated. RADIATION DOSE REDUCTION: This exam was performed according to the departmental dose-optimization program which includes automated exposure control, adjustment of the mA and/or kV according to patient size and/or use of iterative reconstruction technique. COMPARISON:  None Available. FINDINGS: CT HEAD FINDINGS Brain: No evidence of acute infarction, hemorrhage, hydrocephalus, extra-axial collection or mass lesion/mass effect. Vascular: No hyperdense vessel or unexpected calcification. CT FACIAL BONES FINDINGS Skull: Normal. Negative for fracture or focal lesion. Facial bones: No displaced fractures or dislocations. Sinuses/Orbits: Similar appearance of erosion and destruction of the posterior wall of the left maxillary sinus with severe sinus wall thickening and air loculations and soft tissue in the left masticator space (series 5, image 13). Status post left maxillary antrostomy. Other: None. IMPRESSION: 1. No acute intracranial pathology. 2. No displaced fractures or dislocations of the facial bones. 3. Similar  appearance of erosion and destruction of the posterior wall of the left maxillary sinus with severe sinus wall thickening and air loculations and soft tissue in the left masticator space. Differential considerations remain as previously described on examination dated 05/19/2022 and include chronic, aggressive infection with associated soft tissue abscess component as well as metastatic from patient's known primary malignancies. Electronically Signed   By: Jearld Lesch M.D.   On: 07/13/2022 18:53   CT Head Wo Contrast  Result Date: 07/13/2022 CLINICAL DATA:  Altered mental status, possible dehydration, history of pancreatic, lung, and kidney cancer, maxillary sinus mass EXAM: CT HEAD WITHOUT CONTRAST CT MAXILLOFACIAL WITHOUT CONTRAST TECHNIQUE: Multidetector CT imaging of the head and maxillofacial structures were performed using the standard protocol without intravenous contrast. Multiplanar CT image reconstructions of the maxillofacial structures were also generated. RADIATION DOSE REDUCTION: This exam was performed according to the departmental dose-optimization program which includes automated exposure control, adjustment of the mA and/or kV according to patient size and/or use of iterative reconstruction technique. COMPARISON:  None Available. FINDINGS: CT HEAD FINDINGS Brain: No evidence of acute infarction, hemorrhage, hydrocephalus, extra-axial collection or mass lesion/mass effect. Vascular: No hyperdense vessel or unexpected calcification. CT FACIAL BONES FINDINGS Skull: Normal. Negative for fracture or focal lesion. Facial bones: No displaced fractures or dislocations. Sinuses/Orbits: Similar appearance of erosion and destruction of the posterior wall of the left maxillary sinus with severe sinus wall thickening and air loculations and soft tissue in the left masticator space (series 5, image 13). Status post left maxillary antrostomy. Other: None. IMPRESSION: 1. No acute intracranial pathology. 2. No  displaced fractures or dislocations of the facial bones. 3. Similar appearance of erosion and destruction of the posterior wall of the left maxillary sinus with severe sinus wall thickening and air loculations and soft tissue in the left masticator space. Differential considerations remain as previously described on examination dated 05/19/2022 and include chronic, aggressive infection with associated soft tissue abscess component as well as metastatic from patient's known primary malignancies. Electronically Signed   By: Jearld Lesch M.D.   On: 07/13/2022 18:53   CT ABDOMEN PELVIS W CONTRAST  Result Date: 07/13/2022 CLINICAL DATA:  Abdominal pain, acute, nonlocalized EXAM:  CT ABDOMEN AND PELVIS WITH CONTRAST TECHNIQUE: Multidetector CT imaging of the abdomen and pelvis was performed using the standard protocol following bolus administration of intravenous contrast. RADIATION DOSE REDUCTION: This exam was performed according to the departmental dose-optimization program which includes automated exposure control, adjustment of the mA and/or kV according to patient size and/or use of iterative reconstruction technique. CONTRAST:  OMNIPAQUE IOHEXOL 350 MG/ML SOLN COMPARISON:  Radiograph earlier today. CT 05/03/2022 FINDINGS: Lower chest: Assessed on concurrent chest CTA, reported separately. Hepatobiliary: 4.2 cm hypodense lesion in the left hepatic lobe series 8, image 40, grossly unchanged from prior. There additional subtle hypodense lesions in the right hepatic lobe series 8, image 16, 17, and 35. Lack of contrast on prior exam makes comparison difficult to assess for acuity. Cholecystectomy. Common bile duct is normal in caliber. Pancreas: Heterogeneous mass in the uncinate process of the pancreas has increased in size from prior exam, representative measurement 6.9 x 5.6 cm, series 8, image 41. Pancreatic mass abuts the main portal vein. Adjacent pancreatic nodule measuring 19 mm, series 8, image 32,  increased from 8 mm. Peripancreatic node anteriorly measures 12 mm series 8, image 46, unchanged. Mild pancreatic ductal dilatation at 5 mm. Faint stranding about the pancreatic tail is similar, unchanged. Spleen: Normal in size without focal abnormality. Adrenals/Urinary Tract: Normal adrenal glands. Prior right nephrectomy with stable appearance of fat necrosis in the nephrectomy bed. There is no left hydronephrosis. Tiny subcentimeter hypodensities in the left kidney are too small to characterize, no specific imaging follow-up is needed. There is excreted IV contrast in the urinary bladder which is only minimally distended. Stomach/Bowel: Colonic diverticulosis which is prominent in the sigmoid. No diverticulitis. The uncinate process mass is intimately related to the second and third portion of the duodenum as before. No evidence of gastric, duodenal, or small bowel obstruction. Appendix is not seen. Vascular/Lymphatic: Advanced aortic atherosclerosis. Left periaortic nodes are increasing, for example 11 mm, series 8, image 51, previously 9 mm. Peripancreatic lymph nodes as described above. Reproductive: Prostate is unremarkable. Other: Trace free fluid in the pelvis. No free intra-abdominal air. Musculoskeletal: There are no acute or suspicious osseous abnormalities. IMPRESSION: 1. Known pancreatic mass is increased in size from prior exam. Increased size of peripancreatic and retroperitoneal nodes. 2. Slight increased size of the dominant right hepatic lesion. There are multiple small hypodense liver lesions suspicious for metastasis, unclear if these are new from prior exam is previous or not obtained with contrast. 3. Colonic diverticulosis without diverticulitis. 4. Prior right nephrectomy with stable appearance of fat necrosis in the nephrectomy bed. Aortic Atherosclerosis (ICD10-I70.0). Electronically Signed   By: Narda Rutherford M.D.   On: 07/13/2022 18:46   CT Angio Chest PE W and/or Wo  Contrast  Result Date: 07/13/2022 CLINICAL DATA:  Pulmonary embolism (PE) suspected, high prob EXAM: CT ANGIOGRAPHY CHEST WITH CONTRAST TECHNIQUE: Multidetector CT imaging of the chest was performed using the standard protocol during bolus administration of intravenous contrast. Multiplanar CT image reconstructions and MIPs were obtained to evaluate the vascular anatomy. Performed in conjunction with CT of the abdomen and pelvis, reported separately. RADIATION DOSE REDUCTION: This exam was performed according to the departmental dose-optimization program which includes automated exposure control, adjustment of the mA and/or kV according to patient size and/or use of iterative reconstruction technique. CONTRAST:  OMNIPAQUE IOHEXOL 350 MG/ML SOLN COMPARISON:  Radiograph earlier today. Chest CT 05/03/2022 FINDINGS: Cardiovascular: There are no filling defects within the pulmonary arteries to suggest pulmonary  embolus. Atherosclerosis of the thoracic aorta. No acute aortic findings. Upper normal heart size. Coronary artery calcifications. No significant pericardial effusion. Mediastinum/Nodes: No mediastinal or hilar adenopathy. No thyroid nodule. Decompressed esophagus. Lungs/Pleura: Prior right lower lobectomy. Mild scarring in the right hemithorax related to prior surgery. There is bronchial thickening without focal airspace disease. Mild mosaic attenuation of pulmonary parenchyma. 4 mm sub solid nodule in the left lower lobe series 12, image 67 is unchanged prior exam. Recommend attention at oncologic follow-up. Intra no new pulmonary nodules. No pleural effusion. Upper Abdomen: Assessed on concurrent abdominal CT, reported separately. Musculoskeletal: There are no acute or suspicious osseous abnormalities. Schmorl's nodes throughout the spine. No chest wall soft tissue abnormalities. Review of the MIP images confirms the above findings. IMPRESSION: 1. No pulmonary embolus. 2. Bronchial thickening with mild  mosaic attenuation of pulmonary parenchyma, can be seen with small airways disease. 3. Prior right lower lobectomy. Aortic Atherosclerosis (ICD10-I70.0). Electronically Signed   By: Narda Rutherford M.D.   On: 07/13/2022 18:34   DG Abd Portable 2 Views  Result Date: 07/13/2022 CLINICAL DATA:  Abdominal pain. Dehydration. Pancreatic, lung, and renal carcinoma. EXAM: PORTABLE ABDOMEN - 2 VIEW COMPARISON:  KUB 03/14/2018, CT abdomen and pelvis 05/03/2022 FINDINGS: Air is seen throughout the colon common continuity descending colon and sigmoid colon distally. Overall paucity of small bowel gas. No dilated loops of bowel are seen. No free air is seen on right-sided up decubitus view. No portal venous gas or pneumatosis. Moderate L5-S1 and mild L4-5 degenerative disc and endplate changes. Cholecystectomy clips. Unchanged sclerotic focus within the right proximal femoral intertrochanteric region compared to 12/26/2019 CT, likely a bone island. Vascular phleboliths overlie the pelvis. IMPRESSION: Overall paucity of small bowel gas. No dilated loops of bowel to indicate bowel obstruction. Electronically Signed   By: Neita Garnet M.D.   On: 07/13/2022 17:05   DG Chest 2 View  Result Date: 07/13/2022 CLINICAL DATA:  Pancreatic, lung, and renal cancer. Altered mental status. Being treated for sinus infection. EXAM: CHEST - 2 VIEW COMPARISON:  Chest radiographs 06/24/2022, 07/09/2021, 03/25/2021 FINDINGS: Cardiac silhouette and mediastinal contours are within limits. Surgical clips overlie the right hilum status post right lower lung partial resection. There is chronic blunting of the right costophrenic angle, postsurgical scarring. The lungs are clear. No pleural effusion or pneumothorax. Mild multilevel degenerative disc changes of the thoracic spine. IMPRESSION: 1. No active cardiopulmonary disease. 2. Postsurgical changes of the right lung. Electronically Signed   By: Neita Garnet M.D.   On: 07/13/2022 17:00      ASSESSMENT/PLAN:  This is a very pleasant 77 year old Caucasian male with Stage IV clear-cell renal cell carcinoma diagnosed initially in 2016 as localized T1 a disease with evidence of metastatic disease to the liver and omentum in 2019. The patient underwent the following treatment:  1) status post radical right renal nephrectomy under the care of Dr. Marlou Porch on February 01, 2015 and the final pathology revealed 3.3 cm clear renal cell carcinoma with Fuhrman grade 3. 2) status post omental biopsy in July 2019 that confirmed the presence of recurrent metastatic renal cell carcinoma. 3) status post treatment with immunotherapy with ipilimumab 1 Mg/KG and nivolumab 3 mg/KG every 3 weeks started 09/21/2017 status post 4 cycles and the patient did not receive any additional immunotherapy secondary to adrenal insufficiency and panhypopituitarism 4) status post radiotherapy to the abdominal lymph node completed November 08, 2019.  He was on treatment with Cabometyx 20 mg p.o.  daily started March 2023. This supposed to be on hold while he had sinus issues and concern for fungal infection. He was on treatment with voriconazole by infectious disease.  He was supposed to be holding his treatment with Cabometyx but his daughter mentioned that the patient had been taking it regularly. This was disontinued in June 2024 due to disease progrsesion.   His CT scan showed abdomen including the pancreatic mass as well as peripancreatic and retroperitoneal lymphadenopathy as well as liver lesions.   Dr. Arbutus Ped recommended treatment with axitinib 2 mg BID. He started this on 07/29/22 and thus far, is tolerating well.  Because of the drug to drug interaction with posaconazole, his dose is 2 mg p.o. twice daily.  Dr. Arbutus Ped will likely increase this in the future once he is completed with his antifungal treatment.   Labs were reviewed.  Recommend that he continue on the same treatment at the same dose.  We will see  him back for follow-up visit in 2 weeks for evaluation repeat blood work.  He will continue to follow with the palliative care team for pain management.  He is currently taking oxycodone every 6 hours.  Palliative care is out of the office this week.  The patient needs a refill of his oxycodone.  I sent the refill to the patient's pharmacy. I  We discussed medications to help stimulate his appetite.  We also discussed the side effect profile and the drug to drug interactions.  The patient would not be a good candidate for steroids since he is currently taking hydrocortisone.  He is likely not a good candidate for Remeron since he is taking Zyprexa.  The patient is already struggling with fatigue and does not want to take Marinol.  The patient is not interested in Megace due to the side effect profile.  He will try his best to increase his food intake.  He is also drinking Ensure at least once per day.  He will continue and increase his Ensure intake.  The patient was advised to call immediately if she has any concerning symptoms in the interval. The patient voices understanding of current disease status and treatment options and is in agreement with the current care plan. All questions were answered. The patient knows to call the clinic with any problems, questions or concerns. We can certainly see the patient much sooner if necessary        Orders Placed This Encounter  Procedures   CBC with Differential (Cancer Center Only)    Standing Status:   Future    Standing Expiration Date:   08/06/2023   CMP (Cancer Center only)    Standing Status:   Future    Standing Expiration Date:   08/06/2023    The total time spent in the appointment was 20-29 minutes  Aarin Bluett L Aldric Wenzler, PA-C 08/06/22

## 2022-08-05 NOTE — Telephone Encounter (Signed)
Appts confirmed. 

## 2022-08-06 ENCOUNTER — Inpatient Hospital Stay: Payer: Medicare HMO | Admitting: Physician Assistant

## 2022-08-06 ENCOUNTER — Other Ambulatory Visit: Payer: Self-pay

## 2022-08-06 ENCOUNTER — Inpatient Hospital Stay: Payer: Medicare HMO

## 2022-08-06 VITALS — BP 130/68 | HR 78 | Temp 97.9°F | Resp 17 | Wt 162.2 lb

## 2022-08-06 DIAGNOSIS — R6884 Jaw pain: Secondary | ICD-10-CM | POA: Diagnosis not present

## 2022-08-06 DIAGNOSIS — C7889 Secondary malignant neoplasm of other digestive organs: Secondary | ICD-10-CM | POA: Diagnosis not present

## 2022-08-06 DIAGNOSIS — E860 Dehydration: Secondary | ICD-10-CM | POA: Diagnosis not present

## 2022-08-06 DIAGNOSIS — G893 Neoplasm related pain (acute) (chronic): Secondary | ICD-10-CM

## 2022-08-06 DIAGNOSIS — Z9221 Personal history of antineoplastic chemotherapy: Secondary | ICD-10-CM | POA: Diagnosis not present

## 2022-08-06 DIAGNOSIS — Z515 Encounter for palliative care: Secondary | ICD-10-CM | POA: Diagnosis not present

## 2022-08-06 DIAGNOSIS — D649 Anemia, unspecified: Secondary | ICD-10-CM | POA: Diagnosis not present

## 2022-08-06 DIAGNOSIS — Z905 Acquired absence of kidney: Secondary | ICD-10-CM | POA: Diagnosis not present

## 2022-08-06 DIAGNOSIS — C649 Malignant neoplasm of unspecified kidney, except renal pelvis: Secondary | ICD-10-CM

## 2022-08-06 DIAGNOSIS — C787 Secondary malignant neoplasm of liver and intrahepatic bile duct: Secondary | ICD-10-CM | POA: Diagnosis not present

## 2022-08-06 DIAGNOSIS — C641 Malignant neoplasm of right kidney, except renal pelvis: Secondary | ICD-10-CM | POA: Diagnosis not present

## 2022-08-06 DIAGNOSIS — C778 Secondary and unspecified malignant neoplasm of lymph nodes of multiple regions: Secondary | ICD-10-CM | POA: Diagnosis not present

## 2022-08-06 DIAGNOSIS — Z923 Personal history of irradiation: Secondary | ICD-10-CM | POA: Diagnosis not present

## 2022-08-06 LAB — CBC WITH DIFFERENTIAL (CANCER CENTER ONLY)
Abs Immature Granulocytes: 0.04 K/uL (ref 0.00–0.07)
Basophils Absolute: 0 K/uL (ref 0.0–0.1)
Basophils Relative: 0 %
Eosinophils Absolute: 0.2 K/uL (ref 0.0–0.5)
Eosinophils Relative: 2 %
HCT: 28.4 % — ABNORMAL LOW (ref 39.0–52.0)
Hemoglobin: 8.4 g/dL — ABNORMAL LOW (ref 13.0–17.0)
Immature Granulocytes: 1 %
Lymphocytes Relative: 13 %
Lymphs Abs: 1 K/uL (ref 0.7–4.0)
MCH: 24.1 pg — ABNORMAL LOW (ref 26.0–34.0)
MCHC: 29.6 g/dL — ABNORMAL LOW (ref 30.0–36.0)
MCV: 81.4 fL (ref 80.0–100.0)
Monocytes Absolute: 0.8 K/uL (ref 0.1–1.0)
Monocytes Relative: 10 %
Neutro Abs: 5.6 K/uL (ref 1.7–7.7)
Neutrophils Relative %: 74 %
Platelet Count: 321 K/uL (ref 150–400)
RBC: 3.49 MIL/uL — ABNORMAL LOW (ref 4.22–5.81)
RDW: 16.6 % — ABNORMAL HIGH (ref 11.5–15.5)
WBC Count: 7.6 K/uL (ref 4.0–10.5)
nRBC: 0 % (ref 0.0–0.2)

## 2022-08-06 LAB — CMP (CANCER CENTER ONLY)
ALT: 20 U/L (ref 0–44)
AST: 18 U/L (ref 15–41)
Albumin: 3.4 g/dL — ABNORMAL LOW (ref 3.5–5.0)
Alkaline Phosphatase: 80 U/L (ref 38–126)
Anion gap: 8 (ref 5–15)
BUN: 13 mg/dL (ref 8–23)
CO2: 32 mmol/L (ref 22–32)
Calcium: 9.1 mg/dL (ref 8.9–10.3)
Chloride: 96 mmol/L — ABNORMAL LOW (ref 98–111)
Creatinine: 1.04 mg/dL (ref 0.61–1.24)
GFR, Estimated: >60 mL/min (ref >60)
Glucose, Bld: 259 mg/dL — ABNORMAL HIGH (ref 70–99)
Potassium: 4.3 mmol/L (ref 3.5–5.1)
Sodium: 136 mmol/L (ref 135–145)
Total Bilirubin: 0.3 mg/dL (ref 0.3–1.2)
Total Protein: 6.8 g/dL (ref 6.5–8.1)

## 2022-08-06 MED ORDER — OXYCODONE-ACETAMINOPHEN 5-325 MG PO TABS
1.0000 | ORAL_TABLET | Freq: Four times a day (QID) | ORAL | 0 refills | Status: DC | PRN
Start: 2022-08-06 — End: 2022-09-10

## 2022-08-07 DIAGNOSIS — R040 Epistaxis: Secondary | ICD-10-CM | POA: Diagnosis not present

## 2022-08-07 DIAGNOSIS — J3489 Other specified disorders of nose and nasal sinuses: Secondary | ICD-10-CM | POA: Diagnosis not present

## 2022-08-07 DIAGNOSIS — B49 Unspecified mycosis: Secondary | ICD-10-CM | POA: Diagnosis not present

## 2022-08-07 DIAGNOSIS — R252 Cramp and spasm: Secondary | ICD-10-CM | POA: Diagnosis not present

## 2022-08-07 DIAGNOSIS — J329 Chronic sinusitis, unspecified: Secondary | ICD-10-CM | POA: Diagnosis not present

## 2022-08-07 NOTE — Progress Notes (Unsigned)
Palliative Medicine [facility]  [phone]) [phone] [phone]) [phone]   Name: [name] Date: 08/07/2022 MRN: [id number]  DOB: [birth year]  Patient Care Team: [name], MD as PCP - General (Family Medicine) Thurmon Fair, MD as PCP - Cardiology (Cardiology) Karie Soda, MD as Consulting Physician (General Surgery) Crist Fat, MD as Consulting Physician (Urology)    INTERVAL HISTORY: [name] is a 77 y.o. 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 Palliative ask to see for symptom and pain management and goals of care.   SOCIAL HISTORY:    [name] reports that he quit smoking about 34 years ago. His smoking use included cigarettes. He has a 20.00 pack-year smoking history. He has never used smokeless tobacco. He reports that he does not currently use alcohol. He reports that he does not use drugs.  ADVANCE DIRECTIVES:  None on file CODE STATUS: Full code  PAST MEDICAL HISTORY: Past Medical History:  Diagnosis Date   Arthritis    Chronic kidney disease    only has one kidney    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 02/01/2015   Coronary artery disease    followed by Dr.Tilley   Frequent PVCs    GERD (gastroesophageal reflux disease)    Heart murmur    never has caused any problems   Hx of cancer of lung 1980's   Hyperlipidemia    Hypertension    Hypothyroidism    Incisional hernia 08/01/2015   Lung cancer (HCC) 1993   Lung metastases 2019   Pneumonia    x several   Recurrent umbilical hernia 08/01/2015    ALLERGIES:  is allergic to diphenhydramine.  MEDICATIONS:  Current Outpatient Medications  Medication Sig Dispense Refill   acetaminophen (TYLENOL) 500 MG tablet Take 1,000 mg by mouth 2 (two) times daily.     axitinib (INLYTA) 1 MG tablet  Take 2 tablets (2 mg total) by mouth 2 (two) times daily. 120 tablet 0   diazepam (VALIUM) 2 MG tablet Take 1 tablet (2 mg total) by mouth every 8 (eight) hours as needed for anxiety. 45 tablet 0   diltiazem (CARDIZEM CD) 240 MG 24 hr capsule Take 1 capsule (240 mg total) by mouth in the morning. 90 capsule 3   ELIQUIS 5 MG TABS tablet Take 1 tablet (5 mg total) by mouth 2 (two) times daily. 60 tablet 2   ezetimibe (ZETIA) 10 MG tablet Take 1 tablet (10 mg total) by mouth daily. (Patient taking differently: Take 10 mg by mouth 2 (two) times a week.) 90 tablet 3   fluticasone (FLONASE) 50 MCG/ACT nasal spray Place 2 sprays into both nostrils daily as needed for allergies.     hydrocortisone (CORTEF) 20 MG tablet Take 1 tablet (20 mg total) by mouth every morning. 90 tablet 0   hydrocortisone (CORTEF) 5 MG tablet Take by mouth 15 mg in am and 5 mg in pm 400 tablet 3   levothyroxine (SYNTHROID) 150 MCG tablet Take 1 tablet (150 mcg total) by mouth daily before breakfast. 90 tablet 3   loratadine (CLARITIN) 10 MG tablet Take 10 mg by mouth daily as needed for allergies.     morphine (MS CONTIN) 15 MG 12 hr tablet Take 1 tablet (15 mg total) by mouth every 12 (twelve) hours. 30 tablet 0   Multiple Vitamins-Minerals (CENTRUM  SILVER 50+MEN) TABS Take 1 tablet by mouth daily with breakfast.     mupirocin ointment (BACTROBAN) 2 % Place 1 Application into the nose daily as needed (sores).     OLANZapine (ZYPREXA) 5 MG tablet Take 1 tablet (5 mg total) by mouth at bedtime. 90 tablet 0   oxyCODONE-acetaminophen (PERCOCET/ROXICET) 5-325 MG tablet Take 1 tablet by mouth every 6 (six) hours as needed for severe pain. 60 tablet 0   posaconazole (NOXAFIL) 100 MG TBEC delayed-release tablet Take 3 tablets (300 mg total) by mouth daily with breakfast. 90 tablet 2   potassium chloride SA (KLOR-CON M) 20 MEQ tablet Take 1 tablet (20 mEq total) by mouth daily. 7 tablet 0   traZODone (DESYREL) 150 MG tablet Take 150 mg by  mouth at bedtime as needed for sleep.     VITAMIN D PO Take 1 capsule by mouth daily.     No current facility-administered medications for this visit.    VITAL SIGNS: There were no vitals taken for this visit. There were no vitals filed for this visit.  Estimated body mass index is 24.66 kg/m as calculated from the following:   Height as of 08/03/22: 5\' 8"  (1.727 m).   Weight as of 08/06/22: 162 lb 3.2 oz (73.6 kg).     Latest Ref Rng & Units 08/06/2022    3:06 PM 07/23/2022   10:46 AM 07/13/2022    4:16 PM  CBC  WBC 4.0 - 10.5 K/uL 7.6  8.2  13.5   Hemoglobin 13.0 - 17.0 g/dL 8.4  8.6  9.5   Hematocrit 39.0 - 52.0 % 28.4  28.5  31.2   Platelets 150 - 400 K/uL 321  408  461        Latest Ref Rng & Units 08/06/2022    3:06 PM 07/23/2022   10:46 AM 07/13/2022    4:16 PM  CMP  Glucose 70 - 99 mg/dL 371  062  694   BUN 8 - 23 mg/dL 13  10  15    Creatinine 0.61 - 1.24 mg/dL 8.54  6.27  0.35   Sodium 135 - 145 mmol/L 136  141  133   Potassium 3.5 - 5.1 mmol/L 4.3  3.1  3.9   Chloride 98 - 111 mmol/L 96  99  95   CO2 22 - 32 mmol/L 32  33  28   Calcium 8.9 - 10.3 mg/dL 9.1  9.1  8.3   Total Protein 6.5 - 8.1 g/dL 6.8  7.3  7.4   Total Bilirubin 0.3 - 1.2 mg/dL 0.3  0.4  0.6   Alkaline Phos 38 - 126 U/L 80  77  74   AST 15 - 41 U/L 18  12  20    ALT 0 - 44 U/L 20  18  28       PERFORMANCE STATUS (ECOG) : 1 - Symptomatic but completely ambulatory  Assessment NAD, weak appearing RRR Normal breathing pattern AAO x4  IMPRESSION: [name] and his daughter presents to clinic today for symptom management follow-up. Continues to endorse ongoing fatigue. He is trying to remain as active as possible however spends most of his time in the recliner. Daughter states he has 6-7 more weeks on his anti-fungal regimen. They are hopeful once therapies are completed this will improve his fatigue. Denies nausea, vomiting, constipation, or diarrhea. Appetite fluctuates. Some days better than others.    Reports pain is well controlled on regimen. Is taking Percocet as needed. Does  not require around the clock. Valium as needed for anxiety and sleep. We discussed taking 2-4mg  as some days he does not feel that 2mg  is as effective. Refills sent as needed.   We will continue to closely monitor and adjust medications as needed. Patient and family aware to contact office as needed.    PLAN:  Continue with Oxycodone 5/325 mg every 6 hours as needed for moderate to severe pain.  Denzapine 5 mg at bedtime Valium 2-4 mg as needed for anxiety Ongoing symptom management and goals of care support. Palliative will plan to see patient back in 3-4 weeks in collaboration to other oncology appointments.  Patient knows to contact our office if needed sooner.  Patient expressed understanding and was in agreement with this plan. He also understands that He can call the clinic at any time with any questions, concerns, or complaints.   Any controlled substances utilized were prescribed in the context of palliative care. PDMP has been reviewed.   Visit consisted of counseling and education dealing with the complex and emotionally intense issues of symptom management and palliative care in the setting of serious and potentially life-threatening illness.Greater than 50%  of this time was spent counseling and coordinating care related to the above assessment and plan.   Willette Alma, AGPCNP-BC  Palliative Medicine Team/[facility]  *Please note that this is a verbal dictation therefore any spelling or grammatical errors are due to the "[employer]" system interpretation.

## 2022-08-10 ENCOUNTER — Other Ambulatory Visit: Payer: Self-pay

## 2022-08-11 ENCOUNTER — Encounter: Payer: Self-pay | Admitting: Nurse Practitioner

## 2022-08-11 ENCOUNTER — Ambulatory Visit: Payer: Medicare HMO | Admitting: Internal Medicine

## 2022-08-11 ENCOUNTER — Inpatient Hospital Stay (HOSPITAL_BASED_OUTPATIENT_CLINIC_OR_DEPARTMENT_OTHER): Payer: Medicare HMO | Admitting: Nurse Practitioner

## 2022-08-11 ENCOUNTER — Other Ambulatory Visit: Payer: Self-pay

## 2022-08-11 ENCOUNTER — Other Ambulatory Visit: Payer: Medicare HMO

## 2022-08-11 ENCOUNTER — Telehealth: Payer: Self-pay | Admitting: Physician Assistant

## 2022-08-11 VITALS — BP 132/62 | HR 75 | Temp 98.3°F | Resp 18 | Wt 161.0 lb

## 2022-08-11 DIAGNOSIS — G893 Neoplasm related pain (acute) (chronic): Secondary | ICD-10-CM | POA: Diagnosis not present

## 2022-08-11 DIAGNOSIS — Z515 Encounter for palliative care: Secondary | ICD-10-CM

## 2022-08-11 DIAGNOSIS — C649 Malignant neoplasm of unspecified kidney, except renal pelvis: Secondary | ICD-10-CM | POA: Diagnosis not present

## 2022-08-11 DIAGNOSIS — F419 Anxiety disorder, unspecified: Secondary | ICD-10-CM | POA: Diagnosis not present

## 2022-08-11 MED ORDER — DIAZEPAM 2 MG PO TABS
2.0000 mg | ORAL_TABLET | Freq: Three times a day (TID) | ORAL | 0 refills | Status: DC | PRN
Start: 2022-08-11 — End: 2022-09-23

## 2022-08-11 NOTE — Telephone Encounter (Signed)
Scheduled per 06/20 los, patient has been called and notified.

## 2022-08-12 ENCOUNTER — Encounter: Payer: Self-pay | Admitting: Internal Medicine

## 2022-08-13 ENCOUNTER — Other Ambulatory Visit: Payer: Self-pay

## 2022-08-18 ENCOUNTER — Inpatient Hospital Stay: Payer: Medicare HMO | Attending: Oncology

## 2022-08-18 ENCOUNTER — Other Ambulatory Visit: Payer: Self-pay

## 2022-08-18 ENCOUNTER — Telehealth: Payer: Self-pay

## 2022-08-18 DIAGNOSIS — R634 Abnormal weight loss: Secondary | ICD-10-CM | POA: Insufficient documentation

## 2022-08-18 DIAGNOSIS — R6884 Jaw pain: Secondary | ICD-10-CM | POA: Insufficient documentation

## 2022-08-18 DIAGNOSIS — R530 Neoplastic (malignant) related fatigue: Secondary | ICD-10-CM

## 2022-08-18 DIAGNOSIS — C649 Malignant neoplasm of unspecified kidney, except renal pelvis: Secondary | ICD-10-CM

## 2022-08-18 DIAGNOSIS — C787 Secondary malignant neoplasm of liver and intrahepatic bile duct: Secondary | ICD-10-CM | POA: Diagnosis not present

## 2022-08-18 DIAGNOSIS — Z905 Acquired absence of kidney: Secondary | ICD-10-CM | POA: Diagnosis not present

## 2022-08-18 DIAGNOSIS — C641 Malignant neoplasm of right kidney, except renal pelvis: Secondary | ICD-10-CM | POA: Insufficient documentation

## 2022-08-18 DIAGNOSIS — D649 Anemia, unspecified: Secondary | ICD-10-CM | POA: Diagnosis not present

## 2022-08-18 DIAGNOSIS — R630 Anorexia: Secondary | ICD-10-CM | POA: Insufficient documentation

## 2022-08-18 DIAGNOSIS — R5383 Other fatigue: Secondary | ICD-10-CM | POA: Diagnosis not present

## 2022-08-18 MED ORDER — SODIUM CHLORIDE 0.9 % IV SOLN: Freq: Once | INTRAVENOUS | Status: AC

## 2022-08-18 NOTE — Patient Instructions (Signed)

## 2022-08-18 NOTE — Progress Notes (Signed)
IVF orders under signed and held for infusion RN

## 2022-08-18 NOTE — Telephone Encounter (Signed)
Pt dtr called reporting that pt is feeling weak again, per Lowella Bandy, NP pt ot be brought in for IVF. Orders placed under signed and held and pt dtr made aware, no further needs at this time.

## 2022-08-19 ENCOUNTER — Other Ambulatory Visit: Payer: Self-pay | Admitting: Medical Oncology

## 2022-08-19 ENCOUNTER — Inpatient Hospital Stay: Payer: Medicare HMO

## 2022-08-19 ENCOUNTER — Telehealth: Payer: Self-pay | Admitting: Medical Oncology

## 2022-08-19 ENCOUNTER — Other Ambulatory Visit: Payer: Self-pay

## 2022-08-19 DIAGNOSIS — E860 Dehydration: Secondary | ICD-10-CM

## 2022-08-19 DIAGNOSIS — C641 Malignant neoplasm of right kidney, except renal pelvis: Secondary | ICD-10-CM | POA: Diagnosis not present

## 2022-08-19 DIAGNOSIS — D649 Anemia, unspecified: Secondary | ICD-10-CM | POA: Diagnosis not present

## 2022-08-19 DIAGNOSIS — R630 Anorexia: Secondary | ICD-10-CM | POA: Diagnosis not present

## 2022-08-19 DIAGNOSIS — R5383 Other fatigue: Secondary | ICD-10-CM | POA: Diagnosis not present

## 2022-08-19 DIAGNOSIS — R6884 Jaw pain: Secondary | ICD-10-CM | POA: Diagnosis not present

## 2022-08-19 DIAGNOSIS — C787 Secondary malignant neoplasm of liver and intrahepatic bile duct: Secondary | ICD-10-CM | POA: Diagnosis not present

## 2022-08-19 DIAGNOSIS — Z905 Acquired absence of kidney: Secondary | ICD-10-CM | POA: Diagnosis not present

## 2022-08-19 DIAGNOSIS — C649 Malignant neoplasm of unspecified kidney, except renal pelvis: Secondary | ICD-10-CM

## 2022-08-19 DIAGNOSIS — R634 Abnormal weight loss: Secondary | ICD-10-CM | POA: Diagnosis not present

## 2022-08-19 LAB — CBC WITH DIFFERENTIAL (CANCER CENTER ONLY)
Abs Immature Granulocytes: 0.03 10*3/uL (ref 0.00–0.07)
Basophils Absolute: 0 10*3/uL (ref 0.0–0.1)
Basophils Relative: 0 %
Eosinophils Absolute: 0.3 10*3/uL (ref 0.0–0.5)
Eosinophils Relative: 3 %
HCT: 30.2 % — ABNORMAL LOW (ref 39.0–52.0)
Hemoglobin: 8.8 g/dL — ABNORMAL LOW (ref 13.0–17.0)
Immature Granulocytes: 0 %
Lymphocytes Relative: 11 %
Lymphs Abs: 0.9 10*3/uL (ref 0.7–4.0)
MCH: 22.8 pg — ABNORMAL LOW (ref 26.0–34.0)
MCHC: 29.1 g/dL — ABNORMAL LOW (ref 30.0–36.0)
MCV: 78.2 fL — ABNORMAL LOW (ref 80.0–100.0)
Monocytes Absolute: 0.7 10*3/uL (ref 0.1–1.0)
Monocytes Relative: 8 %
Neutro Abs: 6.4 10*3/uL (ref 1.7–7.7)
Neutrophils Relative %: 78 %
Platelet Count: 409 10*3/uL — ABNORMAL HIGH (ref 150–400)
RBC: 3.86 MIL/uL — ABNORMAL LOW (ref 4.22–5.81)
RDW: 17.1 % — ABNORMAL HIGH (ref 11.5–15.5)
WBC Count: 8.2 10*3/uL (ref 4.0–10.5)
nRBC: 0 % (ref 0.0–0.2)

## 2022-08-19 LAB — CMP (CANCER CENTER ONLY)
ALT: 32 U/L (ref 0–44)
AST: 29 U/L (ref 15–41)
Albumin: 3.2 g/dL — ABNORMAL LOW (ref 3.5–5.0)
Alkaline Phosphatase: 433 U/L — ABNORMAL HIGH (ref 38–126)
Anion gap: 8 (ref 5–15)
BUN: 13 mg/dL (ref 8–23)
CO2: 31 mmol/L (ref 22–32)
Calcium: 9.2 mg/dL (ref 8.9–10.3)
Chloride: 99 mmol/L (ref 98–111)
Creatinine: 0.93 mg/dL (ref 0.61–1.24)
GFR, Estimated: >60 mL/min (ref >60)
Glucose, Bld: 156 mg/dL — ABNORMAL HIGH (ref 70–99)
Potassium: 3.4 mmol/L — ABNORMAL LOW (ref 3.5–5.1)
Sodium: 138 mmol/L (ref 135–145)
Total Bilirubin: 0.4 mg/dL (ref 0.3–1.2)
Total Protein: 6.4 g/dL — ABNORMAL LOW (ref 6.5–8.1)

## 2022-08-19 LAB — ABO/RH: ABO/RH(D): B POS

## 2022-08-19 LAB — SAMPLE TO BLOOD BANK

## 2022-08-19 MED ORDER — SODIUM CHLORIDE 0.9 % IV SOLN: INTRAVENOUS | Status: DC

## 2022-08-19 NOTE — Telephone Encounter (Signed)
Per Dr. Arbutus Ped , I told [name] to bring pt in for labs and possible IVF. Schedule message sent.

## 2022-08-19 NOTE — Progress Notes (Signed)
Hgb 8.8

## 2022-08-19 NOTE — Progress Notes (Signed)
**[name] De-Identified via Obfuscation** [facility] OFFICE PROGRESS [name]  Alex [name], Alex [name] [address] [place] [address]  DIAGNOSIS: Stage IV clear-cell renal cell carcinoma diagnosed initially in 2016 as localized T1 a disease with evidence of metastatic disease to the liver and omentum in 2019.   PRIOR THERAPY:  1) status post radical right renal nephrectomy under the care of Alex [name] on February 01, 2015 and the final pathology revealed 3.3 cm clear renal cell carcinoma with Fuhrman grade 3. 2) status post omental biopsy in July 2019 that confirmed the presence of recurrent metastatic renal cell carcinoma. 3) status post treatment with immunotherapy with ipilimumab 1 Mg/KG and nivolumab 3 mg/KG every 3 weeks started 09/21/2017 status post 4 cycles and the patient did not receive any additional immunotherapy secondary to adrenal insufficiency and panhypopituitarism 4) status post radiotherapy to the abdominal lymph node completed November 08, 2019. 5) Cabometyx initially started at 40 mg p.o. daily in March 2023 and reduced to 20 mg p.o. daily in April 2023.  This was discontinued in June 2024 secondary to disease progression.  CURRENT THERAPY: Axitinib 2 mg p.o. twice daily. First dose on 07/29/22.    INTERVAL HISTORY: Alex [name] 77 y.o. male returns to the clinic today for a follow-up visit accompanied by his daughter. The patient was last seen by myself on 08/06/22. The patient has questionable disease involvement of the maxillary sinus versus fungal infection for which he is seeing infectious disease. He is scheduled to see them next on Thursday this week. He stopped his anti-fungal the last few days due to his current symptoms. He believes his symptoms are related to the anti-fungal. He continues to endorse fatigue, weakness, weight loss, and poor appetite but his symptoms are mildly better since stopping this.   In early June 2024, his staging CT scan at that time showed disease progression.   Therefore, Alex [name] change his treatment to Axinib. He started this on 07/29/22.  He is on a reduced dose of this secondary to drug to drug interactions with pasaconazole. Due to some confusion, he was taking this correctly at first, then was taking 1 mg BID as opposed to 2 mg. He will resume taking 2 mg BID  Thus far, he has tolerated this fair but continues to feel pretty poorly with fatigue, poor appetite, weakness, and weight loss. He lost 8 lbs since last being seen. He is drinking Ensure 1-2 times per day.  He is on Zyprexa for anxiety. He called in the interval since last being seen with the chief complaint of weakness, for which he received IVF.  He follows closely with palliative care and is taking oxycodone every 6 hours, Valium, and denzapine. We discussed options for appetite stimulants at his last appointment. He is not a good candidate for Remeron or steroids given drug to drug interactions. Marinol is on back order and can cause drowsiness, which he already struggles with fatigue. He is not interested in megace due to the side effect profile but today, we re-discussed this, and he would be open to this. He is on iron supplements for his stable anemia.     He denies any rashes or skin changes. Denies any fever or chills.  He sometimes has night sweats which has been going on for few months.  Denies any changes with shortness of breath.  Denies any chest pain, hemoptysis, or cough.  Denies any nausea, diarrhea, or constipation. He had one episode of vomiting last week after he  ate. He is here today for evaluation and repeat blood work.    MEDICAL HISTORY: Past Medical History:  Diagnosis Date   Arthritis    Chronic kidney disease    only has one kidney    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 02/01/2015   Coronary artery disease    followed by AlexTilley   Frequent PVCs    GERD (gastroesophageal reflux disease)    Heart murmur    never has caused any  problems   Hx of cancer of lung 1980's   Hyperlipidemia    Hypertension    Hypothyroidism    Incisional hernia 08/01/2015   Lung cancer (HCC) 1993   Lung metastases 2019   Pneumonia    x several   Recurrent umbilical hernia 08/01/2015    ALLERGIES:  is allergic to diphenhydramine.  MEDICATIONS:  Current Outpatient Medications  Medication Sig Dispense Refill   acetaminophen (TYLENOL) 500 MG tablet Take 1,000 mg by mouth 2 (two) times daily.     axitinib (INLYTA) 1 MG tablet Take 2 tablets (2 mg total) by mouth 2 (two) times daily. 120 tablet 0   diazepam (VALIUM) 2 MG tablet Take 1-2 tablets (2-4 mg total) by mouth every 8 (eight) hours as needed for anxiety. 60 tablet 0   diltiazem (CARDIZEM CD) 240 MG 24 hr capsule Take 1 capsule (240 mg total) by mouth in the morning. 90 capsule 3   ELIQUIS 5 MG TABS tablet Take 1 tablet (5 mg total) by mouth 2 (two) times daily. 60 tablet 2   ezetimibe (ZETIA) 10 MG tablet Take 1 tablet (10 mg total) by mouth daily. (Patient taking differently: Take 10 mg by mouth 2 (two) times a week.) 90 tablet 3   fluticasone (FLONASE) 50 MCG/ACT nasal spray Place 2 sprays into both nostrils daily as needed for allergies.     hydrocortisone (CORTEF) 20 MG tablet Take 1 tablet (20 mg total) by mouth every morning. 90 tablet 0   hydrocortisone (CORTEF) 5 MG tablet Take by mouth 15 mg in am and 5 mg in pm 400 tablet 3   levothyroxine (SYNTHROID) 150 MCG tablet Take 1 tablet (150 mcg total) by mouth daily before breakfast. 90 tablet 3   loratadine (CLARITIN) 10 MG tablet Take 10 mg by mouth daily as needed for allergies.     morphine (MS CONTIN) 15 MG 12 hr tablet Take 1 tablet (15 mg total) by mouth every 12 (twelve) hours. 30 tablet 0   Multiple Vitamins-Minerals (CENTRUM SILVER 50+MEN) TABS Take 1 tablet by mouth daily with breakfast.     mupirocin ointment (BACTROBAN) 2 % Place 1 Application into the nose daily as needed (sores).     OLANZapine (ZYPREXA) 5 MG  tablet Take 1 tablet (5 mg total) by mouth at bedtime. 90 tablet 0   oxyCODONE-acetaminophen (PERCOCET/ROXICET) 5-325 MG tablet Take 1 tablet by mouth every 6 (six) hours as needed for severe pain. 60 tablet 0   posaconazole (NOXAFIL) 100 MG TBEC delayed-release tablet Take 3 tablets (300 mg total) by mouth daily with breakfast. 90 tablet 2   potassium chloride SA (KLOR-CON M) 20 MEQ tablet Take 1 tablet (20 mEq total) by mouth daily. 7 tablet 0   traZODone (DESYREL) 150 MG tablet Take 150 mg by mouth at bedtime as needed for sleep.     VITAMIN D PO Take 1 capsule by mouth daily.     No current facility-administered medications for this visit.  SURGICAL HISTORY:  Past Surgical History:  Procedure Laterality Date   APPENDECTOMY  age 15   ARTERY BIOPSY Left 04/10/2022   Procedure: LEFT TEMPORAL ARTERY BIOPSY;  Surgeon: Chuck Hint, Alex [name];  Location: [facility] OR;  Service: Vascular;  Laterality: Left;   CHOLECYSTECTOMY  10/18/1978   EYE SURGERY Bilateral 2019   cataract   LAPAROSCOPIC LYSIS OF ADHESIONS N/A 08/01/2015   Procedure: LAPAROSCOPIC LYSIS OF ADHESIONS;  Surgeon: Karie Soda, Alex [name];  Location: [facility] ORS;  Service: General;  Laterality: N/A;   LUNG LOBECTOMY  02/16/1990   lung cancer- patient has staples in lung not to have MRI per patient    MAXILLARY ANTROSTOMY Left 05/22/2022   Procedure: MAXILLARY ANTROSTOMY;  Surgeon: Laren Boom, DO;  Location: [facility] OR;  Service: ENT;  Laterality: Left;   PILONIDAL CYST EXCISION  02/16/2009   Dr Gerrit Friends   ROBOTIC ASSITED PARTIAL NEPHRECTOMY Right 02/01/2015   Procedure: RIGHT ROBOTIC ASSISTED LAPAROSOCOPY NEPHRECTOMY;  Surgeon: Crist Fat, Alex [name];  Location: [facility] ORS;  Service: Urology;  Laterality: Right;   SINUS ENDO WITH FUSION Left 05/22/2022   Procedure: SINUS ENDO WITH FUSION;  Surgeon: Laren Boom, DO;  Location: [facility] OR;  Service: ENT;  Laterality: Left;   VENTRAL HERNIA REPAIR N/A 08/01/2015   Procedure: LAPAROSCOPIC  VENTRAL WALL HERNIA WITH MESH;  Surgeon: Karie Soda, Alex [name];  Location: [facility] ORS;  Service: General;  Laterality: N/A;    REVIEW OF SYSTEMS:   Constitutional: Positive for fatigue, weakness, weight loss and diminished appetite. Negative for chills, fever and unexpected weight change.  HENT: Positive for facial pain and limited ability to open jaw.  Negative for mouth sores, nosebleeds, sore throat and trouble swallowing.   Eyes: Negative for eye problems and icterus.  Respiratory: Negative for cough, hemoptysis, shortness of breath and wheezing.   Cardiovascular: Negative for chest pain and leg swelling.  Gastrointestinal: Negative for abdominal pain, constipation, diarrhea, nausea and vomiting.  Genitourinary: Negative for bladder incontinence, difficulty urinating, dysuria, frequency and hematuria.   Musculoskeletal: Negative for back pain, gait problem, neck pain and neck stiffness.  Skin: Negative for itching and rash.  Neurological: Negative for dizziness, extremity weakness, gait problem, headaches, light-headedness and seizures.  Hematological: Negative for adenopathy. Does not bruise/bleed easily.  Psychiatric/Behavioral: Negative for confusion, depression and sleep disturbance. The patient is not nervous/anxious.      PHYSICAL EXAMINATION:  Blood pressure (!) 157/74, pulse 81, temperature 98 F (36.7 C), temperature source Oral, resp. rate 16, weight 154 lb 4.8 oz (70 kg), SpO2 97 %.  ECOG PERFORMANCE STATUS: 2-3  Physical Exam  Constitutional: Oriented to person, place, and time and well-developed, well-nourished, and in no distress.  HENT:  Head: Normocephalic and atraumatic.  Mouth/Throat: Positive for hoarseness in the voice Eyes: Conjunctivae are normal. Right eye exhibits no discharge. Left eye exhibits no discharge. No scleral icterus.  Neck: Normal range of motion. Neck supple.  Cardiovascular: Normal rate, regular rhythm, normal heart sounds and intact distal pulses.    Pulmonary/Chest: Effort normal and breath sounds normal. No respiratory distress. No wheezes. No rales.  Abdominal: Soft. Bowel sounds are normal. Exhibits no distension and no mass. There is no tenderness.  Musculoskeletal: Normal range of motion. Exhibits no edema.  Lymphadenopathy:    No cervical adenopathy.  Neurological: Alert and oriented to person, place, and time. Exhibits normal muscle tone. Gait normal. Coordination normal.  Skin: Skin is warm and dry. Positive for pallor. No rash noted. Not diaphoretic. No erythema.  Psychiatric: Mood, memory and judgment normal.  Vitals reviewed.  LABORATORY DATA: Lab Results  Component Value Date   WBC 8.9 08/24/2022   HGB 8.9 (L) 08/24/2022   HCT 30.9 (L) 08/24/2022   MCV 78.2 (L) 08/24/2022   PLT 437 (H) 08/24/2022      Chemistry      Component Value Date/Time   NA 140 08/24/2022 0836   NA 141 08/11/2021 1252   K 3.2 (L) 08/24/2022 0836   CL 99 08/24/2022 0836   CO2 33 (H) 08/24/2022 0836   BUN 13 08/24/2022 0836   BUN 14 08/11/2021 1252   CREATININE 0.94 08/24/2022 0836      Component Value Date/Time   CALCIUM 9.7 08/24/2022 0836   ALKPHOS 282 (H) 08/24/2022 0836   AST 19 08/24/2022 0836   ALT 22 08/24/2022 0836   BILITOT 0.3 08/24/2022 0836       RADIOGRAPHIC STUDIES:  No results found.   ASSESSMENT/PLAN:  This is a very pleasant 77 year old Caucasian male with Stage IV clear-cell renal cell carcinoma diagnosed initially in 2016 as localized T1 a disease with evidence of metastatic disease to the liver and omentum in 2019. The patient underwent the following treatment:   1) status post radical right renal nephrectomy under the care of Alex [name] on February 01, 2015 and the final pathology revealed 3.3 cm clear renal cell carcinoma with Fuhrman grade 3. 2) status post omental biopsy in July 2019 that confirmed the presence of recurrent metastatic renal cell carcinoma. 3) status post treatment with immunotherapy  with ipilimumab 1 Mg/KG and nivolumab 3 mg/KG every 3 weeks started 09/21/2017 status post 4 cycles and the patient did not receive any additional immunotherapy secondary to adrenal insufficiency and panhypopituitarism 4) status post radiotherapy to the abdominal lymph node completed November 08, 2019.   He was on treatment with Cabometyx 20 mg p.o. daily started March 2023. This supposed to be on hold while he had sinus issues and concern for fungal infection. He was on treatment with voriconazole by infectious disease.  He was supposed to be holding his treatment with Cabometyx but his daughter mentioned that the patient had been taking it regularly. This was disontinued in June 2024 due to disease progrsesion.    His CT scan showed abdomen including the pancreatic mass as well as peripancreatic and retroperitoneal lymphadenopathy as well as liver lesions.    Alex [name] recommended treatment with axitinib 2 mg BID. He started this on 07/29/22 and thus far, is tolerating well but overall continues to feel poorly due to fatigue, weakness, weight loss, and poor appetite.  Because of the drug to drug interaction with posaconazole, his dose is 2 mg p.o. twice daily.  Alex [name] will likely increase this in the future once he is completed with his antifungal treatment, which he is expected to be on for a few more months.   I reviewed the patient's symptoms with Alex [name].  The patient is attributing this to his symptoms related to his antifungal medication for which he has held this for the last few days with only mild improvement in his symptoms.  He is expected to see infectious disease later this week to discuss next steps with his antifungal.  During the fatigue and weakness, I will add iron studies, B12, and folate testing with his next lab draw for his stable anemia.  We will also tries starting him on Megace to stimulate his appetite.  He declined IV fluids today  but he was advised to call if he feels  like he needs IV fluid.  Discussed the option of hospice and palliative care versus continuing on treatment.  The patient would like to continue on treatment for now.  Labs were reviewed. He continues to have stable anemia. He is taking iron supplements. Recommend that he continue on the same treatment at the same dose.  Clarified the patient's dose of his medication with him today.  He had been taking this incorrectly for a brief period of time.  He now knows to take 2 mg twice daily   We will see him back for follow-up visit in 2 weeks for evaluation and repeat blood work  Discussed the adverse side effects of Megace including the small risk of blood clots.  The patient is currently taking a blood thinner.  The patient is in agreement to try Megace due to the severe weight loss and poor appetite  He will continue to follow with the palliative care team for pain management. He is currently taking oxycodone every 6 hours.  He is scheduled to see them today.  I let them know we discussed at the appointment today  The patient was advised to call immediately if she has any concerning symptoms in the interval. The patient voices understanding of current disease status and treatment options and is in agreement with the current care plan. All questions were answered. The patient knows to call the clinic with any problems, questions or concerns. We can certainly see the patient much sooner if necessary       No orders of the defined types were placed in this encounter.    The total time spent in the appointment was 30-39 minutes  Rozetta Stumpp L Rodneshia Greenhouse, PA-C 08/24/22

## 2022-08-19 NOTE — Progress Notes (Signed)
labs ordered for today.

## 2022-08-19 NOTE — Telephone Encounter (Addendum)
Still weak today after IVF yesterday. Gives out of breath. No appetite.

## 2022-08-21 NOTE — Progress Notes (Unsigned)
Palliative Medicine [facility]  [phone]) [phone] Fax:([phone]) [phone]   Name: [name] Date: 08/21/2022 MRN: [id number]  DOB: [birth year]  Patient Care Team: [name], MD as PCP - General (Family Medicine) Thurmon Fair, MD as PCP - Cardiology (Cardiology) Karie Soda, MD as Consulting Physician (General Surgery) Crist Fat, MD as Consulting Physician (Urology)    INTERVAL HISTORY: [name] is a 77 y.o. 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 Palliative ask to see for symptom and pain management and goals of care.   SOCIAL HISTORY:    [name] reports that he quit smoking about 34 years ago. His smoking use included cigarettes. He has a 20.00 pack-year smoking history. He has never used smokeless tobacco. He reports that he does not currently use alcohol. He reports that he does not use drugs.  ADVANCE DIRECTIVES:  None on file CODE STATUS: Full code  PAST MEDICAL HISTORY: Past Medical History:  Diagnosis Date   Arthritis    Chronic kidney disease    only has one kidney    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 02/01/2015   Coronary artery disease    followed by Dr.Tilley   Frequent PVCs    GERD (gastroesophageal reflux disease)    Heart murmur    never has caused any problems   Hx of cancer of lung 1980's   Hyperlipidemia    Hypertension    Hypothyroidism    Incisional hernia 08/01/2015   Lung cancer (HCC) 1993   Lung metastases 2019   Pneumonia    x several   Recurrent umbilical hernia 08/01/2015    ALLERGIES:  is allergic to diphenhydramine.  MEDICATIONS:  Current Outpatient Medications  Medication Sig Dispense Refill   acetaminophen (TYLENOL) 500 MG tablet Take 1,000 mg by mouth 2 (two) times daily.     axitinib (INLYTA) 1 MG tablet Take  2 tablets (2 mg total) by mouth 2 (two) times daily. 120 tablet 0   diazepam (VALIUM) 2 MG tablet Take 1-2 tablets (2-4 mg total) by mouth every 8 (eight) hours as needed for anxiety. 60 tablet 0   diltiazem (CARDIZEM CD) 240 MG 24 hr capsule Take 1 capsule (240 mg total) by mouth in the morning. 90 capsule 3   ELIQUIS 5 MG TABS tablet Take 1 tablet (5 mg total) by mouth 2 (two) times daily. 60 tablet 2   ezetimibe (ZETIA) 10 MG tablet Take 1 tablet (10 mg total) by mouth daily. (Patient taking differently: Take 10 mg by mouth 2 (two) times a week.) 90 tablet 3   fluticasone (FLONASE) 50 MCG/ACT nasal spray Place 2 sprays into both nostrils daily as needed for allergies.     hydrocortisone (CORTEF) 20 MG tablet Take 1 tablet (20 mg total) by mouth every morning. 90 tablet 0   hydrocortisone (CORTEF) 5 MG tablet Take by mouth 15 mg in am and 5 mg in pm 400 tablet 3   levothyroxine (SYNTHROID) 150 MCG tablet Take 1 tablet (150 mcg total) by mouth daily before breakfast. 90 tablet 3   loratadine (CLARITIN) 10 MG tablet Take 10 mg by mouth daily as needed for allergies.     morphine (MS CONTIN) 15 MG 12 hr tablet Take 1 tablet (15 mg total) by mouth every 12 (twelve) hours. 30 tablet 0   Multiple Vitamins-Minerals (CENTRUM  SILVER 50+MEN) TABS Take 1 tablet by mouth daily with breakfast.     mupirocin ointment (BACTROBAN) 2 % Place 1 Application into the nose daily as needed (sores).     OLANZapine (ZYPREXA) 5 MG tablet Take 1 tablet (5 mg total) by mouth at bedtime. 90 tablet 0   oxyCODONE-acetaminophen (PERCOCET/ROXICET) 5-325 MG tablet Take 1 tablet by mouth every 6 (six) hours as needed for severe pain. 60 tablet 0   posaconazole (NOXAFIL) 100 MG TBEC delayed-release tablet Take 3 tablets (300 mg total) by mouth daily with breakfast. 90 tablet 2   potassium chloride SA (KLOR-CON M) 20 MEQ tablet Take 1 tablet (20 mEq total) by mouth daily. 7 tablet 0   traZODone (DESYREL) 150 MG tablet Take 150 mg by  mouth at bedtime as needed for sleep.     VITAMIN D PO Take 1 capsule by mouth daily.     No current facility-administered medications for this visit.    VITAL SIGNS: There were no vitals taken for this visit. There were no vitals filed for this visit.  Estimated body mass index is 23.78 kg/m as calculated from the following:   Height as of 08/18/22: 5\' 8"  (1.727 m).   Weight as of 08/18/22: 156 lb 6.4 oz (70.9 kg).     Latest Ref Rng & Units 08/19/2022    1:53 PM 08/06/2022    3:06 PM 07/23/2022   10:46 AM  CBC  WBC 4.0 - 10.5 K/uL 8.2  7.6  8.2   Hemoglobin 13.0 - 17.0 g/dL 8.8  8.4  8.6   Hematocrit 39.0 - 52.0 % 30.2  28.4  28.5   Platelets 150 - 400 K/uL 409  321  408        Latest Ref Rng & Units 08/19/2022    1:53 PM 08/06/2022    3:06 PM 07/23/2022   10:46 AM  CMP  Glucose 70 - 99 mg/dL 161  096  045   BUN 8 - 23 mg/dL 13  13  10    Creatinine 0.61 - 1.24 mg/dL 4.09  8.11  9.14   Sodium 135 - 145 mmol/L 138  136  141   Potassium 3.5 - 5.1 mmol/L 3.4  4.3  3.1   Chloride 98 - 111 mmol/L 99  96  99   CO2 22 - 32 mmol/L 31  32  33   Calcium 8.9 - 10.3 mg/dL 9.2  9.1  9.1   Total Protein 6.5 - 8.1 g/dL 6.4  6.8  7.3   Total Bilirubin 0.3 - 1.2 mg/dL 0.4  0.3  0.4   Alkaline Phos 38 - 126 U/L 433  80  77   AST 15 - 41 U/L 29  18  12    ALT 0 - 44 U/L 32  20  18      PERFORMANCE STATUS (ECOG) : 1 - Symptomatic but completely ambulatory  Assessment NAD, weak appearing RRR Normal breathing pattern AAO x4  IMPRESSION:  [name] presents to the clinic with his daughter for follow-up. No acute distress. Continues to endorse ongoing fatigue and poor appetite. He is trying his best to be as active as possible however does not have much energy. Minimal to no desire to eat. His family continues to push fluids and foods that he likes while encouraging him to eat high protein knowing that it is important to meet some caloric requirements.   Continues to contribute his symptoms and  poor overall feelings to antifungal  as he felt somewhat better when not taking. He wishes to continue taking things one day at a time. Daughter shares he has an upcoming visit with ID this Thursday and hopeful for some adjustments allowing for improvement in his quality of life.   Occasional IV fluids offers him some increase in his energy however daughter shares last week he did not notice much of a change despite coming in 2 days in a row.   Current weight 156lbs down from 161lbs on 6/25, 164lbs on 6/6, 169lbs on 5/12, 175lbs on 4/22.   Goals are clear to continue to treat the treatable allowing [name] every opportunity the ability to live taking things one day at a time. We will continue to closely monitor and support.   PLAN:  Continue with Oxycodone 5/325 mg every 6 hours as needed for moderate to severe pain. Does not require daily.  Olanzapine mg at bedtime Valium 2-4 mg as needed for anxiety Ongoing symptom management and goals of care support. Palliative will plan to see patient back in 3-4 weeks in collaboration to other oncology appointments.  Patient knows to contact our office if needed sooner.  Patient expressed understanding and was in agreement with this plan. He also understands that He can call the clinic at any time with any questions, concerns, or complaints.   Any controlled substances utilized were prescribed in the context of palliative care. PDMP has been reviewed.   Visit consisted of counseling and education dealing with the complex and emotionally intense issues of symptom management and palliative care in the setting of serious and potentially life-threatening illness.Greater than 50%  of this time was spent counseling and coordinating care related to the above assessment and plan.   Willette Alma, AGPCNP-BC  Palliative Medicine Team/[facility]  *Please note that this is a verbal dictation therefore any spelling or grammatical errors  are due to the "[employer] One" system interpretation.

## 2022-08-24 ENCOUNTER — Other Ambulatory Visit: Payer: Self-pay

## 2022-08-24 ENCOUNTER — Encounter: Payer: Self-pay | Admitting: Nurse Practitioner

## 2022-08-24 ENCOUNTER — Inpatient Hospital Stay: Payer: Medicare HMO

## 2022-08-24 ENCOUNTER — Inpatient Hospital Stay: Payer: Medicare HMO | Admitting: Physician Assistant

## 2022-08-24 ENCOUNTER — Inpatient Hospital Stay (HOSPITAL_BASED_OUTPATIENT_CLINIC_OR_DEPARTMENT_OTHER): Payer: Medicare HMO | Admitting: Nurse Practitioner

## 2022-08-24 VITALS — BP 157/74 | HR 81 | Temp 98.0°F | Resp 16 | Wt 154.3 lb

## 2022-08-24 DIAGNOSIS — E876 Hypokalemia: Secondary | ICD-10-CM | POA: Diagnosis not present

## 2022-08-24 DIAGNOSIS — G9339 Other post infection and related fatigue syndromes: Secondary | ICD-10-CM

## 2022-08-24 DIAGNOSIS — R630 Anorexia: Secondary | ICD-10-CM

## 2022-08-24 DIAGNOSIS — R6884 Jaw pain: Secondary | ICD-10-CM | POA: Diagnosis not present

## 2022-08-24 DIAGNOSIS — R634 Abnormal weight loss: Secondary | ICD-10-CM | POA: Diagnosis not present

## 2022-08-24 DIAGNOSIS — R5383 Other fatigue: Secondary | ICD-10-CM | POA: Diagnosis not present

## 2022-08-24 DIAGNOSIS — C649 Malignant neoplasm of unspecified kidney, except renal pelvis: Secondary | ICD-10-CM

## 2022-08-24 DIAGNOSIS — Z7189 Other specified counseling: Secondary | ICD-10-CM | POA: Diagnosis not present

## 2022-08-24 DIAGNOSIS — Z515 Encounter for palliative care: Secondary | ICD-10-CM

## 2022-08-24 DIAGNOSIS — D649 Anemia, unspecified: Secondary | ICD-10-CM | POA: Diagnosis not present

## 2022-08-24 DIAGNOSIS — C641 Malignant neoplasm of right kidney, except renal pelvis: Secondary | ICD-10-CM | POA: Diagnosis not present

## 2022-08-24 DIAGNOSIS — C787 Secondary malignant neoplasm of liver and intrahepatic bile duct: Secondary | ICD-10-CM | POA: Diagnosis not present

## 2022-08-24 DIAGNOSIS — Z905 Acquired absence of kidney: Secondary | ICD-10-CM | POA: Diagnosis not present

## 2022-08-24 LAB — CMP (CANCER CENTER ONLY)
ALT: 22 U/L (ref 0–44)
AST: 19 U/L (ref 15–41)
Albumin: 3.4 g/dL — ABNORMAL LOW (ref 3.5–5.0)
Alkaline Phosphatase: 282 U/L — ABNORMAL HIGH (ref 38–126)
Anion gap: 8 (ref 5–15)
BUN: 13 mg/dL (ref 8–23)
CO2: 33 mmol/L — ABNORMAL HIGH (ref 22–32)
Calcium: 9.7 mg/dL (ref 8.9–10.3)
Chloride: 99 mmol/L (ref 98–111)
Creatinine: 0.94 mg/dL (ref 0.61–1.24)
GFR, Estimated: >60 mL/min (ref >60)
Glucose, Bld: 90 mg/dL (ref 70–99)
Potassium: 3.2 mmol/L — ABNORMAL LOW (ref 3.5–5.1)
Sodium: 140 mmol/L (ref 135–145)
Total Bilirubin: 0.3 mg/dL (ref 0.3–1.2)
Total Protein: 7.4 g/dL (ref 6.5–8.1)

## 2022-08-24 LAB — CBC WITH DIFFERENTIAL (CANCER CENTER ONLY)
Abs Immature Granulocytes: 0.04 10*3/uL (ref 0.00–0.07)
Basophils Absolute: 0 10*3/uL (ref 0.0–0.1)
Basophils Relative: 0 %
Eosinophils Absolute: 0.3 10*3/uL (ref 0.0–0.5)
Eosinophils Relative: 4 %
HCT: 30.9 % — ABNORMAL LOW (ref 39.0–52.0)
Hemoglobin: 8.9 g/dL — ABNORMAL LOW (ref 13.0–17.0)
Immature Granulocytes: 0 %
Lymphocytes Relative: 19 %
Lymphs Abs: 1.7 10*3/uL (ref 0.7–4.0)
MCH: 22.5 pg — ABNORMAL LOW (ref 26.0–34.0)
MCHC: 28.8 g/dL — ABNORMAL LOW (ref 30.0–36.0)
MCV: 78.2 fL — ABNORMAL LOW (ref 80.0–100.0)
Monocytes Absolute: 1.1 10*3/uL — ABNORMAL HIGH (ref 0.1–1.0)
Monocytes Relative: 12 %
Neutro Abs: 5.7 10*3/uL (ref 1.7–7.7)
Neutrophils Relative %: 65 %
Platelet Count: 437 10*3/uL — ABNORMAL HIGH (ref 150–400)
RBC: 3.95 MIL/uL — ABNORMAL LOW (ref 4.22–5.81)
RDW: 16.8 % — ABNORMAL HIGH (ref 11.5–15.5)
WBC Count: 8.9 10*3/uL (ref 4.0–10.5)
nRBC: 0 % (ref 0.0–0.2)

## 2022-08-24 MED ORDER — MEGESTROL ACETATE 20 MG PO TABS
20.0000 mg | ORAL_TABLET | Freq: Every day | ORAL | 1 refills | Status: DC
Start: 2022-08-24 — End: 2022-10-21

## 2022-08-24 MED ORDER — POTASSIUM CHLORIDE CRYS ER 20 MEQ PO TBCR
20.0000 meq | EXTENDED_RELEASE_TABLET | Freq: Every day | ORAL | 0 refills | Status: DC
Start: 2022-08-24 — End: 2022-09-10

## 2022-08-25 ENCOUNTER — Telehealth: Payer: Self-pay | Admitting: Internal Medicine

## 2022-08-25 ENCOUNTER — Encounter: Payer: Medicare HMO | Admitting: Dietician

## 2022-08-25 NOTE — Telephone Encounter (Signed)
Scheduled per 07/08 los, patient has been called and notified. 

## 2022-08-26 ENCOUNTER — Other Ambulatory Visit: Payer: Self-pay

## 2022-08-27 ENCOUNTER — Encounter: Payer: Self-pay | Admitting: Internal Medicine

## 2022-08-27 ENCOUNTER — Ambulatory Visit: Payer: Medicare HMO | Admitting: Internal Medicine

## 2022-08-27 ENCOUNTER — Other Ambulatory Visit: Payer: Self-pay

## 2022-08-27 VITALS — BP 117/79 | HR 73 | Resp 16 | Ht 68.0 in | Wt 154.0 lb

## 2022-08-27 DIAGNOSIS — J329 Chronic sinusitis, unspecified: Secondary | ICD-10-CM | POA: Diagnosis not present

## 2022-08-27 DIAGNOSIS — B49 Unspecified mycosis: Secondary | ICD-10-CM

## 2022-08-27 DIAGNOSIS — C649 Malignant neoplasm of unspecified kidney, except renal pelvis: Secondary | ICD-10-CM | POA: Diagnosis not present

## 2022-08-27 DIAGNOSIS — C787 Secondary malignant neoplasm of liver and intrahepatic bile duct: Secondary | ICD-10-CM

## 2022-08-27 NOTE — Progress Notes (Addendum)
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) 06/10/2022   Maxillary sinusitis 05/22/2022   Jaw pain 03/31/2022   Adrenal insufficiency (HCC) 08/23/2018   Atherosclerosis of aorta (HCC) 08/23/2018   Acquired hypothyroidism 04/19/2018   Secondary adrenal insufficiency (HCC) 04/19/2018   Acute hyponatremia 03/24/2018   Essential hypertension    Protein-calorie malnutrition, severe 03/14/2018   Generalized weakness 03/13/2018   Hyperlipidemia 11/30/2017   Kidney cancer, primary, with metastasis from kidney to other site [facility]) 09/14/2017   Recurrent umbilical hernia 08/01/2015   Incisional hernia 08/01/2015   Hypertensive heart disease without CHF    GERD (gastroesophageal reflux disease)    Chronic kidney disease    Coronary artery disease    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 02/01/2015    Patient's Medications  New Prescriptions   No medications on file  Previous Medications   ACETAMINOPHEN (TYLENOL) 500 MG TABLET    Take 1,000 mg by mouth 2 (two) times daily.   AXITINIB (INLYTA) 1 MG TABLET    Take 2 tablets (2 mg total) by mouth 2 (two) times daily.   DIAZEPAM (VALIUM) 2 MG TABLET    Take 1-2 tablets (2-4 mg total) by mouth every 8 (eight) hours as needed for anxiety.   DILTIAZEM (CARDIZEM CD) 240 MG 24 HR CAPSULE    Take 1 capsule (240 mg total) by mouth in the morning.   ELIQUIS 5 MG TABS TABLET    Take 1 tablet (5 mg total) by mouth 2 (two) times daily.   EZETIMIBE (ZETIA) 10 MG  TABLET    Take 1 tablet (10 mg total) by mouth daily.   FLUTICASONE (FLONASE) 50 MCG/ACT NASAL SPRAY    Place 2 sprays into both nostrils daily as needed for allergies.   HYDROCORTISONE (CORTEF) 20 MG TABLET    Take 1 tablet (20 mg total) by mouth every morning.   HYDROCORTISONE (CORTEF) 5 MG TABLET    Take by mouth 15 mg in am and 5 mg in pm   LEVOTHYROXINE (SYNTHROID) 150 MCG TABLET    Take 1 tablet (150 mcg total) by mouth daily before breakfast.   LORATADINE (CLARITIN) 10 MG TABLET    Take 10 mg by mouth daily as needed for allergies.   MEGESTROL (MEGACE) 20 MG TABLET    Take 1 tablet (20 mg total) by mouth daily.   MORPHINE (MS CONTIN) 15 MG 12 HR TABLET    Take 1 tablet (15 mg total) by mouth every 12 (twelve) hours.   MULTIPLE VITAMINS-MINERALS (CENTRUM SILVER 50+MEN) TABS    Take 1 tablet by mouth daily with breakfast.   MUPIROCIN OINTMENT (BACTROBAN) 2 %    Place 1 Application into the nose daily as needed (sores).   OLANZAPINE (ZYPREXA) 5 MG TABLET    Take 1 tablet (5 mg total) by mouth at bedtime.   OXYCODONE-ACETAMINOPHEN (PERCOCET/ROXICET) 5-325 MG TABLET    Take 1 tablet by mouth every 6 (six) hours as needed for severe pain.   POSACONAZOLE (  NOXAFIL) 100 MG TBEC DELAYED-RELEASE TABLET    Take 3 tablets (300 mg total) by mouth daily with breakfast.   POTASSIUM CHLORIDE SA (KLOR-CON M) 20 MEQ TABLET    Take 1 tablet (20 mEq total) by mouth daily.   TRAZODONE (DESYREL) 150 MG TABLET    Take 150 mg by mouth at bedtime as needed for sleep.   VITAMIN D PO    Take 1 capsule by mouth daily.  Modified Medications   No medications on file  Discontinued Medications   No medications on file    Subjective:  77 year old male with past medical history of (static clear-cell renal carcinoma with liver mets status post right nephrectomy December 2016, status post omental biopsy July 2019 s/p immunotherapy with Ipilizumab and nivolumab  stopped due to adrenal insufficiency and panhypopituitarism,  s/p radiotherapy completed sept 2021 with chemotherapy started with Cabometyx March 2023 but stopped in the setting of Aspergillus sinusitis ( follows Dr Arbutus Ped) CAD, lung cancer status post lobectomy 1992,  HLD, HTN, hypothyroidism presents for follow-up of 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  He was last seen on 07/22/2022.  Patient was initially started on voriconazole in April by ENT.  Did not tolerate medication and had symptoms of insomnia and night sweats.  Noted them voriconazole trough level 1 week after starting therapy but slightly above right side.  Dose adjusted but then developed hyponatremia and admitted for postop.  Subsequently transition to posaconazole 300 mg daily on 06/24/2022.He had a repeat CT done on 5/27 to assess medication impact.  Noted similar appearance erosion destruction of the posterior wall of left maxillary sinus, severe sinus wall thickening and air loculation.  Posaconazole was continued as he had only been on medication for 2 weeks at that point. Today 08/27/22: Patient thought appointment was at 10:00 as such was about an hour late to visit. Denied acute complaints to me.  He does state that he has had increased weakness and decreased appetite for about 3 weeks. Pt states he could hardly put his clothes on. Pt has lost 8 pounds since June.  He thought that it may have been related to his posaconazole.  He stopped taking posaconazole for about 5 days but did not see significant improvement in symptoms.  Restarted posaconazole about 3 days ago.  Denies fever, chills, dizziness, headache, syncopal episodes, acute complaints   He was last seen by medical oncology on 08/24/2022.  He was switched to a lower dose of axitinib due to drug-drug interaction with posaconazole.  At that visit patient had noted fatigue, weakness, weight loss, poor appetite which patient attributed to antifungal treatment.  He is also followed by palliative care.  Megace was prescribed at  that visit.  Which patient has not picked up.  Recent CT abdomen showed pancreatic mass as well as peripancreatic retroperioneal LAD as well as liver lesions. Review of Systems: Review of Systems  All other systems reviewed and are negative.   Past Medical History:  Diagnosis Date   Arthritis    Chronic kidney disease    only has one kidney    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 02/01/2015   Coronary artery disease    followed by Dr.Tilley   Frequent PVCs    GERD (gastroesophageal reflux disease)    Heart murmur    never has caused any problems   Hx of cancer of lung 1980's   Hyperlipidemia    Hypertension    Hypothyroidism  Incisional hernia 08/01/2015   Lung cancer (HCC) 1993   Lung metastases 2019   Pneumonia    x several   Recurrent umbilical hernia 08/01/2015    Social History   Tobacco Use   Smoking status: Former    Current packs/day: 0.00    Average packs/day: 1 pack/day for 20.0 years (20.0 ttl pk-yrs)    Types: Cigarettes    Start date: 01/29/1968    Quit date: 01/29/1988    Years since quitting: 34.6   Smokeless tobacco: Never  Vaping Use   Vaping status: Never Used  Substance Use Topics   Alcohol use: Not Currently    Comment: rare   Drug use: No    Family History  Problem Relation Age of Onset   Heart attack Father     Allergies  Allergen Reactions   Diphenhydramine Other (See Comments)    Severe restless legs    Health Maintenance  Topic Date Due   COVID-19 Vaccine (1) Never done   Pneumonia Vaccine 74+ Years old (1 of 2 - PCV) Never done   Hepatitis C Screening  Never done   DTaP/Tdap/Td (1 - Tdap) Never done   Zoster Vaccines- Shingrix (1 of 2) Never done   INFLUENZA VACCINE  09/17/2022   HPV VACCINES  Aged Out    Objective:  Vitals:   08/27/22 1028  BP: 117/79  Pulse: 73  Resp: 16  SpO2: 98%  Weight: 154 lb (69.9 kg)  Height: 5\' 8"  (1.727 m)   Body mass index is 23.42 kg/m.  Physical  Exam Constitutional:      General: He is not in acute distress.    Appearance: He is normal weight. He is not toxic-appearing.  HENT:     Head: Normocephalic and atraumatic.     Right Ear: External ear normal.     Left Ear: External ear normal.     Nose: No congestion or rhinorrhea.     Mouth/Throat:     Mouth: Mucous membranes are moist.     Pharynx: Oropharynx is clear.  Eyes:     Extraocular Movements: Extraocular movements intact.     Conjunctiva/sclera: Conjunctivae normal.     Pupils: Pupils are equal, round, and reactive to light.  Cardiovascular:     Rate and Rhythm: Normal rate and regular rhythm.     Heart sounds: No murmur heard.    No friction rub. No gallop.  Pulmonary:     Effort: Pulmonary effort is normal.     Breath sounds: Normal breath sounds.  Abdominal:     General: Abdomen is flat. Bowel sounds are normal.     Palpations: Abdomen is soft.  Musculoskeletal:        General: No swelling. Normal range of motion.     Cervical back: Normal range of motion and neck supple.  Skin:    General: Skin is warm and dry.  Neurological:     General: No focal deficit present.     Mental Status: He is oriented to person, place, and time.  Psychiatric:        Mood and Affect: Mood normal.     Lab Results Lab Results  Component Value Date   WBC 8.9 08/24/2022   HGB 8.9 (L) 08/24/2022   HCT 30.9 (L) 08/24/2022   MCV 78.2 (L) 08/24/2022   PLT 437 (H) 08/24/2022    Lab Results  Component Value Date   CREATININE 0.94 08/24/2022   BUN 13 08/24/2022   NA  140 08/24/2022   K 3.2 (L) 08/24/2022   CL 99 08/24/2022   CO2 33 (H) 08/24/2022    Lab Results  Component Value Date   ALT 22 08/24/2022   AST 19 08/24/2022   ALKPHOS 282 (H) 08/24/2022   BILITOT 0.3 08/24/2022    Lab Results  Component Value Date   CHOL 153 03/26/2022   HDL 45 03/26/2022   LDLCALC 86 03/26/2022   TRIG 123 03/26/2022   CHOLHDL 3.4 03/26/2022   No results found for: "LABRPR",  "RPRTITER" No results found for: "HIV1RNAQUANT", "HIV1RNAVL", "CD4TABS"   Problem List Items Addressed This Visit   None  Assessment/Plan 77 year old male with stage IV clear-cell renal carcinoma initially diagnosed 2016 localized T1 disease with mets to liver and omentum in 2019 on lower dose ofaxitinib presents for: #Invasive fungal sinusitis/Primary on posaconazole - Patient was initially started on voriconazole by ENT back in April.  This was discontinued due to admission for hyponatremia and patient transition to posaconazole 300 mg daily. - He has been on pause of since 06/24/2022.  Now has worsening weakness and decreased appetite x 3 weeks.  The symptoms did not significantly improve with stopping posa for about 5 days which he has not restarted. -Saw pt with pharmacy today and reviewed medication interactions.  He is not taking trazodone for a while. Plan: -Pt is clinically stable. , EKG with qtc 420. Labs today -Continue poza at 300 mg daily(therapeutic level on 5/22 1.41). The drug-drug interaction of axitinib and poza could be attributing to symptoms. Dose of axitinib was lowered about a month a ago.   -Agree with megace, may need to go up on dosing.(HE has not started megace yet).  Ultimately, I think the underlying issue for pt's weakness and loss appetite is likely malignancy +/- posaconzole + axitinib interaction For  treatment of invasive aspergillosis of the sinuses, continue oral antifungal therapy for at least 4-6 months to prevent recurrence of the disease  -F/U with pt on 8/26 to review symptoms and  review repeat CT(last on on 5/27)    [facility] 08/27/2022, 10:50 AM   I have personally spent 110 minutes involved in face-to-face and non-face-to-face activities for this patient on the day of the visit. Professional time spent includes the following activities: Preparing to see the patient (review of tests), Obtaining  and/or reviewing separately obtained history (admission/discharge record), Performing a medically appropriate examination and/or evaluation , Ordering medications/tests/procedures, referring and communicating with other health care professionals, Documenting clinical information in the EMR, Independently interpreting results (not separately reported), Communicating results to the patient/family/caregiver, Counseling and educating the patient/family/caregiver and Care coordination (not separately reported).

## 2022-08-27 NOTE — Progress Notes (Deleted)
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) 06/10/2022   Maxillary sinusitis 05/22/2022   Jaw pain 03/31/2022   Adrenal insufficiency (HCC) 08/23/2018   Atherosclerosis of aorta (HCC) 08/23/2018   Acquired hypothyroidism 04/19/2018   Secondary adrenal insufficiency (HCC) 04/19/2018   Acute hyponatremia 03/24/2018   Essential hypertension    Protein-calorie malnutrition, severe 03/14/2018   Generalized weakness 03/13/2018   Hyperlipidemia 11/30/2017   Kidney cancer, primary, with metastasis from kidney to other site [facility]) 09/14/2017   Recurrent umbilical hernia 08/01/2015   Incisional hernia 08/01/2015   Hypertensive heart disease without CHF    GERD (gastroesophageal reflux disease)    Chronic kidney disease    Coronary artery disease    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 02/01/2015    Patient's Medications  New Prescriptions   No medications on file  Previous Medications   ACETAMINOPHEN (TYLENOL) 500 MG TABLET    Take 1,000 mg by mouth 2 (two) times daily.   AXITINIB (INLYTA) 1 MG TABLET    Take 2 tablets (2 mg total) by mouth 2 (two) times daily.   DIAZEPAM (VALIUM) 2 MG TABLET    Take 1-2 tablets (2-4 mg total) by mouth every 8 (eight) hours as needed for anxiety.   DILTIAZEM (CARDIZEM CD) 240 MG 24 HR CAPSULE    Take 1 capsule (240 mg total) by mouth in the morning.   ELIQUIS 5 MG TABS TABLET    Take 1 tablet (5 mg total) by mouth 2 (two) times daily.   EZETIMIBE (ZETIA) 10 MG  TABLET    Take 1 tablet (10 mg total) by mouth daily.   FLUTICASONE (FLONASE) 50 MCG/ACT NASAL SPRAY    Place 2 sprays into both nostrils daily as needed for allergies.   HYDROCORTISONE (CORTEF) 20 MG TABLET    Take 1 tablet (20 mg total) by mouth every morning.   HYDROCORTISONE (CORTEF) 5 MG TABLET    Take by mouth 15 mg in am and 5 mg in pm   LEVOTHYROXINE (SYNTHROID) 150 MCG TABLET    Take 1 tablet (150 mcg total) by mouth daily before breakfast.   LORATADINE (CLARITIN) 10 MG TABLET    Take 10 mg by mouth daily as needed for allergies.   MEGESTROL (MEGACE) 20 MG TABLET    Take 1 tablet (20 mg total) by mouth daily.   MORPHINE (MS CONTIN) 15 MG 12 HR TABLET    Take 1 tablet (15 mg total) by mouth every 12 (twelve) hours.   MULTIPLE VITAMINS-MINERALS (CENTRUM SILVER 50+MEN) TABS    Take 1 tablet by mouth daily with breakfast.   MUPIROCIN OINTMENT (BACTROBAN) 2 %    Place 1 Application into the nose daily as needed (sores).   OLANZAPINE (ZYPREXA) 5 MG TABLET    Take 1 tablet (5 mg total) by mouth at bedtime.   OXYCODONE-ACETAMINOPHEN (PERCOCET/ROXICET) 5-325 MG TABLET    Take 1 tablet by mouth every 6 (six) hours as needed for severe pain.   POSACONAZOLE (  NOXAFIL) 100 MG TBEC DELAYED-RELEASE TABLET    Take 3 tablets (300 mg total) by mouth daily with breakfast.   POTASSIUM CHLORIDE SA (KLOR-CON M) 20 MEQ TABLET    Take 1 tablet (20 mEq total) by mouth daily.   TRAZODONE (DESYREL) 150 MG TABLET    Take 150 mg by mouth at bedtime as needed for sleep.   VITAMIN D PO    Take 1 capsule by mouth daily.  Modified Medications   No medications on file  Discontinued Medications   No medications on file    Subjective: ***   Review of Systems: ROS  Past Medical History:  Diagnosis Date   Arthritis    Chronic kidney disease    only has one kidney    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 02/01/2015   Coronary artery disease    followed by Dr.Tilley   Frequent PVCs     GERD (gastroesophageal reflux disease)    Heart murmur    never has caused any problems   Hx of cancer of lung 1980's   Hyperlipidemia    Hypertension    Hypothyroidism    Incisional hernia 08/01/2015   Lung cancer (HCC) 1993   Lung metastases 2019   Pneumonia    x several   Recurrent umbilical hernia 08/01/2015    Social History   Tobacco Use   Smoking status: Former    Current packs/day: 0.00    Average packs/day: 1 pack/day for 20.0 years (20.0 ttl pk-yrs)    Types: Cigarettes    Start date: 01/29/1968    Quit date: 01/29/1988    Years since quitting: 34.6   Smokeless tobacco: Never  Vaping Use   Vaping status: Never Used  Substance Use Topics   Alcohol use: Not Currently    Comment: rare   Drug use: No    Family History  Problem Relation Age of Onset   Heart attack Father     Allergies  Allergen Reactions   Diphenhydramine Other (See Comments)    Severe restless legs    Health Maintenance  Topic Date Due   COVID-19 Vaccine (1) Never done   Pneumonia Vaccine 58+ Years old (1 of 2 - PCV) Never done   Hepatitis C Screening  Never done   DTaP/Tdap/Td (1 - Tdap) Never done   Zoster Vaccines- Shingrix (1 of 2) Never done   INFLUENZA VACCINE  09/17/2022   HPV VACCINES  Aged Out    Objective:  There were no vitals filed for this visit. There is no height or weight on file to calculate BMI.  Physical Exam  Lab Results Lab Results  Component Value Date   WBC 8.9 08/24/2022   HGB 8.9 (L) 08/24/2022   HCT 30.9 (L) 08/24/2022   MCV 78.2 (L) 08/24/2022   PLT 437 (H) 08/24/2022    Lab Results  Component Value Date   CREATININE 0.94 08/24/2022   BUN 13 08/24/2022   NA 140 08/24/2022   K 3.2 (L) 08/24/2022   CL 99 08/24/2022   CO2 33 (H) 08/24/2022    Lab Results  Component Value Date   ALT 22 08/24/2022   AST 19 08/24/2022   ALKPHOS 282 (H) 08/24/2022   BILITOT 0.3 08/24/2022    Lab Results  Component Value Date   CHOL 153 03/26/2022    HDL 45 03/26/2022   LDLCALC 86 03/26/2022   TRIG 123 03/26/2022   CHOLHDL 3.4 03/26/2022   No results  found for: "LABRPR", "RPRTITER" No results found for: "HIV1RNAQUANT", "HIV1RNAVL", "CD4TABS"   Problem List Items Addressed This Visit   None  Assessment/Plan Invasive fungal sinusitis/Primary For the treatment of invasive aspergillosis of the sinuses, it is recommended to continue oral antifungal therapy for at least 4-6 months to prevent recurrence of the disease.  SE with meds/interaction -5/27 CT u. Decrease dose of inlyta z1 month.nchanged Increase megazce, decrease trazodone dose Weakness worse 2 weeks. Pt states he could hardly put his clothes on. Pt has lost 8 pounds ince June, dcrease appetice Has nokt picked up meace.  Last dose pozaconazole was : yesterday and today. Not hav eit on Monday.  Ha snot taken trazadone for a while.  Danelle Earthly, MD [facility] 08/27/2022, 5:43 AM

## 2022-08-28 LAB — CBC WITH DIFFERENTIAL/PLATELET
Absolute Monocytes: 996 cells/uL — ABNORMAL HIGH (ref 200–950)
Basophils Absolute: 32 cells/uL (ref 0–200)
Basophils Relative: 0.4 %
Eosinophils Absolute: 340 cells/uL (ref 15–500)
Eosinophils Relative: 4.2 %
HCT: 32.2 % — ABNORMAL LOW (ref 38.5–50.0)
Hemoglobin: 9.3 g/dL — ABNORMAL LOW (ref 13.2–17.1)
Lymphs Abs: 1482 cells/uL (ref 850–3900)
MCH: 21.9 pg — ABNORMAL LOW (ref 27.0–33.0)
MCHC: 28.9 g/dL — ABNORMAL LOW (ref 32.0–36.0)
MCV: 75.9 fL — ABNORMAL LOW (ref 80.0–100.0)
MPV: 10.2 fL (ref 7.5–12.5)
Monocytes Relative: 12.3 %
Neutro Abs: 5249 cells/uL (ref 1500–7800)
Neutrophils Relative %: 64.8 %
Platelets: 508 10*3/uL — ABNORMAL HIGH (ref 140–400)
RBC: 4.24 10*6/uL (ref 4.20–5.80)
RDW: 16.8 % — ABNORMAL HIGH (ref 11.0–15.0)
Total Lymphocyte: 18.3 %
WBC: 8.1 10*3/uL (ref 3.8–10.8)

## 2022-08-28 LAB — COMPLETE METABOLIC PANEL WITH GFR
AG Ratio: 1.3 (calc) (ref 1.0–2.5)
ALT: 20 U/L (ref 9–46)
AST: 16 U/L (ref 10–35)
Albumin: 3.6 g/dL (ref 3.6–5.1)
Alkaline phosphatase (APISO): 260 U/L — ABNORMAL HIGH (ref 35–144)
BUN: 10 mg/dL (ref 7–25)
CO2: 32 mmol/L (ref 20–32)
Calcium: 9 mg/dL (ref 8.6–10.3)
Chloride: 97 mmol/L — ABNORMAL LOW (ref 98–110)
Creat: 0.9 mg/dL (ref 0.70–1.28)
Globulin: 2.8 g/dL (calc) (ref 1.9–3.7)
Glucose, Bld: 105 mg/dL — ABNORMAL HIGH (ref 65–99)
Potassium: 3.5 mmol/L (ref 3.5–5.3)
Sodium: 140 mmol/L (ref 135–146)
Total Bilirubin: 0.4 mg/dL (ref 0.2–1.2)
Total Protein: 6.4 g/dL (ref 6.1–8.1)
eGFR: 87 mL/min/{1.73_m2} (ref >=60)

## 2022-08-28 LAB — C-REACTIVE PROTEIN: CRP: 149 mg/L — ABNORMAL HIGH (ref <8.0)

## 2022-08-28 LAB — SEDIMENTATION RATE: Sed Rate: >130 mm/h — ABNORMAL HIGH (ref 0–20)

## 2022-08-31 ENCOUNTER — Other Ambulatory Visit: Payer: Self-pay | Admitting: Pharmacist Clinician (PhC)/ Clinical Pharmacy Specialist

## 2022-08-31 MED ORDER — APIXABAN 2.5 MG PO TABS: 2.5000 mg | ORAL_TABLET | Freq: Two times a day (BID) | ORAL | 1 refills | Status: DC

## 2022-08-31 NOTE — Telephone Encounter (Signed)
77 M, SCr 0.9, wt 69.9 kg LOV 2.24 Croitoru  Patient also taking posaconazole 300 mg daily, a strong CYP3A4 and p-glycoprotein inhibitor.  Up To Date recommends to cut Eliquis from 5 mg to 2.5 mg twice daily when coadministered with posaconazole

## 2022-09-01 ENCOUNTER — Other Ambulatory Visit (HOSPITAL_COMMUNITY): Payer: Self-pay

## 2022-09-01 ENCOUNTER — Other Ambulatory Visit: Payer: Self-pay | Admitting: Internal Medicine

## 2022-09-01 ENCOUNTER — Other Ambulatory Visit: Payer: Self-pay

## 2022-09-01 DIAGNOSIS — C649 Malignant neoplasm of unspecified kidney, except renal pelvis: Secondary | ICD-10-CM

## 2022-09-01 MED ORDER — AXITINIB 1 MG PO TABS
2.0000 mg | ORAL_TABLET | Freq: Two times a day (BID) | ORAL | 0 refills | Status: DC
Start: 2022-09-01 — End: 2022-09-28
  Filled 2022-09-01: qty 120, 30d supply, fill #0

## 2022-09-02 ENCOUNTER — Inpatient Hospital Stay: Payer: Medicare HMO | Admitting: Dietician

## 2022-09-02 ENCOUNTER — Other Ambulatory Visit (HOSPITAL_COMMUNITY): Payer: Self-pay

## 2022-09-02 NOTE — Progress Notes (Signed)
Patient did not show for scheduled nutrition appointment. Will have scheduling contact patient to offer another appointment.

## 2022-09-03 ENCOUNTER — Telehealth: Payer: Self-pay | Admitting: Dietician

## 2022-09-03 ENCOUNTER — Ambulatory Visit (HOSPITAL_COMMUNITY)
Admission: RE | Admit: 2022-09-03 | Discharge: 2022-09-03 | Disposition: A | Discharge status: Home / Self Care | Payer: Medicare HMO | Source: Ambulatory Visit | Attending: Internal Medicine | Admitting: Internal Medicine

## 2022-09-03 ENCOUNTER — Other Ambulatory Visit: Payer: Self-pay

## 2022-09-03 DIAGNOSIS — B49 Unspecified mycosis: Secondary | ICD-10-CM | POA: Insufficient documentation

## 2022-09-03 DIAGNOSIS — J329 Chronic sinusitis, unspecified: Secondary | ICD-10-CM | POA: Diagnosis not present

## 2022-09-03 DIAGNOSIS — J3489 Other specified disorders of nose and nasal sinuses: Secondary | ICD-10-CM | POA: Diagnosis not present

## 2022-09-03 MED ORDER — IOHEXOL 350 MG/ML SOLN
70.0000 mL | Freq: Once | INTRAVENOUS | Status: AC | PRN
  Administered 2022-09-03: 70 mL via INTRAVENOUS

## 2022-09-08 ENCOUNTER — Other Ambulatory Visit (INDEPENDENT_AMBULATORY_CARE_PROVIDER_SITE_OTHER): Payer: Medicare HMO

## 2022-09-08 DIAGNOSIS — C649 Malignant neoplasm of unspecified kidney, except renal pelvis: Secondary | ICD-10-CM

## 2022-09-08 DIAGNOSIS — E039 Hypothyroidism, unspecified: Secondary | ICD-10-CM | POA: Diagnosis not present

## 2022-09-08 LAB — TSH: TSH: 1.85 u[IU]/mL (ref 0.35–5.50)

## 2022-09-08 LAB — T4, FREE: Free T4: 1.06 ng/dL (ref 0.60–1.60)

## 2022-09-08 NOTE — Addendum Note (Signed)
Addended by: Barnet Glasgow on: 09/08/2022 09:57 AM   Modules accepted: Orders

## 2022-09-08 NOTE — Progress Notes (Unsigned)
Palliative Medicine [facility]  Telephone:(336) [phone] Fax:(336) [phone]   Name: [name] Date: 09/08/2022 MRN: [id number]  DOB: [birth year]  Patient Care Team: [name], MD as PCP - General (Family Medicine) Thurmon Fair, MD as PCP - Cardiology (Cardiology) Karie Soda, MD as Consulting Physician (General Surgery) Crist Fat, MD as Consulting Physician (Urology)    INTERVAL HISTORY: [name] is a 77 y.o. 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 Palliative ask to see for symptom and pain management and goals of care.   SOCIAL HISTORY:    [name] reports that he quit smoking about 34 years ago. His smoking use included cigarettes. He started smoking about 54 years ago. He has a 20 pack-year smoking history. He has never used smokeless tobacco. He reports that he does not currently use alcohol. He reports that he does not use drugs.  ADVANCE DIRECTIVES:  None on file CODE STATUS: Full code  PAST MEDICAL HISTORY: Past Medical History:  Diagnosis Date   Arthritis    Chronic kidney disease    only has one kidney    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 02/01/2015   Coronary artery disease    followed by Dr.Tilley   Frequent PVCs    GERD (gastroesophageal reflux disease)    Heart murmur    never has caused any problems   Hx of cancer of lung 1980's   Hyperlipidemia    Hypertension    Hypothyroidism    Incisional hernia 08/01/2015   Lung cancer (HCC) 1993   Lung metastases 2019   Pneumonia    x several   Recurrent umbilical hernia 08/01/2015    ALLERGIES:  is allergic to diphenhydramine.  MEDICATIONS:  Current Outpatient Medications  Medication Sig Dispense Refill   acetaminophen (TYLENOL) 500 MG tablet Take 1,000 mg by mouth 2 (two) times daily.      apixaban (ELIQUIS) 2.5 MG TABS tablet Take 1 tablet (2.5 mg total) by mouth 2 (two) times daily. 180 tablet 1   axitinib (INLYTA) 1 MG tablet Take 2 tablets (2 mg total) by mouth 2 (two) times daily. 120 tablet 0   diazepam (VALIUM) 2 MG tablet Take 1-2 tablets (2-4 mg total) by mouth every 8 (eight) hours as needed for anxiety. (Patient not taking: Reported on 08/27/2022) 60 tablet 0   diltiazem (CARDIZEM CD) 240 MG 24 hr capsule Take 1 capsule (240 mg total) by mouth in the morning. 90 capsule 3   ezetimibe (ZETIA) 10 MG tablet Take 1 tablet (10 mg total) by mouth daily. (Patient taking differently: Take 10 mg by mouth 2 (two) times a week.) 90 tablet 3   fluticasone (FLONASE) 50 MCG/ACT nasal spray Place 2 sprays into both nostrils daily as needed for allergies.     hydrocortisone (CORTEF) 20 MG tablet Take 1 tablet (20 mg total) by mouth every morning. 90 tablet 0   hydrocortisone (CORTEF) 5 MG tablet Take by mouth 15 mg in am and 5 mg in pm 400 tablet 3   levothyroxine (SYNTHROID) 150 MCG tablet Take 1 tablet (150 mcg total) by mouth daily before breakfast. 90 tablet 3   loratadine (CLARITIN) 10 MG tablet Take 10 mg by mouth daily as needed for allergies.     megestrol (MEGACE) 20 MG tablet Take 1 tablet (20 mg total) by mouth daily.  30 tablet 1   morphine (MS CONTIN) 15 MG 12 hr tablet Take 1 tablet (15 mg total) by mouth every 12 (twelve) hours. (Patient not taking: Reported on 08/27/2022) 30 tablet 0   Multiple Vitamins-Minerals (CENTRUM SILVER 50+MEN) TABS Take 1 tablet by mouth daily with breakfast.     mupirocin ointment (BACTROBAN) 2 % Place 1 Application into the nose daily as needed (sores).     OLANZapine (ZYPREXA) 5 MG tablet Take 1 tablet (5 mg total) by mouth at bedtime. 90 tablet 0   oxyCODONE-acetaminophen (PERCOCET/ROXICET) 5-325 MG tablet Take 1 tablet by mouth every 6 (six) hours as needed for severe pain. (Patient not taking: Reported on 08/27/2022) 60 tablet 0   posaconazole  (NOXAFIL) 100 MG TBEC delayed-release tablet Take 3 tablets (300 mg total) by mouth daily with breakfast. 90 tablet 2   potassium chloride SA (KLOR-CON M) 20 MEQ tablet Take 1 tablet (20 mEq total) by mouth daily. 5 tablet 0   traZODone (DESYREL) 150 MG tablet Take 150 mg by mouth at bedtime as needed for sleep.     VITAMIN D PO Take 1 capsule by mouth daily.     No current facility-administered medications for this visit.    VITAL SIGNS: There were no vitals taken for this visit. There were no vitals filed for this visit.  Estimated body mass index is 23.42 kg/m as calculated from the following:   Height as of 08/27/22: 5\' 8"  (1.727 m).   Weight as of 08/27/22: 154 lb (69.9 kg).     Latest Ref Rng & Units 08/27/2022   11:16 AM 08/24/2022    8:36 AM 08/19/2022    1:53 PM  CBC  WBC 3.8 - 10.8 Thousand/uL 8.1  8.9  8.2   Hemoglobin 13.2 - 17.1 g/dL 9.3  8.9  8.8   Hematocrit 38.5 - 50.0 % 32.2  30.9  30.2   Platelets 140 - 400 Thousand/uL 508  437  409        Latest Ref Rng & Units 08/27/2022   11:16 AM 08/24/2022    8:36 AM 08/19/2022    1:53 PM  CMP  Glucose 65 - 99 mg/dL 657  90  846   BUN 7 - 25 mg/dL 10  13  13    Creatinine 0.70 - 1.28 mg/dL 9.62  9.52  8.41   Sodium 135 - 146 mmol/L 140  140  138   Potassium 3.5 - 5.3 mmol/L 3.5  3.2  3.4   Chloride 98 - 110 mmol/L 97  99  99   CO2 20 - 32 mmol/L 32  33  31   Calcium 8.6 - 10.3 mg/dL 9.0  9.7  9.2   Total Protein 6.1 - 8.1 g/dL 6.4  7.4  6.4   Total Bilirubin 0.2 - 1.2 mg/dL 0.4  0.3  0.4   Alkaline Phos 38 - 126 U/L  282  433   AST 10 - 35 U/L 16  19  29    ALT 9 - 46 U/L 20  22  32      PERFORMANCE STATUS (ECOG) : 1 - Symptomatic but completely ambulatory  Assessment NAD, weak appearing RRR Normal breathing pattern AAO x4  IMPRESSION:    Goals are clear to continue to treat the treatable allowing [name] every opportunity the ability to live taking things one day at a time. We will continue to closely monitor and  support.   PLAN:  Continue with Oxycodone 5/325 mg  every 6 hours as needed for moderate to severe pain. Does not require daily.  Olanzapine mg at bedtime Valium 2-4 mg as needed for anxiety Ongoing symptom management and goals of care support. Palliative will plan to see patient back in 3-4 weeks in collaboration to other oncology appointments.  Patient knows to contact our office if needed sooner.  Patient expressed understanding and was in agreement with this plan. He also understands that He can call the clinic at any time with any questions, concerns, or complaints.   Any controlled substances utilized were prescribed in the context of palliative care. PDMP has been reviewed.   Visit consisted of counseling and education dealing with the complex and emotionally intense issues of symptom management and palliative care in the setting of serious and potentially life-threatening illness.Greater than 50%  of this time was spent counseling and coordinating care related to the above assessment and plan.   Willette Alma, AGPCNP-BC  Palliative Medicine Team/[facility]  *Please note that this is a verbal dictation therefore any spelling or grammatical errors are due to the "Dragon Medical One" system interpretation.

## 2022-09-10 ENCOUNTER — Telehealth: Payer: Self-pay

## 2022-09-10 ENCOUNTER — Inpatient Hospital Stay (HOSPITAL_BASED_OUTPATIENT_CLINIC_OR_DEPARTMENT_OTHER): Payer: Medicare HMO | Admitting: Nurse Practitioner

## 2022-09-10 ENCOUNTER — Inpatient Hospital Stay: Payer: Medicare HMO

## 2022-09-10 ENCOUNTER — Encounter: Payer: Self-pay | Admitting: Nurse Practitioner

## 2022-09-10 ENCOUNTER — Other Ambulatory Visit: Payer: Self-pay | Admitting: Physician Assistant

## 2022-09-10 ENCOUNTER — Other Ambulatory Visit: Payer: Self-pay

## 2022-09-10 ENCOUNTER — Other Ambulatory Visit: Payer: Self-pay | Admitting: Internal Medicine

## 2022-09-10 ENCOUNTER — Inpatient Hospital Stay: Payer: Medicare HMO | Admitting: Internal Medicine

## 2022-09-10 ENCOUNTER — Telehealth: Payer: Self-pay | Admitting: Internal Medicine

## 2022-09-10 VITALS — BP 147/77 | HR 71 | Temp 98.4°F | Resp 16 | Ht 68.0 in | Wt 155.4 lb

## 2022-09-10 DIAGNOSIS — Z905 Acquired absence of kidney: Secondary | ICD-10-CM | POA: Diagnosis not present

## 2022-09-10 DIAGNOSIS — C641 Malignant neoplasm of right kidney, except renal pelvis: Secondary | ICD-10-CM

## 2022-09-10 DIAGNOSIS — C649 Malignant neoplasm of unspecified kidney, except renal pelvis: Secondary | ICD-10-CM

## 2022-09-10 DIAGNOSIS — D649 Anemia, unspecified: Secondary | ICD-10-CM | POA: Diagnosis not present

## 2022-09-10 DIAGNOSIS — R5383 Other fatigue: Secondary | ICD-10-CM

## 2022-09-10 DIAGNOSIS — R6884 Jaw pain: Secondary | ICD-10-CM | POA: Diagnosis not present

## 2022-09-10 DIAGNOSIS — Z515 Encounter for palliative care: Secondary | ICD-10-CM

## 2022-09-10 DIAGNOSIS — R630 Anorexia: Secondary | ICD-10-CM | POA: Diagnosis not present

## 2022-09-10 DIAGNOSIS — F419 Anxiety disorder, unspecified: Secondary | ICD-10-CM | POA: Diagnosis not present

## 2022-09-10 DIAGNOSIS — E876 Hypokalemia: Secondary | ICD-10-CM

## 2022-09-10 DIAGNOSIS — Z7189 Other specified counseling: Secondary | ICD-10-CM

## 2022-09-10 DIAGNOSIS — R634 Abnormal weight loss: Secondary | ICD-10-CM | POA: Diagnosis not present

## 2022-09-10 DIAGNOSIS — C787 Secondary malignant neoplasm of liver and intrahepatic bile duct: Secondary | ICD-10-CM | POA: Diagnosis not present

## 2022-09-10 DIAGNOSIS — E039 Hypothyroidism, unspecified: Secondary | ICD-10-CM

## 2022-09-10 LAB — CBC WITH DIFFERENTIAL (CANCER CENTER ONLY)
Abs Immature Granulocytes: 0.03 10*3/uL (ref 0.00–0.07)
Basophils Absolute: 0 10*3/uL (ref 0.0–0.1)
Basophils Relative: 1 %
Eosinophils Absolute: 0.1 10*3/uL (ref 0.0–0.5)
Eosinophils Relative: 1 %
HCT: 31.2 % — ABNORMAL LOW (ref 39.0–52.0)
Hemoglobin: 8.9 g/dL — ABNORMAL LOW (ref 13.0–17.0)
Immature Granulocytes: 0 %
Lymphocytes Relative: 20 %
Lymphs Abs: 1.7 10*3/uL (ref 0.7–4.0)
MCH: 21.6 pg — ABNORMAL LOW (ref 26.0–34.0)
MCHC: 28.5 g/dL — ABNORMAL LOW (ref 30.0–36.0)
MCV: 75.7 fL — ABNORMAL LOW (ref 80.0–100.0)
Monocytes Absolute: 0.8 10*3/uL (ref 0.1–1.0)
Monocytes Relative: 10 %
Neutro Abs: 5.9 10*3/uL (ref 1.7–7.7)
Neutrophils Relative %: 68 %
Platelet Count: 412 10*3/uL — ABNORMAL HIGH (ref 150–400)
RBC: 4.12 MIL/uL — ABNORMAL LOW (ref 4.22–5.81)
RDW: 17.8 % — ABNORMAL HIGH (ref 11.5–15.5)
WBC Count: 8.6 10*3/uL (ref 4.0–10.5)
nRBC: 0 % (ref 0.0–0.2)

## 2022-09-10 LAB — CMP (CANCER CENTER ONLY)
ALT: 36 U/L (ref 0–44)
AST: 21 U/L (ref 15–41)
Albumin: 3.5 g/dL (ref 3.5–5.0)
Alkaline Phosphatase: 96 U/L (ref 38–126)
Anion gap: 10 (ref 5–15)
BUN: 13 mg/dL (ref 8–23)
CO2: 31 mmol/L (ref 22–32)
Calcium: 9.2 mg/dL (ref 8.9–10.3)
Chloride: 99 mmol/L (ref 98–111)
Creatinine: 0.89 mg/dL (ref 0.61–1.24)
GFR, Estimated: >60 mL/min (ref >60)
Glucose, Bld: 111 mg/dL — ABNORMAL HIGH (ref 70–99)
Potassium: 2.9 mmol/L — ABNORMAL LOW (ref 3.5–5.1)
Sodium: 140 mmol/L (ref 135–145)
Total Bilirubin: 0.4 mg/dL (ref 0.3–1.2)
Total Protein: 6.9 g/dL (ref 6.5–8.1)

## 2022-09-10 LAB — IRON AND IRON BINDING CAPACITY (CC-WL,HP ONLY)
Iron: 17 ug/dL — ABNORMAL LOW (ref 45–182)
Saturation Ratios: 8 % — ABNORMAL LOW (ref 17.9–39.5)
TIBC: 204 ug/dL — ABNORMAL LOW (ref 250–450)
UIBC: 187 ug/dL (ref 117–376)

## 2022-09-10 LAB — VITAMIN B12: Vitamin B-12: 440 pg/mL (ref 180–914)

## 2022-09-10 LAB — FOLATE: Folate: 12.8 ng/mL (ref >5.9)

## 2022-09-10 LAB — FERRITIN: Ferritin: 1136 ng/mL — ABNORMAL HIGH (ref 24–336)

## 2022-09-10 MED ORDER — POTASSIUM CHLORIDE CRYS ER 20 MEQ PO TBCR
20.0000 meq | EXTENDED_RELEASE_TABLET | Freq: Every day | ORAL | 0 refills | Status: DC
Start: 2022-09-10 — End: 2022-11-04

## 2022-09-10 NOTE — Progress Notes (Signed)
[facility] Telephone:(336) [phone]   Fax:(336) [phone]  OFFICE PROGRESS NOTE  [name], MD [address] [place] [address]  DIAGNOSIS: Stage IV clear-cell renal cell carcinoma diagnosed initially in 2016 as localized T1 a disease with evidence of metastatic disease to the liver and omentum in 2019.  PRIOR THERAPY: 1) status post radical right renal nephrectomy under the care of Dr. Marlou Porch on February 01, 2015 and the final pathology revealed 3.3 cm clear renal cell carcinoma with Fuhrman grade 3. 2) status post omental biopsy in July 2019 that confirmed the presence of recurrent metastatic renal cell carcinoma. 3) status post treatment with immunotherapy with ipilimumab 1 Mg/KG and nivolumab 3 mg/KG every 3 weeks started 09/21/2017 status post 4 cycles and the patient did not receive any additional immunotherapy secondary to adrenal insufficiency and panhypopituitarism 4) status post radiotherapy to the abdominal lymph node completed November 08, 2019. 5) Cabometyx initially started at 40 mg p.o. daily in March 2023 and reduced to 20 mg p.o. daily in April 2023.  This was discontinued in June 2024 secondary to disease progression.  CURRENT THERAPY: Axitinib 2 mg p.o. twice daily.  First dose started 07/29/2022.  INTERVAL HISTORY: [name] 77 y.o. male returns to the clinic today for follow-up visit accompanied by her daughter.  The patient is feeling fine today with no concerning complaints except for the persistent fatigue and weakness.  He is currently on oral iron supplements.  He started Megace few weeks ago.  His appetite is slightly better.  The pain in his sinus and jaw is much better than before.  He was seen by his ENT doctor and CT of the maxillary sinus was performed but the final report is still pending the patient has no chest pain, shortness of breath, cough or hemoptysis.  He has no nausea, vomiting, diarrhea or constipation.  He has  no headache or visual changes.  He is currently on axitinib and tolerating it fairly well.  He is here today for evaluation and repeat blood work.    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  MEDICATIONS:  Current Outpatient Medications  Medication Sig Dispense Refill   acetaminophen (TYLENOL) 500 MG tablet Take 1,000 mg by mouth 2 (two) times daily.     apixaban (ELIQUIS) 2.5 MG TABS tablet Take 1 tablet (2.5 mg total) by mouth 2 (two) times daily. 180 tablet 1   axitinib (INLYTA) 1 MG tablet Take 2 tablets (2 mg total) by mouth 2 (two) times daily. 120 tablet 0   diazepam (VALIUM) 2 MG tablet Take 1-2 tablets (2-4 mg total) by mouth every 8 (eight) hours as needed for anxiety. (Patient not taking: Reported on 08/27/2022) 60 tablet 0  diltiazem (CARDIZEM CD) 240 MG 24 hr capsule Take 1 capsule (240 mg total) by mouth in the morning. 90 capsule 3   ezetimibe (ZETIA) 10 MG tablet Take 1 tablet (10 mg total) by mouth daily. (Patient taking differently: Take 10 mg by mouth 2 (two) times a week.) 90 tablet 3   fluticasone (FLONASE) 50 MCG/ACT nasal spray Place 2 sprays into both nostrils daily as needed for allergies.     hydrocortisone (CORTEF) 20 MG tablet Take 1 tablet (20 mg total) by mouth every morning. 90 tablet 0   hydrocortisone (CORTEF) 5 MG tablet Take by mouth 15 mg in am and 5 mg in pm 400  tablet 3   levothyroxine (SYNTHROID) 150 MCG tablet Take 1 tablet (150 mcg total) by mouth daily before breakfast. 90 tablet 3   loratadine (CLARITIN) 10 MG tablet Take 10 mg by mouth daily as needed for allergies.     megestrol (MEGACE) 20 MG tablet Take 1 tablet (20 mg total) by mouth daily. 30 tablet 1   morphine (MS CONTIN) 15 MG 12 hr tablet Take 1 tablet (15 mg total) by mouth every 12 (twelve) hours. (Patient not taking: Reported on 08/27/2022) 30 tablet 0   Multiple Vitamins-Minerals (CENTRUM SILVER 50+MEN) TABS Take 1 tablet by mouth daily with breakfast.     mupirocin ointment (BACTROBAN) 2 % Place 1 Application into the nose daily as needed (sores).     OLANZapine (ZYPREXA) 5 MG tablet Take 1 tablet (5 mg total) by mouth at bedtime. 90 tablet 0   oxyCODONE-acetaminophen (PERCOCET/ROXICET) 5-325 MG tablet Take 1 tablet by mouth every 6 (six) hours as needed for severe pain. (Patient not taking: Reported on 08/27/2022) 60 tablet 0   posaconazole (NOXAFIL) 100 MG TBEC delayed-release tablet Take 3 tablets (300 mg total) by mouth daily with breakfast. 90 tablet 2   potassium chloride SA (KLOR-CON M) 20 MEQ tablet Take 1 tablet (20 mEq total) by mouth daily. 5 tablet 0   traZODone (DESYREL) 150 MG tablet Take 150 mg by mouth at bedtime as needed for sleep.     VITAMIN D PO Take 1 capsule by mouth daily.     No current facility-administered medications for this visit.    SURGICAL HISTORY:  Past Surgical History:  Procedure Laterality Date   APPENDECTOMY  age 12   ARTERY BIOPSY Left 04/10/2022   Procedure: LEFT TEMPORAL ARTERY BIOPSY;  Surgeon: Chuck Hint, MD;  Location: [facility] OR;  Service: Vascular;  Laterality: Left;   CHOLECYSTECTOMY  10/18/1978   EYE SURGERY Bilateral 2019   cataract   LAPAROSCOPIC LYSIS OF ADHESIONS N/A 08/01/2015   Procedure: LAPAROSCOPIC LYSIS OF ADHESIONS;  Surgeon: Karie Soda, MD;  Location: [facility] ORS;  Service: General;  Laterality: N/A;   LUNG  LOBECTOMY  02/16/1990   lung cancer- patient has staples in lung not to have MRI per patient    MAXILLARY ANTROSTOMY Left 05/22/2022   Procedure: MAXILLARY ANTROSTOMY;  Surgeon: Laren Boom, DO;  Location: [facility] OR;  Service: ENT;  Laterality: Left;   PILONIDAL CYST EXCISION  02/16/2009   Dr Gerrit Friends   ROBOTIC ASSITED PARTIAL NEPHRECTOMY Right 02/01/2015   Procedure: RIGHT ROBOTIC ASSISTED LAPAROSOCOPY NEPHRECTOMY;  Surgeon: Crist Fat, MD;  Location: [facility] ORS;  Service: Urology;  Laterality: Right;   SINUS ENDO WITH FUSION Left 05/22/2022   Procedure: SINUS ENDO WITH FUSION;  Surgeon: Laren Boom, DO;  Location: [facility] OR;  Service: ENT;  Laterality: Left;  VENTRAL HERNIA REPAIR N/A 08/01/2015   Procedure: LAPAROSCOPIC VENTRAL WALL HERNIA WITH MESH;  Surgeon: Karie Soda, MD;  Location: [facility] ORS;  Service: General;  Laterality: N/A;    REVIEW OF SYSTEMS:  A comprehensive review of systems was negative except for: Constitutional: positive for anorexia, fatigue, and weight loss   PHYSICAL EXAMINATION: General appearance: alert, cooperative, fatigued, and no distress Head: Normocephalic, without obvious abnormality, atraumatic Neck: no adenopathy, no JVD, supple, symmetrical, trachea midline, and thyroid not enlarged, symmetric, no tenderness/mass/nodules Lymph nodes: Cervical, supraclavicular, and axillary nodes normal. Resp: clear to auscultation bilaterally Back: symmetric, no curvature. ROM normal. No CVA tenderness. Cardio: regular rate and rhythm, S1, S2 normal, no murmur, click, rub or gallop GI: soft, non-tender; bowel sounds normal; no masses,  no organomegaly Extremities: extremities normal, atraumatic, no cyanosis or edema  ECOG PERFORMANCE STATUS: 1 - Symptomatic but completely ambulatory  Blood pressure (!) 147/77, pulse 71, temperature 98.4 F (36.9 C), temperature source Oral, resp. rate 16, height 5\' 8"  (1.727 m), weight 155 lb 6.4 oz (70.5 kg), SpO2  99%.  LABORATORY DATA: Lab Results  Component Value Date   WBC 8.1 08/27/2022   HGB 9.3 (L) 08/27/2022   HCT 32.2 (L) 08/27/2022   MCV 75.9 (L) 08/27/2022   PLT 508 (H) 08/27/2022      Chemistry      Component Value Date/Time   NA 140 08/27/2022 1116   NA 141 08/11/2021 1252   K 3.5 08/27/2022 1116   CL 97 (L) 08/27/2022 1116   CO2 32 08/27/2022 1116   BUN 10 08/27/2022 1116   BUN 14 08/11/2021 1252   CREATININE 0.90 08/27/2022 1116      Component Value Date/Time   CALCIUM 9.0 08/27/2022 1116   ALKPHOS 282 (H) 08/24/2022 0836   AST 16 08/27/2022 1116   AST 19 08/24/2022 0836   ALT 20 08/27/2022 1116   ALT 22 08/24/2022 0836   BILITOT 0.4 08/27/2022 1116   BILITOT 0.3 08/24/2022 0836       RADIOGRAPHIC STUDIES: No results found.  ASSESSMENT AND PLAN: This is a very pleasant 77 years old white male with Stage IV clear-cell renal cell carcinoma diagnosed initially in 2016 as localized T1 a disease with evidence of metastatic disease to the liver and omentum in 2019. The patient underwent the following treatment: 1) status post radical right renal nephrectomy under the care of Dr. Marlou Porch on February 01, 2015 and the final pathology revealed 3.3 cm clear renal cell carcinoma with Fuhrman grade 3. 2) status post omental biopsy in July 2019 that confirmed the presence of recurrent metastatic renal cell carcinoma. 3) status post treatment with immunotherapy with ipilimumab 1 Mg/KG and nivolumab 3 mg/KG every 3 weeks started 09/21/2017 status post 4 cycles and the patient did not receive any additional immunotherapy secondary to adrenal insufficiency and panhypopituitarism 4) status post radiotherapy to the abdominal lymph node completed November 08, 2019. He was on treatment with Cabometyx 20 mg p.o. daily started March 2023.  His treatment has been on hold for the last 2 months because of the recent sinus issues and his final diagnosis was fungal sinusitis and currently on  treatment with voriconazole by infectious disease.  He was supposed to be holding his treatment with Cabometyx but his daughter mentioned that the patient has been taking it regularly and held it only for like 2 weeks. Unfortunately his scan showed evidence for disease progression in the abdomen including the pancreatic mass as well as  peripancreatic and retroperitoneal lymphadenopathy as well as liver lesions. I recommended for the patient to discontinue his current treatment with Cabometyx at this point. We considered the patient for treatment with axitinib initially at 5 mg p.o. twice daily but because of drug interaction with posaconazole, we will reduce his dose of treatment to 2 mg p.o. twice daily for now and may consider increase it in the future once he is done with the antifungal treatment. The patient has been tolerating this treatment well with no concerning adverse effects. I recommended for him to continue his current treatment with axitinib with the current dose for now until discontinuation of the antifungal treatment. For the anemia, he will continue with the oral iron tablet with vitamin C or orange juice. For the maxillary pain, he is followed by the palliative care team for pain management. The patient voices understanding of current disease status and treatment options and is in agreement with the current care plan.  All questions were answered. The patient knows to call the clinic with any problems, questions or concerns. We can certainly see the patient much sooner if necessary. The total time spent in the appointment was 20 minutes.  Disclaimer: This note was dictated with voice recognition software. Similar sounding words can inadvertently be transcribed and may not be corrected upon review.

## 2022-09-10 NOTE — Telephone Encounter (Signed)
This nurse reached out to patient,  spoke with him and  Daughter, [name], made them aware that patients Potassium level is low and a prescription for tablets have been sent to his pharmacy.  Also advised that his iron is very low and he is in need of IV iron infusions. Aware that scheduling will be calling to make appointments for infusions weekly for 3 weeks. Daughter acknowledged understanding and has no further questions or concerns at this time.

## 2022-09-15 ENCOUNTER — Other Ambulatory Visit: Payer: Self-pay | Admitting: Physician Assistant

## 2022-09-16 ENCOUNTER — Inpatient Hospital Stay: Payer: Medicare HMO

## 2022-09-16 ENCOUNTER — Other Ambulatory Visit: Payer: Self-pay

## 2022-09-16 VITALS — BP 164/75 | HR 68 | Temp 98.0°F | Resp 16

## 2022-09-16 DIAGNOSIS — R5383 Other fatigue: Secondary | ICD-10-CM | POA: Diagnosis not present

## 2022-09-16 DIAGNOSIS — R531 Weakness: Secondary | ICD-10-CM

## 2022-09-16 DIAGNOSIS — C787 Secondary malignant neoplasm of liver and intrahepatic bile duct: Secondary | ICD-10-CM | POA: Diagnosis not present

## 2022-09-16 DIAGNOSIS — D649 Anemia, unspecified: Secondary | ICD-10-CM | POA: Diagnosis not present

## 2022-09-16 DIAGNOSIS — R634 Abnormal weight loss: Secondary | ICD-10-CM | POA: Diagnosis not present

## 2022-09-16 DIAGNOSIS — R6884 Jaw pain: Secondary | ICD-10-CM | POA: Diagnosis not present

## 2022-09-16 DIAGNOSIS — C641 Malignant neoplasm of right kidney, except renal pelvis: Secondary | ICD-10-CM | POA: Diagnosis not present

## 2022-09-16 DIAGNOSIS — Z905 Acquired absence of kidney: Secondary | ICD-10-CM | POA: Diagnosis not present

## 2022-09-16 DIAGNOSIS — D638 Anemia in other chronic diseases classified elsewhere: Secondary | ICD-10-CM

## 2022-09-16 DIAGNOSIS — R630 Anorexia: Secondary | ICD-10-CM | POA: Diagnosis not present

## 2022-09-16 MED ORDER — SODIUM CHLORIDE 0.9 % IV SOLN: Freq: Once | INTRAVENOUS | Status: AC

## 2022-09-16 MED ORDER — LORATADINE 10 MG PO TABS
10.0000 mg | ORAL_TABLET | Freq: Once | ORAL | Status: AC
  Administered 2022-09-16: 10 mg via ORAL
  Filled 2022-09-16: qty 1

## 2022-09-16 MED ORDER — ACETAMINOPHEN 325 MG PO TABS
650.0000 mg | ORAL_TABLET | Freq: Once | ORAL | Status: AC
  Administered 2022-09-16: 650 mg via ORAL
  Filled 2022-09-16: qty 2

## 2022-09-16 MED ORDER — SODIUM CHLORIDE 0.9 % IV SOLN
300.0000 mg | Freq: Once | INTRAVENOUS | Status: AC
  Administered 2022-09-16: 300 mg via INTRAVENOUS
  Filled 2022-09-16: qty 300

## 2022-09-16 MED ORDER — DIPHENHYDRAMINE HCL 25 MG PO CAPS: 50.0000 mg | ORAL_CAPSULE | Freq: Once | ORAL | Status: DC

## 2022-09-16 NOTE — Progress Notes (Signed)
Pt declined to be observed for 30 minutes post Venofer infusion. Pt tolerated Tx well w/out incident. VSS at discharge.  W/C assist by family to car.

## 2022-09-16 NOTE — Patient Instructions (Signed)
Iron Sucrose Injection What is this medication? IRON SUCROSE (EYE ern SOO krose) treats low levels of iron (iron deficiency anemia) in people with kidney disease. Iron is a mineral that plays an important role in making red blood cells, which carry oxygen from your lungs to the rest of your body. This medicine may be used for other purposes; ask your health care provider or pharmacist if you have questions. COMMON BRAND NAME(S): Venofer What should I tell my care team before I take this medication? They need to know if you have any of these conditions: Anemia not caused by low iron levels Heart disease High levels of iron in the blood Kidney disease Liver disease An unusual or allergic reaction to iron, other medications, foods, dyes, or preservatives Pregnant or trying to get pregnant Breastfeeding How should I use this medication? This medication is for infusion into a vein. It is given in a hospital or clinic setting. Talk to your care team about the use of this medication in children. While this medication may be prescribed for children as young as 2 years for selected conditions, precautions do apply. Overdosage: If you think you have taken too much of this medicine contact a poison control center or emergency room at once. NOTE: This medicine is only for you. Do not share this medicine with others. What if I miss a dose? Keep appointments for follow-up doses. It is important not to miss your dose. Call your care team if you are unable to keep an appointment. What may interact with this medication? Do not take this medication with any of the following: Deferoxamine Dimercaprol Other iron products This medication may also interact with the following: Chloramphenicol Deferasirox This list may not describe all possible interactions. Give your health care provider a list of all the medicines, herbs, non-prescription drugs, or dietary supplements you use. Also tell them if you smoke,  drink alcohol, or use illegal drugs. Some items may interact with your medicine. What should I watch for while using this medication? Visit your care team regularly. Tell your care team if your symptoms do not start to get better or if they get worse. You may need blood work done while you are taking this medication. You may need to follow a special diet. Talk to your care team. Foods that contain iron include: whole grains/cereals, dried fruits, beans, or peas, leafy green vegetables, and organ meats (liver, kidney). What side effects may I notice from receiving this medication? Side effects that you should report to your care team as soon as possible: Allergic reactions--skin rash, itching, hives, swelling of the face, lips, tongue, or throat Low blood pressure--dizziness, feeling faint or lightheaded, blurry vision Shortness of breath Side effects that usually do not require medical attention (report to your care team if they continue or are bothersome): Flushing Headache Joint pain Muscle pain Nausea Pain, redness, or irritation at injection site This list may not describe all possible side effects. Call your doctor for medical advice about side effects. You may report side effects to FDA at 1-800-FDA-1088. Where should I keep my medication? This medication is given in a hospital or clinic. It will not be stored at home. NOTE: This sheet is a summary. It may not cover all possible information. If you have questions about this medicine, talk to your doctor, pharmacist, or health care provider.  2024 Elsevier/Gold Standard (2022-07-10 00:00:00)

## 2022-09-23 ENCOUNTER — Inpatient Hospital Stay: Payer: Medicare HMO

## 2022-09-23 ENCOUNTER — Other Ambulatory Visit: Payer: Self-pay | Admitting: Nurse Practitioner

## 2022-09-23 ENCOUNTER — Inpatient Hospital Stay: Payer: Medicare HMO | Attending: Oncology

## 2022-09-23 ENCOUNTER — Other Ambulatory Visit: Payer: Self-pay

## 2022-09-23 VITALS — BP 152/67 | HR 68 | Temp 97.7°F | Resp 17

## 2022-09-23 DIAGNOSIS — C7889 Secondary malignant neoplasm of other digestive organs: Secondary | ICD-10-CM | POA: Insufficient documentation

## 2022-09-23 DIAGNOSIS — D649 Anemia, unspecified: Secondary | ICD-10-CM | POA: Diagnosis not present

## 2022-09-23 DIAGNOSIS — C786 Secondary malignant neoplasm of retroperitoneum and peritoneum: Secondary | ICD-10-CM | POA: Diagnosis not present

## 2022-09-23 DIAGNOSIS — Z87891 Personal history of nicotine dependence: Secondary | ICD-10-CM | POA: Diagnosis not present

## 2022-09-23 DIAGNOSIS — R531 Weakness: Secondary | ICD-10-CM

## 2022-09-23 DIAGNOSIS — C641 Malignant neoplasm of right kidney, except renal pelvis: Secondary | ICD-10-CM | POA: Insufficient documentation

## 2022-09-23 DIAGNOSIS — C787 Secondary malignant neoplasm of liver and intrahepatic bile duct: Secondary | ICD-10-CM | POA: Diagnosis not present

## 2022-09-23 DIAGNOSIS — R6884 Jaw pain: Secondary | ICD-10-CM | POA: Diagnosis not present

## 2022-09-23 DIAGNOSIS — C649 Malignant neoplasm of unspecified kidney, except renal pelvis: Secondary | ICD-10-CM

## 2022-09-23 DIAGNOSIS — Z905 Acquired absence of kidney: Secondary | ICD-10-CM | POA: Insufficient documentation

## 2022-09-23 DIAGNOSIS — Z79899 Other long term (current) drug therapy: Secondary | ICD-10-CM | POA: Diagnosis not present

## 2022-09-23 DIAGNOSIS — D638 Anemia in other chronic diseases classified elsewhere: Secondary | ICD-10-CM

## 2022-09-23 DIAGNOSIS — F419 Anxiety disorder, unspecified: Secondary | ICD-10-CM

## 2022-09-23 MED ORDER — SODIUM CHLORIDE 0.9 % IV SOLN
300.0000 mg | Freq: Once | INTRAVENOUS | Status: AC
  Administered 2022-09-23: 300 mg via INTRAVENOUS
  Filled 2022-09-23: qty 300

## 2022-09-23 MED ORDER — SODIUM CHLORIDE 0.9 % IV SOLN: Freq: Once | INTRAVENOUS | Status: AC

## 2022-09-23 MED ORDER — ACETAMINOPHEN 325 MG PO TABS
650.0000 mg | ORAL_TABLET | Freq: Once | ORAL | Status: AC
  Administered 2022-09-23: 650 mg via ORAL
  Filled 2022-09-23: qty 2

## 2022-09-23 MED ORDER — LORATADINE 10 MG PO TABS
10.0000 mg | ORAL_TABLET | Freq: Once | ORAL | Status: AC
  Administered 2022-09-23: 10 mg via ORAL
  Filled 2022-09-23: qty 1

## 2022-09-23 NOTE — Patient Instructions (Signed)
Iron Sucrose Injection What is this medication? IRON SUCROSE (EYE ern SOO krose) treats low levels of iron (iron deficiency anemia) in people with kidney disease. Iron is a mineral that plays an important role in making red blood cells, which carry oxygen from your lungs to the rest of your body. This medicine may be used for other purposes; ask your health care provider or pharmacist if you have questions. COMMON BRAND NAME(S): Venofer What should I tell my care team before I take this medication? They need to know if you have any of these conditions: Anemia not caused by low iron levels Heart disease High levels of iron in the blood Kidney disease Liver disease An unusual or allergic reaction to iron, other medications, foods, dyes, or preservatives Pregnant or trying to get pregnant Breastfeeding How should I use this medication? This medication is for infusion into a vein. It is given in a hospital or clinic setting. Talk to your care team about the use of this medication in children. While this medication may be prescribed for children as young as 2 years for selected conditions, precautions do apply. Overdosage: If you think you have taken too much of this medicine contact a poison control center or emergency room at once. NOTE: This medicine is only for you. Do not share this medicine with others. What if I miss a dose? Keep appointments for follow-up doses. It is important not to miss your dose. Call your care team if you are unable to keep an appointment. What may interact with this medication? Do not take this medication with any of the following: Deferoxamine Dimercaprol Other iron products This medication may also interact with the following: Chloramphenicol Deferasirox This list may not describe all possible interactions. Give your health care provider a list of all the medicines, herbs, non-prescription drugs, or dietary supplements you use. Also tell them if you smoke,  drink alcohol, or use illegal drugs. Some items may interact with your medicine. What should I watch for while using this medication? Visit your care team regularly. Tell your care team if your symptoms do not start to get better or if they get worse. You may need blood work done while you are taking this medication. You may need to follow a special diet. Talk to your care team. Foods that contain iron include: whole grains/cereals, dried fruits, beans, or peas, leafy green vegetables, and organ meats (liver, kidney). What side effects may I notice from receiving this medication? Side effects that you should report to your care team as soon as possible: Allergic reactions--skin rash, itching, hives, swelling of the face, lips, tongue, or throat Low blood pressure--dizziness, feeling faint or lightheaded, blurry vision Shortness of breath Side effects that usually do not require medical attention (report to your care team if they continue or are bothersome): Flushing Headache Joint pain Muscle pain Nausea Pain, redness, or irritation at injection site This list may not describe all possible side effects. Call your doctor for medical advice about side effects. You may report side effects to FDA at 1-800-FDA-1088. Where should I keep my medication? This medication is given in a hospital or clinic. It will not be stored at home. NOTE: This sheet is a summary. It may not cover all possible information. If you have questions about this medicine, talk to your doctor, pharmacist, or health care provider.  2024 Elsevier/Gold Standard (2022-07-10 00:00:00)

## 2022-09-23 NOTE — Progress Notes (Signed)
Pt declined to stay for full 30 min post obs, discharged with VSS.

## 2022-09-24 ENCOUNTER — Other Ambulatory Visit (HOSPITAL_COMMUNITY): Payer: Self-pay

## 2022-09-28 ENCOUNTER — Other Ambulatory Visit (HOSPITAL_COMMUNITY): Payer: Self-pay

## 2022-09-28 ENCOUNTER — Other Ambulatory Visit: Payer: Self-pay | Admitting: Internal Medicine

## 2022-09-28 ENCOUNTER — Other Ambulatory Visit: Payer: Self-pay

## 2022-09-28 DIAGNOSIS — C649 Malignant neoplasm of unspecified kidney, except renal pelvis: Secondary | ICD-10-CM

## 2022-09-28 MED ORDER — AXITINIB 1 MG PO TABS
2.0000 mg | ORAL_TABLET | Freq: Two times a day (BID) | ORAL | 0 refills | Status: DC
Start: 2022-09-28 — End: 2022-10-26
  Filled 2022-09-28: qty 120, 30d supply, fill #0

## 2022-09-28 NOTE — Progress Notes (Unsigned)
Palliative Medicine [facility]  Telephone:([phone]) [phone] Fax:(336) [phone]   Name: [name] Date: 09/28/2022 MRN: [id number]  DOB: February 22, 1945  Patient Care Team: [name], MD as PCP - General (Family Medicine) Thurmon Fair, MD as PCP - Cardiology (Cardiology) Karie Soda, MD as Consulting Physician (General Surgery) Crist Fat, MD as Consulting Physician (Urology)    INTERVAL HISTORY: Lakshya Martinie is a 77 y.o. 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 Palliative ask to see for symptom and pain management and goals of care.   SOCIAL HISTORY:    Mr. Woodring reports that he quit smoking about 34 years ago. His smoking use included cigarettes. He started smoking about 54 years ago. He has a 20 pack-year smoking history. He has never used smokeless tobacco. He reports that he does not currently use alcohol. He reports that he does not use drugs.  ADVANCE DIRECTIVES:  None on file CODE STATUS: Full code  PAST MEDICAL HISTORY: Past Medical History:  Diagnosis Date   Arthritis    Chronic kidney disease    only has one kidney    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 02/01/2015   Coronary artery disease    followed by Dr.Tilley   Frequent PVCs    GERD (gastroesophageal reflux disease)    Heart murmur    never has caused any problems   Hx of cancer of lung 1980's   Hyperlipidemia    Hypertension    Hypothyroidism    Incisional hernia 08/01/2015   Lung cancer (HCC) 1993   Lung metastases 2019   Pneumonia    x several   Recurrent umbilical hernia 08/01/2015    ALLERGIES:  is allergic to diphenhydramine.  MEDICATIONS:  Current Outpatient Medications  Medication Sig Dispense Refill   acetaminophen (TYLENOL) 500 MG tablet Take 1,000 mg by mouth 2 (two) times daily.      apixaban (ELIQUIS) 2.5 MG TABS tablet Take 1 tablet (2.5 mg total) by mouth 2 (two) times daily. 180 tablet 1   axitinib (INLYTA) 1 MG tablet Take 2 tablets (2 mg total) by mouth 2 (two) times daily. 120 tablet 0   diazepam (VALIUM) 2 MG tablet TAKE 1-2 TABLETS BY MOUTH EVERY 8 HOURS AS NEEDED FOR ANXIETY 60 tablet 0   diltiazem (CARDIZEM CD) 240 MG 24 hr capsule Take 1 capsule (240 mg total) by mouth in the morning. 90 capsule 3   ezetimibe (ZETIA) 10 MG tablet Take 1 tablet (10 mg total) by mouth daily. (Patient taking differently: Take 10 mg by mouth 2 (two) times a week.) 90 tablet 3   fluticasone (FLONASE) 50 MCG/ACT nasal spray Place 2 sprays into both nostrils daily as needed for allergies.     hydrocortisone (CORTEF) 5 MG tablet Take by mouth 15 mg in am and 5 mg in pm 400 tablet 3   levothyroxine (SYNTHROID) 150 MCG tablet Take 1 tablet (150 mcg total) by mouth daily before breakfast. 90 tablet 3   loratadine (CLARITIN) 10 MG tablet Take 10 mg by mouth daily as needed for allergies.     megestrol (MEGACE) 20 MG tablet Take 1 tablet (20 mg total) by mouth daily. 30 tablet 1   Multiple Vitamins-Minerals (CENTRUM SILVER 50+MEN) TABS Take 1 tablet by mouth daily with breakfast.     mupirocin ointment (BACTROBAN) 2 % Place 1  Application into the nose daily as needed (sores).     OLANZapine (ZYPREXA) 5 MG tablet Take 1 tablet (5 mg total) by mouth at bedtime. 90 tablet 0   potassium chloride SA (KLOR-CON M) 20 MEQ tablet Take 1 tablet (20 mEq total) by mouth daily. 14 tablet 0   traZODone (DESYREL) 150 MG tablet Take 150 mg by mouth at bedtime as needed for sleep.     VITAMIN D PO Take 1 capsule by mouth daily.     No current facility-administered medications for this visit.    VITAL SIGNS: There were no vitals taken for this visit. There were no vitals filed for this visit.  Estimated body mass index is 23.63 kg/m as calculated from the following:   Height as of 09/10/22: 5\' 8"  (1.727  m).   Weight as of 09/10/22: 155 lb 6.4 oz (70.5 kg).     Latest Ref Rng & Units 09/10/2022    7:54 AM 08/27/2022   11:16 AM 08/24/2022    8:36 AM  CBC  WBC 4.0 - 10.5 K/uL 8.6  8.1  8.9   Hemoglobin 13.0 - 17.0 g/dL 8.9  9.3  8.9   Hematocrit 39.0 - 52.0 % 31.2  32.2  30.9   Platelets 150 - 400 K/uL 412  508  437        Latest Ref Rng & Units 09/10/2022    7:54 AM 08/27/2022   11:16 AM 08/24/2022    8:36 AM  CMP  Glucose 70 - 99 mg/dL 161  096  90   BUN 8 - 23 mg/dL 13  10  13    Creatinine 0.61 - 1.24 mg/dL 0.45  4.09  8.11   Sodium 135 - 145 mmol/L 140  140  140   Potassium 3.5 - 5.1 mmol/L 2.9  3.5  3.2   Chloride 98 - 111 mmol/L 99  97  99   CO2 22 - 32 mmol/L 31  32  33   Calcium 8.9 - 10.3 mg/dL 9.2  9.0  9.7   Total Protein 6.5 - 8.1 g/dL 6.9  6.4  7.4   Total Bilirubin 0.3 - 1.2 mg/dL 0.4  0.4  0.3   Alkaline Phos 38 - 126 U/L 96   282   AST 15 - 41 U/L 21  16  19    ALT 0 - 44 U/L 36  20  22      PERFORMANCE STATUS (ECOG) : 1 - Symptomatic but completely ambulatory  Assessment NAD, in wheelchair RRR Normal breathing pattern AAO x4  IMPRESSION: I saw Mr. Pucci during his infusion. No acute distress. Daughter present. Denies nausea, vomiting, constipation, or diarrhea.  Continues to endorse ongoing fatigue.  Is taking things one day at a time.  Daughter shares he did have a good day over the weekend which she has not had in a while.  They are both much appreciative of this.  Is hopeful for this to occur more often in the days to come.  Denies any concerns with insomnia.  No additional symptom management needs at this time.    PLAN:  Valium 2 mg as needed for anxiety(does not require daily) Ongoing symptom management and goals of care support. Palliative will plan to see patient back in 3-4 weeks in collaboration to other oncology appointments.  Patient knows to contact our office if needed sooner.  Patient expressed understanding and was in agreement with this  plan. He also understands that He can call  the clinic at any time with any questions, concerns, or complaints.   Any controlled substances utilized were prescribed in the context of palliative care. PDMP has been reviewed.   Visit consisted of counseling and education dealing with the complex and emotionally intense issues of symptom management and palliative care in the setting of serious and potentially life-threatening illness.Greater than 50%  of this time was spent counseling and coordinating care related to the above assessment and plan.   Willette Alma, AGPCNP-BC  Palliative Medicine Team/[facility]  *Please note that this is a verbal dictation therefore any spelling or grammatical errors are due to the "Dragon [employer] One" system interpretation.

## 2022-09-30 ENCOUNTER — Telehealth: Payer: Self-pay

## 2022-09-30 ENCOUNTER — Other Ambulatory Visit (HOSPITAL_COMMUNITY): Payer: Self-pay

## 2022-09-30 ENCOUNTER — Inpatient Hospital Stay (HOSPITAL_BASED_OUTPATIENT_CLINIC_OR_DEPARTMENT_OTHER): Payer: Medicare HMO | Admitting: Nurse Practitioner

## 2022-09-30 ENCOUNTER — Inpatient Hospital Stay: Payer: Medicare HMO

## 2022-09-30 ENCOUNTER — Encounter: Payer: Self-pay | Admitting: Nurse Practitioner

## 2022-09-30 ENCOUNTER — Other Ambulatory Visit: Payer: Self-pay

## 2022-09-30 VITALS — BP 154/66 | HR 63 | Temp 98.0°F | Resp 18

## 2022-09-30 DIAGNOSIS — D649 Anemia, unspecified: Secondary | ICD-10-CM | POA: Diagnosis not present

## 2022-09-30 DIAGNOSIS — Z515 Encounter for palliative care: Secondary | ICD-10-CM

## 2022-09-30 DIAGNOSIS — C787 Secondary malignant neoplasm of liver and intrahepatic bile duct: Secondary | ICD-10-CM | POA: Diagnosis not present

## 2022-09-30 DIAGNOSIS — C649 Malignant neoplasm of unspecified kidney, except renal pelvis: Secondary | ICD-10-CM

## 2022-09-30 DIAGNOSIS — Z79899 Other long term (current) drug therapy: Secondary | ICD-10-CM | POA: Diagnosis not present

## 2022-09-30 DIAGNOSIS — R530 Neoplastic (malignant) related fatigue: Secondary | ICD-10-CM

## 2022-09-30 DIAGNOSIS — R531 Weakness: Secondary | ICD-10-CM

## 2022-09-30 DIAGNOSIS — C7889 Secondary malignant neoplasm of other digestive organs: Secondary | ICD-10-CM | POA: Diagnosis not present

## 2022-09-30 DIAGNOSIS — R6884 Jaw pain: Secondary | ICD-10-CM | POA: Diagnosis not present

## 2022-09-30 DIAGNOSIS — Z905 Acquired absence of kidney: Secondary | ICD-10-CM | POA: Diagnosis not present

## 2022-09-30 DIAGNOSIS — D638 Anemia in other chronic diseases classified elsewhere: Secondary | ICD-10-CM

## 2022-09-30 DIAGNOSIS — C786 Secondary malignant neoplasm of retroperitoneum and peritoneum: Secondary | ICD-10-CM | POA: Diagnosis not present

## 2022-09-30 DIAGNOSIS — Z87891 Personal history of nicotine dependence: Secondary | ICD-10-CM | POA: Diagnosis not present

## 2022-09-30 DIAGNOSIS — C641 Malignant neoplasm of right kidney, except renal pelvis: Secondary | ICD-10-CM | POA: Diagnosis not present

## 2022-09-30 MED ORDER — SODIUM CHLORIDE 0.9 % IV SOLN: Freq: Once | INTRAVENOUS | Status: AC

## 2022-09-30 MED ORDER — LORATADINE 10 MG PO TABS
10.0000 mg | ORAL_TABLET | Freq: Once | ORAL | Status: AC
  Administered 2022-09-30: 10 mg via ORAL
  Filled 2022-09-30: qty 1

## 2022-09-30 MED ORDER — ACETAMINOPHEN 325 MG PO TABS
650.0000 mg | ORAL_TABLET | Freq: Once | ORAL | Status: AC
  Administered 2022-09-30: 650 mg via ORAL
  Filled 2022-09-30: qty 2

## 2022-09-30 MED ORDER — SODIUM CHLORIDE 0.9 % IV SOLN
300.0000 mg | Freq: Once | INTRAVENOUS | Status: AC
  Administered 2022-09-30: 300 mg via INTRAVENOUS
  Filled 2022-09-30: qty 300

## 2022-09-30 NOTE — Patient Instructions (Signed)
 Iron Sucrose Injection What is this medication? IRON SUCROSE (EYE ern SOO krose) treats low levels of iron (iron deficiency anemia) in people with kidney disease. Iron is a mineral that plays an important role in making red blood cells, which carry oxygen from your lungs to the rest of your body. This medicine may be used for other purposes; ask your health care provider or pharmacist if you have questions. COMMON BRAND NAME(S): Venofer What should I tell my care team before I take this medication? They need to know if you have any of these conditions: Anemia not caused by low iron levels Heart disease High levels of iron in the blood Kidney disease Liver disease An unusual or allergic reaction to iron, other medications, foods, dyes, or preservatives Pregnant or trying to get pregnant Breastfeeding How should I use this medication? This medication is for infusion into a vein. It is given in a hospital or clinic setting. Talk to your care team about the use of this medication in children. While this medication may be prescribed for children as young as 2 years for selected conditions, precautions do apply. Overdosage: If you think you have taken too much of this medicine contact a poison control center or emergency room at once. NOTE: This medicine is only for you. Do not share this medicine with others. What if I miss a dose? Keep appointments for follow-up doses. It is important not to miss your dose. Call your care team if you are unable to keep an appointment. What may interact with this medication? Do not take this medication with any of the following: Deferoxamine Dimercaprol Other iron products This medication may also interact with the following: Chloramphenicol Deferasirox This list may not describe all possible interactions. Give your health care provider a list of all the medicines, herbs, non-prescription drugs, or dietary supplements you use. Also tell them if you smoke,  drink alcohol, or use illegal drugs. Some items may interact with your medicine. What should I watch for while using this medication? Visit your care team regularly. Tell your care team if your symptoms do not start to get better or if they get worse. You may need blood work done while you are taking this medication. You may need to follow a special diet. Talk to your care team. Foods that contain iron include: whole grains/cereals, dried fruits, beans, or peas, leafy green vegetables, and organ meats (liver, kidney). What side effects may I notice from receiving this medication? Side effects that you should report to your care team as soon as possible: Allergic reactions--skin rash, itching, hives, swelling of the face, lips, tongue, or throat Low blood pressure--dizziness, feeling faint or lightheaded, blurry vision Shortness of breath Side effects that usually do not require medical attention (report to your care team if they continue or are bothersome): Flushing Headache Joint pain Muscle pain Nausea Pain, redness, or irritation at injection site This list may not describe all possible side effects. Call your doctor for medical advice about side effects. You may report side effects to FDA at 1-800-FDA-1088. Where should I keep my medication? This medication is given in a hospital or clinic. It will not be stored at home. NOTE: This sheet is a summary. It may not cover all possible information. If you have questions about this medicine, talk to your doctor, pharmacist, or health care provider.  2024 Elsevier/Gold Standard (2022-07-10 00:00:00)

## 2022-09-30 NOTE — Telephone Encounter (Signed)
Patient daughter called requesting refill on Posaconazole. Requesting for refill to be sent to [facility].

## 2022-09-30 NOTE — Progress Notes (Signed)
Pt declined 30 minute observation period post Venofer infusion. VSS. No complaints at time of discharge.  

## 2022-10-01 ENCOUNTER — Encounter: Payer: Self-pay | Admitting: Physician Assistant

## 2022-10-01 MED ORDER — POSACONAZOLE 100 MG PO TBEC: 300.0000 mg | DELAYED_RELEASE_TABLET | Freq: Every day | ORAL | 0 refills | Status: DC

## 2022-10-01 NOTE — Addendum Note (Signed)
Addended by: Jeanine Luz D on: 10/01/2022 03:05 PM   Modules accepted: Orders

## 2022-10-01 NOTE — Telephone Encounter (Signed)
Patient's daughter aware of refill sent to pharmacy

## 2022-10-01 NOTE — Telephone Encounter (Signed)
Which dosage to refill for posaconazole?

## 2022-10-01 NOTE — Telephone Encounter (Signed)
Sent!

## 2022-10-12 ENCOUNTER — Encounter: Payer: Self-pay | Admitting: Internal Medicine

## 2022-10-12 ENCOUNTER — Other Ambulatory Visit: Payer: Self-pay

## 2022-10-12 ENCOUNTER — Ambulatory Visit: Payer: Medicare HMO | Admitting: Internal Medicine

## 2022-10-12 VITALS — BP 159/89 | HR 74 | Temp 97.7°F | Ht 68.0 in | Wt 151.0 lb

## 2022-10-12 DIAGNOSIS — J329 Chronic sinusitis, unspecified: Secondary | ICD-10-CM | POA: Diagnosis not present

## 2022-10-12 DIAGNOSIS — B49 Unspecified mycosis: Secondary | ICD-10-CM

## 2022-10-12 NOTE — Progress Notes (Signed)
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) 06/10/2022   Maxillary sinusitis 05/22/2022   Jaw pain 03/31/2022   Adrenal insufficiency (HCC) 08/23/2018   Atherosclerosis of aorta (HCC) 08/23/2018   Acquired hypothyroidism 04/19/2018   Secondary adrenal insufficiency (HCC) 04/19/2018   Acute hyponatremia 03/24/2018   Essential hypertension    Protein-calorie malnutrition, severe 03/14/2018   Generalized weakness 03/13/2018   Hyperlipidemia 11/30/2017   Kidney cancer, primary, with metastasis from kidney to other site [facility]) 09/14/2017   Recurrent umbilical hernia 08/01/2015   Incisional hernia 08/01/2015   Hypertensive heart disease without CHF    GERD (gastroesophageal reflux disease)    Chronic kidney disease    Coronary artery disease    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 02/01/2015    Patient's Medications  New Prescriptions   No medications on file  Previous Medications   ACETAMINOPHEN (TYLENOL) 500 MG TABLET    Take 1,000 mg by mouth 2 (two) times daily.   APIXABAN (ELIQUIS) 2.5 MG TABS TABLET    Take 1 tablet (2.5 mg total) by mouth 2 (two) times daily.   AXITINIB (INLYTA) 1 MG TABLET    Take 2 tablets (2 mg total) by mouth 2 (two) times daily.   DIAZEPAM (VALIUM) 2 MG TABLET    TAKE 1-2 TABLETS BY MOUTH EVERY 8 HOURS AS NEEDED FOR ANXIETY   DILTIAZEM (CARDIZEM CD) 240 MG 24 HR CAPSULE    Take 1 capsule (240 mg total) by mouth in the morning.   EZETIMIBE (ZETIA) 10 MG TABLET    Take  1 tablet (10 mg total) by mouth daily.   FLUTICASONE (FLONASE) 50 MCG/ACT NASAL SPRAY    Place 2 sprays into both nostrils daily as needed for allergies.   HYDROCORTISONE (CORTEF) 5 MG TABLET    Take by mouth 15 mg in am and 5 mg in pm   LEVOTHYROXINE (SYNTHROID) 150 MCG TABLET    Take 1 tablet (150 mcg total) by mouth daily before breakfast.   LORATADINE (CLARITIN) 10 MG TABLET    Take 10 mg by mouth daily as needed for allergies.   MEGESTROL (MEGACE) 20 MG TABLET    Take 1 tablet (20 mg total) by mouth daily.   MULTIPLE VITAMINS-MINERALS (CENTRUM SILVER 50+MEN) TABS    Take 1 tablet by mouth daily with breakfast.   MUPIROCIN OINTMENT (BACTROBAN) 2 %    Place 1 Application into the nose daily as needed (sores).   OLANZAPINE (ZYPREXA) 5 MG TABLET    Take 1 tablet (5 mg total) by mouth at bedtime.   POSACONAZOLE (NOXAFIL) 100 MG TBEC DELAYED-RELEASE TABLET    Take 3 tablets (300 mg total) by mouth daily.   POTASSIUM CHLORIDE SA (KLOR-CON M) 20 MEQ TABLET    Take 1 tablet (20 mEq total) by mouth daily.   TRAZODONE (DESYREL) 150 MG TABLET    Take 150 mg by mouth at bedtime as needed for sleep.   VITAMIN D PO    Take 1 capsule  by mouth daily.  Modified Medications   No medications on file  Discontinued Medications   No medications on file    Subjective: 77 year old male with past medical history of (static clear-cell renal carcinoma with liver mets status post right nephrectomy December 2016, status post omental biopsy July 2019 s/p immunotherapy with Ipilizumab and nivolumab  stopped due to adrenal insufficiency and panhypopituitarism, s/p radiotherapy completed sept 2021 with chemotherapy started with Cabometyx March 2023 but stopped in the setting of Aspergillus sinusitis ( follows Dr Arbutus Ped) CAD, lung cancer status post lobectomy 1992,  HLD, HTN, hypothyroidism presents for follow-up of 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  He was last seen on 07/22/2022. Patient was  initially started on voriconazole in April by ENT.  Did not tolerate medication and had symptoms of insomnia and night sweats.  Noted them voriconazole trough level 1 week after starting therapy but slightly above right side.  Dose adjusted but then developed hyponatremia and admitted for postop.  Subsequently transition to posaconazole 300 mg daily on 06/24/2022.He had a repeat CT done on 5/27 to assess medication impact.  Noted similar appearance erosion destruction of the posterior wall of left maxillary sinus, severe sinus wall thickening and air loculation.  Posaconazole was continued as he had only been on medication for 2 weeks at that point. Today 08/27/22: Patient thought appointment was at 10:00 as such was about an hour late to visit. Denied acute complaints to me.  He does state that he has had increased weakness and decreased appetite for about 3 weeks. Pt states he could hardly put his clothes on. Pt has lost 8 pounds since June.  He thought that it may have been related to his posaconazole.  He stopped taking posaconazole for about 5 days but did not see significant improvement in symptoms.  Restarted posaconazole about 3 days ago.  Denies fever, chills, dizziness, headache, syncopal episodes, acute complaints   He was last seen by medical oncology on 08/24/2022.  He was switched to a lower dose of axitinib due to drug-drug interaction with posaconazole.  At that visit patient had noted fatigue, weakness, weight loss, poor appetite which patient attributed to antifungal treatment.  He is also followed by palliative care.  Megace was prescribed at that visit.  Which patient has not picked up.  Recent CT abdomen showed pancreatic mass as well as peripancreatic retroperioneal LAD as well as liver lesions.  Today 10/12/2022: Patient has been seen by palliative care and started on Valium since last visit seen by heme-onc as well. Presents with family and pt mowed the lawn last weekend. Review of  Systems: Review of Systems  All other systems reviewed and are negative.   Past Medical History:  Diagnosis Date   Arthritis    Chronic kidney disease    only has one kidney    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 02/01/2015   Coronary artery disease    followed by Dr.Tilley   Frequent PVCs    GERD (gastroesophageal reflux disease)    Heart murmur    never has caused any problems   Hx of cancer of lung 1980's   Hyperlipidemia    Hypertension    Hypothyroidism    Incisional hernia 08/01/2015   Lung cancer (HCC) 1993   Lung metastases 2019   Pneumonia    x several   Recurrent umbilical hernia 08/01/2015    Social History   Tobacco Use   Smoking status: Former  Current packs/day: 0.00    Average packs/day: 1 pack/day for 20.0 years (20.0 ttl pk-yrs)    Types: Cigarettes    Start date: 01/29/1968    Quit date: 01/29/1988    Years since quitting: 34.7   Smokeless tobacco: Never  Vaping Use   Vaping status: Never Used  Substance Use Topics   Alcohol use: Not Currently    Comment: rare   Drug use: No    Family History  Problem Relation Age of Onset   Heart attack Father     Allergies  Allergen Reactions   Diphenhydramine Other (See Comments)    Severe restless legs    Health Maintenance  Topic Date Due   COVID-19 Vaccine (1) Never done   Pneumonia Vaccine 38+ Years old (1 of 2 - PCV) Never done   Hepatitis C Screening  Never done   DTaP/Tdap/Td (1 - Tdap) Never done   Zoster Vaccines- Shingrix (1 of 2) Never done   INFLUENZA VACCINE  09/17/2022   HPV VACCINES  Aged Out    Objective:  There were no vitals filed for this visit. There is no height or weight on file to calculate BMI.  Physical Exam Constitutional:      General: He is not in acute distress.    Appearance: He is normal weight. He is not toxic-appearing.  HENT:     Head: Normocephalic and atraumatic.     Right Ear: External ear normal.     Left Ear:  External ear normal.     Nose: No congestion or rhinorrhea.     Mouth/Throat:     Mouth: Mucous membranes are moist.     Pharynx: Oropharynx is clear.  Eyes:     Extraocular Movements: Extraocular movements intact.     Conjunctiva/sclera: Conjunctivae normal.     Pupils: Pupils are equal, round, and reactive to light.  Cardiovascular:     Rate and Rhythm: Normal rate and regular rhythm.     Heart sounds: No murmur heard.    No friction rub. No gallop.  Pulmonary:     Effort: Pulmonary effort is normal.     Breath sounds: Normal breath sounds.  Abdominal:     General: Abdomen is flat. Bowel sounds are normal.     Palpations: Abdomen is soft.  Musculoskeletal:        General: No swelling. Normal range of motion.     Cervical back: Normal range of motion and neck supple.  Skin:    General: Skin is warm and dry.  Neurological:     General: No focal deficit present.     Mental Status: He is oriented to person, place, and time.  Psychiatric:        Mood and Affect: Mood normal.     Lab Results Lab Results  Component Value Date   WBC 8.6 09/10/2022   HGB 8.9 (L) 09/10/2022   HCT 31.2 (L) 09/10/2022   MCV 75.7 (L) 09/10/2022   PLT 412 (H) 09/10/2022    Lab Results  Component Value Date   CREATININE 0.89 09/10/2022   BUN 13 09/10/2022   NA 140 09/10/2022   K 2.9 (L) 09/10/2022   CL 99 09/10/2022   CO2 31 09/10/2022    Lab Results  Component Value Date   ALT 36 09/10/2022   AST 21 09/10/2022   ALKPHOS 96 09/10/2022   BILITOT 0.4 09/10/2022    Lab Results  Component Value Date   CHOL 153 03/26/2022  HDL 45 03/26/2022   LDLCALC 86 03/26/2022   TRIG 123 03/26/2022   CHOLHDL 3.4 03/26/2022   No results found for: "LABRPR", "RPRTITER" No results found for: "HIV1RNAQUANT", "HIV1RNAVL", "CD4TABS"   Problem List Items Addressed This Visit   None  Assessment/Plan 77 year old male with stage IV clear-cell renal carcinoma initially diagnosed 2016 localized T1  disease with mets to liver and omentum in 2019 on lower dose ofaxitinib presents for: #Invasive fungal sinusitis/Primary on posaconazole #Decrease appetite - Patient was initially started on voriconazole by ENT back in April.  This was discontinued due to admission for hyponatremia and patient transition to posaconazole 300 mg daily. - He has been on pause of since 06/24/2022.  Now has worsening weakness and decreased appetite x 3 weeks.  The symptoms did not significantly improve with stopping posa for about 5 days which he has not restarted. -Saw pt with pharmacy today and reviewed medication interactions.  He is not taking trazodone for a while. Last visit: -Pt is clinically stable. , EKG with qtc 420. Labs today -Continue poza at 300 mg daily(therapeutic level on 5/22 1.41). The drug-drug interaction of axitinib and poza could be attributing to symptoms. Dose of axitinib was lowered about a month a ago.   -Agree with megace, may need to go up on dosing.(He has not started megace yet).  Ultimately, I think the underlying issue for pt's weakness and loss appetite is likely malignancy +/- posaconzole + axitinib interaction For  treatment of invasive aspergillosis of the sinuses, continue oral antifungal therapy for at least 4-6 months to prevent recurrence of the disease  -F/U with pt on 8/26 to review symptoms and  review repeat CT(last on on 5/27)  Interim: followed by  palliative , valluim added Today 10/12/22: Qtc 430 today.  Reviewed CT max from 7/28 and noted no substantial change in circumferential soft tissue involving the left maxillary sinus, including destruction of posterolateral wall left and involvement of adjacent left masticator space.  Stability findings of favor inflammatory infectious process or malignancy.   Overall patient energy seems to be improved per today's visit, family noted he mowed the lawn over the weekend.  Although his weight is about the same, 154 at last visit 151  today. Recommendations: - Continuing Megace and posaconazole as well - Will get labs today - Plan to follow-up at the beginning of November at that point patient would have completed 6 months of therapy.  Will repeat imaging CT maxillofacial at that time to assess any change.   Danelle Earthly, MD [facility]  I have personally spent 61 minutes involved in face-to-face and non-face-to-face activities for this patient on the day of the visit. Professional time spent includes the following activities: Preparing to see the patient (review of tests), Obtaining and/or reviewing separately obtained history (admission/discharge record), Performing a medically appropriate examination and/or evaluation , Ordering medications/tests/procedures, referring and communicating with other health care professionals, Documenting clinical information in the EMR, Independently interpreting results (not separately reported), Communicating results to the patient/family/caregiver, Counseling and educating the patient/family/caregiver and Care coordination (not separately reported).   10/12/2022, 5:59 AM

## 2022-10-13 LAB — CBC WITH DIFFERENTIAL/PLATELET
Absolute Monocytes: 720 {cells}/uL (ref 200–950)
Basophils Absolute: 23 {cells}/uL (ref 0–200)
Basophils Relative: 0.3 %
Eosinophils Absolute: 98 {cells}/uL (ref 15–500)
Eosinophils Relative: 1.3 %
HCT: 32.6 % — ABNORMAL LOW (ref 38.5–50.0)
Hemoglobin: 9.4 g/dL — ABNORMAL LOW (ref 13.2–17.1)
Lymphs Abs: 1245 {cells}/uL (ref 850–3900)
MCH: 21.2 pg — ABNORMAL LOW (ref 27.0–33.0)
MCHC: 28.8 g/dL — ABNORMAL LOW (ref 32.0–36.0)
MCV: 73.6 fL — ABNORMAL LOW (ref 80.0–100.0)
MPV: 10.5 fL (ref 7.5–12.5)
Monocytes Relative: 9.6 %
Neutro Abs: 5415 {cells}/uL (ref 1500–7800)
Neutrophils Relative %: 72.2 %
Platelets: 423 10*3/uL — ABNORMAL HIGH (ref 140–400)
RBC: 4.43 10*6/uL (ref 4.20–5.80)
RDW: 20.5 % — ABNORMAL HIGH (ref 11.0–15.0)
Total Lymphocyte: 16.6 %
WBC: 7.5 10*3/uL (ref 3.8–10.8)

## 2022-10-13 LAB — COMPLETE METABOLIC PANEL WITH GFR
AG Ratio: 1 (calc) (ref 1.0–2.5)
ALT: 23 U/L (ref 9–46)
AST: 20 U/L (ref 10–35)
Albumin: 3.2 g/dL — ABNORMAL LOW (ref 3.6–5.1)
Alkaline phosphatase (APISO): 90 U/L (ref 35–144)
BUN: 12 mg/dL (ref 7–25)
CO2: 31 mmol/L (ref 20–32)
Calcium: 8.8 mg/dL (ref 8.6–10.3)
Chloride: 97 mmol/L — ABNORMAL LOW (ref 98–110)
Creat: 0.97 mg/dL (ref 0.70–1.28)
Globulin: 3.2 g/dL (ref 1.9–3.7)
Glucose, Bld: 119 mg/dL — ABNORMAL HIGH (ref 65–99)
Potassium: 2.9 mmol/L — ABNORMAL LOW (ref 3.5–5.3)
Sodium: 141 mmol/L (ref 135–146)
Total Bilirubin: 0.3 mg/dL (ref 0.2–1.2)
Total Protein: 6.4 g/dL (ref 6.1–8.1)
eGFR: 79 mL/min/{1.73_m2} (ref >=60)

## 2022-10-14 ENCOUNTER — Inpatient Hospital Stay: Payer: Medicare HMO

## 2022-10-14 ENCOUNTER — Inpatient Hospital Stay: Payer: Medicare HMO | Admitting: Internal Medicine

## 2022-10-14 ENCOUNTER — Telehealth: Payer: Self-pay

## 2022-10-14 VITALS — BP 160/80 | HR 72 | Temp 98.3°F | Resp 16 | Ht 68.0 in | Wt 153.0 lb

## 2022-10-14 DIAGNOSIS — Z79899 Other long term (current) drug therapy: Secondary | ICD-10-CM | POA: Diagnosis not present

## 2022-10-14 DIAGNOSIS — C786 Secondary malignant neoplasm of retroperitoneum and peritoneum: Secondary | ICD-10-CM | POA: Diagnosis not present

## 2022-10-14 DIAGNOSIS — C641 Malignant neoplasm of right kidney, except renal pelvis: Secondary | ICD-10-CM

## 2022-10-14 DIAGNOSIS — C7889 Secondary malignant neoplasm of other digestive organs: Secondary | ICD-10-CM | POA: Diagnosis not present

## 2022-10-14 DIAGNOSIS — Z87891 Personal history of nicotine dependence: Secondary | ICD-10-CM | POA: Diagnosis not present

## 2022-10-14 DIAGNOSIS — Z905 Acquired absence of kidney: Secondary | ICD-10-CM | POA: Diagnosis not present

## 2022-10-14 DIAGNOSIS — R6884 Jaw pain: Secondary | ICD-10-CM | POA: Diagnosis not present

## 2022-10-14 DIAGNOSIS — C787 Secondary malignant neoplasm of liver and intrahepatic bile duct: Secondary | ICD-10-CM | POA: Diagnosis not present

## 2022-10-14 DIAGNOSIS — D649 Anemia, unspecified: Secondary | ICD-10-CM | POA: Diagnosis not present

## 2022-10-14 LAB — CMP (CANCER CENTER ONLY)
ALT: 22 U/L (ref 0–44)
AST: 17 U/L (ref 15–41)
Albumin: 3.2 g/dL — ABNORMAL LOW (ref 3.5–5.0)
Alkaline Phosphatase: 74 U/L (ref 38–126)
Anion gap: 9 (ref 5–15)
BUN: 11 mg/dL (ref 8–23)
CO2: 33 mmol/L — ABNORMAL HIGH (ref 22–32)
Calcium: 8.6 mg/dL — ABNORMAL LOW (ref 8.9–10.3)
Chloride: 97 mmol/L — ABNORMAL LOW (ref 98–111)
Creatinine: 0.83 mg/dL (ref 0.61–1.24)
GFR, Estimated: >60 mL/min (ref >60)
Glucose, Bld: 171 mg/dL — ABNORMAL HIGH (ref 70–99)
Potassium: 2.5 mmol/L — CL (ref 3.5–5.1)
Sodium: 139 mmol/L (ref 135–145)
Total Bilirubin: 0.3 mg/dL (ref 0.3–1.2)
Total Protein: 6.9 g/dL (ref 6.5–8.1)

## 2022-10-14 LAB — CBC WITH DIFFERENTIAL (CANCER CENTER ONLY)
Abs Immature Granulocytes: 0.03 10*3/uL (ref 0.00–0.07)
Basophils Absolute: 0 10*3/uL (ref 0.0–0.1)
Basophils Relative: 1 %
Eosinophils Absolute: 0.1 10*3/uL (ref 0.0–0.5)
Eosinophils Relative: 2 %
HCT: 31.1 % — ABNORMAL LOW (ref 39.0–52.0)
Hemoglobin: 8.9 g/dL — ABNORMAL LOW (ref 13.0–17.0)
Immature Granulocytes: 1 %
Lymphocytes Relative: 23 %
Lymphs Abs: 1.4 10*3/uL (ref 0.7–4.0)
MCH: 21.3 pg — ABNORMAL LOW (ref 26.0–34.0)
MCHC: 28.6 g/dL — ABNORMAL LOW (ref 30.0–36.0)
MCV: 74.6 fL — ABNORMAL LOW (ref 80.0–100.0)
Monocytes Absolute: 0.7 10*3/uL (ref 0.1–1.0)
Monocytes Relative: 11 %
Neutro Abs: 3.9 10*3/uL (ref 1.7–7.7)
Neutrophils Relative %: 62 %
Platelet Count: 390 10*3/uL (ref 150–400)
RBC: 4.17 MIL/uL — ABNORMAL LOW (ref 4.22–5.81)
RDW: 20.8 % — ABNORMAL HIGH (ref 11.5–15.5)
WBC Count: 6.1 10*3/uL (ref 4.0–10.5)
nRBC: 0 % (ref 0.0–0.2)

## 2022-10-14 LAB — LACTATE DEHYDROGENASE: LDH: 136 U/L (ref 98–192)

## 2022-10-14 MED ORDER — POTASSIUM CHLORIDE CRYS ER 20 MEQ PO TBCR: 20.0000 meq | EXTENDED_RELEASE_TABLET | Freq: Two times a day (BID) | ORAL | 0 refills | Status: DC

## 2022-10-14 NOTE — Telephone Encounter (Signed)
CRITICAL VALUE STICKER  CRITICAL VALUE: POTASSIUM 2.5  RECEIVER (on-site recipient of call): Sayla Golonka P. LPN   DATE & TIME NOTIFIED: 8/28/ 250pm  MESSENGER (representative from lab): Mindi Junker   MD NOTIFIED: Dr. Arbutus Ped

## 2022-10-14 NOTE — Progress Notes (Signed)
[facility] [phone]   [phone]  OFFICE PROGRESS NOTE  [name], MD [address] [place] [address]  DIAGNOSIS: Stage IV clear-cell renal cell carcinoma diagnosed initially in 2016 as localized T1 a disease with evidence of metastatic disease to the liver and omentum in 2019.  PRIOR THERAPY: 1) status post radical right renal nephrectomy under the care of Dr. Marlou Porch on February 01, 2015 and the final pathology revealed 3.3 cm clear renal cell carcinoma with Fuhrman grade 3. 2) status post omental biopsy in July 2019 that confirmed the presence of recurrent metastatic renal cell carcinoma. 3) status post treatment with immunotherapy with ipilimumab 1 Mg/KG and nivolumab 3 mg/KG every 3 weeks started 09/21/2017 status post 4 cycles and the patient did not receive any additional immunotherapy secondary to adrenal insufficiency and panhypopituitarism 4) status post radiotherapy to the abdominal lymph node completed November 08, 2019. 5) Cabometyx initially started at 40 mg p.o. daily in March 2023 and reduced to 20 mg p.o. daily in April 2023.  This was discontinued in June 2024 secondary to disease progression.  CURRENT THERAPY: Axitinib 2 mg p.o. twice daily.  First dose started 07/29/2022.  INTERVAL HISTORY: [name] 77 y.o. male returns to clinic today for follow-up visit accompanied by his daughter, [name].  The patient continues to complain of fatigue and weakness as well as the weight loss.  He also has some soreness in the nasal area and left side of the face.  He denied having any current chest pain, shortness of breath, cough or hemoptysis.  He has no nausea, vomiting, diarrhea or constipation.  He has no headache or visual changes.  He continues to tolerate his treatment with axitinib fairly well.  He continues his treatment for the fungal infection under the care of the infectious disease team with posaconazole.  He  had CT scan of the maxillofacial on 09/03/2022 that showed no substantial changes in the circumferential soft tissue involving the left maxillary sinus including destruction of the posterior lateral wall left and involvement of adjacent left masticator space favoring chronic inflammatory/infectious process over malignancy.  The patient is here today for evaluation and repeat blood work.  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  MEDICATIONS:  Current Outpatient Medications  Medication Sig Dispense Refill   acetaminophen (TYLENOL) 500 MG tablet Take 1,000 mg by mouth 2 (two) times daily.     apixaban (ELIQUIS) 2.5 MG TABS tablet Take 1 tablet (2.5 mg total) by mouth 2 (two) times daily. 180 tablet 1   axitinib (INLYTA) 1 MG tablet Take 2 tablets (2 mg total) by mouth 2 (two) times daily. 120 tablet 0   diazepam (VALIUM) 2 MG tablet TAKE 1-2 TABLETS BY  MOUTH EVERY 8 HOURS AS NEEDED FOR ANXIETY 60 tablet 0   diltiazem (CARDIZEM CD) 240 MG 24 hr capsule Take 1 capsule (240 mg total) by mouth in the morning. 90 capsule 3   ezetimibe (ZETIA) 10 MG tablet Take 1 tablet (10 mg total) by mouth daily. (Patient taking differently: Take 10 mg by mouth 2 (two) times a week.) 90 tablet 3   fluticasone (FLONASE) 50 MCG/ACT nasal spray Place 2 sprays into both nostrils daily as needed for allergies.     hydrocortisone  (CORTEF) 5 MG tablet Take by mouth 15 mg in am and 5 mg in pm 400 tablet 3   levothyroxine (SYNTHROID) 150 MCG tablet Take 1 tablet (150 mcg total) by mouth daily before breakfast. 90 tablet 3   loratadine (CLARITIN) 10 MG tablet Take 10 mg by mouth daily as needed for allergies.     megestrol (MEGACE) 20 MG tablet Take 1 tablet (20 mg total) by mouth daily. 30 tablet 1   Multiple Vitamins-Minerals (CENTRUM SILVER 50+MEN) TABS Take 1 tablet by mouth daily with breakfast.     mupirocin ointment (BACTROBAN) 2 % Place 1 Application into the nose daily as needed (sores).     OLANZapine (ZYPREXA) 5 MG tablet Take 1 tablet (5 mg total) by mouth at bedtime. 90 tablet 0   posaconazole (NOXAFIL) 100 MG TBEC delayed-release tablet Take 3 tablets (300 mg total) by mouth daily. 42 tablet 0   potassium chloride SA (KLOR-CON M) 20 MEQ tablet Take 1 tablet (20 mEq total) by mouth daily. 14 tablet 0   traZODone (DESYREL) 150 MG tablet Take 150 mg by mouth at bedtime as needed for sleep.     VITAMIN D PO Take 1 capsule by mouth daily.     No current facility-administered medications for this visit.    SURGICAL HISTORY:  Past Surgical History:  Procedure Laterality Date   APPENDECTOMY  age 57   ARTERY BIOPSY Left 04/10/2022   Procedure: LEFT TEMPORAL ARTERY BIOPSY;  Surgeon: Chuck Hint, MD;  Location: [facility] OR;  Service: Vascular;  Laterality: Left;   CHOLECYSTECTOMY  10/18/1978   EYE SURGERY Bilateral 2019   cataract   LAPAROSCOPIC LYSIS OF ADHESIONS N/A 08/01/2015   Procedure: LAPAROSCOPIC LYSIS OF ADHESIONS;  Surgeon: Karie Soda, MD;  Location: [facility] ORS;  Service: General;  Laterality: N/A;   LUNG LOBECTOMY  02/16/1990   lung cancer- patient has staples in lung not to have MRI per patient    MAXILLARY ANTROSTOMY Left 05/22/2022   Procedure: MAXILLARY ANTROSTOMY;  Surgeon: Laren Boom, DO;  Location: [facility] OR;  Service: ENT;  Laterality: Left;   PILONIDAL CYST EXCISION  02/16/2009   Dr  Gerrit Friends   ROBOTIC ASSITED PARTIAL NEPHRECTOMY Right 02/01/2015   Procedure: RIGHT ROBOTIC ASSISTED LAPAROSOCOPY NEPHRECTOMY;  Surgeon: Crist Fat, MD;  Location: [facility] ORS;  Service: Urology;  Laterality: Right;   SINUS ENDO WITH FUSION Left 05/22/2022   Procedure: SINUS ENDO WITH FUSION;  Surgeon: Laren Boom, DO;  Location: [facility] OR;  Service: ENT;  Laterality: Left;   VENTRAL HERNIA REPAIR N/A 08/01/2015   Procedure: LAPAROSCOPIC VENTRAL WALL HERNIA WITH MESH;  Surgeon: Karie Soda, MD;  Location: [facility] ORS;  Service: General;  Laterality: N/A;    REVIEW OF SYSTEMS:  Constitutional: positive for anorexia, fatigue, and weight loss Eyes: negative Ears, nose, mouth, throat, and face: positive for nasal sores Respiratory: negative Cardiovascular: negative Gastrointestinal: negative Genitourinary:negative Integument/breast: negative Hematologic/lymphatic: negative Musculoskeletal:negative Neurological: negative  Behavioral/Psych: negative Endocrine: negative Allergic/Immunologic: negative   PHYSICAL EXAMINATION: General appearance: alert, cooperative, fatigued, and no distress Head: Normocephalic, without obvious abnormality, atraumatic Neck: no adenopathy, no JVD, supple, symmetrical, trachea midline, and thyroid not enlarged, symmetric, no tenderness/mass/nodules Lymph nodes: Cervical, supraclavicular, and axillary nodes normal. Resp: clear to auscultation bilaterally Back: symmetric, no curvature. ROM normal. No CVA tenderness. Cardio: regular rate and rhythm, S1, S2 normal, no murmur, click, rub or gallop GI: soft, non-tender; bowel sounds normal; no masses,  no organomegaly Extremities: extremities normal, atraumatic, no cyanosis or edema Neurologic: Alert and oriented X 3, normal strength and tone. Normal symmetric reflexes. Normal coordination and gait  ECOG PERFORMANCE STATUS: 1 - Symptomatic but completely ambulatory  Blood pressure (!) 160/80, pulse 72, temperature  98.3 F (36.8 C), resp. rate 16, height 5\' 8"  (1.727 m), weight 153 lb (69.4 kg), SpO2 99%.  LABORATORY DATA: Lab Results  Component Value Date   WBC 6.1 10/14/2022   HGB 8.9 (L) 10/14/2022   HCT 31.1 (L) 10/14/2022   MCV 74.6 (L) 10/14/2022   PLT 390 10/14/2022      Chemistry      Component Value Date/Time   NA 141 10/12/2022 1022   NA 141 08/11/2021 1252   K 2.9 (L) 10/12/2022 1022   CL 97 (L) 10/12/2022 1022   CO2 31 10/12/2022 1022   BUN 12 10/12/2022 1022   BUN 14 08/11/2021 1252   CREATININE 0.97 10/12/2022 1022      Component Value Date/Time   CALCIUM 8.8 10/12/2022 1022   ALKPHOS 96 09/10/2022 0754   AST 20 10/12/2022 1022   AST 21 09/10/2022 0754   ALT 23 10/12/2022 1022   ALT 36 09/10/2022 0754   BILITOT 0.3 10/12/2022 1022   BILITOT 0.4 09/10/2022 0754       RADIOGRAPHIC STUDIES: No results found.  ASSESSMENT AND PLAN: This is a very pleasant 77 years old white male with Stage IV clear-cell renal cell carcinoma diagnosed initially in 2016 as localized T1 a disease with evidence of metastatic disease to the liver and omentum in 2019. The patient underwent the following treatment: 1) status post radical right renal nephrectomy under the care of Dr. Marlou Porch on February 01, 2015 and the final pathology revealed 3.3 cm clear renal cell carcinoma with Fuhrman grade 3. 2) status post omental biopsy in July 2019 that confirmed the presence of recurrent metastatic renal cell carcinoma. 3) status post treatment with immunotherapy with ipilimumab 1 Mg/KG and nivolumab 3 mg/KG every 3 weeks started 09/21/2017 status post 4 cycles and the patient did not receive any additional immunotherapy secondary to adrenal insufficiency and panhypopituitarism 4) status post radiotherapy to the abdominal lymph node completed November 08, 2019. He was on treatment with Cabometyx 20 mg p.o. daily started March 2023.  His treatment has been on hold for the last 2 months because of the  recent sinus issues and his final diagnosis was fungal sinusitis and currently on treatment with voriconazole by infectious disease.  He was supposed to be holding his treatment with Cabometyx but his daughter mentioned that the patient has been taking it regularly and held it only for like 2 weeks. Unfortunately his scan showed evidence for disease progression in the abdomen including the pancreatic mass as well as peripancreatic and retroperitoneal lymphadenopathy as well as liver lesions. I recommended for the patient to discontinue his current treatment with Cabometyx at this point. We considered the patient for treatment with axitinib initially at 5 mg  p.o. twice daily but because of drug interaction with posaconazole, we will reduce his dose of treatment to 2 mg p.o. twice daily for now and may consider increase it in the future once he is done with the antifungal treatment. The patient continues to tolerate his treatment with axitinib fairly well with no concerning adverse effects. I recommended for him to continue his current treatment with the same dose for now. For the anemia he will continue on the oral iron tablet with vitamin C.  He also received iron infusion recently. For the maxillary pain, it has improved and he will continue with his current treatment by the palliative care team. For the suspicious maxillary fungal infection, the patient is currently on treatment with posaconazole and followed by infectious disease. I will see him back for follow-up visit in 1 months for evaluation with repeat CT scan of the chest, abdomen and pelvis for restaging of his disease. He was advised to call immediately if he has any other concerning symptoms in the interval. The patient voices understanding of current disease status and treatment options and is in agreement with the current care plan.  All questions were answered. The patient knows to call the clinic with any problems, questions or  concerns. We can certainly see the patient much sooner if necessary. The total time spent in the appointment was 30 minutes.  Disclaimer: This note was dictated with voice recognition software. Similar sounding words can inadvertently be transcribed and may not be corrected upon review.

## 2022-10-14 NOTE — Telephone Encounter (Signed)
This nurse reached out to patient and spoke with daughter [name],  made her aware that the provider has reviewed lab results and would like them to know that his potassium is very low and I did send potassium chloride to his pharmacy to start today 2 tablet every day for the next 10 days.

## 2022-10-20 ENCOUNTER — Other Ambulatory Visit: Payer: Self-pay

## 2022-10-20 ENCOUNTER — Telehealth: Payer: Self-pay

## 2022-10-20 ENCOUNTER — Inpatient Hospital Stay: Payer: Medicare HMO | Attending: Oncology

## 2022-10-20 ENCOUNTER — Other Ambulatory Visit: Payer: Self-pay | Admitting: Physician Assistant

## 2022-10-20 DIAGNOSIS — D649 Anemia, unspecified: Secondary | ICD-10-CM | POA: Insufficient documentation

## 2022-10-20 DIAGNOSIS — C786 Secondary malignant neoplasm of retroperitoneum and peritoneum: Secondary | ICD-10-CM | POA: Insufficient documentation

## 2022-10-20 DIAGNOSIS — Z905 Acquired absence of kidney: Secondary | ICD-10-CM | POA: Diagnosis not present

## 2022-10-20 DIAGNOSIS — C787 Secondary malignant neoplasm of liver and intrahepatic bile duct: Secondary | ICD-10-CM | POA: Diagnosis not present

## 2022-10-20 DIAGNOSIS — B49 Unspecified mycosis: Secondary | ICD-10-CM | POA: Diagnosis not present

## 2022-10-20 DIAGNOSIS — K869 Disease of pancreas, unspecified: Secondary | ICD-10-CM | POA: Insufficient documentation

## 2022-10-20 DIAGNOSIS — E876 Hypokalemia: Secondary | ICD-10-CM | POA: Insufficient documentation

## 2022-10-20 DIAGNOSIS — R6884 Jaw pain: Secondary | ICD-10-CM | POA: Diagnosis not present

## 2022-10-20 DIAGNOSIS — E860 Dehydration: Secondary | ICD-10-CM

## 2022-10-20 DIAGNOSIS — C641 Malignant neoplasm of right kidney, except renal pelvis: Secondary | ICD-10-CM | POA: Insufficient documentation

## 2022-10-20 MED ORDER — SODIUM CHLORIDE 0.9 % IV SOLN: Freq: Once | INTRAVENOUS | Status: AC

## 2022-10-20 NOTE — Telephone Encounter (Signed)
TC from pt's daughter [name] this AM to request IVF for patient due to increasing weakness over the last few days. Spoke w/ patient who reports that he has not been taking in POs well x 2-3 days, although he denies N/V. He is urinating, but not as often as normal. Discussed w/ C. Heilingoetter, PA; pt to come in for IVF--daughter states she will have pt here by 12:45 today.

## 2022-10-20 NOTE — Patient Instructions (Signed)

## 2022-10-21 ENCOUNTER — Other Ambulatory Visit: Payer: Self-pay | Admitting: Physician Assistant

## 2022-10-21 ENCOUNTER — Other Ambulatory Visit: Payer: Self-pay | Admitting: Nurse Practitioner

## 2022-10-21 ENCOUNTER — Other Ambulatory Visit (HOSPITAL_COMMUNITY): Payer: Self-pay

## 2022-10-21 DIAGNOSIS — C649 Malignant neoplasm of unspecified kidney, except renal pelvis: Secondary | ICD-10-CM

## 2022-10-21 DIAGNOSIS — R630 Anorexia: Secondary | ICD-10-CM

## 2022-10-21 DIAGNOSIS — F419 Anxiety disorder, unspecified: Secondary | ICD-10-CM

## 2022-10-21 DIAGNOSIS — R634 Abnormal weight loss: Secondary | ICD-10-CM

## 2022-10-22 ENCOUNTER — Other Ambulatory Visit: Payer: Self-pay

## 2022-10-22 MED ORDER — POSACONAZOLE 100 MG PO TBEC: 300.0000 mg | DELAYED_RELEASE_TABLET | Freq: Every day | ORAL | 2 refills | Status: DC

## 2022-10-26 ENCOUNTER — Other Ambulatory Visit: Payer: Self-pay | Admitting: Internal Medicine

## 2022-10-26 ENCOUNTER — Other Ambulatory Visit: Payer: Self-pay

## 2022-10-26 ENCOUNTER — Other Ambulatory Visit (HOSPITAL_COMMUNITY): Payer: Self-pay

## 2022-10-26 DIAGNOSIS — C649 Malignant neoplasm of unspecified kidney, except renal pelvis: Secondary | ICD-10-CM

## 2022-10-26 MED ORDER — AXITINIB 1 MG PO TABS
2.0000 mg | ORAL_TABLET | Freq: Two times a day (BID) | ORAL | 0 refills | Status: DC
  Filled 2022-10-26: qty 120, 30d supply, fill #0

## 2022-10-27 ENCOUNTER — Other Ambulatory Visit: Payer: Self-pay

## 2022-11-04 ENCOUNTER — Telehealth: Payer: Self-pay | Admitting: Medical Oncology

## 2022-11-04 ENCOUNTER — Inpatient Hospital Stay: Payer: Medicare HMO

## 2022-11-04 ENCOUNTER — Other Ambulatory Visit: Payer: Self-pay | Admitting: Internal Medicine

## 2022-11-04 ENCOUNTER — Other Ambulatory Visit: Payer: Self-pay | Admitting: Medical Oncology

## 2022-11-04 ENCOUNTER — Other Ambulatory Visit: Payer: Self-pay | Admitting: Physician Assistant

## 2022-11-04 ENCOUNTER — Ambulatory Visit (HOSPITAL_COMMUNITY)
Admission: RE | Admit: 2022-11-04 | Discharge: 2022-11-04 | Disposition: A | Discharge status: Home / Self Care | Payer: Medicare HMO | Source: Ambulatory Visit | Attending: Internal Medicine | Admitting: Internal Medicine

## 2022-11-04 DIAGNOSIS — E876 Hypokalemia: Secondary | ICD-10-CM

## 2022-11-04 DIAGNOSIS — K8689 Other specified diseases of pancreas: Secondary | ICD-10-CM | POA: Diagnosis not present

## 2022-11-04 DIAGNOSIS — C641 Malignant neoplasm of right kidney, except renal pelvis: Secondary | ICD-10-CM

## 2022-11-04 DIAGNOSIS — K5730 Diverticulosis of large intestine without perforation or abscess without bleeding: Secondary | ICD-10-CM | POA: Diagnosis not present

## 2022-11-04 DIAGNOSIS — C649 Malignant neoplasm of unspecified kidney, except renal pelvis: Secondary | ICD-10-CM | POA: Diagnosis not present

## 2022-11-04 DIAGNOSIS — C787 Secondary malignant neoplasm of liver and intrahepatic bile duct: Secondary | ICD-10-CM | POA: Diagnosis not present

## 2022-11-04 LAB — CBC WITH DIFFERENTIAL (CANCER CENTER ONLY)
Abs Immature Granulocytes: 0.03 10*3/uL (ref 0.00–0.07)
Basophils Absolute: 0 10*3/uL (ref 0.0–0.1)
Basophils Relative: 0 %
Eosinophils Absolute: 0.1 10*3/uL (ref 0.0–0.5)
Eosinophils Relative: 1 %
HCT: 30.4 % — ABNORMAL LOW (ref 39.0–52.0)
Hemoglobin: 8.9 g/dL — ABNORMAL LOW (ref 13.0–17.0)
Immature Granulocytes: 0 %
Lymphocytes Relative: 12 %
Lymphs Abs: 1.3 10*3/uL (ref 0.7–4.0)
MCH: 21.8 pg — ABNORMAL LOW (ref 26.0–34.0)
MCHC: 29.3 g/dL — ABNORMAL LOW (ref 30.0–36.0)
MCV: 74.5 fL — ABNORMAL LOW (ref 80.0–100.0)
Monocytes Absolute: 0.9 10*3/uL (ref 0.1–1.0)
Monocytes Relative: 8 %
Neutro Abs: 8.3 10*3/uL — ABNORMAL HIGH (ref 1.7–7.7)
Neutrophils Relative %: 79 %
Platelet Count: 404 10*3/uL — ABNORMAL HIGH (ref 150–400)
RBC: 4.08 MIL/uL — ABNORMAL LOW (ref 4.22–5.81)
RDW: 22.5 % — ABNORMAL HIGH (ref 11.5–15.5)
WBC Count: 10.6 10*3/uL — ABNORMAL HIGH (ref 4.0–10.5)
nRBC: 0 % (ref 0.0–0.2)

## 2022-11-04 LAB — CMP (CANCER CENTER ONLY)
ALT: 25 U/L (ref 0–44)
AST: 17 U/L (ref 15–41)
Albumin: 3.1 g/dL — ABNORMAL LOW (ref 3.5–5.0)
Alkaline Phosphatase: 134 U/L — ABNORMAL HIGH (ref 38–126)
Anion gap: 8 (ref 5–15)
BUN: 16 mg/dL (ref 8–23)
CO2: 32 mmol/L (ref 22–32)
Calcium: 9 mg/dL (ref 8.9–10.3)
Chloride: 98 mmol/L (ref 98–111)
Creatinine: 0.83 mg/dL (ref 0.61–1.24)
GFR, Estimated: >60 mL/min (ref >60)
Glucose, Bld: 151 mg/dL — ABNORMAL HIGH (ref 70–99)
Potassium: 2.7 mmol/L — CL (ref 3.5–5.1)
Sodium: 138 mmol/L (ref 135–145)
Total Bilirubin: 0.3 mg/dL (ref 0.3–1.2)
Total Protein: 6.9 g/dL (ref 6.5–8.1)

## 2022-11-04 MED ORDER — POTASSIUM CHLORIDE CRYS ER 20 MEQ PO TBCR: 20.0000 meq | EXTENDED_RELEASE_TABLET | Freq: Two times a day (BID) | ORAL | 0 refills | Status: DC

## 2022-11-04 MED ORDER — POTASSIUM CHLORIDE CRYS ER 20 MEQ PO TBCR
20.0000 meq | EXTENDED_RELEASE_TABLET | Freq: Two times a day (BID) | ORAL | 0 refills | Status: DC
Start: 2022-11-04 — End: 2022-11-04

## 2022-11-04 NOTE — Telephone Encounter (Signed)
CRITICAL VALUE STICKER  CRITICAL VALUE: K= =2.7  RECEIVER (on-site recipient of call):[name]  DATE & TIME NOTIFIED: 11/04/22 @ 1115  MESSENGER (representative from lab):Pam  MD NOTIFIED: Heilingoetter  TIME OF NOTIFICATION1120  RESPONSE:  K= rx sent to pharmacy and IV K+ tomorrow.

## 2022-11-05 ENCOUNTER — Other Ambulatory Visit: Payer: Self-pay | Admitting: Physician Assistant

## 2022-11-05 ENCOUNTER — Telehealth: Payer: Self-pay | Admitting: Medical Oncology

## 2022-11-05 DIAGNOSIS — E876 Hypokalemia: Secondary | ICD-10-CM

## 2022-11-05 NOTE — Telephone Encounter (Signed)
Dtr notified of lab appt next Thursday.

## 2022-11-06 ENCOUNTER — Other Ambulatory Visit: Payer: Self-pay | Admitting: Physician Assistant

## 2022-11-06 ENCOUNTER — Inpatient Hospital Stay: Payer: Medicare HMO

## 2022-11-06 DIAGNOSIS — Z905 Acquired absence of kidney: Secondary | ICD-10-CM | POA: Diagnosis not present

## 2022-11-06 DIAGNOSIS — K869 Disease of pancreas, unspecified: Secondary | ICD-10-CM | POA: Diagnosis not present

## 2022-11-06 DIAGNOSIS — E876 Hypokalemia: Secondary | ICD-10-CM

## 2022-11-06 DIAGNOSIS — D649 Anemia, unspecified: Secondary | ICD-10-CM | POA: Diagnosis not present

## 2022-11-06 DIAGNOSIS — R6884 Jaw pain: Secondary | ICD-10-CM

## 2022-11-06 DIAGNOSIS — C787 Secondary malignant neoplasm of liver and intrahepatic bile duct: Secondary | ICD-10-CM | POA: Diagnosis not present

## 2022-11-06 DIAGNOSIS — B49 Unspecified mycosis: Secondary | ICD-10-CM | POA: Diagnosis not present

## 2022-11-06 DIAGNOSIS — R638 Other symptoms and signs concerning food and fluid intake: Secondary | ICD-10-CM

## 2022-11-06 DIAGNOSIS — C641 Malignant neoplasm of right kidney, except renal pelvis: Secondary | ICD-10-CM | POA: Diagnosis not present

## 2022-11-06 DIAGNOSIS — C786 Secondary malignant neoplasm of retroperitoneum and peritoneum: Secondary | ICD-10-CM | POA: Diagnosis not present

## 2022-11-06 MED ORDER — SODIUM CHLORIDE 0.9 % IV SOLN: Freq: Once | INTRAVENOUS | Status: AC

## 2022-11-06 MED ORDER — SODIUM CHLORIDE 0.9 % IV SOLN: INTRAVENOUS | Status: DC

## 2022-11-06 MED ORDER — POTASSIUM CHLORIDE 20 MEQ/15ML (10%) PO SOLN: 20.0000 meq | Freq: Two times a day (BID) | ORAL | 0 refills | Status: DC

## 2022-11-06 MED ORDER — POTASSIUM CHLORIDE 10 MEQ/100ML IV SOLN
10.0000 meq | INTRAVENOUS | Status: AC
  Administered 2022-11-06 (×3): 10 meq via INTRAVENOUS
  Filled 2022-11-06: qty 100

## 2022-11-06 NOTE — Patient Instructions (Signed)
Hypokalemia Hypokalemia means that the amount of potassium in the blood is lower than normal. Potassium is a mineral (electrolyte) that helps regulate the amount of fluid in the body. It also stimulates muscle tightening (contraction) and helps nerves work properly. Normally, most of the body's potassium is inside cells, and only a very small amount is in the blood. Because the amount in the blood is so small, minor changes to potassium levels in the blood can be life-threatening. What are the causes? This condition may be caused by: Antibiotic medicine. Diarrhea or vomiting. Taking too much of a medicine that helps you have a bowel movement (laxative) can cause diarrhea and lead to hypokalemia. Chronic kidney disease (CKD). Medicines that help the body get rid of excess fluid (diuretics). Eating disorders, such as anorexia or bulimia. Low magnesium levels in the body. Sweating a lot. What are the signs or symptoms? Symptoms of this condition include: Weakness. Constipation. Fatigue. Muscle cramps. Mental confusion. Skipped heartbeats or irregular heartbeat (palpitations). Tingling or numbness. How is this diagnosed? This condition is diagnosed with a blood test. How is this treated? This condition may be treated by: Taking potassium supplements. Adjusting the medicines that you take. Eating more foods that contain a lot of potassium. If your potassium level is very low, you may need to get potassium through an IV and be monitored in the hospital. Follow these instructions at home: Eating and drinking  Eat a healthy diet. A healthy diet includes fresh fruits and vegetables, whole grains, healthy fats, and lean proteins. If told, eat more foods that contain a lot of potassium. These include: Nuts, such as peanuts and pistachios. Seeds, such as sunflower seeds and pumpkin seeds. Peas, lentils, and lima beans. Whole grain and bran cereals and breads. Fresh fruits and vegetables,  such as apricots, avocado, bananas, cantaloupe, kiwi, oranges, tomatoes, asparagus, and potatoes. Juices, such as orange, tomato, and prune. Lean meats, including fish. Milk and milk products, such as yogurt. General instructions Take over-the-counter and prescription medicines only as told by your health care provider. This includes vitamins, natural food products, and supplements. Keep all follow-up visits. This is important. Contact a health care provider if: You have weakness that gets worse. You feel your heart pounding or racing. You vomit. You have diarrhea. You have diabetes and you have trouble keeping your blood sugar in your target range. Get help right away if: You have chest pain. You have shortness of breath. You have vomiting or diarrhea that lasts for more than 2 days. You faint. These symptoms may be an emergency. Get help right away. Call 911. Do not wait to see if the symptoms will go away. Do not drive yourself to the hospital. Summary Hypokalemia means that the amount of potassium in the blood is lower than normal. This condition is diagnosed with a blood test. Hypokalemia may be treated by taking potassium supplements, adjusting the medicines that you take, or eating more foods that are high in potassium. If your potassium level is very low, you may need to get potassium through an IV and be monitored in the hospital. This information is not intended to replace advice given to you by your health care provider. Make sure you discuss any questions you have with your health care provider. Document Revised: 10/17/2020 Document Reviewed: 10/17/2020 Elsevier Patient Education  2024 [employer].

## 2022-11-06 NOTE — Progress Notes (Signed)
Sent liquid rx for potassium. Received message he has a hard time swallowing the tablets.

## 2022-11-06 NOTE — Progress Notes (Signed)
Patient states he has not been able to eat and drink well. Cassandra Heilingoetter, PA notified. Order given and carried out for 1 liter normal saline over 2 hours.

## 2022-11-09 NOTE — Progress Notes (Unsigned)
Palliative Medicine [facility]  Telephone:([phone]) [phone] Fax:(336) [phone]   Name: [name] Date: 11/09/2022 MRN: [id number]  DOB: 02-07-1946  Patient Care Team: [name], MD as PCP - General (Family Medicine) Thurmon Fair, MD as PCP - Cardiology (Cardiology) Karie Soda, MD as Consulting Physician (General Surgery) Crist Fat, MD as Consulting Physician (Urology)    INTERVAL HISTORY: [name] is a 77 y.o. 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 Palliative ask to see for symptom and pain management and goals of care.   SOCIAL HISTORY:    [name] reports that he quit smoking about 34 years ago. His smoking use included cigarettes. He started smoking about 54 years ago. He has a 20 pack-year smoking history. He has never used smokeless tobacco. He reports that he does not currently use alcohol. He reports that he does not use drugs.  ADVANCE DIRECTIVES:  None on file CODE STATUS: Full code  PAST MEDICAL HISTORY: Past Medical History:  Diagnosis Date   Arthritis    Chronic kidney disease    only has one kidney    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 02/01/2015   Coronary artery disease    followed by Dr.Tilley   Frequent PVCs    GERD (gastroesophageal reflux disease)    Heart murmur    never has caused any problems   Hx of cancer of lung 1980's   Hyperlipidemia    Hypertension    Hypothyroidism    Incisional hernia 08/01/2015   Lung cancer (HCC) 1993   Lung metastases 2019   Pneumonia    x several   Recurrent umbilical hernia 08/01/2015    ALLERGIES:  is allergic to diphenhydramine.  MEDICATIONS:  Current Outpatient Medications  Medication Sig Dispense Refill   acetaminophen (TYLENOL) 500 MG tablet Take 1,000 mg by mouth 2 (two) times daily.      apixaban (ELIQUIS) 2.5 MG TABS tablet Take 1 tablet (2.5 mg total) by mouth 2 (two) times daily. 180 tablet 1   axitinib (INLYTA) 1 MG tablet Take 2 tablets (2 mg total) by mouth 2 (two) times daily. 120 tablet 0   diazepam (VALIUM) 2 MG tablet TAKE 1-2 TABLETS BY MOUTH EVERY 8 HOURS AS NEEDED FOR ANXIETY 60 tablet 0   diltiazem (CARDIZEM CD) 240 MG 24 hr capsule Take 1 capsule (240 mg total) by mouth in the morning. 90 capsule 3   ezetimibe (ZETIA) 10 MG tablet Take 1 tablet (10 mg total) by mouth daily. (Patient taking differently: Take 10 mg by mouth 2 (two) times a week.) 90 tablet 3   famotidine (PEPCID) 20 MG tablet Take 20 mg by mouth 2 (two) times daily as needed.     fluticasone (FLONASE) 50 MCG/ACT nasal spray Place 2 sprays into both nostrils daily as needed for allergies.     hydrocortisone (CORTEF) 5 MG tablet Take by mouth 15 mg in am and 5 mg in pm 400 tablet 3   levothyroxine (SYNTHROID) 150 MCG tablet Take 1 tablet (150 mcg total) by mouth daily before breakfast. 90 tablet 3   loratadine (CLARITIN) 10 MG tablet Take 10 mg by mouth daily as needed for allergies.     megestrol (MEGACE) 20 MG tablet TAKE 1 TABLET BY MOUTH DAILY 30 tablet 1   Multiple Vitamins-Minerals (CENTRUM SILVER 50+MEN) TABS Take 1  tablet by mouth daily with breakfast.     mupirocin ointment (BACTROBAN) 2 % Place 1 Application into the nose daily as needed (sores).     OLANZapine (ZYPREXA) 5 MG tablet Take 1 tablet (5 mg total) by mouth at bedtime. 90 tablet 0   posaconazole (NOXAFIL) 100 MG TBEC delayed-release tablet Take 3 tablets (300 mg total) by mouth daily. 42 tablet 2   potassium chloride 20 MEQ/15ML (10%) SOLN Take 15 mLs (20 mEq total) by mouth 2 (two) times daily. 240 mL 0   traZODone (DESYREL) 150 MG tablet Take 150 mg by mouth at bedtime as needed for sleep.     VITAMIN D PO Take 1 capsule by mouth daily.     No current facility-administered medications for this visit.    VITAL SIGNS: There  were no vitals taken for this visit. There were no vitals filed for this visit.  Estimated body mass index is 23.26 kg/m as calculated from the following:   Height as of 10/14/22: 5\' 8"  (1.727 m).   Weight as of 10/14/22: 153 lb (69.4 kg).     Latest Ref Rng & Units 11/04/2022   10:18 AM 10/14/2022    1:23 PM 10/12/2022   10:22 AM  CBC  WBC 4.0 - 10.5 K/uL 10.6  6.1  7.5   Hemoglobin 13.0 - 17.0 g/dL 8.9  8.9  9.4   Hematocrit 39.0 - 52.0 % 30.4  31.1  32.6   Platelets 150 - 400 K/uL 404  390  423        Latest Ref Rng & Units 11/04/2022   10:18 AM 10/14/2022    1:23 PM 10/12/2022   10:22 AM  CMP  Glucose 70 - 99 mg/dL 413  244  010   BUN 8 - 23 mg/dL 16  11  12    Creatinine 0.61 - 1.24 mg/dL 2.72  5.36  6.44   Sodium 135 - 145 mmol/L 138  139  141   Potassium 3.5 - 5.1 mmol/L 2.7  2.5  2.9   Chloride 98 - 111 mmol/L 98  97  97   CO2 22 - 32 mmol/L 32  33  31   Calcium 8.9 - 10.3 mg/dL 9.0  8.6  8.8   Total Protein 6.5 - 8.1 g/dL 6.9  6.9  6.4   Total Bilirubin 0.3 - 1.2 mg/dL 0.3  0.3  0.3   Alkaline Phos 38 - 126 U/L 134  74    AST 15 - 41 U/L 17  17  20    ALT 0 - 44 U/L 25  22  23       PERFORMANCE STATUS (ECOG) : 1 - Symptomatic but completely ambulatory  Assessment NAD, in wheelchair RRR Normal breathing pattern AAO x4  IMPRESSION: Patient presents to the clinic for follow-up.  No acute distress noted.  Daughter is present.  Continues to have ongoing fatigue.  Denies nausea, vomiting,  constipation, or diarrhea.  Shares recent scans identified cancer progression which was discussed with Dr. Shirline Frees during his visit today.  Daughter reports plans on following up with [facility] for second opinion and additional options.  Continuing to take things one day at a time.  Patient and family knows we are available to support as needed. Their goal is to continue to allow him every opportunity to thrive for as long as he can.    PLAN:  Valium 2 mg as needed for anxiety(does not  require daily) Ongoing symptom management and goals  of care support. Palliative will plan to see patient back in 4-6 weeks in collaboration to other oncology appointments.  Patient knows to contact our office if needed sooner.  Patient expressed understanding and was in agreement with this plan. He also understands that He can call the clinic at any time with any questions, concerns, or complaints.   Any controlled substances utilized were prescribed in the context of palliative care. PDMP has been reviewed.   Visit consisted of counseling and education dealing with the complex and emotionally intense issues of symptom management and palliative care in the setting of serious and potentially life-threatening illness.Greater than 50%  of this time was spent counseling and coordinating care related to the above assessment and plan.   Willette Alma, AGPCNP-BC  Palliative Medicine Team/[facility]  *Please note that this is a verbal dictation therefore any spelling or grammatical errors are due to the "Dragon Medical One" system interpretation.

## 2022-11-12 ENCOUNTER — Inpatient Hospital Stay: Payer: Medicare HMO | Admitting: Internal Medicine

## 2022-11-12 ENCOUNTER — Inpatient Hospital Stay: Payer: Medicare HMO

## 2022-11-12 ENCOUNTER — Encounter: Payer: Self-pay | Admitting: Nurse Practitioner

## 2022-11-12 ENCOUNTER — Inpatient Hospital Stay (HOSPITAL_BASED_OUTPATIENT_CLINIC_OR_DEPARTMENT_OTHER): Payer: Medicare HMO | Admitting: Nurse Practitioner

## 2022-11-12 ENCOUNTER — Telehealth: Payer: Self-pay | Admitting: Medical Oncology

## 2022-11-12 ENCOUNTER — Other Ambulatory Visit: Payer: Self-pay | Admitting: Medical Oncology

## 2022-11-12 VITALS — BP 138/78 | HR 81 | Temp 98.0°F | Resp 16 | Ht 68.0 in | Wt 149.2 lb

## 2022-11-12 DIAGNOSIS — C649 Malignant neoplasm of unspecified kidney, except renal pelvis: Secondary | ICD-10-CM | POA: Diagnosis not present

## 2022-11-12 DIAGNOSIS — R6884 Jaw pain: Secondary | ICD-10-CM | POA: Diagnosis not present

## 2022-11-12 DIAGNOSIS — C641 Malignant neoplasm of right kidney, except renal pelvis: Secondary | ICD-10-CM

## 2022-11-12 DIAGNOSIS — R530 Neoplastic (malignant) related fatigue: Secondary | ICD-10-CM | POA: Diagnosis not present

## 2022-11-12 DIAGNOSIS — B49 Unspecified mycosis: Secondary | ICD-10-CM | POA: Diagnosis not present

## 2022-11-12 DIAGNOSIS — C787 Secondary malignant neoplasm of liver and intrahepatic bile duct: Secondary | ICD-10-CM | POA: Diagnosis not present

## 2022-11-12 DIAGNOSIS — D638 Anemia in other chronic diseases classified elsewhere: Secondary | ICD-10-CM

## 2022-11-12 DIAGNOSIS — C786 Secondary malignant neoplasm of retroperitoneum and peritoneum: Secondary | ICD-10-CM | POA: Diagnosis not present

## 2022-11-12 DIAGNOSIS — Z905 Acquired absence of kidney: Secondary | ICD-10-CM | POA: Diagnosis not present

## 2022-11-12 DIAGNOSIS — Z515 Encounter for palliative care: Secondary | ICD-10-CM | POA: Diagnosis not present

## 2022-11-12 DIAGNOSIS — E876 Hypokalemia: Secondary | ICD-10-CM | POA: Diagnosis not present

## 2022-11-12 DIAGNOSIS — D649 Anemia, unspecified: Secondary | ICD-10-CM | POA: Diagnosis not present

## 2022-11-12 DIAGNOSIS — K869 Disease of pancreas, unspecified: Secondary | ICD-10-CM | POA: Diagnosis not present

## 2022-11-12 LAB — CBC WITH DIFFERENTIAL/PLATELET
Abs Immature Granulocytes: 0.07 10*3/uL (ref 0.00–0.07)
Basophils Absolute: 0 10*3/uL (ref 0.0–0.1)
Basophils Relative: 0 %
Eosinophils Absolute: 0 10*3/uL (ref 0.0–0.5)
Eosinophils Relative: 0 %
HCT: 31 % — ABNORMAL LOW (ref 39.0–52.0)
Hemoglobin: 9 g/dL — ABNORMAL LOW (ref 13.0–17.0)
Immature Granulocytes: 1 %
Lymphocytes Relative: 8 %
Lymphs Abs: 0.8 10*3/uL (ref 0.7–4.0)
MCH: 21.8 pg — ABNORMAL LOW (ref 26.0–34.0)
MCHC: 29 g/dL — ABNORMAL LOW (ref 30.0–36.0)
MCV: 75.2 fL — ABNORMAL LOW (ref 80.0–100.0)
Monocytes Absolute: 0.8 10*3/uL (ref 0.1–1.0)
Monocytes Relative: 8 %
Neutro Abs: 8.3 10*3/uL — ABNORMAL HIGH (ref 1.7–7.7)
Neutrophils Relative %: 83 %
Platelets: 452 10*3/uL — ABNORMAL HIGH (ref 150–400)
RBC: 4.12 MIL/uL — ABNORMAL LOW (ref 4.22–5.81)
RDW: 21.9 % — ABNORMAL HIGH (ref 11.5–15.5)
WBC: 10 10*3/uL (ref 4.0–10.5)
nRBC: 0 % (ref 0.0–0.2)

## 2022-11-12 LAB — COMPREHENSIVE METABOLIC PANEL
ALT: 30 U/L (ref 0–44)
AST: 21 U/L (ref 15–41)
Albumin: 3.1 g/dL — ABNORMAL LOW (ref 3.5–5.0)
Alkaline Phosphatase: 95 U/L (ref 38–126)
Anion gap: 9 (ref 5–15)
BUN: 11 mg/dL (ref 8–23)
CO2: 30 mmol/L (ref 22–32)
Calcium: 8.7 mg/dL — ABNORMAL LOW (ref 8.9–10.3)
Chloride: 99 mmol/L (ref 98–111)
Creatinine, Ser: 0.72 mg/dL (ref 0.61–1.24)
GFR, Estimated: >60 mL/min (ref >60)
Glucose, Bld: 171 mg/dL — ABNORMAL HIGH (ref 70–99)
Potassium: 3.1 mmol/L — ABNORMAL LOW (ref 3.5–5.1)
Sodium: 138 mmol/L (ref 135–145)
Total Bilirubin: 0.4 mg/dL (ref 0.3–1.2)
Total Protein: 7 g/dL (ref 6.5–8.1)

## 2022-11-12 LAB — SAMPLE TO BLOOD BANK

## 2022-11-12 NOTE — Telephone Encounter (Signed)
Spoke to [employer] coordinator at [facility] and records faxed.

## 2022-11-12 NOTE — Telephone Encounter (Signed)
LVM to return  my call to refer this pt.

## 2022-11-12 NOTE — Progress Notes (Signed)
[facility] [phone]   [phone]  OFFICE PROGRESS NOTE  [name], MD [address] [place] [address]  DIAGNOSIS: Stage IV clear-cell renal cell carcinoma diagnosed initially in 2016 as localized T1 a disease with evidence of metastatic disease to the liver and omentum in 2019.  PRIOR THERAPY: 1) status post radical right renal nephrectomy under the care of Dr. Marlou Porch on February 01, 2015 and the final pathology revealed 3.3 cm clear renal cell carcinoma with Fuhrman grade 3. 2) status post omental biopsy in July 2019 that confirmed the presence of recurrent metastatic renal cell carcinoma. 3) status post treatment with immunotherapy with ipilimumab 1 Mg/KG and nivolumab 3 mg/KG every 3 weeks started 09/21/2017 status post 4 cycles and the patient did not receive any additional immunotherapy secondary to adrenal insufficiency and panhypopituitarism 4) status post radiotherapy to the abdominal lymph node completed November 08, 2019. 5) Cabometyx initially started at 40 mg p.o. daily in March 2023 and reduced to 20 mg p.o. daily in April 2023.  This was discontinued in June 2024 secondary to disease progression. 6) Axitinib 2 mg p.o. twice daily.  First dose started 07/29/2022.  This was discontinued on November 12, 2022 secondary to disease progression.  CURRENT THERAPY: None  INTERVAL HISTORY: [name] 77 y.o. male returns to the clinic today for follow-up visit accompanied by his daughter [name].Discussed the use of AI scribe software for clinical note transcription with the patient, who gave verbal consent to proceed.  History of Present Illness   The patient, a 77 year old with a history of stage 4 renal cell carcinoma, presents with a chief complaint of persistent fatigue. He reports feeling "halfway decent" but expresses a significant lack of energy. He denies any changes in his breathing, chest pain, or coughing  blood. However, he notes occasional minor nosebleeds when blowing his nose.  The patient has experienced weight loss, losing approximately 4-5 pounds since his last visit. Despite this, he reports an increase in his food intake and denies any symptoms of nausea, vomiting, or diarrhea.  He has been on a regimen of Axitinib, an oral chemotherapy drug, which he tolerates well. However, his daughter notes that his energy levels significantly decreased after starting an antifungal medication, Posaconazole, for a sinus infection. The patient is also on a cholesterol medication, Zetia, which the daughter suspects may be contributing to the persistent sinus infection.  Despite the patient's stable condition on the current regimen, recent scans indicate progression of hepatic metastasis and an increase in abdominal lymph nodes, suggesting the cancer is not responding to the current treatment. The patient has a history of complications from immune therapy, limiting potential treatment options.       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  MEDICATIONS:  Current Outpatient Medications  Medication Sig Dispense Refill   acetaminophen (TYLENOL) 500 MG tablet Take 1,000 mg by mouth 2 (two) times daily.     apixaban (ELIQUIS) 2.5 MG TABS tablet Take 1 tablet (2.5 mg total) by mouth 2 (two) times daily. 180 tablet 1    axitinib (INLYTA) 1 MG tablet Take 2 tablets (2 mg total) by mouth 2 (two) times daily. 120 tablet 0   diazepam (VALIUM) 2 MG tablet TAKE 1-2 TABLETS BY MOUTH EVERY 8 HOURS AS NEEDED FOR ANXIETY 60 tablet 0   diltiazem (CARDIZEM CD) 240 MG 24 hr capsule Take 1 capsule (240 mg total) by mouth in the morning. 90 capsule 3   ezetimibe (ZETIA) 10 MG tablet Take 1 tablet (10 mg total) by mouth daily. (Patient taking differently: Take 10 mg by mouth 2 (two) times a week.) 90 tablet 3   famotidine (PEPCID) 20 MG tablet Take 20 mg by mouth 2 (two) times daily as needed.     fluticasone (FLONASE) 50 MCG/ACT nasal spray Place 2 sprays into both nostrils daily as needed for allergies.     hydrocortisone (CORTEF) 5 MG tablet Take by mouth 15 mg in am and 5 mg in pm 400 tablet 3   levothyroxine (SYNTHROID) 150 MCG tablet Take 1 tablet (150 mcg total) by mouth daily before breakfast. 90 tablet 3   loratadine (CLARITIN) 10 MG tablet Take 10 mg by mouth daily as needed for allergies.     megestrol (MEGACE) 20 MG tablet TAKE 1 TABLET BY MOUTH DAILY 30 tablet 1   Multiple Vitamins-Minerals (CENTRUM SILVER 50+MEN) TABS Take 1 tablet by mouth daily with breakfast.     mupirocin ointment (BACTROBAN) 2 % Place 1 Application into the nose daily as needed (sores).     OLANZapine (ZYPREXA) 5 MG tablet Take 1 tablet (5 mg total) by mouth at bedtime. 90 tablet 0   posaconazole (NOXAFIL) 100 MG TBEC delayed-release tablet Take 3 tablets (300 mg total) by mouth daily. 42 tablet 2   potassium chloride 20 MEQ/15ML (10%) SOLN Take 15 mLs (20 mEq total) by mouth 2 (two) times daily. 240 mL 0   traZODone (DESYREL) 150 MG tablet Take 150 mg by mouth at bedtime as needed for sleep.     VITAMIN D PO Take 1 capsule by mouth daily.     No current facility-administered medications for this visit.    SURGICAL HISTORY:  Past Surgical History:  Procedure Laterality Date   APPENDECTOMY  age 77   ARTERY BIOPSY Left 04/10/2022    Procedure: LEFT TEMPORAL ARTERY BIOPSY;  Surgeon: Chuck Hint, MD;  Location: [facility] OR;  Service: Vascular;  Laterality: Left;   CHOLECYSTECTOMY  10/18/1978   EYE SURGERY Bilateral 2019   cataract   LAPAROSCOPIC LYSIS OF ADHESIONS N/A 08/01/2015   Procedure: LAPAROSCOPIC LYSIS OF ADHESIONS;  Surgeon: Karie Soda, MD;  Location: [facility] ORS;  Service: General;  Laterality: N/A;   LUNG LOBECTOMY  02/16/1990   lung cancer- patient has staples in lung not to have MRI per patient    MAXILLARY ANTROSTOMY Left 05/22/2022   Procedure: MAXILLARY ANTROSTOMY;  Surgeon: Laren Boom, DO;  Location: [facility] OR;  Service: ENT;  Laterality: Left;   PILONIDAL CYST EXCISION  02/16/2009   Dr Gerrit Friends   ROBOTIC ASSITED PARTIAL NEPHRECTOMY Right 02/01/2015   Procedure: RIGHT ROBOTIC ASSISTED LAPAROSOCOPY NEPHRECTOMY;  Surgeon: Crist Fat, MD;  Location: [facility] ORS;  Service:  Urology;  Laterality: Right;   SINUS ENDO WITH FUSION Left 05/22/2022   Procedure: SINUS ENDO WITH FUSION;  Surgeon: Laren Boom, DO;  Location: [facility] OR;  Service: ENT;  Laterality: Left;   VENTRAL HERNIA REPAIR N/A 08/01/2015   Procedure: LAPAROSCOPIC VENTRAL WALL HERNIA WITH MESH;  Surgeon: Karie Soda, MD;  Location: [facility] ORS;  Service: General;  Laterality: N/A;    REVIEW OF SYSTEMS:  Constitutional: positive for anorexia, fatigue, and weight loss Eyes: negative Ears, nose, mouth, throat, and face: positive for nasal sores Respiratory: negative Cardiovascular: negative Gastrointestinal: negative Genitourinary:negative Integument/breast: negative Hematologic/lymphatic: negative Musculoskeletal:positive for muscle weakness Neurological: negative Behavioral/Psych: negative Endocrine: negative Allergic/Immunologic: negative   PHYSICAL EXAMINATION: General appearance: alert, cooperative, fatigued, and no distress Head: Normocephalic, without obvious abnormality, atraumatic Neck: no adenopathy, no JVD, supple,  symmetrical, trachea midline, and thyroid not enlarged, symmetric, no tenderness/mass/nodules Lymph nodes: Cervical, supraclavicular, and axillary nodes normal. Resp: clear to auscultation bilaterally Back: symmetric, no curvature. ROM normal. No CVA tenderness. Cardio: regular rate and rhythm, S1, S2 normal, no murmur, click, rub or gallop GI: soft, non-tender; bowel sounds normal; no masses,  no organomegaly Extremities: extremities normal, atraumatic, no cyanosis or edema Neurologic: Alert and oriented X 3, normal strength and tone. Normal symmetric reflexes. Normal coordination and gait  ECOG PERFORMANCE STATUS: 1 - Symptomatic but completely ambulatory  Blood pressure 138/78, pulse 81, temperature 98 F (36.7 C), temperature source Oral, resp. rate 16, height 5\' 8"  (1.727 m), weight 149 lb 3.2 oz (67.7 kg), SpO2 100%.  LABORATORY DATA: Lab Results  Component Value Date   WBC 10.6 (H) 11/04/2022   HGB 8.9 (L) 11/04/2022   HCT 30.4 (L) 11/04/2022   MCV 74.5 (L) 11/04/2022   PLT 404 (H) 11/04/2022      Chemistry      Component Value Date/Time   NA 138 11/04/2022 1018   NA 141 08/11/2021 1252   K 2.7 (LL) 11/04/2022 1018   CL 98 11/04/2022 1018   CO2 32 11/04/2022 1018   BUN 16 11/04/2022 1018   BUN 14 08/11/2021 1252   CREATININE 0.83 11/04/2022 1018   CREATININE 0.97 10/12/2022 1022      Component Value Date/Time   CALCIUM 9.0 11/04/2022 1018   ALKPHOS 134 (H) 11/04/2022 1018   AST 17 11/04/2022 1018   ALT 25 11/04/2022 1018   BILITOT 0.3 11/04/2022 1018       RADIOGRAPHIC STUDIES: CT CHEST ABDOMEN PELVIS WO CONTRAST  Result Date: 11/12/2022 CLINICAL DATA:  Restaging metastatic renal cell carcinoma. * Tracking Code: BO * EXAM: CT CHEST, ABDOMEN AND PELVIS WITHOUT CONTRAST TECHNIQUE: Multidetector CT imaging of the chest, abdomen and pelvis was performed following the standard protocol without IV contrast. RADIATION DOSE REDUCTION: This exam was performed according  to the departmental dose-optimization program which includes automated exposure control, adjustment of the mA and/or kV according to patient size and/or use of iterative reconstruction technique. COMPARISON:  05/03/2022. FINDINGS: CT CHEST FINDINGS Cardiovascular: Normal heart size. Aortic atherosclerosis. Coronary artery calcifications. Mediastinum/Nodes: No enlarged mediastinal, hilar, or axillary lymph nodes. Thyroid gland, trachea, and esophagus demonstrate no significant findings. Lungs/Pleura: No pleural fluid, airspace consolidation, atelectasis or pneumothorax. Partial right lung resection. Stable. No suspicious pulmonary nodule or mass. Musculoskeletal: No suspicious bone lesions. CT ABDOMEN PELVIS FINDINGS Hepatobiliary: Multifocal liver metastases are again noted. Index lesions include: -index lesion along the dome of the left hepatic lobe (segment 4) is best seen on the coronal images. This measures 3.8 x  2.4 cm, image 28/4. Previously (when remeasured) this measured 3.8 x 2.2 cm. -New lesion within segment 2 measures 3.0 x 2.5 cm, image 48/2. -Within lateral dome of right hepatic lobe (segment 7 and 8) there is a confluent tumor measuring approximately 4.7 x 3.3 cm, image 41/2. This is a new finding when compared with 05/03/2022. On the more recent CT of the abdomen pelvis from 07/13/2022 this measured 1.1 x 1.1 cm. -Within the posterior right lobe of liver there is a new low-attenuation lesion measuring 2.3 x 2.3 cm, image 47/2. Status post cholecystectomy. Abnormal dilatation of the proximal common bile duct measures 1.4 cm, image 54/2. Previously 0.8 cm. Pancreas: Large mass in the region of the uncinate process of pancreas measures 7.5 x 7.8 cm, image 62/2. On 07/13/2022 this measured 6.9 by 6.9 cm. Mild main duct dilatation is again seen within the body and tail of pancreas. Spleen: Normal Adrenals/Urinary Tract: Left adrenal gland. Left kidney is unremarkable without signs of nephrolithiasis,  hydronephrosis or suspicious mass. Normal right adrenal gland. Similar appearance of large area of fat necrosis within the right nephrectomy bed. Bladder unremarkable. Stomach/Bowel: Stomach is nondistended. There is no pathologic dilatation of the bowel loops. Extensive sigmoid diverticulosis without signs of acute diverticulitis. Vascular/Lymphatic: Aortic atherosclerosis without aneurysm. -Lymph node within the gastrohepatic ligament measures 1.7 cm, image 55/2. Formally 1.2 cm. -Porta hepatic lymph node measures 2.1 cm, image 55/2. Formally 1.9 cm. -Left periaortic lymph node measures 1.1 cm, image 74/2. Stable from previous exam. -No pelvic or inguinal adenopathy. Reproductive: Prostate is unremarkable. Other: No ascites.  No discrete fluid collections. Musculoskeletal: No aggressive lytic or sclerotic bone lesions. IMPRESSION: 1. Interval progression of hepatic metastatic disease. There are new low-attenuation lesions within the liver when compared with 05/03/2022. 2. Large mass in the region of the uncinate process of pancreas measures 7.5 x 7.8 cm. On 07/13/2022 this measured 6.9 x 6.9 cm. 3. Increase in size of upper abdominal lymph nodes compatible with nodal metastasis. 4. Interval increase and caliber of the proximal common bile duct which measures 1.4 cm. Previously 0.8 cm. 5. No signs of pulmonary metastasis. 6. Sigmoid diverticulosis without signs of acute diverticulitis. 7. Coronary artery calcifications. 8.  Aortic Atherosclerosis (ICD10-I70.0). Electronically Signed   By: Signa Kell M.D.   On: 11/12/2022 09:18    ASSESSMENT AND PLAN: This is a very pleasant 77 years old white male with Stage IV clear-cell renal cell carcinoma diagnosed initially in 2016 as localized T1 a disease with evidence of metastatic disease to the liver and omentum in 2019. The patient underwent the following treatment: 1) status post radical right renal nephrectomy under the care of Dr. Marlou Porch on February 01, 2015  and the final pathology revealed 3.3 cm clear renal cell carcinoma with Fuhrman grade 3. 2) status post omental biopsy in July 2019 that confirmed the presence of recurrent metastatic renal cell carcinoma. 3) status post treatment with immunotherapy with ipilimumab 1 Mg/KG and nivolumab 3 mg/KG every 3 weeks started 09/21/2017 status post 4 cycles and the patient did not receive any additional immunotherapy secondary to adrenal insufficiency and panhypopituitarism 4) status post radiotherapy to the abdominal lymph node completed November 08, 2019. He was on treatment with Cabometyx 20 mg p.o. daily started March 2023.  His treatment has been on hold for the last 2 months because of the recent sinus issues and his final diagnosis was fungal sinusitis and currently on treatment with voriconazole by infectious disease.  Unfortunately his scan  showed evidence for disease progression in the abdomen including the pancreatic mass as well as peripancreatic and retroperitoneal lymphadenopathy as well as liver lesions. I recommended for the patient to discontinue his current treatment with Cabometyx at this point. We considered the patient for treatment with axitinib initially at 5 mg p.o. twice daily but because of drug interaction with posaconazole, we will reduce his dose of treatment to 2 mg p.o. twice daily for now.  He has been tolerating this treatment fairly well. He had repeat CT scan of the chest, abdomen and pelvis performed on 11/04/2022 and that showed interval progression of the hepatic metastatic disease with new low-attenuation lesion within the liver when compared to the previous scan of May 03, 2022. The large mass in the region of the uncinate process of the pancreas measures 7.5 x 7.8 cm and larger than the previous scan.  There was also increase in size of upper abdominal lymph nodes compatible with nodal metastasis and increase in the caliber of the proximal common bile duct which measured 1.4  cm.  There was no signs of pulmonary metastasis. I recommended for the patient to discontinue his treatment with axitinib at this point. I discussed with him several options for management of his condition including treatment with lenvatinib, everolimus versus referral to Dr. Clarene Duke at [facility] for second opinion versus palliative care and hospice referral.  The patient is interested in seeing Dr. Clarene Duke to see if there is any additional options for management of his condition. Assessment and Plan    Metastatic Renal Cell Carcinoma Progressive disease despite Axitinib therapy. Patient experiencing fatigue and weight loss. -Discontinue Axitinib due to lack of efficacy. -Refer to Dr. Clarene Duke at [facility] for a second opinion. -Plan to follow up in 3 weeks after consultation with Dr. Clarene Duke.  Fungal Sinus Infection On Posaconazole therapy. Query regarding potential interaction with Zetia. -Consult with Infectious Disease specialist regarding the continuation of Posaconazole. -Discontinue Zetia due to low priority in the context of current health issues.  General Health Mild anemia noted, consistent with previous findings. -Monitor anemia.     For the anemia he will continue on the oral iron tablet with vitamin C.  He also received iron infusion recently. For the maxillary pain, it has improved and he will continue with his current treatment by the palliative care team. For the suspicious maxillary fungal infection, the patient is currently on treatment with posaconazole and followed by infectious disease. He was advised to call immediately if he has any concerning symptoms in the interval. The patient voices understanding of current disease status and treatment options and is in agreement with the current care plan.  All questions were answered. The patient knows to call the clinic with any problems, questions or concerns. We can certainly see the patient much  sooner if necessary. The total time spent in the appointment was 30 minutes.  Disclaimer: This note was dictated with voice recognition software. Similar sounding words can inadvertently be transcribed and may not be corrected upon review.

## 2022-11-16 ENCOUNTER — Telehealth: Payer: Self-pay | Admitting: Medical Oncology

## 2022-11-16 NOTE — Telephone Encounter (Signed)
Referral- Morrie Sheldon at [facility] received pts records and is working on the referral to Dr Clarene Duke.

## 2022-11-17 ENCOUNTER — Other Ambulatory Visit (HOSPITAL_COMMUNITY): Payer: Self-pay

## 2022-11-17 ENCOUNTER — Telehealth: Payer: Self-pay | Admitting: Medical Oncology

## 2022-11-17 ENCOUNTER — Other Ambulatory Visit: Payer: Self-pay | Admitting: Medical Oncology

## 2022-11-17 DIAGNOSIS — E876 Hypokalemia: Secondary | ICD-10-CM

## 2022-11-17 NOTE — Progress Notes (Signed)
Therapy was discontinued due to disease progression, inactivating from Specialty Pharmacy services at this time.

## 2022-11-17 NOTE — Telephone Encounter (Signed)
LVM to return my call about K= 3.1 and to increase K= rich foods in his diet.

## 2022-11-17 NOTE — Telephone Encounter (Signed)
Schedule message sent for labs this week.

## 2022-11-18 ENCOUNTER — Telehealth: Payer: Self-pay | Admitting: Medical Oncology

## 2022-11-18 NOTE — Telephone Encounter (Signed)
He finished K= liquid.

## 2022-11-18 NOTE — Telephone Encounter (Signed)
[name] -dtr -aware of lab appt tomorrow.

## 2022-11-19 ENCOUNTER — Other Ambulatory Visit: Payer: Self-pay

## 2022-11-19 ENCOUNTER — Other Ambulatory Visit: Payer: Self-pay | Admitting: Physician Assistant

## 2022-11-19 ENCOUNTER — Inpatient Hospital Stay: Payer: Medicare HMO | Attending: Oncology

## 2022-11-19 DIAGNOSIS — R6884 Jaw pain: Secondary | ICD-10-CM

## 2022-11-19 DIAGNOSIS — E876 Hypokalemia: Secondary | ICD-10-CM

## 2022-11-19 DIAGNOSIS — D649 Anemia, unspecified: Secondary | ICD-10-CM | POA: Diagnosis present

## 2022-11-19 DIAGNOSIS — C641 Malignant neoplasm of right kidney, except renal pelvis: Secondary | ICD-10-CM

## 2022-11-19 LAB — COMPREHENSIVE METABOLIC PANEL
ALT: 19 U/L (ref 0–44)
AST: 13 U/L — ABNORMAL LOW (ref 15–41)
Albumin: 2.8 g/dL — ABNORMAL LOW (ref 3.5–5.0)
Alkaline Phosphatase: 89 U/L (ref 38–126)
Anion gap: 8 (ref 5–15)
BUN: 10 mg/dL (ref 8–23)
CO2: 30 mmol/L (ref 22–32)
Calcium: 8.5 mg/dL — ABNORMAL LOW (ref 8.9–10.3)
Chloride: 96 mmol/L — ABNORMAL LOW (ref 98–111)
Creatinine, Ser: 0.81 mg/dL (ref 0.61–1.24)
GFR, Estimated: >60 mL/min (ref >60)
Glucose, Bld: 274 mg/dL — ABNORMAL HIGH (ref 70–99)
Potassium: 3.2 mmol/L — ABNORMAL LOW (ref 3.5–5.1)
Sodium: 134 mmol/L — ABNORMAL LOW (ref 135–145)
Total Bilirubin: 0.4 mg/dL (ref 0.3–1.2)
Total Protein: 6.2 g/dL — ABNORMAL LOW (ref 6.5–8.1)

## 2022-11-19 LAB — CBC WITH DIFFERENTIAL/PLATELET
Abs Immature Granulocytes: 0.08 10*3/uL — ABNORMAL HIGH (ref 0.00–0.07)
Basophils Absolute: 0 10*3/uL (ref 0.0–0.1)
Basophils Relative: 0 %
Eosinophils Absolute: 0 10*3/uL (ref 0.0–0.5)
Eosinophils Relative: 0 %
HCT: 28.2 % — ABNORMAL LOW (ref 39.0–52.0)
Hemoglobin: 8.1 g/dL — ABNORMAL LOW (ref 13.0–17.0)
Immature Granulocytes: 1 %
Lymphocytes Relative: 6 %
Lymphs Abs: 0.7 10*3/uL (ref 0.7–4.0)
MCH: 21.8 pg — ABNORMAL LOW (ref 26.0–34.0)
MCHC: 28.7 g/dL — ABNORMAL LOW (ref 30.0–36.0)
MCV: 76 fL — ABNORMAL LOW (ref 80.0–100.0)
Monocytes Absolute: 0.9 10*3/uL (ref 0.1–1.0)
Monocytes Relative: 8 %
Neutro Abs: 9.7 10*3/uL — ABNORMAL HIGH (ref 1.7–7.7)
Neutrophils Relative %: 85 %
Platelets: 426 10*3/uL — ABNORMAL HIGH (ref 150–400)
RBC: 3.71 MIL/uL — ABNORMAL LOW (ref 4.22–5.81)
RDW: 22.5 % — ABNORMAL HIGH (ref 11.5–15.5)
WBC: 11.4 10*3/uL — ABNORMAL HIGH (ref 4.0–10.5)
nRBC: 0 % (ref 0.0–0.2)

## 2022-11-19 MED ORDER — POTASSIUM CHLORIDE 20 MEQ/15ML (10%) PO SOLN: 20.0000 meq | Freq: Every day | ORAL | 0 refills | Status: DC

## 2022-11-27 ENCOUNTER — Emergency Department (HOSPITAL_COMMUNITY): Payer: Medicare HMO

## 2022-11-27 ENCOUNTER — Encounter (HOSPITAL_COMMUNITY): Payer: Self-pay

## 2022-11-27 ENCOUNTER — Emergency Department (HOSPITAL_COMMUNITY)
Admission: EM | Admit: 2022-11-27 | Discharge: 2022-11-28 | Disposition: A | Discharge status: Home / Self Care | Payer: Medicare HMO | Attending: Emergency Medicine | Admitting: Emergency Medicine

## 2022-11-27 DIAGNOSIS — T07XXXA Unspecified multiple injuries, initial encounter: Secondary | ICD-10-CM

## 2022-11-27 DIAGNOSIS — E039 Hypothyroidism, unspecified: Secondary | ICD-10-CM | POA: Insufficient documentation

## 2022-11-27 DIAGNOSIS — R0781 Pleurodynia: Secondary | ICD-10-CM | POA: Diagnosis not present

## 2022-11-27 DIAGNOSIS — N189 Chronic kidney disease, unspecified: Secondary | ICD-10-CM | POA: Diagnosis not present

## 2022-11-27 DIAGNOSIS — S60511A Abrasion of right hand, initial encounter: Secondary | ICD-10-CM | POA: Insufficient documentation

## 2022-11-27 DIAGNOSIS — M4312 Spondylolisthesis, cervical region: Secondary | ICD-10-CM | POA: Diagnosis not present

## 2022-11-27 DIAGNOSIS — I6529 Occlusion and stenosis of unspecified carotid artery: Secondary | ICD-10-CM | POA: Diagnosis not present

## 2022-11-27 DIAGNOSIS — W19XXXA Unspecified fall, initial encounter: Secondary | ICD-10-CM | POA: Diagnosis not present

## 2022-11-27 DIAGNOSIS — S299XXA Unspecified injury of thorax, initial encounter: Secondary | ICD-10-CM | POA: Diagnosis not present

## 2022-11-27 DIAGNOSIS — I2510 Atherosclerotic heart disease of native coronary artery without angina pectoris: Secondary | ICD-10-CM | POA: Insufficient documentation

## 2022-11-27 DIAGNOSIS — S0993XA Unspecified injury of face, initial encounter: Secondary | ICD-10-CM | POA: Diagnosis present

## 2022-11-27 DIAGNOSIS — S199XXA Unspecified injury of neck, initial encounter: Secondary | ICD-10-CM | POA: Diagnosis not present

## 2022-11-27 DIAGNOSIS — Z79899 Other long term (current) drug therapy: Secondary | ICD-10-CM | POA: Diagnosis not present

## 2022-11-27 DIAGNOSIS — Y92009 Unspecified place in unspecified non-institutional (private) residence as the place of occurrence of the external cause: Secondary | ICD-10-CM

## 2022-11-27 DIAGNOSIS — I517 Cardiomegaly: Secondary | ICD-10-CM | POA: Diagnosis not present

## 2022-11-27 DIAGNOSIS — S0990XA Unspecified injury of head, initial encounter: Secondary | ICD-10-CM | POA: Insufficient documentation

## 2022-11-27 DIAGNOSIS — Z85528 Personal history of other malignant neoplasm of kidney: Secondary | ICD-10-CM | POA: Insufficient documentation

## 2022-11-27 DIAGNOSIS — S0081XA Abrasion of other part of head, initial encounter: Secondary | ICD-10-CM | POA: Diagnosis not present

## 2022-11-27 DIAGNOSIS — I129 Hypertensive chronic kidney disease with stage 1 through stage 4 chronic kidney disease, or unspecified chronic kidney disease: Secondary | ICD-10-CM | POA: Insufficient documentation

## 2022-11-27 DIAGNOSIS — M47812 Spondylosis without myelopathy or radiculopathy, cervical region: Secondary | ICD-10-CM | POA: Diagnosis not present

## 2022-11-27 DIAGNOSIS — S50811A Abrasion of right forearm, initial encounter: Secondary | ICD-10-CM | POA: Diagnosis not present

## 2022-11-27 DIAGNOSIS — Z7901 Long term (current) use of anticoagulants: Secondary | ICD-10-CM | POA: Diagnosis not present

## 2022-11-27 DIAGNOSIS — M47816 Spondylosis without myelopathy or radiculopathy, lumbar region: Secondary | ICD-10-CM | POA: Diagnosis not present

## 2022-11-27 DIAGNOSIS — M25551 Pain in right hip: Secondary | ICD-10-CM | POA: Diagnosis not present

## 2022-11-27 DIAGNOSIS — I6523 Occlusion and stenosis of bilateral carotid arteries: Secondary | ICD-10-CM | POA: Diagnosis not present

## 2022-11-27 MED ORDER — BACITRACIN ZINC 500 UNIT/GM EX OINT
TOPICAL_OINTMENT | Freq: Two times a day (BID) | CUTANEOUS | Status: DC
  Filled 2022-11-27: qty 2.7

## 2022-11-27 NOTE — Progress Notes (Deleted)
[facility] OFFICE PROGRESS NOTE  [name], MD [address] [place] [address]  DIAGNOSIS: ***  PRIOR THERAPY:  CURRENT THERAPY:  INTERVAL HISTORY: [name] 77 y.o. male returns to clinic today for follow-up visit accompanied by ***. The patient was last seen in the clinic last week by Dr. Arbutus Ped.  Patient had been on treatment with oral chemotherapy with Axitinib. At that point in time, he had a repeat CT scan that showed progressive disease in the liver as well as large mass in the on connate process of the pancreas that is larger than previous.  There is also increase in size of abdominal lymph nodes compatible with worsening nodal metastases.  Dr. Arbutus Ped recommended that he be referred to Dr. Clarene Duke at [facility] for second opinion.  Duke, cancer.   Dr. Clarene Duke.   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  MEDICATIONS:  Current Outpatient Medications  Medication Sig Dispense Refill   acetaminophen (TYLENOL) 500 MG tablet Take 1,000 mg by mouth 2 (two) times daily.     apixaban (ELIQUIS) 2.5 MG TABS tablet Take 1 tablet (2.5 mg total) by mouth 2 (two) times daily. 180 tablet 1   axitinib (INLYTA) 1 MG tablet Take 2 tablets (2 mg total) by mouth 2 (two) times daily. 120 tablet 0   diazepam (VALIUM) 2 MG tablet TAKE 1-2 TABLETS BY  MOUTH EVERY 8 HOURS AS NEEDED FOR ANXIETY 60 tablet 0   diltiazem (CARDIZEM CD) 240 MG 24 hr capsule Take 1 capsule (240 mg total) by mouth in the morning. 90 capsule 3   ezetimibe (ZETIA) 10 MG tablet Take 1 tablet (10 mg total) by mouth daily. (Patient taking differently: Take 10 mg by mouth 2 (two) times a week.) 90 tablet 3   famotidine (PEPCID) 20 MG tablet Take 20 mg by mouth 2 (two) times daily as needed.     fluticasone (FLONASE) 50 MCG/ACT nasal spray Place 2 sprays into both nostrils daily as needed for allergies.     hydrocortisone (CORTEF) 5 MG tablet Take by mouth 15 mg in am and 5 mg in pm 400 tablet 3   levothyroxine (SYNTHROID) 150 MCG tablet Take 1 tablet (150 mcg total) by mouth daily before breakfast. 90 tablet 3   loratadine (CLARITIN) 10 MG tablet Take 10 mg by mouth daily as needed for allergies.     megestrol (MEGACE) 20 MG tablet TAKE 1 TABLET BY MOUTH DAILY 30 tablet 1   Multiple Vitamins-Minerals (CENTRUM SILVER 50+MEN) TABS Take 1 tablet by mouth daily with breakfast.     mupirocin ointment (BACTROBAN) 2 % Place 1 Application into the nose daily as needed (sores).     OLANZapine (ZYPREXA) 5 MG tablet Take 1  tablet (5 mg total) by mouth at bedtime. 90 tablet 0   posaconazole (NOXAFIL) 100 MG TBEC delayed-release tablet Take 3 tablets (300 mg total) by mouth daily. 42 tablet 2   potassium chloride 20 MEQ/15ML (10%) SOLN Take 15 mLs (20 mEq total) by mouth daily. 105 mL 0   traZODone (DESYREL) 150 MG tablet Take 150 mg by mouth at bedtime as needed for sleep.     VITAMIN D PO Take 1 capsule by mouth daily.     No current facility-administered medications for this visit.    SURGICAL HISTORY:  Past Surgical History:  Procedure Laterality Date   APPENDECTOMY  age 47   ARTERY BIOPSY Left 04/10/2022   Procedure: LEFT TEMPORAL ARTERY BIOPSY;  Surgeon: Chuck Hint, MD;  Location: [facility] OR;  Service: Vascular;  Laterality: Left;   CHOLECYSTECTOMY  10/18/1978   EYE  SURGERY Bilateral 2019   cataract   LAPAROSCOPIC LYSIS OF ADHESIONS N/A 08/01/2015   Procedure: LAPAROSCOPIC LYSIS OF ADHESIONS;  Surgeon: Karie Soda, MD;  Location: [facility] ORS;  Service: General;  Laterality: N/A;   LUNG LOBECTOMY  02/16/1990   lung cancer- patient has staples in lung not to have MRI per patient    MAXILLARY ANTROSTOMY Left 05/22/2022   Procedure: MAXILLARY ANTROSTOMY;  Surgeon: Laren Boom, DO;  Location: [facility] OR;  Service: ENT;  Laterality: Left;   PILONIDAL CYST EXCISION  02/16/2009   Dr Gerrit Friends   ROBOTIC ASSITED PARTIAL NEPHRECTOMY Right 02/01/2015   Procedure: RIGHT ROBOTIC ASSISTED LAPAROSOCOPY NEPHRECTOMY;  Surgeon: Crist Fat, MD;  Location: [facility] ORS;  Service: Urology;  Laterality: Right;   SINUS ENDO WITH FUSION Left 05/22/2022   Procedure: SINUS ENDO WITH FUSION;  Surgeon: Laren Boom, DO;  Location: [facility] OR;  Service: ENT;  Laterality: Left;   VENTRAL HERNIA REPAIR N/A 08/01/2015   Procedure: LAPAROSCOPIC VENTRAL WALL HERNIA WITH MESH;  Surgeon: Karie Soda, MD;  Location: [facility] ORS;  Service: General;  Laterality: N/A;    REVIEW OF SYSTEMS:   Review of Systems  Constitutional: Negative for appetite change, chills, fatigue, fever and unexpected weight change.  HENT:   Negative for mouth sores, nosebleeds, sore throat and trouble swallowing.   Eyes: Negative for eye problems and icterus.  Respiratory: Negative for cough, hemoptysis, shortness of breath and wheezing.   Cardiovascular: Negative for chest pain and leg swelling.  Gastrointestinal: Negative for abdominal pain, constipation, diarrhea, nausea and vomiting.  Genitourinary: Negative for bladder incontinence, difficulty urinating, dysuria, frequency and hematuria.   Musculoskeletal: Negative for back pain, gait problem, neck pain and neck stiffness.  Skin: Negative for itching and rash.  Neurological: Negative for dizziness, extremity weakness, gait problem, headaches, light-headedness and  seizures.  Hematological: Negative for adenopathy. Does not bruise/bleed easily.  Psychiatric/Behavioral: Negative for confusion, depression and sleep disturbance. The patient is not nervous/anxious.     PHYSICAL EXAMINATION:  There were no vitals taken for this visit.  ECOG PERFORMANCE STATUS: {CHL ONC ECOG [id number]  Physical Exam  Constitutional: Oriented to person, place, and time and well-developed, well-nourished, and in no distress. No distress.  HENT:  Head: Normocephalic and atraumatic.  Mouth/Throat: Oropharynx is clear and moist. No oropharyngeal exudate.  Eyes: Conjunctivae are normal. Right eye exhibits no discharge. Left eye exhibits no discharge. No scleral icterus.  Neck: Normal range of motion. Neck supple.  Cardiovascular: Normal rate, regular rhythm, normal heart sounds and intact distal pulses.   Pulmonary/Chest: Effort normal and breath sounds normal. No respiratory  distress. No wheezes. No rales.  Abdominal: Soft. Bowel sounds are normal. Exhibits no distension and no mass. There is no tenderness.  Musculoskeletal: Normal range of motion. Exhibits no edema.  Lymphadenopathy:    No cervical adenopathy.  Neurological: Alert and oriented to person, place, and time. Exhibits normal muscle tone. Gait normal. Coordination normal.  Skin: Skin is warm and dry. No rash noted. Not diaphoretic. No erythema. No pallor.  Psychiatric: Mood, memory and judgment normal.  Vitals reviewed.  LABORATORY DATA: Lab Results  Component Value Date   WBC 11.4 (H) 11/19/2022   HGB 8.1 (L) 11/19/2022   HCT 28.2 (L) 11/19/2022   MCV 76.0 (L) 11/19/2022   PLT 426 (H) 11/19/2022      Chemistry      Component Value Date/Time   NA 134 (L) 11/19/2022 1240   NA 141 08/11/2021 1252   K 3.2 (L) 11/19/2022 1240   CL 96 (L) 11/19/2022 1240   CO2 30 11/19/2022 1240   BUN 10 11/19/2022 1240   BUN 14 08/11/2021 1252   CREATININE 0.81 11/19/2022 1240   CREATININE 0.83 11/04/2022 1018    CREATININE 0.97 10/12/2022 1022      Component Value Date/Time   CALCIUM 8.5 (L) 11/19/2022 1240   ALKPHOS 89 11/19/2022 1240   AST 13 (L) 11/19/2022 1240   AST 17 11/04/2022 1018   ALT 19 11/19/2022 1240   ALT 25 11/04/2022 1018   BILITOT 0.4 11/19/2022 1240   BILITOT 0.3 11/04/2022 1018       RADIOGRAPHIC STUDIES:  CT CHEST ABDOMEN PELVIS WO CONTRAST  Result Date: 11/12/2022 CLINICAL DATA:  Restaging metastatic renal cell carcinoma. * Tracking Code: BO * EXAM: CT CHEST, ABDOMEN AND PELVIS WITHOUT CONTRAST TECHNIQUE: Multidetector CT imaging of the chest, abdomen and pelvis was performed following the standard protocol without IV contrast. RADIATION DOSE REDUCTION: This exam was performed according to the departmental dose-optimization program which includes automated exposure control, adjustment of the mA and/or kV according to patient size and/or use of iterative reconstruction technique. COMPARISON:  05/03/2022. FINDINGS: CT CHEST FINDINGS Cardiovascular: Normal heart size. Aortic atherosclerosis. Coronary artery calcifications. Mediastinum/Nodes: No enlarged mediastinal, hilar, or axillary lymph nodes. Thyroid gland, trachea, and esophagus demonstrate no significant findings. Lungs/Pleura: No pleural fluid, airspace consolidation, atelectasis or pneumothorax. Partial right lung resection. Stable. No suspicious pulmonary nodule or mass. Musculoskeletal: No suspicious bone lesions. CT ABDOMEN PELVIS FINDINGS Hepatobiliary: Multifocal liver metastases are again noted. Index lesions include: -index lesion along the dome of the left hepatic lobe (segment 4) is best seen on the coronal images. This measures 3.8 x 2.4 cm, image 28/4. Previously (when remeasured) this measured 3.8 x 2.2 cm. -New lesion within segment 2 measures 3.0 x 2.5 cm, image 48/2. -Within lateral dome of right hepatic lobe (segment 7 and 8) there is a confluent tumor measuring approximately 4.7 x 3.3 cm, image 41/2. This is  a new finding when compared with 05/03/2022. On the more recent CT of the abdomen pelvis from 07/13/2022 this measured 1.1 x 1.1 cm. -Within the posterior right lobe of liver there is a new low-attenuation lesion measuring 2.3 x 2.3 cm, image 47/2. Status post cholecystectomy. Abnormal dilatation of the proximal common bile duct measures 1.4 cm, image 54/2. Previously 0.8 cm. Pancreas: Large mass in the region of the uncinate process of pancreas measures 7.5 x 7.8 cm, image 62/2. On 07/13/2022 this measured 6.9 by 6.9 cm. Mild main duct dilatation is again seen within the body and  tail of pancreas. Spleen: Normal Adrenals/Urinary Tract: Left adrenal gland. Left kidney is unremarkable without signs of nephrolithiasis, hydronephrosis or suspicious mass. Normal right adrenal gland. Similar appearance of large area of fat necrosis within the right nephrectomy bed. Bladder unremarkable. Stomach/Bowel: Stomach is nondistended. There is no pathologic dilatation of the bowel loops. Extensive sigmoid diverticulosis without signs of acute diverticulitis. Vascular/Lymphatic: Aortic atherosclerosis without aneurysm. -Lymph node within the gastrohepatic ligament measures 1.7 cm, image 55/2. Formally 1.2 cm. -Porta hepatic lymph node measures 2.1 cm, image 55/2. Formally 1.9 cm. -Left periaortic lymph node measures 1.1 cm, image 74/2. Stable from previous exam. -No pelvic or inguinal adenopathy. Reproductive: Prostate is unremarkable. Other: No ascites.  No discrete fluid collections. Musculoskeletal: No aggressive lytic or sclerotic bone lesions. IMPRESSION: 1. Interval progression of hepatic metastatic disease. There are new low-attenuation lesions within the liver when compared with 05/03/2022. 2. Large mass in the region of the uncinate process of pancreas measures 7.5 x 7.8 cm. On 07/13/2022 this measured 6.9 x 6.9 cm. 3. Increase in size of upper abdominal lymph nodes compatible with nodal metastasis. 4. Interval increase  and caliber of the proximal common bile duct which measures 1.4 cm. Previously 0.8 cm. 5. No signs of pulmonary metastasis. 6. Sigmoid diverticulosis without signs of acute diverticulitis. 7. Coronary artery calcifications. 8.  Aortic Atherosclerosis (ICD10-I70.0). Electronically Signed   By: Signa Kell M.D.   On: 11/12/2022 09:18     ASSESSMENT/PLAN:  No problem-specific Assessment & Plan notes found for this encounter.   No orders of the defined types were placed in this encounter.    I spent {CHL ONC TIME VISIT - QIHKV:[id number]} counseling the patient face to face. The total time spent in the appointment was {CHL ONC TIME VISIT - IEPPI:[id number]}.  Sherrill Buikema L Ereka Brau, PA-C 11/27/22

## 2022-11-27 NOTE — ED Triage Notes (Signed)
Pt arrived POV with daughter after falling face forward when bent over trying to hug his grandson, daughter witnessed fall, stated pt lost his balance when he bent over. Pt hit his forehead, multiple skin tears to right arm and hand, and c/o right sided rib pain. Daughter reports not LOC, pt is on Eliquis. A&O 4, NAD Noted at current.

## 2022-11-27 NOTE — Discharge Instructions (Signed)
Thank you for coming to [facility] Emergency Department. You were seen for fall at home. We did an exam, and imaging, and these showed no acute findings.  Please follow up with your oncologist next week as originally scheduled. You can take tylenol 1,000 mg every 8 hours as needed for pain.   Do not hesitate to return to the ED or call 911 if you experience: -Worsening symptoms -Shortness of breath, chest pain -Lightheadedness, passing out -Fevers/chills -Anything else that concerns you

## 2022-11-27 NOTE — ED Notes (Signed)
Cleaned and bandaged wounds,

## 2022-11-27 NOTE — ED Provider Notes (Signed)
[place] EMERGENCY DEPARTMENT AT [facility] Provider Note   CSN: [id number] Arrival date & time: 11/27/22  2038     History {Add pertinent medical, surgical, social history, OB history to HPI:1} Chief Complaint  Patient presents with   Fall on Eliquis    [name] is a 77 y.o. male with PMH as listed below who presents with ***.  Pt arrived POV with daughter after falling face forward when bent over trying to hug his grandson, daughter witnessed fall, stated pt lost his balance when he bent over. Pt hit his forehead, multiple skin tears to right arm and hand, and c/o right sided rib pain. Daughter reports not LOC, pt is on Eliquis. A&O 4, NAD Noted at current.   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 Authorizing Provider  acetaminophen (TYLENOL) 500 MG tablet Take 1,000 mg by mouth 2 (two) times daily.    [provider]  apixaban (ELIQUIS) 2.5 MG TABS tablet Take 1 tablet (2.5 mg total) by mouth 2 (two) times daily. 08/31/22   Croitoru, Mihai, MD  axitinib (INLYTA) 1 MG tablet Take 2 tablets (2 mg total) by mouth 2 (two) times daily. 10/26/22   Si Gaul, MD  diazepam (VALIUM) 2 MG tablet TAKE 1-2 TABLETS BY MOUTH EVERY 8 HOURS AS NEEDED FOR ANXIETY 10/21/22   Pickenpack-Cousar, Arty Baumgartner, NP  diltiazem (CARDIZEM CD) 240 MG 24 hr  capsule Take 1 capsule (240 mg total) by mouth in the morning. 03/26/22   Croitoru, Mihai, MD  ezetimibe (ZETIA) 10 MG tablet Take 1 tablet (10 mg total) by mouth daily. Patient taking differently: Take 10 mg by mouth 2 (two) times a week. 03/27/22   Croitoru, Mihai, MD  famotidine (PEPCID) 20 MG tablet Take 20 mg by mouth 2 (two) times daily as needed. 09/15/22   [provider]  fluticasone (FLONASE) 50 MCG/ACT nasal spray Place 2 sprays into both nostrils daily as needed for allergies. 01/20/22   [provider]  hydrocortisone (CORTEF) 5 MG tablet Take by mouth 15 mg in am and 5 mg in pm 08/03/22   Carlus Pavlov, MD  levothyroxine (SYNTHROID) 150 MCG tablet Take 1 tablet (150 mcg total) by mouth daily before breakfast. 08/03/22   Carlus Pavlov, MD  loratadine (CLARITIN) 10 MG tablet Take 10 mg by mouth daily as needed for allergies.    [provider]  megestrol (MEGACE) 20 MG tablet TAKE 1 TABLET BY MOUTH DAILY 10/21/22   Heilingoetter, Cassandra L, PA-C  Multiple Vitamins-Minerals (CENTRUM SILVER 50+MEN) TABS Take 1 tablet by mouth daily with breakfast.    [provider]  mupirocin ointment (BACTROBAN) 2 % Place 1 Application into the nose daily as  needed (sores).    [provider]  OLANZapine (ZYPREXA) 5 MG tablet Take 1 tablet (5 mg total) by mouth at bedtime. 06/28/22 09/26/22  [place], Alvira Philips, DO  posaconazole (NOXAFIL) 100 MG TBEC delayed-release tablet Take 3 tablets (300 mg total) by mouth daily. 10/22/22   Danelle Earthly, MD  potassium chloride 20 MEQ/15ML (10%) SOLN Take 15 mLs (20 mEq total) by mouth daily. 11/19/22   Heilingoetter, Cassandra L, PA-C  traZODone (DESYREL) 150 MG tablet Take 150 mg by mouth at bedtime as needed for sleep.    [provider]  VITAMIN D PO Take 1 capsule by mouth daily.    [provider]      Allergies    Diphenhydramine    Review of Systems   Review of Systems A 10 point review of systems  was performed and is negative unless otherwise reported in HPI.  Physical Exam Updated Vital Signs BP (!) 170/71   Pulse 85   Temp 98.2 F (36.8 C) (Oral)   Resp 18   Ht 5\' 8"  (1.727 m)   Wt 63.5 kg   SpO2 99%   BMI 21.29 kg/m  Physical Exam General: Normal appearing {Desc; male/male:11659}, lying in bed.  HEENT: PERRLA, Sclera anicteric, MMM, trachea midline.  Cardiology: RRR, no murmurs/rubs/gallops. BL radial and DP pulses equal bilaterally.  Resp: Normal respiratory rate and effort. CTAB, no wheezes, rhonchi, crackles.  Abd: Soft, non-tender, non-distended. No rebound tenderness or guarding.  GU: Deferred. MSK: No peripheral edema or signs of trauma. Extremities without deformity or TTP. No cyanosis or clubbing. Skin: warm, dry. No rashes or lesions. Back: No CVA tenderness Neuro: A&Ox4, CNs II-XII grossly intact. MAEs. Sensation grossly intact.  Psych: Normal mood and affect.   ED Results / Procedures / Treatments   Labs (all labs ordered are listed, but only abnormal results are displayed) Labs Reviewed - No data to display  EKG None  Radiology No results found.  Procedures Procedures  {Document cardiac monitor, telemetry assessment procedure when appropriate:1}  Medications Ordered in ED Medications - No data to display  ED Course/ Medical Decision Making/ A&P                          Medical Decision Making Amount and/or Complexity of Data Reviewed Radiology: ordered.  Risk OTC drugs. Prescription drug management.    This patient presents to the ED for concern of ***, this involves an extensive number of treatment options, and is a complaint that carries with it a high risk of complications and morbidity.  I considered the following differential and admission for this acute, potentially life threatening condition.   MDM:    ***  Clinical Course as of 11/28/22 1405  Fri Nov 27, 2022  2320 Negative CTH, CT C-spine, CXR, and PXR imaging.  [HN]   2351 D/w patient who states that he does not normally take any pain medicine at home.  He normally does have chest pain related to his cancer that sometimes keeps him awake at night but he does not like to take pain medicine.  He does have soreness in his chest right now as a result of the fall.  Discussed with patient and his daughter and he would like to take some pain medicine.  Will prescribe him a short course of oxycodone and instructed to take Tylenol as well as the oxycodone at home.  He does have follow-up with his oncologist next week  and discuss more long-term pain management at that point.  Discussed doing CT of the chest to further evaluate for possible occult rib fractures and patient declined stating he would like to go home.  Advised close monitoring and care when taking oxycodone as they can increase risk for falls at home.  Given discharge instructions and return precautions, all questions answered to patient satisfaction. [HN]  Sat Nov 28, 2022  0007 D/w pharmacy about oxycodone given CYP3A4 effects w/ posaconazole. Recommends short course, PRN, conservative therapy. Discussed this with patient and daughter. [HN]    Clinical Course User Index [HN] Loetta Rough, MD    Labs: I Ordered, and personally interpreted labs.  The pertinent results include:  ***  Imaging Studies ordered: I ordered imaging studies including *** I independently visualized and interpreted imaging. I agree with the radiologist interpretation  Additional history obtained from ***.  External records from outside source obtained and reviewed including ***  Cardiac Monitoring: The patient was maintained on a cardiac monitor.  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   I have reviewed the patients home medicines and have made adjustments as needed  Problem List / ED Course: Problem List Items Addressed This Visit   None        {Document critical care time when appropriate:1} {Document review of labs and clinical decision tools ie heart score, Chads2Vasc2 etc:1}  {Document your independent review of radiology images, and any outside records:1} {Document your discussion with family members, caretakers, and with consultants:1} {Document social determinants of health affecting pt's care:1} {Document your decision making why or why not admission, treatments were needed:1}  This note was created using dictation software, which may contain spelling or grammatical errors.

## 2022-11-28 MED ORDER — ACETAMINOPHEN 325 MG PO TABS
650.0000 mg | ORAL_TABLET | Freq: Once | ORAL | Status: AC
  Administered 2022-11-28: 650 mg via ORAL
  Filled 2022-11-28: qty 2

## 2022-11-28 MED ORDER — OXYCODONE HCL 5 MG PO TABS
5.0000 mg | ORAL_TABLET | Freq: Four times a day (QID) | ORAL | 0 refills | Status: AC | PRN
Start: 2022-11-27 — End: 2022-11-30

## 2022-11-28 MED ORDER — OXYCODONE HCL 5 MG PO TABS
2.5000 mg | ORAL_TABLET | Freq: Once | ORAL | Status: AC
  Administered 2022-11-28: 2.5 mg via ORAL
  Filled 2022-11-28: qty 1

## 2022-12-01 ENCOUNTER — Other Ambulatory Visit: Payer: Self-pay | Admitting: Physician Assistant

## 2022-12-02 ENCOUNTER — Ambulatory Visit: Payer: Medicare HMO | Admitting: Physician Assistant

## 2022-12-02 ENCOUNTER — Other Ambulatory Visit: Payer: Medicare HMO

## 2022-12-02 DIAGNOSIS — C649 Malignant neoplasm of unspecified kidney, except renal pelvis: Secondary | ICD-10-CM | POA: Diagnosis not present

## 2022-12-02 DIAGNOSIS — C787 Secondary malignant neoplasm of liver and intrahepatic bile duct: Secondary | ICD-10-CM | POA: Diagnosis not present

## 2022-12-03 ENCOUNTER — Other Ambulatory Visit: Payer: Self-pay | Admitting: Medical Oncology

## 2022-12-03 ENCOUNTER — Inpatient Hospital Stay: Payer: Medicare HMO

## 2022-12-03 DIAGNOSIS — R6884 Jaw pain: Secondary | ICD-10-CM

## 2022-12-03 DIAGNOSIS — D649 Anemia, unspecified: Secondary | ICD-10-CM | POA: Diagnosis not present

## 2022-12-03 DIAGNOSIS — E876 Hypokalemia: Secondary | ICD-10-CM

## 2022-12-03 DIAGNOSIS — C641 Malignant neoplasm of right kidney, except renal pelvis: Secondary | ICD-10-CM

## 2022-12-03 LAB — CBC WITH DIFFERENTIAL (CANCER CENTER ONLY)
Abs Immature Granulocytes: 0.15 10*3/uL — ABNORMAL HIGH (ref 0.00–0.07)
Basophils Absolute: 0 10*3/uL (ref 0.0–0.1)
Basophils Relative: 0 %
Eosinophils Absolute: 0.1 10*3/uL (ref 0.0–0.5)
Eosinophils Relative: 1 %
HCT: 27.7 % — ABNORMAL LOW (ref 39.0–52.0)
Hemoglobin: 7.9 g/dL — ABNORMAL LOW (ref 13.0–17.0)
Immature Granulocytes: 1 %
Lymphocytes Relative: 12 %
Lymphs Abs: 1.4 10*3/uL (ref 0.7–4.0)
MCH: 22.1 pg — ABNORMAL LOW (ref 26.0–34.0)
MCHC: 28.5 g/dL — ABNORMAL LOW (ref 30.0–36.0)
MCV: 77.4 fL — ABNORMAL LOW (ref 80.0–100.0)
Monocytes Absolute: 0.9 10*3/uL (ref 0.1–1.0)
Monocytes Relative: 8 %
Neutro Abs: 9.4 10*3/uL — ABNORMAL HIGH (ref 1.7–7.7)
Neutrophils Relative %: 78 %
Platelet Count: 405 10*3/uL — ABNORMAL HIGH (ref 150–400)
RBC: 3.58 MIL/uL — ABNORMAL LOW (ref 4.22–5.81)
RDW: 20.7 % — ABNORMAL HIGH (ref 11.5–15.5)
WBC Count: 11.9 10*3/uL — ABNORMAL HIGH (ref 4.0–10.5)
nRBC: 0 % (ref 0.0–0.2)

## 2022-12-03 LAB — CMP (CANCER CENTER ONLY)
ALT: 9 U/L (ref 0–44)
AST: 9 U/L — ABNORMAL LOW (ref 15–41)
Albumin: 2.8 g/dL — ABNORMAL LOW (ref 3.5–5.0)
Alkaline Phosphatase: 116 U/L (ref 38–126)
Anion gap: 10 (ref 5–15)
BUN: 14 mg/dL (ref 8–23)
CO2: 36 mmol/L — ABNORMAL HIGH (ref 22–32)
Calcium: 8.2 mg/dL — ABNORMAL LOW (ref 8.9–10.3)
Chloride: 94 mmol/L — ABNORMAL LOW (ref 98–111)
Creatinine: 0.99 mg/dL (ref 0.61–1.24)
GFR, Estimated: >60 mL/min (ref >60)
Glucose, Bld: 198 mg/dL — ABNORMAL HIGH (ref 70–99)
Potassium: 2.1 mmol/L — CL (ref 3.5–5.1)
Sodium: 140 mmol/L (ref 135–145)
Total Bilirubin: 0.4 mg/dL (ref 0.3–1.2)
Total Protein: 6.1 g/dL — ABNORMAL LOW (ref 6.5–8.1)

## 2022-12-03 LAB — FERRITIN: Ferritin: 3469 ng/mL — ABNORMAL HIGH (ref 24–336)

## 2022-12-03 LAB — LACTATE DEHYDROGENASE: LDH: 166 U/L (ref 98–192)

## 2022-12-03 LAB — IRON AND IRON BINDING CAPACITY (CC-WL,HP ONLY)
Iron: 11 ug/dL — ABNORMAL LOW (ref 45–182)
Saturation Ratios: 9 % — ABNORMAL LOW (ref 17.9–39.5)
TIBC: 119 ug/dL — ABNORMAL LOW (ref 250–450)
UIBC: 108 ug/dL — ABNORMAL LOW (ref 117–376)

## 2022-12-03 MED ORDER — POTASSIUM CHLORIDE 20 MEQ/15ML (10%) PO SOLN: 40.0000 meq | Freq: Every day | ORAL | 0 refills | Status: DC

## 2022-12-03 NOTE — Progress Notes (Signed)
Dtr notified of new rx.

## 2022-12-03 NOTE — Progress Notes (Signed)
CRITICAL VALUE STICKER  CRITICAL VALUE:K+ =2.1  RECEIVER (on-site recipient of call):Antwion Carpenter  DATE & TIME NOTIFIED:  12/03/22 @ 1145 MESSENGER (representative from lab):Melissa  MD NOTIFIED: Arbutus Ped  TIME OF NOTIFICATION:1146  RESPONSE:  sent in oral Potassium Chloride rx

## 2022-12-06 NOTE — Progress Notes (Unsigned)
[facility] OFFICE PROGRESS NOTE  [name], MD [address] [place] [address]  DIAGNOSIS: Stage IV clear-cell renal cell carcinoma diagnosed initially in 2016 as localized T1 a disease with evidence of metastatic disease to the liver and omentum in 2019.   PRIOR THERAPY: 1) status post radical right renal nephrectomy under the care of Dr. Marlou Porch on February 01, 2015 and the final pathology revealed 3.3 cm clear renal cell carcinoma with Fuhrman grade 3. 2) status post omental biopsy in July 2019 that confirmed the presence of recurrent metastatic renal cell carcinoma. 3) status post treatment with immunotherapy with ipilimumab 1 Mg/KG and nivolumab 3 mg/KG every 3 weeks started 09/21/2017 status post 4 cycles and the patient did not receive any additional immunotherapy secondary to adrenal insufficiency and panhypopituitarism 4) status post radiotherapy to the abdominal lymph node completed November 08, 2019. 5) Cabometyx initially started at 40 mg p.o. daily in March 2023 and reduced to 20 mg p.o. daily in April 2023.  This was discontinued in June 2024 secondary to disease progression. 6) Axitinib 2 mg p.o. twice daily.  First dose started 07/29/2022.  This was discontinued on November 12, 2022 secondary to disease progression.  CURRENT THERAPY: 120 mg p.o. daily of belzutifan, starting in the next few days.   INTERVAL HISTORY: [name] 77 y.o. male returns to the clinic today for a follow up visit accompanied by his daughter. The patient was last seen by Dr. Arbutus Ped on 11/12/22. He was being seen for his renal cell carcinoma with dose reduced axitinib due to drug to drug interaction with his posaconazole, which he is taking under the direction of ID for fungal sinus infection. However, at his last appointment he had a restaging CT scan that showed disease progression. Therefore, his treatment was discontinued.   He then saw Dr. Clarene Duke at [facility] for  recommendations. Other options include lenvatinib +/- everolimus. They are concerned about his ability to tolerate TKIs given his decline. Due to DDI with posaconazole and everolimus so would need to hold this unless he stops posaconazole. They also discussed alternative option of belzutifan. Therefore, she recommended belzutifan 120 mg daily.   The patient is feeling unwell today.  He has been struggling with significant fatigue and generalized weakness for several months but he has felt worse over the last week or so.  He also weight 149 pounds at his last appointment and he weighs 161 pounds today and is endorsing associated swelling in his bilateral lower extremities and hands.  His last echocardiogram was performed in January 2024 and he had normal pumping function.  Seen his cardiologist in over 6 months due to his other ongoing health problems.  He also has a low-grade fever 100.5 today.  He denies any known sick contacts.  Denies any other known fevers, sore throat, nasal congestion, abdominal pain, diarrhea, dysuria, or skin infections.  He does have some leukocytosis on labs but he is also taking steroids daily.  He mentions he had an episode of chest pain recently that radiated down his left arm for approximately 1 hour.  He was not evaluated.  He is also been struggling with significant hypokalemia and he is currently on p.o. potassium supplements that were prescribed by Dr. Arbutus Ped last week.  He also has been struggling with anemia.  His last ferritin was significantly elevated over 3000.  His iron studies showed low iron at 11, low TIBC of 119, low saturation at 9%, and low  UIBC.  He takes an iron supplement p.o. daily and is compliant with this.  His last IV iron infusion was on 09/30/2022 per chart review.  Regarding his poor p.o. intake, he drinks Ensure 1-2 times per day.  He continues to take Megace for his poor appetite.  He was previously following with palliative care for pain management  and was on oxycodone and Valium.  He is no longer on this and only takes Tylenol for pain.  He reports mild dyspnea on exertion.  Denies any cough.  He is here today for evaluation and repeat blood work.     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  MEDICATIONS:  Current Outpatient Medications  Medication Sig Dispense Refill   belzutifan (WELIREG) 40 MG tablet Take 3 tablets (120 mg total) by mouth daily. 90 tablet 3   acetaminophen (TYLENOL) 500 MG tablet Take 1,000 mg by mouth 2 (two) times daily.     apixaban (ELIQUIS) 2.5 MG TABS tablet Take 1 tablet (2.5 mg total) by mouth 2 (two) times daily. 180 tablet 1   axitinib (INLYTA) 1 MG tablet Take 2 tablets (2 mg total) by mouth 2 (two) times daily. 120 tablet 0   diazepam (VALIUM) 2 MG tablet TAKE 1-2 TABLETS BY MOUTH EVERY 8 HOURS AS NEEDED FOR ANXIETY 60 tablet 0   diltiazem (CARDIZEM CD) 240 MG 24 hr capsule Take 1 capsule (240 mg total) by mouth in the morning. 90 capsule 3   ezetimibe (ZETIA) 10 MG tablet Take 1 tablet (10 mg total) by mouth daily. (Patient taking differently: Take 10 mg by mouth 2 (two) times a week.) 90 tablet 3   famotidine (PEPCID) 20 MG tablet Take 20 mg by mouth 2 (two) times daily as needed.     fluticasone (FLONASE) 50 MCG/ACT nasal spray Place 2 sprays into both nostrils daily as needed for allergies.      hydrocortisone (CORTEF) 5 MG tablet Take by mouth 15 mg in am and 5 mg in pm 400 tablet 3   levothyroxine (SYNTHROID) 150 MCG tablet Take 1 tablet (150 mcg total) by mouth daily before breakfast. 90 tablet 3   loratadine (CLARITIN) 10 MG tablet Take 10 mg by mouth daily as needed for allergies.     megestrol (MEGACE) 20 MG tablet TAKE 1 TABLET BY MOUTH DAILY 30 tablet 1   Multiple Vitamins-Minerals (CENTRUM SILVER 50+MEN) TABS Take 1 tablet by mouth daily with breakfast.     mupirocin ointment (BACTROBAN) 2 % Place 1 Application into the nose daily as needed (sores).     OLANZapine (ZYPREXA) 5 MG tablet Take 1 tablet (5 mg total) by mouth at bedtime. 90 tablet 0   posaconazole (NOXAFIL) 100 MG TBEC delayed-release tablet Take 3 tablets (300 mg  total) by mouth daily. 42 tablet 2   potassium chloride 20 MEQ/15ML (10%) SOLN Take 30 mLs (40 mEq total) by mouth daily for 10 days. 300 mL 0   traZODone (DESYREL) 150 MG tablet Take 150 mg by mouth at bedtime as needed for sleep.     VITAMIN D PO Take 1 capsule by mouth daily.     No current facility-administered medications for this visit.    SURGICAL HISTORY:  Past Surgical History:  Procedure Laterality Date   APPENDECTOMY  age 89   ARTERY BIOPSY Left 04/10/2022   Procedure: LEFT TEMPORAL ARTERY BIOPSY;  Surgeon: Chuck Hint, MD;  [facility];  Service: Vascular;  Laterality: Left;   CHOLECYSTECTOMY  10/18/1978   EYE SURGERY Bilateral 2019   cataract   LAPAROSCOPIC LYSIS OF ADHESIONS N/A 08/01/2015   Procedure: LAPAROSCOPIC LYSIS OF ADHESIONS;  Surgeon: Karie Soda, MD;  Location: [facility] ORS;  Service: General;  Laterality: N/A;   LUNG LOBECTOMY  02/16/1990   lung cancer- patient has staples in lung not to have MRI per patient    MAXILLARY ANTROSTOMY Left 05/22/2022   Procedure: MAXILLARY ANTROSTOMY;  Surgeon: Laren Boom, DO;  Location: [facility] OR;  Service: ENT;  Laterality: Left;   PILONIDAL CYST EXCISION  02/16/2009   Dr  Gerrit Friends   ROBOTIC ASSITED PARTIAL NEPHRECTOMY Right 02/01/2015   Procedure: RIGHT ROBOTIC ASSISTED LAPAROSOCOPY NEPHRECTOMY;  Surgeon: Crist Fat, MD;  Location: [facility] ORS;  Service: Urology;  Laterality: Right;   SINUS ENDO WITH FUSION Left 05/22/2022   Procedure: SINUS ENDO WITH FUSION;  Surgeon: Laren Boom, DO;  Location: [facility] OR;  Service: ENT;  Laterality: Left;   VENTRAL HERNIA REPAIR N/A 08/01/2015   Procedure: LAPAROSCOPIC VENTRAL WALL HERNIA WITH MESH;  Surgeon: Karie Soda, MD;  Location: [facility] ORS;  Service: General;  Laterality: N/A;    REVIEW OF SYSTEMS:   Review of Systems  Constitutional: Positive for fatigue, generalized weakness, low appetite, and weight gain.  Positive for fever.   HENT:  Negative for mouth sores, nosebleeds, sore throat and trouble swallowing.   Eyes: Negative for eye problems and icterus.  Respiratory: Positive for dyspnea with exertion. Negative for cough, hemoptysis, and wheezing.   Cardiovascular: Negative for chest pain (none at this time but reportedly a few days ago). Positive for bilateral pitting edema.  Gastrointestinal: Negative for abdominal pain, constipation, diarrhea, nausea and vomiting.  Genitourinary: Negative for bladder incontinence, difficulty urinating, dysuria, frequency and hematuria.   Musculoskeletal: Negative for back pain, gait problem, neck pain and neck stiffness.  Skin: Negative for itching and rash.  Neurological: Positive for generalized weakness. Negative for dizziness, extremity weakness, gait problem, headaches, light-headedness and seizures.  Hematological: Negative for adenopathy. Does not bruise/bleed easily.  Psychiatric/Behavioral: Negative for confusion, depression and sleep disturbance. The patient is not nervous/anxious.     PHYSICAL EXAMINATION:  Blood pressure (!) 174/79, pulse 93, temperature (!) 100.5 F (38.1 C), temperature source Oral, resp. rate 17, weight 161 lb 14.4 oz (73.4 kg), SpO2  96%.  ECOG PERFORMANCE STATUS: 2-3  Physical Exam  Constitutional: Oriented to person, place, and time and well-developed, well-nourished, and in no distress.  HENT:  Head: Normocephalic and atraumatic.  Mouth/Throat: Oropharynx is clear and moist. No oropharyngeal exudate.  Eyes: Conjunctivae are normal. Right eye exhibits no discharge. Left eye exhibits no discharge. No scleral icterus.  Neck: Normal range of motion. Neck supple.  Cardiovascular: Normal rate, regular rhythm, normal heart sounds and intact  distal pulses.   Pulmonary/Chest: Effort normal and breath sounds normal. No respiratory distress. No wheezes. No rales.  Abdominal: Soft. Bowel sounds are normal. Exhibits no distension and no mass. There is no tenderness.  Musculoskeletal: Normal range of motion. Lower extremity edema and extremity swelling Lymphadenopathy:    No cervical adenopathy.  Neurological: Alert and oriented to person, place, and time. Exhibits normal muscle tone. Examined in the wheelchair.  Skin: Skin is warm and dry. Positive for pallor. No rash noted. Not diaphoretic. No erythema. Psychiatric: Mood, memory and judgment normal.  Vitals reviewed.  LABORATORY DATA: Lab Results  Component Value Date   WBC 15.4 (H) 12/08/2022   HGB 7.8 (L) 12/08/2022   HCT 27.7 (L) 12/08/2022   MCV 78.0 (L) 12/08/2022   PLT 520 (H) 12/08/2022      Chemistry      Component Value Date/Time   NA 140 12/08/2022 0928   NA 141 08/11/2021 1252   K 2.6 (LL) 12/08/2022 0928   CL 93 (L) 12/08/2022 0928   CO2 38 (H) 12/08/2022 0928   BUN 8 12/08/2022 0928   BUN 14 08/11/2021 1252   CREATININE 0.84 12/08/2022 0928   CREATININE 0.99 12/03/2022 1053   CREATININE 0.97 10/12/2022 1022      Component Value Date/Time   CALCIUM 9.0 12/08/2022 0928   ALKPHOS 175 (H) 12/08/2022 0928   AST 13 (L) 12/08/2022 0928   AST 9 (L) 12/03/2022 1053   ALT 13 12/08/2022 0928   ALT 9 12/03/2022 1053   BILITOT 0.5 12/08/2022 0928    BILITOT 0.4 12/03/2022 1053       RADIOGRAPHIC STUDIES:  DG Pelvis Portable  Result Date: 11/27/2022 CLINICAL DATA:  Fall, right side pain EXAM: PORTABLE PELVIS 1-2 VIEWS COMPARISON:  None Available. FINDINGS: No acute bony abnormality. Specifically, no fracture, subluxation, or dislocation. Hip joints and SI joints symmetric. Degenerative changes in the visualized lower lumbar spine. IMPRESSION: No acute bony abnormality. Electronically Signed   By: Charlett Nose M.D.   On: 11/27/2022 23:17   DG Chest Port 1 View  Result Date: 11/27/2022 CLINICAL DATA:  Trauma EXAM: PORTABLE CHEST 1 VIEW COMPARISON:  07/13/2022 FINDINGS: Postsurgical changes of the right thorax with chronic scarring and volume loss. Mild cardiomegaly. No acute airspace disease or pneumothorax. IMPRESSION: No active disease. Postsurgical changes of the right thorax. Electronically Signed   By: Jasmine Pang M.D.   On: 11/27/2022 23:16   CT CERVICAL SPINE WO CONTRAST  Result Date: 11/27/2022 CLINICAL DATA:  Larey Seat face forward and bent over trying to her grandson. Daughter witnessed fall. Hit forehead with multiple skin tears. Patient is on Eliquis. EXAM: CT CERVICAL SPINE WITHOUT CONTRAST TECHNIQUE: Multidetector CT imaging of the cervical spine was performed without intravenous contrast. Multiplanar CT image reconstructions were also generated. RADIATION DOSE REDUCTION: This exam was performed according to the departmental dose-optimization program which includes automated exposure control, adjustment of the mA and/or kV according to patient size and/or use of iterative reconstruction technique. COMPARISON:  None Available. FINDINGS: Alignment: No evidence of traumatic malalignment. Mild anterolisthesis of C5 chronic. Skull base and vertebrae: No acute fracture. No primary bone lesion or focal pathologic process. Soft tissues and spinal canal: No prevertebral fluid or swelling. No visible canal hematoma. Disc levels: Mild age  related spondylosis and facet arthropathy. No severe spinal canal Upper chest: No acute abnormality. Other: Carotid calcification. IMPRESSION: No cervical spine fracture. Electronically Signed   By: Minerva Fester M.D.   On: 11/27/2022  23:12   CT HEAD WO CONTRAST  Result Date: 11/27/2022 CLINICAL DATA:  Trauma EXAM: CT HEAD WITHOUT CONTRAST TECHNIQUE: Contiguous axial images were obtained from the base of the skull through the vertex without intravenous contrast. RADIATION DOSE REDUCTION: This exam was performed according to the departmental dose-optimization program which includes automated exposure control, adjustment of the mA and/or kV according to patient size and/or use of iterative reconstruction technique. COMPARISON:  Maxillofacial CT 09/03/2022.  CT of the head 07/13/2022 FINDINGS: Brain: No evidence of acute infarction, hemorrhage, hydrocephalus, extra-axial collection or mass lesion/mass effect. Again seen is mild diffuse atrophy. Vascular: Atherosclerotic calcifications are present within the cavernous internal carotid arteries. Skull: Normal. Negative for fracture or focal lesion. Sinuses/Orbits: Osseous destruction of the left maxillary sinus wall partially imaged. No air-fluid levels are seen. Otherwise, paranasal sinuses, mastoid air cells and orbits are within normal limits. Other: None. IMPRESSION: No acute intracranial process. Electronically Signed   By: Darliss Cheney M.D.   On: 11/27/2022 23:10     ASSESSMENT/PLAN:  This is a very pleasant 77 year old Caucasian male with Stage IV clear-cell renal cell carcinoma diagnosed initially in 2016 as localized T1 a disease with evidence of metastatic disease to the liver and omentum in 2019. The patient underwent the following treatment:   1) status post radical right renal nephrectomy under the care of Dr. Marlou Porch on February 01, 2015 and the final pathology revealed 3.3 cm clear renal cell carcinoma with Fuhrman grade 3. 2) status post  omental biopsy in July 2019 that confirmed the presence of recurrent metastatic renal cell carcinoma. 3) status post treatment with immunotherapy with ipilimumab 1 Mg/KG and nivolumab 3 mg/KG every 3 weeks started 09/21/2017 status post 4 cycles and the patient did not receive any additional immunotherapy secondary to adrenal insufficiency and panhypopituitarism 4) status post radiotherapy to the abdominal lymph node completed November 08, 2019.  He was on treatment with Cabometyx 20 mg p.o. daily started March 2023. This supposed to be on hold while he had sinus issues and concern for fungal infection. He was on treatment with voriconazole by infectious disease.  He was supposed to be holding his treatment with Cabometyx but his daughter mentioned that the patient had been taking it regularly. This was disontinued in June 2024 due to disease progrsesion.    His CT scan showed abdomen including the pancreatic mass as well as peripancreatic and retroperitoneal lymphadenopathy as well as liver lesions.   Dr. Arbutus Ped recommended treatment with axitinib 2 mg BID. He started this on 07/29/22 Because of the drug to drug interaction with posaconazole, his dose is 2 mg p.o. twice daily. This was discontinued on 11/12/22 due to disease progression.   The patient saw Dr. Clarene Duke for further recommendations. Due to DDI with afinitor and his antifungal medication and concerns with tolerability with TKI with lenvatinib, she recommended 120 mg daily of belzutifan. Monitor CBC and CMP at least every 4 weeks, consider ESA to maintain Hbg, and repeat CT scan after 12 weeks due to the response of belzutifan being somewhat delayed.   The patient was seen with Dr. Arbutus Ped today (12/08/22). He reviewed this with the patient. He is in agreement with this plan. He will talk to the chemotherapy pharmacist today.   In order to start treatment with belzutifan, his hemoglobin has to be greater than 8.  Therefore would recommend a  blood transfusion.  I reviewed his iron studies and ferritin with Dr. Arbutus Ped.  This is likely  secondary to inflammation/chronic disease but Dr. Arbutus Ped would recommend iron infusions with Venofer 300 mg weekly x 3 which I can arrange for the first dose to be given next week.  He continues to have significant hypokalemia with nonspecific ST changes.  His potassium is 2.6 today.  He also had an episode of chest pain recently (none at this time).   He also has low-grade fever 100.5 in addition to generalized swelling, failure to thrive, generalized weakness, and fatigue.  We discussed the options today including outpatient support (40 mill equivalents of IV potassium followed by p.o. potassium, 1 unit of blood, and iron infusions starting next week) with close monitoring. However, we did discuss alternative of ER evaluation as the patient and his daughter were contemplating going to the ER due to his symptoms. The patient and his daughter are more comfortable being evaluated in the ER as they were considering going to the emergency room prior to coming to his appointment today.  The patient also lives alone.   Therefore, we will call her to the ER to get him a bed for further evaluation.  I did order 1 unit of blood which we were planning on administering in the clinic today and we will check to see if he can have this administered while being evaluated in the emergency room.   Dr. Arbutus Ped would hold off on any ESA's at this time but we can revisit this at his next appointment.  Will send him a prescription of outpatient p.o. potassium depending on if he gets admitted or not.  Will see him back for follow-up visit in approximately 2 weeks for repeat blood work and to manage any adverse side effects of his new treatment.  However, I think it be a good idea to monitor his labs closely on a weekly basis.  The patient was advised to call immediately if he has any concerning symptoms in the interval. The  patient voices understanding of current disease status and treatment options and is in agreement with the current care plan. All questions were answered. The patient knows to call the clinic with any problems, questions or concerns. We can certainly see the patient much sooner if necessary   Orders Placed This Encounter  Procedures   CBC with Differential (Cancer Center Only)    Standing Status:   Standing    Number of Occurrences:   3    Standing Expiration Date:   12/08/2023   CMP (Cancer Center only)    Standing Status:   Standing    Number of Occurrences:   3    Standing Expiration Date:   12/08/2023   EKG 12-Lead   EKG 12-Lead    Ordered by an unspecified provider    EKG 12-Lead    Ordered by an unspecified provider       Nadya Hopwood L Jessikah Dicker, PA-C 12/08/22  ADDENDUM: Hematology/Oncology Attending: I had a face-to-face encounter with the patient today.  I reviewed his record, lab and recommended his care plan.  This is a very pleasant 77 years old white male with a stage IV clear-cell renal cell carcinoma diagnosed in 2016 initially as localized T1 a disease but had evidence for metastatic disease to the liver and omentum in 2019.  The patient underwent several treatment regimens in the past and most recently treated with axitinib 2 mg twice a day discontinued in September 2024 secondary to disease progression.  The patient was referred to Dr. Clarene Duke at [facility] for a second  opinion regarding his condition and she discussed with him several options including treatment with lenvatinib +/- everolimus but he is currently on posaconazole and there is significant drug drug interactions with everolimus.  She also discussed with him the alternative treatment with belzutifan 120 mg p.o. daily.  The patient and his daughter were interested in this treatment and expected to start next week but today he has been complaining of significant fatigue and weakness that has been going  on for several months but has been worse recently and he gained a lot of weight.  His potassium also has been very low down to 2.1 treated with oral potassium the last few days.  He also complained of low-grade fever.  The patient did not feel well enough to go home and he requested to be transferred to the emergency department for further evaluation.  CBC in the clinic showed low hemoglobin of 7.8 with evidence of iron deficiency despite the elevated ferritin level over 3000 which is an acute phase reactant. We recommended for the patient to go to the emergency department for further evaluation and probably admission for management of his electrolyte imbalance as well as the anemia and evaluation by cardiology. We will see him back for follow-up visit after discharge for evaluation before starting his treatment with belzutifan. The patient and his daughter were advised to call if he has any other issues after discharge. The total time spent in the appointment was 30 minutes. Disclaimer: This note was dictated with voice recognition software. Similar sounding words can inadvertently be transcribed and may be missed upon review. Lajuana Matte, MD

## 2022-12-08 ENCOUNTER — Inpatient Hospital Stay: Payer: Medicare HMO | Admitting: Physician Assistant

## 2022-12-08 ENCOUNTER — Inpatient Hospital Stay (HOSPITAL_COMMUNITY)
Admission: EM | Admit: 2022-12-08 | Discharge: 2022-12-11 | DRG: 436 | Disposition: A | Discharge status: Home / Self Care | Payer: Medicare HMO | Source: Ambulatory Visit | Attending: Internal Medicine | Admitting: Internal Medicine

## 2022-12-08 ENCOUNTER — Encounter (HOSPITAL_COMMUNITY): Payer: Self-pay | Admitting: Internal Medicine

## 2022-12-08 ENCOUNTER — Inpatient Hospital Stay: Payer: Medicare HMO

## 2022-12-08 ENCOUNTER — Emergency Department (HOSPITAL_COMMUNITY): Payer: Medicare HMO

## 2022-12-08 ENCOUNTER — Other Ambulatory Visit: Payer: Self-pay

## 2022-12-08 ENCOUNTER — Telehealth: Payer: Self-pay | Admitting: Pharmacist

## 2022-12-08 ENCOUNTER — Telehealth: Payer: Self-pay

## 2022-12-08 ENCOUNTER — Other Ambulatory Visit: Payer: Self-pay | Admitting: Physician Assistant

## 2022-12-08 ENCOUNTER — Other Ambulatory Visit (HOSPITAL_COMMUNITY): Payer: Self-pay

## 2022-12-08 ENCOUNTER — Telehealth: Payer: Self-pay | Admitting: Pharmacy Technician

## 2022-12-08 VITALS — BP 174/79 | HR 93 | Temp 100.5°F | Resp 17 | Wt 161.9 lb

## 2022-12-08 DIAGNOSIS — Z85118 Personal history of other malignant neoplasm of bronchus and lung: Secondary | ICD-10-CM

## 2022-12-08 DIAGNOSIS — C787 Secondary malignant neoplasm of liver and intrahepatic bile duct: Principal | ICD-10-CM | POA: Diagnosis present

## 2022-12-08 DIAGNOSIS — E785 Hyperlipidemia, unspecified: Secondary | ICD-10-CM | POA: Diagnosis present

## 2022-12-08 DIAGNOSIS — C649 Malignant neoplasm of unspecified kidney, except renal pelvis: Secondary | ICD-10-CM | POA: Diagnosis present

## 2022-12-08 DIAGNOSIS — Z8249 Family history of ischemic heart disease and other diseases of the circulatory system: Secondary | ICD-10-CM

## 2022-12-08 DIAGNOSIS — Z85528 Personal history of other malignant neoplasm of kidney: Secondary | ICD-10-CM

## 2022-12-08 DIAGNOSIS — R6510 Systemic inflammatory response syndrome (SIRS) of non-infectious origin without acute organ dysfunction: Secondary | ICD-10-CM | POA: Diagnosis present

## 2022-12-08 DIAGNOSIS — Z8701 Personal history of pneumonia (recurrent): Secondary | ICD-10-CM

## 2022-12-08 DIAGNOSIS — Z79899 Other long term (current) drug therapy: Secondary | ICD-10-CM

## 2022-12-08 DIAGNOSIS — E876 Hypokalemia: Secondary | ICD-10-CM | POA: Diagnosis not present

## 2022-12-08 DIAGNOSIS — R627 Adult failure to thrive: Secondary | ICD-10-CM | POA: Diagnosis not present

## 2022-12-08 DIAGNOSIS — E039 Hypothyroidism, unspecified: Secondary | ICD-10-CM | POA: Diagnosis not present

## 2022-12-08 DIAGNOSIS — C786 Secondary malignant neoplasm of retroperitoneum and peritoneum: Secondary | ICD-10-CM | POA: Diagnosis present

## 2022-12-08 DIAGNOSIS — Z888 Allergy status to other drugs, medicaments and biological substances status: Secondary | ICD-10-CM

## 2022-12-08 DIAGNOSIS — I129 Hypertensive chronic kidney disease with stage 1 through stage 4 chronic kidney disease, or unspecified chronic kidney disease: Secondary | ICD-10-CM | POA: Diagnosis present

## 2022-12-08 DIAGNOSIS — R9389 Abnormal findings on diagnostic imaging of other specified body structures: Secondary | ICD-10-CM | POA: Diagnosis not present

## 2022-12-08 DIAGNOSIS — A419 Sepsis, unspecified organism: Secondary | ICD-10-CM | POA: Diagnosis not present

## 2022-12-08 DIAGNOSIS — B4489 Other forms of aspergillosis: Secondary | ICD-10-CM | POA: Diagnosis present

## 2022-12-08 DIAGNOSIS — Z79890 Hormone replacement therapy (postmenopausal): Secondary | ICD-10-CM | POA: Diagnosis not present

## 2022-12-08 DIAGNOSIS — Z1152 Encounter for screening for COVID-19: Secondary | ICD-10-CM | POA: Diagnosis not present

## 2022-12-08 DIAGNOSIS — M1990 Unspecified osteoarthritis, unspecified site: Secondary | ICD-10-CM | POA: Diagnosis not present

## 2022-12-08 DIAGNOSIS — D631 Anemia in chronic kidney disease: Secondary | ICD-10-CM | POA: Diagnosis present

## 2022-12-08 DIAGNOSIS — I10 Essential (primary) hypertension: Secondary | ICD-10-CM | POA: Diagnosis present

## 2022-12-08 DIAGNOSIS — E869 Volume depletion, unspecified: Secondary | ICD-10-CM | POA: Diagnosis present

## 2022-12-08 DIAGNOSIS — I2510 Atherosclerotic heart disease of native coronary artery without angina pectoris: Secondary | ICD-10-CM | POA: Diagnosis present

## 2022-12-08 DIAGNOSIS — G2581 Restless legs syndrome: Secondary | ICD-10-CM | POA: Diagnosis not present

## 2022-12-08 DIAGNOSIS — Z87891 Personal history of nicotine dependence: Secondary | ICD-10-CM

## 2022-12-08 DIAGNOSIS — D649 Anemia, unspecified: Secondary | ICD-10-CM

## 2022-12-08 DIAGNOSIS — D509 Iron deficiency anemia, unspecified: Secondary | ICD-10-CM

## 2022-12-08 DIAGNOSIS — R6884 Jaw pain: Secondary | ICD-10-CM

## 2022-12-08 DIAGNOSIS — D638 Anemia in other chronic diseases classified elsewhere: Secondary | ICD-10-CM | POA: Diagnosis present

## 2022-12-08 DIAGNOSIS — E2740 Unspecified adrenocortical insufficiency: Secondary | ICD-10-CM | POA: Diagnosis not present

## 2022-12-08 DIAGNOSIS — J342 Deviated nasal septum: Secondary | ICD-10-CM | POA: Diagnosis not present

## 2022-12-08 DIAGNOSIS — Z9049 Acquired absence of other specified parts of digestive tract: Secondary | ICD-10-CM

## 2022-12-08 DIAGNOSIS — Z7901 Long term (current) use of anticoagulants: Secondary | ICD-10-CM

## 2022-12-08 DIAGNOSIS — J320 Chronic maxillary sinusitis: Secondary | ICD-10-CM | POA: Diagnosis present

## 2022-12-08 DIAGNOSIS — I480 Paroxysmal atrial fibrillation: Secondary | ICD-10-CM | POA: Diagnosis not present

## 2022-12-08 DIAGNOSIS — N1831 Chronic kidney disease, stage 3a: Secondary | ICD-10-CM | POA: Diagnosis present

## 2022-12-08 DIAGNOSIS — C641 Malignant neoplasm of right kidney, except renal pelvis: Secondary | ICD-10-CM

## 2022-12-08 DIAGNOSIS — E872 Acidosis, unspecified: Secondary | ICD-10-CM | POA: Diagnosis not present

## 2022-12-08 DIAGNOSIS — Z905 Acquired absence of kidney: Secondary | ICD-10-CM

## 2022-12-08 DIAGNOSIS — K219 Gastro-esophageal reflux disease without esophagitis: Secondary | ICD-10-CM | POA: Diagnosis present

## 2022-12-08 DIAGNOSIS — R0602 Shortness of breath: Secondary | ICD-10-CM | POA: Diagnosis not present

## 2022-12-08 LAB — CBC WITH DIFFERENTIAL/PLATELET
Abs Immature Granulocytes: 0.17 10*3/uL — ABNORMAL HIGH (ref 0.00–0.07)
Basophils Absolute: 0 10*3/uL (ref 0.0–0.1)
Basophils Relative: 0 %
Eosinophils Absolute: 0.1 10*3/uL (ref 0.0–0.5)
Eosinophils Relative: 1 %
HCT: 27.7 % — ABNORMAL LOW (ref 39.0–52.0)
Hemoglobin: 7.8 g/dL — ABNORMAL LOW (ref 13.0–17.0)
Immature Granulocytes: 1 %
Lymphocytes Relative: 8 %
Lymphs Abs: 1.3 10*3/uL (ref 0.7–4.0)
MCH: 22 pg — ABNORMAL LOW (ref 26.0–34.0)
MCHC: 28.2 g/dL — ABNORMAL LOW (ref 30.0–36.0)
MCV: 78 fL — ABNORMAL LOW (ref 80.0–100.0)
Monocytes Absolute: 1.1 10*3/uL — ABNORMAL HIGH (ref 0.1–1.0)
Monocytes Relative: 7 %
Neutro Abs: 12.8 10*3/uL — ABNORMAL HIGH (ref 1.7–7.7)
Neutrophils Relative %: 83 %
Platelets: 520 10*3/uL — ABNORMAL HIGH (ref 150–400)
RBC: 3.55 MIL/uL — ABNORMAL LOW (ref 4.22–5.81)
RDW: 20.1 % — ABNORMAL HIGH (ref 11.5–15.5)
WBC: 15.4 10*3/uL — ABNORMAL HIGH (ref 4.0–10.5)
nRBC: 0 % (ref 0.0–0.2)

## 2022-12-08 LAB — URINALYSIS, W/ REFLEX TO CULTURE (INFECTION SUSPECTED)
Bacteria, UA: NONE SEEN
Bilirubin Urine: NEGATIVE
Glucose, UA: 50 mg/dL — AB
Hgb urine dipstick: NEGATIVE
Ketones, ur: NEGATIVE mg/dL
Leukocytes,Ua: NEGATIVE
Nitrite: NEGATIVE
Protein, ur: 100 mg/dL — AB
Specific Gravity, Urine: 1.011 (ref 1.005–1.030)
pH: 7 (ref 5.0–8.0)

## 2022-12-08 LAB — FOLATE: Folate: 8.1 ng/mL (ref >5.9)

## 2022-12-08 LAB — SARS CORONAVIRUS 2 BY RT PCR: SARS Coronavirus 2 by RT PCR: NEGATIVE

## 2022-12-08 LAB — PHOSPHORUS: Phosphorus: 2.5 mg/dL (ref 2.5–4.6)

## 2022-12-08 LAB — CBC
HCT: 30 % — ABNORMAL LOW (ref 39.0–52.0)
Hemoglobin: 8.5 g/dL — ABNORMAL LOW (ref 13.0–17.0)
MCH: 23 pg — ABNORMAL LOW (ref 26.0–34.0)
MCHC: 28.3 g/dL — ABNORMAL LOW (ref 30.0–36.0)
MCV: 81.3 fL (ref 80.0–100.0)
Platelets: 411 10*3/uL — ABNORMAL HIGH (ref 150–400)
RBC: 3.69 MIL/uL — ABNORMAL LOW (ref 4.22–5.81)
RDW: 19.3 % — ABNORMAL HIGH (ref 11.5–15.5)
WBC: 12 10*3/uL — ABNORMAL HIGH (ref 4.0–10.5)
nRBC: 0 % (ref 0.0–0.2)

## 2022-12-08 LAB — PREPARE RBC (CROSSMATCH)

## 2022-12-08 LAB — BASIC METABOLIC PANEL
Anion gap: 12 (ref 5–15)
BUN: 10 mg/dL (ref 8–23)
CO2: 32 mmol/L (ref 22–32)
Calcium: 8 mg/dL — ABNORMAL LOW (ref 8.9–10.3)
Chloride: 93 mmol/L — ABNORMAL LOW (ref 98–111)
Creatinine, Ser: 0.94 mg/dL (ref 0.61–1.24)
GFR, Estimated: >60 mL/min (ref >60)
Glucose, Bld: 153 mg/dL — ABNORMAL HIGH (ref 70–99)
Potassium: 2.6 mmol/L — CL (ref 3.5–5.1)
Sodium: 137 mmol/L (ref 135–145)

## 2022-12-08 LAB — COMPREHENSIVE METABOLIC PANEL
ALT: 13 U/L (ref 0–44)
AST: 13 U/L — ABNORMAL LOW (ref 15–41)
Albumin: 3 g/dL — ABNORMAL LOW (ref 3.5–5.0)
Alkaline Phosphatase: 175 U/L — ABNORMAL HIGH (ref 38–126)
Anion gap: 9 (ref 5–15)
BUN: 8 mg/dL (ref 8–23)
CO2: 38 mmol/L — ABNORMAL HIGH (ref 22–32)
Calcium: 9 mg/dL (ref 8.9–10.3)
Chloride: 93 mmol/L — ABNORMAL LOW (ref 98–111)
Creatinine, Ser: 0.84 mg/dL (ref 0.61–1.24)
GFR, Estimated: >60 mL/min (ref >60)
Glucose, Bld: 202 mg/dL — ABNORMAL HIGH (ref 70–99)
Potassium: 2.6 mmol/L — CL (ref 3.5–5.1)
Sodium: 140 mmol/L (ref 135–145)
Total Bilirubin: 0.5 mg/dL (ref 0.3–1.2)
Total Protein: 6.7 g/dL (ref 6.5–8.1)

## 2022-12-08 LAB — I-STAT CG4 LACTIC ACID, ED
Lactic Acid, Venous: 2.4 mmol/L (ref 0.5–1.9)
Lactic Acid, Venous: 1.2 mmol/L (ref 0.5–1.9)

## 2022-12-08 LAB — VITAMIN B12: Vitamin B-12: 550 pg/mL (ref 180–914)

## 2022-12-08 LAB — MAGNESIUM: Magnesium: 1.7 mg/dL (ref 1.7–2.4)

## 2022-12-08 LAB — SAMPLE TO BLOOD BANK

## 2022-12-08 MED ORDER — HEPARIN SOD (PORK) LOCK FLUSH 100 UNIT/ML IV SOLN
500.0000 [IU] | Freq: Every day | INTRAVENOUS | Status: DC | PRN
Start: 2022-12-08 — End: 2022-12-11

## 2022-12-08 MED ORDER — ACETAMINOPHEN 650 MG RE SUPP: 650.0000 mg | Freq: Four times a day (QID) | RECTAL | Status: DC | PRN

## 2022-12-08 MED ORDER — MUPIROCIN 2 % EX OINT: 1.0000 | TOPICAL_OINTMENT | Freq: Every day | CUTANEOUS | Status: DC | PRN

## 2022-12-08 MED ORDER — ONDANSETRON HCL 4 MG PO TABS: 4.0000 mg | ORAL_TABLET | Freq: Four times a day (QID) | ORAL | Status: DC | PRN

## 2022-12-08 MED ORDER — METRONIDAZOLE 500 MG/100ML IV SOLN
500.0000 mg | Freq: Two times a day (BID) | INTRAVENOUS | Status: DC
  Administered 2022-12-08 – 2022-12-09 (×2): 500 mg via INTRAVENOUS
  Filled 2022-12-08 (×2): qty 100

## 2022-12-08 MED ORDER — VANCOMYCIN HCL 1500 MG/300ML IV SOLN
1500.0000 mg | Freq: Once | INTRAVENOUS | Status: AC
  Administered 2022-12-08: 1500 mg via INTRAVENOUS
  Filled 2022-12-08: qty 300

## 2022-12-08 MED ORDER — POSACONAZOLE 100 MG PO TBEC
300.0000 mg | DELAYED_RELEASE_TABLET | Freq: Every day | ORAL | Status: DC
  Administered 2022-12-08 – 2022-12-11 (×4): 300 mg via ORAL
  Filled 2022-12-08 (×4): qty 3

## 2022-12-08 MED ORDER — ACETAMINOPHEN 325 MG PO TABS: 650.0000 mg | ORAL_TABLET | Freq: Four times a day (QID) | ORAL | Status: DC | PRN

## 2022-12-08 MED ORDER — HYDROCORTISONE 10 MG PO TABS
5.0000 mg | ORAL_TABLET | Freq: Every day | ORAL | Status: DC
  Administered 2022-12-08 – 2022-12-10 (×3): 5 mg via ORAL
  Filled 2022-12-08 (×3): qty 1

## 2022-12-08 MED ORDER — DIAZEPAM 2 MG PO TABS: 2.0000 mg | ORAL_TABLET | Freq: Three times a day (TID) | ORAL | Status: DC | PRN

## 2022-12-08 MED ORDER — ACETAMINOPHEN 325 MG PO TABS
650.0000 mg | ORAL_TABLET | Freq: Once | ORAL | Status: AC
  Administered 2022-12-08: 650 mg via ORAL
  Filled 2022-12-08: qty 2

## 2022-12-08 MED ORDER — HYDROCORTISONE 10 MG PO TABS
20.0000 mg | ORAL_TABLET | Freq: Every day | ORAL | Status: DC
  Administered 2022-12-09 – 2022-12-11 (×3): 20 mg via ORAL
  Filled 2022-12-08 (×3): qty 2

## 2022-12-08 MED ORDER — SODIUM CHLORIDE 0.9 % IV SOLN
2.0000 g | Freq: Three times a day (TID) | INTRAVENOUS | Status: DC
  Administered 2022-12-08 – 2022-12-11 (×8): 2 g via INTRAVENOUS
  Filled 2022-12-08 (×9): qty 12.5

## 2022-12-08 MED ORDER — POTASSIUM CHLORIDE 10 MEQ/100ML IV SOLN
10.0000 meq | INTRAVENOUS | Status: AC
  Administered 2022-12-08 (×3): 10 meq via INTRAVENOUS
  Filled 2022-12-08 (×3): qty 100

## 2022-12-08 MED ORDER — POTASSIUM CHLORIDE 10 MEQ/100ML IV SOLN
10.0000 meq | INTRAVENOUS | Status: AC
  Administered 2022-12-08 – 2022-12-09 (×4): 10 meq via INTRAVENOUS
  Filled 2022-12-08 (×4): qty 100

## 2022-12-08 MED ORDER — HYDRALAZINE HCL 20 MG/ML IJ SOLN
20.0000 mg | Freq: Once | INTRAMUSCULAR | Status: AC
  Administered 2022-12-08: 20 mg via INTRAVENOUS
  Filled 2022-12-08: qty 1

## 2022-12-08 MED ORDER — DILTIAZEM HCL ER COATED BEADS 240 MG PO CP24
240.0000 mg | ORAL_CAPSULE | Freq: Every day | ORAL | Status: DC
  Administered 2022-12-08 – 2022-12-11 (×4): 240 mg via ORAL
  Filled 2022-12-08 (×4): qty 1

## 2022-12-08 MED ORDER — LEVOTHYROXINE SODIUM 75 MCG PO TABS
150.0000 ug | ORAL_TABLET | Freq: Every day | ORAL | Status: DC
  Administered 2022-12-09 – 2022-12-11 (×3): 150 ug via ORAL
  Filled 2022-12-08 (×3): qty 2

## 2022-12-08 MED ORDER — APIXABAN 2.5 MG PO TABS
2.5000 mg | ORAL_TABLET | Freq: Two times a day (BID) | ORAL | Status: DC
  Administered 2022-12-08 – 2022-12-11 (×6): 2.5 mg via ORAL
  Filled 2022-12-08 (×6): qty 1

## 2022-12-08 MED ORDER — POTASSIUM CHLORIDE 20 MEQ PO PACK
40.0000 meq | PACK | Freq: Once | ORAL | Status: AC
  Administered 2022-12-08: 40 meq via ORAL
  Filled 2022-12-08: qty 2

## 2022-12-08 MED ORDER — SODIUM CHLORIDE 0.9% IV SOLUTION: 250.0000 mL | INTRAVENOUS | Status: DC

## 2022-12-08 MED ORDER — SODIUM CHLORIDE 0.9% IV SOLUTION: Freq: Once | INTRAVENOUS | Status: AC

## 2022-12-08 MED ORDER — POTASSIUM CHLORIDE 10 MEQ/100ML IV SOLN
10.0000 meq | INTRAVENOUS | Status: DC
Start: 2022-12-08 — End: 2022-12-08

## 2022-12-08 MED ORDER — FAMOTIDINE 20 MG PO TABS: 20.0000 mg | ORAL_TABLET | Freq: Two times a day (BID) | ORAL | Status: DC | PRN

## 2022-12-08 MED ORDER — LACTATED RINGERS IV SOLN: INTRAVENOUS | Status: AC

## 2022-12-08 MED ORDER — ONDANSETRON HCL 4 MG/2ML IJ SOLN: 4.0000 mg | Freq: Four times a day (QID) | INTRAMUSCULAR | Status: DC | PRN

## 2022-12-08 MED ORDER — SODIUM CHLORIDE 0.9% FLUSH: 10.0000 mL | INTRAVENOUS | Status: DC | PRN

## 2022-12-08 MED ORDER — HEPARIN SOD (PORK) LOCK FLUSH 100 UNIT/ML IV SOLN: 250.0000 [IU] | INTRAVENOUS | Status: DC | PRN

## 2022-12-08 MED ORDER — FUROSEMIDE 10 MG/ML IJ SOLN
20.0000 mg | Freq: Once | INTRAMUSCULAR | Status: AC
  Administered 2022-12-08: 20 mg via INTRAVENOUS
  Filled 2022-12-08: qty 2

## 2022-12-08 MED ORDER — SODIUM CHLORIDE 0.9 % IV SOLN
2.0000 g | Freq: Once | INTRAVENOUS | Status: AC
  Administered 2022-12-08: 2 g via INTRAVENOUS
  Filled 2022-12-08: qty 12.5

## 2022-12-08 MED ORDER — VANCOMYCIN HCL 750 MG/150ML IV SOLN
750.0000 mg | Freq: Two times a day (BID) | INTRAVENOUS | Status: DC
  Administered 2022-12-09 – 2022-12-10 (×3): 750 mg via INTRAVENOUS
  Filled 2022-12-08 (×3): qty 150

## 2022-12-08 MED ORDER — WELIREG 40 MG PO TABS: 120.0000 mg | ORAL_TABLET | Freq: Every day | ORAL | 3 refills | Status: DC

## 2022-12-08 NOTE — Telephone Encounter (Signed)
Oral Oncology Patient Advocate Encounter  Prior Authorization for [name] has been approved.    PA# [id number] Effective dates: 12/08/22 through 02/16/23  Patients co-pay is $0.   Medication is limited distribution and will be filled at [facility]  [name], CPhT-Adv Oncology Pharmacy Patient Advocate [facility] Direct Number: [phone]  Fax: [phone]

## 2022-12-08 NOTE — Progress Notes (Addendum)
Pharmacy Antibiotic Note  [name] is a 77 y.o. male with renal cell carcinoma, on active chemotherapy, admitted on 12/08/2022 from cancer center with sepsis.  Pharmacy has been consulted for vancomycin and cefepime dosing.  Plan: Vancomycin 1500 mg IV now, then 750 mg IV q12 hr (est AUC 461 based on SCr 0.84; Vd 0.72) Measure vancomycin AUC at steady state as indicated SCr q48 while on vanc Cefepime 2 g IV q8 hr; further dose adjustments per standing pharmacy protocol  Height: 5\' 8"  (172.7 cm) Weight: 73 kg (161 lb) IBW/kg (Calculated) : 68.4  Temp (24hrs), Avg:99.4 F (37.4 C), Min:98.5 F (36.9 C), Max:100.5 F (38.1 C)  Recent Labs  Lab 12/03/22 1053 12/08/22 0928 12/08/22 1201 12/08/22 1427  WBC 11.9* 15.4*  --   --   CREATININE 0.99 0.84  --   --   LATICACIDVEN  --   --  2.4* 1.2    Estimated Creatinine Clearance: 69 mL/min (by C-G formula based on SCr of 0.84 mg/dL).    Allergies  Allergen Reactions   Diphenhydramine Other (See Comments)    Severe restless legs    Antimicrobials this admission: 10/22 vancomycin >>  10/22 cefepime >>  10/22 metronidazole >>  Dose adjustments this admission: N/a  Microbiology results: 10/22 BCx: sent   Thank you for allowing pharmacy to be a part of this patient's care.  Arbie Reisz A 12/08/2022 3:19 PM

## 2022-12-08 NOTE — ED Notes (Signed)
ED TO INPATIENT HANDOFF REPORT  ED Nurse Name and Phone #: Starling Manns Name/Age/Gender [name] 77 y.o. male Room/Bed: WA02/WA02  Code Status   Code Status: Full Code  Home/SNF/Other Home Patient oriented to: self, place, time, and situation Is this baseline? Yes   Triage Complete: Triage complete  Chief Complaint Sepsis due to undetermined organism [facility]) [A41.9]  Triage Note No notes on file   Allergies Allergies  Allergen Reactions   Diphenhydramine Other (See Comments)    Severe restless legs    Level of Care/Admitting Diagnosis ED Disposition     ED Disposition  Admit   Condition  --   Comment  H[facility]: [facility] [[id number]]  Level of Care: Med-Surg [16]  May admit patient to [facility] or [facility] if equivalent level of care is available:: No  Covid Evaluation: Asymptomatic - no recent exposure (last 10 days) testing not required  Diagnosis: Sepsis due to undetermined organism [facility]) [[id number]]  Admitting Physician: Bobette Mo [[id number]]  Attending Physician: Bobette Mo [[id number]]  Certification:: I certify this patient will need inpatient services for at least 2 midnights  Expected Medical Readiness: 12/10/2022          B Medical/Surgery History Past Medical History:  Diagnosis Date   Arthritis    Chronic kidney disease    only has one kidney    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 02/01/2015   Coronary artery disease    followed by Dr.Tilley   Frequent PVCs    GERD (gastroesophageal reflux disease)    Heart murmur    never has caused any problems   Hx of cancer of lung 1980's   Hyperlipidemia    Hypertension    Hypothyroidism    Incisional hernia 08/01/2015   Lung cancer (HCC) 1993   Lung metastases 2019   Pneumonia    x several   Recurrent umbilical hernia 08/01/2015   Past Surgical History:  Procedure Laterality Date   APPENDECTOMY  age 89   ARTERY  BIOPSY Left 04/10/2022   Procedure: LEFT TEMPORAL ARTERY BIOPSY;  Surgeon: Chuck Hint, MD;  Location: [facility] OR;  Service: Vascular;  Laterality: Left;   CHOLECYSTECTOMY  10/18/1978   EYE SURGERY Bilateral 2019   cataract   LAPAROSCOPIC LYSIS OF ADHESIONS N/A 08/01/2015   Procedure: LAPAROSCOPIC LYSIS OF ADHESIONS;  Surgeon: Karie Soda, MD;  Location: [facility] ORS;  Service: General;  Laterality: N/A;   LUNG LOBECTOMY  02/16/1990   lung cancer- patient has staples in lung not to have MRI per patient    MAXILLARY ANTROSTOMY Left 05/22/2022   Procedure: MAXILLARY ANTROSTOMY;  Surgeon: Laren Boom, DO;  Location: [facility] OR;  Service: ENT;  Laterality: Left;   PILONIDAL CYST EXCISION  02/16/2009   Dr Gerrit Friends   ROBOTIC ASSITED PARTIAL NEPHRECTOMY Right 02/01/2015   Procedure: RIGHT ROBOTIC ASSISTED LAPAROSOCOPY NEPHRECTOMY;  Surgeon: Crist Fat, MD;  Location: [facility] ORS;  Service: Urology;  Laterality: Right;   SINUS ENDO WITH FUSION Left 05/22/2022   Procedure: SINUS ENDO WITH FUSION;  Surgeon: Laren Boom, DO;  Location: [facility] OR;  Service: ENT;  Laterality: Left;   VENTRAL HERNIA REPAIR N/A 08/01/2015   Procedure: LAPAROSCOPIC VENTRAL WALL HERNIA WITH MESH;  Surgeon: Karie Soda, MD;  Location: [facility] ORS;  Service: General;  Laterality: N/A;     A IV Location/Drains/Wounds Patient Lines/Drains/Airways Status     Active Line/Drains/Airways  Name Placement date Placement time Site Days   Peripheral IV 12/08/22 20 G Left Antecubital 12/08/22  1151  Antecubital  less than 1   Peripheral IV 12/08/22 20 G Right Forearm 12/08/22  1306  Forearm  less than 1   Incision - 1 Port  --  --  -- --            Intake/Output Last 24 hours No intake or output data in the 24 hours ending 12/08/22 1655  Labs/Imaging Results for orders placed or performed during the hospital encounter of 12/08/22 (from the past 48 hour(s))  Urinalysis, w/ Reflex to Culture (Infection Suspected)  -Urine, Clean Catch     Status: Abnormal   Collection Time: 12/08/22 11:48 AM  Result Value Ref Range   Specimen Source URINE, CLEAN CATCH    Color, Urine YELLOW YELLOW   APPearance CLEAR CLEAR   Specific Gravity, Urine 1.011 1.005 - 1.030   pH 7.0 5.0 - 8.0   Glucose, UA 50 (A) NEGATIVE mg/dL   Hgb urine dipstick NEGATIVE NEGATIVE   Bilirubin Urine NEGATIVE NEGATIVE   Ketones, ur NEGATIVE NEGATIVE mg/dL   Protein, ur 161 (A) NEGATIVE mg/dL   Nitrite NEGATIVE NEGATIVE   Leukocytes,Ua NEGATIVE NEGATIVE   RBC / HPF 0-5 0 - 5 RBC/hpf   WBC, UA 0-5 0 - 5 WBC/hpf    Comment:        Reflex urine culture not performed if WBC <=10, OR if Squamous epithelial cells >5. If Squamous epithelial cells >5 suggest recollection.    Bacteria, UA NONE SEEN NONE SEEN   Squamous Epithelial / HPF 0-5 0 - 5 /HPF   Mucus PRESENT     Comment: Performed at [facility], [address], [place], [place] [address]  SARS Coronavirus 2 by RT PCR (hospital order, performed in [facility] hospital lab) *cepheid single result test* Anterior Nasal Swab     Status: None   Collection Time: 12/08/22 11:48 AM   Specimen: Anterior Nasal Swab  Result Value Ref Range   SARS Coronavirus 2 by RT PCR NEGATIVE NEGATIVE    Comment: (NOTE) SARS-CoV-2 target nucleic acids are NOT DETECTED.  The SARS-CoV-2 RNA is generally detectable in upper and lower respiratory specimens during the acute phase of infection. The lowest concentration of SARS-CoV-2 viral copies this assay can detect is 250 copies / mL. A negative result does not preclude SARS-CoV-2 infection and should not be used as the sole basis for treatment or other patient management decisions.  A negative result may occur with improper specimen collection / handling, submission of specimen other than nasopharyngeal swab, presence of viral mutation(s) within the areas targeted by this assay, and inadequate number of viral copies (<250 copies /  mL). A negative result must be combined with clinical observations, patient history, and epidemiological information.  Fact Sheet for Patients:   RoadLapTop.co.za  Fact Sheet for Healthcare Providers: http://kim-miller.com/  This test is not yet approved or  cleared by the [place] FDA and has been authorized for detection and/or diagnosis of SARS-CoV-2 by FDA under an Emergency Use Authorization (EUA).  This EUA will remain in effect (meaning this test can be used) for the duration of the COVID-19 declaration under Section 564(b)(1) of the Act, 21 U.S.C. section 360bbb-3(b)(1), unless the authorization is terminated or revoked sooner.  Performed at [facility], [address] [place], [place] [address]   I-Stat CG4 Lactic Acid     Status: Abnormal  Collection Time: 12/08/22 12:01 PM  Result Value Ref Range   Lactic Acid, Venous 2.4 (HH) 0.5 - 1.9 mmol/L   Comment NOTIFIED PHYSICIAN   Prepare RBC (crossmatch)     Status: None   Collection Time: 12/08/22 12:30 PM  Result Value Ref Range   Order Confirmation      ORDER PROCESSED BY BLOOD BANK Performed at [facility], [address], [place], [place] [address]   I-Stat CG4 Lactic Acid     Status: None   Collection Time: 12/08/22  2:27 PM  Result Value Ref Range   Lactic Acid, Venous 1.2 0.5 - 1.9 mmol/L   DG Chest Port 1 View  Result Date: 12/08/2022 CLINICAL DATA:  Shortness of breath EXAM: PORTABLE CHEST 1 VIEW COMPARISON:  Chest x-ray 11/27/2022 FINDINGS: There is stable elevation of the right hemidiaphragm with blunting of the right costophrenic angle, likely scarring. Cardiomediastinal silhouette is stable. Surgical clips overlie the right hilum. Left lung is clear. No pneumothorax. No acute fractures are seen. IMPRESSION: No acute findings. Electronically Signed   By: Darliss Cheney M.D.   On: 12/08/2022 15:21    Pending  Labs Unresulted Labs (From admission, onward)     Start     Ordered   12/09/22 0500  CBC with Differential  Daily,   R      12/08/22 1443   12/09/22 0500  Comprehensive metabolic panel  Tomorrow morning,   R        12/08/22 1443   12/08/22 1148  Culture, blood (Routine X 2) w Reflex to ID Panel  BLOOD CULTURE X 2,   R (with STAT occurrences)     Question:  Patient immune status  Answer:  Normal   12/08/22 1147            Vitals/Pain Today's Vitals   12/08/22 1508 12/08/22 1530 12/08/22 1554 12/08/22 1630  BP:  (!) 157/83 (!) 155/62 (!) 153/78  Pulse:  99 99 95  Resp:  (!) 22 20 (!) 22  Temp: 99.2 F (37.3 C)  98.3 F (36.8 C) 98.6 F (37 C)  TempSrc: Oral  Oral Oral  SpO2:  94% 94% 94%  Weight:      Height:      PainSc:        Isolation Precautions Airborne and Contact precautions  Medications Medications  lactated ringers infusion (0 mLs Intravenous Stopped 12/08/22 1619)  0.9 %  sodium chloride infusion (Manually program via Guardrails IV Fluids) (has no administration in time range)  metroNIDAZOLE (FLAGYL) IVPB 500 mg (0 mg Intravenous Stopped 12/08/22 1619)  vancomycin (VANCOREADY) IVPB 1500 mg/300 mL (1,500 mg Intravenous New Bag/Given 12/08/22 1642)  acetaminophen (TYLENOL) tablet 650 mg (has no administration in time range)    Or  acetaminophen (TYLENOL) suppository 650 mg (has no administration in time range)  ondansetron (ZOFRAN) tablet 4 mg (has no administration in time range)    Or  ondansetron (ZOFRAN) injection 4 mg (has no administration in time range)  ceFEPIme (MAXIPIME) 2 g in sodium chloride 0.9 % 100 mL IVPB (has no administration in time range)  vancomycin (VANCOREADY) IVPB 750 mg/150 mL (has no administration in time range)  potassium chloride 10 mEq in 100 mL IVPB (10 mEq Intravenous New Bag/Given 12/08/22 1539)  ceFEPIme (MAXIPIME) 2 g in sodium chloride 0.9 % 100 mL IVPB (0 g Intravenous Stopped 12/08/22 1414)    Mobility Pt has not  walked during my shift  Focused Assessments Pulmonary Assessment Handoff:  Lung sounds:   O2 Device: Room Air      R Recommendations: See Admitting Provider Note  Report given to:   Additional Notes:  Receiving 1 unit RBC

## 2022-12-08 NOTE — ED Notes (Signed)
Pt to ED from cancer center   for follow up visit r/t kidney cancer, reports been off treatment x 1 month.  Cancer center concerned d/t pt had fever 100.5 and hypokalemia. Apparently pt has ongoing problem with low potassium and takes oral.Pt reports  not feeling well for 2 weeks.

## 2022-12-08 NOTE — ED Notes (Signed)
Pt provided "ED HAPPY MEAL" 

## 2022-12-08 NOTE — Telephone Encounter (Signed)
Oral Chemotherapy Pharmacist Encounter  Patient Education I spoke with patient and daughter for overview of new oral chemotherapy medication: Welireg (belzutifan) for the treatment of stage IV clear-cell renal cell carcinoma, planned duration until disease progression or unacceptable toxicity.   Counseled patient and daughter on administration, dosing, side effects, monitoring, drug-food interactions, safe handling, storage, and disposal.  CBC w/ differential and CMP from 12/08/22 assessed. Hgb 7.8 - spoke with provider and plan is to hold off on initiation until Hgb >8. K 2.6, EKG ordered and provider is planning to replace. Prescription dose and frequency assessed for appropriateness.   Patient will take 3 tablets (120 mg total) by mouth once daily.  Start date: pending improvement in Hgb to >8  Side effects include but not limited to: decreased Hgb, fatigue, headache, nausea, hypoxia (trouble breathing). Patient and daughter are aware to call the office if patient experiences shortness of breath.  Reviewed with patient importance of keeping a medication schedule and plan for any missed doses.  Medication reconciliation performed and medication/allergy list updated. Current medication list in Epic reviewed, no clinically relevant DDIs with Welireg identified.  Patient agreement for treatment documented in MD note on 12/08/2022.   After discussion with patient and daughter, no patient barriers to medication adherence identified.   Patient and daughter voiced understanding and appreciation. All questions answered. Medication handout provided.  Provided patient with Oral Chemotherapy Navigation Clinic phone number. Patient knows to call the office with questions or concerns.  Nicole Kindred, PharmD PGY1 Pharmacy Resident 12/08/2022 10:39 AM

## 2022-12-08 NOTE — Telephone Encounter (Signed)
 CRITICAL VALUE STICKER  CRITICAL VALUE: Potassium 2.6  RECEIVER (on-site recipient of call): A , RN  DATE & TIME NOTIFIED: 12/08/22 at 10:29  MESSENGER (representative from lab): Mindi Junker  MD NOTIFIED: Salena Saner Heilingoetter, PA  TIME OF NOTIFICATION: 10:30  RESPONSE:  Acknowledged message

## 2022-12-08 NOTE — Telephone Encounter (Signed)
Oral Oncology Patient Advocate Encounter   Received notification that prior authorization for [name] is required.   PA submitted on 12/08/22 Key [id number] Status is pending    Jinger Neighbors, CPhT-Adv Oncology Pharmacy Patient Advocate [facility] Direct Number: [phone]  Fax: [phone]

## 2022-12-08 NOTE — Progress Notes (Signed)
Pt infusing 1 unit of blood on arrival to floor from ED. Attempted to fill out green transfusion check off sheet when unit finished transfusing. Sheet is not attached to unit of blood or in patients chart. Unable to complete.

## 2022-12-08 NOTE — H&P (Signed)
History and Physical    Patient: [name] ZOX:[id number] DOB: May 19, 1945 DOA: 12/08/2022 DOS: the patient was seen and examined on 12/08/2022 PCP: [name], MD  Patient coming from: Home  Chief Complaint:  Chief Complaint  Patient presents with   Abnormal Lab   HPI: [name] is a 77 y.o. male with medical history significant of osteoarthritis, stage IIIa chronic kidney disease, right nephrectomy, history of renal cell carcinoma, history of lung cancer, no metastasis, history of multiple episodes of pneumonia, CAD, GERD, heart murmur, hyperlipidemia, hypertension, hypothyroidism, incisional hernia, secondary adrenal insufficiency who was sent from the cancer center to the emergency department due to fever, hypokalemia and anemia. He denied rhinorrhea, sore throat, wheezing or hemoptysis. No chest pain, palpitations, diaphoresis, PND, orthopnea, but has pitting edema of the lower extremities.  No abdominal pain, nausea, emesis, diarrhea, constipation, melena or hematochezia.  No flank pain, dysuria, frequency or hematuria.  No polyuria, polydipsia, polyphagia or blurred vision.   Lab work: Urinalysis showed glucose of 50 and protein of 100 mg/dL, but it is otherwise unremarkable.  Lactic acid was 2.4 and then 1.2 mmol/L.  CBC showed white count of 15.4 with 83% neutrophils, hemoglobin 7.8 g/dL with an MCV of 81.1 fL and platelets 520.  CMP showed a 740, potassium 2.6, chloride 93 and CO2 38 mmol/L.  Renal function and calcium were normal.  Total protein 6.7 and albumin 3.0 gr/dL.  AST was 13 and alkaline phosphatase 175 units/L.  Normal alkaline phosphatase and total bilirubin.  Imaging: Portable 1 view chest radiograph with no acute findings.   ED course: Initial vital signs were temperature 98.5 F, pulse 86, respirations 16, BP 173/73 mmHg O2 sat 97% on room air.  The patient received acetaminophen 650 mg p.o. x 1, cefepime 2 g IVPB, KCl 10 mEq IVPB x 3 and vancomycin 1500 mg  IVPB.  Review of Systems: As mentioned in the history of present illness. All other systems reviewed and are negative. Past Medical History:  Diagnosis Date   Arthritis    Chronic kidney disease    only has one kidney    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 02/01/2015   Coronary artery disease    followed by Dr.Tilley   Frequent PVCs    GERD (gastroesophageal reflux disease)    Heart murmur    never has caused any problems   Hx of cancer of lung 1980's   Hyperlipidemia    Hypertension    Hypothyroidism    Incisional hernia 08/01/2015   Lung cancer (HCC) 1993   Lung metastases 2019   Pneumonia    x several   Recurrent umbilical hernia 08/01/2015   Past Surgical History:  Procedure Laterality Date   APPENDECTOMY  age 64   ARTERY BIOPSY Left 04/10/2022   Procedure: LEFT TEMPORAL ARTERY BIOPSY;  Surgeon: Chuck Hint, MD;  Location: [facility] OR;  Service: Vascular;  Laterality: Left;   CHOLECYSTECTOMY  10/18/1978   EYE SURGERY Bilateral 2019   cataract   LAPAROSCOPIC LYSIS OF ADHESIONS N/A 08/01/2015   Procedure: LAPAROSCOPIC LYSIS OF ADHESIONS;  Surgeon: Karie Soda, MD;  Location: [facility] ORS;  Service: General;  Laterality: N/A;   LUNG LOBECTOMY  02/16/1990   lung cancer- patient has staples in lung not to have MRI per patient    MAXILLARY ANTROSTOMY Left 05/22/2022   Procedure: MAXILLARY ANTROSTOMY;  Surgeon: Laren Boom, DO;  Location: [facility] OR;  Service: ENT;  Laterality: Left;  PILONIDAL CYST EXCISION  02/16/2009   Dr Gerrit Friends   ROBOTIC ASSITED PARTIAL NEPHRECTOMY Right 02/01/2015   Procedure: RIGHT ROBOTIC ASSISTED LAPAROSOCOPY NEPHRECTOMY;  Surgeon: Crist Fat, MD;  Location: [facility] ORS;  Service: Urology;  Laterality: Right;   SINUS ENDO WITH FUSION Left 05/22/2022   Procedure: SINUS ENDO WITH FUSION;  Surgeon: Laren Boom, DO;  Location: [facility] OR;  Service: ENT;  Laterality: Left;   VENTRAL HERNIA REPAIR N/A 08/01/2015    Procedure: LAPAROSCOPIC VENTRAL WALL HERNIA WITH MESH;  Surgeon: Karie Soda, MD;  Location: [facility] ORS;  Service: General;  Laterality: N/A;   Social History:  reports that he quit smoking about 34 years ago. His smoking use included cigarettes. He started smoking about 54 years ago. He has a 20 pack-year smoking history. He has never used smokeless tobacco. He reports that he does not currently use alcohol. He reports that he does not use drugs.  Allergies  Allergen Reactions   Diphenhydramine Other (See Comments)    Severe restless legs    Family History  Problem Relation Age of Onset   Heart attack Father     Prior to Admission medications   Medication Sig Start Date End Date Taking? Authorizing Provider  acetaminophen (TYLENOL) 500 MG tablet Take 1,000 mg by mouth 2 (two) times daily.    [provider]  apixaban (ELIQUIS) 2.5 MG TABS tablet Take 1 tablet (2.5 mg total) by mouth 2 (two) times daily. 08/31/22   Croitoru, Mihai, MD  axitinib (INLYTA) 1 MG tablet Take 2 tablets (2 mg total) by mouth 2 (two) times daily. 10/26/22   Si Gaul, MD  belzutifan [facility]) 40 MG tablet Take 3 tablets (120 mg total) by mouth daily. 12/08/22   Si Gaul, MD  diazepam (VALIUM) 2 MG tablet TAKE 1-2 TABLETS BY MOUTH EVERY 8 HOURS AS NEEDED FOR ANXIETY 10/21/22   Pickenpack-Cousar, Arty Baumgartner, NP  diltiazem (CARDIZEM CD) 240 MG 24 hr capsule Take 1 capsule (240 mg total) by mouth in the morning. 03/26/22   Croitoru, Mihai, MD  ezetimibe (ZETIA) 10 MG tablet Take 1 tablet (10 mg total) by mouth daily. Patient taking differently: Take 10 mg by mouth 2 (two) times a week. 03/27/22   Croitoru, Mihai, MD  famotidine (PEPCID) 20 MG tablet Take 20 mg by mouth 2 (two) times daily as needed. 09/15/22   [provider]  fluticasone (FLONASE) 50 MCG/ACT nasal spray Place 2 sprays into both nostrils daily as needed for allergies. 01/20/22   [provider]  hydrocortisone (CORTEF) 5 MG  tablet Take by mouth 15 mg in am and 5 mg in pm 08/03/22   Carlus Pavlov, MD  levothyroxine (SYNTHROID) 150 MCG tablet Take 1 tablet (150 mcg total) by mouth daily before breakfast. 08/03/22   Carlus Pavlov, MD  loratadine (CLARITIN) 10 MG tablet Take 10 mg by mouth daily as needed for allergies.    [provider]  megestrol (MEGACE) 20 MG tablet TAKE 1 TABLET BY MOUTH DAILY 10/21/22   Heilingoetter, Cassandra L, PA-C  Multiple Vitamins-Minerals (CENTRUM SILVER 50+MEN) TABS Take 1 tablet by mouth daily with breakfast.    [provider]  mupirocin ointment (BACTROBAN) 2 % Place 1 Application into the nose daily as needed (sores).    [provider]  OLANZapine (ZYPREXA) 5 MG tablet Take 1 tablet (5 mg total) by mouth at bedtime. 06/28/22 09/26/22  [place], Alvira Philips, DO  posaconazole (NOXAFIL) 100 MG TBEC delayed-release tablet Take 3  tablets (300 mg total) by mouth daily. 10/22/22   Danelle Earthly, MD  potassium chloride 20 MEQ/15ML (10%) SOLN Take 30 mLs (40 mEq total) by mouth daily for 10 days. 12/03/22 12/13/22  Si Gaul, MD  traZODone (DESYREL) 150 MG tablet Take 150 mg by mouth at bedtime as needed for sleep.    [provider]  VITAMIN D PO Take 1 capsule by mouth daily.    [provider]    Physical Exam: Vitals:   12/08/22 1230 12/08/22 1245 12/08/22 1330 12/08/22 1345  BP: (!) 147/75 (!) 150/71 (!) 155/73 (!) 154/79  Pulse: 85 88 88 88  Resp: (!) 21 (!) 24 (!) 27 (!) 26  Temp:      TempSrc:      SpO2: 94% 97% 93% 94%  Weight:      Height:       Physical Exam Vitals and nursing note reviewed.  Constitutional:      General: He is awake. He is not in acute distress.    Appearance: He is normal weight. He is ill-appearing. He is not toxic-appearing or diaphoretic.  HENT:     Head: Normocephalic.     Nose: No rhinorrhea.     Mouth/Throat:     Mouth: Mucous membranes are dry.  Eyes:     General: No scleral icterus.     Pupils: Pupils are equal, round, and reactive to light.  Neck:     Vascular: No JVD.  Cardiovascular:     Rate and Rhythm: Normal rate and regular rhythm.     Heart sounds: S1 normal and S2 normal.  Pulmonary:     Effort: Tachypnea present. No accessory muscle usage.     Breath sounds: No decreased breath sounds, wheezing, rhonchi or rales.  Abdominal:     General: Bowel sounds are normal.     Palpations: Abdomen is soft.     Tenderness: There is no abdominal tenderness. There is no right CVA tenderness, left CVA tenderness, guarding or rebound.  Musculoskeletal:     Cervical back: Neck supple.     Right lower leg: No edema.     Left lower leg: No edema.  Skin:    General: Skin is warm and dry.  Neurological:     General: No focal deficit present.     Mental Status: He is alert and oriented to person, place, and time.  Psychiatric:        Mood and Affect: Mood normal.        Behavior: Behavior normal. Behavior is cooperative.     Data Reviewed:  Results are pending, will review when available.  Assessment and Plan: Principal Problem:   Sepsis due to undetermined organism (HCC) Admit to MedSurg/inpatient. Continue IV fluids. Continue cefepime 2 g every 8 hours.   Continue metronidazole 500 mg IVPB q 12 hr. Continue vancomycin per pharmacy. Follow-up blood culture and sensitivity Follow CBC and CMP in a.m.  Active Problems:   GERD (gastroesophageal reflux disease) Antiacid, H2 blocker or PPI as needed.    Coronary artery disease On apixaban.  Off Zetia. Follow-up with cardiology as an outpatient.    Paroxysmal atrial fibrillation Continue diltiazem 240 mg p.o. daily. Continue apixaban 2.5 mg p.o. twice daily.    Hyperlipidemia Off ezetimibe. Follow-up with PCP.    Essential hypertension Continue Cardizem 240 mg p.o. daily.    Acquired hypothyroidism Continue levothyroxine 150 mcg p.o. daily.    Anemia of chronic disease Transfuse 1 unit in  ED. Follow-up hematocrit and hemoglobin.    Fungal Sinus Infection  Continue posaconazole 300 mg p.o. daily.    Advance Care Planning:   Code Status: Full Code   Consults:   Family Communication: His spouse was at bedside.  Severity of Illness: The appropriate patient status for this patient is OBSERVATION. Observation status is judged to be reasonable and necessary in order to provide the required intensity of service to ensure the patient's safety. The patient's presenting symptoms, physical exam findings, and initial radiographic and laboratory data in the context of their medical condition is felt to place them at decreased risk for further clinical deterioration. Furthermore, it is anticipated that the patient will be medically stable for discharge from the hospital within 2 midnights of admission.   Author: Bobette Mo, MD 12/08/2022 2:09 PM  For on call review [email or web address].   This document was prepared using Dragon voice recognition software and may contain some unintended transcription errors.

## 2022-12-08 NOTE — Progress Notes (Signed)
DISCONTINUE ON PATHWAY REGIMEN - Renal Cell     A cycle is every 21 days:     Nivolumab      Ipilimumab    A cycle is every 14 days:     Nivolumab   **Always confirm dose/schedule in your pharmacy ordering system**  REASON: Disease Progression PRIOR TREATMENT: RCOS42: Nivolumab 3 mg/kg + Ipilimumab 1 mg/kg q21 Days x 4 Cycles, Followed by Nivolumab 240 mg q14 Days TREATMENT RESPONSE: Progressive Disease (PD)  START ON PATHWAY REGIMEN - Renal Cell     A cycle is every 28 days:     Belzutifan   **Always confirm dose/schedule in your pharmacy ordering system**  Patient Characteristics: Stage IV (Unresected T4M0 or Any T, M1)/Metastatic Disease, Clear Cell, Third Line and Beyond, Prior Checkpoint Inhibitor and VEGFR-TKI Therapy Therapeutic Status: Stage IV (Unresected T4M0 or Any T, M1)/Metastatic Disease Histology: Clear Cell Line of Therapy: Third Line and Beyond Intent of Therapy: Non-Curative / Palliative Intent, Discussed with Patient

## 2022-12-08 NOTE — ED Provider Notes (Signed)
[place] EMERGENCY DEPARTMENT AT [facility] Provider Note   CSN: [id number] Arrival date & time: 12/08/22  1131     History  Chief Complaint  Patient presents with   Abnormal Lab    [name] is a 77 y.o. male.  77 year old male from cancer center due to increased weakness as well as low-grade temperature and hypokalemia.  Patient has had a history of hyperkalemia for quite some time.  He is on oral day potassium.  Potassium today in the clinic was found be 2.6.  Patient also noted to have a hemoglobin below 7.  He is also noted decreased oral intake and just generalized malaise.  He does have history of renal cell cancer and is currently getting chemotherapy.  Slight cough is been appreciated.  He also notes increased fluid retention as well to.       Home Medications Prior to Admission medications   Medication Sig Start Date End Date Taking? Authorizing Provider  acetaminophen (TYLENOL) 500 MG tablet Take 1,000 mg by mouth 2 (two) times daily.    [provider]  apixaban (ELIQUIS) 2.5 MG TABS tablet Take 1 tablet (2.5 mg total) by mouth 2 (two) times daily. 08/31/22   Croitoru, Mihai, MD  axitinib (INLYTA) 1 MG tablet Take 2 tablets (2 mg total) by mouth 2 (two) times daily. 10/26/22   Si Gaul, MD  belzutifan [facility]) 40 MG tablet Take 3 tablets (120 mg total) by mouth daily. 12/08/22   Si Gaul, MD  diazepam (VALIUM) 2 MG tablet TAKE 1-2 TABLETS BY MOUTH EVERY 8 HOURS AS NEEDED FOR ANXIETY 10/21/22   Pickenpack-Cousar, Arty Baumgartner, NP  diltiazem (CARDIZEM CD) 240 MG 24 hr capsule Take 1 capsule (240 mg total) by mouth in the morning. 03/26/22   Croitoru, Mihai, MD  ezetimibe (ZETIA) 10 MG tablet Take 1 tablet (10 mg total) by mouth daily. Patient taking differently: Take 10 mg by mouth 2 (two) times a week. 03/27/22   Croitoru, Mihai, MD  famotidine (PEPCID) 20 MG tablet Take 20 mg by mouth 2 (two) times daily as needed. 09/15/22   [provider]  fluticasone (FLONASE) 50 MCG/ACT nasal spray Place 2 sprays into both nostrils daily as needed for allergies. 01/20/22   [provider]  hydrocortisone (CORTEF) 5 MG tablet Take by mouth 15 mg in am and 5 mg in pm 08/03/22   Carlus Pavlov, MD  levothyroxine (SYNTHROID) 150 MCG tablet Take 1 tablet (150 mcg total) by mouth daily before breakfast. 08/03/22   Carlus Pavlov, MD  loratadine (CLARITIN) 10 MG tablet Take 10 mg by mouth daily as needed for allergies.    [provider]  megestrol (MEGACE) 20 MG tablet TAKE 1 TABLET BY MOUTH DAILY 10/21/22   Heilingoetter, Cassandra L, PA-C  Multiple Vitamins-Minerals (CENTRUM SILVER 50+MEN) TABS Take 1 tablet by mouth daily with breakfast.    [provider]  mupirocin ointment (BACTROBAN) 2 % Place 1 Application into the nose daily as needed (sores).    [provider]  OLANZapine (ZYPREXA) 5 MG tablet Take 1 tablet (5 mg total) by mouth at bedtime. 06/28/22 09/26/22  [place], Alvira Philips, DO  posaconazole (NOXAFIL) 100 MG TBEC delayed-release tablet Take 3 tablets (300 mg total) by mouth daily. 10/22/22   Danelle Earthly, MD  potassium chloride 20 MEQ/15ML (10%) SOLN Take 30 mLs (40 mEq total) by mouth daily for 10 days. 12/03/22 12/13/22  Si Gaul, MD  traZODone (DESYREL) 150 MG tablet  Take 150 mg by mouth at bedtime as needed for sleep.    [provider]  VITAMIN D PO Take 1 capsule by mouth daily.    [provider]      Allergies    Diphenhydramine    Review of Systems   Review of Systems  All other systems reviewed and are negative.   Physical Exam Updated Vital Signs BP (!) 173/73   Pulse 86   Temp 98.5 F (36.9 C) (Oral)   Resp 16   Ht 1.727 m (5\' 8" )   Wt 73 kg   SpO2 97%   BMI 24.48 kg/m  Physical Exam Vitals and nursing note reviewed.  Constitutional:      General: He is not in acute distress.    Appearance: Normal appearance. He is well-developed. He  is not toxic-appearing.  HENT:     Head: Normocephalic and atraumatic.  Eyes:     General: Lids are normal.     Conjunctiva/sclera: Conjunctivae normal.     Pupils: Pupils are equal, round, and reactive to light.  Neck:     Thyroid: No thyroid mass.     Trachea: No tracheal deviation.  Cardiovascular:     Rate and Rhythm: Normal rate and regular rhythm.     Heart sounds: Normal heart sounds. No murmur heard.    No gallop.  Pulmonary:     Effort: Pulmonary effort is normal. No respiratory distress.     Breath sounds: Normal breath sounds. No stridor. No decreased breath sounds, wheezing, rhonchi or rales.  Abdominal:     General: There is no distension.     Palpations: Abdomen is soft.     Tenderness: There is no abdominal tenderness. There is no rebound.  Musculoskeletal:        General: No tenderness. Normal range of motion.     Cervical back: Normal range of motion and neck supple.  Lymphadenopathy:     Comments: Plus bilateral extremity pitting edema  Skin:    General: Skin is warm and dry.     Findings: No abrasion or rash.  Neurological:     Mental Status: He is alert and oriented to person, place, and time. Mental status is at baseline.     GCS: GCS eye subscore is 4. GCS verbal subscore is 5. GCS motor subscore is 6.     Cranial Nerves: Cranial nerves are intact. No cranial nerve deficit.     Sensory: No sensory deficit.     Motor: Motor function is intact.  Psychiatric:        Attention and Perception: Attention normal.        Mood and Affect: Affect is flat.        Behavior: Behavior is withdrawn.     ED Results / Procedures / Treatments   Labs (all labs ordered are listed, but only abnormal results are displayed) Labs Reviewed  CULTURE, BLOOD (ROUTINE X 2)  CULTURE, BLOOD (ROUTINE X 2)  SARS CORONAVIRUS 2 BY RT PCR  URINALYSIS, W/ REFLEX TO CULTURE (INFECTION SUSPECTED)  I-STAT CG4 LACTIC ACID, ED    EKG None  Radiology No results  found.  Procedures Procedures    Medications Ordered in ED Medications  lactated ringers infusion (has no administration in time range)    ED Course/ Medical Decision Making/ A&P  Medical Decision Making Amount and/or Complexity of Data Reviewed Labs: ordered. Radiology: ordered.  Risk Prescription drug management.   Patient presented with temperature 100.5 cancer center.  Patient currently undergoing chemotherapy.  He also notes diffuse weakness.  Concern for possible infectious etiology.  Lactate elevated 2.4.  Patient is also experiencing increasing edema.  His chest x-ray per interpretation and review shows right-sided pleural effusion which a little worse.  Concern for possible fluid overload and therefore will not bolus patient with full sepsis protocol.  Patient was however started on IV antibiotics.  Patient has known history of hypokalemia.  Started on IV potassium supplementation.  Also noted to be anemic in the cancer center and patient given 1 unit packed red blood cells here.  Will consult hospitalist for admission at bedside and informed  CRITICAL CARE Performed by: Toy Baker Total critical care time: 50 minutes Critical care time was exclusive of separately billable procedures and treating other patients. Critical care was necessary to treat or prevent imminent or life-threatening deterioration. Critical care was time spent personally by me on the following activities: development of treatment plan with patient and/or surrogate as well as nursing, discussions with consultants, evaluation of patient's response to treatment, examination of patient, obtaining history from patient or surrogate, ordering and performing treatments and interventions, ordering and review of laboratory studies, ordering and review of radiographic studies, pulse oximetry and re-evaluation of patient's condition.          Final Clinical Impression(s) /  ED Diagnoses Final diagnoses:  None    Rx / DC Orders ED Discharge Orders     None         Lorre Nick, MD 12/08/22 1345

## 2022-12-09 ENCOUNTER — Inpatient Hospital Stay (HOSPITAL_COMMUNITY): Payer: Medicare HMO

## 2022-12-09 DIAGNOSIS — J320 Chronic maxillary sinusitis: Secondary | ICD-10-CM | POA: Diagnosis not present

## 2022-12-09 DIAGNOSIS — E876 Hypokalemia: Principal | ICD-10-CM

## 2022-12-09 DIAGNOSIS — R6510 Systemic inflammatory response syndrome (SIRS) of non-infectious origin without acute organ dysfunction: Secondary | ICD-10-CM | POA: Diagnosis not present

## 2022-12-09 DIAGNOSIS — A419 Sepsis, unspecified organism: Secondary | ICD-10-CM | POA: Diagnosis not present

## 2022-12-09 LAB — RESPIRATORY PANEL BY PCR

## 2022-12-09 LAB — COMPREHENSIVE METABOLIC PANEL
ALT: 13 U/L (ref 0–44)
AST: 12 U/L — ABNORMAL LOW (ref 15–41)
Albumin: 2 g/dL — ABNORMAL LOW (ref 3.5–5.0)
Alkaline Phosphatase: 136 U/L — ABNORMAL HIGH (ref 38–126)
Anion gap: 12 (ref 5–15)
BUN: 10 mg/dL (ref 8–23)
CO2: 32 mmol/L (ref 22–32)
Calcium: 8.1 mg/dL — ABNORMAL LOW (ref 8.9–10.3)
Chloride: 92 mmol/L — ABNORMAL LOW (ref 98–111)
Creatinine, Ser: 0.82 mg/dL (ref 0.61–1.24)
GFR, Estimated: >60 mL/min (ref >60)
Glucose, Bld: 132 mg/dL — ABNORMAL HIGH (ref 70–99)
Potassium: 3.7 mmol/L (ref 3.5–5.1)
Sodium: 136 mmol/L (ref 135–145)
Total Bilirubin: 0.8 mg/dL (ref 0.3–1.2)
Total Protein: 5.6 g/dL — ABNORMAL LOW (ref 6.5–8.1)

## 2022-12-09 LAB — BPAM RBC
Blood Product Expiration Date: 202411172359
ISSUE DATE / TIME: 202410221604
Unit Type and Rh: 7300

## 2022-12-09 LAB — TYPE AND SCREEN
ABO/RH(D): B POS
Antibody Screen: NEGATIVE
Unit division: 0

## 2022-12-09 LAB — CBC WITH DIFFERENTIAL/PLATELET
Abs Immature Granulocytes: 0.13 10*3/uL — ABNORMAL HIGH (ref 0.00–0.07)
Basophils Absolute: 0 10*3/uL (ref 0.0–0.1)
Basophils Relative: 0 %
Eosinophils Absolute: 0.2 10*3/uL (ref 0.0–0.5)
Eosinophils Relative: 2 %
HCT: 29.3 % — ABNORMAL LOW (ref 39.0–52.0)
Hemoglobin: 8.4 g/dL — ABNORMAL LOW (ref 13.0–17.0)
Immature Granulocytes: 1 %
Lymphocytes Relative: 7 %
Lymphs Abs: 0.9 10*3/uL (ref 0.7–4.0)
MCH: 23.1 pg — ABNORMAL LOW (ref 26.0–34.0)
MCHC: 28.7 g/dL — ABNORMAL LOW (ref 30.0–36.0)
MCV: 80.5 fL (ref 80.0–100.0)
Monocytes Absolute: 1.2 10*3/uL — ABNORMAL HIGH (ref 0.1–1.0)
Monocytes Relative: 9 %
Neutro Abs: 10.4 10*3/uL — ABNORMAL HIGH (ref 1.7–7.7)
Neutrophils Relative %: 81 %
Platelets: 404 10*3/uL — ABNORMAL HIGH (ref 150–400)
RBC: 3.64 MIL/uL — ABNORMAL LOW (ref 4.22–5.81)
RDW: 19.4 % — ABNORMAL HIGH (ref 11.5–15.5)
WBC: 12.9 10*3/uL — ABNORMAL HIGH (ref 4.0–10.5)
nRBC: 0 % (ref 0.0–0.2)

## 2022-12-09 MED ORDER — ENSURE ENLIVE PO LIQD
237.0000 mL | Freq: Three times a day (TID) | ORAL | Status: DC
  Administered 2022-12-09 – 2022-12-10 (×2): 237 mL via ORAL

## 2022-12-09 MED ORDER — IOHEXOL 300 MG/ML  SOLN
75.0000 mL | Freq: Once | INTRAMUSCULAR | Status: AC | PRN
  Administered 2022-12-09: 75 mL via INTRAVENOUS

## 2022-12-09 MED ORDER — MAGNESIUM SULFATE 2 GM/50ML IV SOLN
2.0000 g | Freq: Once | INTRAVENOUS | Status: AC
  Administered 2022-12-09: 2 g via INTRAVENOUS
  Filled 2022-12-09: qty 50

## 2022-12-09 MED ORDER — SODIUM CHLORIDE (PF) 0.9 % IJ SOLN
INTRAMUSCULAR | Status: AC
Start: 2022-12-09 — End: ?
  Filled 2022-12-09: qty 50

## 2022-12-09 NOTE — Assessment & Plan Note (Addendum)
-   Left maxillary sinusitis noted with Aspergillus from 05/22/2022 - follows with ID; last seen 10/12/22; plan was for repeat imaging beginning of Nov which would mark 6 mths of treatment - ID consulted while inpatient for further guidance esp in setting of potential infectious workup on admission - repeat CT maxillofacial performed 10/23: shows improvement - continue posaconazole; final course length per ID; outpatient followup with Dr. Thedore Mins on 11/11

## 2022-12-09 NOTE — Assessment & Plan Note (Signed)
Continue Pepcid  

## 2022-12-09 NOTE — Assessment & Plan Note (Signed)
-   Off zetia

## 2022-12-09 NOTE — Assessment & Plan Note (Addendum)
-   Afebrile and nontoxic-appearing on admission.  He presented to the hospital mainly for electrolyte replacement and PRBC if needed -Lab workup notable for leukocytosis and lactic acidosis.  He has not had any reported diarrhea but oral intake has been poor recently and he is more than likely volume depleted; suspect lactic acid elevated due to this rather than acute infection - encourage diet -Appreciate ID evaluation.  Antibiotics de-escalated and SIRS criteria considered due to underlying malignancy - No further antibiotics at discharge

## 2022-12-09 NOTE — Progress Notes (Signed)
Progress Note    [name]   DGU:[id number]  DOB: [birth year]  DOA: 12/08/2022     1 PCP: [name], MD  Initial CC: hypokalemia, low Hgb  Hospital Course: [name] is a 77 yo male with PMH Stage IV RCC s/p right nephrectomy (mets to liver and omentum 2019).  He is followed closely by oncology and has been on multiple treatment regimens since diagnosis.  There has been some interaction with his regimen with posaconazole.  He has also had difficulty with hypokalemia and worsening anemia.  He elected to present to the ER for further repletion of electrolyte abnormalities. 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  He has recently been maintained on posaconazole. On workup he was found to have a leukocytosis and elevated lactic acid.  There was concern for possible sepsis and he was started on antibiotics on admission.  Interval History:  No events overnight.  Resting in bed with daughter present bedside this morning.  Endorses poor oral intake recently.  Denies fevers, chills.  No reported recent diarrhea.  Denies nausea or vomiting.  Assessment and Plan: * SIRS (systemic inflammatory response syndrome) (HCC) - Afebrile and nontoxic-appearing on admission.  He presented to the hospital mainly for electrolyte replacement and PRBC if needed -Lab workup notable for leukocytosis and lactic acidosis.  He has not had any reported diarrhea but oral intake has been poor recently and he is more than likely volume depleted; suspect lactic acid elevated due to this rather than acute infection - encourage diet; start ensure - may need further IVF; was actually given lasix on admission - ID also following in setting of sinusitis; appreciate rec's  Hypokalemia - replete as needed  Maxillary sinusitis - Left maxillary sinusitis noted with Aspergillus from 05/22/2022 - follows with ID; last seen 10/12/22; plan was for repeat imaging  beginning of Nov which would mark 6 mths of treatment - ID consulted while inpatient for further guidance esp in setting of potential infectious workup on admission - follow up repeat CT maxillofacial - continue posaconazole  Paroxysmal atrial fibrillation (HCC) - Continue Cardizem and Eliquis  Anemia of chronic disease - Goal hemoglobin greater than 8 g/dL per oncology recommendations - Hemoglobin 7.8 g/dL on admission - Received 1 unit PRBC - Continue trending H/H  Acquired hypothyroidism - Continue Synthroid  Essential hypertension - Continue Cardizem  Hyperlipidemia - Off zetia   Coronary artery disease On apixaban.  Off Zetia. Follow-up with cardiology as an outpatient.  GERD (gastroesophageal reflux disease) - Continue Pepcid   Old records reviewed in assessment of this patient  Antimicrobials: Flagyl 12/08/2022 >> 12/09/2022 Vancomycin 12/08/2022 >> current Cefepime 12/08/2022 >> current  DVT prophylaxis:  apixaban (ELIQUIS) tablet 2.5 mg Start: 12/08/22 2200 SCDs Start: 12/08/22 1447 apixaban (ELIQUIS) tablet 2.5 mg   Code Status:   Code Status: Full Code  Mobility Assessment (Last 72 Hours)     Mobility Assessment     Row Name 12/09/22 1055 12/08/22 1908 12/08/22 1700       Does patient have an order for bedrest or is patient medically unstable No - Continue assessment No - Continue assessment No - Continue assessment     What is the highest level of mobility based on the progressive mobility assessment? Level 5 (Walks with assist in room/hall) - Balance while stepping forward/back and can walk in room with assist - Complete Level 5 (Walks with assist in room/hall) -  Balance while stepping forward/back and can walk in room with assist - Complete Level 5 (Walks with assist in room/hall) - Balance while stepping forward/back and can walk in room with assist - Complete              Barriers to discharge: none Disposition Plan:  Home Status is:  Inpt  Objective: Blood pressure 137/65, pulse 74, temperature 97.9 F (36.6 C), temperature source Oral, resp. rate 17, height 5\' 8"  (1.727 m), weight 73 kg, SpO2 95%.  Examination:  Physical Exam Constitutional:      General: He is not in acute distress.    Appearance: Normal appearance.  HENT:     Head: Normocephalic and atraumatic.     Mouth/Throat:     Mouth: Mucous membranes are moist.  Eyes:     Extraocular Movements: Extraocular movements intact.  Cardiovascular:     Rate and Rhythm: Normal rate and regular rhythm.  Pulmonary:     Effort: Pulmonary effort is normal. No respiratory distress.     Breath sounds: Normal breath sounds. No wheezing.  Abdominal:     General: Bowel sounds are normal. There is no distension.     Palpations: Abdomen is soft.     Tenderness: There is no abdominal tenderness.  Musculoskeletal:        General: Normal range of motion.     Cervical back: Normal range of motion and neck supple.     Right lower leg: Edema (1-2+ pitting) present.     Left lower leg: Edema (1-2+ pitting) present.  Skin:    General: Skin is warm and dry.  Neurological:     General: No focal deficit present.     Mental Status: He is alert.  Psychiatric:        Mood and Affect: Mood normal.        Behavior: Behavior normal.      Consultants:  ID  Procedures:    Data Reviewed: Results for orders placed or performed during the hospital encounter of 12/08/22 (from the past 24 hour(s))  Basic metabolic panel     Status: Abnormal   Collection Time: 12/08/22  9:34 PM  Result Value Ref Range   Sodium 137 135 - 145 mmol/L   Potassium 2.6 (LL) 3.5 - 5.1 mmol/L   Chloride 93 (L) 98 - 111 mmol/L   CO2 32 22 - 32 mmol/L   Glucose, Bld 153 (H) 70 - 99 mg/dL   BUN 10 8 - 23 mg/dL   Creatinine, Ser 1.61 0.61 - 1.24 mg/dL   Calcium 8.0 (L) 8.9 - 10.3 mg/dL   GFR, Estimated >09 >60 mL/min   Anion gap 12 5 - 15  CBC     Status: Abnormal   Collection Time: 12/08/22  9:34  PM  Result Value Ref Range   WBC 12.0 (H) 4.0 - 10.5 K/uL   RBC 3.69 (L) 4.22 - 5.81 MIL/uL   Hemoglobin 8.5 (L) 13.0 - 17.0 g/dL   HCT 45.4 (L) 09.8 - 11.9 %   MCV 81.3 80.0 - 100.0 fL   MCH 23.0 (L) 26.0 - 34.0 pg   MCHC 28.3 (L) 30.0 - 36.0 g/dL   RDW 14.7 (H) 82.9 - 56.2 %   Platelets 411 (H) 150 - 400 K/uL   nRBC 0.0 0.0 - 0.2 %  Magnesium     Status: None   Collection Time: 12/08/22  9:34 PM  Result Value Ref Range   Magnesium 1.7 1.7 - 2.4  mg/dL  Phosphorus     Status: None   Collection Time: 12/08/22  9:34 PM  Result Value Ref Range   Phosphorus 2.5 2.5 - 4.6 mg/dL  CBC with Differential     Status: Abnormal   Collection Time: 12/09/22  5:01 AM  Result Value Ref Range   WBC 12.9 (H) 4.0 - 10.5 K/uL   RBC 3.64 (L) 4.22 - 5.81 MIL/uL   Hemoglobin 8.4 (L) 13.0 - 17.0 g/dL   HCT 56.3 (L) 87.5 - 64.3 %   MCV 80.5 80.0 - 100.0 fL   MCH 23.1 (L) 26.0 - 34.0 pg   MCHC 28.7 (L) 30.0 - 36.0 g/dL   RDW 32.9 (H) 51.8 - 84.1 %   Platelets 404 (H) 150 - 400 K/uL   nRBC 0.0 0.0 - 0.2 %   Neutrophils Relative % 81 %   Neutro Abs 10.4 (H) 1.7 - 7.7 K/uL   Lymphocytes Relative 7 %   Lymphs Abs 0.9 0.7 - 4.0 K/uL   Monocytes Relative 9 %   Monocytes Absolute 1.2 (H) 0.1 - 1.0 K/uL   Eosinophils Relative 2 %   Eosinophils Absolute 0.2 0.0 - 0.5 K/uL   Basophils Relative 0 %   Basophils Absolute 0.0 0.0 - 0.1 K/uL   Immature Granulocytes 1 %   Abs Immature Granulocytes 0.13 (H) 0.00 - 0.07 K/uL  Comprehensive metabolic panel     Status: Abnormal   Collection Time: 12/09/22  5:01 AM  Result Value Ref Range   Sodium 136 135 - 145 mmol/L   Potassium 3.7 3.5 - 5.1 mmol/L   Chloride 92 (L) 98 - 111 mmol/L   CO2 32 22 - 32 mmol/L   Glucose, Bld 132 (H) 70 - 99 mg/dL   BUN 10 8 - 23 mg/dL   Creatinine, Ser 6.60 0.61 - 1.24 mg/dL   Calcium 8.1 (L) 8.9 - 10.3 mg/dL   Total Protein 5.6 (L) 6.5 - 8.1 g/dL   Albumin 2.0 (L) 3.5 - 5.0 g/dL   AST 12 (L) 15 - 41 U/L   ALT 13 0 - 44 U/L    Alkaline Phosphatase 136 (H) 38 - 126 U/L   Total Bilirubin 0.8 0.3 - 1.2 mg/dL   GFR, Estimated >63 >01 mL/min   Anion gap 12 5 - 15    I have reviewed pertinent nursing notes, vitals, labs, and images as necessary. I have ordered labwork to follow up on as indicated.  I have reviewed the last notes from staff over past 24 hours. I have discussed patient's care plan and test results with nursing staff, CM/SW, and other staff as appropriate.  Time spent: Greater than 50% of the 55 minute visit was spent in counseling/coordination of care for the patient as laid out in the A&P.   LOS: 1 day   Lewie Chamber, MD [facility] 12/09/2022, 4:10 PM

## 2022-12-09 NOTE — Assessment & Plan Note (Signed)
Continue Cardizem. 

## 2022-12-09 NOTE — Progress Notes (Signed)
   12/09/22 1103  TOC Brief Assessment  Insurance and Status Reviewed  Patient has primary care physician Yes  Home environment has been reviewed home alone  Prior level of function: independent overall  Prior/Current Home Services No current home services  Social Determinants of Health Reivew SDOH reviewed no interventions necessary  Readmission risk has been reviewed Yes  Transition of care needs no transition of care needs at this time

## 2022-12-09 NOTE — Assessment & Plan Note (Addendum)
-   Goal hemoglobin greater than 8 g/dL per oncology recommendations - Hemoglobin 7.8 g/dL on admission - Received 1 unit PRBC -Hemoglobin remained stable, 9.2 g/dL at discharge

## 2022-12-09 NOTE — Assessment & Plan Note (Addendum)
Continue Synthroid °

## 2022-12-09 NOTE — Hospital Course (Addendum)
[name] is a 77 yo male with PMH Stage IV RCC s/p right nephrectomy (mets to liver and omentum 2019).  He is followed closely by oncology and has been on multiple treatment regimens since diagnosis.  There has been some interaction with his regimen with posaconazole.  He has also had difficulty with hypokalemia and worsening anemia.  He elected to present to the ER for further repletion of electrolyte abnormalities. 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  He has recently been maintained on posaconazole. On workup he was found to have a leukocytosis and elevated lactic acid.  There was concern for possible sepsis and he was started on antibiotics on admission.  Due to underlying malignancy contributing to possible SIRS/sepsis picture, ID was consulted.  Blood cultures remained negative.  CT maxillofacial was obtained which showed improvement in underlying fungal sinusitis.  Antibiotics were slowly de-escalated during hospitalization and he was not continued on any further antibiotics except for posaconazole.  He will follow-up outpatient with ID as previously scheduled to discuss final duration length of antifungal coverage.

## 2022-12-09 NOTE — Plan of Care (Signed)

## 2022-12-09 NOTE — Assessment & Plan Note (Addendum)
repleted ?

## 2022-12-09 NOTE — Progress Notes (Signed)
Mobility Specialist - Progress Note   12/09/22 0937  Mobility  Activity Ambulated with assistance in hallway  Level of Assistance Modified independent, requires aide device or extra time  Assistive Device Other (Comment) (IV Pole)  Distance Ambulated (ft) 250 ft  Activity Response Tolerated well  Mobility Referral Yes  $Mobility charge 1 Mobility  Mobility Specialist Start Time (ACUTE ONLY) [id number]  Mobility Specialist Stop Time (ACUTE ONLY) 0936  Mobility Specialist Time Calculation (min) (ACUTE ONLY) 7 min   Pt received in bed and agreeable to mobility. No complaints during session. Pt to EOB after session with all needs met.     [facility]

## 2022-12-09 NOTE — Consult Note (Signed)
[facility]    Date of Admission:  12/08/2022     Reason for Consult: fever    Referring Provider: Girguis       Abx: 10/22-c vanc 10/22-c cefepime Outpatient posaconazole continued  10/22-23 flagyl        Assessment: 77 yo male ckd3, rcc with recurrence/metastasis (omental biopsy 08/2017), hx right nephrectomy due to rcc, hx lung cancer, cad, gerd, hypothyroidism and 2nd adrenal insufficiency, sent from cancer center 10/22 due to fever  Fever -- no clear source Aspergillus rhinosinusitis Recurrent rcc stage 4-- astininib first dose 07/29/22; d/c'ed 11/12/22 due to disease progression; recent imaging concerning for hepatic metastasis and increasing abdominal adenopathy, and a large mass in region of uncinate process of the pancrea that is enlarging as well  Hx panhypopituitarism and AI due to immune check point inhibitor nivolumab and ipilimumab and patient received astininib since 07/29/22 but stopped 11/12/22 due to disease progression as mention  Planned outpatient [facility] 2nd opinion   He has had failure to thrive picture the past few months. Sinus pain better since surgery  10/22 bcx negative 05/22/22 left maxillary sinus bacterial culture -- mold --> reflex id'ed aspergillus fumigatus. Of note the fungal ball/mass left there     I suspect this is not due to aspergillus related fever In setting of progression of RCC burden I suspect cancer is due to that No other localizing sx to suggest other infectious syndrome. Will get respiratory viral pcr to r/o viral infection Bcx is cooking and can deescalate abx if returns negative   I reviewed dr Doristine Church plan for patient's posaconazole. Will repeat ct scan today. If stable, I think it's reasonable to stop posaconazole and see how he does. Saprophytic fungal ball can't be treated with antifungal. The posaconazole was for erosion into the sinus wall.    Plan: Stop flagyl Continue  vanc/cefepime Continue posaconazole Respiratory viral pcr Follow up blood cx and deescalate abx if no growth Sinus ct Will copy chart to dr Thedore Mins Discussed with primary team      ------------------------------------------------ Principal Problem:   Sepsis due to undetermined organism [facility]) Active Problems:   GERD (gastroesophageal reflux disease)   Coronary artery disease   Hyperlipidemia   Essential hypertension   Acquired hypothyroidism   Anemia of chronic disease   Paroxysmal atrial fibrillation (HCC)    HPI: [name] is a 77 y.o. male ckd3, rcc with recurrence/metastasis (omental biopsy 08/2017), hx right nephrectomy due to rcc, hx lung cancer, cad, gerd, hypothyroidism and 2nd adrenal insufficiency, sent from cancer center 10/22 due to fever  Spoke with patient's daughter and review chart. Patient heading down to sinus ct  Patient recent dx'ed progressive left  maxillary sinus wall erosion and had endoscopic confirmed aspergillus. There was a mass ?fungal ball. Started on posaconazole 06/2021. Serial ct last done 3 months ago showed stable disease of sinus  No further pain in sinus  Daughter reports malaise/weakness/poor appetite and slow progressive course since the dx'ed of fungal infection and starting of posaconazole  With regard to chemo/cancer, he had adverse reaction to checkpoint inhibitors and chemo was switched to astininib 07/2022 but disease progression continues so this was stopped. Dr Arbutus Ped is currently working with [facility] oncology for next salvage chemo. He last received astininib 11/12/22?  On presentation Febrile Wbc 13; hb 8's stable; plt 400s stable Potassium 2.6 Cr 0.9 Lft normal Alb 3.0 --> 2.0 with fluid hydration Chest xray  no acute finding   Family History  Problem Relation Age of Onset   Heart attack Father     Social History   Tobacco Use   Smoking status: Former    Current packs/day: 0.00    Average packs/day: 1 pack/day for 20.0  years (20.0 ttl pk-yrs)    Types: Cigarettes    Start date: 01/29/1968    Quit date: 01/29/1988    Years since quitting: 34.8   Smokeless tobacco: Never  Vaping Use   Vaping status: Never Used  Substance Use Topics   Alcohol use: Not Currently    Comment: rare   Drug use: No    Allergies  Allergen Reactions   Diphenhydramine Other (See Comments)    Severe restless legs    Review of Systems: ROS All Other ROS was negative, except mentioned above   Past Medical History:  Diagnosis Date   Arthritis    Chronic kidney disease    only has one kidney    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 02/01/2015   Coronary artery disease    followed by Dr.Tilley   Frequent PVCs    GERD (gastroesophageal reflux disease)    Heart murmur    never has caused any problems   Hx of cancer of lung 1980's   Hyperlipidemia    Hypertension    Hypothyroidism    Incisional hernia 08/01/2015   Lung cancer (HCC) 1993   Lung metastases 2019   Pneumonia    x several   Recurrent umbilical hernia 08/01/2015       Scheduled Meds:  apixaban  2.5 mg Oral BID   diltiazem  240 mg Oral Daily   hydrocortisone  20 mg Oral Daily   hydrocortisone  5 mg Oral QHS   levothyroxine  150 mcg Oral Q0600   posaconazole  300 mg Oral Daily   Continuous Infusions:  sodium chloride     ceFEPime (MAXIPIME) IV 2 g (12/09/22 0531)   metronidazole 100 mL/hr at 12/09/22 0331   vancomycin 750 mg (12/09/22 0604)   PRN Meds:.acetaminophen **OR** acetaminophen, diazepam, famotidine, heparin lock flush, heparin lock flush, mupirocin ointment, ondansetron **OR** ondansetron (ZOFRAN) IV, sodium chloride flush   OBJECTIVE: Blood pressure (!) 151/84, pulse 81, temperature 98.3 F (36.8 C), temperature source Oral, resp. rate 18, height 5\' 8"  (1.727 m), weight 73 kg, SpO2 100%.  Physical Exam  General/constitutional: no distress, pleasant HEENT: Normocephalic, PER, Conj Clear, EOMI,  Oropharynx clear Neck supple CV: rrr no mrg Lungs: clear to auscultation, normal respiratory effort Abd: Soft, Nontender Ext: no edema Skin: No Rash Neuro: nonfocal MSK: no peripheral joint swelling/tenderness/warmth; back spines nontender     Lab Results Lab Results  Component Value Date   WBC 12.9 (H) 12/09/2022   HGB 8.4 (L) 12/09/2022   HCT 29.3 (L) 12/09/2022   MCV 80.5 12/09/2022   PLT 404 (H) 12/09/2022    Lab Results  Component Value Date   CREATININE 0.82 12/09/2022   BUN 10 12/09/2022   NA 136 12/09/2022   K 3.7 12/09/2022   CL 92 (L) 12/09/2022   CO2 32 12/09/2022    Lab Results  Component Value Date   ALT 13 12/09/2022   AST 12 (L) 12/09/2022   ALKPHOS 136 (H) 12/09/2022   BILITOT 0.8 12/09/2022      Microbiology: Recent Results (from the past 240 hour(s))  SARS Coronavirus 2 by RT PCR (hospital order, performed in [facility] hospital lab) *cepheid  single result test* Anterior Nasal Swab     Status: None   Collection Time: 12/08/22 11:48 AM   Specimen: Anterior Nasal Swab  Result Value Ref Range Status   SARS Coronavirus 2 by RT PCR NEGATIVE NEGATIVE Final    Comment: (NOTE) SARS-CoV-2 target nucleic acids are NOT DETECTED.  The SARS-CoV-2 RNA is generally detectable in upper and lower respiratory specimens during the acute phase of infection. The lowest concentration of SARS-CoV-2 viral copies this assay can detect is 250 copies / mL. A negative result does not preclude SARS-CoV-2 infection and should not be used as the sole basis for treatment or other patient management decisions.  A negative result may occur with improper specimen collection / handling, submission of specimen other than nasopharyngeal swab, presence of viral mutation(s) within the areas targeted by this assay, and inadequate number of viral copies (<250 copies / mL). A negative result must be combined with clinical observations, patient history, and epidemiological  information.  Fact Sheet for Patients:   RoadLapTop.co.za  Fact Sheet for Healthcare Providers: http://kim-miller.com/  This test is not yet approved or  cleared by the [place] FDA and has been authorized for detection and/or diagnosis of SARS-CoV-2 by FDA under an Emergency Use Authorization (EUA).  This EUA will remain in effect (meaning this test can be used) for the duration of the COVID-19 declaration under Section 564(b)(1) of the Act, 21 U.S.C. section 360bbb-3(b)(1), unless the authorization is terminated or revoked sooner.  Performed at [facility], [address], [place], [place] [address]   Culture, blood (Routine X 2) w Reflex to ID Panel     Status: None (Preliminary result)   Collection Time: 12/08/22 11:50 AM   Specimen: BLOOD  Result Value Ref Range Status   Specimen Description   Final    BLOOD LEFT ANTECUBITAL Performed at [facility], [address] [place], [place] [address]    Special Requests   Final    BOTTLES DRAWN AEROBIC AND ANAEROBIC Blood Culture adequate volume Performed at [facility], [address] [place], [place] [address]    Culture   Final    NO GROWTH < 24 HOURS Performed at [facility], [address], [place], [place] [address]    Report Status PENDING  Incomplete  Culture, blood (Routine X 2) w Reflex to ID Panel     Status: None (Preliminary result)   Collection Time: 12/08/22  1:04 PM   Specimen: BLOOD  Result Value Ref Range Status   Specimen Description   Final    BLOOD RIGHT ANTECUBITAL Performed at [facility], [address], [place], [place] [address]    Special Requests   Final    BOTTLES DRAWN AEROBIC ONLY Blood Culture adequate volume Performed at [facility], [address] [place], [place] [address]    Culture   Final    NO GROWTH < 24 HOURS Performed at  [facility], [address] [place], [place] [address]    Report Status PENDING  Incomplete     Serology:     Procedures: 05/22/22 left maxillary sinus endoscopic antrostomy and I&D Operative Findings:  Copious purulent drainage filling left maxillary sinus with polypoid mucosal edema, Fibrinous/purulent tissue along maxillary sinus mucosa. Unable to view obvious mass despite use of 45 and 70 degree scopes. Multiple biopsies taken from soft tissue deep to site of bony erosion.    Imaging: If  present, new imagings (plain films, ct scans, and mri) have been personally visualized and interpreted; radiology reports have been reviewed. Decision making incorporated into the Impression / Recommendations.  10/22 cxr FINDINGS: There is stable elevation of the right hemidiaphragm with blunting of the right costophrenic angle, likely scarring. Cardiomediastinal silhouette is stable. Surgical clips overlie the right hilum. Left lung is clear. No pneumothorax. No acute fractures are seen.   IMPRESSION: No acute findings.   10/11 ct head IMPRESSION: No acute intracranial process.    09/03/22 ct maxillofacial No substantial change in circumferential soft tissue involving the left maxillary sinus, including destruction of the posterolateral wall left and involvement of adjacent left masticator space. Relative stability of this finding favors a chronic inflammatory/infectious process over malignancy, but continued imaging follow-up is recommended.   Raymondo Band, MD [facility] [phone] pager    12/09/2022, 10:46 AM

## 2022-12-09 NOTE — Assessment & Plan Note (Signed)
On apixaban.  Off Zetia. Follow-up with cardiology as an outpatient.

## 2022-12-09 NOTE — Assessment & Plan Note (Signed)
-   Continue Cardizem and Eliquis ?

## 2022-12-10 ENCOUNTER — Telehealth: Payer: Self-pay | Admitting: Medical Oncology

## 2022-12-10 DIAGNOSIS — J320 Chronic maxillary sinusitis: Secondary | ICD-10-CM | POA: Diagnosis not present

## 2022-12-10 DIAGNOSIS — R6510 Systemic inflammatory response syndrome (SIRS) of non-infectious origin without acute organ dysfunction: Secondary | ICD-10-CM | POA: Diagnosis not present

## 2022-12-10 DIAGNOSIS — E876 Hypokalemia: Secondary | ICD-10-CM | POA: Diagnosis not present

## 2022-12-10 LAB — BASIC METABOLIC PANEL
Anion gap: 12 (ref 5–15)
BUN: 15 mg/dL (ref 8–23)
CO2: 30 mmol/L (ref 22–32)
Calcium: 8 mg/dL — ABNORMAL LOW (ref 8.9–10.3)
Chloride: 93 mmol/L — ABNORMAL LOW (ref 98–111)
Creatinine, Ser: 0.89 mg/dL (ref 0.61–1.24)
GFR, Estimated: >60 mL/min (ref >60)
Glucose, Bld: 188 mg/dL — ABNORMAL HIGH (ref 70–99)
Potassium: 2.9 mmol/L — ABNORMAL LOW (ref 3.5–5.1)
Sodium: 135 mmol/L (ref 135–145)

## 2022-12-10 LAB — CBC WITH DIFFERENTIAL/PLATELET
Abs Immature Granulocytes: 0.12 10*3/uL — ABNORMAL HIGH (ref 0.00–0.07)
Basophils Absolute: 0 10*3/uL (ref 0.0–0.1)
Basophils Relative: 0 %
Eosinophils Absolute: 0.1 10*3/uL (ref 0.0–0.5)
Eosinophils Relative: 1 %
HCT: 29.6 % — ABNORMAL LOW (ref 39.0–52.0)
Hemoglobin: 8.6 g/dL — ABNORMAL LOW (ref 13.0–17.0)
Immature Granulocytes: 1 %
Lymphocytes Relative: 6 %
Lymphs Abs: 0.8 10*3/uL (ref 0.7–4.0)
MCH: 23.2 pg — ABNORMAL LOW (ref 26.0–34.0)
MCHC: 29.1 g/dL — ABNORMAL LOW (ref 30.0–36.0)
MCV: 80 fL (ref 80.0–100.0)
Monocytes Absolute: 1 10*3/uL (ref 0.1–1.0)
Monocytes Relative: 8 %
Neutro Abs: 10.5 10*3/uL — ABNORMAL HIGH (ref 1.7–7.7)
Neutrophils Relative %: 84 %
Platelets: 423 10*3/uL — ABNORMAL HIGH (ref 150–400)
RBC: 3.7 MIL/uL — ABNORMAL LOW (ref 4.22–5.81)
RDW: 19.7 % — ABNORMAL HIGH (ref 11.5–15.5)
WBC: 12.6 10*3/uL — ABNORMAL HIGH (ref 4.0–10.5)
nRBC: 0 % (ref 0.0–0.2)

## 2022-12-10 LAB — MAGNESIUM: Magnesium: 2.1 mg/dL (ref 1.7–2.4)

## 2022-12-10 MED ORDER — POTASSIUM CHLORIDE CRYS ER 20 MEQ PO TBCR
40.0000 meq | EXTENDED_RELEASE_TABLET | ORAL | Status: AC
  Administered 2022-12-10 (×2): 40 meq via ORAL
  Filled 2022-12-10 (×2): qty 2

## 2022-12-10 MED ORDER — POTASSIUM CHLORIDE 10 MEQ/100ML IV SOLN
10.0000 meq | INTRAVENOUS | Status: AC
  Administered 2022-12-10 (×4): 10 meq via INTRAVENOUS
  Filled 2022-12-10 (×4): qty 100

## 2022-12-10 NOTE — Progress Notes (Signed)
Progress Note    [name]   ZDG:[id number]  DOB: 1945/06/04  DOA: 12/08/2022     2 PCP: [name], MD  Initial CC: hypokalemia, low Hgb  Hospital Course: [name] is a 77 yo male with PMH Stage IV RCC s/p right nephrectomy (mets to liver and omentum 2019).  He is followed closely by oncology and has been on multiple treatment regimens since diagnosis.  There has been some interaction with his regimen with posaconazole.  He has also had difficulty with hypokalemia and worsening anemia.  He elected to present to the ER for further repletion of electrolyte abnormalities. 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  He has recently been maintained on posaconazole. On workup he was found to have a leukocytosis and elevated lactic acid.  There was concern for possible sepsis and he was started on antibiotics on admission.  Interval History:  No events overnight. Feels in his usual state. Tentative plan for d/c tomorrow. Replacing potassium further today.  He will need likely prescription ongoing at discharge and outpatient monitoring.  Assessment and Plan: * SIRS (systemic inflammatory response syndrome) (HCC) - Afebrile and nontoxic-appearing on admission.  He presented to the hospital mainly for electrolyte replacement and PRBC if needed -Lab workup notable for leukocytosis and lactic acidosis.  He has not had any reported diarrhea but oral intake has been poor recently and he is more than likely volume depleted; suspect lactic acid elevated due to this rather than acute infection - encourage diet; start ensure - may need further IVF; was actually given lasix on admission - ID also following in setting of sinusitis; appreciate rec's -Continued on cefepime for now.  Follow blood cultures on Friday  Hypokalemia - replete as needed  Maxillary sinusitis - Left maxillary sinusitis noted with Aspergillus from 05/22/2022 -  follows with ID; last seen 10/12/22; plan was for repeat imaging beginning of Nov which would mark 6 mths of treatment - ID consulted while inpatient for further guidance esp in setting of potential infectious workup on admission - repeat CT maxillofacial performed 10/23: shows improvement - continue posaconazole; final course length per ID; outpatient followup with Dr. Thedore Mins on 11/11  Paroxysmal atrial fibrillation (HCC) - Continue Cardizem and Eliquis  Anemia of chronic disease - Goal hemoglobin greater than 8 g/dL per oncology recommendations - Hemoglobin 7.8 g/dL on admission - Received 1 unit PRBC - Continue trending H/H  Acquired hypothyroidism - Continue Synthroid  Essential hypertension - Continue Cardizem  Hyperlipidemia - Off zetia   Coronary artery disease On apixaban.  Off Zetia. Follow-up with cardiology as an outpatient.  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 Arbutus Ped outpatient   Old records reviewed in assessment of this patient  Antimicrobials: Flagyl 12/08/2022 >> 12/09/2022 Vancomycin 12/08/2022 >> 12/10/2022 Cefepime 12/08/2022 >> current  DVT prophylaxis:  apixaban (ELIQUIS) tablet 2.5 mg Start: 12/08/22 2200 SCDs Start: 12/08/22 1447 apixaban (ELIQUIS) tablet 2.5 mg   Code Status:   Code Status: Full Code  Mobility Assessment (Last 72 Hours)     Mobility Assessment     Row Name 12/10/22 1051 12/09/22 2157 12/09/22 1055 12/08/22 1908 12/08/22 1700   Does patient have an order for bedrest or is patient medically unstable No - Continue assessment No - Continue assessment No - Continue assessment No - Continue assessment No - Continue assessment  What is the highest level of mobility based on the progressive mobility assessment? Level 5 (Walks with assist in room/hall) - Balance while stepping forward/back and can walk in room with assist -  Complete Level 5 (Walks with assist in room/hall) - Balance while stepping forward/back and can walk in room with assist - Complete Level 5 (Walks with assist in room/hall) - Balance while stepping forward/back and can walk in room with assist - Complete Level 5 (Walks with assist in room/hall) - Balance while stepping forward/back and can walk in room with assist - Complete Level 5 (Walks with assist in room/hall) - Balance while stepping forward/back and can walk in room with assist - Complete            Barriers to discharge: none Disposition Plan:  Home Status is: Inpt  Objective: Blood pressure 137/66, pulse 77, temperature 98.3 F (36.8 C), temperature source Oral, resp. rate 18, height 5\' 8"  (1.727 m), weight 73 kg, SpO2 95%.  Examination:  Physical Exam Constitutional:      General: He is not in acute distress.    Appearance: Normal appearance.  HENT:     Head: Normocephalic and atraumatic.     Mouth/Throat:     Mouth: Mucous membranes are moist.  Eyes:     Extraocular Movements: Extraocular movements intact.  Cardiovascular:     Rate and Rhythm: Normal rate and regular rhythm.  Pulmonary:     Effort: Pulmonary effort is normal. No respiratory distress.     Breath sounds: Normal breath sounds. No wheezing.  Abdominal:     General: Bowel sounds are normal. There is no distension.     Palpations: Abdomen is soft.     Tenderness: There is no abdominal tenderness.  Musculoskeletal:        General: Normal range of motion.     Cervical back: Normal range of motion and neck supple.     Right lower leg: Edema (1-2+ pitting) present.     Left lower leg: Edema (1-2+ pitting) present.  Skin:    General: Skin is warm and dry.  Neurological:     General: No focal deficit present.     Mental Status: He is alert.  Psychiatric:        Mood and Affect: Mood normal.        Behavior: Behavior normal.      Consultants:  ID  Procedures:    Data Reviewed: Results for  orders placed or performed during the hospital encounter of 12/08/22 (from the past 24 hour(s))  Respiratory (~20 pathogens) panel by PCR     Status: None   Collection Time: 12/09/22  6:22 PM   Specimen: Nasopharyngeal Swab; Respiratory  Result Value Ref Range   Adenovirus NOT DETECTED NOT DETECTED   Coronavirus 229E NOT DETECTED NOT DETECTED   Coronavirus HKU1 NOT DETECTED NOT DETECTED   Coronavirus NL63 NOT DETECTED NOT DETECTED   Coronavirus OC43 NOT DETECTED NOT DETECTED   Metapneumovirus NOT DETECTED NOT DETECTED   Rhinovirus / Enterovirus NOT DETECTED NOT DETECTED   Influenza A NOT DETECTED NOT DETECTED   Influenza B NOT DETECTED NOT DETECTED   Parainfluenza Virus 1 NOT DETECTED NOT DETECTED   Parainfluenza Virus 2 NOT DETECTED NOT DETECTED   Parainfluenza Virus 3 NOT DETECTED NOT DETECTED   Parainfluenza Virus 4 NOT DETECTED NOT DETECTED   Respiratory Syncytial Virus NOT DETECTED NOT DETECTED   Bordetella pertussis NOT DETECTED NOT DETECTED   Bordetella Parapertussis NOT DETECTED NOT  DETECTED   Chlamydophila pneumoniae NOT DETECTED NOT DETECTED   Mycoplasma pneumoniae NOT DETECTED NOT DETECTED  CBC with Differential     Status: Abnormal   Collection Time: 12/10/22  4:43 AM  Result Value Ref Range   WBC 12.6 (H) 4.0 - 10.5 K/uL   RBC 3.70 (L) 4.22 - 5.81 MIL/uL   Hemoglobin 8.6 (L) 13.0 - 17.0 g/dL   HCT 16.1 (L) 09.6 - 04.5 %   MCV 80.0 80.0 - 100.0 fL   MCH 23.2 (L) 26.0 - 34.0 pg   MCHC 29.1 (L) 30.0 - 36.0 g/dL   RDW 40.9 (H) 81.1 - 91.4 %   Platelets 423 (H) 150 - 400 K/uL   nRBC 0.0 0.0 - 0.2 %   Neutrophils Relative % 84 %   Neutro Abs 10.5 (H) 1.7 - 7.7 K/uL   Lymphocytes Relative 6 %   Lymphs Abs 0.8 0.7 - 4.0 K/uL   Monocytes Relative 8 %   Monocytes Absolute 1.0 0.1 - 1.0 K/uL   Eosinophils Relative 1 %   Eosinophils Absolute 0.1 0.0 - 0.5 K/uL   Basophils Relative 0 %   Basophils Absolute 0.0 0.0 - 0.1 K/uL   Immature Granulocytes 1 %   Abs Immature  Granulocytes 0.12 (H) 0.00 - 0.07 K/uL  Basic metabolic panel     Status: Abnormal   Collection Time: 12/10/22  4:43 AM  Result Value Ref Range   Sodium 135 135 - 145 mmol/L   Potassium 2.9 (L) 3.5 - 5.1 mmol/L   Chloride 93 (L) 98 - 111 mmol/L   CO2 30 22 - 32 mmol/L   Glucose, Bld 188 (H) 70 - 99 mg/dL   BUN 15 8 - 23 mg/dL   Creatinine, Ser 7.82 0.61 - 1.24 mg/dL   Calcium 8.0 (L) 8.9 - 10.3 mg/dL   GFR, Estimated >95 >62 mL/min   Anion gap 12 5 - 15  Magnesium     Status: None   Collection Time: 12/10/22  4:43 AM  Result Value Ref Range   Magnesium 2.1 1.7 - 2.4 mg/dL    I have reviewed pertinent nursing notes, vitals, labs, and images as necessary. I have ordered labwork to follow up on as indicated.  I have reviewed the last notes from staff over past 24 hours. I have discussed patient's care plan and test results with nursing staff, CM/SW, and other staff as appropriate.  Time spent: Greater than 50% of the 55 minute visit was spent in counseling/coordination of care for the patient as laid out in the A&P.   LOS: 2 days   Lewie Chamber, MD [facility] 12/10/2022, 3:45 PM

## 2022-12-10 NOTE — Progress Notes (Signed)
[facility]  Date of Admission:  12/08/2022     Abx: 10/22-c vanc 10/22-c cefepime Outpatient posaconazole continued   10/22-23 flagyl                                                                Assessment: 77 yo male ckd3, rcc with recurrence/metastasis (omental biopsy 08/2017), hx right nephrectomy due to rcc, hx lung cancer, cad, gerd, hypothyroidism and 2nd adrenal insufficiency, sent from cancer center 10/22 due to fever   Fever -- no clear source Aspergillus rhinosinusitis Recurrent rcc stage 4-- astininib first dose 07/29/22; d/c'ed 11/12/22 due to disease progression; recent imaging concerning for hepatic metastasis and increasing abdominal adenopathy, and a large mass in region of uncinate process of the pancrea that is enlarging as well   Hx panhypopituitarism and AI due to immune check point inhibitor nivolumab and ipilimumab and patient received astininib since 07/29/22 but stopped 11/12/22 due to disease progression as mention   Planned outpatient [facility] 2nd opinion     He has had failure to thrive picture the past few months. Sinus pain better since surgery   10/22 bcx negative 05/22/22 left maxillary sinus bacterial culture -- mold --> reflex id'ed aspergillus fumigatus. Of note the fungal ball/mass left there        I suspect this is not due to aspergillus related fever In setting of progression of RCC burden I suspect cancer is due to that No other localizing sx to suggest other infectious syndrome. Will get respiratory viral pcr to r/o viral infection Bcx is cooking and can deescalate abx if returns negative     I reviewed dr Doristine Church plan for patient's posaconazole. Will repeat ct scan today. If stable, I think it's reasonable to stop posaconazole and see how he does. Saprophytic fungal ball can't be treated with antifungal. The posaconazole was for erosion into the sinus wall.   --------------- 12/10/22 id  assessment Reviewed maxillofacial ct with patient, improvement in soft tissue mass density. Stable erosive changes in sinus wall Clinically again no pain/nasal discharge Afebrile here Bcx 10/22 ngtd Respiratory viral pcr negative No other localizing sx outside of lack of appetite/generalized weakness which is chronic      Plan: Stop vancomycin Tomorrow 10/25 if continued negative bcx can stop cefepime  Can discharge after tomorrow from id standpoint if bcx remains negative Will continue posaconazole for now and he'll follow up with dr Danelle Earthly on 11/11 to determine stopage of this Discussed with primary team   Principal Problem:   SIRS (systemic inflammatory response syndrome) (HCC) Active Problems:   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   GERD (gastroesophageal reflux disease)   Coronary artery disease   Hyperlipidemia   Essential hypertension   Acquired hypothyroidism   Maxillary sinusitis   Anemia of chronic disease   Paroxysmal atrial fibrillation (HCC)   Hypokalemia   Allergies  Allergen Reactions   Diphenhydramine Other (See Comments)    Severe restless legs    Scheduled Meds:  apixaban  2.5 mg Oral BID   diltiazem  240 mg Oral Daily   feeding supplement  237 mL Oral TID BM   hydrocortisone  20 mg Oral Daily  hydrocortisone  5 mg Oral QHS   levothyroxine  150 mcg Oral Q0600   posaconazole  300 mg Oral Daily   potassium chloride  40 mEq Oral Q4H   Continuous Infusions:  sodium chloride     ceFEPime (MAXIPIME) IV 2 g (12/10/22 0533)   potassium chloride 10 mEq (12/10/22 1047)   PRN Meds:.acetaminophen **OR** acetaminophen, diazepam, famotidine, heparin lock flush, heparin lock flush, mupirocin ointment, ondansetron **OR** ondansetron (ZOFRAN) IV, sodium chloride flush   SUBJECTIVE: No complaint Stable chronic poor appetite/weakness No facial pain No f/c No diarrhea No rash No joint pain   Review of  Systems: ROS All other ROS was negative, except mentioned above     OBJECTIVE: Vitals:   12/09/22 0608 12/09/22 1341 12/09/22 2204 12/10/22 0549  BP: (!) 151/84 137/65 (!) 145/75 137/66  Pulse: 81 74 80 77  Resp: 18 17 18 18   Temp: 98.3 F (36.8 C) 97.9 F (36.6 C) 98.9 F (37.2 C) 98.3 F (36.8 C)  TempSrc: Oral Oral Oral Oral  SpO2: 100% 95% 96% 95%  Weight:      Height:       Body mass index is 24.48 kg/m.  Physical Exam  General/constitutional: no distress, pleasant HEENT: Normocephalic, PER, Conj Clear, EOMI, Oropharynx clear Neck supple CV: rrr no mrg Lungs: clear to auscultation, normal respiratory effort Abd: Soft, Nontender Ext: no edema Skin: No Rash Neuro: nonfocal MSK: no peripheral joint swelling/tenderness/warmth; back spines nontender   Lab Results Lab Results  Component Value Date   WBC 12.6 (H) 12/10/2022   HGB 8.6 (L) 12/10/2022   HCT 29.6 (L) 12/10/2022   MCV 80.0 12/10/2022   PLT 423 (H) 12/10/2022    Lab Results  Component Value Date   CREATININE 0.89 12/10/2022   BUN 15 12/10/2022   NA 135 12/10/2022   K 2.9 (L) 12/10/2022   CL 93 (L) 12/10/2022   CO2 30 12/10/2022    Lab Results  Component Value Date   ALT 13 12/09/2022   AST 12 (L) 12/09/2022   ALKPHOS 136 (H) 12/09/2022   BILITOT 0.8 12/09/2022      Microbiology: Recent Results (from the past 240 hour(s))  SARS Coronavirus 2 by RT PCR (hospital order, performed in [facility] hospital lab) *cepheid single result test* Anterior Nasal Swab     Status: None   Collection Time: 12/08/22 11:48 AM   Specimen: Anterior Nasal Swab  Result Value Ref Range Status   SARS Coronavirus 2 by RT PCR NEGATIVE NEGATIVE Final    Comment: (NOTE) SARS-CoV-2 target nucleic acids are NOT DETECTED.  The SARS-CoV-2 RNA is generally detectable in upper and lower respiratory specimens during the acute phase of infection. The lowest concentration of SARS-CoV-2 viral copies this assay can  detect is 250 copies / mL. A negative result does not preclude SARS-CoV-2 infection and should not be used as the sole basis for treatment or other patient management decisions.  A negative result may occur with improper specimen collection / handling, submission of specimen other than nasopharyngeal swab, presence of viral mutation(s) within the areas targeted by this assay, and inadequate number of viral copies (<250 copies / mL). A negative result must be combined with clinical observations, patient history, and epidemiological information.  Fact Sheet for Patients:   RoadLapTop.co.za  Fact Sheet for Healthcare Providers: http://kim-miller.com/  This test is not yet approved or  cleared by the [place] FDA and has been authorized for detection and/or diagnosis of SARS-CoV-2 by  FDA under an Emergency Use Authorization (EUA).  This EUA will remain in effect (meaning this test can be used) for the duration of the COVID-19 declaration under Section 564(b)(1) of the Act, 21 U.S.C. section 360bbb-3(b)(1), unless the authorization is terminated or revoked sooner.  Performed at [facility], [address] [place], [place] [address]   Culture, blood (Routine X 2) w Reflex to ID Panel     Status: None (Preliminary result)   Collection Time: 12/08/22 11:50 AM   Specimen: BLOOD  Result Value Ref Range Status   Specimen Description   Final    BLOOD LEFT ANTECUBITAL Performed at [facility] [address], [place], [place] [address]    Special Requests   Final    BOTTLES DRAWN AEROBIC AND ANAEROBIC Blood Culture adequate volume Performed at [facility], [address] [place], [place] [address]    Culture   Final    NO GROWTH 2 DAYS Performed at [facility], [address], [place], [place] [address]    Report Status PENDING  Incomplete  Culture, blood (Routine X 2)  w Reflex to ID Panel     Status: None (Preliminary result)   Collection Time: 12/08/22  1:04 PM   Specimen: BLOOD  Result Value Ref Range Status   Specimen Description   Final    BLOOD RIGHT ANTECUBITAL Performed at [facility], [address], [place], [place] [address]    Special Requests   Final    BOTTLES DRAWN AEROBIC ONLY Blood Culture adequate volume Performed at [facility], [address], [place], [place] [address]    Culture   Final    NO GROWTH 2 DAYS Performed at [facility], [address], [place], [place] [address]    Report Status PENDING  Incomplete  Respiratory (~20 pathogens) panel by PCR     Status: None   Collection Time: 12/09/22  6:22 PM   Specimen: Nasopharyngeal Swab; Respiratory  Result Value Ref Range Status   Adenovirus NOT DETECTED NOT DETECTED Final   Coronavirus 229E NOT DETECTED NOT DETECTED Final    Comment: (NOTE) The Coronavirus on the Respiratory Panel, DOES NOT test for the novel  Coronavirus (2019 nCoV)    Coronavirus HKU1 NOT DETECTED NOT DETECTED Final   Coronavirus NL63 NOT DETECTED NOT DETECTED Final   Coronavirus OC43 NOT DETECTED NOT DETECTED Final   Metapneumovirus NOT DETECTED NOT DETECTED Final   Rhinovirus / Enterovirus NOT DETECTED NOT DETECTED Final   Influenza A NOT DETECTED NOT DETECTED Final   Influenza B NOT DETECTED NOT DETECTED Final   Parainfluenza Virus 1 NOT DETECTED NOT DETECTED Final   Parainfluenza Virus 2 NOT DETECTED NOT DETECTED Final   Parainfluenza Virus 3 NOT DETECTED NOT DETECTED Final   Parainfluenza Virus 4 NOT DETECTED NOT DETECTED Final   Respiratory Syncytial Virus NOT DETECTED NOT DETECTED Final   Bordetella pertussis NOT DETECTED NOT DETECTED Final   Bordetella Parapertussis NOT DETECTED NOT DETECTED Final   Chlamydophila pneumoniae NOT DETECTED NOT DETECTED Final   Mycoplasma pneumoniae NOT DETECTED NOT DETECTED Final    Comment: Performed at [facility], [address] [place], [place] [address]     Serology:   Imaging: If present, new imagings (plain films, ct scans, and mri) have been personally visualized and interpreted; radiology reports have been reviewed. Decision making incorporated into the Impression / Recommendations.   10/23  ct maxillofacial Greatly decreased soft tissue in the left maxillary sinus. Unchanged associated bone destruction and mildly improved soft tissue infiltrating the regional deep spaces as above. No new or progressive finding   Raymondo Band, MD [facility] [phone] pager    12/10/2022, 12:30 PM

## 2022-12-10 NOTE — Plan of Care (Signed)
  Problem: Education: Goal: Knowledge of General Education information will improve Description: Including pain rating scale, medication(s)/side effects and non-pharmacologic comfort measures 12/10/2022 0226 by Duffy Bruce, RN Outcome: Progressing 12/10/2022 0226 by Duffy Bruce, RN Outcome: Progressing   Problem: Health Behavior/Discharge Planning: Goal: Ability to manage health-related needs will improve 12/10/2022 0226 by Duffy Bruce, RN Outcome: Progressing 12/10/2022 0226 by Duffy Bruce, RN Outcome: Progressing   Problem: Clinical Measurements: Goal: Respiratory complications will improve 12/10/2022 0226 by Duffy Bruce, RN Outcome: Progressing 12/10/2022 0226 by Duffy Bruce, RN Outcome: Progressing   Problem: Nutrition: Goal: Adequate nutrition will be maintained 12/10/2022 0226 by Duffy Bruce, RN Outcome: Progressing 12/10/2022 0226 by Duffy Bruce, RN Outcome: Progressing   Problem: Coping: Goal: Level of anxiety will decrease 12/10/2022 0226 by Duffy Bruce, RN Outcome: Progressing 12/10/2022 0226 by Duffy Bruce, RN Outcome: Progressing   Problem: Pain Management: Goal: General experience of comfort will improve 12/10/2022 0226 by Duffy Bruce, RN Outcome: Progressing 12/10/2022 0226 by Duffy Bruce, RN Outcome: Progressing   Problem: Safety: Goal: Ability to remain free from injury will improve 12/10/2022 0226 by Duffy Bruce, RN Outcome: Progressing 12/10/2022 0226 by Duffy Bruce, RN Outcome: Progressing

## 2022-12-10 NOTE — Telephone Encounter (Signed)
Dx code called to [employer] C64.1

## 2022-12-10 NOTE — Assessment & Plan Note (Signed)
-   Follows with Dr. Arbutus Ped outpatient

## 2022-12-11 DIAGNOSIS — E876 Hypokalemia: Secondary | ICD-10-CM | POA: Diagnosis not present

## 2022-12-11 DIAGNOSIS — R6510 Systemic inflammatory response syndrome (SIRS) of non-infectious origin without acute organ dysfunction: Secondary | ICD-10-CM | POA: Diagnosis not present

## 2022-12-11 DIAGNOSIS — J320 Chronic maxillary sinusitis: Secondary | ICD-10-CM | POA: Diagnosis not present

## 2022-12-11 LAB — BASIC METABOLIC PANEL
Anion gap: 12 (ref 5–15)
BUN: 19 mg/dL (ref 8–23)
CO2: 25 mmol/L (ref 22–32)
Calcium: 8.3 mg/dL — ABNORMAL LOW (ref 8.9–10.3)
Chloride: 99 mmol/L (ref 98–111)
Creatinine, Ser: 1.09 mg/dL (ref 0.61–1.24)
GFR, Estimated: >60 mL/min (ref >60)
Glucose, Bld: 239 mg/dL — ABNORMAL HIGH (ref 70–99)
Potassium: 3.8 mmol/L (ref 3.5–5.1)
Sodium: 136 mmol/L (ref 135–145)

## 2022-12-11 LAB — CBC WITH DIFFERENTIAL/PLATELET
Abs Immature Granulocytes: 0.15 10*3/uL — ABNORMAL HIGH (ref 0.00–0.07)
Basophils Absolute: 0 10*3/uL (ref 0.0–0.1)
Basophils Relative: 0 %
Eosinophils Absolute: 0 10*3/uL (ref 0.0–0.5)
Eosinophils Relative: 0 %
HCT: 33 % — ABNORMAL LOW (ref 39.0–52.0)
Hemoglobin: 9.2 g/dL — ABNORMAL LOW (ref 13.0–17.0)
Immature Granulocytes: 1 %
Lymphocytes Relative: 6 %
Lymphs Abs: 1 10*3/uL (ref 0.7–4.0)
MCH: 23 pg — ABNORMAL LOW (ref 26.0–34.0)
MCHC: 27.9 g/dL — ABNORMAL LOW (ref 30.0–36.0)
MCV: 82.5 fL (ref 80.0–100.0)
Monocytes Absolute: 1.1 10*3/uL — ABNORMAL HIGH (ref 0.1–1.0)
Monocytes Relative: 7 %
Neutro Abs: 13.7 10*3/uL — ABNORMAL HIGH (ref 1.7–7.7)
Neutrophils Relative %: 86 %
Platelets: 521 10*3/uL — ABNORMAL HIGH (ref 150–400)
RBC: 4 MIL/uL — ABNORMAL LOW (ref 4.22–5.81)
RDW: 19.9 % — ABNORMAL HIGH (ref 11.5–15.5)
WBC: 16 10*3/uL — ABNORMAL HIGH (ref 4.0–10.5)
nRBC: 0 % (ref 0.0–0.2)

## 2022-12-11 LAB — MAGNESIUM: Magnesium: 2.1 mg/dL (ref 1.7–2.4)

## 2022-12-11 MED ORDER — POTASSIUM CHLORIDE 20 MEQ/15ML (10%) PO SOLN: 40.0000 meq | Freq: Every day | ORAL | 0 refills | Status: DC

## 2022-12-11 NOTE — Consult Note (Signed)
[facility]     12/11/2022  [name] [birth year] [id number]  [facility] [[facility]]   Primary Care Provider: [name], MD  this provider is listed for the  follow up appointments    [facility] Liaison screened the patient remotely at [facility].  Called hospital bedside phone and spoke with Karin Golden on DPR], daughter, HIPAA verified, who states patient was getting dressed to go home.    The patient was screened for noted high risk score for unplanned readmission risk hospital admissions 2 in 6 months. Explained reason for call to check for post hospital follow up needs.  Daughter states that they are followed closely with Oncology team and also PCP if needs arises.  The patient was assessed for potential Community Care Coordination service needs for post hospital transition for care coordination. Review of patient's electronic medical record reveals patient is for home..   Plan: No needs at this time.   [facility]I Community Care Management/Population Health does not replace or interfere with any arrangements made by the Inpatient Transition of Care team.   For questions contact:   Charlesetta Shanks, RN, BSN, CCM [place]  [facility] Liaison Direct Dial: [phone] or secure chat Website: [email or web address]

## 2022-12-11 NOTE — Discharge Summary (Signed)
Physician Discharge Summary   [name] WJX:[id number] DOB: [birth year] DOA: 12/08/2022  PCP: Alex Mask, MD  Admit date: 12/08/2022 Discharge date: 12/11/2022   Admitted From: Home Disposition:  Home Discharging physician: Alex Chamber, MD Barriers to discharge: none  Recommendations at discharge: Follow up with ID as planned; discuss posaconazole length  Discharge Condition: stable CODE STATUS: Full Diet recommendation:  Diet Orders (From admission, onward)     Start     Ordered   12/11/22 0000  Diet general        12/11/22 1228   12/10/22 0850  Diet regular Fluid consistency: Thin  Diet effective now       Question:  Fluid consistency:  Answer:  Thin   12/10/22 0850            Hospital Course: [name] is a 77 yo male with PMH Stage IV RCC s/p right nephrectomy (mets to liver and omentum 2019).  He is followed closely by oncology and has been on multiple treatment regimens since diagnosis.  There has been some interaction with his regimen with posaconazole.  He has also had difficulty with hypokalemia and worsening anemia.  He elected to present to the ER for further repletion of electrolyte abnormalities. 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  He has recently been maintained on posaconazole. On workup he was found to have a leukocytosis and elevated lactic acid.  There was concern for possible sepsis and he was started on antibiotics on admission.  Due to underlying malignancy contributing to possible SIRS/sepsis picture, ID was consulted.  Blood cultures remained negative.  CT maxillofacial was obtained which showed improvement in underlying fungal sinusitis.  Antibiotics were slowly de-escalated during hospitalization and he was not continued on any further antibiotics except for posaconazole.  He will follow-up outpatient with ID as previously scheduled to discuss final duration length of  antifungal coverage.  Assessment and Plan: * SIRS (systemic inflammatory response syndrome) (HCC) - Afebrile and nontoxic-appearing on admission.  He presented to the hospital mainly for electrolyte replacement and PRBC if needed -Lab workup notable for leukocytosis and lactic acidosis.  He has not had any reported diarrhea but oral intake has been poor recently and he is more than likely volume depleted; suspect lactic acid elevated due to this rather than acute infection - encourage diet -Appreciate ID evaluation.  Antibiotics de-escalated and SIRS criteria considered due to underlying malignancy - No further antibiotics at discharge  Hypokalemia - repleted  Maxillary sinusitis - Left maxillary sinusitis noted with Aspergillus from 05/22/2022 - follows with ID; last seen 10/12/22; plan was for repeat imaging beginning of Nov which would mark 6 mths of treatment - ID consulted while inpatient for further guidance esp in setting of potential infectious workup on admission - repeat CT maxillofacial performed 10/23: shows improvement - continue posaconazole; final course length per ID; outpatient followup with Dr. Thedore Perry on 11/11  Paroxysmal atrial fibrillation (HCC) - Continue Cardizem and Eliquis  Anemia of chronic disease - Goal hemoglobin greater than 8 g/dL per oncology recommendations - Hemoglobin 7.8 g/dL on admission - Received 1 unit PRBC -Hemoglobin remained stable, 9.2 g/dL at discharge  Acquired hypothyroidism - Continue Synthroid  Essential hypertension - Continue Cardizem  Hyperlipidemia - Off zetia   Coronary artery disease On apixaban.  Off Zetia. Follow-up with cardiology as an outpatient.  GERD (gastroesophageal reflux disease) - Continue Pepcid  Clear cell renal cell carcinoma s/p  robotic nephrectomy Dec 2016 - Follows with Dr. Arbutus Ped outpatient   The patient's acute and chronic medical conditions were treated accordingly. On day of discharge, patient  was felt deemed stable for discharge. Patient/family member advised to call PCP or come back to ER if needed.   Principal Diagnosis: SIRS (systemic inflammatory response syndrome) (HCC)  Discharge Diagnoses: Active Hospital Problems   Diagnosis Date Noted   SIRS (systemic inflammatory response syndrome) (HCC) 12/08/2022    Priority: 1.   Hypokalemia 12/09/2022    Priority: 2.   Maxillary sinusitis 05/22/2022    Priority: 2.   Paroxysmal atrial fibrillation (HCC) 12/08/2022   Anemia of chronic disease 06/24/2022   Acquired hypothyroidism 04/19/2018   Essential hypertension    Hyperlipidemia 11/30/2017   GERD (gastroesophageal reflux disease)    Coronary artery disease    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 02/01/2015    Resolved Hospital Problems  No resolved problems to display.     Discharge Instructions     Diet general   Complete by: As directed    Increase activity slowly   Complete by: As directed       Allergies as of 12/11/2022       Reactions   Diphenhydramine Other (See Comments)   Severe restless legs        Medication List     STOP taking these medications    OLANZapine 5 MG tablet Commonly known as: ZYPREXA       TAKE these medications    acetaminophen 500 MG tablet Commonly known as: TYLENOL Take 1,000 mg by mouth 2 (two) times daily.   apixaban 2.5 MG Tabs tablet Commonly known as: ELIQUIS Take 1 tablet (2.5 mg total) by mouth 2 (two) times daily.   Centrum Silver 50+Men Tabs Take 1 tablet by mouth daily with breakfast.   diazepam 2 MG tablet Commonly known as: VALIUM TAKE 1-2 TABLETS BY MOUTH EVERY 8 HOURS AS NEEDED FOR ANXIETY What changed: See the new instructions.   diltiazem 240 MG 24 hr capsule Commonly known as: CARDIZEM CD Take 1 capsule (240 mg total) by mouth in the morning.   ezetimibe 10 MG tablet Commonly known as: ZETIA Take 1 tablet (10 mg total) by mouth daily.   famotidine 20  MG tablet Commonly known as: PEPCID Take 20 mg by mouth 2 (two) times daily as needed.   fluticasone 50 MCG/ACT nasal spray Commonly known as: FLONASE Place 2 sprays into both nostrils daily as needed for allergies.   hydrocortisone 5 MG tablet Commonly known as: CORTEF Take by mouth 15 mg in am and 5 mg in pm What changed:  how much to take how to take this when to take this additional instructions   Inlyta 1 MG tablet Generic drug: axitinib Take 2 tablets (2 mg total) by mouth 2 (two) times daily.   levothyroxine 150 MCG tablet Commonly known as: SYNTHROID Take 1 tablet (150 mcg total) by mouth daily before breakfast.   loratadine 10 MG tablet Commonly known as: CLARITIN Take 10 mg by mouth daily as needed for allergies.   megestrol 20 MG tablet Commonly known as: MEGACE TAKE 1 TABLET BY MOUTH DAILY   mupirocin ointment 2 % Commonly known as: BACTROBAN Place 1 Application into the nose daily as needed (sores).   posaconazole 100 MG Tbec delayed-release tablet Commonly known as: NOXAFIL Take 3 tablets (300 mg total) by mouth daily.   potassium chloride 20 MEQ/15ML (10%) Soln  Take 30 mLs (40 mEq total) by mouth daily for 4 days.   traZODone 150 MG tablet Commonly known as: DESYREL Take 150 mg by mouth at bedtime as needed for sleep.   VITAMIN D PO Take 1 capsule by mouth daily.   Welireg 40 MG tablet Generic drug: belzutifan Take 3 tablets (120 mg total) by mouth daily.        Allergies  Allergen Reactions   Diphenhydramine Other (See Comments)    Severe restless legs    Consultations: ID  Procedures:   Discharge Exam: BP 134/73 (BP Location: Right Arm)   Pulse 76   Temp 98.2 F (36.8 C) (Oral)   Resp 18   Ht 5\' 8"  (1.727 m)   Wt 73 kg   SpO2 98%   BMI 24.48 kg/m  Physical Exam Constitutional:      General: He is not in acute distress.    Appearance: Normal appearance.  HENT:     Head: Normocephalic and atraumatic.      Mouth/Throat:     Mouth: Mucous membranes are moist.  Eyes:     Extraocular Movements: Extraocular movements intact.  Cardiovascular:     Rate and Rhythm: Normal rate and regular rhythm.  Pulmonary:     Effort: Pulmonary effort is normal. No respiratory distress.     Breath sounds: Normal breath sounds. No wheezing.  Abdominal:     General: Bowel sounds are normal. There is no distension.     Palpations: Abdomen is soft.     Tenderness: There is no abdominal tenderness.  Musculoskeletal:        General: Normal range of motion.     Cervical back: Normal range of motion and neck supple.     Right lower leg: Edema (1-2+ pitting) present.     Left lower leg: Edema (1-2+ pitting) present.  Skin:    General: Skin is warm and dry.  Neurological:     General: No focal deficit present.     Mental Status: He is alert.  Psychiatric:        Mood and Affect: Mood normal.        Behavior: Behavior normal.      The results of significant diagnostics from this hospitalization (including imaging, microbiology, ancillary and laboratory) are listed below for reference.   Microbiology: Recent Results (from the past 240 hour(s))  SARS Coronavirus 2 by RT PCR (hospital order, performed in [facility] hospital lab) *cepheid single result test* Anterior Nasal Swab     Status: None   Collection Time: 12/08/22 11:48 AM   Specimen: Anterior Nasal Swab  Result Value Ref Range Status   SARS Coronavirus 2 by RT PCR NEGATIVE NEGATIVE Final    Comment: (NOTE) SARS-CoV-2 target nucleic acids are NOT DETECTED.  The SARS-CoV-2 RNA is generally detectable in upper and lower respiratory specimens during the acute phase of infection. The lowest concentration of SARS-CoV-2 viral copies this assay can detect is 250 copies / mL. A negative result does not preclude SARS-CoV-2 infection and should not be used as the sole basis for treatment or other patient management decisions.  A negative result may occur  with improper specimen collection / handling, submission of specimen other than nasopharyngeal swab, presence of viral mutation(s) within the areas targeted by this assay, and inadequate number of viral copies (<250 copies / mL). A negative result must be combined with clinical observations, patient history, and epidemiological information.  Fact Sheet for Patients:   RoadLapTop.co.za  Fact Sheet for Healthcare Providers: http://kim-miller.com/  This test is not yet approved or  cleared by the [place] FDA and has been authorized for detection and/or diagnosis of SARS-CoV-2 by FDA under an Emergency Use Authorization (EUA).  This EUA will remain in effect (meaning this test can be used) for the duration of the COVID-19 declaration under Section 564(b)(1) of the Act, 21 U.S.C. section 360bbb-3(b)(1), unless the authorization is terminated or revoked sooner.  Performed at [facility], [address] [place], [place] [address]   Culture, blood (Routine X 2) w Reflex to ID Panel     Status: None (Preliminary result)   Collection Time: 12/08/22 11:50 AM   Specimen: BLOOD  Result Value Ref Range Status   Specimen Description   Final    BLOOD LEFT ANTECUBITAL Performed at [facility], [address] [place], [place] [address]    Special Requests   Final    BOTTLES DRAWN AEROBIC AND ANAEROBIC Blood Culture adequate volume Performed at [facility], [address], [place], [place] [address]    Culture   Final    NO GROWTH 3 DAYS Performed at [facility], [address], [place], [place] [address]    Report Status PENDING  Incomplete  Culture, blood (Routine X 2) w Reflex to ID Panel     Status: None (Preliminary result)   Collection Time: 12/08/22  1:04 PM   Specimen: BLOOD  Result Value Ref Range Status   Specimen Description   Final    BLOOD RIGHT  ANTECUBITAL Performed at [facility], [address], [place], [place] [address]    Special Requests   Final    BOTTLES DRAWN AEROBIC ONLY Blood Culture adequate volume Performed at [facility], [address], [place], [place] [address]    Culture   Final    NO GROWTH 3 DAYS Performed at [facility], [address] [place], [place] [address]    Report Status PENDING  Incomplete  Respiratory (~20 pathogens) panel by PCR     Status: None   Collection Time: 12/09/22  6:22 PM   Specimen: Nasopharyngeal Swab; Respiratory  Result Value Ref Range Status   Adenovirus NOT DETECTED NOT DETECTED Final   Coronavirus 229E NOT DETECTED NOT DETECTED Final    Comment: (NOTE) The Coronavirus on the Respiratory Panel, DOES NOT test for the novel  Coronavirus (2019 nCoV)    Coronavirus HKU1 NOT DETECTED NOT DETECTED Final   Coronavirus NL63 NOT DETECTED NOT DETECTED Final   Coronavirus OC43 NOT DETECTED NOT DETECTED Final   Metapneumovirus NOT DETECTED NOT DETECTED Final   Rhinovirus / Enterovirus NOT DETECTED NOT DETECTED Final   Influenza A NOT DETECTED NOT DETECTED Final   Influenza B NOT DETECTED NOT DETECTED Final   Parainfluenza Virus 1 NOT DETECTED NOT DETECTED Final   Parainfluenza Virus 2 NOT DETECTED NOT DETECTED Final   Parainfluenza Virus 3 NOT DETECTED NOT DETECTED Final   Parainfluenza Virus 4 NOT DETECTED NOT DETECTED Final   Respiratory Syncytial Virus NOT DETECTED NOT DETECTED Final   Bordetella pertussis NOT DETECTED NOT DETECTED Final   Bordetella Parapertussis NOT DETECTED NOT DETECTED Final   Chlamydophila pneumoniae NOT DETECTED NOT DETECTED Final   Mycoplasma pneumoniae NOT DETECTED NOT DETECTED Final    Comment: Performed at [facility], [address], [place], [place] [address]     Labs: BNP (last 3 results)  No results for input(s): "BNP" in the last 8760 hours. Basic Metabolic Panel: Recent Labs  Lab  12/08/22 0928 12/08/22 2134 12/09/22 0501 12/10/22 0443 12/11/22 0510  NA 140 137 136 135 136  K 2.6* 2.6* 3.7 2.9* 3.8  CL 93* 93* 92* 93* 99  CO2 38* 32 32 30 25  GLUCOSE 202* 153* 132* 188* 239*  BUN 8 10 10 15 19   CREATININE 0.84 0.94 0.82 0.89 1.09  CALCIUM 9.0 8.0* 8.1* 8.0* 8.3*  MG  --  1.7  --  2.1 2.1  PHOS  --  2.5  --   --   --    Liver Function Tests: Recent Labs  Lab 12/08/22 0928 12/09/22 0501  AST 13* 12*  ALT 13 13  ALKPHOS 175* 136*  BILITOT 0.5 0.8  PROT 6.7 5.6*  ALBUMIN 3.0* 2.0*   No results for input(s): "LIPASE", "AMYLASE" in the last 168 hours. No results for input(s): "AMMONIA" in the last 168 hours. CBC: Recent Labs  Lab 12/08/22 0928 12/08/22 2134 12/09/22 0501 12/10/22 0443 12/11/22 0510  WBC 15.4* 12.0* 12.9* 12.6* 16.0*  NEUTROABS 12.8*  --  10.4* 10.5* 13.7*  HGB 7.8* 8.5* 8.4* 8.6* 9.2*  HCT 27.7* 30.0* 29.3* 29.6* 33.0*  MCV 78.0* 81.3 80.5 80.0 82.5  PLT 520* 411* 404* 423* 521*   Cardiac Enzymes: No results for input(s): "CKTOTAL", "CKMB", "CKMBINDEX", "TROPONINI" in the last 168 hours. BNP: Invalid input(s): "POCBNP" CBG: No results for input(s): "GLUCAP" in the last 168 hours. D-Dimer No results for input(s): "DDIMER" in the last 72 hours. Hgb A1c No results for input(s): "HGBA1C" in the last 72 hours. Lipid Profile No results for input(s): "CHOL", "HDL", "LDLCALC", "TRIG", "CHOLHDL", "LDLDIRECT" in the last 72 hours. Thyroid function studies No results for input(s): "TSH", "T4TOTAL", "T3FREE", "THYROIDAB" in the last 72 hours.  Invalid input(s): "FREET3" Anemia work up No results for input(s): "VITAMINB12", "FOLATE", "FERRITIN", "TIBC", "IRON", "RETICCTPCT" in the last 72 hours. Urinalysis    Component Value Date/Time   COLORURINE YELLOW 12/08/2022 1148   APPEARANCEUR CLEAR 12/08/2022 1148   LABSPEC 1.011 12/08/2022 1148   PHURINE 7.0 12/08/2022 1148   GLUCOSEU 50 (A) 12/08/2022 1148   HGBUR NEGATIVE  12/08/2022 1148   BILIRUBINUR NEGATIVE 12/08/2022 1148   KETONESUR NEGATIVE 12/08/2022 1148   PROTEINUR 100 (A) 12/08/2022 1148   UROBILINOGEN 0.2 11/12/2009 1000   NITRITE NEGATIVE 12/08/2022 1148   LEUKOCYTESUR NEGATIVE 12/08/2022 1148   Sepsis Labs Recent Labs  Lab 12/08/22 2134 12/09/22 0501 12/10/22 0443 12/11/22 0510  WBC 12.0* 12.9* 12.6* 16.0*   Microbiology Recent Results (from the past 240 hour(s))  SARS Coronavirus 2 by RT PCR (hospital order, performed in [facility] hospital lab) *cepheid single result test* Anterior Nasal Swab     Status: None   Collection Time: 12/08/22 11:48 AM   Specimen: Anterior Nasal Swab  Result Value Ref Range Status   SARS Coronavirus 2 by RT PCR NEGATIVE NEGATIVE Final    Comment: (NOTE) SARS-CoV-2 target nucleic acids are NOT DETECTED.  The SARS-CoV-2 RNA is generally detectable in upper and lower respiratory specimens during the acute phase of infection. The lowest concentration of SARS-CoV-2 viral copies this assay can detect is 250 copies / mL. A negative result does not preclude SARS-CoV-2 infection and should not be used as the sole basis for treatment or other patient management decisions.  A negative result may occur with improper specimen collection / handling, submission of specimen other than nasopharyngeal  swab, presence of viral mutation(s) within the areas targeted by this assay, and inadequate number of viral copies (<250 copies / mL). A negative result must be combined with clinical observations, patient history, and epidemiological information.  Fact Sheet for Patients:   RoadLapTop.co.za  Fact Sheet for Healthcare Providers: http://kim-miller.com/  This test is not yet approved or  cleared by the [place] FDA and has been authorized for detection and/or diagnosis of SARS-CoV-2 by FDA under an Emergency Use Authorization (EUA).  This EUA will remain in effect  (meaning this test can be used) for the duration of the COVID-19 declaration under Section 564(b)(1) of the Act, 21 U.S.C. section 360bbb-3(b)(1), unless the authorization is terminated or revoked sooner.  Performed at [facility], [address], [place], [place] [address]   Culture, blood (Routine X 2) w Reflex to ID Panel     Status: None (Preliminary result)   Collection Time: 12/08/22 11:50 AM   Specimen: BLOOD  Result Value Ref Range Status   Specimen Description   Final    BLOOD LEFT ANTECUBITAL Performed at [facility], [address] [place], [place] [address]    Special Requests   Final    BOTTLES DRAWN AEROBIC AND ANAEROBIC Blood Culture adequate volume Performed at [facility], [address] [place], [place] [address]    Culture   Final    NO GROWTH 3 DAYS Performed at [facility], [address] [place], [place] [address]    Report Status PENDING  Incomplete  Culture, blood (Routine X 2) w Reflex to ID Panel     Status: None (Preliminary result)   Collection Time: 12/08/22  1:04 PM   Specimen: BLOOD  Result Value Ref Range Status   Specimen Description   Final    BLOOD RIGHT ANTECUBITAL Performed at [facility], [address], [place], [place] [address]    Special Requests   Final    BOTTLES DRAWN AEROBIC ONLY Blood Culture adequate volume Performed at [facility], [address] [place], [place] [address]    Culture   Final    NO GROWTH 3 DAYS Performed at [facility], [address], [place], [place] [address]    Report Status PENDING  Incomplete  Respiratory (~20 pathogens) panel by PCR     Status: None   Collection Time: 12/09/22  6:22 PM   Specimen: Nasopharyngeal Swab; Respiratory  Result Value Ref Range Status   Adenovirus NOT DETECTED NOT DETECTED Final   Coronavirus 229E NOT DETECTED NOT DETECTED Final    Comment: (NOTE) The  Coronavirus on the Respiratory Panel, DOES NOT test for the novel  Coronavirus (2019 nCoV)    Coronavirus HKU1 NOT DETECTED NOT DETECTED Final   Coronavirus NL63 NOT DETECTED NOT DETECTED Final   Coronavirus OC43 NOT DETECTED NOT DETECTED Final   Metapneumovirus NOT DETECTED NOT DETECTED Final   Rhinovirus / Enterovirus NOT DETECTED NOT DETECTED Final   Influenza A NOT DETECTED NOT DETECTED Final   Influenza B NOT DETECTED NOT DETECTED Final   Parainfluenza Virus 1 NOT DETECTED NOT DETECTED Final   Parainfluenza Virus 2 NOT DETECTED NOT DETECTED Final   Parainfluenza Virus 3 NOT DETECTED NOT DETECTED Final   Parainfluenza Virus 4 NOT DETECTED NOT DETECTED Final   Respiratory Syncytial Virus NOT DETECTED NOT DETECTED Final   Bordetella pertussis NOT DETECTED NOT DETECTED Final   Bordetella Parapertussis NOT DETECTED NOT  DETECTED Final   Chlamydophila pneumoniae NOT DETECTED NOT DETECTED Final   Mycoplasma pneumoniae NOT DETECTED NOT DETECTED Final    Comment: Performed at [facility], [address], [place], [place] [address]    Procedures/Studies: CT MAXILLOFACIAL W CONTRAST  Result Date: 12/09/2022 CLINICAL DATA:  Follow-up fungal sinusitis. EXAM: CT MAXILLOFACIAL WITH CONTRAST TECHNIQUE: Multidetector CT imaging of the maxillofacial structures was performed with intravenous contrast. Multiplanar CT image reconstructions were also generated. RADIATION DOSE REDUCTION: This exam was performed according to the departmental dose-optimization program which includes automated exposure control, adjustment of the mA and/or kV according to patient size and/or use of iterative reconstruction technique. CONTRAST:  75mL OMNIPAQUE IOHEXOL 300 MG/ML  SOLN COMPARISON:  Maxillofacial CT 09/03/2022 FINDINGS: Osseous: No acute fracture or new bone lesion. Orbits: Bilateral cataract extraction. Mild extraconal soft tissue thickening inferolaterally in the left orbit along the area bone destruction,  decreased from prior. Sinuses: Unchanged bone destruction involving the posterior and inferior walls of the left maxillary sinus as well as mild involvement of the left pterygoid process. Circumferential soft tissue in the left maxillary sinus on the prior CT has greatly improved, with residual nodular soft tissue present in the inferior and posterior aspects of the sinus currently. Abnormal soft tissue extending from the sinus into the masticator space, infratemporal fossa, and pterygoid palatine fossa has overall mildly improved. A left maxillary antrostomy is widely patent. There is minimal scattered mucosal thickening in the other paranasal sinuses, and there is rightward nasal septal deviation. Soft tissues: As above. Limited intracranial: No acute finding. IMPRESSION: Greatly decreased soft tissue in the left maxillary sinus. Unchanged associated bone destruction and mildly improved soft tissue infiltrating the regional deep spaces as above. No new or progressive finding. Electronically Signed   By: Sebastian Ache M.D.   On: 12/09/2022 17:05   DG Chest Port 1 View  Result Date: 12/08/2022 CLINICAL DATA:  Shortness of breath EXAM: PORTABLE CHEST 1 VIEW COMPARISON:  Chest x-ray 11/27/2022 FINDINGS: There is stable elevation of the right hemidiaphragm with blunting of the right costophrenic angle, likely scarring. Cardiomediastinal silhouette is stable. Surgical clips overlie the right hilum. Left lung is clear. No pneumothorax. No acute fractures are seen. IMPRESSION: No acute findings. Electronically Signed   By: Darliss Cheney M.D.   On: 12/08/2022 15:21   DG Pelvis Portable  Result Date: 11/27/2022 CLINICAL DATA:  Fall, right side pain EXAM: PORTABLE PELVIS 1-2 VIEWS COMPARISON:  None Available. FINDINGS: No acute bony abnormality. Specifically, no fracture, subluxation, or dislocation. Hip joints and SI joints symmetric. Degenerative changes in the visualized lower lumbar spine. IMPRESSION: No acute  bony abnormality. Electronically Signed   By: Charlett Nose M.D.   On: 11/27/2022 23:17   DG Chest Port 1 View  Result Date: 11/27/2022 CLINICAL DATA:  Trauma EXAM: PORTABLE CHEST 1 VIEW COMPARISON:  07/13/2022 FINDINGS: Postsurgical changes of the right thorax with chronic scarring and volume loss. Mild cardiomegaly. No acute airspace disease or pneumothorax. IMPRESSION: No active disease. Postsurgical changes of the right thorax. Electronically Signed   By: Jasmine Pang M.D.   On: 11/27/2022 23:16   CT CERVICAL SPINE WO CONTRAST  Result Date: 11/27/2022 CLINICAL DATA:  Larey Seat face forward and bent over trying to her grandson. Daughter witnessed fall. Hit forehead with multiple skin tears. Patient is on Eliquis. EXAM: CT CERVICAL SPINE WITHOUT CONTRAST TECHNIQUE: Multidetector CT imaging of the cervical spine was performed without intravenous contrast. Multiplanar CT image reconstructions were also  generated. RADIATION DOSE REDUCTION: This exam was performed according to the departmental dose-optimization program which includes automated exposure control, adjustment of the mA and/or kV according to patient size and/or use of iterative reconstruction technique. COMPARISON:  None Available. FINDINGS: Alignment: No evidence of traumatic malalignment. Mild anterolisthesis of C5 chronic. Skull base and vertebrae: No acute fracture. No primary bone lesion or focal pathologic process. Soft tissues and spinal canal: No prevertebral fluid or swelling. No visible canal hematoma. Disc levels: Mild age related spondylosis and facet arthropathy. No severe spinal canal Upper chest: No acute abnormality. Other: Carotid calcification. IMPRESSION: No cervical spine fracture. Electronically Signed   By: Minerva Fester M.D.   On: 11/27/2022 23:12   CT HEAD WO CONTRAST  Result Date: 11/27/2022 CLINICAL DATA:  Trauma EXAM: CT HEAD WITHOUT CONTRAST TECHNIQUE: Contiguous axial images were obtained from the base of the skull  through the vertex without intravenous contrast. RADIATION DOSE REDUCTION: This exam was performed according to the departmental dose-optimization program which includes automated exposure control, adjustment of the mA and/or kV according to patient size and/or use of iterative reconstruction technique. COMPARISON:  Maxillofacial CT 09/03/2022.  CT of the head 07/13/2022 FINDINGS: Brain: No evidence of acute infarction, hemorrhage, hydrocephalus, extra-axial collection or mass lesion/mass effect. Again seen is mild diffuse atrophy. Vascular: Atherosclerotic calcifications are present within the cavernous internal carotid arteries. Skull: Normal. Negative for fracture or focal lesion. Sinuses/Orbits: Osseous destruction of the left maxillary sinus wall partially imaged. No air-fluid levels are seen. Otherwise, paranasal sinuses, mastoid air cells and orbits are within normal limits. Other: None. IMPRESSION: No acute intracranial process. Electronically Signed   By: Darliss Cheney M.D.   On: 11/27/2022 23:10     Time coordinating discharge: Over 30 minutes    Alex Chamber, MD  [facility] 12/11/2022, 4:53 PM

## 2022-12-11 NOTE — Progress Notes (Signed)
Mobility Specialist - Progress Note   12/11/22 0957  Mobility  Activity Ambulated with assistance in hallway  Level of Assistance Modified independent, requires aide device or extra time  Assistive Device Front wheel walker  Distance Ambulated (ft) 250 ft  Activity Response Tolerated well  $Mobility charge 1 Mobility  Mobility Specialist Start Time (ACUTE ONLY) 0950  Mobility Specialist Stop Time (ACUTE ONLY) 0957  Mobility Specialist Time Calculation (min) (ACUTE ONLY) 7 min   Pt received in bed and agreeable to mobility. No complaints during session. Pt to EOB after session with all needs met.     [facility]

## 2022-12-11 NOTE — Plan of Care (Signed)
Id brief note  Reviewed chart Afebrile Bcx negative Mild leukocytosis/thrombocytosis likely reactive to cancer burden Relative AI on hydrocortisone 20/5   -ok to stop cefepime -ok to discharge from id standtpoint -discuss with primary team

## 2022-12-11 NOTE — Progress Notes (Signed)
AVS reviewed w/ pt & daughter- both verbalized an understanding. Discharge meds changed to [facility]  by Dr Zollie Beckers. PIV removed as documented. No other questions at this time. Pt to home w/ daughter

## 2022-12-13 LAB — CULTURE, BLOOD (ROUTINE X 2)
Culture: NO GROWTH
Culture: NO GROWTH
Special Requests: ADEQUATE
Special Requests: ADEQUATE

## 2022-12-14 ENCOUNTER — Other Ambulatory Visit (HOSPITAL_COMMUNITY): Payer: Self-pay

## 2022-12-15 ENCOUNTER — Inpatient Hospital Stay: Payer: Medicare HMO

## 2022-12-15 ENCOUNTER — Other Ambulatory Visit: Payer: Self-pay | Admitting: Physician Assistant

## 2022-12-15 ENCOUNTER — Telehealth: Payer: Self-pay

## 2022-12-15 VITALS — BP 135/70 | HR 92 | Temp 97.3°F | Resp 20

## 2022-12-15 DIAGNOSIS — R531 Weakness: Secondary | ICD-10-CM

## 2022-12-15 DIAGNOSIS — D509 Iron deficiency anemia, unspecified: Secondary | ICD-10-CM

## 2022-12-15 DIAGNOSIS — E876 Hypokalemia: Secondary | ICD-10-CM

## 2022-12-15 DIAGNOSIS — D649 Anemia, unspecified: Secondary | ICD-10-CM | POA: Diagnosis present

## 2022-12-15 DIAGNOSIS — D638 Anemia in other chronic diseases classified elsewhere: Secondary | ICD-10-CM

## 2022-12-15 LAB — CBC WITH DIFFERENTIAL (CANCER CENTER ONLY)
Abs Immature Granulocytes: 0.16 10*3/uL — ABNORMAL HIGH (ref 0.00–0.07)
Basophils Absolute: 0 10*3/uL (ref 0.0–0.1)
Basophils Relative: 0 %
Eosinophils Absolute: 0.1 10*3/uL (ref 0.0–0.5)
Eosinophils Relative: 0 %
HCT: 27.4 % — ABNORMAL LOW (ref 39.0–52.0)
Hemoglobin: 7.8 g/dL — ABNORMAL LOW (ref 13.0–17.0)
Immature Granulocytes: 1 %
Lymphocytes Relative: 8 %
Lymphs Abs: 1.2 10*3/uL (ref 0.7–4.0)
MCH: 22.9 pg — ABNORMAL LOW (ref 26.0–34.0)
MCHC: 28.5 g/dL — ABNORMAL LOW (ref 30.0–36.0)
MCV: 80.4 fL (ref 80.0–100.0)
Monocytes Absolute: 1.2 10*3/uL — ABNORMAL HIGH (ref 0.1–1.0)
Monocytes Relative: 8 %
Neutro Abs: 11.7 10*3/uL — ABNORMAL HIGH (ref 1.7–7.7)
Neutrophils Relative %: 83 %
Platelet Count: 434 10*3/uL — ABNORMAL HIGH (ref 150–400)
RBC: 3.41 MIL/uL — ABNORMAL LOW (ref 4.22–5.81)
RDW: 19.9 % — ABNORMAL HIGH (ref 11.5–15.5)
WBC Count: 14.3 10*3/uL — ABNORMAL HIGH (ref 4.0–10.5)
nRBC: 0 % (ref 0.0–0.2)

## 2022-12-15 LAB — CMP (CANCER CENTER ONLY)
ALT: 8 U/L (ref 0–44)
AST: 9 U/L — ABNORMAL LOW (ref 15–41)
Albumin: 3.1 g/dL — ABNORMAL LOW (ref 3.5–5.0)
Alkaline Phosphatase: 151 U/L — ABNORMAL HIGH (ref 38–126)
Anion gap: 9 (ref 5–15)
BUN: 13 mg/dL (ref 8–23)
CO2: 32 mmol/L (ref 22–32)
Calcium: 8.4 mg/dL — ABNORMAL LOW (ref 8.9–10.3)
Chloride: 97 mmol/L — ABNORMAL LOW (ref 98–111)
Creatinine: 0.89 mg/dL (ref 0.61–1.24)
GFR, Estimated: >60 mL/min (ref >60)
Glucose, Bld: 192 mg/dL — ABNORMAL HIGH (ref 70–99)
Potassium: 3.6 mmol/L (ref 3.5–5.1)
Sodium: 138 mmol/L (ref 135–145)
Total Bilirubin: 0.4 mg/dL (ref 0.3–1.2)
Total Protein: 6.2 g/dL — ABNORMAL LOW (ref 6.5–8.1)

## 2022-12-15 MED ORDER — LORATADINE 10 MG PO TABS
10.0000 mg | ORAL_TABLET | Freq: Once | ORAL | Status: AC
  Administered 2022-12-15: 10 mg via ORAL
  Filled 2022-12-15: qty 1

## 2022-12-15 MED ORDER — ACETAMINOPHEN 325 MG PO TABS
650.0000 mg | ORAL_TABLET | Freq: Once | ORAL | Status: AC
  Administered 2022-12-15: 650 mg via ORAL
  Filled 2022-12-15: qty 2

## 2022-12-15 MED ORDER — SODIUM CHLORIDE 0.9 % IV SOLN
300.0000 mg | Freq: Once | INTRAVENOUS | Status: AC
  Administered 2022-12-15: 300 mg via INTRAVENOUS
  Filled 2022-12-15: qty 300

## 2022-12-15 MED ORDER — SODIUM CHLORIDE 0.9 % IV SOLN
INTRAVENOUS | Status: DC
Start: 2022-12-15 — End: 2022-12-15

## 2022-12-15 NOTE — Telephone Encounter (Signed)
Called and spoke with pts daughter [name] about appt for Friday 11/1 to get labs at 815 and blood transfusion at 900. She confirmed appt.

## 2022-12-15 NOTE — Patient Instructions (Signed)
Iron Sucrose Injection What is this medication? IRON SUCROSE (EYE ern SOO krose) treats low levels of iron (iron deficiency anemia) in people with kidney disease. Iron is a mineral that plays an important role in making red blood cells, which carry oxygen from your lungs to the rest of your body. This medicine may be used for other purposes; ask your health care provider or pharmacist if you have questions. COMMON BRAND NAME(S): Venofer What should I tell my care team before I take this medication? They need to know if you have any of these conditions: Anemia not caused by low iron levels Heart disease High levels of iron in the blood Kidney disease Liver disease An unusual or allergic reaction to iron, other medications, foods, dyes, or preservatives Pregnant or trying to get pregnant Breastfeeding How should I use this medication? This medication is for infusion into a vein. It is given in a hospital or clinic setting. Talk to your care team about the use of this medication in children. While this medication may be prescribed for children as young as 2 years for selected conditions, precautions do apply. Overdosage: If you think you have taken too much of this medicine contact a poison control center or emergency room at once. NOTE: This medicine is only for you. Do not share this medicine with others. What if I miss a dose? Keep appointments for follow-up doses. It is important not to miss your dose. Call your care team if you are unable to keep an appointment. What may interact with this medication? Do not take this medication with any of the following: Deferoxamine Dimercaprol Other iron products This medication may also interact with the following: Chloramphenicol Deferasirox This list may not describe all possible interactions. Give your health care provider a list of all the medicines, herbs, non-prescription drugs, or dietary supplements you use. Also tell them if you smoke,  drink alcohol, or use illegal drugs. Some items may interact with your medicine. What should I watch for while using this medication? Visit your care team regularly. Tell your care team if your symptoms do not start to get better or if they get worse. You may need blood work done while you are taking this medication. You may need to follow a special diet. Talk to your care team. Foods that contain iron include: whole grains/cereals, dried fruits, beans, or peas, leafy green vegetables, and organ meats (liver, kidney). What side effects may I notice from receiving this medication? Side effects that you should report to your care team as soon as possible: Allergic reactions--skin rash, itching, hives, swelling of the face, lips, tongue, or throat Low blood pressure--dizziness, feeling faint or lightheaded, blurry vision Shortness of breath Side effects that usually do not require medical attention (report to your care team if they continue or are bothersome): Flushing Headache Joint pain Muscle pain Nausea Pain, redness, or irritation at injection site This list may not describe all possible side effects. Call your doctor for medical advice about side effects. You may report side effects to FDA at 1-800-FDA-1088. Where should I keep my medication? This medication is given in a hospital or clinic. It will not be stored at home. NOTE: This sheet is a summary. It may not cover all possible information. If you have questions about this medicine, talk to your doctor, pharmacist, or health care provider.  2024 Elsevier/Gold Standard (2022-07-10 00:00:00)

## 2022-12-16 NOTE — Telephone Encounter (Signed)
Oral Chemotherapy Pharmacist Encounter   Spoke with patient' daughter, Theora Gianotti, today to follow up regarding patient's oral chemotherapy medication: Welireg ([email or web address])  Notified by [facility] that medication is supposed to deliver to patient's home today (12/16/22). I spoke with Cassie Heilingoetter, PA-C and confirmed patient is to hold off starting medication at this time (patient Hgb was 7.8 as of 12/15/22).  Patient's daughter knows to store the [facility] in safe area in home and not start until instructed by MD office.  Lenord Carbo, PharmD, BCPS, BCOP Hematology/Oncology Clinical Pharmacist [facility] Oral Chemotherapy Navigation Clinics [phone] 12/16/2022 10:46 AM

## 2022-12-17 ENCOUNTER — Telehealth: Payer: Self-pay

## 2022-12-17 NOTE — Telephone Encounter (Signed)
Pt's daughter called and stated that pt is retaining fluid in legs and feet. Informed me that he is coming for blood tomorrow and asked if he could be seen.  Spoke with [place], [place] in [facility] and she can see pt while receiving blood tomorrow.  Appt is made. Educated daughter to keep pt's legs elevated as much as possible. Informed daughter to call with any questions or concerns.

## 2022-12-18 ENCOUNTER — Other Ambulatory Visit: Payer: Self-pay | Admitting: *Deleted

## 2022-12-18 ENCOUNTER — Inpatient Hospital Stay: Payer: Medicare HMO | Admitting: Physician Assistant

## 2022-12-18 ENCOUNTER — Encounter: Payer: Self-pay | Admitting: Physician Assistant

## 2022-12-18 ENCOUNTER — Inpatient Hospital Stay: Payer: Medicare HMO

## 2022-12-18 ENCOUNTER — Inpatient Hospital Stay: Payer: Medicare HMO | Attending: Oncology

## 2022-12-18 ENCOUNTER — Other Ambulatory Visit (HOSPITAL_COMMUNITY): Payer: Self-pay

## 2022-12-18 VITALS — BP 141/69 | HR 89 | Temp 99.4°F | Resp 18

## 2022-12-18 DIAGNOSIS — C786 Secondary malignant neoplasm of retroperitoneum and peritoneum: Secondary | ICD-10-CM | POA: Diagnosis not present

## 2022-12-18 DIAGNOSIS — R601 Generalized edema: Secondary | ICD-10-CM

## 2022-12-18 DIAGNOSIS — C641 Malignant neoplasm of right kidney, except renal pelvis: Secondary | ICD-10-CM

## 2022-12-18 DIAGNOSIS — C787 Secondary malignant neoplasm of liver and intrahepatic bile duct: Secondary | ICD-10-CM

## 2022-12-18 DIAGNOSIS — E876 Hypokalemia: Secondary | ICD-10-CM | POA: Insufficient documentation

## 2022-12-18 DIAGNOSIS — Z87891 Personal history of nicotine dependence: Secondary | ICD-10-CM

## 2022-12-18 DIAGNOSIS — I119 Hypertensive heart disease without heart failure: Secondary | ICD-10-CM | POA: Diagnosis not present

## 2022-12-18 DIAGNOSIS — W19XXXA Unspecified fall, initial encounter: Secondary | ICD-10-CM

## 2022-12-18 DIAGNOSIS — R296 Repeated falls: Secondary | ICD-10-CM | POA: Diagnosis not present

## 2022-12-18 DIAGNOSIS — C649 Malignant neoplasm of unspecified kidney, except renal pelvis: Secondary | ICD-10-CM | POA: Insufficient documentation

## 2022-12-18 DIAGNOSIS — Z9181 History of falling: Secondary | ICD-10-CM | POA: Diagnosis not present

## 2022-12-18 DIAGNOSIS — Z905 Acquired absence of kidney: Secondary | ICD-10-CM | POA: Insufficient documentation

## 2022-12-18 DIAGNOSIS — D649 Anemia, unspecified: Secondary | ICD-10-CM

## 2022-12-18 DIAGNOSIS — R600 Localized edema: Secondary | ICD-10-CM | POA: Insufficient documentation

## 2022-12-18 DIAGNOSIS — R634 Abnormal weight loss: Secondary | ICD-10-CM | POA: Insufficient documentation

## 2022-12-18 LAB — CBC WITH DIFFERENTIAL (CANCER CENTER ONLY)
Abs Immature Granulocytes: 0.21 10*3/uL — ABNORMAL HIGH (ref 0.00–0.07)
Basophils Absolute: 0 10*3/uL (ref 0.0–0.1)
Basophils Relative: 0 %
Eosinophils Absolute: 0.1 10*3/uL (ref 0.0–0.5)
Eosinophils Relative: 1 %
HCT: 25.6 % — ABNORMAL LOW (ref 39.0–52.0)
Hemoglobin: 7.5 g/dL — ABNORMAL LOW (ref 13.0–17.0)
Immature Granulocytes: 1 %
Lymphocytes Relative: 9 %
Lymphs Abs: 1.3 10*3/uL (ref 0.7–4.0)
MCH: 23.9 pg — ABNORMAL LOW (ref 26.0–34.0)
MCHC: 29.3 g/dL — ABNORMAL LOW (ref 30.0–36.0)
MCV: 81.5 fL (ref 80.0–100.0)
Monocytes Absolute: 1.1 10*3/uL — ABNORMAL HIGH (ref 0.1–1.0)
Monocytes Relative: 7 %
Neutro Abs: 12.6 10*3/uL — ABNORMAL HIGH (ref 1.7–7.7)
Neutrophils Relative %: 82 %
Platelet Count: 494 10*3/uL — ABNORMAL HIGH (ref 150–400)
RBC: 3.14 MIL/uL — ABNORMAL LOW (ref 4.22–5.81)
RDW: 20 % — ABNORMAL HIGH (ref 11.5–15.5)
WBC Count: 15.3 10*3/uL — ABNORMAL HIGH (ref 4.0–10.5)
nRBC: 0 % (ref 0.0–0.2)

## 2022-12-18 LAB — CMP (CANCER CENTER ONLY)
ALT: 8 U/L (ref 0–44)
AST: 8 U/L — ABNORMAL LOW (ref 15–41)
Albumin: 2.8 g/dL — ABNORMAL LOW (ref 3.5–5.0)
Alkaline Phosphatase: 140 U/L — ABNORMAL HIGH (ref 38–126)
Anion gap: 8 (ref 5–15)
BUN: 10 mg/dL (ref 8–23)
CO2: 32 mmol/L (ref 22–32)
Calcium: 8.3 mg/dL — ABNORMAL LOW (ref 8.9–10.3)
Chloride: 98 mmol/L (ref 98–111)
Creatinine: 0.72 mg/dL (ref 0.61–1.24)
GFR, Estimated: >60 mL/min (ref >60)
Glucose, Bld: 205 mg/dL — ABNORMAL HIGH (ref 70–99)
Potassium: 3 mmol/L — ABNORMAL LOW (ref 3.5–5.1)
Sodium: 138 mmol/L (ref 135–145)
Total Bilirubin: 0.5 mg/dL (ref 0.3–1.2)
Total Protein: 6.2 g/dL — ABNORMAL LOW (ref 6.5–8.1)

## 2022-12-18 LAB — BRAIN NATRIURETIC PEPTIDE: B Natriuretic Peptide: 841.8 pg/mL — ABNORMAL HIGH (ref 0.0–100.0)

## 2022-12-18 LAB — PREPARE RBC (CROSSMATCH)

## 2022-12-18 MED ORDER — FUROSEMIDE 20 MG PO TABS
20.0000 mg | ORAL_TABLET | Freq: Every day | ORAL | 0 refills | Status: DC
  Filled 2022-12-18: qty 5, 5d supply, fill #0

## 2022-12-18 MED ORDER — ACETAMINOPHEN 325 MG PO TABS
650.0000 mg | ORAL_TABLET | Freq: Once | ORAL | Status: AC
  Administered 2022-12-18: 650 mg via ORAL
  Filled 2022-12-18: qty 2

## 2022-12-18 NOTE — Telephone Encounter (Signed)
Per in basket notes scheduled daughter [name]) schedule patient IV for 11/9 @1030 

## 2022-12-18 NOTE — Patient Instructions (Signed)

## 2022-12-18 NOTE — Progress Notes (Deleted)
[facility] OFFICE PROGRESS NOTE  [name], MD [address] [place] [address]  DIAGNOSIS: Stage IV clear-cell renal cell carcinoma diagnosed initially in 2016 as localized T1 a disease with evidence of metastatic disease to the liver and omentum in 2019.   PRIOR THERAPY: 1) status post radical right renal nephrectomy under the care of Dr. Marlou Porch on February 01, 2015 and the final pathology revealed 3.3 cm clear renal cell carcinoma with Fuhrman grade 3. 2) status post omental biopsy in July 2019 that confirmed the presence of recurrent metastatic renal cell carcinoma. 3) status post treatment with immunotherapy with ipilimumab 1 Mg/KG and nivolumab 3 mg/KG every 3 weeks started 09/21/2017 status post 4 cycles and the patient did not receive any additional immunotherapy secondary to adrenal insufficiency and panhypopituitarism 4) status post radiotherapy to the abdominal lymph node completed November 08, 2019. 5) Cabometyx initially started at 40 mg p.o. daily in March 2023 and reduced to 20 mg p.o. daily in April 2023.  This was discontinued in June 2024 secondary to disease progression. 6) Axitinib 2 mg p.o. twice daily.  First dose started 07/29/2022.  This was discontinued on November 12, 2022 secondary to disease progression.  CURRENT THERAPY: 120 mg p.o. daily of belzutifan, starting in the next few days. ***  INTERVAL HISTORY: [name] 77 y.o. male returns to the clinic today for a follow up visit accompanied by his daughter.    , When the patient was seen by Dr. Arbutus Ped on 11/12/2022 he had a restaging CT scan that showed disease progression..  Then the patient saw Dr. Clarene Duke at [facility] for recommendations who recommended  belzutifan 120 mg daily. the patient was last seen in clinic on 12/08/2022 to discuss initiating treatment.  The patient has been having progressively worsening fatigue, low grade fever, weakness, anemia, and falls.  He also struggles  with hypokalemia.  At his last appointment he also had some worsening generalized swelling.  Due to his hypokalemia, fatigue, weakness, failure to thrive, and swelling he was subsequently sent to the emergency room.  He was admitted until 12/11/2022 and he was treated for SIRS.  He also iron studies such had iron deficiency anemia and anemia received IV iron.  He is scheduled to receive his second dose this week.  For his poor p.o. intake, he drinks Ensure 1-2 times per day.  He also takes Megace for his poor appetite.  He was previously followed by palliative care for pain management for which she is on oxycodone and Valium.  He no longer takes this and he only takes Tylenol for the pain.  He never started his  belzutifan 120 mg due to poor performance status and persistent anemia on labs.  He received a blood transufion on 12/18/22. He is seeing a symptom management clinic on Friday with lower extremity swelling and worsening performance status for which she was given a short course of Lasix.  Since last being seen he continues to feel ***.  He denies any fever, chills, or night sweats.  He denies any changes in his breathing.  Denies any nausea, vomiting, diarrhea, or constipation.  Denies any headache or visual changes.  He is here today for evaluation and more detailed discussion about his current condition.  MEDICAL HISTORY: Past Medical History:  Diagnosis Date   Arthritis    Chronic kidney disease    only has one kidney    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  02/01/2015   Coronary artery disease    followed by Dr.Tilley   Frequent PVCs    GERD (gastroesophageal reflux disease)    Heart murmur    never has caused any problems   Hx of cancer of lung 1980's   Hyperlipidemia    Hypertension    Hypothyroidism    Incisional hernia 08/01/2015   Lung cancer (HCC) 1993   Lung metastases 2019   Pneumonia    x several   Recurrent umbilical hernia 08/01/2015     ALLERGIES:  is allergic to diphenhydramine.  MEDICATIONS:  Current Outpatient Medications  Medication Sig Dispense Refill   acetaminophen (TYLENOL) 500 MG tablet Take 1,000 mg by mouth 2 (two) times daily.     apixaban (ELIQUIS) 2.5 MG TABS tablet Take 1 tablet (2.5 mg total) by mouth 2 (two) times daily. 180 tablet 1   belzutifan (WELIREG) 40 MG tablet Take 3 tablets (120 mg total) by mouth daily. (Patient not taking: Reported on 12/08/2022) 90 tablet 3   diazepam (VALIUM) 2 MG tablet TAKE 1-2 TABLETS BY MOUTH EVERY 8 HOURS AS NEEDED FOR ANXIETY (Patient taking differently: Take 2 mg by mouth every 8 (eight) hours as needed for anxiety.) 60 tablet 0   diltiazem (CARDIZEM CD) 240 MG 24 hr capsule Take 1 capsule (240 mg total) by mouth in the morning. 90 capsule 3   ezetimibe (ZETIA) 10 MG tablet Take 1 tablet (10 mg total) by mouth daily. (Patient not taking: Reported on 12/08/2022) 90 tablet 3   famotidine (PEPCID) 20 MG tablet Take 20 mg by mouth 2 (two) times daily as needed.     fluticasone (FLONASE) 50 MCG/ACT nasal spray Place 2 sprays into both nostrils daily as needed for allergies.     furosemide (LASIX) 20 MG tablet Take 1 tablet (20 mg total) by mouth daily with breakfast for 5 days. 5 tablet 0   hydrocortisone (CORTEF) 5 MG tablet Take by mouth 15 mg in am and 5 mg in pm (Patient taking differently: Take 25 mg by mouth daily. Take by mouth 20 mg in am and 5 mg in pm) 400 tablet 3   levothyroxine (SYNTHROID) 150 MCG tablet Take 1 tablet (150 mcg total) by mouth daily before breakfast. 90 tablet 3   loratadine (CLARITIN) 10 MG tablet Take 10 mg by mouth daily as needed for allergies.     megestrol (MEGACE) 20 MG tablet TAKE 1 TABLET BY MOUTH DAILY 30 tablet 1   Multiple Vitamins-Minerals (CENTRUM SILVER 50+MEN) TABS Take 1 tablet by mouth daily with breakfast.     mupirocin ointment (BACTROBAN) 2 % Place 1 Application into the nose daily as needed (sores).     posaconazole  (NOXAFIL) 100 MG TBEC delayed-release tablet Take 3 tablets (300 mg total) by mouth daily. 42 tablet 2   potassium chloride 20 MEQ/15ML (10%) SOLN Take 30 mLs (40 mEq total) by mouth daily for 4 days. 120 mL 0   traZODone (DESYREL) 150 MG tablet Take 150 mg by mouth at bedtime as needed for sleep.     VITAMIN D PO Take 1 capsule by mouth daily.     No current facility-administered medications for this visit.    SURGICAL HISTORY:  Past Surgical History:  Procedure Laterality Date   APPENDECTOMY  age 29   ARTERY BIOPSY Left 04/10/2022   Procedure: LEFT TEMPORAL ARTERY BIOPSY;  Surgeon: Chuck Hint, MD;  Location: [facility] OR;  Service: Vascular;  Laterality: Left;   CHOLECYSTECTOMY  10/18/1978   EYE SURGERY Bilateral 2019   cataract   LAPAROSCOPIC LYSIS OF ADHESIONS N/A 08/01/2015   Procedure: LAPAROSCOPIC LYSIS OF ADHESIONS;  Surgeon: Karie Soda, MD;  Location: [facility] ORS;  Service: General;  Laterality: N/A;   LUNG LOBECTOMY  02/16/1990   lung cancer- patient has staples in lung not to have MRI per patient    MAXILLARY ANTROSTOMY Left 05/22/2022   Procedure: MAXILLARY ANTROSTOMY;  Surgeon: Laren Boom, DO;  Location: [facility] OR;  Service: ENT;  Laterality: Left;   PILONIDAL CYST EXCISION  02/16/2009   Dr Gerrit Friends   ROBOTIC ASSITED PARTIAL NEPHRECTOMY Right 02/01/2015   Procedure: RIGHT ROBOTIC ASSISTED LAPAROSOCOPY NEPHRECTOMY;  Surgeon: Crist Fat, MD;  Location: [facility] ORS;  Service: Urology;  Laterality: Right;   SINUS ENDO WITH FUSION Left 05/22/2022   Procedure: SINUS ENDO WITH FUSION;  Surgeon: Laren Boom, DO;  Location: [facility] OR;  Service: ENT;  Laterality: Left;   VENTRAL HERNIA REPAIR N/A 08/01/2015   Procedure: LAPAROSCOPIC VENTRAL WALL HERNIA WITH MESH;  Surgeon: Karie Soda, MD;  Location: [facility] ORS;  Service: General;  Laterality: N/A;    REVIEW OF SYSTEMS:   Review of Systems  Constitutional: Negative for appetite change, chills, fatigue, fever and unexpected  weight change.  HENT:   Negative for mouth sores, nosebleeds, sore throat and trouble swallowing.   Eyes: Negative for eye problems and icterus.  Respiratory: Negative for cough, hemoptysis, shortness of breath and wheezing.   Cardiovascular: Negative for chest pain and leg swelling.  Gastrointestinal: Negative for abdominal pain, constipation, diarrhea, nausea and vomiting.  Genitourinary: Negative for bladder incontinence, difficulty urinating, dysuria, frequency and hematuria.   Musculoskeletal: Negative for back pain, gait problem, neck pain and neck stiffness.  Skin: Negative for itching and rash.  Neurological: Negative for dizziness, extremity weakness, gait problem, headaches, light-headedness and seizures.  Hematological: Negative for adenopathy. Does not bruise/bleed easily.  Psychiatric/Behavioral: Negative for confusion, depression and sleep disturbance. The patient is not nervous/anxious.     PHYSICAL EXAMINATION:  There were no vitals taken for this visit.  ECOG PERFORMANCE STATUS: {CHL ONC ECOG [id number]  Physical Exam  Constitutional: Oriented to person, place, and time and well-developed, well-nourished, and in no distress. No distress.  HENT:  Head: Normocephalic and atraumatic.  Mouth/Throat: Oropharynx is clear and moist. No oropharyngeal exudate.  Eyes: Conjunctivae are normal. Right eye exhibits no discharge. Left eye exhibits no discharge. No scleral icterus.  Neck: Normal range of motion. Neck supple.  Cardiovascular: Normal rate, regular rhythm, normal heart sounds and intact distal pulses.   Pulmonary/Chest: Effort normal and breath sounds normal. No respiratory distress. No wheezes. No rales.  Abdominal: Soft. Bowel sounds are normal. Exhibits no distension and no mass. There is no tenderness.  Musculoskeletal: Normal range of motion. Exhibits no edema.  Lymphadenopathy:    No cervical adenopathy.  Neurological: Alert and oriented to person, place, and  time. Exhibits normal muscle tone. Gait normal. Coordination normal.  Skin: Skin is warm and dry. No rash noted. Not diaphoretic. No erythema. No pallor.  Psychiatric: Mood, memory and judgment normal.  Vitals reviewed.  LABORATORY DATA: Lab Results  Component Value Date   WBC 15.3 (H) 12/18/2022   HGB 7.5 (L) 12/18/2022   HCT 25.6 (L) 12/18/2022   MCV 81.5 12/18/2022   PLT 494 (H) 12/18/2022      Chemistry      Component Value Date/Time   NA 138 12/18/2022 0901   NA 141 08/11/2021  1252   K 3.0 (L) 12/18/2022 0901   CL 98 12/18/2022 0901   CO2 32 12/18/2022 0901   BUN 10 12/18/2022 0901   BUN 14 08/11/2021 1252   CREATININE 0.72 12/18/2022 0901   CREATININE 0.97 10/12/2022 1022      Component Value Date/Time   CALCIUM 8.3 (L) 12/18/2022 0901   ALKPHOS 140 (H) 12/18/2022 0901   AST 8 (L) 12/18/2022 0901   ALT 8 12/18/2022 0901   BILITOT 0.5 12/18/2022 0901       RADIOGRAPHIC STUDIES:  CT MAXILLOFACIAL W CONTRAST  Result Date: 12/09/2022 CLINICAL DATA:  Follow-up fungal sinusitis. EXAM: CT MAXILLOFACIAL WITH CONTRAST TECHNIQUE: Multidetector CT imaging of the maxillofacial structures was performed with intravenous contrast. Multiplanar CT image reconstructions were also generated. RADIATION DOSE REDUCTION: This exam was performed according to the departmental dose-optimization program which includes automated exposure control, adjustment of the mA and/or kV according to patient size and/or use of iterative reconstruction technique. CONTRAST:  75mL OMNIPAQUE IOHEXOL 300 MG/ML  SOLN COMPARISON:  Maxillofacial CT 09/03/2022 FINDINGS: Osseous: No acute fracture or new bone lesion. Orbits: Bilateral cataract extraction. Mild extraconal soft tissue thickening inferolaterally in the left orbit along the area bone destruction, decreased from prior. Sinuses: Unchanged bone destruction involving the posterior and inferior walls of the left maxillary sinus as well as mild involvement of  the left pterygoid process. Circumferential soft tissue in the left maxillary sinus on the prior CT has greatly improved, with residual nodular soft tissue present in the inferior and posterior aspects of the sinus currently. Abnormal soft tissue extending from the sinus into the masticator space, infratemporal fossa, and pterygoid palatine fossa has overall mildly improved. A left maxillary antrostomy is widely patent. There is minimal scattered mucosal thickening in the other paranasal sinuses, and there is rightward nasal septal deviation. Soft tissues: As above. Limited intracranial: No acute finding. IMPRESSION: Greatly decreased soft tissue in the left maxillary sinus. Unchanged associated bone destruction and mildly improved soft tissue infiltrating the regional deep spaces as above. No new or progressive finding. Electronically Signed   By: Sebastian Ache M.D.   On: 12/09/2022 17:05   DG Chest Port 1 View  Result Date: 12/08/2022 CLINICAL DATA:  Shortness of breath EXAM: PORTABLE CHEST 1 VIEW COMPARISON:  Chest x-ray 11/27/2022 FINDINGS: There is stable elevation of the right hemidiaphragm with blunting of the right costophrenic angle, likely scarring. Cardiomediastinal silhouette is stable. Surgical clips overlie the right hilum. Left lung is clear. No pneumothorax. No acute fractures are seen. IMPRESSION: No acute findings. Electronically Signed   By: Darliss Cheney M.D.   On: 12/08/2022 15:21   DG Pelvis Portable  Result Date: 11/27/2022 CLINICAL DATA:  Fall, right side pain EXAM: PORTABLE PELVIS 1-2 VIEWS COMPARISON:  None Available. FINDINGS: No acute bony abnormality. Specifically, no fracture, subluxation, or dislocation. Hip joints and SI joints symmetric. Degenerative changes in the visualized lower lumbar spine. IMPRESSION: No acute bony abnormality. Electronically Signed   By: Charlett Nose M.D.   On: 11/27/2022 23:17   DG Chest Port 1 View  Result Date: 11/27/2022 CLINICAL DATA:   Trauma EXAM: PORTABLE CHEST 1 VIEW COMPARISON:  07/13/2022 FINDINGS: Postsurgical changes of the right thorax with chronic scarring and volume loss. Mild cardiomegaly. No acute airspace disease or pneumothorax. IMPRESSION: No active disease. Postsurgical changes of the right thorax. Electronically Signed   By: Jasmine Pang M.D.   On: 11/27/2022 23:16   CT CERVICAL SPINE WO CONTRAST  Result  Date: 11/27/2022 CLINICAL DATA:  Larey Seat face forward and bent over trying to her grandson. Daughter witnessed fall. Hit forehead with multiple skin tears. Patient is on Eliquis. EXAM: CT CERVICAL SPINE WITHOUT CONTRAST TECHNIQUE: Multidetector CT imaging of the cervical spine was performed without intravenous contrast. Multiplanar CT image reconstructions were also generated. RADIATION DOSE REDUCTION: This exam was performed according to the departmental dose-optimization program which includes automated exposure control, adjustment of the mA and/or kV according to patient size and/or use of iterative reconstruction technique. COMPARISON:  None Available. FINDINGS: Alignment: No evidence of traumatic malalignment. Mild anterolisthesis of C5 chronic. Skull base and vertebrae: No acute fracture. No primary bone lesion or focal pathologic process. Soft tissues and spinal canal: No prevertebral fluid or swelling. No visible canal hematoma. Disc levels: Mild age related spondylosis and facet arthropathy. No severe spinal canal Upper chest: No acute abnormality. Other: Carotid calcification. IMPRESSION: No cervical spine fracture. Electronically Signed   By: Minerva Perry M.D.   On: 11/27/2022 23:12   CT HEAD WO CONTRAST  Result Date: 11/27/2022 CLINICAL DATA:  Trauma EXAM: CT HEAD WITHOUT CONTRAST TECHNIQUE: Contiguous axial images were obtained from the base of the skull through the vertex without intravenous contrast. RADIATION DOSE REDUCTION: This exam was performed according to the departmental dose-optimization program  which includes automated exposure control, adjustment of the mA and/or kV according to patient size and/or use of iterative reconstruction technique. COMPARISON:  Maxillofacial CT 09/03/2022.  CT of the head 07/13/2022 FINDINGS: Brain: No evidence of acute infarction, hemorrhage, hydrocephalus, extra-axial collection or mass lesion/mass effect. Again seen is mild diffuse atrophy. Vascular: Atherosclerotic calcifications are present within the cavernous internal carotid arteries. Skull: Normal. Negative for fracture or focal lesion. Sinuses/Orbits: Osseous destruction of the left maxillary sinus wall partially imaged. No air-fluid levels are seen. Otherwise, paranasal sinuses, mastoid air cells and orbits are within normal limits. Other: None. IMPRESSION: No acute intracranial process. Electronically Signed   By: Darliss Cheney M.D.   On: 11/27/2022 23:10     ASSESSMENT/PLAN:  This is a very pleasant 77 year old Caucasian male with Stage IV clear-cell renal cell carcinoma diagnosed initially in 2016 as localized T1 a disease with evidence of metastatic disease to the liver and omentum in 2019. The patient underwent the following treatment:   1) status post radical right renal nephrectomy under the care of Dr. Marlou Porch on February 01, 2015 and the final pathology revealed 3.3 cm clear renal cell carcinoma with Fuhrman grade 3. 2) status post omental biopsy in July 2019 that confirmed the presence of recurrent metastatic renal cell carcinoma. 3) status post treatment with immunotherapy with ipilimumab 1 Mg/KG and nivolumab 3 mg/KG every 3 weeks started 09/21/2017 status post 4 cycles and the patient did not receive any additional immunotherapy secondary to adrenal insufficiency and panhypopituitarism 4) status post radiotherapy to the abdominal lymph node completed November 08, 2019.   He was on treatment with Cabometyx 20 mg p.o. daily started March 2023. This supposed to be on hold while he had sinus issues  and concern for fungal infection. He was on treatment with voriconazole by infectious disease.  He was supposed to be holding his treatment with Cabometyx but his daughter mentioned that the patient had been taking it regularly. This was disontinued in June 2024 due to disease progrsesion.    His CT scan showed abdomen including the pancreatic mass as well as peripancreatic and retroperitoneal lymphadenopathy as well as liver lesions.    Dr.  Mohamed recommended treatment with axitinib 2 mg BID. He started this on 07/29/22 Because of the drug to drug interaction with posaconazole, his dose is 2 mg p.o. twice daily. This was discontinued on 11/12/22 due to disease progression.    The patient saw Dr. Clarene Duke for further recommendations. Due to DDI with afinitor and his antifungal medication and concerns with tolerability with TKI with lenvatinib, she recommended 120 mg daily of belzutifan. Monitor CBC and CMP at least every 4 weeks, consider ESA to maintain Hbg, and repeat CT scan after 12 weeks due to the response of belzutifan being somewhat delayed.    The patient was seen with Dr. Arbutus Ped at his last appointment..   In order to start treatment with belzutifan, his hemoglobin has to be greater than 8.  He also has had worsening performance status. The patient was seen with Dr. Arbutus Ped. Dr. Arbutus Ped encouraged the patient to consider ***.   He is scheduled for his *** weekly dose of iron on ***.   Hbg *** Postassium ***  Swelling ***.   Blood.    Dr. Arbutus Ped would hold off on any ESA's at this time but we can revisit this at his next appointment.***   Potassium ***   Will see him back for follow-up visit in approximately *** weeks for repeat blood work and to manage any adverse side effects of his new treatment.***  F/u?  The patient was advised to call immediately if she has any concerning symptoms in the interval. The patient voices understanding of current disease status and treatment  options and is in agreement with the current care plan. All questions were answered. The patient knows to call the clinic with any problems, questions or concerns. We can certainly see the patient much sooner if necessary   No orders of the defined types were placed in this encounter.    I spent {CHL ONC TIME VISIT - [id number]} counseling the patient face to face. The total time spent in the appointment was {CHL ONC TIME VISIT - BJYNW:[id number]}.  [place] Alexa L Jamisha Hoeschen, PA-C 12/18/22

## 2022-12-18 NOTE — Progress Notes (Signed)
Symptom Management Consult Note [facility]    Patient Care Team: [name], MD as PCP - General (Family Medicine) Thurmon Fair, MD as PCP - Cardiology (Cardiology) Karie Soda, MD as Consulting Physician (General Surgery) Crist Fat, MD as Consulting Physician (Urology)    Name / MRN / DOB: [name]  [id number]  [birth year]   Date of visit: 12/18/2022   Chief Complaint/Reason for visit: swelling   ASSESSMENT & PLAN: Patient is a 77 y.o. male with oncologic history of  Stage IV clear-cell renal cell carcinoma diagnosed initially in 2016 as localized T1 a disease with evidence of metastatic disease to the liver and omentum followed by Dr. Arbutus Ped.  I have viewed most recent oncology note and lab work.    #Stage IV clear-cell renal cell carcinoma diagnosed initially in 2016 as localized T1 a disease with evidence of metastatic disease to the liver and omentum  - Next appointment with oncologist is 12/21/22   #Peripheral edema -New onset of generalized swelling in the extremities, including hands and legs. No clear cause identified. PE with 1-2+ pitting edema of extremities. Lungs are clear to auscultation.  -Chart review shows chest x-ray on 12/08/22 without signs of edema.  -Patient has history of hypertensive heart disease without CHF documented and is followed by cardiology, last saw 03/26/22.Marland Kitchen BNP checked today because of his new edema and is elevated at 841.8. Lasix will help with this. Encouraged cardiology follow up. -Start short course of low dose Lasix to help reduce swelling. Monitor for frequent urination and potential drop in blood pressure. CMP shows creatinine WNL. Lengthy discussion had with daughter about patient safety especially while taking lasix. He needs to be monitored closely to prevent falls.  #Falls -Multiple recent falls, some leading to minor injuries. Patient is on a blood thinner, increasing risk of serious  injury from falls. -Consider evaluation of blood thinner use in light of recent falls. Encouraged to discuss this with cardiologist. -Advised immediate medical evaluation if patient falls and remains on the ground for an extended period of time, or if he falls and hits his head.  Strict ED precautions discussed should symptoms worsen.   Heme/Onc History: Oncology History  Kidney cancer, primary, with metastasis from kidney to other site [facility])  09/14/2017 Initial Diagnosis   Kidney cancer, primary, with metastasis from kidney to other site [facility])   09/21/2017 - 12/30/2017 Chemotherapy   The patient had ipilimumab (YERVOY) 100 mg in sodium chloride 0.9 % 50 mL chemo infusion, 1.06 mg/kg = 95 mg, Intravenous,  Once, 4 of 4 cycles Administration: 100 mg (09/21/2017), 100 mg (10/13/2017), 100 mg (11/03/2017), 100 mg (11/24/2017) nivolumab (OPDIVO) 280 mg in sodium chloride 0.9 % 100 mL chemo infusion, 2.93 mg/kg = 287 mg, Intravenous, Once, 6 of 9 cycles Administration: 280 mg (09/21/2017), 240 mg (12/15/2017), 280 mg (10/13/2017), 280 mg (11/03/2017), 280 mg (11/24/2017), 240 mg (12/30/2017)  for chemotherapy treatment.    03/24/2018 - 03/24/2018 Chemotherapy   The patient had nivolumab (OPDIVO) 480 mg in sodium chloride 0.9 % 100 mL chemo infusion, 480 mg, Intravenous, Once, 0 of 6 cycles  for chemotherapy treatment.        Interval history-: Discussed the use of AI scribe software for clinical note transcription with the patient, who gave verbal consent to proceed.    [name] is a 77 y.o. male with oncologic history as above presenting to [facility] today with chief complaint of a two-week history of  progressive swelling in the legs, feet, and hands. He is accompanied by daughter who provides additional history.  The swelling has been persistent and has not shown any signs of improvement. The patient spends most of the day in a recliner with feet elevated and also sleeps in the same position. The patient  was recently hospitalized due to low potassium levels and a high white blood cell count. The patient also had a fall about a week prior to the hospital admission. The patient has been experiencing frequent falls, often falling forward, and has sustained multiple injuries as a result.  The patient's diet mainly consists of Ensure or Boost, and half a chicken sandwich was consumed the day before the consultation. The patient's fluid intake is reportedly poor, and this has been exacerbated by the fear of falling when getting up to use the bathroom.   The patient has shortness of breath at baseline, which is attributed to a history of lung cancer per daughter. However, there has been no noticeable increase in breathlessness or difficulty in breathing. The patient denies any pain associated with the swelling but reports feeling weak.    ROS  All other systems are reviewed and are negative for acute change except as noted in the HPI.    Allergies  Allergen Reactions   Diphenhydramine Other (See Comments)    Severe restless legs     Past Medical History:  Diagnosis Date   Arthritis    Chronic kidney disease    only has one kidney    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 02/01/2015   Coronary artery disease    followed by Dr.Tilley   Frequent PVCs    GERD (gastroesophageal reflux disease)    Heart murmur    never has caused any problems   Hx of cancer of lung 1980's   Hyperlipidemia    Hypertension    Hypothyroidism    Incisional hernia 08/01/2015   Lung cancer (HCC) 1993   Lung metastases 2019   Pneumonia    x several   Recurrent umbilical hernia 08/01/2015     Past Surgical History:  Procedure Laterality Date   APPENDECTOMY  age 4   ARTERY BIOPSY Left 04/10/2022   Procedure: LEFT TEMPORAL ARTERY BIOPSY;  Surgeon: Chuck Hint, MD;  Location: [facility] OR;  Service: Vascular;  Laterality: Left;   CHOLECYSTECTOMY  10/18/1978   EYE SURGERY  Bilateral 2019   cataract   LAPAROSCOPIC LYSIS OF ADHESIONS N/A 08/01/2015   Procedure: LAPAROSCOPIC LYSIS OF ADHESIONS;  Surgeon: Karie Soda, MD;  Location: [facility] ORS;  Service: General;  Laterality: N/A;   LUNG LOBECTOMY  02/16/1990   lung cancer- patient has staples in lung not to have MRI per patient    MAXILLARY ANTROSTOMY Left 05/22/2022   Procedure: MAXILLARY ANTROSTOMY;  Surgeon: Laren Boom, DO;  Location: [facility] OR;  Service: ENT;  Laterality: Left;   PILONIDAL CYST EXCISION  02/16/2009   Dr Gerrit Friends   ROBOTIC ASSITED PARTIAL NEPHRECTOMY Right 02/01/2015   Procedure: RIGHT ROBOTIC ASSISTED LAPAROSOCOPY NEPHRECTOMY;  Surgeon: Crist Fat, MD;  Location: [facility] ORS;  Service: Urology;  Laterality: Right;   SINUS ENDO WITH FUSION Left 05/22/2022   Procedure: SINUS ENDO WITH FUSION;  Surgeon: Laren Boom, DO;  Location: [facility] OR;  Service: ENT;  Laterality: Left;   VENTRAL HERNIA REPAIR N/A 08/01/2015   Procedure: LAPAROSCOPIC VENTRAL WALL HERNIA WITH MESH;  Surgeon: Karie Soda, MD;  Location: [facility] ORS;  Service: General;  Laterality: N/A;    Social History   Socioeconomic History   Marital status: Widowed    Spouse name: Not on file   Number of children: 2   Years of education: Not on file   Highest education level: Not on file  Occupational History   Occupation: Retired  Tobacco Use   Smoking status: Former    Current packs/day: 0.00    Average packs/day: 1 pack/day for 20.0 years (20.0 ttl pk-yrs)    Types: Cigarettes    Start date: 01/29/1968    Quit date: 01/29/1988    Years since quitting: 34.9   Smokeless tobacco: Never  Vaping Use   Vaping status: Never Used  Substance and Sexual Activity   Alcohol use: Not Currently    Comment: rare   Drug use: No   Sexual activity: Not Currently  Other Topics Concern   Not on file  Social History Narrative   Not on file   Social Determinants of Health   Financial Resource Strain: Not on file  Food Insecurity:  No Food Insecurity (12/08/2022)   Hunger Vital Sign    Worried About Running Out of Food in the Last Year: Never true    Ran Out of Food in the Last Year: Never true  Transportation Needs: No Transportation Needs (12/08/2022)   PRAPARE - Administrator, Civil Service (Medical): No    Lack of Transportation (Non-Medical): No  Physical Activity: Not on file  Stress: Not on file  Social Connections: Unknown (06/30/2021)   Received from [facility]   Social Network    Social Network: Not on file  Intimate Partner Violence: Not At Risk (12/08/2022)   Humiliation, Afraid, Rape, and Kick questionnaire    Fear of Current or Ex-Partner: No    Emotionally Abused: No    Physically Abused: No    Sexually Abused: No    Family History  Problem Relation Age of Onset   Heart attack Father      Current Outpatient Medications:    furosemide (LASIX) 20 MG tablet, Take 1 tablet (20 mg total) by mouth daily with breakfast for 5 days., Disp: 5 tablet, Rfl: 0   acetaminophen (TYLENOL) 500 MG tablet, Take 1,000 mg by mouth 2 (two) times daily., Disp: , Rfl:    apixaban (ELIQUIS) 2.5 MG TABS tablet, Take 1 tablet (2.5 mg total) by mouth 2 (two) times daily., Disp: 180 tablet, Rfl: 1   belzutifan (WELIREG) 40 MG tablet, Take 3 tablets (120 mg total) by mouth daily. (Patient not taking: Reported on 12/08/2022), Disp: 90 tablet, Rfl: 3   diazepam (VALIUM) 2 MG tablet, TAKE 1-2 TABLETS BY MOUTH EVERY 8 HOURS AS NEEDED FOR ANXIETY (Patient taking differently: Take 2 mg by mouth every 8 (eight) hours as needed for anxiety.), Disp: 60 tablet, Rfl: 0   diltiazem (CARDIZEM CD) 240 MG 24 hr capsule, Take 1 capsule (240 mg total) by mouth in the morning., Disp: 90 capsule, Rfl: 3   ezetimibe (ZETIA) 10 MG tablet, Take 1 tablet (10 mg total) by mouth daily. (Patient not taking: Reported on 12/08/2022), Disp: 90 tablet, Rfl: 3   famotidine (PEPCID) 20 MG tablet, Take 20 mg by mouth 2 (two)  times daily as needed., Disp: , Rfl:    fluticasone (FLONASE) 50 MCG/ACT nasal spray, Place 2 sprays into both nostrils daily as needed for allergies., Disp: , Rfl:    hydrocortisone (CORTEF) 5 MG tablet, Take by mouth  15 mg in am and 5 mg in pm (Patient taking differently: Take 25 mg by mouth daily. Take by mouth 20 mg in am and 5 mg in pm), Disp: 400 tablet, Rfl: 3   levothyroxine (SYNTHROID) 150 MCG tablet, Take 1 tablet (150 mcg total) by mouth daily before breakfast., Disp: 90 tablet, Rfl: 3   loratadine (CLARITIN) 10 MG tablet, Take 10 mg by mouth daily as needed for allergies., Disp: , Rfl:    megestrol (MEGACE) 20 MG tablet, TAKE 1 TABLET BY MOUTH DAILY, Disp: 30 tablet, Rfl: 1   Multiple Vitamins-Minerals (CENTRUM SILVER 50+MEN) TABS, Take 1 tablet by mouth daily with breakfast., Disp: , Rfl:    mupirocin ointment (BACTROBAN) 2 %, Place 1 Application into the nose daily as needed (sores)., Disp: , Rfl:    posaconazole (NOXAFIL) 100 MG TBEC delayed-release tablet, Take 3 tablets (300 mg total) by mouth daily., Disp: 42 tablet, Rfl: 2   potassium chloride 20 MEQ/15ML (10%) SOLN, Take 30 mLs (40 mEq total) by mouth daily for 4 days., Disp: 120 mL, Rfl: 0   traZODone (DESYREL) 150 MG tablet, Take 150 mg by mouth at bedtime as needed for sleep., Disp: , Rfl:    VITAMIN D PO, Take 1 capsule by mouth daily., Disp: , Rfl:   PHYSICAL EXAM: ECOG FS:2 - Symptomatic, <50% confined to bed    Vitals:   12/18/22 1109  BP: (!) 141/69  Pulse: 89  Resp: 18  Temp: 99.4 F (37.4 C)  TempSrc: Oral  SpO2: 95%   Physical Exam Vitals and nursing note reviewed.  Constitutional:      Appearance: He is not ill-appearing or toxic-appearing.     Comments: Frail appearing  HENT:     Head: Normocephalic.  Eyes:     Conjunctiva/sclera: Conjunctivae normal.  Cardiovascular:     Rate and Rhythm: Normal rate and regular rhythm.     Pulses: Normal pulses.          Carotid pulses are 2+ on the right side  and 2+ on the left side.    Heart sounds: Normal heart sounds.  Pulmonary:     Effort: Pulmonary effort is normal.     Breath sounds: Normal breath sounds.  Abdominal:     General: There is no distension.  Musculoskeletal:     Cervical back: Normal range of motion.     Right lower leg: 1+ Pitting Edema present.     Left lower leg: 1+ Pitting Edema present.     Comments: Non pitting edema noted to bilateral hands  Skin:    General: Skin is warm and dry.     Comments: Skin tears on right forearm. No active bleeding  Neurological:     Mental Status: He is alert.        LABORATORY DATA: I have reviewed the data as listed    Latest Ref Rng & Units 12/18/2022    9:01 AM 12/15/2022   10:10 AM 12/11/2022    5:10 AM  CBC  WBC 4.0 - 10.5 K/uL 15.3  14.3  16.0   Hemoglobin 13.0 - 17.0 g/dL 7.5  7.8  9.2   Hematocrit 39.0 - 52.0 % 25.6  27.4  33.0   Platelets 150 - 400 K/uL 494  434  521         Latest Ref Rng & Units 12/18/2022    9:01 AM 12/15/2022   10:10 AM 12/11/2022    5:10 AM  CMP  Glucose  70 - 99 mg/dL 161  096  045   BUN 8 - 23 mg/dL 10  13  19    Creatinine 0.61 - 1.24 mg/dL 4.09  8.11  9.14   Sodium 135 - 145 mmol/L 138  138  136   Potassium 3.5 - 5.1 mmol/L 3.0  3.6  3.8   Chloride 98 - 111 mmol/L 98  97  99   CO2 22 - 32 mmol/L 32  32  25   Calcium 8.9 - 10.3 mg/dL 8.3  8.4  8.3   Total Protein 6.5 - 8.1 g/dL 6.2  6.2    Total Bilirubin 0.3 - 1.2 mg/dL 0.5  0.4    Alkaline Phos 38 - 126 U/L 140  151    AST 15 - 41 U/L 8  9    ALT 0 - 44 U/L 8  8         RADIOGRAPHIC STUDIES (from last 24 hours if applicable) I have personally reviewed the radiological images as listed and agreed with the findings in the report. No results found.      Visit Diagnosis: 1. Generalized edema   2. Primary malignant neoplasm of kidney with metastasis from kidney to other site, unspecified laterality (HCC)      Orders Placed This Encounter  Procedures   BNP (Brain  natriuretic peptide)    Standing Status:   Future    Number of Occurrences:   1    Standing Expiration Date:   12/18/2023    All questions were answered. The patient knows to call the clinic with any problems, questions or concerns. No barriers to learning was detected.  A total of more than 30 minutes were spent on this encounter with face-to-face time and non-face-to-face time, including preparing to see the patient, ordering tests and/or medications, counseling the patient and coordination of care as outlined above.    Thank you for allowing me to participate in the care of this patient.    Shanon Ace, PA-C Department of Hematology/Oncology [facility] at [facility] Phone: [phone]  [phone]    12/18/2022 3:35 PM

## 2022-12-18 NOTE — Patient Instructions (Signed)
During today's visit, we discussed your recent swelling in the legs, feet, and hands, as well as your history of frequent falls.   YOUR PLAN:  -PERIPHERAL EDEMA: Peripheral edema is swelling caused by fluid retention in the body's tissues. We will start you on Lasix to help reduce the swelling. Please monitor for frequent urination and any potential drop in blood pressure. Use a urinal to decrease the risk of falls when getting up to use the bathroom. If the lasix makes you feel weak or dizzy you can skip a dose. This medication is safe to be used as needed.  -FALLS: You have experienced multiple recent falls, which is concerning, especially since you are on a blood thinner that increases the risk of serious injury. Please review the necessity of your blood thinner with the prescribing provider, especially given your recent falls and the associated risks. If you fall and remain on the ground for an extended period or hit your head, seek immediate medical attention.   INSTRUCTIONS:  Please follow up at your upcoming appointment on Monday to discuss the swelling in more detail.

## 2022-12-19 LAB — TYPE AND SCREEN
ABO/RH(D): B POS
Antibody Screen: NEGATIVE
Unit division: 0

## 2022-12-19 LAB — BPAM RBC
Blood Product Expiration Date: 202411202359
ISSUE DATE / TIME: 202411011101
Unit Type and Rh: 7300

## 2022-12-20 ENCOUNTER — Emergency Department (HOSPITAL_COMMUNITY): Payer: Medicare HMO

## 2022-12-20 ENCOUNTER — Observation Stay (HOSPITAL_COMMUNITY)
Admission: EM | Admit: 2022-12-20 | Discharge: 2022-12-22 | Disposition: A | Discharge status: Home / Self Care | Payer: Medicare HMO | Attending: Student in an Organized Health Care Education/Training Program | Admitting: Student in an Organized Health Care Education/Training Program

## 2022-12-20 ENCOUNTER — Observation Stay (HOSPITAL_COMMUNITY): Payer: Medicare HMO

## 2022-12-20 DIAGNOSIS — I10 Essential (primary) hypertension: Secondary | ICD-10-CM | POA: Diagnosis present

## 2022-12-20 DIAGNOSIS — D638 Anemia in other chronic diseases classified elsewhere: Secondary | ICD-10-CM | POA: Diagnosis present

## 2022-12-20 DIAGNOSIS — C649 Malignant neoplasm of unspecified kidney, except renal pelvis: Secondary | ICD-10-CM | POA: Diagnosis present

## 2022-12-20 DIAGNOSIS — I129 Hypertensive chronic kidney disease with stage 1 through stage 4 chronic kidney disease, or unspecified chronic kidney disease: Secondary | ICD-10-CM | POA: Insufficient documentation

## 2022-12-20 DIAGNOSIS — K219 Gastro-esophageal reflux disease without esophagitis: Secondary | ICD-10-CM | POA: Diagnosis present

## 2022-12-20 DIAGNOSIS — D75839 Thrombocytosis, unspecified: Secondary | ICD-10-CM | POA: Diagnosis not present

## 2022-12-20 DIAGNOSIS — Z87891 Personal history of nicotine dependence: Secondary | ICD-10-CM | POA: Insufficient documentation

## 2022-12-20 DIAGNOSIS — N1831 Chronic kidney disease, stage 3a: Secondary | ICD-10-CM | POA: Insufficient documentation

## 2022-12-20 DIAGNOSIS — R2689 Other abnormalities of gait and mobility: Secondary | ICD-10-CM | POA: Diagnosis not present

## 2022-12-20 DIAGNOSIS — M6281 Muscle weakness (generalized): Secondary | ICD-10-CM | POA: Insufficient documentation

## 2022-12-20 DIAGNOSIS — R55 Syncope and collapse: Principal | ICD-10-CM | POA: Insufficient documentation

## 2022-12-20 DIAGNOSIS — B49 Unspecified mycosis: Secondary | ICD-10-CM | POA: Diagnosis present

## 2022-12-20 DIAGNOSIS — W19XXXA Unspecified fall, initial encounter: Secondary | ICD-10-CM | POA: Diagnosis not present

## 2022-12-20 DIAGNOSIS — E271 Primary adrenocortical insufficiency: Secondary | ICD-10-CM | POA: Insufficient documentation

## 2022-12-20 DIAGNOSIS — Z85528 Personal history of other malignant neoplasm of kidney: Secondary | ICD-10-CM | POA: Insufficient documentation

## 2022-12-20 DIAGNOSIS — E876 Hypokalemia: Secondary | ICD-10-CM | POA: Diagnosis not present

## 2022-12-20 DIAGNOSIS — Z79899 Other long term (current) drug therapy: Secondary | ICD-10-CM | POA: Diagnosis not present

## 2022-12-20 DIAGNOSIS — J929 Pleural plaque without asbestos: Secondary | ICD-10-CM | POA: Diagnosis not present

## 2022-12-20 DIAGNOSIS — R2681 Unsteadiness on feet: Secondary | ICD-10-CM | POA: Diagnosis not present

## 2022-12-20 DIAGNOSIS — Z7901 Long term (current) use of anticoagulants: Secondary | ICD-10-CM | POA: Diagnosis not present

## 2022-12-20 DIAGNOSIS — I480 Paroxysmal atrial fibrillation: Secondary | ICD-10-CM | POA: Diagnosis not present

## 2022-12-20 DIAGNOSIS — R000 Tachycardia, unspecified: Secondary | ICD-10-CM | POA: Diagnosis not present

## 2022-12-20 DIAGNOSIS — E039 Hypothyroidism, unspecified: Secondary | ICD-10-CM | POA: Insufficient documentation

## 2022-12-20 DIAGNOSIS — R9082 White matter disease, unspecified: Secondary | ICD-10-CM | POA: Diagnosis not present

## 2022-12-20 DIAGNOSIS — D631 Anemia in chronic kidney disease: Secondary | ICD-10-CM | POA: Diagnosis not present

## 2022-12-20 DIAGNOSIS — R0682 Tachypnea, not elsewhere classified: Secondary | ICD-10-CM | POA: Insufficient documentation

## 2022-12-20 DIAGNOSIS — E440 Moderate protein-calorie malnutrition: Secondary | ICD-10-CM | POA: Insufficient documentation

## 2022-12-20 DIAGNOSIS — Z043 Encounter for examination and observation following other accident: Secondary | ICD-10-CM | POA: Diagnosis not present

## 2022-12-20 DIAGNOSIS — I2510 Atherosclerotic heart disease of native coronary artery without angina pectoris: Secondary | ICD-10-CM | POA: Diagnosis not present

## 2022-12-20 DIAGNOSIS — Z85118 Personal history of other malignant neoplasm of bronchus and lung: Secondary | ICD-10-CM | POA: Insufficient documentation

## 2022-12-20 DIAGNOSIS — E2740 Unspecified adrenocortical insufficiency: Secondary | ICD-10-CM | POA: Diagnosis present

## 2022-12-20 DIAGNOSIS — E43 Unspecified severe protein-calorie malnutrition: Secondary | ICD-10-CM | POA: Insufficient documentation

## 2022-12-20 DIAGNOSIS — E785 Hyperlipidemia, unspecified: Secondary | ICD-10-CM | POA: Diagnosis present

## 2022-12-20 DIAGNOSIS — J329 Chronic sinusitis, unspecified: Secondary | ICD-10-CM | POA: Diagnosis not present

## 2022-12-20 DIAGNOSIS — G319 Degenerative disease of nervous system, unspecified: Secondary | ICD-10-CM | POA: Diagnosis not present

## 2022-12-20 DIAGNOSIS — R296 Repeated falls: Secondary | ICD-10-CM | POA: Diagnosis not present

## 2022-12-20 DIAGNOSIS — S0990XA Unspecified injury of head, initial encounter: Secondary | ICD-10-CM

## 2022-12-20 DIAGNOSIS — I700 Atherosclerosis of aorta: Secondary | ICD-10-CM | POA: Diagnosis present

## 2022-12-20 DIAGNOSIS — R630 Anorexia: Secondary | ICD-10-CM

## 2022-12-20 DIAGNOSIS — R9089 Other abnormal findings on diagnostic imaging of central nervous system: Secondary | ICD-10-CM | POA: Diagnosis not present

## 2022-12-20 DIAGNOSIS — R634 Abnormal weight loss: Secondary | ICD-10-CM

## 2022-12-20 DIAGNOSIS — I5189 Other ill-defined heart diseases: Secondary | ICD-10-CM

## 2022-12-20 LAB — COMPREHENSIVE METABOLIC PANEL
ALT: 12 U/L (ref 0–44)
AST: 13 U/L — ABNORMAL LOW (ref 15–41)
Albumin: 2.3 g/dL — ABNORMAL LOW (ref 3.5–5.0)
Alkaline Phosphatase: 123 U/L (ref 38–126)
Anion gap: 18 — ABNORMAL HIGH (ref 5–15)
BUN: 9 mg/dL (ref 8–23)
CO2: 27 mmol/L (ref 22–32)
Calcium: 8 mg/dL — ABNORMAL LOW (ref 8.9–10.3)
Chloride: 94 mmol/L — ABNORMAL LOW (ref 98–111)
Creatinine, Ser: 0.77 mg/dL (ref 0.61–1.24)
GFR, Estimated: >60 mL/min (ref >60)
Glucose, Bld: 204 mg/dL — ABNORMAL HIGH (ref 70–99)
Potassium: 2.4 mmol/L — CL (ref 3.5–5.1)
Sodium: 139 mmol/L (ref 135–145)
Total Bilirubin: 0.7 mg/dL (ref 0.3–1.2)
Total Protein: 6.4 g/dL — ABNORMAL LOW (ref 6.5–8.1)

## 2022-12-20 LAB — CBC WITH DIFFERENTIAL/PLATELET
Abs Immature Granulocytes: 0.18 10*3/uL — ABNORMAL HIGH (ref 0.00–0.07)
Basophils Absolute: 0 10*3/uL (ref 0.0–0.1)
Basophils Relative: 0 %
Eosinophils Absolute: 0.1 10*3/uL (ref 0.0–0.5)
Eosinophils Relative: 1 %
HCT: 33 % — ABNORMAL LOW (ref 39.0–52.0)
Hemoglobin: 9.6 g/dL — ABNORMAL LOW (ref 13.0–17.0)
Immature Granulocytes: 1 %
Lymphocytes Relative: 9 %
Lymphs Abs: 1.4 10*3/uL (ref 0.7–4.0)
MCH: 24.1 pg — ABNORMAL LOW (ref 26.0–34.0)
MCHC: 29.1 g/dL — ABNORMAL LOW (ref 30.0–36.0)
MCV: 82.7 fL (ref 80.0–100.0)
Monocytes Absolute: 1.3 10*3/uL — ABNORMAL HIGH (ref 0.1–1.0)
Monocytes Relative: 8 %
Neutro Abs: 13.3 10*3/uL — ABNORMAL HIGH (ref 1.7–7.7)
Neutrophils Relative %: 81 %
Platelets: 477 10*3/uL — ABNORMAL HIGH (ref 150–400)
RBC: 3.99 MIL/uL — ABNORMAL LOW (ref 4.22–5.81)
RDW: 19.5 % — ABNORMAL HIGH (ref 11.5–15.5)
WBC: 16.3 10*3/uL — ABNORMAL HIGH (ref 4.0–10.5)
nRBC: 0 % (ref 0.0–0.2)

## 2022-12-20 LAB — URINALYSIS, ROUTINE W REFLEX MICROSCOPIC
Bilirubin Urine: NEGATIVE
Glucose, UA: 50 mg/dL — AB
Hgb urine dipstick: NEGATIVE
Ketones, ur: NEGATIVE mg/dL
Leukocytes,Ua: NEGATIVE
Nitrite: NEGATIVE
Protein, ur: 100 mg/dL — AB
Specific Gravity, Urine: 1.017 (ref 1.005–1.030)
pH: 6 (ref 5.0–8.0)

## 2022-12-20 LAB — BASIC METABOLIC PANEL
Anion gap: 9 (ref 5–15)
BUN: 7 mg/dL — ABNORMAL LOW (ref 8–23)
CO2: 31 mmol/L (ref 22–32)
Calcium: 7.5 mg/dL — ABNORMAL LOW (ref 8.9–10.3)
Chloride: 95 mmol/L — ABNORMAL LOW (ref 98–111)
Creatinine, Ser: 0.74 mg/dL (ref 0.61–1.24)
GFR, Estimated: >60 mL/min (ref >60)
Glucose, Bld: 167 mg/dL — ABNORMAL HIGH (ref 70–99)
Potassium: 3.4 mmol/L — ABNORMAL LOW (ref 3.5–5.1)
Sodium: 135 mmol/L (ref 135–145)

## 2022-12-20 LAB — MAGNESIUM: Magnesium: 1.5 mg/dL — ABNORMAL LOW (ref 1.7–2.4)

## 2022-12-20 LAB — TROPONIN I (HIGH SENSITIVITY)
Troponin I (High Sensitivity): 22 ng/L — ABNORMAL HIGH (ref <18)
Troponin I (High Sensitivity): 24 ng/L — ABNORMAL HIGH (ref <18)

## 2022-12-20 LAB — BRAIN NATRIURETIC PEPTIDE: B Natriuretic Peptide: 569.6 pg/mL — ABNORMAL HIGH (ref 0.0–100.0)

## 2022-12-20 LAB — LACTIC ACID, PLASMA: Lactic Acid, Venous: 1.5 mmol/L (ref 0.5–1.9)

## 2022-12-20 MED ORDER — DIAZEPAM 2 MG PO TABS: 2.0000 mg | ORAL_TABLET | Freq: Two times a day (BID) | ORAL | Status: DC | PRN

## 2022-12-20 MED ORDER — ONDANSETRON HCL 4 MG/2ML IJ SOLN: 4.0000 mg | Freq: Four times a day (QID) | INTRAMUSCULAR | Status: DC | PRN

## 2022-12-20 MED ORDER — POTASSIUM CHLORIDE CRYS ER 20 MEQ PO TBCR
40.0000 meq | EXTENDED_RELEASE_TABLET | Freq: Once | ORAL | Status: AC
  Administered 2022-12-20: 40 meq via ORAL
  Filled 2022-12-20: qty 2

## 2022-12-20 MED ORDER — POTASSIUM CHLORIDE 10 MEQ/100ML IV SOLN
INTRAVENOUS | Status: AC
  Filled 2022-12-20: qty 100

## 2022-12-20 MED ORDER — HYDROCORTISONE 5 MG PO TABS: 25.0000 mg | ORAL_TABLET | Freq: Every day | ORAL | Status: DC

## 2022-12-20 MED ORDER — ONDANSETRON HCL 4 MG PO TABS: 4.0000 mg | ORAL_TABLET | Freq: Four times a day (QID) | ORAL | Status: DC | PRN

## 2022-12-20 MED ORDER — DILTIAZEM HCL ER COATED BEADS 240 MG PO CP24
240.0000 mg | ORAL_CAPSULE | Freq: Every day | ORAL | Status: DC
  Administered 2022-12-21 – 2022-12-22 (×2): 240 mg via ORAL
  Filled 2022-12-20 (×2): qty 1

## 2022-12-20 MED ORDER — HYDROCORTISONE 20 MG PO TABS: 20.0000 mg | ORAL_TABLET | Freq: Every morning | ORAL | Status: DC

## 2022-12-20 MED ORDER — HYDROCORTISONE 20 MG PO TABS
20.0000 mg | ORAL_TABLET | Freq: Every day | ORAL | Status: DC
  Administered 2022-12-21 – 2022-12-22 (×2): 20 mg via ORAL
  Filled 2022-12-20 (×2): qty 1

## 2022-12-20 MED ORDER — LEVOTHYROXINE SODIUM 150 MCG PO TABS
150.0000 ug | ORAL_TABLET | Freq: Every day | ORAL | Status: DC
  Administered 2022-12-21 – 2022-12-22 (×2): 150 ug via ORAL
  Filled 2022-12-20 (×2): qty 1

## 2022-12-20 MED ORDER — MAGNESIUM SULFATE 2 GM/50ML IV SOLN
2.0000 g | Freq: Once | INTRAVENOUS | Status: AC
  Administered 2022-12-20: 2 g via INTRAVENOUS
  Filled 2022-12-20: qty 50

## 2022-12-20 MED ORDER — DILTIAZEM HCL 25 MG/5ML IV SOLN
10.0000 mg | Freq: Once | INTRAVENOUS | Status: AC
  Administered 2022-12-20: 10 mg via INTRAVENOUS
  Filled 2022-12-20: qty 5

## 2022-12-20 MED ORDER — ACETAMINOPHEN 650 MG RE SUPP: 650.0000 mg | Freq: Four times a day (QID) | RECTAL | Status: DC | PRN

## 2022-12-20 MED ORDER — SODIUM CHLORIDE 0.9% FLUSH
3.0000 mL | Freq: Two times a day (BID) | INTRAVENOUS | Status: DC
  Administered 2022-12-20 – 2022-12-22 (×4): 3 mL via INTRAVENOUS

## 2022-12-20 MED ORDER — APIXABAN 2.5 MG PO TABS
2.5000 mg | ORAL_TABLET | Freq: Two times a day (BID) | ORAL | Status: DC
  Administered 2022-12-20 – 2022-12-21 (×2): 2.5 mg via ORAL
  Filled 2022-12-20 (×2): qty 1

## 2022-12-20 MED ORDER — ACETAMINOPHEN 325 MG PO TABS: 650.0000 mg | ORAL_TABLET | Freq: Four times a day (QID) | ORAL | Status: DC | PRN

## 2022-12-20 MED ORDER — POTASSIUM CHLORIDE CRYS ER 20 MEQ PO TBCR
40.0000 meq | EXTENDED_RELEASE_TABLET | Freq: Every day | ORAL | Status: DC
  Administered 2022-12-21: 40 meq via ORAL
  Filled 2022-12-20 (×2): qty 2

## 2022-12-20 MED ORDER — FAMOTIDINE 20 MG PO TABS
20.0000 mg | ORAL_TABLET | Freq: Every day | ORAL | Status: DC
  Administered 2022-12-20 – 2022-12-22 (×3): 20 mg via ORAL
  Filled 2022-12-20 (×3): qty 1

## 2022-12-20 MED ORDER — MEGESTROL ACETATE 20 MG PO TABS
20.0000 mg | ORAL_TABLET | Freq: Every day | ORAL | Status: DC
  Administered 2022-12-21 – 2022-12-22 (×2): 20 mg via ORAL
  Filled 2022-12-20 (×2): qty 1

## 2022-12-20 MED ORDER — MAGNESIUM SULFATE 50 % IJ SOLN: 2.0000 g | Freq: Once | INTRAMUSCULAR | Status: DC

## 2022-12-20 MED ORDER — APIXABAN 2.5 MG PO TABS: 2.5000 mg | ORAL_TABLET | Freq: Every day | ORAL | Status: DC

## 2022-12-20 MED ORDER — LORATADINE 10 MG PO TABS
10.0000 mg | ORAL_TABLET | Freq: Every day | ORAL | Status: DC
  Administered 2022-12-21 – 2022-12-22 (×2): 10 mg via ORAL
  Filled 2022-12-20 (×2): qty 1

## 2022-12-20 MED ORDER — FUROSEMIDE 10 MG/ML IJ SOLN
20.0000 mg | Freq: Once | INTRAMUSCULAR | Status: AC
  Administered 2022-12-20: 20 mg via INTRAVENOUS
  Filled 2022-12-20: qty 2

## 2022-12-20 MED ORDER — LACTATED RINGERS IV BOLUS
1000.0000 mL | Freq: Once | INTRAVENOUS | Status: AC
  Administered 2022-12-20: 1000 mL via INTRAVENOUS

## 2022-12-20 MED ORDER — HYDROCORTISONE 5 MG PO TABS
5.0000 mg | ORAL_TABLET | Freq: Every day | ORAL | Status: DC
  Administered 2022-12-20 – 2022-12-22 (×3): 5 mg via ORAL
  Filled 2022-12-20 (×4): qty 1

## 2022-12-20 MED ORDER — POTASSIUM CHLORIDE 10 MEQ/100ML IV SOLN
10.0000 meq | INTRAVENOUS | Status: DC
  Administered 2022-12-20 (×6): 10 meq via INTRAVENOUS
  Filled 2022-12-20 (×6): qty 100

## 2022-12-20 MED ORDER — POSACONAZOLE 100 MG PO TBEC
300.0000 mg | DELAYED_RELEASE_TABLET | Freq: Every day | ORAL | Status: DC
  Administered 2022-12-20 – 2022-12-22 (×3): 300 mg via ORAL
  Filled 2022-12-20 (×3): qty 3

## 2022-12-20 MED ORDER — POTASSIUM CHLORIDE CRYS ER 20 MEQ PO TBCR
40.0000 meq | EXTENDED_RELEASE_TABLET | Freq: Every day | ORAL | Status: AC
  Administered 2022-12-20: 40 meq via ORAL
  Filled 2022-12-20: qty 2

## 2022-12-20 NOTE — ED Triage Notes (Signed)
Patient here from home reporting multiple falls this week with fall last night. Reports hitting back of head. Bruise noted with redness. Noted old healing bruise to right side of forehead. Walks with walker on Eliquis and Cardizem.

## 2022-12-20 NOTE — ED Notes (Signed)
ED TO INPATIENT HANDOFF REPORT  ED Nurse Name and Phone #: Soni Kegel Dorris Carnes  S Name/Age/Gender [name] 77 y.o. male Room/Bed: RESA/RESA  Code Status   Code Status: Prior  Home/SNF/Other Home Patient oriented to: self, place, time, and situation Is this baseline? Yes   Triage Complete: Triage complete  Chief Complaint Syncope [R55]  Triage Note Patient here from home reporting multiple falls this week with fall last night. Reports hitting back of head. Bruise noted with redness. Noted old healing bruise to right side of forehead. Walks with walker on Eliquis and Cardizem.    Allergies Allergies  Allergen Reactions   Diphenhydramine Other (See Comments)    Severe restless legs    Level of Care/Admitting Diagnosis ED Disposition     ED Disposition  Admit   Condition  --   Comment  Hospital Area: [facility] [[id number]]  Level of Care: Telemetry [5]  Admit to tele based on following criteria: Monitor QTC interval  Admit to tele based on following criteria: Eval of Syncope  May place patient in observation at [facility] or [facility] if equivalent level of care is available:: No  Covid Evaluation: Asymptomatic - no recent exposure (last 10 days) testing not required  Diagnosis: Syncope [206001]  Admitting Physician: Bobette Mo [[id number]]  Attending Physician: Bobette Mo [[id number]]          B Medical/Surgery History Past Medical History:  Diagnosis Date   Arthritis    Chronic kidney disease    only has one kidney    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 02/01/2015   Coronary artery disease    followed by Dr.Tilley   Frequent PVCs    GERD (gastroesophageal reflux disease)    Heart murmur    never has caused any problems   Hx of cancer of lung 1980's   Hyperlipidemia    Hypertension    Hypothyroidism    Incisional hernia 08/01/2015   Lung cancer (HCC) 1993   Lung metastases 2019   Pneumonia     x several   Recurrent umbilical hernia 08/01/2015   Past Surgical History:  Procedure Laterality Date   APPENDECTOMY  age 82   ARTERY BIOPSY Left 04/10/2022   Procedure: LEFT TEMPORAL ARTERY BIOPSY;  Surgeon: Chuck Hint, MD;  Location: [facility] OR;  Service: Vascular;  Laterality: Left;   CHOLECYSTECTOMY  10/18/1978   EYE SURGERY Bilateral 2019   cataract   LAPAROSCOPIC LYSIS OF ADHESIONS N/A 08/01/2015   Procedure: LAPAROSCOPIC LYSIS OF ADHESIONS;  Surgeon: Karie Soda, MD;  Location: [facility] ORS;  Service: General;  Laterality: N/A;   LUNG LOBECTOMY  02/16/1990   lung cancer- patient has staples in lung not to have MRI per patient    MAXILLARY ANTROSTOMY Left 05/22/2022   Procedure: MAXILLARY ANTROSTOMY;  Surgeon: Laren Boom, DO;  Location: [facility] OR;  Service: ENT;  Laterality: Left;   PILONIDAL CYST EXCISION  02/16/2009   Dr Gerrit Friends   ROBOTIC ASSITED PARTIAL NEPHRECTOMY Right 02/01/2015   Procedure: RIGHT ROBOTIC ASSISTED LAPAROSOCOPY NEPHRECTOMY;  Surgeon: Crist Fat, MD;  Location: [facility] ORS;  Service: Urology;  Laterality: Right;   SINUS ENDO WITH FUSION Left 05/22/2022   Procedure: SINUS ENDO WITH FUSION;  Surgeon: Laren Boom, DO;  Location: [facility] OR;  Service: ENT;  Laterality: Left;   VENTRAL HERNIA REPAIR N/A 08/01/2015   Procedure: LAPAROSCOPIC VENTRAL WALL HERNIA WITH MESH;  Surgeon: Karie Soda, MD;  Location:  [facility] ORS;  Service: General;  Laterality: N/A;     A IV Location/Drains/Wounds Patient Lines/Drains/Airways Status     Active Line/Drains/Airways     Name Placement date Placement time Site Days   Peripheral IV 12/20/22 22 G 1.75" Anterior;Left;Proximal Forearm 12/20/22  1214  Forearm  less than 1   Incision - 1 Port  --  --  -- --            Intake/Output Last 24 hours No intake or output data in the 24 hours ending 12/20/22 1655  Labs/Imaging Results for orders placed or performed during the hospital encounter of 12/20/22 (from the  past 48 hour(s))  Comprehensive metabolic panel     Status: Abnormal   Collection Time: 12/20/22 10:20 AM  Result Value Ref Range   Sodium 139 135 - 145 mmol/L   Potassium 2.4 (LL) 3.5 - 5.1 mmol/L    Comment: CRITICAL RESULT CALLED TO, READ BACK BY AND VERIFIED WITH BRUNSON,T. RN AT 1127 12/20/22 MULLINS,T    Chloride 94 (L) 98 - 111 mmol/L   CO2 27 22 - 32 mmol/L   Glucose, Bld 204 (H) 70 - 99 mg/dL    Comment: Glucose reference range applies only to samples taken after fasting for at least 8 hours.   BUN 9 8 - 23 mg/dL   Creatinine, Ser 4.33 0.61 - 1.24 mg/dL   Calcium 8.0 (L) 8.9 - 10.3 mg/dL   Total Protein 6.4 (L) 6.5 - 8.1 g/dL   Albumin 2.3 (L) 3.5 - 5.0 g/dL   AST 13 (L) 15 - 41 U/L   ALT 12 0 - 44 U/L   Alkaline Phosphatase 123 38 - 126 U/L   Total Bilirubin 0.7 0.3 - 1.2 mg/dL   GFR, Estimated >29 >51 mL/min    Comment: (NOTE) Calculated using the CKD-EPI Creatinine Equation (2021)    Anion gap 18 (H) 5 - 15    Comment: Performed at [facility], [address], [place], [place] [address]  CBC with Differential     Status: Abnormal   Collection Time: 12/20/22 10:20 AM  Result Value Ref Range   WBC 16.3 (H) 4.0 - 10.5 K/uL   RBC 3.99 (L) 4.22 - 5.81 MIL/uL   Hemoglobin 9.6 (L) 13.0 - 17.0 g/dL   HCT 60.6 (L) 30.1 - 60.1 %   MCV 82.7 80.0 - 100.0 fL   MCH 24.1 (L) 26.0 - 34.0 pg   MCHC 29.1 (L) 30.0 - 36.0 g/dL   RDW 09.3 (H) 23.5 - 57.3 %   Platelets 477 (H) 150 - 400 K/uL   nRBC 0.0 0.0 - 0.2 %   Neutrophils Relative % 81 %   Neutro Abs 13.3 (H) 1.7 - 7.7 K/uL   Lymphocytes Relative 9 %   Lymphs Abs 1.4 0.7 - 4.0 K/uL   Monocytes Relative 8 %   Monocytes Absolute 1.3 (H) 0.1 - 1.0 K/uL   Eosinophils Relative 1 %   Eosinophils Absolute 0.1 0.0 - 0.5 K/uL   Basophils Relative 0 %   Basophils Absolute 0.0 0.0 - 0.1 K/uL   Immature Granulocytes 1 %   Abs Immature Granulocytes 0.18 (H) 0.00 - 0.07 K/uL    Comment: Performed at [facility], [address] [place], [place] [address]  Brain natriuretic peptide     Status: Abnormal   Collection Time: 12/20/22 10:20 AM  Result Value Ref Range   B Natriuretic Peptide 569.6 (H) 0.0 -  100.0 pg/mL    Comment: Performed at [facility], [address], [place], [place] [address]  Urinalysis, Routine w reflex microscopic -Urine, Clean Catch     Status: Abnormal   Collection Time: 12/20/22 11:45 AM  Result Value Ref Range   Color, Urine YELLOW YELLOW   APPearance CLEAR CLEAR   Specific Gravity, Urine 1.017 1.005 - 1.030   pH 6.0 5.0 - 8.0   Glucose, UA 50 (A) NEGATIVE mg/dL   Hgb urine dipstick NEGATIVE NEGATIVE   Bilirubin Urine NEGATIVE NEGATIVE   Ketones, ur NEGATIVE NEGATIVE mg/dL   Protein, ur 191 (A) NEGATIVE mg/dL   Nitrite NEGATIVE NEGATIVE   Leukocytes,Ua NEGATIVE NEGATIVE   RBC / HPF 0-5 0 - 5 RBC/hpf   WBC, UA 0-5 0 - 5 WBC/hpf   Bacteria, UA RARE (A) NONE SEEN   Squamous Epithelial / HPF 0-5 0 - 5 /HPF   Mucus PRESENT    Hyaline Casts, UA PRESENT     Comment: Performed at [facility], [address] [place], [place] [address]  Lactic acid, plasma     Status: None   Collection Time: 12/20/22 12:20 PM  Result Value Ref Range   Lactic Acid, Venous 1.5 0.5 - 1.9 mmol/L    Comment: Performed at [facility], [address], [place], [place] [address]  Troponin I (High Sensitivity)     Status: Abnormal   Collection Time: 12/20/22 12:20 PM  Result Value Ref Range   Troponin I (High Sensitivity) 22 (H) <18 ng/L    Comment: (NOTE) Elevated high sensitivity troponin I (hsTnI) values and significant  changes across serial measurements may suggest ACS but many other  chronic and acute conditions are known to elevate hsTnI results.  Refer to the "Links" section for chest pain algorithms and additional  guidance. Performed at [facility], [address], [place],  [place] [address]   Magnesium     Status: Abnormal   Collection Time: 12/20/22 12:20 PM  Result Value Ref Range   Magnesium 1.5 (L) 1.7 - 2.4 mg/dL    Comment: Performed at [facility], [address] [place], [place] [address]  Troponin I (High Sensitivity)     Status: Abnormal   Collection Time: 12/20/22  3:00 PM  Result Value Ref Range   Troponin I (High Sensitivity) 24 (H) <18 ng/L    Comment: (NOTE) Elevated high sensitivity troponin I (hsTnI) values and significant  changes across serial measurements may suggest ACS but many other  chronic and acute conditions are known to elevate hsTnI results.  Refer to the "Links" section for chest pain algorithms and additional  guidance. Performed at [facility], [address], [place], [place] [address]    CT Head Wo Contrast  Result Date: 12/20/2022 CLINICAL DATA:  Multiple falls including last night. Hit back of head. Posterior bruising and redness. EXAM: CT HEAD WITHOUT CONTRAST CT CERVICAL SPINE WITHOUT CONTRAST TECHNIQUE: Multidetector CT imaging of the head and cervical spine was performed following the standard protocol without intravenous contrast. Multiplanar CT image reconstructions of the cervical spine were also generated. RADIATION DOSE REDUCTION: This exam was performed according to the departmental dose-optimization program which includes automated exposure control, adjustment of the mA and/or kV according to patient size and/or use of iterative reconstruction technique. COMPARISON:  11/27/2022 FINDINGS: CT HEAD FINDINGS Brain: There is no evidence for acute hemorrhage, hydrocephalus, mass lesion, or abnormal extra-axial fluid collection.  No definite CT evidence for acute infarction. Diffuse loss of parenchymal volume is consistent with atrophy. Patchy low attenuation in the deep hemispheric and periventricular white matter is nonspecific, but likely reflects chronic microvascular ischemic  demyelination. Vascular: No hyperdense vessel or unexpected calcification. Skull: No evidence for fracture. No worrisome lytic or sclerotic lesion. Sinuses/Orbits: The visualized paranasal sinuses and mastoid air cells are clear. Visualized portions of the globes and intraorbital fat are unremarkable. Other: None. CT CERVICAL SPINE FINDINGS Alignment: No evidence for traumatic subluxation. Skull base and vertebrae: No acute fracture. No primary bone lesion or focal pathologic process. Soft tissues and spinal canal: No prevertebral fluid or swelling. No visible canal hematoma. Disc levels: Mild loss of disc height noted C6-7 with endplate spurring. Facet degeneration noted on the right at C2-3 and C3-4. Upper chest: Unremarkable. Other: None IMPRESSION: 1. No acute intracranial abnormality. 2. Atrophy with chronic small vessel white matter ischemic disease. 3. No cervical spine fracture or traumatic subluxation. Electronically Signed   By: Kennith Center M.D.   On: 12/20/2022 11:37   CT Cervical Spine Wo Contrast  Result Date: 12/20/2022 CLINICAL DATA:  Multiple falls including last night. Hit back of head. Posterior bruising and redness. EXAM: CT HEAD WITHOUT CONTRAST CT CERVICAL SPINE WITHOUT CONTRAST TECHNIQUE: Multidetector CT imaging of the head and cervical spine was performed following the standard protocol without intravenous contrast. Multiplanar CT image reconstructions of the cervical spine were also generated. RADIATION DOSE REDUCTION: This exam was performed according to the departmental dose-optimization program which includes automated exposure control, adjustment of the mA and/or kV according to patient size and/or use of iterative reconstruction technique. COMPARISON:  11/27/2022 FINDINGS: CT HEAD FINDINGS Brain: There is no evidence for acute hemorrhage, hydrocephalus, mass lesion, or abnormal extra-axial fluid collection. No definite CT evidence for acute infarction. Diffuse loss of parenchymal  volume is consistent with atrophy. Patchy low attenuation in the deep hemispheric and periventricular white matter is nonspecific, but likely reflects chronic microvascular ischemic demyelination. Vascular: No hyperdense vessel or unexpected calcification. Skull: No evidence for fracture. No worrisome lytic or sclerotic lesion. Sinuses/Orbits: The visualized paranasal sinuses and mastoid air cells are clear. Visualized portions of the globes and intraorbital fat are unremarkable. Other: None. CT CERVICAL SPINE FINDINGS Alignment: No evidence for traumatic subluxation. Skull base and vertebrae: No acute fracture. No primary bone lesion or focal pathologic process. Soft tissues and spinal canal: No prevertebral fluid or swelling. No visible canal hematoma. Disc levels: Mild loss of disc height noted C6-7 with endplate spurring. Facet degeneration noted on the right at C2-3 and C3-4. Upper chest: Unremarkable. Other: None IMPRESSION: 1. No acute intracranial abnormality. 2. Atrophy with chronic small vessel white matter ischemic disease. 3. No cervical spine fracture or traumatic subluxation. Electronically Signed   By: Kennith Center M.D.   On: 12/20/2022 11:37   DG Chest 2 View  Result Date: 12/20/2022 CLINICAL DATA:  Multiple falls over the past week.  Tachycardia. EXAM: CHEST - 2 VIEW COMPARISON:  Radiographs 12/08/2022 and 11/27/2022.  CT 11/04/2022. FINDINGS: 1049 hours. The heart size and mediastinal contours are stable. There is stable volume loss in the right hemithorax status post previous right middle and lower lobectomy. Stable pleural thickening on the right. The lungs are clear. There is no pleural effusion or pneumothorax. No acute osseous findings are seen. IMPRESSION: Stable chest status post right middle and lower lobectomy. No evidence of acute cardiopulmonary process. Electronically Signed   By: Hilarie Fredrickson.D.  On: 12/20/2022 11:12    Pending Labs Unresulted Labs (From admission,  onward)     Start     Ordered   12/20/22 2000  Basic metabolic panel  Once-Timed,   TIMED        12/20/22 1649   12/20/22 1035  Blood culture (routine x 2)  BLOOD CULTURE X 2,   R      12/20/22 1034            Vitals/Pain Today's Vitals   12/20/22 1300 12/20/22 1330 12/20/22 1400 12/20/22 1401  BP: (!) 153/110 (!) 163/103 (!) 150/91 (!) 158/82  Pulse: 100 (!) 169 99 (!) 102  Resp: 15 (!) 26 17 (!) 21  Temp:    98.6 F (37 C)  TempSrc:    Axillary  SpO2: 94% 97% 91% 91%    Isolation Precautions No active isolations  Medications Medications  potassium chloride 10 mEq in 100 mL IVPB (10 mEq Intravenous New Bag/Given 12/20/22 1555)  magnesium sulfate IVPB 2 g 50 mL (2 g Intravenous New Bag/Given 12/20/22 1607)  lactated ringers bolus 1,000 mL (0 mLs Intravenous Stopped 12/20/22 1500)  potassium chloride SA (KLOR-CON M) CR tablet 40 mEq (40 mEq Oral Given 12/20/22 1422)    Mobility walks with person assist/ walker     Focused Assessments Hypokalemia   R Recommendations: See Admitting Provider Note  Report given to:   Additional Notes: 22G left forearm placed by vascular team

## 2022-12-20 NOTE — H&P (Signed)
History and Physical    Patient: [name] ZOX:[id number] DOB: March 18, 1945 DOA: 12/20/2022 DOS: the patient was seen and examined on 12/20/2022 PCP: [name], MD  Patient coming from: Home  Chief Complaint:  Chief Complaint  Patient presents with   Fall   Head Injury   HPI: [name] is a 77 y.o. male with medical history significant of osteoarthritis, stage IIIa chronic kidney disease, right nephrectomy, history of renal cell carcinoma, history of lung cancer, no metastasis, history of multiple episodes of pneumonia, CAD, GERD, heart murmur, hyperlipidemia, hypertension, hypothyroidism, incisional hernia, secondary adrenal insufficiency who was brought to the emergency department due to having a fall at home with right-sided frontal head injury.  Not sure if the patient syncopized or not.  History is mostly taken from the patient's daughter.  He has been having numerous falls over the weekend.  He has also been progressively weaker and has some falls last month.  While being examined, the patient felt dyspneic and orthopneic as he received a liter of IV fluids earlier.   No chest pain, palpitations, diaphoresis, PND. He denied fever, chills, rhinorrhea, sore throat, wheezing or hemoptysis.No abdominal pain, nausea, emesis, constipation, melena or hematochezia gets frequent loose stools.  No flank pain, dysuria, frequency or hematuria.  No polyuria, polydipsia, polyphagia or blurred vision.   Lab work: Urinalysis showed glucose of 50 and protein of 100 mg/dL.  As read bacteria microscopic examination.  CBC is her white count 16.3, hemoglobin 9.6 g/dL platelets 811.  Troponin was 22 then 24 ng/L and BNP 569.6 pg/mL.  Lactic acid was normal.  Magnesium was 1.5 mg/dL.  CMP showed potassium of 24 and chloride 94 mmol/L, the rest of the electrolytes were normal after calcium correction.  Normal renal function.  AST 13 units/L, total protein 6.4 and albumin 2.3 g/dL.  The rest of the LFTs were  unremarkable.  Imaging: 2 view chest radiograph showing stable chest with status post right middle and lower lobectomy.  No evidence of acute cardiopulmonary process.  CT head without contrast with no acute intracranial normality.  There was atrophy with chronic small vessel white matter ischemic disease.  CT cervical spine with no fracture or traumatic subluxation.   ED course: Initial vital signs were temperature 98.6 F, pulse 110, respiration 18, BP 144/81 mmHg O2 sat 96% on room air.  The patient received LR 1000 mm bolus, magnesium sulfate 2 g IVPB and KCl 40 mEq p.o. x 1.  Review of Systems: As mentioned in the history of present illness. All other systems reviewed and are negative.  Past Medical History:  Diagnosis Date   Arthritis    Chronic kidney disease    only has one kidney    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 02/01/2015   Coronary artery disease    followed by Dr.Tilley   Frequent PVCs    GERD (gastroesophageal reflux disease)    Heart murmur    never has caused any problems   Hx of cancer of lung 1980's   Hyperlipidemia    Hypertension    Hypothyroidism    Incisional hernia 08/01/2015   Lung cancer (HCC) 1993   Lung metastases 2019   Pneumonia    x several   Recurrent umbilical hernia 08/01/2015   Past Surgical History:  Procedure Laterality Date   APPENDECTOMY  age 25   ARTERY BIOPSY Left 04/10/2022   Procedure: LEFT TEMPORAL ARTERY BIOPSY;  Surgeon: Chuck Hint, MD;  Location: [facility] OR;  Service: Vascular;  Laterality: Left;   CHOLECYSTECTOMY  10/18/1978   EYE SURGERY Bilateral 2019   cataract   LAPAROSCOPIC LYSIS OF ADHESIONS N/A 08/01/2015   Procedure: LAPAROSCOPIC LYSIS OF ADHESIONS;  Surgeon: Karie Soda, MD;  Location: [facility];  Service: General;  Laterality: N/A;   LUNG LOBECTOMY  02/16/1990   lung cancer- patient has staples in lung not to have MRI per patient    MAXILLARY ANTROSTOMY Left 05/22/2022   Procedure:  MAXILLARY ANTROSTOMY;  Surgeon: Laren Boom, DO;  Location: [facility] OR;  Service: ENT;  Laterality: Left;   PILONIDAL CYST EXCISION  02/16/2009   Dr Gerrit Friends   ROBOTIC ASSITED PARTIAL NEPHRECTOMY Right 02/01/2015   Procedure: RIGHT ROBOTIC ASSISTED LAPAROSOCOPY NEPHRECTOMY;  Surgeon: Crist Fat, MD;  Location: [facility];  Service: Urology;  Laterality: Right;   SINUS ENDO WITH FUSION Left 05/22/2022   Procedure: SINUS ENDO WITH FUSION;  Surgeon: Laren Boom, DO;  Location: [facility] OR;  Service: ENT;  Laterality: Left;   VENTRAL HERNIA REPAIR N/A 08/01/2015   Procedure: LAPAROSCOPIC VENTRAL WALL HERNIA WITH MESH;  Surgeon: Karie Soda, MD;  Location: [facility];  Service: General;  Laterality: N/A;   Social History:  reports that he quit smoking about 34 years ago. His smoking use included cigarettes. He started smoking about 54 years ago. He has a 20 pack-year smoking history. He has never used smokeless tobacco. He reports that he does not currently use alcohol. He reports that he does not use drugs.  Allergies  Allergen Reactions   Diphenhydramine Other (See Comments)    Severe restless legs    Family History  Problem Relation Age of Onset   Heart attack Father     Prior to Admission medications   Medication Sig Start Date End Date Taking? Authorizing Provider  acetaminophen (TYLENOL) 500 MG tablet Take 1,000 mg by mouth as needed for mild pain (pain score 1-3) or moderate pain (pain score 4-6).   Yes [provider]  apixaban (ELIQUIS) 2.5 MG TABS tablet Take 1 tablet (2.5 mg total) by mouth 2 (two) times daily. Patient taking differently: Take 2.5 mg by mouth daily. 08/31/22  Yes Croitoru, Mihai, MD  diazepam (VALIUM) 2 MG tablet TAKE 1-2 TABLETS BY MOUTH EVERY 8 HOURS AS NEEDED FOR ANXIETY Patient taking differently: Take 2 mg by mouth 2 (two) times daily as needed for anxiety. 10/21/22  Yes Pickenpack-Cousar, Arty Baumgartner, NP  diltiazem (CARDIZEM CD) 240 MG 24 hr capsule  Take 1 capsule (240 mg total) by mouth in the morning. 03/26/22  Yes Croitoru, Mihai, MD  famotidine (PEPCID) 20 MG tablet Take 20 mg by mouth daily. 09/15/22  Yes [provider]  fluticasone (FLONASE) 50 MCG/ACT nasal spray Place 1 spray into both nostrils as needed for allergies. 01/20/22  Yes [provider]  furosemide (LASIX) 20 MG tablet Take 1 tablet (20 mg total) by mouth daily with breakfast for 5 days. 12/18/22 12/23/22 Yes Walisiewicz, Kaitlyn E, PA-C  hydrocortisone (CORTEF) 20 MG tablet Take 20 mg by mouth in the morning.   Yes [provider]  hydrocortisone (CORTEF) 5 MG tablet Take by mouth 15 mg in am and 5 mg in pm Patient taking differently: Take 25 mg by mouth at bedtime. 08/03/22  Yes Carlus Pavlov, MD  levothyroxine (SYNTHROID) 150 MCG tablet Take 1 tablet (150 mcg total) by mouth daily before breakfast. 08/03/22  Yes Carlus Pavlov, MD  loratadine (CLARITIN) 10 MG tablet  Take 10 mg by mouth daily.   Yes [provider]  megestrol (MEGACE) 20 MG tablet TAKE 1 TABLET BY MOUTH DAILY Patient taking differently: Take 20 mg by mouth in the morning. 10/21/22  Yes Heilingoetter, Cassandra L, PA-C  Multiple Vitamins-Minerals (CENTRUM SILVER 50+MEN) TABS Take 1 tablet by mouth at bedtime.   Yes [provider]  mupirocin ointment (BACTROBAN) 2 % Place 1 Application into the nose daily as needed (sores).   Yes [provider]  posaconazole (NOXAFIL) 100 MG TBEC delayed-release tablet Take 3 tablets (300 mg total) by mouth daily. 10/22/22  Yes Danelle Earthly, MD  potassium chloride 20 MEQ/15ML (10%) SOLN Take 30 mLs (40 mEq total) by mouth daily for 4 days. 12/11/22 12/20/22 Yes Lewie Chamber, MD  VITAMIN D PO Take 1 capsule by mouth at bedtime.   Yes [provider]  axitinib (INLYTA) 1 MG tablet Take by mouth 2 (two) times daily. Patient not taking: Reported on 12/20/2022    [provider]  belzutifan [facility]) 40 MG  tablet Take 3 tablets (120 mg total) by mouth daily. Patient not taking: Reported on 12/08/2022 12/08/22   Si Gaul, MD  ezetimibe (ZETIA) 10 MG tablet Take 1 tablet (10 mg total) by mouth daily. Patient not taking: Reported on 12/08/2022 03/27/22   Croitoru, Rachelle Hora, MD  traZODone (DESYREL) 150 MG tablet Take 150 mg by mouth at bedtime as needed for sleep. Patient not taking: Reported on 12/20/2022    [provider]    Physical Exam: Vitals:   12/20/22 1300 12/20/22 1330 12/20/22 1400 12/20/22 1401  BP: (!) 153/110 (!) 163/103 (!) 150/91 (!) 158/82  Pulse: 100 (!) 169 99 (!) 102  Resp: 15 (!) 26 17 (!) 21  Temp:    98.6 F (37 C)  TempSrc:    Axillary  SpO2: 94% 97% 91% 91%   Physical Exam Vitals and nursing note reviewed.  Constitutional:      General: He is awake. He is not in acute distress.    Appearance: Normal appearance. He is overweight. He is ill-appearing.  HENT:     Head: Normocephalic.     Nose: No rhinorrhea.     Mouth/Throat:     Mouth: Mucous membranes are moist.  Eyes:     General: No scleral icterus.    Pupils: Pupils are equal, round, and reactive to light.  Neck:     Vascular: No JVD.  Cardiovascular:     Rate and Rhythm: Normal rate.     Heart sounds: S1 normal and S2 normal.  Pulmonary:     Effort: Pulmonary effort is normal. Tachypnea present. No accessory muscle usage or respiratory distress.     Breath sounds: Examination of the right-lower field reveals decreased breath sounds. Examination of the left-lower field reveals rales. Decreased breath sounds and rales present. No wheezing or rhonchi.  Abdominal:     General: Bowel sounds are normal. There is no distension.     Palpations: Abdomen is soft.     Tenderness: There is no abdominal tenderness.  Musculoskeletal:     Cervical back: Neck supple.     Right lower leg: 2+ Pitting Edema present.     Left lower leg: 2+ Pitting Edema present.  Skin:    General: Skin is warm and dry.   Neurological:     General: No focal deficit present.     Mental Status: He is alert and oriented to person, place, and time.  Psychiatric:  Mood and Affect: Mood normal.        Behavior: Behavior normal. Behavior is cooperative.     Data Reviewed:  Results are pending, will review when available.  January/2024 transthoracic echocardiogram IMPRESSIONS:   1. Challenging windows. Left ventricular ejection fraction, by  estimation, is 60 to 65%. The left ventricle has normal function. The left  ventricle has no regional wall motion abnormalities. Left ventricular  diastolic parameters are consistent with  Grade I diastolic dysfunction (impaired relaxation).   2. Right ventricular systolic function is normal. The right ventricular  size is normal. Tricuspid regurgitation signal is inadequate for assessing  PA pressure.   3. No evidence of mitral valve regurgitation.   4. The aortic valve was not well visualized. Aortic valve regurgitation  is not visualized.   5. The inferior vena cava is normal in size with greater than 50%  respiratory variability, suggesting right atrial pressure of 3 mmHg.   Assessment and Plan: Principal Problem:   Syncope Observation/telemetry. Continue IV fluids. Correct electrolyte abnormality. Check echocardiogram. Consult cardiology in AM.  Active Problems:   Frequent falls  Concerning while on anticoagulation.     Coronary artery disease On apixaban.  Off Zetia.     Paroxysmal atrial fibrillation CHA?DS?-VASc Score of at least 5. Continue diltiazem 240 mg p.o. daily. Continue apixaban 2.5 mg p.o. twice daily. Will determine with cardiology pros and cons of anticoagulation.    Grade I diastolic dysfunction  Received a liter IV fluids earlier. BNP is elevated. Will give another dose of oral potassium now. Furosemide 20 mg IVP x 1 around 2000. -Then KCl 40 mill equivalents p.o. bedtime.    Hypokalemia Secondary to furosemide  use. Needs regular replacement.    Hypomagnesemia  Secondary to diuretic. Discussed with the daughter and the need for regular supplement.    Protein-calorie malnutrition, severe In the setting of anemia and malignancy. May benefit from protein supplementation. Consider nutritional services evaluation. Follow-up albumin level.    Adrenal insufficiency (HCC) Continue hydrocortisone 20 mg in AM. Continue hydrocortisone 5 mg in p.m. Per last endocrinology endocrinology AM dose to be tapered to 15 mg. -However, it seems like he has been taking 20 mg. -Will defer taper after discharge when he is in less physical stress.    Invasive fungal sinusitis Continue posaconazole 300 mg p.o. daily. Follow-up with infectious diseases as an outpatient.     Hyperlipidemia Atherosclerosis of aorta  off ezetimibe. Follow-up with PCP.     Essential hypertension Continue Cardizem 240 mg p.o. daily.     Acquired hypothyroidism Continue levothyroxine 150 mcg p.o. daily.     Anemia of chronic disease Transfuse 1 unit in ED. Follow-up hematocrit and hemoglobin.     Thrombocytosis In the setting of anemia. Follow-up platelet count.    GERD (gastroesophageal reflux disease) Antiacid, H2 blocker or PPI as needed.    Advance Care Planning:   Code Status: Full Code   Consults: Cardiology consult requested for Monday morning.  Family Communication: His daughter was at bedside.  Severity of Illness: The appropriate patient status for this patient is OBSERVATION. Observation status is judged to be reasonable and necessary in order to provide the required intensity of service to ensure the patient's safety. The patient's presenting symptoms, physical exam findings, and initial radiographic and laboratory data in the context of their medical condition is felt to place them at decreased risk for further clinical deterioration. Furthermore, it is anticipated that the patient will be medically stable  for discharge from the hospital within 2 midnights of admission.   Author: Bobette Mo, MD 12/20/2022 4:20 PM  For on call review [email or web address].   This document was prepared using Dragon voice recognition software and may contain some unintended transcription errors.

## 2022-12-20 NOTE — ED Provider Notes (Signed)
[facility] [place] [facility] Provider Note   CSN: [id number] Arrival date & time: 12/20/22  4098     History Chief Complaint  Patient presents with   Fall   Head Injury    [name] is a 77 y.o. male with h/o  osteoarthritis, stage IIIa chronic kidney disease, right nephrectomy, history of renal cell carcinoma, history of lung cancer, no metastasis, history of multiple episodes of pneumonia, CAD, GERD, heart murmur, hyperlipidemia, hypertension, hypothyroidism, incisional hernia, secondary adrenal insufficiency presents to the ER for evaluation of multiple falls. The patient comes accompanied by daughter. She reports that he has fallen at least 3 times in the past day. She reports that usually he is sitting. One of the times, the patient reports he was getting out of bed and the next thing he knew, he was on the floor. He doesn't know if he is having cp, SOB, or lightheadedness before the episodes. Earlier today, the daughter reports he was sitting on the commode, when she heard a noise and found him sitting, leaning over on the commode. Unsure if he is experiencing syncopal episodes. The patient is on Elqiuis. Daughter reports that he has had a mental decline in the past 4-6 weeks, but reports he is at his new baseline.    Fall Pertinent negatives include no chest pain and no shortness of breath.  Head Injury Associated symptoms: no neck pain        Home Medications Prior to Admission medications   Medication Sig Start Date End Date Taking? Authorizing Provider  acetaminophen (TYLENOL) 500 MG tablet Take 1,000 mg by mouth as needed for mild pain (pain score 1-3) or moderate pain (pain score 4-6).   Yes [provider]  apixaban (ELIQUIS) 2.5 MG TABS tablet Take 1 tablet (2.5 mg total) by mouth 2 (two) times daily. Patient taking differently: Take 2.5 mg by mouth daily. 08/31/22  Yes Croitoru, Mihai, MD  diazepam (VALIUM) 2 MG tablet TAKE 1-2 TABLETS BY  MOUTH EVERY 8 HOURS AS NEEDED FOR ANXIETY Patient taking differently: Take 2 mg by mouth 2 (two) times daily as needed for anxiety. 10/21/22  Yes Pickenpack-Cousar, Arty Baumgartner, NP  diltiazem (CARDIZEM CD) 240 MG 24 hr capsule Take 1 capsule (240 mg total) by mouth in the morning. 03/26/22  Yes Croitoru, Mihai, MD  famotidine (PEPCID) 20 MG tablet Take 20 mg by mouth daily. 09/15/22  Yes [provider]  fluticasone (FLONASE) 50 MCG/ACT nasal spray Place 1 spray into both nostrils as needed for allergies. 01/20/22  Yes [provider]  furosemide (LASIX) 20 MG tablet Take 1 tablet (20 mg total) by mouth daily with breakfast for 5 days. 12/18/22 12/23/22 Yes Walisiewicz, Kaitlyn E, PA-C  hydrocortisone (CORTEF) 20 MG tablet Take 20 mg by mouth in the morning.   Yes [provider]  hydrocortisone (CORTEF) 5 MG tablet Take by mouth 15 mg in am and 5 mg in pm Patient taking differently: Take 5 mg by mouth See admin instructions. Daily at 3pm. 08/03/22  Yes Carlus Pavlov, MD  levothyroxine (SYNTHROID) 150 MCG tablet Take 1 tablet (150 mcg total) by mouth daily before breakfast. 08/03/22  Yes Carlus Pavlov, MD  loratadine (CLARITIN) 10 MG tablet Take 10 mg by mouth daily.   Yes [provider]  megestrol (MEGACE) 20 MG tablet TAKE 1 TABLET BY MOUTH DAILY Patient taking differently: Take 20 mg by mouth in the morning. 10/21/22  Yes Heilingoetter, Cassandra L, PA-C  Multiple Vitamins-Minerals (CENTRUM SILVER 50+MEN) TABS Take 1 tablet by mouth at bedtime.   Yes [provider]  mupirocin ointment (BACTROBAN) 2 % Place 1 Application into the nose daily as needed (sores).   Yes [provider]  posaconazole (NOXAFIL) 100 MG TBEC delayed-release tablet Take 3 tablets (300 mg total) by mouth daily. 10/22/22  Yes Danelle Earthly, MD  potassium chloride 20 MEQ/15ML (10%) SOLN Take 30 mLs (40 mEq total) by mouth daily for 4 days. 12/11/22 12/20/22 Yes Lewie Chamber, MD   VITAMIN D PO Take 1 capsule by mouth at bedtime.   Yes [provider]  axitinib (INLYTA) 1 MG tablet Take by mouth 2 (two) times daily. Patient not taking: Reported on 12/20/2022    [provider]  belzutifan [facility]) 40 MG tablet Take 3 tablets (120 mg total) by mouth daily. Patient not taking: Reported on 12/08/2022 12/08/22   Si Gaul, MD  ezetimibe (ZETIA) 10 MG tablet Take 1 tablet (10 mg total) by mouth daily. Patient not taking: Reported on 12/08/2022 03/27/22   Croitoru, Rachelle Hora, MD  traZODone (DESYREL) 150 MG tablet Take 150 mg by mouth at bedtime as needed for sleep. Patient not taking: Reported on 12/20/2022    [provider]      Allergies    Diphenhydramine    Review of Systems   Review of Systems  Constitutional:  Positive for fever.  Respiratory:  Negative for shortness of breath.   Cardiovascular:  Positive for leg swelling. Negative for chest pain.  Musculoskeletal:  Negative for arthralgias, back pain, myalgias and neck pain.    Physical Exam Updated Vital Signs BP (!) 144/76 (BP Location: Right Arm)   Pulse 95   Temp 97.8 F (36.6 C) (Oral)   Resp 16   SpO2 100%  Physical Exam Vitals and nursing note reviewed.  Constitutional:      Appearance: He is not toxic-appearing.  HENT:     Head:     Comments: Hematoma seen to the posterior head and between the crown and the occiput.  No step-offs or deformities.  Patient also has bruising noted to the right frontal forehead.  Abrasion noted to the top of the scalp.  No raccoon eyes or battle signs.    Right Ear: Tympanic membrane, ear canal and external ear normal.     Left Ear: Tympanic membrane, ear canal and external ear normal.     Mouth/Throat:     Mouth: Mucous membranes are dry.  Eyes:     General: No scleral icterus.    Extraocular Movements: Extraocular movements intact.     Pupils: Pupils are equal, round, and reactive to light.  Neck:     Comments: No midline  tenderness to palpation. No step off or deformities.  Cardiovascular:     Pulses: Normal pulses.  Pulmonary:     Effort: Pulmonary effort is normal. No respiratory distress.  Abdominal:     Palpations: Abdomen is soft.     Tenderness: There is no abdominal tenderness. There is no guarding or rebound.     Comments: No overlying skin changes or signs of trauma.  Soft nontender  Musculoskeletal:     Cervical back: No rigidity.     Right lower leg: Edema present.     Left lower leg: Edema present.     Comments: 2+ pitting edema bilaterally  Skin:    General: Skin is warm and dry.  Neurological:     General: No focal deficit present.  Mental Status: He is alert and oriented to person, place, and time. Mental status is at baseline.     ED Results / Procedures / Treatments   Labs (all labs ordered are listed, but only abnormal results are displayed) Labs Reviewed  COMPREHENSIVE METABOLIC PANEL - Abnormal; Notable for the following components:      Result Value   Potassium 2.4 (*)    Chloride 94 (*)    Glucose, Bld 204 (*)    Calcium 8.0 (*)    Total Protein 6.4 (*)    Albumin 2.3 (*)    AST 13 (*)    Anion gap 18 (*)    All other components within normal limits  CBC WITH DIFFERENTIAL/PLATELET - Abnormal; Notable for the following components:   WBC 16.3 (*)    RBC 3.99 (*)    Hemoglobin 9.6 (*)    HCT 33.0 (*)    MCH 24.1 (*)    MCHC 29.1 (*)    RDW 19.5 (*)    Platelets 477 (*)    Neutro Abs 13.3 (*)    Monocytes Absolute 1.3 (*)    Abs Immature Granulocytes 0.18 (*)    All other components within normal limits  URINALYSIS, ROUTINE W REFLEX MICROSCOPIC - Abnormal; Notable for the following components:   Glucose, UA 50 (*)    Protein, ur 100 (*)    Bacteria, UA RARE (*)    All other components within normal limits  BRAIN NATRIURETIC PEPTIDE - Abnormal; Notable for the following components:   B Natriuretic Peptide 569.6 (*)    All other components within normal  limits  MAGNESIUM - Abnormal; Notable for the following components:   Magnesium 1.5 (*)    All other components within normal limits  BASIC METABOLIC PANEL - Abnormal; Notable for the following components:   Potassium 3.4 (*)    Chloride 95 (*)    Glucose, Bld 167 (*)    BUN 7 (*)    Calcium 7.5 (*)    All other components within normal limits  TROPONIN I (HIGH SENSITIVITY) - Abnormal; Notable for the following components:   Troponin I (High Sensitivity) 24 (*)    All other components within normal limits  TROPONIN I (HIGH SENSITIVITY) - Abnormal; Notable for the following components:   Troponin I (High Sensitivity) 22 (*)    All other components within normal limits  CULTURE, BLOOD (ROUTINE X 2)  CULTURE, BLOOD (ROUTINE X 2) W REFLEX TO ID PANEL  LACTIC ACID, PLASMA  CBC  COMPREHENSIVE METABOLIC PANEL    EKG None  Radiology DG Pelvis Portable  Result Date: 12/20/2022 CLINICAL DATA:  Multiple falls EXAM: PORTABLE PELVIS 1-2 VIEWS COMPARISON:  11/27/2022 FINDINGS: There is no evidence of pelvic fracture or diastasis. No fracture or dislocation of the hips. No pelvic bone lesions are seen. IMPRESSION: Negative. Electronically Signed   By: Duanne Guess D.O.   On: 12/20/2022 18:53   CT Head Wo Contrast  Result Date: 12/20/2022 CLINICAL DATA:  Multiple falls including last night. Hit back of head. Posterior bruising and redness. EXAM: CT HEAD WITHOUT CONTRAST CT CERVICAL SPINE WITHOUT CONTRAST TECHNIQUE: Multidetector CT imaging of the head and cervical spine was performed following the standard protocol without intravenous contrast. Multiplanar CT image reconstructions of the cervical spine were also generated. RADIATION DOSE REDUCTION: This exam was performed according to the departmental dose-optimization program which includes automated exposure control, adjustment of the mA and/or kV according to patient size and/or use of  iterative reconstruction technique. COMPARISON:   11/27/2022 FINDINGS: CT HEAD FINDINGS Brain: There is no evidence for acute hemorrhage, hydrocephalus, mass lesion, or abnormal extra-axial fluid collection. No definite CT evidence for acute infarction. Diffuse loss of parenchymal volume is consistent with atrophy. Patchy low attenuation in the deep hemispheric and periventricular white matter is nonspecific, but likely reflects chronic microvascular ischemic demyelination. Vascular: No hyperdense vessel or unexpected calcification. Skull: No evidence for fracture. No worrisome lytic or sclerotic lesion. Sinuses/Orbits: The visualized paranasal sinuses and mastoid air cells are clear. Visualized portions of the globes and intraorbital fat are unremarkable. Other: None. CT CERVICAL SPINE FINDINGS Alignment: No evidence for traumatic subluxation. Skull base and vertebrae: No acute fracture. No primary bone lesion or focal pathologic process. Soft tissues and spinal canal: No prevertebral fluid or swelling. No visible canal hematoma. Disc levels: Mild loss of disc height noted C6-7 with endplate spurring. Facet degeneration noted on the right at C2-3 and C3-4. Upper chest: Unremarkable. Other: None IMPRESSION: 1. No acute intracranial abnormality. 2. Atrophy with chronic small vessel white matter ischemic disease. 3. No cervical spine fracture or traumatic subluxation. Electronically Signed   By: Kennith Center M.D.   On: 12/20/2022 11:37   CT Cervical Spine Wo Contrast  Result Date: 12/20/2022 CLINICAL DATA:  Multiple falls including last night. Hit back of head. Posterior bruising and redness. EXAM: CT HEAD WITHOUT CONTRAST CT CERVICAL SPINE WITHOUT CONTRAST TECHNIQUE: Multidetector CT imaging of the head and cervical spine was performed following the standard protocol without intravenous contrast. Multiplanar CT image reconstructions of the cervical spine were also generated. RADIATION DOSE REDUCTION: This exam was performed according to the departmental  dose-optimization program which includes automated exposure control, adjustment of the mA and/or kV according to patient size and/or use of iterative reconstruction technique. COMPARISON:  11/27/2022 FINDINGS: CT HEAD FINDINGS Brain: There is no evidence for acute hemorrhage, hydrocephalus, mass lesion, or abnormal extra-axial fluid collection. No definite CT evidence for acute infarction. Diffuse loss of parenchymal volume is consistent with atrophy. Patchy low attenuation in the deep hemispheric and periventricular white matter is nonspecific, but likely reflects chronic microvascular ischemic demyelination. Vascular: No hyperdense vessel or unexpected calcification. Skull: No evidence for fracture. No worrisome lytic or sclerotic lesion. Sinuses/Orbits: The visualized paranasal sinuses and mastoid air cells are clear. Visualized portions of the globes and intraorbital fat are unremarkable. Other: None. CT CERVICAL SPINE FINDINGS Alignment: No evidence for traumatic subluxation. Skull base and vertebrae: No acute fracture. No primary bone lesion or focal pathologic process. Soft tissues and spinal canal: No prevertebral fluid or swelling. No visible canal hematoma. Disc levels: Mild loss of disc height noted C6-7 with endplate spurring. Facet degeneration noted on the right at C2-3 and C3-4. Upper chest: Unremarkable. Other: None IMPRESSION: 1. No acute intracranial abnormality. 2. Atrophy with chronic small vessel white matter ischemic disease. 3. No cervical spine fracture or traumatic subluxation. Electronically Signed   By: Kennith Center M.D.   On: 12/20/2022 11:37   DG Chest 2 View  Result Date: 12/20/2022 CLINICAL DATA:  Multiple falls over the past week.  Tachycardia. EXAM: CHEST - 2 VIEW COMPARISON:  Radiographs 12/08/2022 and 11/27/2022.  CT 11/04/2022. FINDINGS: 1049 hours. The heart size and mediastinal contours are stable. There is stable volume loss in the right hemithorax status post previous  right middle and lower lobectomy. Stable pleural thickening on the right. The lungs are clear. There is no pleural effusion or pneumothorax. No acute osseous findings are  seen. IMPRESSION: Stable chest status post right middle and lower lobectomy. No evidence of acute cardiopulmonary process. Electronically Signed   By: Carey Bullocks M.D.   On: 12/20/2022 11:12    Procedures Procedures   Medications Ordered in ED Medications  sodium chloride flush (NS) 0.9 % injection 3 mL (3 mLs Intravenous Given 12/20/22 2157)  acetaminophen (TYLENOL) tablet 650 mg (has no administration in time range)    Or  acetaminophen (TYLENOL) suppository 650 mg (has no administration in time range)  ondansetron (ZOFRAN) tablet 4 mg (has no administration in time range)    Or  ondansetron (ZOFRAN) injection 4 mg (has no administration in time range)  diltiazem (CARDIZEM CD) 24 hr capsule 240 mg (has no administration in time range)  posaconazole (NOXAFIL) delayed-release tablet 300 mg (300 mg Oral Given 12/20/22 1956)  megestrol (MEGACE) tablet 20 mg (has no administration in time range)  levothyroxine (SYNTHROID) tablet 150 mcg (has no administration in time range)  loratadine (CLARITIN) tablet 10 mg (has no administration in time range)  famotidine (PEPCID) tablet 20 mg (20 mg Oral Given 12/20/22 1957)  potassium chloride SA (KLOR-CON M) CR tablet 40 mEq (has no administration in time range)  apixaban (ELIQUIS) tablet 2.5 mg (2.5 mg Oral Given 12/20/22 2156)  diazepam (VALIUM) tablet 2 mg (has no administration in time range)  hydrocortisone (CORTEF) tablet 20 mg (has no administration in time range)  hydrocortisone (CORTEF) tablet 5 mg (5 mg Oral Given 12/20/22 1956)  lactated ringers bolus 1,000 mL (0 mLs Intravenous Stopped 12/20/22 1500)  potassium chloride SA (KLOR-CON M) CR tablet 40 mEq (40 mEq Oral Given 12/20/22 1422)  magnesium sulfate IVPB 2 g 50 mL (2 g Intravenous New Bag/Given 12/20/22 1607)  diltiazem  (CARDIZEM) injection 10 mg (10 mg Intravenous Given 12/20/22 2156)  potassium chloride SA (KLOR-CON M) CR tablet 40 mEq (40 mEq Oral Given 12/20/22 1846)  potassium chloride 10 MEQ/100ML IVPB (  Duplicate 12/20/22 1831)  furosemide (LASIX) injection 20 mg (20 mg Intravenous Given 12/20/22 1957)  potassium chloride SA (KLOR-CON M) CR tablet 40 mEq (40 mEq Oral Given 12/20/22 2156)    ED Course/ Medical Decision Making/ A&P   Medical Decision Making Amount and/or Complexity of Data Reviewed Labs: ordered. Radiology: ordered.  Risk Prescription drug management. Decision regarding hospitalization.   77 y.o. male presents to the ER for evaluation of multiple falls. Differential diagnosis includes but is not limited to trauma, lab abnormality, syncope. Vital signs mildly elevated BP, otherwise unremarkable. Physical exam as noted above.   On previous chart evaluation, the patient was admitted from 10/22-10/25/24 for dehydration/lactic acidosis, anemia. It appears he was recently infused with iron and blood last week as well. Has been admitted for electrolyte abnormalities in the past.   Given the head trauma, the patient was made a level 2. I have ordered CT head and c-spine. He denies any other pain. He is non-tender throughout on palpation.   I independently reviewed and interpreted the patient's labs.  Urinalysis shows 50 glucose present with 100 protein and rare bacteria but no white blood cells, red blood cells, leukocytes, or nitrites present.  CMP shows significant hypokalemia with potassium 2.4.  Glucose of 204.  Decrease in calcium, protein, albumin, and AST.  Anion gap of 18.  BNP is elevated at 569.6.  Lactic acid within normal limits.  Magnesium low at 1.5.  Troponin at 22 with repeat at 24.  CBC does show significant leukocytosis at 16.3 however this  does appear to be chronic for the patient.  Elevated platelets of 477.  Anemia at 9.6 however appears to be around baseline if not  improved.  CT imaging:  1. No acute intracranial abnormality. 2. Atrophy with chronic small vessel white matter ischemic disease. 3. No cervical spine fracture or traumatic subluxation.  CXR: Stable chest status post right middle and lower lobectomy. No evidence of acute cardiopulmonary process.   Patient is given both oral and IV potassium replenishment as well as IV magnesium.  Unsure of the cause of his hypokalemia.  He is only had 1 dose of the Lasix.  Has 2+ pitting edema on exam.  He is not having any significant GI loss however his daughter reports he has had decreasing p.o. intake which could explain the gap as well.  Will order fluids.  I question of the patient's having mechanical falls versus syncope.  Given that he is usually sitting down for them and his less likely given he is not standing with these.  Given his electrolyte abnormalities alone, do think is beneficial for admission and further evaluation with things such as echocardiogram, etc. I discussed with family members and patient at bedside were amenable.  Admit to hospitalist.  Portions of this report may have been transcribed using voice recognition software. Every effort was made to ensure accuracy; however, inadvertent computerized transcription errors may be present.   Final Clinical Impression(s) / ED Diagnoses Final diagnoses:  Frequent falls  Injury of head, initial encounter  Hypokalemia  Hypomagnesemia    Rx / DC Orders ED Discharge Orders     None         Achille Rich, PA-C 12/20/22 2324    Tegeler, Canary Brim, MD 12/22/22 [phone]

## 2022-12-20 NOTE — ED Notes (Signed)
Patient transported to CT 

## 2022-12-20 NOTE — ED Provider Triage Note (Signed)
Emergency Medicine Provider Triage Evaluation Note  [name] , a 77 y.o. male  was evaluated in triage.  Pt complains of multiple falls over the past few days. On Wednesday, the patient fell forward form the commode. Last night he fell getting out of bed and hit the back of his head. He also went sideways off the commode earlier today. The patient is on Eliquis. He denies any chest pain, SOB, or lightheadedness/dizziness before falling. Family at bedside reports he has a a decline over the past 4-6 weeks, but is at his baseline during this time.   Review of Systems  Positive:  Negative:   Physical Exam  BP (!) 144/81 (BP Location: Left Arm)   Pulse (!) 110   Temp 98.6 F (37 C) (Oral)   Resp 18   SpO2 96%  Gen:   Awake, no distress   Resp:  Normal effort  MSK:   Moves extremities without difficulty Other:  Bruising to the back of the head and right frontal forehead. Diagonal mark to the top of the scalp as well.   Medical Decision Making  Medically screening exam initiated at 10:28 AM.  Appropriate orders placed.  [name] was informed that the remainder of the evaluation will be completed by another provider, this initial triage assessment does not replace that evaluation, and the importance of remaining in the ED until their evaluation is complete.  Head CT and C-spine with labs ordered. The patient is on Eliquis and was originally triaged as a level 3. Triage nurse and charge nurse alerted that this needs to be upgraded to a level 2 trauma.    Alex Rich, PA-C 12/20/22 1036

## 2022-12-20 NOTE — ED Notes (Signed)
Unsuccessful IV attempt x2.  Will enter IV team consult.

## 2022-12-21 ENCOUNTER — Other Ambulatory Visit: Payer: Self-pay

## 2022-12-21 ENCOUNTER — Inpatient Hospital Stay: Payer: Medicare HMO | Admitting: Physician Assistant

## 2022-12-21 ENCOUNTER — Inpatient Hospital Stay: Payer: Medicare HMO

## 2022-12-21 ENCOUNTER — Observation Stay (HOSPITAL_BASED_OUTPATIENT_CLINIC_OR_DEPARTMENT_OTHER): Payer: Medicare HMO

## 2022-12-21 ENCOUNTER — Encounter (HOSPITAL_COMMUNITY): Payer: Self-pay | Admitting: Internal Medicine

## 2022-12-21 DIAGNOSIS — R296 Repeated falls: Secondary | ICD-10-CM | POA: Diagnosis not present

## 2022-12-21 DIAGNOSIS — E876 Hypokalemia: Secondary | ICD-10-CM

## 2022-12-21 DIAGNOSIS — R55 Syncope and collapse: Secondary | ICD-10-CM

## 2022-12-21 DIAGNOSIS — S0990XA Unspecified injury of head, initial encounter: Secondary | ICD-10-CM | POA: Diagnosis not present

## 2022-12-21 LAB — CBC
HCT: 31.2 % — ABNORMAL LOW (ref 39.0–52.0)
Hemoglobin: 8.7 g/dL — ABNORMAL LOW (ref 13.0–17.0)
MCH: 23.7 pg — ABNORMAL LOW (ref 26.0–34.0)
MCHC: 27.9 g/dL — ABNORMAL LOW (ref 30.0–36.0)
MCV: 85 fL (ref 80.0–100.0)
Platelets: 394 10*3/uL (ref 150–400)
RBC: 3.67 MIL/uL — ABNORMAL LOW (ref 4.22–5.81)
RDW: 19.3 % — ABNORMAL HIGH (ref 11.5–15.5)
WBC: 11.5 10*3/uL — ABNORMAL HIGH (ref 4.0–10.5)
nRBC: 0 % (ref 0.0–0.2)

## 2022-12-21 LAB — ECHOCARDIOGRAM COMPLETE
AR max vel: 2.2 cm2
AV Peak grad: 5 mm[Hg]
Ao pk vel: 1.12 m/s
Area-P 1/2: 6.27 cm2
MV M vel: 2.18 m/s
MV Peak grad: 19 mm[Hg]
S' Lateral: 3.7 cm
Weight: 2500.9 [oz_av]

## 2022-12-21 LAB — COMPREHENSIVE METABOLIC PANEL
ALT: 11 U/L (ref 0–44)
AST: 10 U/L — ABNORMAL LOW (ref 15–41)
Albumin: 2 g/dL — ABNORMAL LOW (ref 3.5–5.0)
Alkaline Phosphatase: 113 U/L (ref 38–126)
Anion gap: 12 (ref 5–15)
BUN: 7 mg/dL — ABNORMAL LOW (ref 8–23)
CO2: 32 mmol/L (ref 22–32)
Calcium: 7.6 mg/dL — ABNORMAL LOW (ref 8.9–10.3)
Chloride: 91 mmol/L — ABNORMAL LOW (ref 98–111)
Creatinine, Ser: 0.7 mg/dL (ref 0.61–1.24)
GFR, Estimated: >60 mL/min (ref >60)
Glucose, Bld: 145 mg/dL — ABNORMAL HIGH (ref 70–99)
Potassium: 3.4 mmol/L — ABNORMAL LOW (ref 3.5–5.1)
Sodium: 135 mmol/L (ref 135–145)
Total Bilirubin: 0.6 mg/dL (ref <1.2)
Total Protein: 5.6 g/dL — ABNORMAL LOW (ref 6.5–8.1)

## 2022-12-21 LAB — GLUCOSE, CAPILLARY: Glucose-Capillary: 132 mg/dL — ABNORMAL HIGH (ref 70–99)

## 2022-12-21 MED ORDER — METOPROLOL TARTRATE 25 MG PO TABS
25.0000 mg | ORAL_TABLET | Freq: Two times a day (BID) | ORAL | Status: DC
  Administered 2022-12-21 – 2022-12-22 (×3): 25 mg via ORAL
  Filled 2022-12-21 (×3): qty 1

## 2022-12-21 MED ORDER — PERFLUTREN LIPID MICROSPHERE
1.0000 mL | INTRAVENOUS | Status: AC | PRN
  Administered 2022-12-21: 4 mL via INTRAVENOUS

## 2022-12-21 MED ORDER — POTASSIUM CHLORIDE CRYS ER 20 MEQ PO TBCR
40.0000 meq | EXTENDED_RELEASE_TABLET | Freq: Two times a day (BID) | ORAL | Status: DC
  Administered 2022-12-22: 40 meq via ORAL

## 2022-12-21 MED ORDER — FUROSEMIDE 10 MG/ML IJ SOLN
20.0000 mg | Freq: Once | INTRAMUSCULAR | Status: AC
  Administered 2022-12-21: 20 mg via INTRAVENOUS
  Filled 2022-12-21: qty 2

## 2022-12-21 MED ORDER — POTASSIUM CHLORIDE CRYS ER 20 MEQ PO TBCR
40.0000 meq | EXTENDED_RELEASE_TABLET | Freq: Once | ORAL | Status: AC
  Administered 2022-12-21: 40 meq via ORAL
  Filled 2022-12-21: qty 2

## 2022-12-21 MED ORDER — ENSURE ENLIVE PO LIQD
237.0000 mL | Freq: Two times a day (BID) | ORAL | Status: DC
  Administered 2022-12-21 – 2022-12-22 (×2): 237 mL via ORAL

## 2022-12-21 NOTE — Progress Notes (Signed)
Mobility Specialist - Progress Note   12/21/22 1300  Mobility  Activity Transferred from bed to chair  Level of Assistance Minimal assist, patient does 75% or more  Assistive Device Front wheel walker  Range of Motion/Exercises Active  Activity Response Tolerated well  Mobility Referral Yes  $Mobility charge 1 Mobility  Mobility Specialist Start Time (ACUTE ONLY) 1245  Mobility Specialist Stop Time (ACUTE ONLY) 1252  Mobility Specialist Time Calculation (min) (ACUTE ONLY) 7 min   Received in bed and requesting mobility. Advised to only transfer due to recent falls due to syncopal episodes, pt agreed.  IND for bed mobility, Min A for STS. Small shuffling steps to chair where pt was left with all needs met. Alarm on and family in room.  Marilynne Halsted Mobility Specialist

## 2022-12-21 NOTE — Plan of Care (Signed)
  Problem: Education: Goal: Knowledge of condition and prescribed therapy will improve Outcome: Progressing   Problem: Cardiac: Goal: Will achieve and/or maintain adequate cardiac output Outcome: Progressing   Problem: Physical Regulation: Goal: Complications related to the disease process, condition or treatment will be avoided or minimized Outcome: Progressing   Problem: Clinical Measurements: Goal: Ability to maintain clinical measurements within normal limits will improve Outcome: Progressing   Problem: Pain Management: Goal: General experience of comfort will improve Outcome: Progressing   Problem: Safety: Goal: Ability to remain free from injury will improve Outcome: Progressing

## 2022-12-21 NOTE — Consult Note (Signed)
Cardiology Consultation   Patient ID: [name] MRN: [id number]; DOB: [birth year]  Admit date: 12/20/2022 Date of Consult: 12/21/2022  PCP:  [name], MD   [facility] Providers Cardiologist:  Thurmon Fair, MD     Patient Profile:   [name] is a 77 y.o. male with a hx of PAF on eliquis, stage IIIa chronic kidney disease, history of renal call carcinoma, history of lung cancer, right nephrectomy, CAD, GERD, HLD, HTN, hypothyroidism who is being seen 12/21/2022 for the evaluation of syncope/near syncope at the request of Dr. Dareen Piano.  History of Present Illness:   [name] is a 77 year old male with above medical history who is followed by Dr. Royann Shivers. Patient has metastatic clear-cell renal cancer with involvement of the lung, liver, omentum, retrocaval lymph notes s/p radiation therapy targeted to abdominal lymphadenopathy.   Patient previously underwent cardiac catheterization in 2010 that showed occlusion RCA with left-to-right collaterals, occluded first diagonal, 80% stenosis ramus intermedius, 50% stenosis proximal RCA, minor irregularities left main and LAD. Most recent ischemic evaluation was a nuclear stress test in 05/2017 that showed no evidence of ischemia.   Patient was seen in clinic on 08/11/21. At that time, patient reported that he had some palpitations after restarting chemotherapy earlier in 2023. His PCP ordered a heart monitor that reportedly showed some "fluttering". He was started on eliquis. However, patient reported that eliquis was started because he was very sick at the time and was at risk of forming a clot. Monitor results were not available for review at that time, so he remained on eliquis. Event monitor now available in chart- monitor from 06/28/21  appeared to show <1% burden of atrial fibrillation. Carotid ultrasounds from 07/2021 showed 1-39% stenosis in bilateral ICAs. Echocardiogram 03/02/22 showed EF 60-65%, no regional wall motion  abnormalities, grade I DD, normal RV function.   Patient was last seen by DR. Croitoru on 03/26/22. At that time, patient complained of right sided jaw pain. Pain worse with chewing. Felt that jaw pain was unlikely to be an anginal equivalent. Remained on ASA. Was not on statin therapy due to history of stain myopathy. There was some concern that jaw pain could represent claudication due to atherosclerotic carotid disease. Was referred to vascular surgery who suspected temporal arteritis. Underwent left temporal artery biopsy on 04/10/22 that showed no evidence of chronic inflammation  Patient presented to the ED on 11/3 reporting multiple falls, including a fall the night prior to presentation. Initial vital signs showed pulse 110 BPM, BP 144/81, oxygen 96% on room air. EKG was not obtained. Labs significant for K 2.4, glucose 204, calcium 8,0, mag 1.5, creatinine 0.77. WBC elevated to 16.3, hemoglobin 9.6, platelets 477. BNP elevated to 569.6. hsTn 22, 24.   CXR showed stable chest s/p right middle and lower lobectomy, no evidence of acute cardiopulmonary process. CT head and CT cervical spine without acute findings. Bood cultures ordered and pending.   Patient was given IV fluids, mag and K supplementation. Admitted to the internal medicine service for evaluation of possible syncope. Cardiology consulted to discuss risks/benefits of anticoagulation with frequent falls.   On interview, patient reports feeling OK today. Admits that he has been having recurrent falls recently, has had about 3-4 in the past month or so. Reports that he just "stands up and falls over". Denies syncope, near syncope. Denies palpitations, chest pain, shortness of breath prior to these falls. His daughter has been staying with him. He reports having  palpitations/fluttering in his chest throughout the day. Denies chest pain, shortness of breath.    Past Medical History:  Diagnosis Date   Arthritis    Chronic kidney disease     only has one kidney    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 02/01/2015   Coronary artery disease    followed by Dr.Tilley   Frequent PVCs    GERD (gastroesophageal reflux disease)    Heart murmur    never has caused any problems   Hx of cancer of lung 1980's   Hyperlipidemia    Hypertension    Hypothyroidism    Incisional hernia 08/01/2015   Lung cancer (HCC) 1993   Lung metastases 2019   Pneumonia    x several   Recurrent umbilical hernia 08/01/2015    Past Surgical History:  Procedure Laterality Date   APPENDECTOMY  age 1   ARTERY BIOPSY Left 04/10/2022   Procedure: LEFT TEMPORAL ARTERY BIOPSY;  Surgeon: Chuck Hint, MD;  Location: [facility] OR;  Service: Vascular;  Laterality: Left;   CHOLECYSTECTOMY  10/18/1978   EYE SURGERY Bilateral 2019   cataract   LAPAROSCOPIC LYSIS OF ADHESIONS N/A 08/01/2015   Procedure: LAPAROSCOPIC LYSIS OF ADHESIONS;  Surgeon: Karie Soda, MD;  Location: [facility] ORS;  Service: General;  Laterality: N/A;   LUNG LOBECTOMY  02/16/1990   lung cancer- patient has staples in lung not to have MRI per patient    MAXILLARY ANTROSTOMY Left 05/22/2022   Procedure: MAXILLARY ANTROSTOMY;  Surgeon: Laren Boom, DO;  Location: [facility] OR;  Service: ENT;  Laterality: Left;   PILONIDAL CYST EXCISION  02/16/2009   Dr Gerrit Friends   ROBOTIC ASSITED PARTIAL NEPHRECTOMY Right 02/01/2015   Procedure: RIGHT ROBOTIC ASSISTED LAPAROSOCOPY NEPHRECTOMY;  Surgeon: Crist Fat, MD;  Location: [facility] ORS;  Service: Urology;  Laterality: Right;   SINUS ENDO WITH FUSION Left 05/22/2022   Procedure: SINUS ENDO WITH FUSION;  Surgeon: Laren Boom, DO;  Location: [facility] OR;  Service: ENT;  Laterality: Left;   VENTRAL HERNIA REPAIR N/A 08/01/2015   Procedure: LAPAROSCOPIC VENTRAL WALL HERNIA WITH MESH;  Surgeon: Karie Soda, MD;  Location: [facility] ORS;  Service: General;  Laterality: N/A;     Home Medications:  Prior to Admission medications    Medication Sig Start Date End Date Taking? Authorizing Provider  acetaminophen (TYLENOL) 500 MG tablet Take 1,000 mg by mouth as needed for mild pain (pain score 1-3) or moderate pain (pain score 4-6).   Yes [provider]  apixaban (ELIQUIS) 2.5 MG TABS tablet Take 1 tablet (2.5 mg total) by mouth 2 (two) times daily. Patient taking differently: Take 2.5 mg by mouth daily. 08/31/22  Yes Croitoru, Mihai, MD  diazepam (VALIUM) 2 MG tablet TAKE 1-2 TABLETS BY MOUTH EVERY 8 HOURS AS NEEDED FOR ANXIETY Patient taking differently: Take 2 mg by mouth 2 (two) times daily as needed for anxiety. 10/21/22  Yes Pickenpack-Cousar, Arty Baumgartner, NP  diltiazem (CARDIZEM CD) 240 MG 24 hr capsule Take 1 capsule (240 mg total) by mouth in the morning. 03/26/22  Yes Croitoru, Mihai, MD  famotidine (PEPCID) 20 MG tablet Take 20 mg by mouth daily. 09/15/22  Yes [provider]  fluticasone (FLONASE) 50 MCG/ACT nasal spray Place 1 spray into both nostrils as needed for allergies. 01/20/22  Yes [provider]  furosemide (LASIX) 20 MG tablet Take 1 tablet (20 mg total) by mouth daily with breakfast for 5 days. 12/18/22 12/23/22 Yes  Walisiewicz, Kaitlyn E, PA-C  hydrocortisone (CORTEF) 20 MG tablet Take 20 mg by mouth in the morning.   Yes [provider]  hydrocortisone (CORTEF) 5 MG tablet Take by mouth 15 mg in am and 5 mg in pm Patient taking differently: Take 5 mg by mouth See admin instructions. Daily at 3pm. 08/03/22  Yes Carlus Pavlov, MD  levothyroxine (SYNTHROID) 150 MCG tablet Take 1 tablet (150 mcg total) by mouth daily before breakfast. 08/03/22  Yes Carlus Pavlov, MD  loratadine (CLARITIN) 10 MG tablet Take 10 mg by mouth daily.   Yes [provider]  megestrol (MEGACE) 20 MG tablet TAKE 1 TABLET BY MOUTH DAILY Patient taking differently: Take 20 mg by mouth in the morning. 10/21/22  Yes Heilingoetter, Cassandra L, PA-C  Multiple Vitamins-Minerals (CENTRUM SILVER  50+MEN) TABS Take 1 tablet by mouth at bedtime.   Yes [provider]  mupirocin ointment (BACTROBAN) 2 % Place 1 Application into the nose daily as needed (sores).   Yes [provider]  posaconazole (NOXAFIL) 100 MG TBEC delayed-release tablet Take 3 tablets (300 mg total) by mouth daily. 10/22/22  Yes Danelle Earthly, MD  potassium chloride 20 MEQ/15ML (10%) SOLN Take 30 mLs (40 mEq total) by mouth daily for 4 days. 12/11/22 12/20/22 Yes Lewie Chamber, MD  VITAMIN D PO Take 1 capsule by mouth at bedtime.   Yes [provider]  axitinib (INLYTA) 1 MG tablet Take by mouth 2 (two) times daily. Patient not taking: Reported on 12/20/2022    [provider]  belzutifan [facility]) 40 MG tablet Take 3 tablets (120 mg total) by mouth daily. Patient not taking: Reported on 12/08/2022 12/08/22   Si Gaul, MD  ezetimibe (ZETIA) 10 MG tablet Take 1 tablet (10 mg total) by mouth daily. Patient not taking: Reported on 12/08/2022 03/27/22   Croitoru, Rachelle Hora, MD  traZODone (DESYREL) 150 MG tablet Take 150 mg by mouth at bedtime as needed for sleep. Patient not taking: Reported on 12/20/2022    [provider]    Inpatient Medications: Scheduled Meds:  apixaban  2.5 mg Oral BID   diltiazem  240 mg Oral Daily   famotidine  20 mg Oral Daily   hydrocortisone  20 mg Oral Daily   hydrocortisone  5 mg Oral Daily   levothyroxine  150 mcg Oral QAC breakfast   loratadine  10 mg Oral Daily   megestrol  20 mg Oral Daily   posaconazole  300 mg Oral Daily   potassium chloride  40 mEq Oral Daily   sodium chloride flush  3 mL Intravenous Q12H   Continuous Infusions:  PRN Meds: acetaminophen **OR** acetaminophen, diazepam, ondansetron **OR** ondansetron (ZOFRAN) IV  Allergies:    Allergies  Allergen Reactions   Diphenhydramine Other (See Comments)    Severe restless legs    Social History:   Social History   Socioeconomic History   Marital status: Widowed     Spouse name: Not on file   Number of children: 2   Years of education: Not on file   Highest education level: Not on file  Occupational History   Occupation: Retired  Tobacco Use   Smoking status: Former    Current packs/day: 0.00    Average packs/day: 1 pack/day for 20.0 years (20.0 ttl pk-yrs)    Types: Cigarettes    Start date: 01/29/1968    Quit date: 01/29/1988    Years since quitting: 34.9   Smokeless tobacco: Never  Vaping Use  Vaping status: Never Used  Substance and Sexual Activity   Alcohol use: Not Currently    Comment: rare   Drug use: No   Sexual activity: Not Currently  Other Topics Concern   Not on file  Social History Narrative   Not on file   Social Determinants of Health   Financial Resource Strain: Not on file  Food Insecurity: No Food Insecurity (12/21/2022)   Hunger Vital Sign    Worried About Running Out of Food in the Last Year: Never true    Ran Out of Food in the Last Year: Never true  Transportation Needs: No Transportation Needs (12/21/2022)   PRAPARE - Administrator, Civil Service (Medical): No    Lack of Transportation (Non-Medical): No  Physical Activity: Not on file  Stress: Not on file  Social Connections: Unknown (06/30/2021)   Received from [facility], [facility]   Social Network    Social Network: Not on file  Intimate Partner Violence: Not At Risk (12/21/2022)   Humiliation, Afraid, Rape, and Kick questionnaire    Fear of Current or Ex-Partner: No    Emotionally Abused: No    Physically Abused: No    Sexually Abused: No    Family History:    Family History  Problem Relation Age of Onset   Heart attack Father      ROS:  Please see the history of present illness.   All other ROS reviewed and negative.     Physical Exam/Data:   Vitals:   12/20/22 2245 12/21/22 0224 12/21/22 0500 12/21/22 0609  BP: (!) 144/76 (!) 150/80  (!) 142/85  Pulse: 95 93  97  Resp:      Temp: 97.8 F (36.6 C) 97.9 F (36.6  C)  97.8 F (36.6 C)  TempSrc: Oral   Oral  SpO2: 100% 100%  100%  Weight:   70.9 kg     Intake/Output Summary (Last 24 hours) at 12/21/2022 0910 Last data filed at 12/20/2022 2255 Gross per 24 hour  Intake 240 ml  Output 1200 ml  Net -960 ml      12/21/2022    5:00 AM 12/08/2022   11:46 AM 12/08/2022    9:56 AM  Last 3 Weights  Weight (lbs) 156 lb 4.9 oz 161 lb 161 lb 14.4 oz  Weight (kg) 70.9 kg 73.029 kg 73.437 kg     Body mass index is 23.77 kg/m.  General:  Elderly male, sitting upright in the bed in no acute distress. Frail HEENT: Bruising present on forehead  Neck: no JVD Vascular: Radial pulses 2+ bilaterally Cardiac:  normal S1, S2; RRR; no murmur.  Lungs:  clear to auscultation bilaterally, no wheezing, rhonchi or rales. Normal work of breathing on [place]  Abd: soft, nontender  Ext: 2+ edema in BLE  Musculoskeletal:  No deformities, BUE and BLE strength normal and equal Skin: warm and dry  Neuro:  No focal abnormalities noted Psych:  Normal affect   EKG:  The EKG was personally reviewed and demonstrates:  No EKG in system  Telemetry:  Telemetry was personally reviewed and demonstrates:  NSR with frequent PVCs   Relevant CV Studies: Cardiac Studies & Procedures       ECHOCARDIOGRAM  ECHOCARDIOGRAM COMPLETE 03/02/2022  Narrative ECHOCARDIOGRAM REPORT    Patient Name:   JUDGE DUQUE Date of Exam: 03/02/2022 Medical Rec #:  [id number]  Height:       68.0 in Accession #:    [id number] Weight:  172.7 lb Date of Birth:  [birth year]  BSA:          1.920 m Patient Age:    26 years   BP:           110/60 mmHg Patient Gender: M          HR:           68 bpm. Exam Location:  Outpatient  Procedure: 2D Echo, 3D Echo, Cardiac Doppler and Color Doppler  Indications:    Jaw Pain  History:        Patient has prior history of Echocardiogram examinations, most recent 09/06/2018. CAD; Risk Factors:Hypertension, Dyslipidemia and Former Smoker. Patient reports  consistent left jaw pain for the last 4 months.  Sonographer:    Jeryl [place] RDCS Referring Phys: [id number] TESSA N CONTE  IMPRESSIONS   1. Challenging windows. Left ventricular ejection fraction, by estimation, is 60 to 65%. The left ventricle has normal function. The left ventricle has no regional wall motion abnormalities. Left ventricular diastolic parameters are consistent with Grade I diastolic dysfunction (impaired relaxation). 2. Right ventricular systolic function is normal. The right ventricular size is normal. Tricuspid regurgitation signal is inadequate for assessing PA pressure. 3. No evidence of mitral valve regurgitation. 4. The aortic valve was not well visualized. Aortic valve regurgitation is not visualized. 5. The inferior vena cava is normal in size with greater than 50% respiratory variability, suggesting right atrial pressure of 3 mmHg.  Comparison(s): No significant change from prior study.  FINDINGS Left Ventricle: Challenging windows. Left ventricular ejection fraction, by estimation, is 60 to 65%. The left ventricle has normal function. The left ventricle has no regional wall motion abnormalities. The left ventricular internal cavity size was normal in size. There is borderline left ventricular hypertrophy. Left ventricular diastolic parameters are consistent with Grade I diastolic dysfunction (impaired relaxation).  Right Ventricle: The right ventricular size is normal. Right ventricular systolic function is normal. Tricuspid regurgitation signal is inadequate for assessing PA pressure.  Left Atrium: Left atrial size was normal in size.  Right Atrium: Right atrial size was normal in size.  Pericardium: There is no evidence of pericardial effusion.  Mitral Valve: No evidence of mitral valve regurgitation.  Tricuspid Valve: Tricuspid valve regurgitation is not demonstrated.  Aortic Valve: The aortic valve was not well visualized. Aortic valve regurgitation is  not visualized.  Pulmonic Valve: The pulmonic valve was not well visualized.  Aorta: The aortic root and ascending aorta are structurally normal, with no evidence of dilitation.  Venous: The inferior vena cava is normal in size with greater than 50% respiratory variability, suggesting right atrial pressure of 3 mmHg.  IAS/Shunts: No atrial level shunt detected by color flow Doppler.   LEFT VENTRICLE PLAX 2D LVIDd:         3.68 cm   Diastology LVIDs:         2.09 cm   LV e' medial:    6.20 cm/s LV PW:         1.09 cm   LV E/e' medial:  10.9 LV IVS:        1.05 cm   LV e' lateral:   11.20 cm/s LVOT diam:     1.80 cm   LV E/e' lateral: 6.0 LV SV:         39 LV SV Index:   20 LVOT Area:     2.54 cm  3D Volume EF: 3D EF:        66 %  LV EDV:       120 ml LV ESV:       40 ml LV SV:        79 ml  RIGHT VENTRICLE RV Basal diam:  3.54 cm RV Mid diam:    2.98 cm RV S prime:     12.10 cm/s TAPSE (M-mode): 1.7 cm  LEFT ATRIUM           Index        RIGHT ATRIUM           Index LA diam:      3.80 cm 1.98 cm/m   RA Area:     10.50 cm LA Vol (A2C): 21.9 ml 11.41 ml/m  RA Volume:   19.50 ml  10.16 ml/m LA Vol (A4C): 40.7 ml 21.20 ml/m AORTIC VALVE LVOT Vmax:   73.90 cm/s LVOT Vmean:  47.200 cm/s LVOT VTI:    0.154 m  AORTA Ao Root diam: 2.80 cm  MITRAL VALVE MV Area (PHT): 3.21 cm    SHUNTS MV Decel Time: 236 msec    Systemic VTI:  0.15 m MV E velocity: 67.60 cm/s  Systemic Diam: 1.80 cm MV A velocity: 71.30 cm/s MV E/A ratio:  0.95  Photographer signed by Carolan Clines Signature Date/Time: 03/02/2022/4:06:23 PM    Final    MONITORS  CARDIAC EVENT MONITOR 06/28/2021            Laboratory Data:  High Sensitivity Troponin:   Recent Labs  Lab 12/20/22 1220 12/20/22 1500  TROPONINIHS 22* 24*     Chemistry Recent Labs  Lab 12/20/22 1020 12/20/22 1220 12/20/22 1949 12/21/22 0444  NA 139  --  135 135  K 2.4*  --  3.4* 3.4*  CL 94*  --   95* 91*  CO2 27  --  31 32  GLUCOSE 204*  --  167* 145*  BUN 9  --  7* 7*  CREATININE 0.77  --  0.74 0.70  CALCIUM 8.0*  --  7.5* 7.6*  MG  --  1.5*  --   --   GFRNONAA >60  --  >60 >60  ANIONGAP 18*  --  9 12    Recent Labs  Lab 12/18/22 0901 12/20/22 1020 12/21/22 0444  PROT 6.2* 6.4* 5.6*  ALBUMIN 2.8* 2.3* 2.0*  AST 8* 13* 10*  ALT 8 12 11   ALKPHOS 140* 123 113  BILITOT 0.5 0.7 0.6   Lipids No results for input(s): "CHOL", "TRIG", "HDL", "LABVLDL", "LDLCALC", "CHOLHDL" in the last 168 hours.  Hematology Recent Labs  Lab 12/18/22 0901 12/20/22 1020 12/21/22 0444  WBC 15.3* 16.3* 11.5*  RBC 3.14* 3.99* 3.67*  HGB 7.5* 9.6* 8.7*  HCT 25.6* 33.0* 31.2*  MCV 81.5 82.7 85.0  MCH 23.9* 24.1* 23.7*  MCHC 29.3* 29.1* 27.9*  RDW 20.0* 19.5* 19.3*  PLT 494* 477* 394   Thyroid No results for input(s): "TSH", "FREET4" in the last 168 hours.  BNP Recent Labs  Lab 12/18/22 0901 12/20/22 1020  BNP 841.8* 569.6*    DDimer No results for input(s): "DDIMER" in the last 168 hours.   Radiology/Studies:  DG Pelvis Portable  Result Date: 12/20/2022 CLINICAL DATA:  Multiple falls EXAM: PORTABLE PELVIS 1-2 VIEWS COMPARISON:  11/27/2022 FINDINGS: There is no evidence of pelvic fracture or diastasis. No fracture or dislocation of the hips. No pelvic bone lesions are seen. IMPRESSION: Negative. Electronically Signed   By: Duanne Guess D.O.   On: 12/20/2022  18:53   CT Head Wo Contrast  Result Date: 12/20/2022 CLINICAL DATA:  Multiple falls including last night. Hit back of head. Posterior bruising and redness. EXAM: CT HEAD WITHOUT CONTRAST CT CERVICAL SPINE WITHOUT CONTRAST TECHNIQUE: Multidetector CT imaging of the head and cervical spine was performed following the standard protocol without intravenous contrast. Multiplanar CT image reconstructions of the cervical spine were also generated. RADIATION DOSE REDUCTION: This exam was performed according to the departmental  dose-optimization program which includes automated exposure control, adjustment of the mA and/or kV according to patient size and/or use of iterative reconstruction technique. COMPARISON:  11/27/2022 FINDINGS: CT HEAD FINDINGS Brain: There is no evidence for acute hemorrhage, hydrocephalus, mass lesion, or abnormal extra-axial fluid collection. No definite CT evidence for acute infarction. Diffuse loss of parenchymal volume is consistent with atrophy. Patchy low attenuation in the deep hemispheric and periventricular white matter is nonspecific, but likely reflects chronic microvascular ischemic demyelination. Vascular: No hyperdense vessel or unexpected calcification. Skull: No evidence for fracture. No worrisome lytic or sclerotic lesion. Sinuses/Orbits: The visualized paranasal sinuses and mastoid air cells are clear. Visualized portions of the globes and intraorbital fat are unremarkable. Other: None. CT CERVICAL SPINE FINDINGS Alignment: No evidence for traumatic subluxation. Skull base and vertebrae: No acute fracture. No primary bone lesion or focal pathologic process. Soft tissues and spinal canal: No prevertebral fluid or swelling. No visible canal hematoma. Disc levels: Mild loss of disc height noted C6-7 with endplate spurring. Facet degeneration noted on the right at C2-3 and C3-4. Upper chest: Unremarkable. Other: None IMPRESSION: 1. No acute intracranial abnormality. 2. Atrophy with chronic small vessel white matter ischemic disease. 3. No cervical spine fracture or traumatic subluxation. Electronically Signed   By: Kennith Center M.D.   On: 12/20/2022 11:37   CT Cervical Spine Wo Contrast  Result Date: 12/20/2022 CLINICAL DATA:  Multiple falls including last night. Hit back of head. Posterior bruising and redness. EXAM: CT HEAD WITHOUT CONTRAST CT CERVICAL SPINE WITHOUT CONTRAST TECHNIQUE: Multidetector CT imaging of the head and cervical spine was performed following the standard protocol without  intravenous contrast. Multiplanar CT image reconstructions of the cervical spine were also generated. RADIATION DOSE REDUCTION: This exam was performed according to the departmental dose-optimization program which includes automated exposure control, adjustment of the mA and/or kV according to patient size and/or use of iterative reconstruction technique. COMPARISON:  11/27/2022 FINDINGS: CT HEAD FINDINGS Brain: There is no evidence for acute hemorrhage, hydrocephalus, mass lesion, or abnormal extra-axial fluid collection. No definite CT evidence for acute infarction. Diffuse loss of parenchymal volume is consistent with atrophy. Patchy low attenuation in the deep hemispheric and periventricular white matter is nonspecific, but likely reflects chronic microvascular ischemic demyelination. Vascular: No hyperdense vessel or unexpected calcification. Skull: No evidence for fracture. No worrisome lytic or sclerotic lesion. Sinuses/Orbits: The visualized paranasal sinuses and mastoid air cells are clear. Visualized portions of the globes and intraorbital fat are unremarkable. Other: None. CT CERVICAL SPINE FINDINGS Alignment: No evidence for traumatic subluxation. Skull base and vertebrae: No acute fracture. No primary bone lesion or focal pathologic process. Soft tissues and spinal canal: No prevertebral fluid or swelling. No visible canal hematoma. Disc levels: Mild loss of disc height noted C6-7 with endplate spurring. Facet degeneration noted on the right at C2-3 and C3-4. Upper chest: Unremarkable. Other: None IMPRESSION: 1. No acute intracranial abnormality. 2. Atrophy with chronic small vessel white matter ischemic disease. 3. No cervical spine fracture or traumatic subluxation. Electronically Signed  By: Kennith Center M.D.   On: 12/20/2022 11:37   DG Chest 2 View  Result Date: 12/20/2022 CLINICAL DATA:  Multiple falls over the past week.  Tachycardia. EXAM: CHEST - 2 VIEW COMPARISON:  Radiographs 12/08/2022  and 11/27/2022.  CT 11/04/2022. FINDINGS: 1049 hours. The heart size and mediastinal contours are stable. There is stable volume loss in the right hemithorax status post previous right middle and lower lobectomy. Stable pleural thickening on the right. The lungs are clear. There is no pleural effusion or pneumothorax. No acute osseous findings are seen. IMPRESSION: Stable chest status post right middle and lower lobectomy. No evidence of acute cardiopulmonary process. Electronically Signed   By: Carey Bullocks M.D.   On: 12/20/2022 11:12     Assessment and Plan:   Possible Syncope  Frequent Falls - Patient has fallen 3-4 times in the past month. Reports that he "just stands up and falls over". On day of presentation, daughter reportedly found him leaning over while sitting on the commode. Patient denies syncope, near syncope, but daughter unsure  - K 2.4 and mag 1.5 on presentation- supplementation ordered per primary  - Hemoglobin low at 8.7, but this is similar to baseline  - Carotid ultrasounds from 07/2021 showed 1-39% stenosis in bilateral ICAs  - Echo pending this admission - Plan for zio patch at DC   Paroxysmal Atrial Fibrillation  - Patient wore a cardiac monitor in 06/2021- scanned into the chart (poor quality). Appears to show <1% burden of afib  - Patient has been on eliquis 5 mg BID, cardizem 240 mg daily  - Telemetry shows no atrial fibrillation. Does show frequent PVCs - Patient has fallen 3-4 times in the past month. Per chart review, daughter told EDP that patient has been having mental decline in the past 4-6 weeks.  - With low Afib burden, recurrent falls, mental decline, I believe that risks of anticoagulation outweigh benefits at this time. Will discuss further with daughter when she is at bedside  - As above, planning outpatient monitor for possible syncope   PVCs  - Patient noted to have frequent PVCs on telemetry  - K 2.4 and mag 1.5 on admission. K improved to 3.4  today  - Maintain K>4, mag >2- supplementation has been ordered per primary  - Echo pending this admission  - Continue diltiazem 240 mg daily  CAD  - Previously underwent cardiac catheterization in 2010 that showed (occlusion RCA with left-to-right collaterals, occluded first diagonal, 80% stenosis ramus intermedius, 50% stenosis proximal RCA, minor irregularities left main and LAD  - Most recent ischemic evaluation was a nuclear stress test in 2019 that showed no evidence of ischemia  - Patient denies chest pain  - Not on ASA as he is on eliquis. Plan to resume ASA if eliquis stopped  - Not on statin therapy due to intolerance   Diastolic Dysfunction on echocardiogram  - Most recent echocardiogram from 02/2022 showed EF 60-65%, no regional wall motion abnormalities, grade I DD, normal RV function. Has not been diagnosed with HFpEF in the past  - Now, BNP elevated to 569. Has lower extremity edema on exam. CXR showed no evidence of acute cardiopulmonary process  - Echocardiogram pending  - patient was given IV lasix 20 mg yesterday- lungs clear on exam today, continues to have lower extremity edema - Ordered additional dose of IV lasix 20 mg today. Also ordered additional 40 mEq K (K 3.4 this AM)   HLD  Aortic Atherosclerosis  -  Lipid panel from 03/2022 showed LDL 86 - Patient not on statin therapy due to intolerances in the past - Consider PCSK9i as an outpatient   Otherwise per primary  - Hypokalemia, hypomagnesemia - Protein-calorie malnutrition  - Adrenal insufficiency  - Invasive fungal sinusitis  - Acquired hypothyroidism  - Anemia of chronic disease  - Renal cell carcinoma    Risk Assessment/Risk Scores:      CHA2DS2-VASc Score = 4   This indicates a 4.8% annual risk of stroke. The patient's score is based upon: CHF History: 0 HTN History: 1 Diabetes History: 0 Stroke History: 0 Vascular Disease History: 1 Age Score: 2 Gender Score: 0       For questions or  updates, please contact [facility] Please consult [email or web address] for contact info under    Signed, Jonita Albee, PA-C  12/21/2022 9:10 AM

## 2022-12-21 NOTE — Evaluation (Signed)
Occupational Therapy Evaluation Patient Details Name: [name] MRN: [id number] DOB: 1945-09-17 Today's Date: 12/21/2022   History of Present Illness Pt is a 77 year old man admitted on 11/3 with frequent falls, syncope/near syncope. Pt +hypokalemia, hypomagnesemia. Daughter reports cognitive decline and decreased PO intake x 4-6 weeks. PMH: OA, CKD III, PAF, R nephrectomy due to cancer, lung ca, PNA, CAD, heart murmur, HLD, HTN, hypothyroidism, incisional hernia, secondary adrenal insufficiency.   Clinical Impression   Pt up in chair upon arrival. He reports he typically ambulates with RW and is assisted with housekeeping and meal prep by his daughter and ADLs as needed. Pt presents with generalized weakness and impaired standing balance with sitting prior to being aligned with surface with poor control of descent. He requires set up to min assist for ADLs. Recommending HHOT upon discharge if family is able to continue providing 24 hour care.       If plan is discharge home, recommend the following: A little help with walking and/or transfers;A little help with bathing/dressing/bathroom;Assistance with cooking/housework;Direct supervision/assist for medications management;Direct supervision/assist for financial management;Assist for transportation;Help with stairs or ramp for entrance    Functional Status Assessment  Patient has had a recent decline in their functional status and demonstrates the ability to make significant improvements in function in a reasonable and predictable amount of time.  Equipment Recommendations  None recommended by OT    Recommendations for Other Services       Precautions / Restrictions Precautions Precautions: Fall Restrictions Weight Bearing Restrictions: No      Mobility Bed Mobility Overal bed mobility: Needs Assistance Bed Mobility: Sit to Supine       Sit to supine: Min assist   General bed mobility comments: assist to reposition once in  supine    Transfers Overall transfer level: Needs assistance Equipment used: Rolling walker (2 wheels) Transfers: Sit to/from Stand, Bed to chair/wheelchair/BSC Sit to Stand: Contact guard assist     Step pivot transfers: Min assist     General transfer comment: pt attempts to sit prior to being aligned with sitting surface, decreased control of descent      Balance Overall balance assessment: Needs assistance   Sitting balance-Leahy Scale: Good Sitting balance - Comments: no LOB managing socks   Standing balance support: Bilateral upper extremity supported Standing balance-Leahy Scale: Poor Standing balance comment: reliant on B UE support and up to min assist                           ADL either performed or assessed with clinical judgement   ADL Overall ADL's : Needs assistance/impaired Eating/Feeding: Set up;Sitting   Grooming: Wash/dry hands;Wash/dry face;Sitting;Set up   Upper Body Bathing: Minimal assistance;Sitting   Lower Body Bathing: Minimal assistance;Sit to/from stand   Upper Body Dressing : Minimal assistance;Sitting   Lower Body Dressing: Minimal assistance;Sit to/from stand Lower Body Dressing Details (indicate cue type and reason): can perform figure 4 Toilet Transfer: Stand-pivot;Rolling walker (2 wheels);Minimal assistance   Toileting- Clothing Manipulation and Hygiene: Minimal assistance;Sit to/from stand       Functional mobility during ADLs: Rolling walker (2 wheels);Minimal assistance       Vision Ability to See in Adequate Light: 0 Adequate Patient Visual Report: No change from baseline       Perception         Praxis         Pertinent Vitals/Pain Pain Assessment Pain Assessment: No/denies pain  Extremity/Trunk Assessment Upper Extremity Assessment Upper Extremity Assessment: Generalized weakness   Lower Extremity Assessment Lower Extremity Assessment: Defer to PT evaluation   Cervical / Trunk  Assessment Cervical / Trunk Assessment: Normal   Communication Communication Communication: Hearing impairment   Cognition Arousal: Alert Behavior During Therapy: Flat affect Overall Cognitive Status: No family/caregiver present to determine baseline cognitive functioning                                 General Comments: pt following commands without difficulty, able to offer home set up, described reason for admission and health decline x 1 year     General Comments       Exercises     Shoulder Instructions      Home Living Family/patient expects to be discharged to:: Private residence Living Arrangements: Alone Available Help at Discharge: Family;Available 24 hours/day (daughter has been staying with pt) Type of Home: House Home Access: Stairs to enter [employer] of Steps: 4 Entrance Stairs-Rails: Right Home Layout: One level     Bathroom Shower/Tub: Walk-in shower;Door   [employer] Toilet: Standard     Home Equipment: Agricultural consultant (2 wheels);Cane - single point;Shower seat          Prior Functioning/Environment Prior Level of Function : Needs assist             Mobility Comments: walking with RW ADLs Comments: assisted for all IADLs and ADLs as needed        OT Problem List: Decreased strength;Decreased activity tolerance;Impaired balance (sitting and/or standing);Decreased cognition;Decreased safety awareness;Decreased knowledge of use of DME or AE      OT Treatment/Interventions: Self-care/ADL training;DME and/or AE instruction;Therapeutic activities;Cognitive remediation/compensation;Patient/family education    OT Goals(Current goals can be found in the care plan section) Acute Rehab OT Goals OT Goal Formulation: With patient Time For Goal Achievement: 01/04/23 Potential to Achieve Goals: Good ADL Goals Pt Will Perform Grooming: with supervision;standing Pt Will Perform Upper Body Bathing: with supervision;sitting Pt  Will Perform Lower Body Bathing: with supervision;sit to/from stand Pt Will Perform Upper Body Dressing: with set-up;sitting Pt Will Perform Lower Body Dressing: with supervision;sit to/from stand Pt Will Transfer to Toilet: with supervision;ambulating Pt Will Perform Toileting - Clothing Manipulation and hygiene: with supervision;sit to/from stand  OT Frequency: Min 1X/week    Co-evaluation              AM-PAC OT "6 Clicks" Daily Activity     Outcome Measure Help from another person eating meals?: None Help from another person taking care of personal grooming?: A Little Help from another person toileting, which includes using toliet, bedpan, or urinal?: A Little Help from another person bathing (including washing, rinsing, drying)?: A Little Help from another person to put on and taking off regular upper body clothing?: A Little Help from another person to put on and taking off regular lower body clothing?: A Little 6 Click Score: 19   End of Session Equipment Utilized During Treatment: Gait belt;Rolling walker (2 wheels)  Activity Tolerance: Patient tolerated treatment well Patient left: in bed;with call bell/phone within reach;with bed alarm set  OT Visit Diagnosis: Unsteadiness on feet (R26.81);Other abnormalities of gait and mobility (R26.89);Muscle weakness (generalized) (M62.81);History of falling (Z91.81);Other symptoms and signs involving cognitive function                Time: 1447-1501 OT Time Calculation (min): 14 min Charges:  OT General Charges $  OT Visit: 1 Visit OT Evaluation $OT Eval Moderate Complexity: 1 Mod  Berna Spare, OTR/L Acute Rehabilitation Services Office: [phone]  Evern Bio 12/21/2022, 3:48 PM

## 2022-12-21 NOTE — Care Management Obs Status (Signed)
MEDICARE OBSERVATION STATUS NOTIFICATION   Patient Details  Name: [name] MRN: [id number] Date of Birth: 1945/03/16   Medicare Observation Status Notification Given:  Yes    Otelia Santee, LCSW 12/21/2022, 1:13 PM

## 2022-12-21 NOTE — Progress Notes (Signed)
Echocardiogram 2D Echocardiogram has been performed.  [name] 12/21/2022, 10:14 AM

## 2022-12-21 NOTE — Progress Notes (Signed)
PROGRESS NOTE  [name]    DOB: [birth year], 77 y.o.  JXB:[id number]    Code Status: Full Code   DOA: 12/20/2022   LOS: 0   Brief hospital course  [name] is a 77 y.o. male with medical history significant of osteoarthritis, stage IIIa chronic kidney disease, right nephrectomy, history of renal cell carcinoma, history of lung cancer, no metastasis, history of multiple episodes of pneumonia, CAD, GERD, heart murmur, hyperlipidemia, hypertension, hypothyroidism, incisional hernia, secondary adrenal insufficiency. They were brought to the emergency department due to having a fall at home with right-sided frontal head injury while sitting on the toilet and bending forward. This is consistent with his other prior syncopal episodes.    Lab work: Urinalysis showed glucose of 50 and protein of 100 mg/dL.  As read bacteria microscopic examination.  CBC is her white count 16.3, hemoglobin 9.6 g/dL platelets 130.  Troponin was 22 then 24 ng/L and BNP 569.6 pg/mL.  Lactic acid was normal.  Magnesium was 1.5 mg/dL.  CMP showed potassium of 24 and chloride 94 mmol/L, the rest of the electrolytes were normal after calcium correction.  Normal renal function.  AST 13 units/L, total protein 6.4 and albumin 2.3 g/dL.  The rest of the LFTs were unremarkable.   Imaging: 2 view chest radiograph showing stable chest with status post right middle and lower lobectomy.  No evidence of acute cardiopulmonary process.   CT head without contrast with no acute intracranial normality.  There was atrophy with chronic small vessel white matter ischemic disease.   CT cervical spine with no fracture or traumatic subluxation.   ED course: Initial vital signs were temperature 98.6 F, pulse 110, respiration 18, BP 144/81 mmHg O2 sat 96% on room air.  The patient received LR 1000 bolus, magnesium sulfate 2 g IVPB and KCl 40 mEq p.o. x 1.  Patient was admitted to medicine service for further workup and management of syncope as  outlined in detail below.  12/21/22 -thus far, workup overall unremarkable. 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  - continue telemetry - echo pending - cardiology following-   - increased metoprolol  - stopped eliquis for frequent falls and inconsistent use  - increase protein intake  - replace electrolytes PRN   Coronary artery disease   Grade I diastolic dysfunction  HTN- BNP mildly elevated - now off apixaban.  Can consider starting asa - diuretic PRN - Continue Cardizem 240 mg p.o. daily.     Paroxysmal atrial fibrillation CHA?DS?-VASc Score of at least 5. Continue diltiazem 240 mg p.o. daily. stop apixaban 2.5 mg p.o. twice daily.      Hypokalemia- improving.  2.4>3.4 Secondary to furosemide use. - monitor and replete PRN     Hypomagnesemia  Secondary to diuretic. Discussed with  the daughter and the need for regular supplement.     Protein-calorie malnutrition, severe In the setting of anemia and malignancy. - dietitian consult   Adrenal insufficiency (HCC) Continue hydrocortisone 20 mg in AM. Continue hydrocortisone 5 mg in p.m. Per last endocrinology endocrinology AM dose to be tapered to 15 mg. -However, it seems like he has  been taking 20 mg. -Will defer taper after discharge when he is in less physical stress.     Invasive fungal sinusitis Continue posaconazole 300 mg p.o. daily. Follow-up with infectious diseases as an outpatient.    Acquired hypothyroidism Continue levothyroxine 150 mcg p.o. daily.     Anemia of chronic disease  thrombocytosis- hgb improved to 9.6 s/p blood transfusion and decreased to 8.7 today. No sign of active bleeding - CBC am     GERD (gastroesophageal reflux disease) Antiacid, H2 blocker or PPI as needed.  Body mass index is 23.77 kg/m.  VTE ppx: apixaban (ELIQUIS) tablet 2.5 mg Start: 12/20/22 2200 apixaban (ELIQUIS) tablet 2.5 mg   Diet:     Diet   Diet Heart Fluid consistency: Thin; Fluid restriction: 1200 mL Fluid   Consultants: Cardiology   Subjective 12/21/22    Pt reports no complaints. Feels well. Daughter at bedside   Objective   Vitals:   12/20/22 2245 12/21/22 0224 12/21/22 0500 12/21/22 0609  BP: (!) 144/76 (!) 150/80  (!) 142/85  Pulse: 95 93  97  Resp:      Temp: 97.8 F (36.6 C) 97.9 F (36.6 C)  97.8 F (36.6 C)  TempSrc: Oral   Oral  SpO2: 100% 100%  100%  Weight:   70.9 kg     Intake/Output Summary (Last 24 hours) at 12/21/2022 0746 Last data filed at 12/20/2022 2255 Gross per 24 hour  Intake 240 ml  Output 1200 ml  Net -960 ml   Filed Weights   12/21/22 0500  Weight: 70.9 kg     Physical Exam:  General: awake, alert, NAD HEENT: atraumatic, clear conjunctiva, anicteric sclera, MMM, hard of hearing Respiratory: normal respiratory effort. Cardiovascular: quick capillary refill, normal S1/S2, RRR, no JVD, murmurs Nervous: A&O x3. no gross focal neurologic deficits, normal speech Extremities: mild edema, normal tone Skin: dry, intact, normal temperature, normal color. No rashes, lesions or ulcers on exposed skin Psychiatry: normal mood, congruent affect  Labs   I have personally reviewed the following labs and imaging  studies CBC    Component Value Date/Time   WBC 11.5 (H) 12/21/2022 0444   RBC 3.67 (L) 12/21/2022 0444   HGB 8.7 (L) 12/21/2022 0444   HGB 7.5 (L) 12/18/2022 0901   HGB 13.3 03/11/2018 0913   HCT 31.2 (L) 12/21/2022 0444   HCT 40.1 03/11/2018 0913   PLT 394 12/21/2022 0444   PLT 494 (H) 12/18/2022 0901   PLT 248 03/11/2018 0913   MCV 85.0 12/21/2022 0444   MCV 86 03/11/2018 0913   MCH 23.7 (L) 12/21/2022 0444   MCHC 27.9 (L) 12/21/2022 0444   RDW 19.3 (H) 12/21/2022 0444   RDW 15.6 (H) 03/11/2018 0913   LYMPHSABS 1.4 12/20/2022 1020   LYMPHSABS 3.0 03/11/2018 0913   MONOABS 1.3 (H) 12/20/2022 1020   EOSABS 0.1 12/20/2022 1020   EOSABS 0.7 (H) 03/11/2018 0913   BASOSABS 0.0 12/20/2022 1020   BASOSABS 0.1 03/11/2018 0913      Latest Ref Rng & Units 12/21/2022    4:44 AM 12/20/2022    7:49 PM 12/20/2022   10:20  AM  BMP  Glucose 70 - 99 mg/dL 409  811  914   BUN 8 - 23 mg/dL 7  7  9    Creatinine 0.61 - 1.24 mg/dL 7.82  9.56  2.13   Sodium 135 - 145 mmol/L 135  135  139   Potassium 3.5 - 5.1 mmol/L 3.4  3.4  2.4   Chloride 98 - 111 mmol/L 91  95  94   CO2 22 - 32 mmol/L 32  31  27   Calcium 8.9 - 10.3 mg/dL 7.6  7.5  8.0     DG Pelvis Portable  Result Date: 12/20/2022 CLINICAL DATA:  Multiple falls EXAM: PORTABLE PELVIS 1-2 VIEWS COMPARISON:  11/27/2022 FINDINGS: There is no evidence of pelvic fracture or diastasis. No fracture or dislocation of the hips. No pelvic bone lesions are seen. IMPRESSION: Negative. Electronically Signed   By: Duanne Guess D.O.   On: 12/20/2022 18:53   CT Head Wo Contrast  Result Date: 12/20/2022 CLINICAL DATA:  Multiple falls including last night. Hit back of head. Posterior bruising and redness. EXAM: CT HEAD WITHOUT CONTRAST CT CERVICAL SPINE WITHOUT CONTRAST TECHNIQUE: Multidetector CT imaging of the head and cervical spine was performed following the standard protocol without intravenous contrast. Multiplanar CT image reconstructions of  the cervical spine were also generated. RADIATION DOSE REDUCTION: This exam was performed according to the departmental dose-optimization program which includes automated exposure control, adjustment of the mA and/or kV according to patient size and/or use of iterative reconstruction technique. COMPARISON:  11/27/2022 FINDINGS: CT HEAD FINDINGS Brain: There is no evidence for acute hemorrhage, hydrocephalus, mass lesion, or abnormal extra-axial fluid collection. No definite CT evidence for acute infarction. Diffuse loss of parenchymal volume is consistent with atrophy. Patchy low attenuation in the deep hemispheric and periventricular white matter is nonspecific, but likely reflects chronic microvascular ischemic demyelination. Vascular: No hyperdense vessel or unexpected calcification. Skull: No evidence for fracture. No worrisome lytic or sclerotic lesion. Sinuses/Orbits: The visualized paranasal sinuses and mastoid air cells are clear. Visualized portions of the globes and intraorbital fat are unremarkable. Other: None. CT CERVICAL SPINE FINDINGS Alignment: No evidence for traumatic subluxation. Skull base and vertebrae: No acute fracture. No primary bone lesion or focal pathologic process. Soft tissues and spinal canal: No prevertebral fluid or swelling. No visible canal hematoma. Disc levels: Mild loss of disc height noted C6-7 with endplate spurring. Facet degeneration noted on the right at C2-3 and C3-4. Upper chest: Unremarkable. Other: None IMPRESSION: 1. No acute intracranial abnormality. 2. Atrophy with chronic small vessel white matter ischemic disease. 3. No cervical spine fracture or traumatic subluxation. Electronically Signed   By: Kennith Center M.D.   On: 12/20/2022 11:37   CT Cervical Spine Wo Contrast  Result Date: 12/20/2022 CLINICAL DATA:  Multiple falls including last night. Hit back of head. Posterior bruising and redness. EXAM: CT HEAD WITHOUT CONTRAST CT CERVICAL SPINE WITHOUT CONTRAST  TECHNIQUE: Multidetector CT imaging of the head and cervical spine was performed following the standard protocol without intravenous contrast. Multiplanar CT image reconstructions of the cervical spine were also generated. RADIATION DOSE REDUCTION: This exam was performed according to the departmental dose-optimization program which includes automated exposure control, adjustment of the mA and/or kV according to patient size and/or use of iterative reconstruction technique. COMPARISON:  11/27/2022 FINDINGS: CT HEAD FINDINGS Brain: There is no evidence for acute hemorrhage, hydrocephalus, mass lesion, or abnormal extra-axial fluid collection. No definite CT evidence for acute infarction. Diffuse loss  of parenchymal volume is consistent with atrophy. Patchy low attenuation in the deep hemispheric and periventricular white matter is nonspecific, but likely reflects chronic microvascular ischemic demyelination. Vascular: No hyperdense vessel or unexpected calcification. Skull: No evidence for fracture. No worrisome lytic or sclerotic lesion. Sinuses/Orbits: The visualized paranasal sinuses and mastoid air cells are clear. Visualized portions of the globes and intraorbital fat are unremarkable. Other: None. CT CERVICAL SPINE FINDINGS Alignment: No evidence for traumatic subluxation. Skull base and vertebrae: No acute fracture. No primary bone lesion or focal pathologic process. Soft tissues and spinal canal: No prevertebral fluid or swelling. No visible canal hematoma. Disc levels: Mild loss of disc height noted C6-7 with endplate spurring. Facet degeneration noted on the right at C2-3 and C3-4. Upper chest: Unremarkable. Other: None IMPRESSION: 1. No acute intracranial abnormality. 2. Atrophy with chronic small vessel white matter ischemic disease. 3. No cervical spine fracture or traumatic subluxation. Electronically Signed   By: Kennith Center M.D.   On: 12/20/2022 11:37   DG Chest 2 View  Result Date:  12/20/2022 CLINICAL DATA:  Multiple falls over the past week.  Tachycardia. EXAM: CHEST - 2 VIEW COMPARISON:  Radiographs 12/08/2022 and 11/27/2022.  CT 11/04/2022. FINDINGS: 1049 hours. The heart size and mediastinal contours are stable. There is stable volume loss in the right hemithorax status post previous right middle and lower lobectomy. Stable pleural thickening on the right. The lungs are clear. There is no pleural effusion or pneumothorax. No acute osseous findings are seen. IMPRESSION: Stable chest status post right middle and lower lobectomy. No evidence of acute cardiopulmonary process. Electronically Signed   By: Carey Bullocks M.D.   On: 12/20/2022 11:12    Disposition Plan & Communication  Patient status: Observation  Admitted From: Home Planned disposition location: Home Anticipated discharge date: 11/5 pending cardiology clearance  Family Communication: daughter at bedside    Author: Leeroy Bock, DO [facility] 12/21/2022, 7:46 AM   Available by Epic secure chat 7AM-7PM. If 7PM-7AM, please contact night-coverage.  [facility] contact information found on [email or web address].

## 2022-12-22 DIAGNOSIS — R296 Repeated falls: Secondary | ICD-10-CM | POA: Diagnosis not present

## 2022-12-22 DIAGNOSIS — I480 Paroxysmal atrial fibrillation: Secondary | ICD-10-CM | POA: Diagnosis not present

## 2022-12-22 DIAGNOSIS — E440 Moderate protein-calorie malnutrition: Secondary | ICD-10-CM | POA: Insufficient documentation

## 2022-12-22 DIAGNOSIS — S0990XA Unspecified injury of head, initial encounter: Secondary | ICD-10-CM | POA: Diagnosis not present

## 2022-12-22 DIAGNOSIS — R55 Syncope and collapse: Secondary | ICD-10-CM | POA: Diagnosis not present

## 2022-12-22 DIAGNOSIS — R630 Anorexia: Secondary | ICD-10-CM

## 2022-12-22 DIAGNOSIS — E876 Hypokalemia: Secondary | ICD-10-CM | POA: Diagnosis not present

## 2022-12-22 LAB — BASIC METABOLIC PANEL
Anion gap: 13 (ref 5–15)
BUN: 14 mg/dL (ref 8–23)
CO2: 34 mmol/L — ABNORMAL HIGH (ref 22–32)
Calcium: 8.1 mg/dL — ABNORMAL LOW (ref 8.9–10.3)
Chloride: 93 mmol/L — ABNORMAL LOW (ref 98–111)
Creatinine, Ser: 0.84 mg/dL (ref 0.61–1.24)
GFR, Estimated: >60 mL/min (ref >60)
Glucose, Bld: 185 mg/dL — ABNORMAL HIGH (ref 70–99)
Potassium: 3.4 mmol/L — ABNORMAL LOW (ref 3.5–5.1)
Sodium: 140 mmol/L (ref 135–145)

## 2022-12-22 LAB — CBC
HCT: 28.7 % — ABNORMAL LOW (ref 39.0–52.0)
Hemoglobin: 8.1 g/dL — ABNORMAL LOW (ref 13.0–17.0)
MCH: 24 pg — ABNORMAL LOW (ref 26.0–34.0)
MCHC: 28.2 g/dL — ABNORMAL LOW (ref 30.0–36.0)
MCV: 85.2 fL (ref 80.0–100.0)
Platelets: 387 10*3/uL (ref 150–400)
RBC: 3.37 MIL/uL — ABNORMAL LOW (ref 4.22–5.81)
RDW: 19.4 % — ABNORMAL HIGH (ref 11.5–15.5)
WBC: 13.6 10*3/uL — ABNORMAL HIGH (ref 4.0–10.5)
nRBC: 0 % (ref 0.0–0.2)

## 2022-12-22 MED ORDER — HYDROCORTISONE 5 MG PO TABS: 5.0000 mg | ORAL_TABLET | ORAL | Status: DC

## 2022-12-22 MED ORDER — ADULT MULTIVITAMIN W/MINERALS CH
1.0000 | ORAL_TABLET | Freq: Every day | ORAL | Status: DC
  Administered 2022-12-22: 1 via ORAL
  Filled 2022-12-22: qty 1

## 2022-12-22 MED ORDER — METOPROLOL TARTRATE 50 MG PO TABS: 50.0000 mg | ORAL_TABLET | Freq: Two times a day (BID) | ORAL | Status: DC

## 2022-12-22 MED ORDER — METOPROLOL SUCCINATE ER 100 MG PO TB24: 100.0000 mg | ORAL_TABLET | Freq: Every day | ORAL | 0 refills | Status: DC

## 2022-12-22 MED ORDER — MEGESTROL ACETATE 20 MG PO TABS: 20.0000 mg | ORAL_TABLET | Freq: Every morning | ORAL | Status: DC

## 2022-12-22 MED ORDER — ENSURE ENLIVE PO LIQD
237.0000 mL | Freq: Three times a day (TID) | ORAL | Status: DC
  Administered 2022-12-22: 237 mL via ORAL

## 2022-12-22 MED ORDER — METOPROLOL SUCCINATE ER 50 MG PO TB24
100.0000 mg | ORAL_TABLET | Freq: Every day | ORAL | Status: DC
  Administered 2022-12-22: 100 mg via ORAL
  Filled 2022-12-22: qty 2

## 2022-12-22 NOTE — Discharge Instructions (Signed)
Please review your medication list carefully for any changes including that your eliquis was discontinued and you were started on metoprolol to help control your heart rate.   Follow up with cardiology within 2-3 weeks

## 2022-12-22 NOTE — Progress Notes (Signed)
Heart Failure Navigator Progress Note  Assessed for Heart & Vascular TOC clinic readiness.  Patient does not meet criteria due to per MD note patient with history of Metastatic cancer and cognitive decline. .   Navigator will sign off at this time.   Rhae Hammock, BSN, Scientist, clinical (histocompatibility and immunogenetics) Only

## 2022-12-22 NOTE — Discharge Summary (Incomplete)
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 male with medical history significant of osteoarthritis, stage IIIa chronic kidney disease, right nephrectomy, history of renal cell carcinoma, history of lung cancer, no metastasis, history of multiple episodes of pneumonia, CAD, GERD, heart murmur, hyperlipidemia, hypertension, hypothyroidism, incisional hernia, secondary adrenal insufficiency. They were brought to the emergency department due to having a fall at home with right-sided frontal head injury while sitting on the toilet and bending forward. This is consistent with his other prior syncopal episodes.    ED course: Initial vital signs were temperature 98.6 F, pulse 110, respiration 18, BP 144/81 mmHg O2 sat 96% on room air.  Urinalysis showed glucose of 50 and protein of 100 mg/dL. WBC 16.3, hemoglobin 9.6 g/dL platelets 811.  Troponin was 22 then 24 ng/L and BNP 569.6 pg/mL.   Lactic acid was normal.  Magnesium was 1.5 mg/dL.  CMP showed potassium of 24 and chloride 94 mmol/L, the rest of the electrolytes were normal after calcium correction.  Normal renal function.  AST 13 units/L, total protein 6.4 and albumin 2.3 g/dL.  The rest of the LFTs were unremarkable.   Imaging: 2 view chest radiograph showing stable chest with status post right middle and lower lobectomy.  No evidence of acute cardiopulmonary process.   CT head without contrast with no acute intracranial normality.  There was atrophy with chronic small vessel white matter ischemic disease.   CT cervical spine with no fracture or traumatic subluxation.   The patient received LR 1000 bolus, magnesium sulfate 2 g IVPB and KCl 40 mEq p.o. x 1.   workup was mostly unremarkable. Episodes of falling in the past few weeks were all associated with bending over and/or being on the toilet and he remembers all of the events. No more episodes while inpatient. No further workup needed at this time. Cardiology consulted and started metoprolol to help treat frequent PVCs.In the context of his frequent falls and unreliable use of eliquis, this medication was discontinued by cardiology.  Unremarkable echo.   He was recommended to receive HH PT and OT.   All other chronic conditions were treated with home medications.   Discharge Condition: Good, improved Recommended discharge diet: Cardiac diet  Consultations: Cardiology   Procedures/Studies: Echo   Discharge Instructions     Discharge patient   Complete by: As directed    Discharge disposition: 01-Home or Self Care   Discharge patient date: 12/22/2022      Allergies as of 12/22/2022  Reactions   Diphenhydramine Other (See Comments)   Severe restless legs        Medication List     STOP taking these medications    apixaban 2.5 MG Tabs tablet Commonly known as: ELIQUIS   ezetimibe 10 MG tablet Commonly known as: ZETIA   furosemide 20 MG  tablet Commonly known as: LASIX   Inlyta 1 MG tablet Generic drug: axitinib   potassium chloride 20 MEQ/15ML (10%) Soln   traZODone 150 MG tablet Commonly known as: DESYREL   Welireg 40 MG tablet Generic drug: belzutifan       TAKE these medications    acetaminophen 500 MG tablet Commonly known as: TYLENOL Take 1,000 mg by mouth as needed for mild pain (pain score 1-3) or moderate pain (pain score 4-6).   Centrum Silver 50+Men Tabs Take 1 tablet by mouth at bedtime.   diazepam 2 MG tablet Commonly known as: VALIUM TAKE 1-2 TABLETS BY MOUTH EVERY 8 HOURS AS NEEDED FOR ANXIETY What changed: See the new instructions.   diltiazem 240 MG 24 hr capsule Commonly known as: CARDIZEM CD Take 1 capsule (240 mg total) by mouth in the morning.   famotidine 20 MG tablet Commonly known as: PEPCID Take 20 mg by mouth daily.   fluticasone 50 MCG/ACT nasal spray Commonly known as: FLONASE Place 1 spray into both nostrils as needed for allergies.   hydrocortisone 20 MG tablet Commonly known as: CORTEF Take 20 mg by mouth in the morning.   hydrocortisone 5 MG tablet Commonly known as: CORTEF Take 1 tablet (5 mg total) by mouth See admin instructions. Daily at 3pm.   levothyroxine 150 MCG tablet Commonly known as: SYNTHROID Take 1 tablet (150 mcg total) by mouth daily before breakfast.   loratadine 10 MG tablet Commonly known as: CLARITIN Take 10 mg by mouth daily.   megestrol 20 MG tablet Commonly known as: MEGACE Take 1 tablet (20 mg total) by mouth in the morning.   metoprolol succinate 100 MG 24 hr tablet Commonly known as: TOPROL-XL Take 1 tablet (100 mg total) by mouth daily. Take with or immediately following a meal. Start taking on: December 23, 2022   mupirocin ointment 2 % Commonly known as: BACTROBAN Place 1 Application into the nose daily as needed (sores).   posaconazole 100 MG Tbec delayed-release tablet Commonly known as: NOXAFIL Take 3 tablets (300  mg total) by mouth daily.   VITAMIN D PO Take 1 capsule by mouth at bedtime.        Follow-up Information     [facility] Follow up.   Specialty: Home Health Services Why: Frances Furbish will follow up with you at discharge to provide home health services Contact information: [address] [place] [address] [phone]                Subjective   Pt reports feeling well. He denies headache. Has not had any episodes of syncope. Feels like going home today.   All questions and concerns were addressed at time of discharge.  Objective  Blood pressure 131/71, pulse 86, temperature 98.4 F (36.9 C), temperature source Oral, resp. rate 16, weight 68.9 kg, SpO2 94%.   General: Pt is alert, awake, not in acute distress. Ecchymosis on R forehead Cardiovascular: RRR, S1/S2 +, no rubs, no gallops Respiratory: CTA bilaterally, no wheezing, no rhonchi Abdominal: Soft, NT, ND, bowel sounds + Extremities: no edema, no cyanosis  The results of significant diagnostics from  this hospitalization (including imaging, microbiology, ancillary and laboratory) are listed below for reference.   Imaging studies: ECHOCARDIOGRAM COMPLETE  Result Date: 12/21/2022    ECHOCARDIOGRAM REPORT   Patient Name:   CHADWIN FURY Date of Exam: 12/21/2022 Medical Rec #:  [id number]  Height:       68.0 in Accession #:    [id number] Weight:       156.3 lb Date of Birth:  Sep 21, 1945  BSA:          1.840 m Patient Age:    77 years   BP:           142/85 mmHg Patient Gender: M          HR:           106 bpm. Exam Location:  Inpatient Procedure: 2D Echo, Cardiac Doppler, Color Doppler and Intracardiac            Opacification Agent Indications:    Syncope R55  History:        Patient has prior history of Echocardiogram examinations, most                 recent 03/02/2022. CHF, CAD, Arrythmias:Atrial Fibrillation,                 Signs/Symptoms:Syncope; Risk Factors:Hypertension and                  Dyslipidemia. CKD.  Sonographer:    Lucendia Herrlich RCS Referring Phys: [phone] DAVID MANUEL ORTIZ  Sonographer Comments: Image acquisition challenging due to patient body habitus. IMPRESSIONS  1. Left ventricular ejection fraction, by estimation, is 55 to 60%. The left ventricle has normal function. The left ventricle has no regional wall motion abnormalities. Left ventricular diastolic parameters are consistent with Grade I diastolic dysfunction (impaired relaxation).  2. Right ventricular systolic function is normal. The right ventricular size is normal.  3. The mitral valve is normal in structure. Trivial mitral valve regurgitation. No evidence of mitral stenosis.  4. The aortic valve is tricuspid. There is mild calcification of the aortic valve. Aortic valve regurgitation is not visualized. Aortic valve sclerosis/calcification is present, without any evidence of aortic stenosis.  5. The inferior vena cava is normal in size with greater than 50% respiratory variability, suggesting right atrial pressure of 3 mmHg. FINDINGS  Left Ventricle: Left ventricular ejection fraction, by estimation, is 55 to 60%. The left ventricle has normal function. The left ventricle has no regional wall motion abnormalities. Definity contrast agent was given IV to delineate the left ventricular  endocardial borders. The left ventricular internal cavity size was normal in size. There is no left ventricular hypertrophy. Left ventricular diastolic parameters are consistent with Grade I diastolic dysfunction (impaired relaxation). Right Ventricle: The right ventricular size is normal. No increase in right ventricular wall thickness. Right ventricular systolic function is normal. Left Atrium: Left atrial size was normal in size. Right Atrium: Right atrial size was normal in size. Pericardium: There is no evidence of pericardial effusion. Mitral Valve: The mitral valve is normal in structure. Mild mitral annular calcification. Trivial  mitral valve regurgitation. No evidence of mitral valve stenosis. Tricuspid Valve: The tricuspid valve is normal in structure. Tricuspid valve regurgitation is not demonstrated. No evidence of tricuspid stenosis. Aortic Valve: The aortic valve is tricuspid. There is mild calcification of the aortic valve. Aortic valve regurgitation is not visualized. Aortic valve sclerosis/calcification is present, without any evidence of aortic stenosis. Aortic valve peak gradient measures 5.0 mmHg.  Pulmonic Valve: The pulmonic valve was not well visualized. Pulmonic valve regurgitation is not visualized. No evidence of pulmonic stenosis. Aorta: The ascending aorta was not well visualized and the aortic root was not well visualized. Venous: The inferior vena cava is normal in size with greater than 50% respiratory variability, suggesting right atrial pressure of 3 mmHg. IAS/Shunts: No atrial level shunt detected by color flow Doppler.  LEFT VENTRICLE PLAX 2D LVIDd:         4.30 cm   Diastology LVIDs:         3.70 cm   LV e' medial:    6.20 cm/s LV PW:         0.70 cm   LV E/e' medial:  14.6 LV IVS:        0.90 cm   LV e' lateral:   8.27 cm/s LVOT diam:     2.10 cm   LV E/e' lateral: 11.0 LV SV:         41 LV SV Index:   22 LVOT Area:     3.46 cm  RIGHT VENTRICLE             IVC RV S prime:     12.10 cm/s  IVC diam: 1.90 cm TAPSE (M-mode): 1.3 cm LEFT ATRIUM             Index        RIGHT ATRIUM          Index LA diam:        2.60 cm 1.41 cm/m   RA Area:     7.10 cm LA Vol (A2C):   37.8 ml 20.54 ml/m  RA Volume:   8.04 ml  4.37 ml/m LA Vol (A4C):   25.8 ml 14.02 ml/m LA Biplane Vol: 34.0 ml 18.47 ml/m  AORTIC VALVE AV Area (Vmax): 2.20 cm AV Vmax:        112.00 cm/s AV Peak Grad:   5.0 mmHg LVOT Vmax:      71.10 cm/s LVOT Vmean:     46.000 cm/s LVOT VTI:       0.119 m MITRAL VALVE                TRICUSPID VALVE MV Area (PHT): 6.27 cm     TR Peak grad:   10.9 mmHg MV Decel Time: 121 msec     TR Vmax:        165.00 cm/s MR  Peak grad: 19.0 mmHg MR Vmax:      218.00 cm/s   SHUNTS MV E velocity: 90.60 cm/s   Systemic VTI:  0.12 m MV A velocity: 113.00 cm/s  Systemic Diam: 2.10 cm MV E/A ratio:  0.80 Arvilla Meres MD Electronically signed by Arvilla Meres MD Signature Date/Time: 12/21/2022/10:31:26 AM    Final    DG Pelvis Portable  Result Date: 12/20/2022 CLINICAL DATA:  Multiple falls EXAM: PORTABLE PELVIS 1-2 VIEWS COMPARISON:  11/27/2022 FINDINGS: There is no evidence of pelvic fracture or diastasis. No fracture or dislocation of the hips. No pelvic bone lesions are seen. IMPRESSION: Negative. Electronically Signed   By: Duanne Guess D.O.   On: 12/20/2022 18:53   CT Head Wo Contrast  Result Date: 12/20/2022 CLINICAL DATA:  Multiple falls including last night. Hit back of head. Posterior bruising and redness. EXAM: CT HEAD WITHOUT CONTRAST CT CERVICAL SPINE WITHOUT CONTRAST TECHNIQUE: Multidetector CT imaging of the head and cervical spine was performed following the standard protocol without intravenous contrast. Multiplanar  CT image reconstructions of the cervical spine were also generated. RADIATION DOSE REDUCTION: This exam was performed according to the departmental dose-optimization program which includes automated exposure control, adjustment of the mA and/or kV according to patient size and/or use of iterative reconstruction technique. COMPARISON:  11/27/2022 FINDINGS: CT HEAD FINDINGS Brain: There is no evidence for acute hemorrhage, hydrocephalus, mass lesion, or abnormal extra-axial fluid collection. No definite CT evidence for acute infarction. Diffuse loss of parenchymal volume is consistent with atrophy. Patchy low attenuation in the deep hemispheric and periventricular white matter is nonspecific, but likely reflects chronic microvascular ischemic demyelination. Vascular: No hyperdense vessel or unexpected calcification. Skull: No evidence for fracture. No worrisome lytic or sclerotic lesion.  Sinuses/Orbits: The visualized paranasal sinuses and mastoid air cells are clear. Visualized portions of the globes and intraorbital fat are unremarkable. Other: None. CT CERVICAL SPINE FINDINGS Alignment: No evidence for traumatic subluxation. Skull base and vertebrae: No acute fracture. No primary bone lesion or focal pathologic process. Soft tissues and spinal canal: No prevertebral fluid or swelling. No visible canal hematoma. Disc levels: Mild loss of disc height noted C6-7 with endplate spurring. Facet degeneration noted on the right at C2-3 and C3-4. Upper chest: Unremarkable. Other: None IMPRESSION: 1. No acute intracranial abnormality. 2. Atrophy with chronic small vessel white matter ischemic disease. 3. No cervical spine fracture or traumatic subluxation. Electronically Signed   By: Kennith Center M.D.   On: 12/20/2022 11:37   CT Cervical Spine Wo Contrast  Result Date: 12/20/2022 CLINICAL DATA:  Multiple falls including last night. Hit back of head. Posterior bruising and redness. EXAM: CT HEAD WITHOUT CONTRAST CT CERVICAL SPINE WITHOUT CONTRAST TECHNIQUE: Multidetector CT imaging of the head and cervical spine was performed following the standard protocol without intravenous contrast. Multiplanar CT image reconstructions of the cervical spine were also generated. RADIATION DOSE REDUCTION: This exam was performed according to the departmental dose-optimization program which includes automated exposure control, adjustment of the mA and/or kV according to patient size and/or use of iterative reconstruction technique. COMPARISON:  11/27/2022 FINDINGS: CT HEAD FINDINGS Brain: There is no evidence for acute hemorrhage, hydrocephalus, mass lesion, or abnormal extra-axial fluid collection. No definite CT evidence for acute infarction. Diffuse loss of parenchymal volume is consistent with atrophy. Patchy low attenuation in the deep hemispheric and periventricular white matter is nonspecific, but likely  reflects chronic microvascular ischemic demyelination. Vascular: No hyperdense vessel or unexpected calcification. Skull: No evidence for fracture. No worrisome lytic or sclerotic lesion. Sinuses/Orbits: The visualized paranasal sinuses and mastoid air cells are clear. Visualized portions of the globes and intraorbital fat are unremarkable. Other: None. CT CERVICAL SPINE FINDINGS Alignment: No evidence for traumatic subluxation. Skull base and vertebrae: No acute fracture. No primary bone lesion or focal pathologic process. Soft tissues and spinal canal: No prevertebral fluid or swelling. No visible canal hematoma. Disc levels: Mild loss of disc height noted C6-7 with endplate spurring. Facet degeneration noted on the right at C2-3 and C3-4. Upper chest: Unremarkable. Other: None IMPRESSION: 1. No acute intracranial abnormality. 2. Atrophy with chronic small vessel white matter ischemic disease. 3. No cervical spine fracture or traumatic subluxation. Electronically Signed   By: Kennith Center M.D.   On: 12/20/2022 11:37   DG Chest 2 View  Result Date: 12/20/2022 CLINICAL DATA:  Multiple falls over the past week.  Tachycardia. EXAM: CHEST - 2 VIEW COMPARISON:  Radiographs 12/08/2022 and 11/27/2022.  CT 11/04/2022. FINDINGS: 1049 hours. The heart size and mediastinal contours are  stable. There is stable volume loss in the right hemithorax status post previous right middle and lower lobectomy. Stable pleural thickening on the right. The lungs are clear. There is no pleural effusion or pneumothorax. No acute osseous findings are seen. IMPRESSION: Stable chest status post right middle and lower lobectomy. No evidence of acute cardiopulmonary process. Electronically Signed   By: Carey Bullocks M.D.   On: 12/20/2022 11:12   CT MAXILLOFACIAL W CONTRAST  Result Date: 12/09/2022 CLINICAL DATA:  Follow-up fungal sinusitis. EXAM: CT MAXILLOFACIAL WITH CONTRAST TECHNIQUE: Multidetector CT imaging of the maxillofacial  structures was performed with intravenous contrast. Multiplanar CT image reconstructions were also generated. RADIATION DOSE REDUCTION: This exam was performed according to the departmental dose-optimization program which includes automated exposure control, adjustment of the mA and/or kV according to patient size and/or use of iterative reconstruction technique. CONTRAST:  75mL OMNIPAQUE IOHEXOL 300 MG/ML  SOLN COMPARISON:  Maxillofacial CT 09/03/2022 FINDINGS: Osseous: No acute fracture or new bone lesion. Orbits: Bilateral cataract extraction. Mild extraconal soft tissue thickening inferolaterally in the left orbit along the area bone destruction, decreased from prior. Sinuses: Unchanged bone destruction involving the posterior and inferior walls of the left maxillary sinus as well as mild involvement of the left pterygoid process. Circumferential soft tissue in the left maxillary sinus on the prior CT has greatly improved, with residual nodular soft tissue present in the inferior and posterior aspects of the sinus currently. Abnormal soft tissue extending from the sinus into the masticator space, infratemporal fossa, and pterygoid palatine fossa has overall mildly improved. A left maxillary antrostomy is widely patent. There is minimal scattered mucosal thickening in the other paranasal sinuses, and there is rightward nasal septal deviation. Soft tissues: As above. Limited intracranial: No acute finding. IMPRESSION: Greatly decreased soft tissue in the left maxillary sinus. Unchanged associated bone destruction and mildly improved soft tissue infiltrating the regional deep spaces as above. No new or progressive finding. Electronically Signed   By: Sebastian Ache M.D.   On: 12/09/2022 17:05   DG Chest Port 1 View  Result Date: 12/08/2022 CLINICAL DATA:  Shortness of breath EXAM: PORTABLE CHEST 1 VIEW COMPARISON:  Chest x-ray 11/27/2022 FINDINGS: There is stable elevation of the right hemidiaphragm with  blunting of the right costophrenic angle, likely scarring. Cardiomediastinal silhouette is stable. Surgical clips overlie the right hilum. Left lung is clear. No pneumothorax. No acute fractures are seen. IMPRESSION: No acute findings. Electronically Signed   By: Darliss Cheney M.D.   On: 12/08/2022 15:21   DG Pelvis Portable  Result Date: 11/27/2022 CLINICAL DATA:  Fall, right side pain EXAM: PORTABLE PELVIS 1-2 VIEWS COMPARISON:  None Available. FINDINGS: No acute bony abnormality. Specifically, no fracture, subluxation, or dislocation. Hip joints and SI joints symmetric. Degenerative changes in the visualized lower lumbar spine. IMPRESSION: No acute bony abnormality. Electronically Signed   By: Charlett Nose M.D.   On: 11/27/2022 23:17   DG Chest Port 1 View  Result Date: 11/27/2022 CLINICAL DATA:  Trauma EXAM: PORTABLE CHEST 1 VIEW COMPARISON:  07/13/2022 FINDINGS: Postsurgical changes of the right thorax with chronic scarring and volume loss. Mild cardiomegaly. No acute airspace disease or pneumothorax. IMPRESSION: No active disease. Postsurgical changes of the right thorax. Electronically Signed   By: Jasmine Pang M.D.   On: 11/27/2022 23:16   CT CERVICAL SPINE WO CONTRAST  Result Date: 11/27/2022 CLINICAL DATA:  Larey Seat face forward and bent over trying to her grandson. Daughter witnessed fall. Hit forehead with  multiple skin tears. Patient is on Eliquis. EXAM: CT CERVICAL SPINE WITHOUT CONTRAST TECHNIQUE: Multidetector CT imaging of the cervical spine was performed without intravenous contrast. Multiplanar CT image reconstructions were also generated. RADIATION DOSE REDUCTION: This exam was performed according to the departmental dose-optimization program which includes automated exposure control, adjustment of the mA and/or kV according to patient size and/or use of iterative reconstruction technique. COMPARISON:  None Available. FINDINGS: Alignment: No evidence of traumatic malalignment. Mild  anterolisthesis of C5 chronic. Skull base and vertebrae: No acute fracture. No primary bone lesion or focal pathologic process. Soft tissues and spinal canal: No prevertebral fluid or swelling. No visible canal hematoma. Disc levels: Mild age related spondylosis and facet arthropathy. No severe spinal canal Upper chest: No acute abnormality. Other: Carotid calcification. IMPRESSION: No cervical spine fracture. Electronically Signed   By: Minerva Fester M.D.   On: 11/27/2022 23:12   CT HEAD WO CONTRAST  Result Date: 11/27/2022 CLINICAL DATA:  Trauma EXAM: CT HEAD WITHOUT CONTRAST TECHNIQUE: Contiguous axial images were obtained from the base of the skull through the vertex without intravenous contrast. RADIATION DOSE REDUCTION: This exam was performed according to the departmental dose-optimization program which includes automated exposure control, adjustment of the mA and/or kV according to patient size and/or use of iterative reconstruction technique. COMPARISON:  Maxillofacial CT 09/03/2022.  CT of the head 07/13/2022 FINDINGS: Brain: No evidence of acute infarction, hemorrhage, hydrocephalus, extra-axial collection or mass lesion/mass effect. Again seen is mild diffuse atrophy. Vascular: Atherosclerotic calcifications are present within the cavernous internal carotid arteries. Skull: Normal. Negative for fracture or focal lesion. Sinuses/Orbits: Osseous destruction of the left maxillary sinus wall partially imaged. No air-fluid levels are seen. Otherwise, paranasal sinuses, mastoid air cells and orbits are within normal limits. Other: None. IMPRESSION: No acute intracranial process. Electronically Signed   By: Darliss Cheney M.D.   On: 11/27/2022 23:10    Labs: Basic Metabolic Panel: Recent Labs  Lab 12/18/22 0901 12/20/22 1020 12/20/22 1220 12/20/22 1949 12/21/22 0444 12/22/22 0512  NA 138 139  --  135 135 140  K 3.0* 2.4*  --  3.4* 3.4* 3.4*  CL 98 94*  --  95* 91* 93*  CO2 32 27  --  31 32  34*  GLUCOSE 205* 204*  --  167* 145* 185*  BUN 10 9  --  7* 7* 14  CREATININE 0.72 0.77  --  0.74 0.70 0.84  CALCIUM 8.3* 8.0*  --  7.5* 7.6* 8.1*  MG  --   --  1.5*  --   --   --    CBC: Recent Labs  Lab 12/18/22 0901 12/20/22 1020 12/21/22 0444 12/22/22 0512  WBC 15.3* 16.3* 11.5* 13.6*  NEUTROABS 12.6* 13.3*  --   --   HGB 7.5* 9.6* 8.7* 8.1*  HCT 25.6* 33.0* 31.2* 28.7*  MCV 81.5 82.7 85.0 85.2  PLT 494* 477* 394 387   Microbiology: Results for orders placed or performed during the hospital encounter of 12/20/22  Blood culture (routine x 2)     Status: None (Preliminary result)   Collection Time: 12/20/22 12:20 PM   Specimen: BLOOD  Result Value Ref Range Status   Specimen Description   Final    BLOOD BLOOD LEFT FOREARM Performed at [facility], [address], [place], [place] [id number]    Special Requests   Final    BOTTLES DRAWN AEROBIC AND ANAEROBIC Blood Culture adequate volume Performed at [facility],  [address], [place], [place] [address]    Culture   Final    NO GROWTH 4 DAYS Performed at [facility], [address], [place], [place] [address]    Report Status PENDING  Incomplete  Culture, blood (Routine X 2) w Reflex to ID Panel     Status: None (Preliminary result)   Collection Time: 12/20/22  7:49 PM   Specimen: BLOOD LEFT HAND  Result Value Ref Range Status   Specimen Description   Final    BLOOD LEFT HAND Performed at [facility], [address] [place], [place] [address]    Special Requests   Final    BOTTLES DRAWN AEROBIC ONLY Blood Culture results may not be optimal due to an inadequate volume of blood received in culture bottles Performed at [facility], [address], [place], [place] [address]    Culture   Final    NO GROWTH 3 DAYS Performed at [facility], [address] [place], [place] [address]    Report Status PENDING  Incomplete     Time coordinating discharge: Over 30 minutes  Leeroy Bock, MD  [facility] 12/22/2022, 4:51 PM

## 2022-12-22 NOTE — Plan of Care (Signed)
  Problem: Cardiac: Goal: Will achieve and/or maintain adequate cardiac output Outcome: Progressing   Problem: Clinical Measurements: Goal: Ability to maintain clinical measurements within normal limits will improve Outcome: Progressing Goal: Will remain free from infection Outcome: Progressing   Problem: Nutrition: Goal: Adequate nutrition will be maintained Outcome: Progressing   Problem: Pain Management: Goal: General experience of comfort will improve Outcome: Progressing   Problem: Safety: Goal: Ability to remain free from injury will improve Outcome: Progressing   Problem: Physical Regulation: Goal: Complications related to the disease process, condition or treatment will be avoided or minimized Outcome: Progressing

## 2022-12-22 NOTE — Progress Notes (Signed)
Rounding Note    Patient Name: [name] Date of Encounter: 12/22/2022  [facility] Cardiologist: Thurmon Fair, MD   Subjective   Patient was resting.  His daughter was by the bedside.  Interval Hx His echocardiogram showed normal LV function  Inpatient Medications    Scheduled Meds:  diltiazem  240 mg Oral Daily   famotidine  20 mg Oral Daily   feeding supplement  237 mL Oral TID BM   hydrocortisone  20 mg Oral Daily   hydrocortisone  5 mg Oral Daily   levothyroxine  150 mcg Oral QAC breakfast   loratadine  10 mg Oral Daily   megestrol  20 mg Oral Daily   metoprolol tartrate  25 mg Oral BID   multivitamin with minerals  1 tablet Oral Daily   posaconazole  300 mg Oral Daily   potassium chloride  40 mEq Oral Daily   potassium chloride  40 mEq Oral BID   sodium chloride flush  3 mL Intravenous Q12H   Continuous Infusions:  PRN Meds: acetaminophen **OR** acetaminophen, diazepam, ondansetron **OR** ondansetron (ZOFRAN) IV   Vital Signs    Vitals:   12/22/22 0015 12/22/22 0409 12/22/22 0500 12/22/22 0832  BP: 116/62 129/65  (!) 143/80  Pulse: 70 73  84  Resp: 18 18  18   Temp: 98.8 F (37.1 C) 98.4 F (36.9 C)  98.8 F (37.1 C)  TempSrc:  Oral  Oral  SpO2: 95% 94%  94%  Weight:   68.9 kg     Intake/Output Summary (Last 24 hours) at 12/22/2022 1153 Last data filed at 12/22/2022 0800 Gross per 24 hour  Intake --  Output 450 ml  Net -450 ml      12/22/2022    5:00 AM 12/21/2022    5:00 AM 12/08/2022   11:46 AM  Last 3 Weights  Weight (lbs) 151 lb 14.4 oz 156 lb 4.9 oz 161 lb  Weight (kg) 68.9 kg 70.9 kg 73.029 kg      Telemetry    Sinus rhythm with PVCs- Personally Reviewed  ECG    No new- Personally Reviewed  Physical Exam   Vitals:   12/22/22 0832 12/22/22 1241  BP: (!) 143/80 131/71  Pulse: 84 86  Resp: 18 16  Temp: 98.8 F (37.1 C) 98.4 F (36.9 C)  SpO2: 94% 94%    GEN: No acute distress.  Asleep .  Lying flat Neck:  No JVD Cardiac: RRR, no murmurs, rubs, or gallops.  Respiratory: Clear to auscultation bilaterally. GI: Soft, nontender, non-distended  MS: No edema; No deformity. Neuro:  Nonfocal  Psych: Normal affect   Labs    High Sensitivity Troponin:   Recent Labs  Lab 12/20/22 1220 12/20/22 1500  TROPONINIHS 22* 24*     Chemistry Recent Labs  Lab 12/18/22 0901 12/18/22 0901 12/20/22 1020 12/20/22 1220 12/20/22 1949 12/21/22 0444 12/22/22 0512  NA 138  --  139  --  135 135 140  K 3.0*  --  2.4*  --  3.4* 3.4* 3.4*  CL 98  --  94*  --  95* 91* 93*  CO2 32  --  27  --  31 32 34*  GLUCOSE 205*  --  204*  --  167* 145* 185*  BUN 10  --  9  --  7* 7* 14  CREATININE 0.72   < > 0.77  --  0.74 0.70 0.84  CALCIUM 8.3*  --  8.0*  --  7.5* 7.6* 8.1*  MG  --   --   --  1.5*  --   --   --   PROT 6.2*  --  6.4*  --   --  5.6*  --   ALBUMIN 2.8*  --  2.3*  --   --  2.0*  --   AST 8*  --  13*  --   --  10*  --   ALT 8  --  12  --   --  11  --   ALKPHOS 140*  --  123  --   --  113  --   BILITOT 0.5  --  0.7  --   --  0.6  --   GFRNONAA >60   < > >60  --  >60 >60 >60  ANIONGAP 8  --  18*  --  9 12 13    < > = values in this interval not displayed.    Lipids No results for input(s): "CHOL", "TRIG", "HDL", "LABVLDL", "LDLCALC", "CHOLHDL" in the last 168 hours.  Hematology Recent Labs  Lab 12/20/22 1020 12/21/22 0444 12/22/22 0512  WBC 16.3* 11.5* 13.6*  RBC 3.99* 3.67* 3.37*  HGB 9.6* 8.7* 8.1*  HCT 33.0* 31.2* 28.7*  MCV 82.7 85.0 85.2  MCH 24.1* 23.7* 24.0*  MCHC 29.1* 27.9* 28.2*  RDW 19.5* 19.3* 19.4*  PLT 477* 394 387   Thyroid No results for input(s): "TSH", "FREET4" in the last 168 hours.  BNP Recent Labs  Lab 12/18/22 0901 12/20/22 1020  BNP 841.8* 569.6*    DDimer No results for input(s): "DDIMER" in the last 168 hours.   Radiology    ECHOCARDIOGRAM COMPLETE  Result Date: 12/21/2022    ECHOCARDIOGRAM REPORT   Patient Name:   [name] Date of Exam: 12/21/2022  Medical Rec #:  [id number]  Height:       68.0 in Accession #:    [id number] Weight:       156.3 lb Date of Birth:  22-Dec-1945  BSA:          1.840 m Patient Age:    77 years   BP:           142/85 mmHg Patient Gender: M          HR:           106 bpm. Exam Location:  Inpatient Procedure: 2D Echo, Cardiac Doppler, Color Doppler and Intracardiac            Opacification Agent Indications:    Syncope R55  History:        Patient has prior history of Echocardiogram examinations, most                 recent 03/02/2022. CHF, CAD, Arrythmias:Atrial Fibrillation,                 Signs/Symptoms:Syncope; Risk Factors:Hypertension and                 Dyslipidemia. CKD.  Sonographer:    Lucendia Herrlich RCS Referring Phys: [phone] DAVID MANUEL ORTIZ  Sonographer Comments: Image acquisition challenging due to patient body habitus. IMPRESSIONS  1. Left ventricular ejection fraction, by estimation, is 55 to 60%. The left ventricle has normal function. The left ventricle has no regional wall motion abnormalities. Left ventricular diastolic parameters are consistent with Grade I diastolic dysfunction (impaired relaxation).  2. Right ventricular systolic function is normal. The right ventricular size is normal.  3. The mitral  valve is normal in structure. Trivial mitral valve regurgitation. No evidence of mitral stenosis.  4. The aortic valve is tricuspid. There is mild calcification of the aortic valve. Aortic valve regurgitation is not visualized. Aortic valve sclerosis/calcification is present, without any evidence of aortic stenosis.  5. The inferior vena cava is normal in size with greater than 50% respiratory variability, suggesting right atrial pressure of 3 mmHg. FINDINGS  Left Ventricle: Left ventricular ejection fraction, by estimation, is 55 to 60%. The left ventricle has normal function. The left ventricle has no regional wall motion abnormalities. Definity contrast agent was given IV to delineate the left ventricular   endocardial borders. The left ventricular internal cavity size was normal in size. There is no left ventricular hypertrophy. Left ventricular diastolic parameters are consistent with Grade I diastolic dysfunction (impaired relaxation). Right Ventricle: The right ventricular size is normal. No increase in right ventricular wall thickness. Right ventricular systolic function is normal. Left Atrium: Left atrial size was normal in size. Right Atrium: Right atrial size was normal in size. Pericardium: There is no evidence of pericardial effusion. Mitral Valve: The mitral valve is normal in structure. Mild mitral annular calcification. Trivial mitral valve regurgitation. No evidence of mitral valve stenosis. Tricuspid Valve: The tricuspid valve is normal in structure. Tricuspid valve regurgitation is not demonstrated. No evidence of tricuspid stenosis. Aortic Valve: The aortic valve is tricuspid. There is mild calcification of the aortic valve. Aortic valve regurgitation is not visualized. Aortic valve sclerosis/calcification is present, without any evidence of aortic stenosis. Aortic valve peak gradient measures 5.0 mmHg. Pulmonic Valve: The pulmonic valve was not well visualized. Pulmonic valve regurgitation is not visualized. No evidence of pulmonic stenosis. Aorta: The ascending aorta was not well visualized and the aortic root was not well visualized. Venous: The inferior vena cava is normal in size with greater than 50% respiratory variability, suggesting right atrial pressure of 3 mmHg. IAS/Shunts: No atrial level shunt detected by color flow Doppler.  LEFT VENTRICLE PLAX 2D LVIDd:         4.30 cm   Diastology LVIDs:         3.70 cm   LV e' medial:    6.20 cm/s LV PW:         0.70 cm   LV E/e' medial:  14.6 LV IVS:        0.90 cm   LV e' lateral:   8.27 cm/s LVOT diam:     2.10 cm   LV E/e' lateral: 11.0 LV SV:         41 LV SV Index:   22 LVOT Area:     3.46 cm  RIGHT VENTRICLE             IVC RV S prime:      12.10 cm/s  IVC diam: 1.90 cm TAPSE (M-mode): 1.3 cm LEFT ATRIUM             Index        RIGHT ATRIUM          Index LA diam:        2.60 cm 1.41 cm/m   RA Area:     7.10 cm LA Vol (A2C):   37.8 ml 20.54 ml/m  RA Volume:   8.04 ml  4.37 ml/m LA Vol (A4C):   25.8 ml 14.02 ml/m LA Biplane Vol: 34.0 ml 18.47 ml/m  AORTIC VALVE AV Area (Vmax): 2.20 cm AV Vmax:  112.00 cm/s AV Peak Grad:   5.0 mmHg LVOT Vmax:      71.10 cm/s LVOT Vmean:     46.000 cm/s LVOT VTI:       0.119 m MITRAL VALVE                TRICUSPID VALVE MV Area (PHT): 6.27 cm     TR Peak grad:   10.9 mmHg MV Decel Time: 121 msec     TR Vmax:        165.00 cm/s MR Peak grad: 19.0 mmHg MR Vmax:      218.00 cm/s   SHUNTS MV E velocity: 90.60 cm/s   Systemic VTI:  0.12 m MV A velocity: 113.00 cm/s  Systemic Diam: 2.10 cm MV E/A ratio:  0.80 Arvilla Meres MD Electronically signed by Arvilla Meres MD Signature Date/Time: 12/21/2022/10:31:26 AM    Final    DG Pelvis Portable  Result Date: 12/20/2022 CLINICAL DATA:  Multiple falls EXAM: PORTABLE PELVIS 1-2 VIEWS COMPARISON:  11/27/2022 FINDINGS: There is no evidence of pelvic fracture or diastasis. No fracture or dislocation of the hips. No pelvic bone lesions are seen. IMPRESSION: Negative. Electronically Signed   By: Duanne Guess D.O.   On: 12/20/2022 18:53    Cardiac Studies   TTE 12/21/2022   1. Left ventricular ejection fraction, by estimation, is 55 to 60%. The left ventricle has normal function. The left ventricle has no regional wall motion abnormalities. Left ventricular diastolic parameters are consistent with Grade I diastolic  dysfunction (impaired relaxation).   2. Right ventricular systolic function is normal. The right ventricular size is normal.   3. The mitral valve is normal in structure. Trivial mitral valve  regurgitation. No evidence of mitral stenosis.   4. The aortic valve is tricuspid. There is mild calcification of the  aortic valve. Aortic valve  regurgitation is not visualized. Aortic valve  sclerosis/calcification is present, without any evidence of aortic  stenosis.   5. The inferior vena cava is normal in size with greater than 50%  respiratory variability, suggesting right atrial pressure of 3 mmHg.   Patient Profile     Garvin Ellena is a 77 y.o. male with a hx of PAF on eliquis, stage IIIa chronic kidney disease, history of renal call carcinoma, history of lung cancer, right nephrectomy, CAD, GERD, HLD, HTN, hypothyroidism who is being seen 12/21/2022 for the evaluation of syncope/near syncope at the request of Dr. Dareen Piano. Mr. Ivins comes in with frequent falls. He has had prior cardiac monitor workup with no etiology for cardiac syncope.   Assessment & Plan     Syncope PVCs Orthostasis FTT Mr. Brue comes in with frequent falls. He has had prior cardiac monitor workup with no etiology for cardiac syncope. He has had a prior echo with normal LV function and no significant valvular disease. He is a patient of Dr. Rachelle Hora Croitoru. He is admitted here with another fall. His telemetry shows sinus rhythm with frequent PVCs. He did not describe any chest pressure with low concern for acute ischemic disease. The etiology of his falls sounds like orthostasis. He has not been eating or drinking consistently with some cognitive decline.  -His echocardiogram showed normal LV function -He still has persistent PVCs.  Will plan to increase his metoprolol dose and consolidated to metoprolol XL 100 mg daily  Otherwise, no other workup for his syncope is recommended from a cardiac perspective.  We will work on follow-up for him.  Cardiology will  sign off but please do not hesitate to reach out for further questions.    For questions or updates, please contact [facility] Please consult [email or web address] for contact info under        Signed, Maisie Fus, MD  12/22/2022, 11:53 AM

## 2022-12-22 NOTE — Progress Notes (Signed)
Mobility Specialist - Progress Note   12/22/22 1000  Mobility  Activity Ambulated with assistance to bathroom;Transferred from chair to bed  Level of Assistance Contact guard assist, steadying assist  Assistive Device Front wheel walker  Distance Ambulated (ft) 19 ft  Range of Motion/Exercises Active  Activity Response Tolerated well  Mobility Referral Yes  $Mobility charge 1 Mobility  Mobility Specialist Start Time (ACUTE ONLY) 1000  Mobility Specialist Stop Time (ACUTE ONLY) 1014  Mobility Specialist Time Calculation (min) (ACUTE ONLY) 14 min   Received in chair requesting to go back to bed, upon standing pt requested to use restroom. After restroom pt returned to bed with all needs met. Alarm on and family in room.  Marilynne Halsted Mobility Specialist

## 2022-12-22 NOTE — TOC Initial Note (Signed)
Transition of Care [facility]) - Initial/Assessment Note    Patient Details  Name: [name] MRN: [id number] Date of Birth: 28-May-1945  [facility] CM/SW Contact:    Otelia Santee, LCSW Phone Number: 12/22/2022, 3:18 PM  Clinical Narrative:                 Met with pt in room to discuss discharge plans. Pt providing limited engagement in conversation and unable to determine if he would like HH or not. Pt agreeable for CSW to reach out to daughter for DC planning.  CSW spoke with pt's daughter who has been providing 24/7 care for pt at home. She is agreeable to [facility] being arranged and does not have preference for agency. She shares pt has not had HH services in the past.  HHPT/OT has been arranged with [facility]. HH orders will need to be placed prior to discharge.  Expected Discharge Plan: Home w Home Health Services Barriers to Discharge: No Barriers Identified   Patient Goals and CMS Choice Patient states their goals for this hospitalization and ongoing recovery are:: "To go home" CMS Medicare.gov Compare Post Acute Care list provided to:: Patient Choice offered to / list presented to : Patient [facility] ownership interest in [facility].provided to:: Patient    Expected Discharge Plan and Services In-house Referral: Clinical Social Work Discharge Planning Services: NA Post Acute Care Choice: Home Health Living arrangements for the past 2 months: Single Family Home                 DME Arranged: N/A DME Agency: NA                  Prior Living Arrangements/Services Living arrangements for the past 2 months: Single Family Home Lives with:: Adult Children Patient language and need for interpreter reviewed:: Yes Do you feel safe going back to the place where you live?: Yes      Need for Family Participation in Patient Care: Yes (Comment) Care giver support system in place?: Yes (comment) Current home services: DME Criminal Activity/Legal Involvement  Pertinent to Current Situation/Hospitalization: No - Comment as needed  Activities of Daily Living   ADL Screening (condition at time of admission) Independently performs ADLs?: Yes (appropriate for developmental age) Is the patient deaf or have difficulty hearing?: Yes Does the patient have difficulty seeing, even when wearing glasses/contacts?: No Does the patient have difficulty concentrating, remembering, or making decisions?: No  Permission Sought/Granted Permission sought to share information with : Facility Medical sales representative, Family Supports Permission granted to share information with : Yes, Verbal Permission Granted  Share Information with NAME: Theora Gianotti  Permission granted to share info w AGENCY: [facility]  Permission granted to share info w Relationship: Daughter  Permission granted to share info w Contact Information: [phone]  Emotional Assessment Appearance:: Appears stated age Attitude/Demeanor/Rapport: Avoidant, Uncooperative Affect (typically observed): Flat Orientation: : Oriented to Self, Oriented to Place, Oriented to  Time, Oriented to Situation Alcohol / Substance Use: Not Applicable Psych Involvement: No (comment)  Admission diagnosis:  Syncope [R55] Patient Active Problem List   Diagnosis Date Noted   Malnutrition of moderate degree 12/22/2022   Head injury 12/21/2022   Syncope 12/20/2022   Thrombocytosis 12/20/2022   Frequent falls 12/20/2022   Hypomagnesemia 12/20/2022   Grade I diastolic dysfunction 12/20/2022   Hypokalemia 12/09/2022   Iron deficiency anemia 12/08/2022   SIRS (systemic inflammatory response syndrome) (HCC) 12/08/2022   Paroxysmal atrial fibrillation (HCC) 12/08/2022  Anxiety 06/26/2022   Adrenal disease (HCC) 06/25/2022   Medication management 06/25/2022   Anemia of chronic disease 06/24/2022   Hyponatremia 06/24/2022   Palliative care encounter 06/24/2022   Goals of care, counseling/discussion 06/24/2022    Counseling and coordination of care 06/24/2022   Need for emotional support 06/24/2022   Poor appetite 06/24/2022   Invasive fungal sinusitis 06/10/2022   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) 06/10/2022   Maxillary sinusitis 05/22/2022   Jaw pain 03/31/2022   Adrenal insufficiency (HCC) 08/23/2018   Atherosclerosis of aorta (HCC) 08/23/2018   Acquired hypothyroidism 04/19/2018   Secondary adrenal insufficiency (HCC) 04/19/2018   Acute hyponatremia 03/24/2018   Essential hypertension    Protein-calorie malnutrition, severe 03/14/2018   Generalized weakness 03/13/2018   Hyperlipidemia 11/30/2017   Kidney cancer, primary, with metastasis from kidney to other site [facility]) 09/14/2017   Recurrent umbilical hernia 08/01/2015   Incisional hernia 08/01/2015   Hypertensive heart disease without CHF    GERD (gastroesophageal reflux disease)    Chronic kidney disease    Coronary artery disease    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 [place] [address] Phone: [phone] Fax: [phone]     Social Determinants of Health (SDOH) Social History: SDOH Screenings   Food Insecurity: No Food Insecurity (12/21/2022)  Housing: Low Risk  (12/21/2022)  Transportation Needs: No Transportation Needs (12/21/2022)  Utilities: Not At Risk (12/21/2022)  Depression (PHQ2-9): Low Risk  (10/12/2022)  Social Connections: Unknown (06/30/2021)   Received from [facility]  Tobacco Use: Medium Risk (12/21/2022)   SDOH Interventions:      Readmission Risk Interventions    12/09/2022   11:03 AM  Readmission Risk Prevention Plan  Transportation Screening Complete  Medication Review (RN Care Manager) Complete  PCP or Specialist appointment within 3-5 days of discharge Complete  HRI or Home Care Consult Complete  SW Recovery Care/Counseling Consult Complete  Palliative Care Screening Not Applicable  Skilled Nursing Facility Not Applicable

## 2022-12-22 NOTE — Evaluation (Signed)
Physical Therapy Evaluation Patient Details Name: [name] MRN: [id number] DOB: 05/23/1945 Today's Date: 12/22/2022  History of Present Illness  Pt is a 77 year old man admitted on 11/3 with frequent falls, syncope/near syncope. Pt +hypokalemia, hypomagnesemia. Daughter reports cognitive decline and decreased PO intake x 4-6 weeks. PMH: OA, CKD III, PAF, R nephrectomy due to cancer, lung ca, PNA, CAD, heart murmur, HLD, HTN, hypothyroidism, incisional hernia, secondary adrenal insufficiency.  Clinical Impression  Pt admitted with above diagnosis. Pt from home with daughters staying with him 24/7 since last hospital admission, using rollator for ambulation, daughter assists with self care as needed and completes household chores. Pt needing increased time with mobility, CGA for transfers and amb using RW. Pt denies dizziness, lightheadedness, pain throughout evaluation. Recommend HHPT at d/c. Pt currently with functional limitations due to the deficits listed below (see PT Problem List). Pt will benefit from acute skilled PT to increase their independence and safety with mobility to allow discharge.           If plan is discharge home, recommend the following: A little help with walking and/or transfers;A little help with bathing/dressing/bathroom;Assistance with cooking/housework;Help with stairs or ramp for entrance   Can travel by private vehicle        Equipment Recommendations None recommended by PT  Recommendations for Other Services       Functional Status Assessment Patient has had a recent decline in their functional status and demonstrates the ability to make significant improvements in function in a reasonable and predictable amount of time.     Precautions / Restrictions Precautions Precautions: Fall Restrictions Weight Bearing Restrictions: No      Mobility  Bed Mobility Overal bed mobility: Needs Assistance Bed Mobility: Supine to Sit     Supine to sit:  Supervision, Used rails     General bed mobility comments: heavy use of bedrail, supv while coming to sit EOB    Transfers Overall transfer level: Needs assistance Equipment used: Rolling walker (2 wheels) Transfers: Sit to/from Stand Sit to Stand: Contact guard assist           General transfer comment: CGA for steadying to power up, verbal cues to push from seated surface as pt prefers to pull from RW    Ambulation/Gait Ambulation/Gait assistance: Contact guard assist Gait Distance (Feet): 130 Feet Assistive device: Rolling walker (2 wheels) Gait Pattern/deviations: Step-through pattern, Decreased stride length Gait velocity: decreased     General Gait Details: step-through gait pattern with RW, decreased cadence, increased time for turns and verbal cues for RW management  Stairs            Wheelchair Mobility     Tilt Bed    Modified Rankin (Stroke Patients Only)       Balance Overall balance assessment: Mild deficits observed, not formally tested                                           Pertinent Vitals/Pain Pain Assessment Pain Assessment: No/denies pain    Home Living Family/patient expects to be discharged to:: Private residence Living Arrangements: Alone Available Help at Discharge: Family;Available 24 hours/day (daughter has been staying with pt) Type of Home: House Home Access: Stairs to enter Entrance Stairs-Rails: Right Entrance Stairs-Number of Steps: 4   Home Layout: One level Home Equipment: Cane - single point;Educational psychologist (4 wheels)  Prior Function Prior Level of Function : Needs assist;Independent/Modified Independent             Mobility Comments: pt ind with rollator, multiple falls needing daughter's assist to get up ADLs Comments: daughter assists as needed with dressing; daughter present in home as pt takes shower, uses restroom; daughter completes household chores     Extremity/Trunk  Assessment   Upper Extremity Assessment Upper Extremity Assessment: Defer to OT evaluation    Lower Extremity Assessment Lower Extremity Assessment: Generalized weakness (AROM WFL, strength grossly 3+/5, symmetrical)    Cervical / Trunk Assessment Cervical / Trunk Assessment: Normal  Communication      Cognition Arousal: Alert Behavior During Therapy: Flat affect Overall Cognitive Status: Within Functional Limits for tasks assessed                                 General Comments: pt follows commands, daughter in room reports pt having difficulty sleeping        General Comments      Exercises     Assessment/Plan    PT Assessment Patient needs continued PT services  PT Problem List Decreased strength;Decreased activity tolerance;Decreased balance;Decreased mobility;Decreased knowledge of use of DME       PT Treatment Interventions DME instruction;Gait training;Stair training;Functional mobility training;Therapeutic activities;Therapeutic exercise;Balance training;Neuromuscular re-education;Patient/family education    PT Goals (Current goals can be found in the Care Plan section)  Acute Rehab PT Goals Patient Stated Goal: return home PT Goal Formulation: With patient/family Time For Goal Achievement: 01/05/23 Potential to Achieve Goals: Good    Frequency Min 1X/week     Co-evaluation               AM-PAC PT "6 Clicks" Mobility  Outcome Measure Help needed turning from your back to your side while in a flat bed without using bedrails?: A Little Help needed moving from lying on your back to sitting on the side of a flat bed without using bedrails?: A Little Help needed moving to and from a bed to a chair (including a wheelchair)?: A Little Help needed standing up from a chair using your arms (e.g., wheelchair or bedside chair)?: A Little Help needed to walk in hospital room?: A Little Help needed climbing 3-5 steps with a railing? : A Lot 6  Click Score: 17    End of Session Equipment Utilized During Treatment: Gait belt Activity Tolerance: Patient tolerated treatment well Patient left: in chair;with call bell/phone within reach;with chair alarm set;with family/visitor present Nurse Communication: Mobility status PT Visit Diagnosis: Muscle weakness (generalized) (M62.81);Other abnormalities of gait and mobility (R26.89);History of falling (Z91.81)    Time: 1610-9604 PT Time Calculation (min) (ACUTE ONLY): 21 min   Charges:   PT Evaluation $PT Eval Moderate Complexity: 1 Mod   PT General Charges $$ ACUTE PT VISIT: 1 Visit         Tori Wilmoth Rasnic PT, DPT 12/22/22, 12:40 PM

## 2022-12-22 NOTE — Progress Notes (Incomplete)
PROGRESS NOTE  [name]    DOB: [birth year], 77 y.o.  AOZ:[id number]    Code Status: Full Code   DOA: 12/20/2022   LOS: 0   Brief hospital course  [name] is a 77 y.o. male with medical history significant of osteoarthritis, stage IIIa chronic kidney disease, right nephrectomy, history of renal cell carcinoma, history of lung cancer, no metastasis, history of multiple episodes of pneumonia, CAD, GERD, heart murmur, hyperlipidemia, hypertension, hypothyroidism, incisional hernia, secondary adrenal insufficiency. They were brought to the emergency department due to having a fall at home with right-sided frontal head injury while sitting on the toilet and bending forward. This is consistent with his other prior syncopal episodes.    Lab work: Urinalysis showed glucose of 50 and protein of 100 mg/dL.  As read bacteria microscopic examination.  CBC is her white count 16.3, hemoglobin 9.6 g/dL platelets 962.  Troponin was 22 then 24 ng/L and BNP 569.6 pg/mL.  Lactic acid was normal.  Magnesium was 1.5 mg/dL.  CMP showed potassium of 24 and chloride 94 mmol/L, the rest of the electrolytes were normal after calcium correction.  Normal renal function.  AST 13 units/L, total protein 6.4 and albumin 2.3 g/dL.  The rest of the LFTs were unremarkable.   Imaging: 2 view chest radiograph showing stable chest with status post right middle and lower lobectomy.  No evidence of acute cardiopulmonary process.   CT head without contrast with no acute intracranial normality.  There was atrophy with chronic small vessel white matter ischemic disease.   CT cervical spine with no fracture or traumatic subluxation.   ED course: Initial vital signs were temperature 98.6 F, pulse 110, respiration 18, BP 144/81 mmHg O2 sat 96% on room air.  The patient received LR 1000 bolus, magnesium sulfate 2 g IVPB and KCl 40 mEq p.o. x 1.  Patient was admitted to medicine service for further workup and management of syncope as  outlined in detail below.  12/22/22 -thus far, workup overall unremarkable. Cardiology following to cardiac etiology evaluation  Assessment & Plan  Principal Problem:   Syncope Active Problems:   Hypokalemia   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   GERD (gastroesophageal reflux disease)   Coronary artery disease   Hyperlipidemia   Protein-calorie malnutrition, severe   Essential hypertension   Acquired hypothyroidism   Adrenal insufficiency (HCC)   Atherosclerosis of aorta (HCC)   Invasive fungal sinusitis   Anemia of chronic disease   Paroxysmal atrial fibrillation (HCC)   Thrombocytosis   Frequent falls   Hypomagnesemia   Grade I diastolic dysfunction   Head injury  Syncope  frequent falls- likely orthostatic as well as vagal component given the history of multiple happening on the commode and/or while bending over.  - continue telemetry - echo pending - cardiology following-   - increased metoprolol  - stopped eliquis for frequent falls and inconsistent use  - increase protein intake  - replace electrolytes PRN   Coronary artery disease   Grade I diastolic dysfunction  HTN- BNP mildly elevated - now off apixaban.  Can consider starting asa - diuretic PRN - Continue Cardizem 240 mg p.o. daily.     Paroxysmal atrial fibrillation CHA?DS?-VASc Score of at least 5. Continue diltiazem 240 mg p.o. daily. stop apixaban 2.5 mg p.o. twice daily.      Hypokalemia- improving.  2.4>3.4 Secondary to furosemide use. - monitor and replete PRN     Hypomagnesemia  Secondary  to diuretic. Discussed with the daughter and the need for regular supplement.     Protein-calorie malnutrition, severe In the setting of anemia and malignancy. - dietitian consult   Adrenal insufficiency (HCC) Continue hydrocortisone 20 mg in AM. Continue hydrocortisone 5 mg in p.m. Per last endocrinology endocrinology AM dose to be tapered to 15 mg. -However, it seems  like he has been taking 20 mg. -Will defer taper after discharge when he is in less physical stress.     Invasive fungal sinusitis Continue posaconazole 300 mg p.o. daily. Follow-up with infectious diseases as an outpatient.    Acquired hypothyroidism Continue levothyroxine 150 mcg p.o. daily.     Anemia of chronic disease  thrombocytosis- hgb improved to 9.6 s/p blood transfusion and decreased to 8.7 today. No sign of active bleeding - CBC am     GERD (gastroesophageal reflux disease) Antiacid, H2 blocker or PPI as needed.  Body mass index is 23.1 kg/m.  VTE ppx:    Diet:     Diet   Diet Heart Fluid consistency: Thin; Fluid restriction: 1200 mL Fluid   Consultants: Cardiology   Subjective 12/22/22    Pt reports no complaints. Feels well. Daughter at bedside   Objective   Vitals:   12/21/22 1951 12/22/22 0015 12/22/22 0409 12/22/22 0500  BP: 124/74 116/62 129/65   Pulse: 79 70 73   Resp: 18 18 18    Temp: 98.4 F (36.9 C) 98.8 F (37.1 C) 98.4 F (36.9 C)   TempSrc:   Oral   SpO2: 97% 95% 94%   Weight:    68.9 kg    Intake/Output Summary (Last 24 hours) at 12/22/2022 0737 Last data filed at 12/21/2022 2300 Gross per 24 hour  Intake --  Output 200 ml  Net -200 ml   Filed Weights   12/21/22 0500 12/22/22 0500  Weight: 70.9 kg 68.9 kg     Physical Exam:  General: awake, alert, NAD HEENT: atraumatic, clear conjunctiva, anicteric sclera, MMM, hard of hearing Respiratory: normal respiratory effort. Cardiovascular: quick capillary refill, normal S1/S2, RRR, no JVD, murmurs Nervous: A&O x3. no gross focal neurologic deficits, normal speech Extremities: mild edema, normal tone Skin: dry, intact, normal temperature, normal color. No rashes, lesions or ulcers on exposed skin Psychiatry: normal mood, congruent affect  Labs   I have personally reviewed the following labs and imaging studies CBC    Component Value Date/Time   WBC 13.6 (H) 12/22/2022 0512    RBC 3.37 (L) 12/22/2022 0512   HGB 8.1 (L) 12/22/2022 0512   HGB 7.5 (L) 12/18/2022 0901   HGB 13.3 03/11/2018 0913   HCT 28.7 (L) 12/22/2022 0512   HCT 40.1 03/11/2018 0913   PLT 387 12/22/2022 0512   PLT 494 (H) 12/18/2022 0901   PLT 248 03/11/2018 0913   MCV 85.2 12/22/2022 0512   MCV 86 03/11/2018 0913   MCH 24.0 (L) 12/22/2022 0512   MCHC 28.2 (L) 12/22/2022 0512   RDW 19.4 (H) 12/22/2022 0512   RDW 15.6 (H) 03/11/2018 0913   LYMPHSABS 1.4 12/20/2022 1020   LYMPHSABS 3.0 03/11/2018 0913   MONOABS 1.3 (H) 12/20/2022 1020   EOSABS 0.1 12/20/2022 1020   EOSABS 0.7 (H) 03/11/2018 0913   BASOSABS 0.0 12/20/2022 1020   BASOSABS 0.1 03/11/2018 0913      Latest Ref Rng & Units 12/22/2022    5:12 AM 12/21/2022    4:44 AM 12/20/2022    7:49 PM  BMP  Glucose 70 -  99 mg/dL 454  098  119   BUN 8 - 23 mg/dL 14  7  7    Creatinine 0.61 - 1.24 mg/dL 1.47  8.29  5.62   Sodium 135 - 145 mmol/L 140  135  135   Potassium 3.5 - 5.1 mmol/L 3.4  3.4  3.4   Chloride 98 - 111 mmol/L 93  91  95   CO2 22 - 32 mmol/L 34  32  31   Calcium 8.9 - 10.3 mg/dL 8.1  7.6  7.5     ECHOCARDIOGRAM COMPLETE  Result Date: 12/21/2022    ECHOCARDIOGRAM REPORT   Patient Name:   [name] Date of Exam: 12/21/2022 Medical Rec #:  [id number]  Height:       68.0 in Accession #:    [id number] Weight:       156.3 lb Date of Birth:  05/06/1945  BSA:          1.840 m Patient Age:    77 years   BP:           142/85 mmHg Patient Gender: M          HR:           106 bpm. Exam Location:  Inpatient Procedure: 2D Echo, Cardiac Doppler, Color Doppler and Intracardiac            Opacification Agent Indications:    Syncope R55  History:        Patient has prior history of Echocardiogram examinations, most                 recent 03/02/2022. CHF, CAD, Arrythmias:Atrial Fibrillation,                 Signs/Symptoms:Syncope; Risk Factors:Hypertension and                 Dyslipidemia. CKD.  Sonographer:    Lucendia Herrlich RCS Referring  Phys: [phone] DAVID MANUEL ORTIZ  Sonographer Comments: Image acquisition challenging due to patient body habitus. IMPRESSIONS  1. Left ventricular ejection fraction, by estimation, is 55 to 60%. The left ventricle has normal function. The left ventricle has no regional wall motion abnormalities. Left ventricular diastolic parameters are consistent with Grade I diastolic dysfunction (impaired relaxation).  2. Right ventricular systolic function is normal. The right ventricular size is normal.  3. The mitral valve is normal in structure. Trivial mitral valve regurgitation. No evidence of mitral stenosis.  4. The aortic valve is tricuspid. There is mild calcification of the aortic valve. Aortic valve regurgitation is not visualized. Aortic valve sclerosis/calcification is present, without any evidence of aortic stenosis.  5. The inferior vena cava is normal in size with greater than 50% respiratory variability, suggesting right atrial pressure of 3 mmHg. FINDINGS  Left Ventricle: Left ventricular ejection fraction, by estimation, is 55 to 60%. The left ventricle has normal function. The left ventricle has no regional wall motion abnormalities. Definity contrast agent was given IV to delineate the left ventricular  endocardial borders. The left ventricular internal cavity size was normal in size. There is no left ventricular hypertrophy. Left ventricular diastolic parameters are consistent with Grade I diastolic dysfunction (impaired relaxation). Right Ventricle: The right ventricular size is normal. No increase in right ventricular wall thickness. Right ventricular systolic function is normal. Left Atrium: Left atrial size was normal in size. Right Atrium: Right atrial size was normal in size. Pericardium: There is no evidence of pericardial effusion. Mitral Valve: The mitral valve  is normal in structure. Mild mitral annular calcification. Trivial mitral valve regurgitation. No evidence of mitral valve stenosis.  Tricuspid Valve: The tricuspid valve is normal in structure. Tricuspid valve regurgitation is not demonstrated. No evidence of tricuspid stenosis. Aortic Valve: The aortic valve is tricuspid. There is mild calcification of the aortic valve. Aortic valve regurgitation is not visualized. Aortic valve sclerosis/calcification is present, without any evidence of aortic stenosis. Aortic valve peak gradient measures 5.0 mmHg. Pulmonic Valve: The pulmonic valve was not well visualized. Pulmonic valve regurgitation is not visualized. No evidence of pulmonic stenosis. Aorta: The ascending aorta was not well visualized and the aortic root was not well visualized. Venous: The inferior vena cava is normal in size with greater than 50% respiratory variability, suggesting right atrial pressure of 3 mmHg. IAS/Shunts: No atrial level shunt detected by color flow Doppler.  LEFT VENTRICLE PLAX 2D LVIDd:         4.30 cm   Diastology LVIDs:         3.70 cm   LV e' medial:    6.20 cm/s LV PW:         0.70 cm   LV E/e' medial:  14.6 LV IVS:        0.90 cm   LV e' lateral:   8.27 cm/s LVOT diam:     2.10 cm   LV E/e' lateral: 11.0 LV SV:         41 LV SV Index:   22 LVOT Area:     3.46 cm  RIGHT VENTRICLE             IVC RV S prime:     12.10 cm/s  IVC diam: 1.90 cm TAPSE (M-mode): 1.3 cm LEFT ATRIUM             Index        RIGHT ATRIUM          Index LA diam:        2.60 cm 1.41 cm/m   RA Area:     7.10 cm LA Vol (A2C):   37.8 ml 20.54 ml/m  RA Volume:   8.04 ml  4.37 ml/m LA Vol (A4C):   25.8 ml 14.02 ml/m LA Biplane Vol: 34.0 ml 18.47 ml/m  AORTIC VALVE AV Area (Vmax): 2.20 cm AV Vmax:        112.00 cm/s AV Peak Grad:   5.0 mmHg LVOT Vmax:      71.10 cm/s LVOT Vmean:     46.000 cm/s LVOT VTI:       0.119 m MITRAL VALVE                TRICUSPID VALVE MV Area (PHT): 6.27 cm     TR Peak grad:   10.9 mmHg MV Decel Time: 121 msec     TR Vmax:        165.00 cm/s MR Peak grad: 19.0 mmHg MR Vmax:      218.00 cm/s   SHUNTS MV E  velocity: 90.60 cm/s   Systemic VTI:  0.12 m MV A velocity: 113.00 cm/s  Systemic Diam: 2.10 cm MV E/A ratio:  0.80 Arvilla Meres MD Electronically signed by Arvilla Meres MD Signature Date/Time: 12/21/2022/10:31:26 AM    Final    DG Pelvis Portable  Result Date: 12/20/2022 CLINICAL DATA:  Multiple falls EXAM: PORTABLE PELVIS 1-2 VIEWS COMPARISON:  11/27/2022 FINDINGS: There is no evidence of pelvic fracture or diastasis. No fracture or dislocation of the hips.  No pelvic bone lesions are seen. IMPRESSION: Negative. Electronically Signed   By: Duanne Guess D.O.   On: 12/20/2022 18:53   CT Head Wo Contrast  Result Date: 12/20/2022 CLINICAL DATA:  Multiple falls including last night. Hit back of head. Posterior bruising and redness. EXAM: CT HEAD WITHOUT CONTRAST CT CERVICAL SPINE WITHOUT CONTRAST TECHNIQUE: Multidetector CT imaging of the head and cervical spine was performed following the standard protocol without intravenous contrast. Multiplanar CT image reconstructions of the cervical spine were also generated. RADIATION DOSE REDUCTION: This exam was performed according to the departmental dose-optimization program which includes automated exposure control, adjustment of the mA and/or kV according to patient size and/or use of iterative reconstruction technique. COMPARISON:  11/27/2022 FINDINGS: CT HEAD FINDINGS Brain: There is no evidence for acute hemorrhage, hydrocephalus, mass lesion, or abnormal extra-axial fluid collection. No definite CT evidence for acute infarction. Diffuse loss of parenchymal volume is consistent with atrophy. Patchy low attenuation in the deep hemispheric and periventricular white matter is nonspecific, but likely reflects chronic microvascular ischemic demyelination. Vascular: No hyperdense vessel or unexpected calcification. Skull: No evidence for fracture. No worrisome lytic or sclerotic lesion. Sinuses/Orbits: The visualized paranasal sinuses and mastoid air cells  are clear. Visualized portions of the globes and intraorbital fat are unremarkable. Other: None. CT CERVICAL SPINE FINDINGS Alignment: No evidence for traumatic subluxation. Skull base and vertebrae: No acute fracture. No primary bone lesion or focal pathologic process. Soft tissues and spinal canal: No prevertebral fluid or swelling. No visible canal hematoma. Disc levels: Mild loss of disc height noted C6-7 with endplate spurring. Facet degeneration noted on the right at C2-3 and C3-4. Upper chest: Unremarkable. Other: None IMPRESSION: 1. No acute intracranial abnormality. 2. Atrophy with chronic small vessel white matter ischemic disease. 3. No cervical spine fracture or traumatic subluxation. Electronically Signed   By: Kennith Center M.D.   On: 12/20/2022 11:37   CT Cervical Spine Wo Contrast  Result Date: 12/20/2022 CLINICAL DATA:  Multiple falls including last night. Hit back of head. Posterior bruising and redness. EXAM: CT HEAD WITHOUT CONTRAST CT CERVICAL SPINE WITHOUT CONTRAST TECHNIQUE: Multidetector CT imaging of the head and cervical spine was performed following the standard protocol without intravenous contrast. Multiplanar CT image reconstructions of the cervical spine were also generated. RADIATION DOSE REDUCTION: This exam was performed according to the departmental dose-optimization program which includes automated exposure control, adjustment of the mA and/or kV according to patient size and/or use of iterative reconstruction technique. COMPARISON:  11/27/2022 FINDINGS: CT HEAD FINDINGS Brain: There is no evidence for acute hemorrhage, hydrocephalus, mass lesion, or abnormal extra-axial fluid collection. No definite CT evidence for acute infarction. Diffuse loss of parenchymal volume is consistent with atrophy. Patchy low attenuation in the deep hemispheric and periventricular white matter is nonspecific, but likely reflects chronic microvascular ischemic demyelination. Vascular: No  hyperdense vessel or unexpected calcification. Skull: No evidence for fracture. No worrisome lytic or sclerotic lesion. Sinuses/Orbits: The visualized paranasal sinuses and mastoid air cells are clear. Visualized portions of the globes and intraorbital fat are unremarkable. Other: None. CT CERVICAL SPINE FINDINGS Alignment: No evidence for traumatic subluxation. Skull base and vertebrae: No acute fracture. No primary bone lesion or focal pathologic process. Soft tissues and spinal canal: No prevertebral fluid or swelling. No visible canal hematoma. Disc levels: Mild loss of disc height noted C6-7 with endplate spurring. Facet degeneration noted on the right at C2-3 and C3-4. Upper chest: Unremarkable. Other: None IMPRESSION: 1. No acute intracranial  abnormality. 2. Atrophy with chronic small vessel white matter ischemic disease. 3. No cervical spine fracture or traumatic subluxation. Electronically Signed   By: Kennith Center M.D.   On: 12/20/2022 11:37   DG Chest 2 View  Result Date: 12/20/2022 CLINICAL DATA:  Multiple falls over the past week.  Tachycardia. EXAM: CHEST - 2 VIEW COMPARISON:  Radiographs 12/08/2022 and 11/27/2022.  CT 11/04/2022. FINDINGS: 1049 hours. The heart size and mediastinal contours are stable. There is stable volume loss in the right hemithorax status post previous right middle and lower lobectomy. Stable pleural thickening on the right. The lungs are clear. There is no pleural effusion or pneumothorax. No acute osseous findings are seen. IMPRESSION: Stable chest status post right middle and lower lobectomy. No evidence of acute cardiopulmonary process. Electronically Signed   By: Carey Bullocks M.D.   On: 12/20/2022 11:12    Disposition Plan & Communication  Patient status: Observation  Admitted From: Home Planned disposition location: Home Anticipated discharge date: 11/5 pending cardiology clearance  Family Communication: daughter at bedside    Author: Leeroy Bock,  DO [facility] 12/22/2022, 7:37 AM   Available by Epic secure chat 7AM-7PM. If 7PM-7AM, please contact night-coverage.  [facility] contact information found on [email or web address].

## 2022-12-22 NOTE — Progress Notes (Signed)
Initial Nutrition Assessment  DOCUMENTATION CODES:   Non-severe (moderate) malnutrition in context of chronic illness  INTERVENTION:   -Ensure Plus High Protein po TID, each supplement provides 350 kcal and 20 grams of protein.   -Multivitamin with minerals daily  -Liberalize diet to regular to help encourage better intakes. Will leave fluid restriction up to MD.  NUTRITION DIAGNOSIS:   Moderate Malnutrition related to chronic illness as evidenced by moderate muscle depletion, mild fat depletion.  GOAL:   Patient will meet greater than or equal to 90% of their needs  MONITOR:   PO intake, Supplement acceptance, Labs, Weight trends, I & O's  REASON FOR ASSESSMENT:   Consult Assessment of nutrition requirement/status  ASSESSMENT:   77 y.o. male with medical history significant of osteoarthritis, stage IIIa chronic kidney disease, right nephrectomy, history of renal cell carcinoma, history of lung cancer, no metastasis, history of multiple episodes of pneumonia, CAD, GERD, heart murmur, hyperlipidemia, hypertension, hypothyroidism, incisional hernia, secondary adrenal insufficiency.  They were brought to the emergency department due to having a fall at home with right-sided frontal head injury while sitting on the toilet and bending forward.  Patient in room, sleeping and didn't interact with RD. Daughter at bedside and provided history. States her father has had poor appetite starting about a year ago but this worsened over the past 5 months where he is eating very little.  She states he eats a small bowl of grits for breakfast, sometimes no lunch or will drink an Ensure. For dinner this varies, could be 1/2 sandwich (pimento cheese, or ham or BBQ) or a meat and vegetables but picks at this. Pt likes Ensure and has drank 1 today so far. Will increase to TID. Feels restricted with heart healthy diet so will liberalize to regular but leave fluid restriction up to MD. Pt's daughter  states he does take a daily MVI and a gummy that contains vit D, Zinc and C.  UBW ~162-165 lbs.  Weight this admission: 151 lbs.  Medications: Pepcid, Megace, KLOR-CON  Labs reviewed: CBGs: 132  Low K  NUTRITION - FOCUSED PHYSICAL EXAM:  Flowsheet Row Most Recent Value  Orbital Region Mild depletion  Upper Arm Region Moderate depletion  Thoracic and Lumbar Region Unable to assess  Buccal Region Mild depletion  Temple Region Mild depletion  Clavicle Bone Region Moderate depletion  Clavicle and Acromion Bone Region Moderate depletion  Scapular Bone Region Moderate depletion  Dorsal Hand Mild depletion  Patellar Region Unable to assess  Anterior Thigh Region Unable to assess  Posterior Calf Region Unable to assess  Edema (RD Assessment) Moderate  [BLEs]  Hair Reviewed  Eyes Unable to assess  Mouth Reviewed  Skin Reviewed  [pale]       Diet Order:   Diet Order             Diet regular Fluid consistency: Thin; Fluid restriction: 1200 mL Fluid  Diet effective now                   EDUCATION NEEDS:   No education needs have been identified at this time  Skin:  Skin Assessment: Reviewed RN Assessment  Last BM:  11/4 -type 2  Height:   Ht Readings from Last 1 Encounters:  12/08/22 5\' 8"  (1.727 m)    Weight:   Wt Readings from Last 1 Encounters:  12/22/22 68.9 kg    BMI:  Body mass index is 23.1 kg/m.  Estimated Nutritional Needs:   Kcal:  6440-3474  Protein:  90-100g  Fluid:  1.9L/day  Tilda Franco, MS, RD, LDN Inpatient Clinical Dietitian Contact information available via Amion

## 2022-12-24 ENCOUNTER — Other Ambulatory Visit: Payer: Self-pay

## 2022-12-24 ENCOUNTER — Other Ambulatory Visit: Payer: Self-pay | Admitting: Physician Assistant

## 2022-12-24 DIAGNOSIS — Z23 Encounter for immunization: Secondary | ICD-10-CM | POA: Diagnosis not present

## 2022-12-24 DIAGNOSIS — C649 Malignant neoplasm of unspecified kidney, except renal pelvis: Secondary | ICD-10-CM

## 2022-12-24 DIAGNOSIS — F419 Anxiety disorder, unspecified: Secondary | ICD-10-CM

## 2022-12-24 DIAGNOSIS — R531 Weakness: Secondary | ICD-10-CM | POA: Diagnosis not present

## 2022-12-24 MED ORDER — DIAZEPAM 2 MG PO TABS: ORAL_TABLET | ORAL | 0 refills | Status: DC

## 2022-12-24 NOTE — Telephone Encounter (Signed)
Patient called for a refill of valium,see associated orders.

## 2022-12-25 ENCOUNTER — Encounter: Payer: Self-pay | Admitting: Physician Assistant

## 2022-12-25 LAB — CULTURE, BLOOD (ROUTINE X 2)
Culture: NO GROWTH
Special Requests: ADEQUATE

## 2022-12-25 MED FILL — Iron Sucrose Inj 20 MG/ML (Fe Equiv): INTRAVENOUS | Qty: 15 | Status: AC

## 2022-12-25 NOTE — Addendum Note (Signed)
Addended by: Domenic Schwab on: 12/25/2022 02:04 PM   Modules accepted: Orders

## 2022-12-26 ENCOUNTER — Other Ambulatory Visit: Payer: Self-pay | Admitting: Physician Assistant

## 2022-12-26 ENCOUNTER — Telehealth: Payer: Self-pay | Admitting: Internal Medicine

## 2022-12-26 ENCOUNTER — Inpatient Hospital Stay: Payer: Medicare HMO

## 2022-12-26 VITALS — BP 121/56 | HR 66 | Temp 98.1°F | Resp 17

## 2022-12-26 DIAGNOSIS — R634 Abnormal weight loss: Secondary | ICD-10-CM

## 2022-12-26 DIAGNOSIS — D509 Iron deficiency anemia, unspecified: Secondary | ICD-10-CM

## 2022-12-26 DIAGNOSIS — R531 Weakness: Secondary | ICD-10-CM

## 2022-12-26 DIAGNOSIS — D649 Anemia, unspecified: Secondary | ICD-10-CM | POA: Diagnosis not present

## 2022-12-26 DIAGNOSIS — R630 Anorexia: Secondary | ICD-10-CM

## 2022-12-26 DIAGNOSIS — D638 Anemia in other chronic diseases classified elsewhere: Secondary | ICD-10-CM

## 2022-12-26 LAB — CULTURE, BLOOD (ROUTINE X 2): Culture: NO GROWTH

## 2022-12-26 MED ORDER — SODIUM CHLORIDE 0.9 % IV SOLN
INTRAVENOUS | Status: DC
Start: 2022-12-26 — End: 2022-12-26

## 2022-12-26 MED ORDER — ACETAMINOPHEN 325 MG PO TABS
650.0000 mg | ORAL_TABLET | Freq: Once | ORAL | Status: AC
  Administered 2022-12-26: 650 mg via ORAL
  Filled 2022-12-26: qty 2

## 2022-12-26 MED ORDER — SODIUM CHLORIDE 0.9 % IV SOLN
300.0000 mg | Freq: Once | INTRAVENOUS | Status: AC
  Administered 2022-12-26: 300 mg via INTRAVENOUS
  Filled 2022-12-26: qty 300

## 2022-12-26 MED ORDER — LORATADINE 10 MG PO TABS
10.0000 mg | ORAL_TABLET | Freq: Once | ORAL | Status: AC
  Administered 2022-12-26: 10 mg via ORAL
  Filled 2022-12-26: qty 1

## 2022-12-26 NOTE — Patient Instructions (Signed)
Iron Sucrose Injection What is this medication? IRON SUCROSE (EYE ern SOO krose) treats low levels of iron (iron deficiency anemia) in people with kidney disease. Iron is a mineral that plays an important role in making red blood cells, which carry oxygen from your lungs to the rest of your body. This medicine may be used for other purposes; ask your health care provider or pharmacist if you have questions. COMMON BRAND NAME(S): Venofer What should I tell my care team before I take this medication? They need to know if you have any of these conditions: Anemia not caused by low iron levels Heart disease High levels of iron in the blood Kidney disease Liver disease An unusual or allergic reaction to iron, other medications, foods, dyes, or preservatives Pregnant or trying to get pregnant Breastfeeding How should I use this medication? This medication is for infusion into a vein. It is given in a hospital or clinic setting. Talk to your care team about the use of this medication in children. While this medication may be prescribed for children as young as 2 years for selected conditions, precautions do apply. Overdosage: If you think you have taken too much of this medicine contact a poison control center or emergency room at once. NOTE: This medicine is only for you. Do not share this medicine with others. What if I miss a dose? Keep appointments for follow-up doses. It is important not to miss your dose. Call your care team if you are unable to keep an appointment. What may interact with this medication? Do not take this medication with any of the following: Deferoxamine Dimercaprol Other iron products This medication may also interact with the following: Chloramphenicol Deferasirox This list may not describe all possible interactions. Give your health care provider a list of all the medicines, herbs, non-prescription drugs, or dietary supplements you use. Also tell them if you smoke,  drink alcohol, or use illegal drugs. Some items may interact with your medicine. What should I watch for while using this medication? Visit your care team regularly. Tell your care team if your symptoms do not start to get better or if they get worse. You may need blood work done while you are taking this medication. You may need to follow a special diet. Talk to your care team. Foods that contain iron include: whole grains/cereals, dried fruits, beans, or peas, leafy green vegetables, and organ meats (liver, kidney). What side effects may I notice from receiving this medication? Side effects that you should report to your care team as soon as possible: Allergic reactions--skin rash, itching, hives, swelling of the face, lips, tongue, or throat Low blood pressure--dizziness, feeling faint or lightheaded, blurry vision Shortness of breath Side effects that usually do not require medical attention (report to your care team if they continue or are bothersome): Flushing Headache Joint pain Muscle pain Nausea Pain, redness, or irritation at injection site This list may not describe all possible side effects. Call your doctor for medical advice about side effects. You may report side effects to FDA at 1-800-FDA-1088. Where should I keep my medication? This medication is given in a hospital or clinic. It will not be stored at home. NOTE: This sheet is a summary. It may not cover all possible information. If you have questions about this medicine, talk to your doctor, pharmacist, or health care provider.  2024 Elsevier/Gold Standard (2022-07-10 00:00:00)

## 2022-12-26 NOTE — Telephone Encounter (Signed)
Called patient's daughter regarding upcoming appointments, patient is notified.

## 2022-12-28 ENCOUNTER — Encounter: Payer: Self-pay | Admitting: Internal Medicine

## 2022-12-28 ENCOUNTER — Other Ambulatory Visit: Payer: Self-pay

## 2022-12-28 ENCOUNTER — Other Ambulatory Visit: Payer: Medicare HMO

## 2022-12-28 ENCOUNTER — Ambulatory Visit: Payer: Medicare HMO | Admitting: Internal Medicine

## 2022-12-28 ENCOUNTER — Ambulatory Visit: Payer: Medicare HMO

## 2022-12-28 VITALS — BP 134/67 | HR 92 | Temp 97.5°F

## 2022-12-28 DIAGNOSIS — J329 Chronic sinusitis, unspecified: Secondary | ICD-10-CM | POA: Diagnosis not present

## 2022-12-28 DIAGNOSIS — B49 Unspecified mycosis: Secondary | ICD-10-CM

## 2022-12-28 NOTE — Progress Notes (Unsigned)
Patient: [name]  DOB: May 14, 1945 MRN: [id number] PCP: [name], MD  Referring Provider: ***  Chief Complaint  Patient presents with   Follow-up    Invasive fungal sinusitis     Patient Active Problem List   Diagnosis Date Noted   Malnutrition of moderate degree 12/22/2022   Head injury 12/21/2022   Syncope 12/20/2022   Thrombocytosis 12/20/2022   Frequent falls 12/20/2022   Hypomagnesemia 12/20/2022   Grade I diastolic dysfunction 12/20/2022   Hypokalemia 12/09/2022   Iron deficiency anemia 12/08/2022   SIRS (systemic inflammatory response syndrome) (HCC) 12/08/2022   Paroxysmal atrial fibrillation (HCC) 12/08/2022   Anxiety 06/26/2022   Adrenal disease (HCC) 06/25/2022   Medication management 06/25/2022   Anemia of chronic disease 06/24/2022   Hyponatremia 06/24/2022   Palliative care encounter 06/24/2022   Goals of care, counseling/discussion 06/24/2022   Counseling and coordination of care 06/24/2022   Need for emotional support 06/24/2022   Poor appetite 06/24/2022   Invasive fungal sinusitis 06/10/2022   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) 06/10/2022   Maxillary sinusitis 05/22/2022   Jaw pain 03/31/2022   Adrenal insufficiency (HCC) 08/23/2018   Atherosclerosis of aorta (HCC) 08/23/2018   Acquired hypothyroidism 04/19/2018   Secondary adrenal insufficiency (HCC) 04/19/2018   Acute hyponatremia 03/24/2018   Essential hypertension    Protein-calorie malnutrition, severe 03/14/2018   Generalized weakness 03/13/2018   Hyperlipidemia 11/30/2017   Kidney cancer, primary, with metastasis from kidney to other site [facility]) 09/14/2017   Recurrent umbilical hernia 08/01/2015   Incisional hernia 08/01/2015   Hypertensive heart disease without CHF    GERD (gastroesophageal reflux disease)    Chronic kidney disease    Coronary artery disease    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 02/01/2015     Subjective:   [name] is a 77 y.o. @GENDER @ with  Today 12/28/22: He states he is feeling bloated, thinks lasix is helping. Energy is not where he would like it. Denies fever X 2 weeks ROS  Past Medical History:  Diagnosis Date   Arthritis    Chronic kidney disease    only has one kidney    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 02/01/2015   Coronary artery disease    followed by Dr.Tilley   Frequent PVCs    GERD (gastroesophageal reflux disease)    Heart murmur    never has caused any problems   Hx of cancer of lung 1980's   Hyperlipidemia    Hypertension    Hypothyroidism    Incisional hernia 08/01/2015   Lung cancer (HCC) 1993   Lung metastases 2019   Pneumonia    x several   Recurrent umbilical hernia 08/01/2015    Outpatient Medications Prior to Visit  Medication Sig Dispense Refill   furosemide (LASIX) 20 MG tablet Take 20 mg by mouth daily.     acetaminophen (TYLENOL) 500 MG tablet Take 1,000 mg by mouth as needed for mild pain (pain score 1-3) or moderate pain (pain score 4-6).     diazepam (VALIUM) 2 MG tablet TAKE 1-2 TABLETS BY MOUTH EVERY 8 HOURS AS NEEDED FOR ANXIETY Strength: 2 mg 30 tablet 0   diltiazem (CARDIZEM CD) 240 MG 24 hr capsule Take 1 capsule (240 mg total) by mouth in the morning. 90 capsule 3   famotidine (PEPCID) 20 MG tablet Take 20 mg by mouth daily.     fluticasone (FLONASE) 50 MCG/ACT nasal  spray Place 1 spray into both nostrils as needed for allergies.     hydrocortisone (CORTEF) 20 MG tablet Take 20 mg by mouth in the morning.     hydrocortisone (CORTEF) 5 MG tablet Take 1 tablet (5 mg total) by mouth See admin instructions. Daily at 3pm.     levothyroxine (SYNTHROID) 150 MCG tablet Take 1 tablet (150 mcg total) by mouth daily before breakfast. 90 tablet 3   loratadine (CLARITIN) 10 MG tablet Take 10 mg by mouth daily.     megestrol (MEGACE) 20 MG tablet TAKE 1 TABLET BY MOUTH DAILY 30 tablet 1   metoprolol succinate  (TOPROL-XL) 100 MG 24 hr tablet Take 1 tablet (100 mg total) by mouth daily. Take with or immediately following a meal. 30 tablet 0   Multiple Vitamins-Minerals (CENTRUM SILVER 50+MEN) TABS Take 1 tablet by mouth at bedtime.     mupirocin ointment (BACTROBAN) 2 % Place 1 Application into the nose daily as needed (sores).     posaconazole (NOXAFIL) 100 MG TBEC delayed-release tablet Take 3 tablets (300 mg total) by mouth daily. 42 tablet 2   VITAMIN D PO Take 1 capsule by mouth at bedtime.     No facility-administered medications prior to visit.     Allergies  Allergen Reactions   Diphenhydramine Other (See Comments)    Severe restless legs    Social History   Tobacco Use   Smoking status: Former    Current packs/day: 0.00    Average packs/day: 1 pack/day for 20.0 years (20.0 ttl pk-yrs)    Types: Cigarettes    Start date: 01/29/1968    Quit date: 01/29/1988    Years since quitting: 34.9   Smokeless tobacco: Never  Vaping Use   Vaping status: Never Used  Substance Use Topics   Alcohol use: Not Currently    Comment: rare   Drug use: No    Family History  Problem Relation Age of Onset   Heart attack Father     Objective:  There were no vitals filed for this visit. There is no height or weight on file to calculate BMI.  Physical Exam  Lab Results: Lab Results  Component Value Date   WBC 13.6 (H) 12/22/2022   HGB 8.1 (L) 12/22/2022   HCT 28.7 (L) 12/22/2022   MCV 85.2 12/22/2022   PLT 387 12/22/2022    Lab Results  Component Value Date   CREATININE 0.84 12/22/2022   BUN 14 12/22/2022   NA 140 12/22/2022   K 3.4 (L) 12/22/2022   CL 93 (L) 12/22/2022   CO2 34 (H) 12/22/2022    Lab Results  Component Value Date   ALT 11 12/21/2022   AST 10 (L) 12/21/2022   ALKPHOS 113 12/21/2022   BILITOT 0.6 12/21/2022     Assessment & Plan:   -Stop Posaconazole as CT is stable and pt completed 6 months of therapy.  -F/U in 3 months or sooner if symptoms  return -Relayed stopping posa to med onc   Danelle Earthly, MD [facility]   12/28/22  9:53 AM

## 2022-12-29 ENCOUNTER — Inpatient Hospital Stay: Payer: Medicare HMO | Admitting: Internal Medicine

## 2022-12-29 ENCOUNTER — Telehealth: Payer: Self-pay | Admitting: Medical Oncology

## 2022-12-29 ENCOUNTER — Inpatient Hospital Stay (HOSPITAL_BASED_OUTPATIENT_CLINIC_OR_DEPARTMENT_OTHER): Payer: Medicare HMO

## 2022-12-29 ENCOUNTER — Encounter: Payer: Self-pay | Admitting: Physician Assistant

## 2022-12-29 ENCOUNTER — Telehealth: Payer: Self-pay

## 2022-12-29 ENCOUNTER — Other Ambulatory Visit: Payer: Self-pay | Admitting: Physician Assistant

## 2022-12-29 VITALS — BP 118/61 | HR 77 | Temp 98.8°F | Resp 15 | Ht 68.0 in | Wt 155.0 lb

## 2022-12-29 DIAGNOSIS — C649 Malignant neoplasm of unspecified kidney, except renal pelvis: Secondary | ICD-10-CM

## 2022-12-29 DIAGNOSIS — D649 Anemia, unspecified: Secondary | ICD-10-CM | POA: Diagnosis not present

## 2022-12-29 DIAGNOSIS — C641 Malignant neoplasm of right kidney, except renal pelvis: Secondary | ICD-10-CM

## 2022-12-29 DIAGNOSIS — E876 Hypokalemia: Secondary | ICD-10-CM

## 2022-12-29 LAB — CBC WITH DIFFERENTIAL/PLATELET
Abs Immature Granulocytes: 0.13 10*3/uL — ABNORMAL HIGH (ref 0.00–0.07)
Basophils Absolute: 0 10*3/uL (ref 0.0–0.1)
Basophils Relative: 0 %
Eosinophils Absolute: 0 10*3/uL (ref 0.0–0.5)
Eosinophils Relative: 0 %
HCT: 29.2 % — ABNORMAL LOW (ref 39.0–52.0)
Hemoglobin: 8.4 g/dL — ABNORMAL LOW (ref 13.0–17.0)
Immature Granulocytes: 1 %
Lymphocytes Relative: 5 %
Lymphs Abs: 0.7 10*3/uL (ref 0.7–4.0)
MCH: 23.7 pg — ABNORMAL LOW (ref 26.0–34.0)
MCHC: 28.8 g/dL — ABNORMAL LOW (ref 30.0–36.0)
MCV: 82.5 fL (ref 80.0–100.0)
Monocytes Absolute: 0.8 10*3/uL (ref 0.1–1.0)
Monocytes Relative: 5 %
Neutro Abs: 13 10*3/uL — ABNORMAL HIGH (ref 1.7–7.7)
Neutrophils Relative %: 89 %
Platelets: 375 10*3/uL (ref 150–400)
RBC: 3.54 MIL/uL — ABNORMAL LOW (ref 4.22–5.81)
RDW: 19.5 % — ABNORMAL HIGH (ref 11.5–15.5)
WBC: 14.6 10*3/uL — ABNORMAL HIGH (ref 4.0–10.5)
nRBC: 0 % (ref 0.0–0.2)

## 2022-12-29 LAB — COMPREHENSIVE METABOLIC PANEL
ALT: 7 U/L (ref 0–44)
AST: 8 U/L — ABNORMAL LOW (ref 15–41)
Albumin: 2.7 g/dL — ABNORMAL LOW (ref 3.5–5.0)
Alkaline Phosphatase: 157 U/L — ABNORMAL HIGH (ref 38–126)
Anion gap: 8 (ref 5–15)
BUN: 10 mg/dL (ref 8–23)
CO2: 37 mmol/L — ABNORMAL HIGH (ref 22–32)
Calcium: 8 mg/dL — ABNORMAL LOW (ref 8.9–10.3)
Chloride: 94 mmol/L — ABNORMAL LOW (ref 98–111)
Creatinine, Ser: 0.85 mg/dL (ref 0.61–1.24)
GFR, Estimated: >60 mL/min (ref >60)
Glucose, Bld: 274 mg/dL — ABNORMAL HIGH (ref 70–99)
Potassium: 2.3 mmol/L — CL (ref 3.5–5.1)
Sodium: 139 mmol/L (ref 135–145)
Total Bilirubin: 0.6 mg/dL (ref <1.2)
Total Protein: 5.9 g/dL — ABNORMAL LOW (ref 6.5–8.1)

## 2022-12-29 MED ORDER — POTASSIUM CHLORIDE 20 MEQ/15ML (10%) PO SOLN: 20.0000 meq | Freq: Two times a day (BID) | ORAL | 0 refills | Status: DC

## 2022-12-29 NOTE — Telephone Encounter (Signed)
[name] aware to pick up potassium supplement for [name].

## 2022-12-29 NOTE — Progress Notes (Signed)
[facility] [phone]   [phone]  OFFICE PROGRESS NOTE  [name], MD [address] [place] [address]  DIAGNOSIS: Stage IV clear-cell renal cell carcinoma diagnosed initially in 2016 as localized T1 a disease with evidence of metastatic disease to the liver and omentum in 2019.  PRIOR THERAPY: 1) status post radical right renal nephrectomy under the care of Dr. Marlou Porch on February 01, 2015 and the final pathology revealed 3.3 cm clear renal cell carcinoma with Fuhrman grade 3. 2) status post omental biopsy in July 2019 that confirmed the presence of recurrent metastatic renal cell carcinoma. 3) status post treatment with immunotherapy with ipilimumab 1 Mg/KG and nivolumab 3 mg/KG every 3 weeks started 09/21/2017 status post 4 cycles and the patient did not receive any additional immunotherapy secondary to adrenal insufficiency and panhypopituitarism 4) status post radiotherapy to the abdominal lymph node completed November 08, 2019. 5) Cabometyx initially started at 40 mg p.o. daily in March 2023 and reduced to 20 mg p.o. daily in April 2023.  This was discontinued in June 2024 secondary to disease progression. 6) Axitinib 2 mg p.o. twice daily.  First dose started 07/29/2022.  This was discontinued on November 12, 2022 secondary to disease progression.   CURRENT THERAPY: 120 mg p.o. daily of belzutifan, starting in the next few days.   INTERVAL HISTORY: [name] 77 y.o. male returns to the clinic today for follow-up visit accompanied by his daughter [name]. Discussed the use of AI scribe software for clinical note transcription with the patient, who gave verbal consent to proceed.  History of Present Illness   The patient, a 77 year old with a history of renal cell carcinoma diagnosed in 2016 and progressed in 2019, has been on several treatments. Recently, the patient's condition has significantly deteriorated, with  increased weakness and multiple falls. The patient's appetite and fluid intake have decreased, with only a small amount of food and an Ensure supplement consumed today.  The patient's quality of life has been significantly impacted, with more time spent in the hospital than at home.  The patient also has anemia, with a hemoglobin level of 8.4, and is scheduled for an iron infusion. The patient's daughter reports that the patient seems to be experiencing increased shortness of breath, likely due to the anemia.  The patient has lost weight and is not experiencing any nausea, vomiting, or diarrhea. The patient has been prescribed a steroid for his thyroid and is considering starting a steroid dose pack to potentially boost his energy levels.  The patient and his family have not yet discussed future care plans, including the possibility of palliative care or hospice. The patient has expressed a desire to try the new medication, Welireg.         MEDICAL HISTORY: Past Medical History:  Diagnosis Date   Arthritis    Chronic kidney disease    only has one kidney    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 02/01/2015   Coronary artery disease    followed by Dr.Tilley   Frequent PVCs    GERD (gastroesophageal reflux disease)    Heart murmur    never has caused any problems   Hx of cancer of lung 1980's   Hyperlipidemia    Hypertension    Hypothyroidism    Incisional hernia 08/01/2015   Lung cancer (HCC) 1993   Lung metastases 2019   Pneumonia    x several  Recurrent umbilical hernia 08/01/2015    ALLERGIES:  is allergic to diphenhydramine.  MEDICATIONS:  Current Outpatient Medications  Medication Sig Dispense Refill   acetaminophen (TYLENOL) 500 MG tablet Take 1,000 mg by mouth as needed for mild pain (pain score 1-3) or moderate pain (pain score 4-6).     diazepam (VALIUM) 2 MG tablet TAKE 1-2 TABLETS BY MOUTH EVERY 8 HOURS AS NEEDED FOR ANXIETY Strength:  2 mg 30 tablet 0   diltiazem (CARDIZEM CD) 240 MG 24 hr capsule Take 1 capsule (240 mg total) by mouth in the morning. 90 capsule 3   famotidine (PEPCID) 20 MG tablet Take 20 mg by mouth daily.     fluticasone (FLONASE) 50 MCG/ACT nasal spray Place 1 spray into both nostrils as needed for allergies.     furosemide (LASIX) 20 MG tablet Take 20 mg by mouth daily.     hydrocortisone (CORTEF) 20 MG tablet Take 20 mg by mouth in the morning.     hydrocortisone (CORTEF) 5 MG tablet Take 1 tablet (5 mg total) by mouth See admin instructions. Daily at 3pm.     levothyroxine (SYNTHROID) 150 MCG tablet Take 1 tablet (150 mcg total) by mouth daily before breakfast. 90 tablet 3   loratadine (CLARITIN) 10 MG tablet Take 10 mg by mouth daily.     megestrol (MEGACE) 20 MG tablet TAKE 1 TABLET BY MOUTH DAILY 30 tablet 1   metoprolol succinate (TOPROL-XL) 100 MG 24 hr tablet Take 1 tablet (100 mg total) by mouth daily. Take with or immediately following a meal. 30 tablet 0   Multiple Vitamins-Minerals (CENTRUM SILVER 50+MEN) TABS Take 1 tablet by mouth at bedtime.     mupirocin ointment (BACTROBAN) 2 % Place 1 Application into the nose daily as needed (sores).     posaconazole (NOXAFIL) 100 MG TBEC delayed-release tablet Take 3 tablets (300 mg total) by mouth daily. 42 tablet 2   VITAMIN D PO Take 1 capsule by mouth at bedtime.     No current facility-administered medications for this visit.    SURGICAL HISTORY:  Past Surgical History:  Procedure Laterality Date   APPENDECTOMY  age 64   ARTERY BIOPSY Left 04/10/2022   Procedure: LEFT TEMPORAL ARTERY BIOPSY;  Surgeon: Chuck Hint, MD;  Location: [facility] OR;  Service: Vascular;  Laterality: Left;   CHOLECYSTECTOMY  10/18/1978   EYE SURGERY Bilateral 2019   cataract   LAPAROSCOPIC LYSIS OF ADHESIONS N/A 08/01/2015   Procedure: LAPAROSCOPIC LYSIS OF ADHESIONS;  Surgeon: Karie Soda, MD;  Location: [facility] ORS;  Service: General;  Laterality: N/A;   LUNG  LOBECTOMY  02/16/1990   lung cancer- patient has staples in lung not to have MRI per patient    MAXILLARY ANTROSTOMY Left 05/22/2022   Procedure: MAXILLARY ANTROSTOMY;  Surgeon: Laren Boom, DO;  Location: [facility] OR;  Service: ENT;  Laterality: Left;   PILONIDAL CYST EXCISION  02/16/2009   Dr Gerrit Friends   ROBOTIC ASSITED PARTIAL NEPHRECTOMY Right 02/01/2015   Procedure: RIGHT ROBOTIC ASSISTED LAPAROSOCOPY NEPHRECTOMY;  Surgeon: Crist Fat, MD;  Location: [facility] ORS;  Service: Urology;  Laterality: Right;   SINUS ENDO WITH FUSION Left 05/22/2022   Procedure: SINUS ENDO WITH FUSION;  Surgeon: Laren Boom, DO;  Location: [facility] OR;  Service: ENT;  Laterality: Left;   VENTRAL HERNIA REPAIR N/A 08/01/2015   Procedure: LAPAROSCOPIC VENTRAL WALL HERNIA WITH MESH;  Surgeon: Karie Soda, MD;  Location: [facility] ORS;  Service: General;  Laterality: N/A;  REVIEW OF SYSTEMS:  Constitutional: positive for anorexia, fatigue, and weight loss Eyes: negative Ears, nose, mouth, throat, and face: negative Respiratory: positive for dyspnea on exertion Cardiovascular: negative Gastrointestinal: negative Genitourinary:negative Integument/breast: negative Hematologic/lymphatic: negative Musculoskeletal:positive for muscle weakness Neurological: negative Behavioral/Psych: negative Endocrine: negative Allergic/Immunologic: negative   PHYSICAL EXAMINATION: General appearance: alert, cooperative, fatigued, and no distress Head: Normocephalic, without obvious abnormality, atraumatic Neck: no adenopathy, no JVD, supple, symmetrical, trachea midline, and thyroid not enlarged, symmetric, no tenderness/mass/nodules Lymph nodes: Cervical, supraclavicular, and axillary nodes normal. Resp: clear to auscultation bilaterally Back: symmetric, no curvature. ROM normal. No CVA tenderness. Cardio: regular rate and rhythm, S1, S2 normal, no murmur, click, rub or gallop GI: soft, non-tender; bowel sounds normal; no  masses,  no organomegaly Extremities: extremities normal, atraumatic, no cyanosis or edema Neurologic: Alert and oriented X 3, normal strength and tone. Normal symmetric reflexes. Normal coordination and gait  ECOG PERFORMANCE STATUS: 1 - Symptomatic but completely ambulatory  Blood pressure 118/61, pulse 77, temperature 98.8 F (37.1 C), temperature source Temporal, resp. rate 15, height 5\' 8"  (1.727 m), weight 155 lb (70.3 kg), SpO2 94%.  LABORATORY DATA: Lab Results  Component Value Date   WBC 14.6 (H) 12/29/2022   HGB 8.4 (L) 12/29/2022   HCT 29.2 (L) 12/29/2022   MCV 82.5 12/29/2022   PLT 375 12/29/2022      Chemistry      Component Value Date/Time   NA 140 12/22/2022 0512   NA 141 08/11/2021 1252   K 3.4 (L) 12/22/2022 0512   CL 93 (L) 12/22/2022 0512   CO2 34 (H) 12/22/2022 0512   BUN 14 12/22/2022 0512   BUN 14 08/11/2021 1252   CREATININE 0.84 12/22/2022 0512   CREATININE 0.72 12/18/2022 0901   CREATININE 0.97 10/12/2022 1022      Component Value Date/Time   CALCIUM 8.1 (L) 12/22/2022 0512   ALKPHOS 113 12/21/2022 0444   AST 10 (L) 12/21/2022 0444   AST 8 (L) 12/18/2022 0901   ALT 11 12/21/2022 0444   ALT 8 12/18/2022 0901   BILITOT 0.6 12/21/2022 0444   BILITOT 0.5 12/18/2022 0901       RADIOGRAPHIC STUDIES: ECHOCARDIOGRAM COMPLETE  Result Date: 12/21/2022    ECHOCARDIOGRAM REPORT   Patient Name:   [name] Date of Exam: 12/21/2022 Medical Rec #:  [id number]  Height:       68.0 in Accession #:    [id number] Weight:       156.3 lb Date of Birth:  [birth year]  BSA:          1.840 m Patient Age:    77 years   BP:           142/85 mmHg Patient Gender: M          HR:           106 bpm. Exam Location:  Inpatient Procedure: 2D Echo, Cardiac Doppler, Color Doppler and Intracardiac            Opacification Agent Indications:    Syncope R55  History:        Patient has prior history of Echocardiogram examinations, most                 recent 03/02/2022. CHF, CAD,  Arrythmias:Atrial Fibrillation,                 Signs/Symptoms:Syncope; Risk Factors:Hypertension and  Dyslipidemia. CKD.  Sonographer:    Lucendia Herrlich RCS Referring Phys: [phone] DAVID MANUEL ORTIZ  Sonographer Comments: Image acquisition challenging due to patient body habitus. IMPRESSIONS  1. Left ventricular ejection fraction, by estimation, is 55 to 60%. The left ventricle has normal function. The left ventricle has no regional wall motion abnormalities. Left ventricular diastolic parameters are consistent with Grade I diastolic dysfunction (impaired relaxation).  2. Right ventricular systolic function is normal. The right ventricular size is normal.  3. The mitral valve is normal in structure. Trivial mitral valve regurgitation. No evidence of mitral stenosis.  4. The aortic valve is tricuspid. There is mild calcification of the aortic valve. Aortic valve regurgitation is not visualized. Aortic valve sclerosis/calcification is present, without any evidence of aortic stenosis.  5. The inferior vena cava is normal in size with greater than 50% respiratory variability, suggesting right atrial pressure of 3 mmHg. FINDINGS  Left Ventricle: Left ventricular ejection fraction, by estimation, is 55 to 60%. The left ventricle has normal function. The left ventricle has no regional wall motion abnormalities. Definity contrast agent was given IV to delineate the left ventricular  endocardial borders. The left ventricular internal cavity size was normal in size. There is no left ventricular hypertrophy. Left ventricular diastolic parameters are consistent with Grade I diastolic dysfunction (impaired relaxation). Right Ventricle: The right ventricular size is normal. No increase in right ventricular wall thickness. Right ventricular systolic function is normal. Left Atrium: Left atrial size was normal in size. Right Atrium: Right atrial size was normal in size. Pericardium: There is no evidence of  pericardial effusion. Mitral Valve: The mitral valve is normal in structure. Mild mitral annular calcification. Trivial mitral valve regurgitation. No evidence of mitral valve stenosis. Tricuspid Valve: The tricuspid valve is normal in structure. Tricuspid valve regurgitation is not demonstrated. No evidence of tricuspid stenosis. Aortic Valve: The aortic valve is tricuspid. There is mild calcification of the aortic valve. Aortic valve regurgitation is not visualized. Aortic valve sclerosis/calcification is present, without any evidence of aortic stenosis. Aortic valve peak gradient measures 5.0 mmHg. Pulmonic Valve: The pulmonic valve was not well visualized. Pulmonic valve regurgitation is not visualized. No evidence of pulmonic stenosis. Aorta: The ascending aorta was not well visualized and the aortic root was not well visualized. Venous: The inferior vena cava is normal in size with greater than 50% respiratory variability, suggesting right atrial pressure of 3 mmHg. IAS/Shunts: No atrial level shunt detected by color flow Doppler.  LEFT VENTRICLE PLAX 2D LVIDd:         4.30 cm   Diastology LVIDs:         3.70 cm   LV e' medial:    6.20 cm/s LV PW:         0.70 cm   LV E/e' medial:  14.6 LV IVS:        0.90 cm   LV e' lateral:   8.27 cm/s LVOT diam:     2.10 cm   LV E/e' lateral: 11.0 LV SV:         41 LV SV Index:   22 LVOT Area:     3.46 cm  RIGHT VENTRICLE             IVC RV S prime:     12.10 cm/s  IVC diam: 1.90 cm TAPSE (M-mode): 1.3 cm LEFT ATRIUM             Index        RIGHT  ATRIUM          Index LA diam:        2.60 cm 1.41 cm/m   RA Area:     7.10 cm LA Vol (A2C):   37.8 ml 20.54 ml/m  RA Volume:   8.04 ml  4.37 ml/m LA Vol (A4C):   25.8 ml 14.02 ml/m LA Biplane Vol: 34.0 ml 18.47 ml/m  AORTIC VALVE AV Area (Vmax): 2.20 cm AV Vmax:        112.00 cm/s AV Peak Grad:   5.0 mmHg LVOT Vmax:      71.10 cm/s LVOT Vmean:     46.000 cm/s LVOT VTI:       0.119 m MITRAL VALVE                TRICUSPID  VALVE MV Area (PHT): 6.27 cm     TR Peak grad:   10.9 mmHg MV Decel Time: 121 msec     TR Vmax:        165.00 cm/s MR Peak grad: 19.0 mmHg MR Vmax:      218.00 cm/s   SHUNTS MV E velocity: 90.60 cm/s   Systemic VTI:  0.12 m MV A velocity: 113.00 cm/s  Systemic Diam: 2.10 cm MV E/A ratio:  0.80 Arvilla Meres MD Electronically signed by Arvilla Meres MD Signature Date/Time: 12/21/2022/10:31:26 AM    Final    DG Pelvis Portable  Result Date: 12/20/2022 CLINICAL DATA:  Multiple falls EXAM: PORTABLE PELVIS 1-2 VIEWS COMPARISON:  11/27/2022 FINDINGS: There is no evidence of pelvic fracture or diastasis. No fracture or dislocation of the hips. No pelvic bone lesions are seen. IMPRESSION: Negative. Electronically Signed   By: Duanne Guess D.O.   On: 12/20/2022 18:53   CT Head Wo Contrast  Result Date: 12/20/2022 CLINICAL DATA:  Multiple falls including last night. Hit back of head. Posterior bruising and redness. EXAM: CT HEAD WITHOUT CONTRAST CT CERVICAL SPINE WITHOUT CONTRAST TECHNIQUE: Multidetector CT imaging of the head and cervical spine was performed following the standard protocol without intravenous contrast. Multiplanar CT image reconstructions of the cervical spine were also generated. RADIATION DOSE REDUCTION: This exam was performed according to the departmental dose-optimization program which includes automated exposure control, adjustment of the mA and/or kV according to patient size and/or use of iterative reconstruction technique. COMPARISON:  11/27/2022 FINDINGS: CT HEAD FINDINGS Brain: There is no evidence for acute hemorrhage, hydrocephalus, mass lesion, or abnormal extra-axial fluid collection. No definite CT evidence for acute infarction. Diffuse loss of parenchymal volume is consistent with atrophy. Patchy low attenuation in the deep hemispheric and periventricular white matter is nonspecific, but likely reflects chronic microvascular ischemic demyelination. Vascular: No hyperdense  vessel or unexpected calcification. Skull: No evidence for fracture. No worrisome lytic or sclerotic lesion. Sinuses/Orbits: The visualized paranasal sinuses and mastoid air cells are clear. Visualized portions of the globes and intraorbital fat are unremarkable. Other: None. CT CERVICAL SPINE FINDINGS Alignment: No evidence for traumatic subluxation. Skull base and vertebrae: No acute fracture. No primary bone lesion or focal pathologic process. Soft tissues and spinal canal: No prevertebral fluid or swelling. No visible canal hematoma. Disc levels: Mild loss of disc height noted C6-7 with endplate spurring. Facet degeneration noted on the right at C2-3 and C3-4. Upper chest: Unremarkable. Other: None IMPRESSION: 1. No acute intracranial abnormality. 2. Atrophy with chronic small vessel white matter ischemic disease. 3. No cervical spine fracture or traumatic subluxation. Electronically Signed   By: Kennith Center  M.D.   On: 12/20/2022 11:37   CT Cervical Spine Wo Contrast  Result Date: 12/20/2022 CLINICAL DATA:  Multiple falls including last night. Hit back of head. Posterior bruising and redness. EXAM: CT HEAD WITHOUT CONTRAST CT CERVICAL SPINE WITHOUT CONTRAST TECHNIQUE: Multidetector CT imaging of the head and cervical spine was performed following the standard protocol without intravenous contrast. Multiplanar CT image reconstructions of the cervical spine were also generated. RADIATION DOSE REDUCTION: This exam was performed according to the departmental dose-optimization program which includes automated exposure control, adjustment of the mA and/or kV according to patient size and/or use of iterative reconstruction technique. COMPARISON:  11/27/2022 FINDINGS: CT HEAD FINDINGS Brain: There is no evidence for acute hemorrhage, hydrocephalus, mass lesion, or abnormal extra-axial fluid collection. No definite CT evidence for acute infarction. Diffuse loss of parenchymal volume is consistent with atrophy.  Patchy low attenuation in the deep hemispheric and periventricular white matter is nonspecific, but likely reflects chronic microvascular ischemic demyelination. Vascular: No hyperdense vessel or unexpected calcification. Skull: No evidence for fracture. No worrisome lytic or sclerotic lesion. Sinuses/Orbits: The visualized paranasal sinuses and mastoid air cells are clear. Visualized portions of the globes and intraorbital fat are unremarkable. Other: None. CT CERVICAL SPINE FINDINGS Alignment: No evidence for traumatic subluxation. Skull base and vertebrae: No acute fracture. No primary bone lesion or focal pathologic process. Soft tissues and spinal canal: No prevertebral fluid or swelling. No visible canal hematoma. Disc levels: Mild loss of disc height noted C6-7 with endplate spurring. Facet degeneration noted on the right at C2-3 and C3-4. Upper chest: Unremarkable. Other: None IMPRESSION: 1. No acute intracranial abnormality. 2. Atrophy with chronic small vessel white matter ischemic disease. 3. No cervical spine fracture or traumatic subluxation. Electronically Signed   By: Kennith Center M.D.   On: 12/20/2022 11:37   DG Chest 2 View  Result Date: 12/20/2022 CLINICAL DATA:  Multiple falls over the past week.  Tachycardia. EXAM: CHEST - 2 VIEW COMPARISON:  Radiographs 12/08/2022 and 11/27/2022.  CT 11/04/2022. FINDINGS: 1049 hours. The heart size and mediastinal contours are stable. There is stable volume loss in the right hemithorax status post previous right middle and lower lobectomy. Stable pleural thickening on the right. The lungs are clear. There is no pleural effusion or pneumothorax. No acute osseous findings are seen. IMPRESSION: Stable chest status post right middle and lower lobectomy. No evidence of acute cardiopulmonary process. Electronically Signed   By: Carey Bullocks M.D.   On: 12/20/2022 11:12   CT MAXILLOFACIAL W CONTRAST  Result Date: 12/09/2022 CLINICAL DATA:  Follow-up fungal  sinusitis. EXAM: CT MAXILLOFACIAL WITH CONTRAST TECHNIQUE: Multidetector CT imaging of the maxillofacial structures was performed with intravenous contrast. Multiplanar CT image reconstructions were also generated. RADIATION DOSE REDUCTION: This exam was performed according to the departmental dose-optimization program which includes automated exposure control, adjustment of the mA and/or kV according to patient size and/or use of iterative reconstruction technique. CONTRAST:  75mL OMNIPAQUE IOHEXOL 300 MG/ML  SOLN COMPARISON:  Maxillofacial CT 09/03/2022 FINDINGS: Osseous: No acute fracture or new bone lesion. Orbits: Bilateral cataract extraction. Mild extraconal soft tissue thickening inferolaterally in the left orbit along the area bone destruction, decreased from prior. Sinuses: Unchanged bone destruction involving the posterior and inferior walls of the left maxillary sinus as well as mild involvement of the left pterygoid process. Circumferential soft tissue in the left maxillary sinus on the prior CT has greatly improved, with residual nodular soft tissue present in the inferior and posterior  aspects of the sinus currently. Abnormal soft tissue extending from the sinus into the masticator space, infratemporal fossa, and pterygoid palatine fossa has overall mildly improved. A left maxillary antrostomy is widely patent. There is minimal scattered mucosal thickening in the other paranasal sinuses, and there is rightward nasal septal deviation. Soft tissues: As above. Limited intracranial: No acute finding. IMPRESSION: Greatly decreased soft tissue in the left maxillary sinus. Unchanged associated bone destruction and mildly improved soft tissue infiltrating the regional deep spaces as above. No new or progressive finding. Electronically Signed   By: Sebastian Ache M.D.   On: 12/09/2022 17:05   DG Chest Port 1 View  Result Date: 12/08/2022 CLINICAL DATA:  Shortness of breath EXAM: PORTABLE CHEST 1 VIEW  COMPARISON:  Chest x-ray 11/27/2022 FINDINGS: There is stable elevation of the right hemidiaphragm with blunting of the right costophrenic angle, likely scarring. Cardiomediastinal silhouette is stable. Surgical clips overlie the right hilum. Left lung is clear. No pneumothorax. No acute fractures are seen. IMPRESSION: No acute findings. Electronically Signed   By: Darliss Cheney M.D.   On: 12/08/2022 15:21    ASSESSMENT AND PLAN: This is a very pleasant 77 years old white male with Stage IV clear-cell renal cell carcinoma diagnosed initially in 2016 as localized T1 a disease with evidence of metastatic disease to the liver and omentum in 2019. The patient underwent the following treatment: 1) status post radical right renal nephrectomy under the care of Dr. Marlou Porch on February 01, 2015 and the final pathology revealed 3.3 cm clear renal cell carcinoma with Fuhrman grade 3. 2) status post omental biopsy in July 2019 that confirmed the presence of recurrent metastatic renal cell carcinoma. 3) status post treatment with immunotherapy with ipilimumab 1 Mg/KG and nivolumab 3 mg/KG every 3 weeks started 09/21/2017 status post 4 cycles and the patient did not receive any additional immunotherapy secondary to adrenal insufficiency and panhypopituitarism 4) status post radiotherapy to the abdominal lymph node completed November 08, 2019. He was on treatment with Cabometyx 20 mg p.o. daily started March 2023.  His treatment has been on hold for the last 2 months because of the recent sinus issues and his final diagnosis was fungal sinusitis and currently on treatment with voriconazole by infectious disease.  Unfortunately his scan showed evidence for disease progression in the abdomen including the pancreatic mass as well as peripancreatic and retroperitoneal lymphadenopathy as well as liver lesions. I recommended for the patient to discontinue his current treatment with Cabometyx at this point. We considered the  patient for treatment with axitinib initially at 5 mg p.o. twice daily but because of drug interaction with posaconazole, we will reduce his dose of treatment to 2 mg p.o. twice daily for now.  He has been tolerating this treatment fairly well. He had repeat CT scan of the chest, abdomen and pelvis performed on 11/04/2022 and that showed interval progression of the hepatic metastatic disease with new low-attenuation lesion within the liver when compared to the previous scan of May 03, 2022. The large mass in the region of the uncinate process of the pancreas measures 7.5 x 7.8 cm and larger than the previous scan.  There was also increase in size of upper abdominal lymph nodes compatible with nodal metastasis and increase in the caliber of the proximal common bile duct which measured 1.4 cm.  There was no signs of pulmonary metastasis. I recommended for the patient to discontinue his treatment with axitinib at this point. He was seen by  Dr. Clarene Duke at [facility] who recommended for him treatment with belzutifan.  The patient has the medication at home but he has not started it because of the frequent hospitalization. Assessment and Plan    Renal Cell Carcinoma Renal cell carcinoma diagnosed in 2016, progressed in 2019. Currently experiencing significant weakness, poor oral intake, and frequent falls. Discussed starting belzutifan Annett Fabian) versus palliative care and hospice. Patient prefers to try belzutifan. Quality of life is poor, and the cancer is advanced with no curative options. Risks of belzutifan include potential worsening of symptoms and no guarantee of improvement. Benefits include potential stabilization of cancer. If condition worsens, palliative care and hospice will be considered. - Start belzutifan Annett Fabian) - Monitor response to medication - Discuss palliative care and hospice options if condition worsens  Anemia Chronic anemia with hemoglobin levels around  8.3-8.4, likely contributing to fatigue and shortness of breath. Scheduled for iron infusion to address iron deficiency. - Administer iron infusion on Friday - Monitor hemoglobin levels  General Health Maintenance Poor nutritional intake and hydration. Discussed the importance of maintaining adequate nutrition and hydration. - Encourage increased fluid intake - Encourage balanced diet and nutritional supplements  Follow-up - Schedule follow-up appointment in two weeks.  For the maxillary pain, it has improved and he will continue with his current treatment by the palliative care team. For the maxillary fungal infection, the patient underwent treatment with posaconazole and followed by infectious disease. The hypokalemia which is a chronic problem, we will start the patient on potassium chloride 40 mEq p.o. daily. He was advised to call immediately if he has any other concerning symptoms in the interval. The patient voices understanding of current disease status and treatment options and is in agreement with the current care plan.  All questions were answered. The patient knows to call the clinic with any problems, questions or concerns. We can certainly see the patient much sooner if necessary. The total time spent in the appointment was 30 minutes.  Disclaimer: This note was dictated with voice recognition software. Similar sounding words can inadvertently be transcribed and may not be corrected upon review.

## 2022-12-29 NOTE — Telephone Encounter (Signed)
CRITICAL VALUE STICKER  CRITICAL VALUE:  potassium 2.3  RECEIVER (on-site recipient of call): Daneil Dolin, LPN  DATE & TIME NOTIFIED: 12/29/22  3:52  MESSENGER (representative from lab): Pam  MD NOTIFIED:  Si Gaul, MD   TIME OF NOTIFICATION:3:54  RESPONSE:

## 2022-12-31 ENCOUNTER — Telehealth: Payer: Self-pay | Admitting: Internal Medicine

## 2023-01-01 ENCOUNTER — Inpatient Hospital Stay: Payer: Medicare HMO

## 2023-01-01 VITALS — BP 118/60 | HR 92 | Temp 98.2°F | Resp 18

## 2023-01-01 DIAGNOSIS — D638 Anemia in other chronic diseases classified elsewhere: Secondary | ICD-10-CM

## 2023-01-01 DIAGNOSIS — D509 Iron deficiency anemia, unspecified: Secondary | ICD-10-CM

## 2023-01-01 DIAGNOSIS — R531 Weakness: Secondary | ICD-10-CM

## 2023-01-01 DIAGNOSIS — D649 Anemia, unspecified: Secondary | ICD-10-CM | POA: Diagnosis not present

## 2023-01-01 MED ORDER — ACETAMINOPHEN 325 MG PO TABS
650.0000 mg | ORAL_TABLET | Freq: Once | ORAL | Status: AC
  Administered 2023-01-01: 650 mg via ORAL
  Filled 2023-01-01: qty 2

## 2023-01-01 MED ORDER — LORATADINE 10 MG PO TABS
10.0000 mg | ORAL_TABLET | Freq: Once | ORAL | Status: AC
  Administered 2023-01-01: 10 mg via ORAL
  Filled 2023-01-01: qty 1

## 2023-01-01 MED ORDER — SODIUM CHLORIDE 0.9 % IV SOLN: INTRAVENOUS | Status: DC

## 2023-01-01 MED ORDER — SODIUM CHLORIDE 0.9 % IV SOLN
300.0000 mg | Freq: Once | INTRAVENOUS | Status: AC
  Administered 2023-01-01: 300 mg via INTRAVENOUS
  Filled 2023-01-01: qty 300

## 2023-01-01 NOTE — Patient Instructions (Signed)
Iron Sucrose Injection What is this medication? IRON SUCROSE (EYE ern SOO krose) treats low levels of iron (iron deficiency anemia) in people with kidney disease. Iron is a mineral that plays an important role in making red blood cells, which carry oxygen from your lungs to the rest of your body. This medicine may be used for other purposes; ask your health care provider or pharmacist if you have questions. COMMON BRAND NAME(S): Venofer What should I tell my care team before I take this medication? They need to know if you have any of these conditions: Anemia not caused by low iron levels Heart disease High levels of iron in the blood Kidney disease Liver disease An unusual or allergic reaction to iron, other medications, foods, dyes, or preservatives Pregnant or trying to get pregnant Breastfeeding How should I use this medication? This medication is for infusion into a vein. It is given in a hospital or clinic setting. Talk to your care team about the use of this medication in children. While this medication may be prescribed for children as young as 2 years for selected conditions, precautions do apply. Overdosage: If you think you have taken too much of this medicine contact a poison control center or emergency room at once. NOTE: This medicine is only for you. Do not share this medicine with others. What if I miss a dose? Keep appointments for follow-up doses. It is important not to miss your dose. Call your care team if you are unable to keep an appointment. What may interact with this medication? Do not take this medication with any of the following: Deferoxamine Dimercaprol Other iron products This medication may also interact with the following: Chloramphenicol Deferasirox This list may not describe all possible interactions. Give your health care provider a list of all the medicines, herbs, non-prescription drugs, or dietary supplements you use. Also tell them if you smoke,  drink alcohol, or use illegal drugs. Some items may interact with your medicine. What should I watch for while using this medication? Visit your care team regularly. Tell your care team if your symptoms do not start to get better or if they get worse. You may need blood work done while you are taking this medication. You may need to follow a special diet. Talk to your care team. Foods that contain iron include: whole grains/cereals, dried fruits, beans, or peas, leafy green vegetables, and organ meats (liver, kidney). What side effects may I notice from receiving this medication? Side effects that you should report to your care team as soon as possible: Allergic reactions--skin rash, itching, hives, swelling of the face, lips, tongue, or throat Low blood pressure--dizziness, feeling faint or lightheaded, blurry vision Shortness of breath Side effects that usually do not require medical attention (report to your care team if they continue or are bothersome): Flushing Headache Joint pain Muscle pain Nausea Pain, redness, or irritation at injection site This list may not describe all possible side effects. Call your doctor for medical advice about side effects. You may report side effects to FDA at 1-800-FDA-1088. Where should I keep my medication? This medication is given in a hospital or clinic. It will not be stored at home. NOTE: This sheet is a summary. It may not cover all possible information. If you have questions about this medicine, talk to your doctor, pharmacist, or health care provider.  2024 Elsevier/Gold Standard (2022-07-10 00:00:00)

## 2023-01-08 ENCOUNTER — Other Ambulatory Visit (HOSPITAL_COMMUNITY): Payer: Self-pay

## 2023-01-08 ENCOUNTER — Encounter: Payer: Self-pay | Admitting: Physician Assistant

## 2023-01-08 ENCOUNTER — Other Ambulatory Visit: Payer: Self-pay

## 2023-01-08 ENCOUNTER — Other Ambulatory Visit: Payer: Self-pay | Admitting: Nurse Practitioner

## 2023-01-08 DIAGNOSIS — C649 Malignant neoplasm of unspecified kidney, except renal pelvis: Secondary | ICD-10-CM

## 2023-01-08 DIAGNOSIS — F419 Anxiety disorder, unspecified: Secondary | ICD-10-CM

## 2023-01-08 MED ORDER — DIAZEPAM 2 MG PO TABS
ORAL_TABLET | ORAL | 0 refills | Status: DC
  Filled 2023-01-08 (×2): qty 60, 10d supply, fill #0

## 2023-01-09 ENCOUNTER — Other Ambulatory Visit (HOSPITAL_COMMUNITY): Payer: Self-pay

## 2023-01-11 ENCOUNTER — Inpatient Hospital Stay: Payer: Medicare HMO

## 2023-01-11 ENCOUNTER — Inpatient Hospital Stay: Payer: Medicare HMO | Admitting: Internal Medicine

## 2023-01-11 ENCOUNTER — Telehealth: Payer: Self-pay | Admitting: Medical Oncology

## 2023-01-11 VITALS — BP 114/67 | HR 107 | Temp 99.5°F | Resp 16 | Ht 68.0 in | Wt 152.6 lb

## 2023-01-11 DIAGNOSIS — C641 Malignant neoplasm of right kidney, except renal pelvis: Secondary | ICD-10-CM

## 2023-01-11 DIAGNOSIS — E876 Hypokalemia: Secondary | ICD-10-CM | POA: Diagnosis not present

## 2023-01-11 DIAGNOSIS — C649 Malignant neoplasm of unspecified kidney, except renal pelvis: Secondary | ICD-10-CM

## 2023-01-11 DIAGNOSIS — D649 Anemia, unspecified: Secondary | ICD-10-CM | POA: Diagnosis not present

## 2023-01-11 LAB — CMP (CANCER CENTER ONLY)
ALT: 7 U/L (ref 0–44)
AST: 11 U/L — ABNORMAL LOW (ref 15–41)
Albumin: 2.7 g/dL — ABNORMAL LOW (ref 3.5–5.0)
Alkaline Phosphatase: 233 U/L — ABNORMAL HIGH (ref 38–126)
Anion gap: 10 (ref 5–15)
BUN: 8 mg/dL (ref 8–23)
CO2: 38 mmol/L — ABNORMAL HIGH (ref 22–32)
Calcium: 7.9 mg/dL — ABNORMAL LOW (ref 8.9–10.3)
Chloride: 92 mmol/L — ABNORMAL LOW (ref 98–111)
Creatinine: 0.84 mg/dL (ref 0.61–1.24)
GFR, Estimated: >60 mL/min (ref >60)
Glucose, Bld: 181 mg/dL — ABNORMAL HIGH (ref 70–99)
Potassium: 2.3 mmol/L — CL (ref 3.5–5.1)
Sodium: 140 mmol/L (ref 135–145)
Total Bilirubin: 0.8 mg/dL (ref <1.2)
Total Protein: 6.3 g/dL — ABNORMAL LOW (ref 6.5–8.1)

## 2023-01-11 LAB — CBC WITH DIFFERENTIAL (CANCER CENTER ONLY)
Abs Immature Granulocytes: 0.09 10*3/uL — ABNORMAL HIGH (ref 0.00–0.07)
Basophils Absolute: 0 10*3/uL (ref 0.0–0.1)
Basophils Relative: 0 %
Eosinophils Absolute: 0.1 10*3/uL (ref 0.0–0.5)
Eosinophils Relative: 0 %
HCT: 30.2 % — ABNORMAL LOW (ref 39.0–52.0)
Hemoglobin: 8.5 g/dL — ABNORMAL LOW (ref 13.0–17.0)
Immature Granulocytes: 1 %
Lymphocytes Relative: 12 %
Lymphs Abs: 1.4 10*3/uL (ref 0.7–4.0)
MCH: 24 pg — ABNORMAL LOW (ref 26.0–34.0)
MCHC: 28.1 g/dL — ABNORMAL LOW (ref 30.0–36.0)
MCV: 85.3 fL (ref 80.0–100.0)
Monocytes Absolute: 0.9 10*3/uL (ref 0.1–1.0)
Monocytes Relative: 8 %
Neutro Abs: 9.6 10*3/uL — ABNORMAL HIGH (ref 1.7–7.7)
Neutrophils Relative %: 79 %
Platelet Count: 357 10*3/uL (ref 150–400)
RBC: 3.54 MIL/uL — ABNORMAL LOW (ref 4.22–5.81)
RDW: 19.9 % — ABNORMAL HIGH (ref 11.5–15.5)
WBC Count: 12.1 10*3/uL — ABNORMAL HIGH (ref 4.0–10.5)
nRBC: 0 % (ref 0.0–0.2)

## 2023-01-11 LAB — IRON AND IRON BINDING CAPACITY (CC-WL,HP ONLY)
Iron: 15 ug/dL — ABNORMAL LOW (ref 45–182)
Saturation Ratios: 14 % — ABNORMAL LOW (ref 17.9–39.5)
TIBC: 111 ug/dL — ABNORMAL LOW (ref 250–450)
UIBC: 96 ug/dL — ABNORMAL LOW (ref 117–376)

## 2023-01-11 LAB — FERRITIN: Ferritin: 4523 ng/mL — ABNORMAL HIGH (ref 24–336)

## 2023-01-11 LAB — LACTATE DEHYDROGENASE: LDH: 265 U/L — ABNORMAL HIGH (ref 98–192)

## 2023-01-11 MED ORDER — POTASSIUM CHLORIDE 20 MEQ/15ML (10%) PO SOLN: 20.0000 meq | Freq: Two times a day (BID) | ORAL | 0 refills | Status: DC

## 2023-01-11 MED ORDER — SODIUM CHLORIDE 0.9% IV SOLUTION
250.0000 mL | INTRAVENOUS | Status: DC
  Administered 2023-01-11: 250 mL via INTRAVENOUS

## 2023-01-11 MED ORDER — POTASSIUM CHLORIDE 10 MEQ/100ML IV SOLN
10.0000 meq | INTRAVENOUS | Status: DC
Start: 2023-01-11 — End: 2023-01-11

## 2023-01-11 MED ORDER — POTASSIUM CHLORIDE IN NACL 40-0.9 MEQ/L-% IV SOLN
Freq: Once | INTRAVENOUS | Status: AC
  Administered 2023-01-11: 250 mL/h via INTRAVENOUS
  Filled 2023-01-11: qty 1000

## 2023-01-11 NOTE — Telephone Encounter (Signed)
Dtr bringing pt back for IV potasssium.

## 2023-01-11 NOTE — Progress Notes (Signed)
[facility] [phone]   [phone]  OFFICE PROGRESS NOTE  Alex Mask, MD [address] [place] [address]  DIAGNOSIS: Stage IV clear-cell renal cell carcinoma diagnosed initially in 2016 as localized T1 a disease with evidence of metastatic disease to the liver and omentum in 2019.  PRIOR THERAPY: 1) status post radical right renal nephrectomy under the care of Dr. Marlou Perry on February 01, 2015 and the final pathology revealed 3.3 cm clear renal cell carcinoma with Fuhrman grade 3. 2) status post omental biopsy in July 2019 that confirmed the presence of recurrent metastatic renal cell carcinoma. 3) status post treatment with immunotherapy with ipilimumab 1 Mg/KG and nivolumab 3 mg/KG every 3 weeks started 09/21/2017 status post 4 cycles and the patient did not receive any additional immunotherapy secondary to adrenal insufficiency and panhypopituitarism 4) status post radiotherapy to the abdominal lymph node completed November 08, 2019. 5) Cabometyx initially started at 40 mg p.o. daily in March 2023 and reduced to 20 mg p.o. daily in April 2023.  This was discontinued in June 2024 secondary to disease progression. 6) Axitinib 2 mg p.o. twice daily.  First dose started 07/29/2022.  This was discontinued on November 12, 2022 secondary to disease progression.   CURRENT THERAPY: 120 mg p.o. daily of belzutifan, starting in the next few days.   INTERVAL HISTORY: [name] 77 y.o. male returns to the clinic today for follow-up visit accompanied by his daughter [name].Discussed the use of AI scribe software for clinical note transcription with the patient, who gave verbal consent to proceed.  History of Present Illness   Mr. Perry, a 77 year old patient with a history of stage four kidney cancer diagnosed in 2019, presents with ongoing weakness and inconsistent appetite. The patient's daughter reports that he has good and bad days with  eating, with some days of adequate intake followed by two days of poor intake. There are no reports of nausea, vomiting, or diarrhea. The patient's breathing occasionally becomes strained, as if he can't catch his breath, but oxygen levels remain above 92%.  The patient has not yet started the newly prescribed medication, belzutifan, for his kidney cancer. Previous treatments have included immunotherapy and oral drugs such as cabometyx and axitinib. The patient's weight is currently 152.6 lbs, slightly increased from the previous weight of 149 lbs before fluid retention began. The patient also has anemia, with a hemoglobin level of 8.6, but does not require transfusion at this time.      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  MEDICATIONS:  Current Outpatient Medications  Medication Sig Dispense Refill   acetaminophen (TYLENOL) 500 MG tablet Take 1,000 mg by mouth as needed for mild pain (pain score 1-3) or moderate  pain (pain score 4-6).     diazepam (VALIUM) 2 MG tablet TAKE 1-2 TABLETS BY MOUTH EVERY 8 HOURS AS NEEDED FOR ANXIETY 60 tablet 0   diltiazem (CARDIZEM CD) 240 MG 24 hr capsule Take 1 capsule (240 mg total) by mouth in the morning. 90 capsule 3   famotidine (PEPCID) 20 MG tablet Take 20 mg by mouth daily.     fluticasone (FLONASE) 50 MCG/ACT nasal spray Place 1 spray into both nostrils as  needed for allergies.     furosemide (LASIX) 20 MG tablet Take 20 mg by mouth daily.     hydrocortisone (CORTEF) 20 MG tablet Take 20 mg by mouth in the morning.     hydrocortisone (CORTEF) 5 MG tablet Take 1 tablet (5 mg total) by mouth See admin instructions. Daily at 3pm.     levothyroxine (SYNTHROID) 150 MCG tablet Take 1 tablet (150 mcg total) by mouth daily before breakfast. 90 tablet 3   loratadine (CLARITIN) 10 MG tablet Take 10 mg by mouth daily.     megestrol (MEGACE) 20 MG tablet TAKE 1 TABLET BY MOUTH DAILY 30 tablet 1   metoprolol succinate (TOPROL-XL) 100 MG 24 hr tablet Take 1 tablet (100 mg total) by mouth daily. Take with or immediately following a meal. 30 tablet 0   Multiple Vitamins-Minerals (CENTRUM SILVER 50+MEN) TABS Take 1 tablet by mouth at bedtime.     mupirocin ointment (BACTROBAN) 2 % Place 1 Application into the nose daily as needed (sores).     posaconazole (NOXAFIL) 100 MG TBEC delayed-release tablet Take 3 tablets (300 mg total) by mouth daily. 42 tablet 2   potassium chloride 20 MEQ/15ML (10%) SOLN Take 15 mLs (20 mEq total) by mouth 2 (two) times daily. 300 mL 0   VITAMIN D PO Take 1 capsule by mouth at bedtime.     No current facility-administered medications for this visit.    SURGICAL HISTORY:  Past Surgical History:  Procedure Laterality Date   APPENDECTOMY  age 29   ARTERY BIOPSY Left 04/10/2022   Procedure: LEFT TEMPORAL ARTERY BIOPSY;  Surgeon: Alex Hint, MD;  Location: [facility] OR;  Service: Vascular;  Laterality: Left;   CHOLECYSTECTOMY  10/18/1978   EYE SURGERY Bilateral 2019   cataract   LAPAROSCOPIC LYSIS OF ADHESIONS N/A 08/01/2015   Procedure: LAPAROSCOPIC LYSIS OF ADHESIONS;  Surgeon: Alex Soda, MD;  Location: [facility] ORS;  Service: General;  Laterality: N/A;   LUNG LOBECTOMY  02/16/1990   lung cancer- patient has staples in lung not to have MRI per patient    MAXILLARY ANTROSTOMY Left 05/22/2022   Procedure: MAXILLARY ANTROSTOMY;   Surgeon: Alex Boom, DO;  Location: [facility] OR;  Service: ENT;  Laterality: Left;   PILONIDAL CYST EXCISION  02/16/2009   Dr [name]   ROBOTIC ASSITED PARTIAL NEPHRECTOMY Right 02/01/2015   Procedure: RIGHT ROBOTIC ASSISTED LAPAROSOCOPY NEPHRECTOMY;  Surgeon: Crist Fat, MD;  Location: [facility] ORS;  Service: Urology;  Laterality: Right;   SINUS ENDO WITH FUSION Left 05/22/2022   Procedure: SINUS ENDO WITH FUSION;  Surgeon: Alex Boom, DO;  Location: [facility] OR;  Service: ENT;  Laterality: Left;   VENTRAL HERNIA REPAIR N/A 08/01/2015   Procedure: LAPAROSCOPIC VENTRAL WALL HERNIA WITH MESH;  Surgeon: Alex Soda, MD;  Location: [facility] ORS;  Service: General;  Laterality: N/A;    REVIEW OF SYSTEMS:  A comprehensive review of systems was negative except for: Constitutional: positive for fatigue Respiratory: positive for dyspnea on exertion  Musculoskeletal: positive for arthralgias and muscle weakness   PHYSICAL EXAMINATION: General appearance: alert, cooperative, fatigued, and no distress Head: Normocephalic, without obvious abnormality, atraumatic Neck: no adenopathy, no JVD, supple, symmetrical, trachea midline, and thyroid not enlarged, symmetric, no tenderness/mass/nodules Lymph nodes: Cervical, supraclavicular, and axillary nodes normal. Resp: clear to auscultation bilaterally Back: symmetric, no curvature. ROM normal. No CVA tenderness. Cardio: regular rate and rhythm, S1, S2 normal, no murmur, click, rub or gallop GI: soft, non-tender; bowel sounds normal; no masses,  no organomegaly Extremities: extremities normal, atraumatic, no cyanosis or edema  ECOG PERFORMANCE STATUS: 1 - Symptomatic but completely ambulatory  Blood pressure 114/67, pulse (!) 107, temperature 99.5 F (37.5 C), temperature source Temporal, resp. rate 16, height 5\' 8"  (1.727 m), weight 152 lb 9.6 oz (69.2 kg), SpO2 97%.  LABORATORY DATA: Lab Results  Component Value Date   WBC 14.6 (H) 12/29/2022   HGB  8.4 (L) 12/29/2022   HCT 29.2 (L) 12/29/2022   MCV 82.5 12/29/2022   PLT 375 12/29/2022      Chemistry      Component Value Date/Time   NA 139 12/29/2022 1458   NA 141 08/11/2021 1252   K 2.3 (LL) 12/29/2022 1458   CL 94 (L) 12/29/2022 1458   CO2 37 (H) 12/29/2022 1458   BUN 10 12/29/2022 1458   BUN 14 08/11/2021 1252   CREATININE 0.85 12/29/2022 1458   CREATININE 0.72 12/18/2022 0901   CREATININE 0.97 10/12/2022 1022      Component Value Date/Time   CALCIUM 8.0 (L) 12/29/2022 1458   ALKPHOS 157 (H) 12/29/2022 1458   AST 8 (L) 12/29/2022 1458   AST 8 (L) 12/18/2022 0901   ALT 7 12/29/2022 1458   ALT 8 12/18/2022 0901   BILITOT 0.6 12/29/2022 1458   BILITOT 0.5 12/18/2022 0901       RADIOGRAPHIC STUDIES: ECHOCARDIOGRAM COMPLETE  Result Date: 12/21/2022    ECHOCARDIOGRAM REPORT   Patient Name:   Ramere Starr Date of Exam: 12/21/2022 Medical Rec #:  [id number]  Height:       68.0 in Accession #:    [id number] Weight:       156.3 lb Date of Birth:  02/23/1945  BSA:          1.840 m Patient Age:    77 years   BP:           142/85 mmHg Patient Gender: M          HR:           106 bpm. Exam Location:  Inpatient Procedure: 2D Echo, Cardiac Doppler, Color Doppler and Intracardiac            Opacification Agent Indications:    Syncope R55  History:        Patient has prior history of Echocardiogram examinations, most                 recent 03/02/2022. CHF, CAD, Arrythmias:Atrial Fibrillation,                 Signs/Symptoms:Syncope; Risk Factors:Hypertension and                 Dyslipidemia. CKD.  Sonographer:    Lucendia Herrlich RCS Referring Phys: [phone] DAVID MANUEL ORTIZ  Sonographer Comments: Image acquisition challenging due to patient body habitus. IMPRESSIONS  1. Left ventricular ejection fraction, by estimation, is 55 to 60%. The left ventricle has normal function. The left ventricle has no regional wall motion  abnormalities. Left ventricular diastolic parameters are consistent with  Grade I diastolic dysfunction (impaired relaxation).  2. Right ventricular systolic function is normal. The right ventricular size is normal.  3. The mitral valve is normal in structure. Trivial mitral valve regurgitation. No evidence of mitral stenosis.  4. The aortic valve is tricuspid. There is mild calcification of the aortic valve. Aortic valve regurgitation is not visualized. Aortic valve sclerosis/calcification is present, without any evidence of aortic stenosis.  5. The inferior vena cava is normal in size with greater than 50% respiratory variability, suggesting right atrial pressure of 3 mmHg. FINDINGS  Left Ventricle: Left ventricular ejection fraction, by estimation, is 55 to 60%. The left ventricle has normal function. The left ventricle has no regional wall motion abnormalities. Definity contrast agent was given IV to delineate the left ventricular  endocardial borders. The left ventricular internal cavity size was normal in size. There is no left ventricular hypertrophy. Left ventricular diastolic parameters are consistent with Grade I diastolic dysfunction (impaired relaxation). Right Ventricle: The right ventricular size is normal. No increase in right ventricular wall thickness. Right ventricular systolic function is normal. Left Atrium: Left atrial size was normal in size. Right Atrium: Right atrial size was normal in size. Pericardium: There is no evidence of pericardial effusion. Mitral Valve: The mitral valve is normal in structure. Mild mitral annular calcification. Trivial mitral valve regurgitation. No evidence of mitral valve stenosis. Tricuspid Valve: The tricuspid valve is normal in structure. Tricuspid valve regurgitation is not demonstrated. No evidence of tricuspid stenosis. Aortic Valve: The aortic valve is tricuspid. There is mild calcification of the aortic valve. Aortic valve regurgitation is not visualized. Aortic valve sclerosis/calcification is present, without any evidence of  aortic stenosis. Aortic valve peak gradient measures 5.0 mmHg. Pulmonic Valve: The pulmonic valve was not well visualized. Pulmonic valve regurgitation is not visualized. No evidence of pulmonic stenosis. Aorta: The ascending aorta was not well visualized and the aortic root was not well visualized. Venous: The inferior vena cava is normal in size with greater than 50% respiratory variability, suggesting right atrial pressure of 3 mmHg. IAS/Shunts: No atrial level shunt detected by color flow Doppler.  LEFT VENTRICLE PLAX 2D LVIDd:         4.30 cm   Diastology LVIDs:         3.70 cm   LV e' medial:    6.20 cm/s LV PW:         0.70 cm   LV E/e' medial:  14.6 LV IVS:        0.90 cm   LV e' lateral:   8.27 cm/s LVOT diam:     2.10 cm   LV E/e' lateral: 11.0 LV SV:         41 LV SV Index:   22 LVOT Area:     3.46 cm  RIGHT VENTRICLE             IVC RV S prime:     12.10 cm/s  IVC diam: 1.90 cm TAPSE (M-mode): 1.3 cm LEFT ATRIUM             Index        RIGHT ATRIUM          Index LA diam:        2.60 cm 1.41 cm/m   RA Area:     7.10 cm LA Vol (A2C):   37.8 ml 20.54 ml/m  RA Volume:   8.04 ml  4.37 ml/m  LA Vol (A4C):   25.8 ml 14.02 ml/m LA Biplane Vol: 34.0 ml 18.47 ml/m  AORTIC VALVE AV Area (Vmax): 2.20 cm AV Vmax:        112.00 cm/s AV Peak Grad:   5.0 mmHg LVOT Vmax:      71.10 cm/s LVOT Vmean:     46.000 cm/s LVOT VTI:       0.119 m MITRAL VALVE                TRICUSPID VALVE MV Area (PHT): 6.27 cm     TR Peak grad:   10.9 mmHg MV Decel Time: 121 msec     TR Vmax:        165.00 cm/s MR Peak grad: 19.0 mmHg MR Vmax:      218.00 cm/s   SHUNTS MV E velocity: 90.60 cm/s   Systemic VTI:  0.12 m MV A velocity: 113.00 cm/s  Systemic Diam: 2.10 cm MV E/A ratio:  0.80 Arvilla Meres MD Electronically signed by Arvilla Meres MD Signature Date/Time: 12/21/2022/10:31:26 AM    Final    DG Pelvis Portable  Result Date: 12/20/2022 CLINICAL DATA:  Multiple falls EXAM: PORTABLE PELVIS 1-2 VIEWS COMPARISON:   11/27/2022 FINDINGS: There is no evidence of pelvic fracture or diastasis. No fracture or dislocation of the hips. No pelvic bone lesions are seen. IMPRESSION: Negative. Electronically Signed   By: Duanne Guess D.O.   On: 12/20/2022 18:53   CT Head Wo Contrast  Result Date: 12/20/2022 CLINICAL DATA:  Multiple falls including last night. Hit back of head. Posterior bruising and redness. EXAM: CT HEAD WITHOUT CONTRAST CT CERVICAL SPINE WITHOUT CONTRAST TECHNIQUE: Multidetector CT imaging of the head and cervical spine was performed following the standard protocol without intravenous contrast. Multiplanar CT image reconstructions of the cervical spine were also generated. RADIATION DOSE REDUCTION: This exam was performed according to the departmental dose-optimization program which includes automated exposure control, adjustment of the mA and/or kV according to patient size and/or use of iterative reconstruction technique. COMPARISON:  11/27/2022 FINDINGS: CT HEAD FINDINGS Brain: There is no evidence for acute hemorrhage, hydrocephalus, mass lesion, or abnormal extra-axial fluid collection. No definite CT evidence for acute infarction. Diffuse loss of parenchymal volume is consistent with atrophy. Patchy low attenuation in the deep hemispheric and periventricular white matter is nonspecific, but likely reflects chronic microvascular ischemic demyelination. Vascular: No hyperdense vessel or unexpected calcification. Skull: No evidence for fracture. No worrisome lytic or sclerotic lesion. Sinuses/Orbits: The visualized paranasal sinuses and mastoid air cells are clear. Visualized portions of the globes and intraorbital fat are unremarkable. Other: None. CT CERVICAL SPINE FINDINGS Alignment: No evidence for traumatic subluxation. Skull base and vertebrae: No acute fracture. No primary bone lesion or focal pathologic process. Soft tissues and spinal canal: No prevertebral fluid or swelling. No visible canal  hematoma. Disc levels: Mild loss of disc height noted C6-7 with endplate spurring. Facet degeneration noted on the right at C2-3 and C3-4. Upper chest: Unremarkable. Other: None IMPRESSION: 1. No acute intracranial abnormality. 2. Atrophy with chronic small vessel white matter ischemic disease. 3. No cervical spine fracture or traumatic subluxation. Electronically Signed   By: Kennith Center M.D.   On: 12/20/2022 11:37   CT Cervical Spine Wo Contrast  Result Date: 12/20/2022 CLINICAL DATA:  Multiple falls including last night. Hit back of head. Posterior bruising and redness. EXAM: CT HEAD WITHOUT CONTRAST CT CERVICAL SPINE WITHOUT CONTRAST TECHNIQUE: Multidetector CT imaging of the head and cervical spine  was performed following the standard protocol without intravenous contrast. Multiplanar CT image reconstructions of the cervical spine were also generated. RADIATION DOSE REDUCTION: This exam was performed according to the departmental dose-optimization program which includes automated exposure control, adjustment of the mA and/or kV according to patient size and/or use of iterative reconstruction technique. COMPARISON:  11/27/2022 FINDINGS: CT HEAD FINDINGS Brain: There is no evidence for acute hemorrhage, hydrocephalus, mass lesion, or abnormal extra-axial fluid collection. No definite CT evidence for acute infarction. Diffuse loss of parenchymal volume is consistent with atrophy. Patchy low attenuation in the deep hemispheric and periventricular white matter is nonspecific, but likely reflects chronic microvascular ischemic demyelination. Vascular: No hyperdense vessel or unexpected calcification. Skull: No evidence for fracture. No worrisome lytic or sclerotic lesion. Sinuses/Orbits: The visualized paranasal sinuses and mastoid air cells are clear. Visualized portions of the globes and intraorbital fat are unremarkable. Other: None. CT CERVICAL SPINE FINDINGS Alignment: No evidence for traumatic  subluxation. Skull base and vertebrae: No acute fracture. No primary bone lesion or focal pathologic process. Soft tissues and spinal canal: No prevertebral fluid or swelling. No visible canal hematoma. Disc levels: Mild loss of disc height noted C6-7 with endplate spurring. Facet degeneration noted on the right at C2-3 and C3-4. Upper chest: Unremarkable. Other: None IMPRESSION: 1. No acute intracranial abnormality. 2. Atrophy with chronic small vessel white matter ischemic disease. 3. No cervical spine fracture or traumatic subluxation. Electronically Signed   By: Kennith Center M.D.   On: 12/20/2022 11:37   DG Chest 2 View  Result Date: 12/20/2022 CLINICAL DATA:  Multiple falls over the past week.  Tachycardia. EXAM: CHEST - 2 VIEW COMPARISON:  Radiographs 12/08/2022 and 11/27/2022.  CT 11/04/2022. FINDINGS: 1049 hours. The heart size and mediastinal contours are stable. There is stable volume loss in the right hemithorax status post previous right middle and lower lobectomy. Stable pleural thickening on the right. The lungs are clear. There is no pleural effusion or pneumothorax. No acute osseous findings are seen. IMPRESSION: Stable chest status post right middle and lower lobectomy. No evidence of acute cardiopulmonary process. Electronically Signed   By: Carey Bullocks M.D.   On: 12/20/2022 11:12    ASSESSMENT AND PLAN: This is a very pleasant 77 years old white male with Stage IV clear-cell renal cell carcinoma diagnosed initially in 2016 as localized T1 a disease with evidence of metastatic disease to the liver and omentum in 2019. The patient underwent the following treatment: 1) status post radical right renal nephrectomy under the care of Dr. Marlou Perry on February 01, 2015 and the final pathology revealed 3.3 cm clear renal cell carcinoma with Fuhrman grade 3. 2) status post omental biopsy in July 2019 that confirmed the presence of recurrent metastatic renal cell carcinoma. 3) status post  treatment with immunotherapy with ipilimumab 1 Mg/KG and nivolumab 3 mg/KG every 3 weeks started 09/21/2017 status post 4 cycles and the patient did not receive any additional immunotherapy secondary to adrenal insufficiency and panhypopituitarism 4) status post radiotherapy to the abdominal lymph node completed November 08, 2019. He was on treatment with Cabometyx 20 mg p.o. daily started March 2023.  His treatment has been on hold for the last 2 months because of the recent sinus issues and his final diagnosis was fungal sinusitis and currently on treatment with voriconazole by infectious disease.  Unfortunately his scan showed evidence for disease progression in the abdomen including the pancreatic mass as well as peripancreatic and retroperitoneal lymphadenopathy as well as  liver lesions. I recommended for the patient to discontinue his current treatment with Cabometyx at this point. We considered the patient for treatment with axitinib initially at 5 mg p.o. twice daily but because of drug interaction with posaconazole, we will reduce his dose of treatment to 2 mg p.o. twice daily for now.  He has been tolerating this treatment fairly well. He had repeat CT scan of the chest, abdomen and pelvis performed on 11/04/2022 and that showed interval progression of the hepatic metastatic disease with new low-attenuation lesion within the liver when compared to the previous scan of May 03, 2022. The large mass in the region of the uncinate process of the pancreas measures 7.5 x 7.8 cm and larger than the previous scan.  There was also increase in size of upper abdominal lymph nodes compatible with nodal metastasis and increase in the caliber of the proximal common bile duct which measured 1.4 cm.  There was no signs of pulmonary metastasis. I recommended for the patient to discontinue his treatment with axitinib at this point. He was seen by Dr. Clarene Duke at [facility] who recommended for him  treatment with belzutifan.  The patient has the medication at home but he has not started it because of the frequent hospitalization.     Stage IV Kidney Cancer Stage IV kidney cancer of the right kidney, diagnosed in 2019. Underwent multiple treatments including immunotherapy and oral drugs such as cabometyx and axitinib. Currently experiencing weakness and inconsistent eating patterns. No nausea, vomiting, or diarrhea. Oxygen levels consistently above 92%. Hemoglobin is 8.6, indicating anemia but not severe enough to require transfusion. Has not started belzutifan yet. Discussed starting belzutifan, explaining that it may help the condition but can be discontinued if side effects are intolerable. Emphasized the importance of trying the medication as options are running out and the disease will progress without treatment. - Start belzutifan - Monitor for side effects and discontinue if intolerable - Schedule follow-up scan to assess treatment efficacy - Follow-up in three weeks, or sooner if needed  Anemia Hemoglobin level is 8.6, indicating anemia. The condition is not severe enough to require a transfusion at this time. - Monitor hemoglobin levels  General Health Maintenance Blood pressure is reasonably controlled. Weight is 152.6 lbs, consistent with previous measurements. - Continue monitoring blood pressure and weight.   For the maxillary pain, it has improved and he will continue with his current treatment by the palliative care team. For the maxillary fungal infection, the patient underwent treatment with posaconazole and followed by infectious disease. The hypokalemia which is a chronic problem, we will start the patient on potassium chloride 40 mEq p.o. daily.  We will also arrange for the patient to receive potassium chloride IV 40 mill equivalent in the clinic today. For the iron deficiency anemia, I will arrange for the patient to receive iron infusion with Venofer 300 Mg IV weekly  for 3 weeks starting next week. The patient was advised to call immediately if he has any concerning symptoms in the interval. The patient voices understanding of current disease status and treatment options and is in agreement with the current care plan.  All questions were answered. The patient knows to call the clinic with any problems, questions or concerns. We can certainly see the patient much sooner if necessary. The total time spent in the appointment was 30 minutes.  Disclaimer: This note was dictated with voice recognition software. Similar sounding words can inadvertently be transcribed and may not  be corrected upon review.

## 2023-01-11 NOTE — Progress Notes (Signed)
Potassium infusion was discontinued early due to transportation issues. Dr. Arbutus Ped made aware. Patient advised to pick up oral potassium at his pharmacy before going home today and instructed to take medication as advised. Discussed RTC or calling office with any concerns. Patient VSS at discharge with daughter via wheelchair to lobby.

## 2023-01-11 NOTE — Telephone Encounter (Signed)
CRITICAL VALUE STICKER  CRITICAL VALUE:K==2.3  RECEIVER (on-site recipient of call):Britnie Colville  DATE & TIME NOTIFIED: 01/11/23 @0925   MESSENGER (representative from lab):Herbert Seta  MD NOTIFIED: Arbutus Ped  TIME OF NOTIFICATION:0926  RESPONSE:  Addditional potassium ordered.

## 2023-01-12 ENCOUNTER — Telehealth: Payer: Self-pay | Admitting: Physician Assistant

## 2023-01-12 NOTE — Telephone Encounter (Signed)
Patient and Patient's daughter is aware of scheduled appointment times/dates

## 2023-01-12 NOTE — Progress Notes (Unsigned)
Cardiology Office Note:    Date:  01/12/2023   ID:  [name], DOB [birth year], MRN [id number]  PCP:  [name], MD  Cardiologist:  Thurmon Fair, MD { Click to update primary MD,subspecialty MD or APP then REFRESH:1}    Referring MD: [name], *   Chief Complaint: ***  History of Present Illness:    [name] is a 77 y.o. male with a history of multivessel CAD including subtotal CTO of mid RCA with collaterals noted on cardiac catheterization in 06/2008, pericardial effusion resolved on last Echo in 08/2018, frequent PVCs, hypertension, hyperlipidemia, adrenal insufficiency, hypothyroidism, GERD, recurrent umbilical hernia, lung cancer s/p right lower lobectomy in 02/1990, and metastatic renal cancer s/p right nephrectomy in 01/2015 who is followed by Dr. Royann Shivers and presents today for routine follow-up.    Patient is a former patient of Dr. Donnie Aho and now follows with Dr. Royann Shivers.  He has known CAD with cardiac catheterization in 06/2008 showing multivessel disease with a subtotal occlusion (95 to 99% stenosis) of the mid RCA with bridging collaterals and left-to-right collaterals, either occluded first diagonal branch for an ulcerated plaque in the proximal portion of the vessel prior to the septal perforator, 80% stenosis and intermediate branch of the LCx, and 50% stenosis of OM1. LVEF was 65-70% at that time.  Last ischemic evaluation was a nuclear stress test in 05/2017 which showed no evidence of ischemia.  Last Echo in 08/2018 showed LVEF of 60-65% with normal wall motion and grade 1 diastolic dysfunction and complete resolution of prior pericardial effusion.  Prior CT scans have shown extensive atherosclerosis in the coronaries, aorta, aortic arch, and abdominal and iliac vessels.  However, none of these are symptomatic.  He has not been on lipid-lowering therapy due to severe muscle weakness  on statins.  He was last seen by Dr. Royann Shivers in 03/2022 at which time  his biggest complaint was right sided jaw pain which he had been experiencing for the last several months and started after an episode of sinusitis. ESR was severely elevated at 93 in 01/2022 and he was prescribed Prednisone with some improvement but no completed resolution. Questionable temporomandibular joint arthritis. He had been seen by a dentist for this multiple times. He was stable from a cardiac standpoint. Jaw pain was not felt to be an anginal equivalent.   Since then he has been seen by multiple specialists including Vascular Surgery, ENT, Oncology, and Infectious Disease. He underwent multiple biopsies and symptoms were felt to be invasive fungal sinusitis. He was started on antifungals by ID.  He has had multiple admissions over the last few months. He was admitted in 06/2022 for hyponatremia after presenting with generalized weakness and fatigue. Sodium was as low as 124. Etiology was felt to be multifactorial secondary to hypovolemic hyponatremia in setting of poor oral intake, adrenal insufficiency, and possible medication side effect with Voriconazole. He was treated with IV fluids and sodium chloride tablets and Hydrocortisone was increased. He was then admitted in 11/2022 with SIRS after presenting with electrolyte abnormalities and anemia. He was admitted in early 12/2022 after multiple falls with questionable syncope. Echo showed LVEF of 55-60% with normal wall motion and grade 1 diastolic dysfunction, normal RV function, and no significant valvular disease. Etiology of falls was felt to be due to orthostasis. It was felt that the risks of anticoagulation outweighed the benefits and Eliquis was stopped.   Patient was readmitted on 01/18/2023 for ***   EKGs/Labs/Other Studies  Reviewed:    The following studies were reviewed today: ***  EKG:  EKG *** ordered today. EKG personally reviewed and demonstrates ***.  Recent Labs: 09/08/2022: TSH 1.85 12/20/2022: B Natriuretic Peptide  569.6; Magnesium 1.5 01/11/2023: ALT 7; BUN 8; Creatinine 0.84; Hemoglobin 8.5; Platelet Count 357; Potassium 2.3; Sodium 140  Recent Lipid Panel    Component Value Date/Time   CHOL 153 03/26/2022 1036   TRIG 123 03/26/2022 1036   HDL 45 03/26/2022 1036   CHOLHDL 3.4 03/26/2022 1036   LDLCALC 86 03/26/2022 1036    Physical Exam:    Vital Signs: There were no vitals taken for this visit.    Wt Readings from Last 3 Encounters:  01/11/23 152 lb 9.6 oz (69.2 kg)  12/29/22 155 lb (70.3 kg)  12/22/22 151 lb 14.4 oz (68.9 kg)     General: 77 y.o. male in no acute distress. HEENT: Normocephalic and atraumatic. Sclera clear.  Neck: Supple. No carotid bruits. No JVD. Heart: *** RRR. Distinct S1 and S2. No murmurs, gallops, or rubs.  Lungs: No increased work of breathing. Clear to ausculation bilaterally. No wheezes, rhonchi, or rales.  Abdomen: Soft, non-distended, and non-tender to palpation.  Extremities: No lower extremity edema.  Radial and distal pedal pulses 2+ and equal bilaterally. Skin: Warm and dry. Neuro: No focal deficits. Psych: Normal affect. Responds appropriately.   Assessment:    No diagnosis found.  Plan:     Disposition: Follow up in ***   Signed, Corrin Parker, PA-C  01/12/2023 2:05 PM    [facility]

## 2023-01-18 ENCOUNTER — Other Ambulatory Visit: Payer: Self-pay

## 2023-01-18 ENCOUNTER — Encounter (HOSPITAL_COMMUNITY): Payer: Self-pay

## 2023-01-18 ENCOUNTER — Inpatient Hospital Stay (HOSPITAL_COMMUNITY): Payer: Medicare HMO

## 2023-01-18 ENCOUNTER — Emergency Department (HOSPITAL_COMMUNITY): Payer: Medicare HMO

## 2023-01-18 ENCOUNTER — Inpatient Hospital Stay (HOSPITAL_COMMUNITY)
Admission: EM | Admit: 2023-01-18 | Discharge: 2023-01-23 | DRG: 186 | Disposition: A | Discharge status: Home Health | Payer: Medicare HMO | Attending: Internal Medicine | Admitting: Internal Medicine

## 2023-01-18 DIAGNOSIS — R000 Tachycardia, unspecified: Secondary | ICD-10-CM | POA: Diagnosis not present

## 2023-01-18 DIAGNOSIS — R296 Repeated falls: Secondary | ICD-10-CM | POA: Diagnosis present

## 2023-01-18 DIAGNOSIS — Y929 Unspecified place or not applicable: Secondary | ICD-10-CM | POA: Diagnosis not present

## 2023-01-18 DIAGNOSIS — E785 Hyperlipidemia, unspecified: Secondary | ICD-10-CM | POA: Diagnosis not present

## 2023-01-18 DIAGNOSIS — Z8249 Family history of ischemic heart disease and other diseases of the circulatory system: Secondary | ICD-10-CM

## 2023-01-18 DIAGNOSIS — E876 Hypokalemia: Principal | ICD-10-CM | POA: Diagnosis present

## 2023-01-18 DIAGNOSIS — I110 Hypertensive heart disease with heart failure: Secondary | ICD-10-CM | POA: Diagnosis not present

## 2023-01-18 DIAGNOSIS — M7989 Other specified soft tissue disorders: Secondary | ICD-10-CM

## 2023-01-18 DIAGNOSIS — D630 Anemia in neoplastic disease: Secondary | ICD-10-CM | POA: Diagnosis present

## 2023-01-18 DIAGNOSIS — Z1152 Encounter for screening for COVID-19: Secondary | ICD-10-CM | POA: Diagnosis not present

## 2023-01-18 DIAGNOSIS — K219 Gastro-esophageal reflux disease without esophagitis: Secondary | ICD-10-CM | POA: Diagnosis present

## 2023-01-18 DIAGNOSIS — E2749 Other adrenocortical insufficiency: Secondary | ICD-10-CM | POA: Diagnosis not present

## 2023-01-18 DIAGNOSIS — R7989 Other specified abnormal findings of blood chemistry: Secondary | ICD-10-CM

## 2023-01-18 DIAGNOSIS — E039 Hypothyroidism, unspecified: Secondary | ICD-10-CM | POA: Diagnosis not present

## 2023-01-18 DIAGNOSIS — J9601 Acute respiratory failure with hypoxia: Secondary | ICD-10-CM | POA: Diagnosis present

## 2023-01-18 DIAGNOSIS — Z79899 Other long term (current) drug therapy: Secondary | ICD-10-CM

## 2023-01-18 DIAGNOSIS — R609 Edema, unspecified: Secondary | ICD-10-CM | POA: Diagnosis not present

## 2023-01-18 DIAGNOSIS — I2510 Atherosclerotic heart disease of native coronary artery without angina pectoris: Secondary | ICD-10-CM | POA: Diagnosis not present

## 2023-01-18 DIAGNOSIS — E0781 Sick-euthyroid syndrome: Secondary | ICD-10-CM | POA: Diagnosis not present

## 2023-01-18 DIAGNOSIS — I5033 Acute on chronic diastolic (congestive) heart failure: Secondary | ICD-10-CM | POA: Diagnosis not present

## 2023-01-18 DIAGNOSIS — R627 Adult failure to thrive: Secondary | ICD-10-CM | POA: Diagnosis present

## 2023-01-18 DIAGNOSIS — Z87891 Personal history of nicotine dependence: Secondary | ICD-10-CM | POA: Diagnosis not present

## 2023-01-18 DIAGNOSIS — R188 Other ascites: Secondary | ICD-10-CM | POA: Diagnosis not present

## 2023-01-18 DIAGNOSIS — Z79890 Hormone replacement therapy (postmenopausal): Secondary | ICD-10-CM | POA: Diagnosis not present

## 2023-01-18 DIAGNOSIS — Z981 Arthrodesis status: Secondary | ICD-10-CM

## 2023-01-18 DIAGNOSIS — Z905 Acquired absence of kidney: Secondary | ICD-10-CM

## 2023-01-18 DIAGNOSIS — C649 Malignant neoplasm of unspecified kidney, except renal pelvis: Secondary | ICD-10-CM | POA: Diagnosis present

## 2023-01-18 DIAGNOSIS — M549 Dorsalgia, unspecified: Secondary | ICD-10-CM

## 2023-01-18 DIAGNOSIS — W19XXXA Unspecified fall, initial encounter: Secondary | ICD-10-CM | POA: Diagnosis present

## 2023-01-18 DIAGNOSIS — M545 Low back pain, unspecified: Secondary | ICD-10-CM | POA: Diagnosis present

## 2023-01-18 DIAGNOSIS — I10 Essential (primary) hypertension: Secondary | ICD-10-CM | POA: Diagnosis present

## 2023-01-18 DIAGNOSIS — Z9049 Acquired absence of other specified parts of digestive tract: Secondary | ICD-10-CM

## 2023-01-18 DIAGNOSIS — D649 Anemia, unspecified: Secondary | ICD-10-CM

## 2023-01-18 DIAGNOSIS — I700 Atherosclerosis of aorta: Secondary | ICD-10-CM | POA: Diagnosis not present

## 2023-01-18 DIAGNOSIS — R600 Localized edema: Secondary | ICD-10-CM

## 2023-01-18 DIAGNOSIS — J90 Pleural effusion, not elsewhere classified: Secondary | ICD-10-CM | POA: Diagnosis not present

## 2023-01-18 DIAGNOSIS — Z902 Acquired absence of lung [part of]: Secondary | ICD-10-CM

## 2023-01-18 DIAGNOSIS — I480 Paroxysmal atrial fibrillation: Secondary | ICD-10-CM | POA: Diagnosis present

## 2023-01-18 DIAGNOSIS — C787 Secondary malignant neoplasm of liver and intrahepatic bile duct: Secondary | ICD-10-CM | POA: Diagnosis not present

## 2023-01-18 DIAGNOSIS — C799 Secondary malignant neoplasm of unspecified site: Secondary | ICD-10-CM

## 2023-01-18 DIAGNOSIS — R911 Solitary pulmonary nodule: Secondary | ICD-10-CM | POA: Diagnosis not present

## 2023-01-18 DIAGNOSIS — Z888 Allergy status to other drugs, medicaments and biological substances status: Secondary | ICD-10-CM

## 2023-01-18 DIAGNOSIS — R918 Other nonspecific abnormal finding of lung field: Secondary | ICD-10-CM | POA: Diagnosis not present

## 2023-01-18 DIAGNOSIS — R9389 Abnormal findings on diagnostic imaging of other specified body structures: Secondary | ICD-10-CM | POA: Diagnosis not present

## 2023-01-18 DIAGNOSIS — R0602 Shortness of breath: Secondary | ICD-10-CM | POA: Diagnosis not present

## 2023-01-18 DIAGNOSIS — Z85118 Personal history of other malignant neoplasm of bronchus and lung: Secondary | ICD-10-CM

## 2023-01-18 DIAGNOSIS — M47816 Spondylosis without myelopathy or radiculopathy, lumbar region: Secondary | ICD-10-CM | POA: Diagnosis not present

## 2023-01-18 DIAGNOSIS — M4807 Spinal stenosis, lumbosacral region: Secondary | ICD-10-CM | POA: Diagnosis not present

## 2023-01-18 LAB — CBC WITH DIFFERENTIAL/PLATELET
Abs Immature Granulocytes: 0.06 10*3/uL (ref 0.00–0.07)
Basophils Absolute: 0 10*3/uL (ref 0.0–0.1)
Basophils Relative: 0 %
Eosinophils Absolute: 0.1 10*3/uL (ref 0.0–0.5)
Eosinophils Relative: 1 %
HCT: 47.9 % (ref 39.0–52.0)
Hemoglobin: 13.1 g/dL (ref 13.0–17.0)
Immature Granulocytes: 1 %
Lymphocytes Relative: 13 %
Lymphs Abs: 1.2 10*3/uL (ref 0.7–4.0)
MCH: 24.4 pg — ABNORMAL LOW (ref 26.0–34.0)
MCHC: 27.3 g/dL — ABNORMAL LOW (ref 30.0–36.0)
MCV: 89.2 fL (ref 80.0–100.0)
Monocytes Absolute: 0.6 10*3/uL (ref 0.1–1.0)
Monocytes Relative: 7 %
Neutro Abs: 6.9 10*3/uL (ref 1.7–7.7)
Neutrophils Relative %: 78 %
Platelets: 242 10*3/uL (ref 150–400)
RBC: 5.37 MIL/uL (ref 4.22–5.81)
RDW: 20 % — ABNORMAL HIGH (ref 11.5–15.5)
WBC: 8.8 10*3/uL (ref 4.0–10.5)
nRBC: 0 % (ref 0.0–0.2)

## 2023-01-18 LAB — PROCALCITONIN: Procalcitonin: 0.95 ng/mL

## 2023-01-18 LAB — HEPATIC FUNCTION PANEL
ALT: 12 U/L (ref 0–44)
AST: 14 U/L — ABNORMAL LOW (ref 15–41)
Albumin: 2.2 g/dL — ABNORMAL LOW (ref 3.5–5.0)
Alkaline Phosphatase: 164 U/L — ABNORMAL HIGH (ref 38–126)
Bilirubin, Direct: 0.1 mg/dL (ref 0.0–0.2)
Indirect Bilirubin: 0.3 mg/dL (ref 0.3–0.9)
Total Bilirubin: 0.4 mg/dL (ref <1.2)
Total Protein: 5.8 g/dL — ABNORMAL LOW (ref 6.5–8.1)

## 2023-01-18 LAB — CBC
HCT: 30.3 % — ABNORMAL LOW (ref 39.0–52.0)
Hemoglobin: 8.6 g/dL — ABNORMAL LOW (ref 13.0–17.0)
MCH: 25.1 pg — ABNORMAL LOW (ref 26.0–34.0)
MCHC: 28.4 g/dL — ABNORMAL LOW (ref 30.0–36.0)
MCV: 88.3 fL (ref 80.0–100.0)
Platelets: 300 10*3/uL (ref 150–400)
RBC: 3.43 MIL/uL — ABNORMAL LOW (ref 4.22–5.81)
RDW: 19.7 % — ABNORMAL HIGH (ref 11.5–15.5)
WBC: 10.5 10*3/uL (ref 4.0–10.5)
nRBC: 0 % (ref 0.0–0.2)

## 2023-01-18 LAB — TROPONIN I (HIGH SENSITIVITY)
Troponin I (High Sensitivity): 19 ng/L — ABNORMAL HIGH (ref <18)
Troponin I (High Sensitivity): 20 ng/L — ABNORMAL HIGH (ref <18)

## 2023-01-18 LAB — BASIC METABOLIC PANEL
Anion gap: 9 (ref 5–15)
BUN: 12 mg/dL (ref 8–23)
CO2: 34 mmol/L — ABNORMAL HIGH (ref 22–32)
Calcium: 7.6 mg/dL — ABNORMAL LOW (ref 8.9–10.3)
Chloride: 96 mmol/L — ABNORMAL LOW (ref 98–111)
Creatinine, Ser: 1.01 mg/dL (ref 0.61–1.24)
GFR, Estimated: >60 mL/min (ref >60)
Glucose, Bld: 97 mg/dL (ref 70–99)
Potassium: <2 mmol/L — CL (ref 3.5–5.1)
Sodium: 139 mmol/L (ref 135–145)

## 2023-01-18 LAB — POTASSIUM: Potassium: 2.3 mmol/L — CL (ref 3.5–5.1)

## 2023-01-18 LAB — RESP PANEL BY RT-PCR (RSV, FLU A&B, COVID)  RVPGX2
Influenza A by PCR: NEGATIVE
Influenza B by PCR: NEGATIVE
Resp Syncytial Virus by PCR: NEGATIVE
SARS Coronavirus 2 by RT PCR: NEGATIVE

## 2023-01-18 LAB — CREATININE, SERUM
Creatinine, Ser: 0.73 mg/dL (ref 0.61–1.24)
Creatinine, Ser: 0.89 mg/dL (ref 0.61–1.24)
GFR, Estimated: >60 mL/min (ref >60)
GFR, Estimated: >60 mL/min (ref >60)

## 2023-01-18 LAB — BRAIN NATRIURETIC PEPTIDE: B Natriuretic Peptide: 440.3 pg/mL — ABNORMAL HIGH (ref 0.0–100.0)

## 2023-01-18 LAB — TSH: TSH: 8.917 u[IU]/mL — ABNORMAL HIGH (ref 0.350–4.500)

## 2023-01-18 LAB — MAGNESIUM: Magnesium: 1.8 mg/dL (ref 1.7–2.4)

## 2023-01-18 LAB — D-DIMER, QUANTITATIVE: D-Dimer, Quant: 4.25 ug{FEU}/mL — ABNORMAL HIGH (ref 0.00–0.50)

## 2023-01-18 MED ORDER — HYDROCORTISONE 10 MG PO TABS
5.0000 mg | ORAL_TABLET | Freq: Every day | ORAL | Status: DC
  Administered 2023-01-19 – 2023-01-23 (×5): 5 mg via ORAL
  Filled 2023-01-18 (×6): qty 1

## 2023-01-18 MED ORDER — POTASSIUM CHLORIDE 10 MEQ/100ML IV SOLN
10.0000 meq | INTRAVENOUS | Status: AC
  Administered 2023-01-18 (×4): 10 meq via INTRAVENOUS
  Filled 2023-01-18 (×3): qty 100

## 2023-01-18 MED ORDER — IOHEXOL 350 MG/ML SOLN
75.0000 mL | Freq: Once | INTRAVENOUS | Status: AC | PRN
  Administered 2023-01-18: 75 mL via INTRAVENOUS

## 2023-01-18 MED ORDER — BELZUTIFAN 40 MG PO TABS: 120.0000 mg | ORAL_TABLET | Freq: Every day | ORAL | Status: DC

## 2023-01-18 MED ORDER — LEVOTHYROXINE SODIUM 50 MCG PO TABS
175.0000 ug | ORAL_TABLET | Freq: Every day | ORAL | Status: DC
  Administered 2023-01-19 – 2023-01-20 (×2): 175 ug via ORAL
  Filled 2023-01-18 (×2): qty 1

## 2023-01-18 MED ORDER — HYDROCORTISONE 10 MG PO TABS
15.0000 mg | ORAL_TABLET | Freq: Every day | ORAL | Status: DC
  Administered 2023-01-19 – 2023-01-23 (×5): 15 mg via ORAL
  Filled 2023-01-18 (×5): qty 2

## 2023-01-18 MED ORDER — MEGESTROL ACETATE 20 MG PO TABS
20.0000 mg | ORAL_TABLET | Freq: Every day | ORAL | Status: DC
Start: 2023-01-18 — End: 2023-01-23
  Administered 2023-01-18 – 2023-01-23 (×6): 20 mg via ORAL
  Filled 2023-01-18 (×6): qty 1

## 2023-01-18 MED ORDER — POTASSIUM CHLORIDE 20 MEQ PO PACK
60.0000 meq | PACK | Freq: Once | ORAL | Status: AC
  Administered 2023-01-18: 60 meq via ORAL
  Filled 2023-01-18: qty 3

## 2023-01-18 MED ORDER — FAMOTIDINE 20 MG PO TABS
20.0000 mg | ORAL_TABLET | Freq: Every day | ORAL | Status: DC
  Administered 2023-01-18 – 2023-01-23 (×6): 20 mg via ORAL
  Filled 2023-01-18 (×6): qty 1

## 2023-01-18 MED ORDER — DIAZEPAM 2 MG PO TABS
2.0000 mg | ORAL_TABLET | Freq: Three times a day (TID) | ORAL | Status: DC | PRN
  Administered 2023-01-20 – 2023-01-21 (×2): 2 mg via ORAL
  Filled 2023-01-18 (×3): qty 1

## 2023-01-18 MED ORDER — ENOXAPARIN SODIUM 40 MG/0.4ML IJ SOSY
40.0000 mg | PREFILLED_SYRINGE | INTRAMUSCULAR | Status: DC
  Administered 2023-01-19: 40 mg via SUBCUTANEOUS
  Filled 2023-01-18: qty 0.4

## 2023-01-18 MED ORDER — ACETAMINOPHEN 325 MG PO TABS: 650.0000 mg | ORAL_TABLET | Freq: Four times a day (QID) | ORAL | Status: DC | PRN

## 2023-01-18 MED ORDER — SODIUM CHLORIDE 0.9% FLUSH
3.0000 mL | Freq: Two times a day (BID) | INTRAVENOUS | Status: DC
  Administered 2023-01-18 – 2023-01-23 (×10): 3 mL via INTRAVENOUS

## 2023-01-18 MED ORDER — METOPROLOL SUCCINATE ER 50 MG PO TB24
100.0000 mg | ORAL_TABLET | Freq: Every day | ORAL | Status: DC
  Administered 2023-01-18 – 2023-01-19 (×2): 100 mg via ORAL
  Filled 2023-01-18 (×2): qty 2

## 2023-01-18 MED ORDER — DILTIAZEM HCL ER COATED BEADS 120 MG PO CP24
240.0000 mg | ORAL_CAPSULE | Freq: Every morning | ORAL | Status: DC
  Administered 2023-01-19: 240 mg via ORAL
  Filled 2023-01-18: qty 2

## 2023-01-18 MED ORDER — MAGNESIUM SULFATE 2 GM/50ML IV SOLN
2.0000 g | Freq: Once | INTRAVENOUS | Status: AC
  Administered 2023-01-18: 2 g via INTRAVENOUS
  Filled 2023-01-18: qty 50

## 2023-01-18 MED ORDER — ACETAMINOPHEN 650 MG RE SUPP: 650.0000 mg | Freq: Four times a day (QID) | RECTAL | Status: DC | PRN

## 2023-01-18 MED ORDER — POLYETHYLENE GLYCOL 3350 17 G PO PACK: 17.0000 g | PACK | Freq: Every day | ORAL | Status: DC | PRN

## 2023-01-18 MED ORDER — SODIUM CHLORIDE 0.9 % IV SOLN: 3.0000 g | Freq: Four times a day (QID) | INTRAVENOUS | Status: DC

## 2023-01-18 MED ORDER — ENSURE ENLIVE PO LIQD
237.0000 mL | Freq: Two times a day (BID) | ORAL | Status: DC
  Administered 2023-01-19 – 2023-01-23 (×8): 237 mL via ORAL

## 2023-01-18 NOTE — Assessment & Plan Note (Addendum)
Bilateral pleural effusion-small Presents with shortness of breath as well as volume overload. Has acutely worsening lower extremity edema. Dopplers are negative for DVT as well as CT PE protocol negative for PE. No evidence of pneumonia. Recent echocardiogram in November 2024 shows EF of 65%.  Diastolic dysfunction.  No significant valvular abnormality. -Diuresed fairly well on Lasix - Continued on oral Lasix at discharge

## 2023-01-18 NOTE — Progress Notes (Signed)
Pharmacy Antibiotic Note  [name] is a 77 y.o. male admitted on 01/18/2023 with hypoxia.  Pharmacy has been consulted for Unasyn dosing for CAP.  Plan: Unasyn 3 g IV q6h Monitor clinical progress and renal function F/U any C&S, abx deescalation / LOT   Height: 5\' 8"  (172.7 cm) Weight: 69 kg (152 lb 1.9 oz) IBW/kg (Calculated) : 68.4  Temp (24hrs), Avg:97.8 F (36.6 C), Min:97.6 F (36.4 C), Max:97.9 F (36.6 C)  Recent Labs  Lab 01/18/23 1133  WBC 8.8  CREATININE 1.01    Estimated Creatinine Clearance: 57.4 mL/min (by C-G formula based on SCr of 1.01 mg/dL).    Allergies  Allergen Reactions   Diphenhydramine Other (See Comments)    Severely restless legs    Antimicrobials this admission: 12/2 Unasyn >>   Microbiology results: 12/2 Resp panel: negative  Thank you for allowing pharmacy to be a part of this patient's care.  Lynden Ang, PharmD, BCPS 01/18/2023 6:14 PM

## 2023-01-18 NOTE — ED Provider Notes (Signed)
[facility] Provider Note   CSN: [id number] Arrival date & time: 01/18/23  1119     History  Chief Complaint  Patient presents with   Shortness of Breath    Gregary Seefried is a 77 y.o. male.  Pt is a 77 yo male with pmhx significant for OA, CKD, hx RCC s/p R nephrectomy, Lung Ca, Pna, CAD, GERD, HLD, HTN, hypothyroidism, paroxysmal afib (Eliquis d/c'd by cards due to frequent falls), and invasive fungal sinusitis.  Pt said he feels fine.  Home health called EMS b/c O2 sats were low.  Pt was 86% on RA when they arrived. Pt's daughter said he's been very weak and has not had an appetite.  He was given a potassium infusion on 11/25.  Per daughter, when he arrived, his O2 sat was in the 80s, but it was better after the infusion, so he was d/c.  Pt was also put on lasix recently for his swollen legs.  He's been taking it.         Home Medications Prior to Admission medications   Medication Sig Start Date End Date Taking? Authorizing Provider  WELIREG 40 MG tablet Take 120 mg by mouth daily.   Yes [provider]  acetaminophen (TYLENOL) 500 MG tablet Take 1,000 mg by mouth as needed for mild pain (pain score 1-3) or moderate pain (pain score 4-6).    [provider]  diazepam (VALIUM) 2 MG tablet TAKE 1-2 TABLETS BY MOUTH EVERY 8 HOURS AS NEEDED FOR ANXIETY 01/09/23   Pickenpack-Cousar, Arty Baumgartner, NP  diltiazem (CARDIZEM CD) 240 MG 24 hr capsule Take 1 capsule (240 mg total) by mouth in the morning. 03/26/22   Croitoru, Mihai, MD  famotidine (PEPCID) 20 MG tablet Take 20 mg by mouth daily. 09/15/22   [provider]  fluticasone (FLONASE) 50 MCG/ACT nasal spray Place 1 spray into both nostrils as needed for allergies. 01/20/22   [provider]  furosemide (LASIX) 20 MG tablet Take 20 mg by mouth daily. 12/26/22   [provider]  hydrocortisone (CORTEF) 20 MG tablet Take 20 mg by mouth in the morning.     [provider]  hydrocortisone (CORTEF) 5 MG tablet Take 1 tablet (5 mg total) by mouth See admin instructions. Daily at 3pm. 12/22/22   Leeroy Bock, MD  levothyroxine (SYNTHROID) 150 MCG tablet Take 1 tablet (150 mcg total) by mouth daily before breakfast. 08/03/22   Carlus Pavlov, MD  loratadine (CLARITIN) 10 MG tablet Take 10 mg by mouth daily.    [provider]  megestrol (MEGACE) 20 MG tablet TAKE 1 TABLET BY MOUTH DAILY 12/26/22   Heilingoetter, Cassandra L, PA-C  metoprolol succinate (TOPROL-XL) 100 MG 24 hr tablet Take 1 tablet (100 mg total) by mouth daily. Take with or immediately following a meal. 12/23/22 01/22/23  Leeroy Bock, MD  Multiple Vitamins-Minerals (CENTRUM SILVER 50+MEN) TABS Take 1 tablet by mouth at bedtime.    [provider]  mupirocin ointment (BACTROBAN) 2 % Place 1 Application into the nose daily as needed (sores).    [provider]  posaconazole (NOXAFIL) 100 MG TBEC delayed-release tablet Take 3 tablets (300 mg total) by mouth daily. 10/22/22   Danelle Earthly, MD  potassium chloride 20 MEQ/15ML (10%) SOLN Take 15 mLs (20 mEq total) by mouth 2 (two) times daily. 01/11/23   Si Gaul, MD  VITAMIN D PO Take 1 capsule by mouth at bedtime.  [provider]      Allergies    Diphenhydramine    Review of Systems   Review of Systems  Respiratory:  Positive for shortness of breath.   All other systems reviewed and are negative.   Physical Exam Updated Vital Signs BP 129/77 (BP Location: Right Arm)   Pulse 96   Temp 97.6 F (36.4 C) (Oral)   Resp 16   Ht 5\' 8"  (1.727 m)   Wt 69 kg   SpO2 100%   BMI 23.13 kg/m  Physical Exam Vitals and nursing note reviewed.  Constitutional:      Appearance: Normal appearance.  HENT:     Head: Normocephalic and atraumatic.     Right Ear: External ear normal.     Left Ear: External ear normal.     Nose: Nose normal.     Mouth/Throat:     Mouth: Mucous  membranes are moist.     Pharynx: Oropharynx is clear.  Eyes:     Extraocular Movements: Extraocular movements intact.     Conjunctiva/sclera: Conjunctivae normal.     Pupils: Pupils are equal, round, and reactive to light.  Cardiovascular:     Rate and Rhythm: Normal rate and regular rhythm.     Pulses: Normal pulses.  Pulmonary:     Effort: Pulmonary effort is normal.     Breath sounds: Normal breath sounds.  Abdominal:     General: Abdomen is flat. Bowel sounds are normal.     Palpations: Abdomen is soft.  Musculoskeletal:     Cervical back: Normal range of motion and neck supple.     Right lower leg: Edema present.     Left lower leg: Edema present.  Skin:    General: Skin is warm.     Capillary Refill: Capillary refill takes less than 2 seconds.  Neurological:     General: No focal deficit present.     Mental Status: He is alert and oriented to person, place, and time.  Psychiatric:        Mood and Affect: Mood normal.        Behavior: Behavior normal.        Thought Content: Thought content normal.     ED Results / Procedures / Treatments   Labs (all labs ordered are listed, but only abnormal results are displayed) Labs Reviewed  BASIC METABOLIC PANEL - Abnormal; Notable for the following components:      Result Value   Potassium <2.0 (*)    Chloride 96 (*)    CO2 34 (*)    Calcium 7.6 (*)    All other components within normal limits  BRAIN NATRIURETIC PEPTIDE - Abnormal; Notable for the following components:   B Natriuretic Peptide 440.3 (*)    All other components within normal limits  CBC WITH DIFFERENTIAL/PLATELET - Abnormal; Notable for the following components:   MCH 24.4 (*)    MCHC 27.3 (*)    RDW 20.0 (*)    All other components within normal limits  D-DIMER, QUANTITATIVE - Abnormal; Notable for the following components:   D-Dimer, Quant 4.25 (*)    All other components within normal limits  HEPATIC FUNCTION PANEL - Abnormal; Notable for the  following components:   Total Protein 5.8 (*)    Albumin 2.2 (*)    AST 14 (*)    Alkaline Phosphatase 164 (*)    All other components within normal limits  TROPONIN I (HIGH SENSITIVITY) - Abnormal; Notable for the  following components:   Troponin I (High Sensitivity) 19 (*)    All other components within normal limits  RESP PANEL BY RT-PCR (RSV, FLU A&B, COVID)  RVPGX2  MAGNESIUM  TSH  TROPONIN I (HIGH SENSITIVITY)    EKG EKG Interpretation Date/Time:  Monday January 18 2023 11:25:42 EST Ventricular Rate:  86 PR Interval:  142 QRS Duration:  93 QT Interval:  352 QTC Calculation: 421 R Axis:   -1  Text Interpretation: Sinus rhythm Ventricular premature complex Anterior infarct, age indeterminate No significant change since last tracing Confirmed by Jacalyn Lefevre [id number]) on 01/18/2023 11:51:48 AM  Radiology CT Angio Chest Pulmonary Embolism (PE) W or WO Contrast  Result Date: 01/18/2023 CLINICAL DATA:  Shortness of breath history of lung cancer EXAM: CT ANGIOGRAPHY CHEST WITH CONTRAST TECHNIQUE: Multidetector CT imaging of the chest was performed using the standard protocol during bolus administration of intravenous contrast. Multiplanar CT image reconstructions and MIPs were obtained to evaluate the vascular anatomy. RADIATION DOSE REDUCTION: This exam was performed according to the departmental dose-optimization program which includes automated exposure control, adjustment of the mA and/or kV according to patient size and/or use of iterative reconstruction technique. CONTRAST:  75mL OMNIPAQUE IOHEXOL 350 MG/ML SOLN COMPARISON:  CT 11/04/2022, 07/13/2022 FINDINGS: Cardiovascular: Satisfactory opacification of the pulmonary arteries to the segmental level. No evidence of pulmonary embolism. Mild aortic atherosclerosis. No aneurysm or dissection. Coronary vascular calcification. Normal cardiac size. No sizable pericardial effusion. Mediastinum/Nodes: Patent trachea. No suspicious  thyroid mass. No suspicious mediastinal lymph nodes. Esophagus within normal limits. Lungs/Pleura: Small right greater than left pleural effusions. Interval mild right pleural thickening compared to prior. Status post partial right lung resection. No acute airspace disease. Slight nodularity along the right posterolateral pleural surface, series 4, image 60 Upper Abdomen: Partially visualized fatty density at the right nephrectomy bed. Small volume ascites in the upper abdomen. Known hepatic metastatic disease, difficult to appreciate likely due to phase of contrast. Incompletely visualized large pancreatic mass and peripancreatic masses. Suspicion of increased porta hepatis adenopathy. Pancreatic ductal dilatation. Musculoskeletal: Mild chronic endplate deformities at T8 and T9. Mild superior endplate deformity at T10, possibly acute to subacute. Mild paravertebral edema at this level. Review of the MIP images confirms the above findings. IMPRESSION: 1. Negative for acute pulmonary embolus. 2. New small right greater than left pleural effusions. Interval mild right pleural thickening compared to prior, with slight nodularity along the right posterolateral pleural surface. Findings raise concern for possible pleural metastatic disease. 3. Incompletely visualized large pancreatic mass and peripancreatic masses. Known hepatic metastatic disease. Suspicion of increased porta hepatis adenopathy. Small volume ascites in the upper abdomen. 4. Mild superior endplate deformity at T10, possibly acute to subacute. Correlate for back pain 5. Aortic atherosclerosis. Aortic Atherosclerosis (ICD10-I70.0). Electronically Signed   By: Jasmine Pang M.D.   On: 01/18/2023 17:21   VAS [place] LOWER EXTREMITY VENOUS (DVT) (7a-7p)  Result Date: 01/18/2023  Lower Venous DVT Study Patient Name:  AAVEN BIGGIO  Date of Exam:   01/18/2023 Medical Rec #: [id number]   Accession #:    [id number] Date of Birth: 1945-12-19   Patient Gender: M Patient  Age:   66 years Exam Location:  [facility] Procedure:      VAS [place] LOWER EXTREMITY VENOUS (DVT) Referring Phys: Arron Tetrault --------------------------------------------------------------------------------  Indications: SOB, Pitting edema., Swelling, and Edema.  Risk Factors: Cancer H/O lung cancer. Comparison Study: No prior exam. Performing Technologist: Fernande Bras  Examination Guidelines: A complete evaluation  includes B-mode imaging, spectral Doppler, color Doppler, and power Doppler as needed of all accessible portions of each vessel. Bilateral testing is considered an integral part of a complete examination. Limited examinations for reoccurring indications may be performed as noted. The reflux portion of the exam is performed with the patient in reverse Trendelenburg.  +---------+---------------+---------+-----------+----------+--------------+ RIGHT    CompressibilityPhasicitySpontaneityPropertiesThrombus Aging +---------+---------------+---------+-----------+----------+--------------+ CFV      Full           Yes      Yes                                 +---------+---------------+---------+-----------+----------+--------------+ SFJ      Full                                                        +---------+---------------+---------+-----------+----------+--------------+ FV Prox  Full                                                        +---------+---------------+---------+-----------+----------+--------------+ FV Mid   Full                                                        +---------+---------------+---------+-----------+----------+--------------+ FV DistalFull                                                        +---------+---------------+---------+-----------+----------+--------------+ PFV      Full                                                        +---------+---------------+---------+-----------+----------+--------------+  POP      Full           Yes      Yes                                 +---------+---------------+---------+-----------+----------+--------------+ PTV      Full                                                        +---------+---------------+---------+-----------+----------+--------------+ PERO     Full                                                        +---------+---------------+---------+-----------+----------+--------------+   +---------+---------------+---------+-----------+----------+--------------+  LEFT     CompressibilityPhasicitySpontaneityPropertiesThrombus Aging +---------+---------------+---------+-----------+----------+--------------+ CFV      Full           Yes      Yes                                 +---------+---------------+---------+-----------+----------+--------------+ SFJ      Full                                                        +---------+---------------+---------+-----------+----------+--------------+ FV Prox  Full                                                        +---------+---------------+---------+-----------+----------+--------------+ FV Mid   Full                                                        +---------+---------------+---------+-----------+----------+--------------+ FV DistalFull                                                        +---------+---------------+---------+-----------+----------+--------------+ PFV      Full                                                        +---------+---------------+---------+-----------+----------+--------------+ POP      Full           Yes      Yes                                 +---------+---------------+---------+-----------+----------+--------------+ PTV      Full                                                        +---------+---------------+---------+-----------+----------+--------------+ PERO     Full                                                         +---------+---------------+---------+-----------+----------+--------------+    Summary: BILATERAL: - No evidence of deep vein thrombosis seen in the lower extremities, bilaterally. -No evidence of popliteal cyst, bilaterally.   *See table(s) above for measurements and observations.    Preliminary    DG Chest [facility] 1 View  Result  Date: 01/18/2023 CLINICAL DATA:  Shortness of breath. EXAM: PORTABLE CHEST 1 VIEW COMPARISON:  12/20/2022. FINDINGS: Redemonstration of elevated right hemidiaphragm. There is blunting of right lateral costophrenic angle, which may represent small right pleural effusion, grossly similar to the prior study. Bilateral lung fields are otherwise clear. Left lateral costophrenic angle is clear. Stable cardio-mediastinal silhouette. No acute osseous abnormalities. The soft tissues are within normal limits. IMPRESSION: *Redemonstration of blunting of right lateral costophrenic angle, which may represent small right pleural effusion. No significant interval change. *No acute cardiopulmonary abnormality. Electronically Signed   By: Jules Schick M.D.   On: 01/18/2023 14:18    Procedures Procedures    Medications Ordered in ED Medications  potassium chloride 10 mEq in 100 mL IVPB (10 mEq Intravenous New Bag/Given 01/18/23 1504)  magnesium sulfate IVPB 2 g 50 mL (2 g Intravenous New Bag/Given 01/18/23 1509)  iohexol (OMNIPAQUE) 350 MG/ML injection 75 mL (75 mLs Intravenous Contrast Given 01/18/23 1513)    ED Course/ Medical Decision Making/ A&P                                 Medical Decision Making Amount and/or Complexity of Data Reviewed Labs: ordered. Radiology: ordered.  Risk Prescription drug management. Decision regarding hospitalization.   This patient presents to the ED for concern of sob, this involves an extensive number of treatment options, and is a complaint that carries with it a high risk of complications and morbidity.  The  differential diagnosis includes chf exac, PE, pna, covid/flu   Co morbidities that complicate the patient evaluation  OA, CKD, hx RCC s/p R nephrectomy, Lung Ca, Pna, CAD, GERD, HLD, HTN, hypothyroidism, paroxysmal afib (Eliquis d/c'd by cards due to frequent falls), and invasive fungal sinusitis   Additional history obtained:  Additional history obtained from epic chart review External records from outside source obtained and reviewed including EMS report   Lab Tests:  I Ordered, and personally interpreted labs.  The pertinent results include:  cbc nl, ddimer elevated at 4.25, bmp with k low at <2, mg nl at 1.8, covid/flu/rsv neg, bnp elevated at 440, trop sl elevated at 19   Imaging Studies ordered:  I ordered imaging studies including cxr and ct chest and [place] I independently visualized and interpreted imaging which showed  CXR: Redemonstration of blunting of right lateral costophrenic angle,  which may represent small right pleural effusion. No significant  interval change.  *No acute cardiopulmonary abnormality.  BLE [place]: BILATERAL:  - No evidence of deep vein thrombosis seen in the lower extremities, bilaterally.  -No evidence of popliteal cyst, bilaterally.  CT chest: Negative for acute pulmonary embolus.  2. New small right greater than left pleural effusions. Interval  mild right pleural thickening compared to prior, with slight  nodularity along the right posterolateral pleural surface. Findings  raise concern for possible pleural metastatic disease.  3. Incompletely visualized large pancreatic mass and peripancreatic  masses. Known hepatic metastatic disease. Suspicion of increased  porta hepatis adenopathy. Small volume ascites in the upper abdomen.  4. Mild superior endplate deformity at T10, possibly acute to  subacute. Correlate for back pain  5. Aortic atherosclerosis.    Aortic Atherosclerosis (ICD10-I70.0).   I agree with the radiologist  interpretation   Cardiac Monitoring:  The patient was maintained on a cardiac monitor.  I personally viewed and interpreted the cardiac monitored which showed an underlying rhythm of: nsr with  pvcs   Medicines ordered and prescription drug management:  I ordered medication including k and mg  for low k  Reevaluation of the patient after these medicines showed that the patient improved I have reviewed the patients home medicines and have made adjustments as needed   Test Considered:  ct   Critical Interventions:  Oxygen, k   Consultations Obtained:  I requested consultation with the hospitalist,  and discussed lab and imaging findings as well as pertinent plan -admission pending at shift change   Problem List / ED Course:  Hypoxia:  RA O2 sat 86%, so pt put on 2L.  O2 up to the mid-90s. Hypokalemia:  IV KCl and IV Mg given. Peripheral edema:  no DVT   Reevaluation:  After the interventions noted above, I reevaluated the patient and found that they have :improved   Social Determinants of Health:  Lives at home   Dispostion:  After consideration of the diagnostic results and the patients response to treatment, I feel that the patent would benefit from admission.  CRITICAL CARE Performed by: Jacalyn Lefevre   Total critical care time: 30 minutes  Critical care time was exclusive of separately billable procedures and treating other patients.  Critical care was necessary to treat or prevent imminent or life-threatening deterioration.  Critical care was time spent personally by me on the following activities: development of treatment plan with patient and/or surrogate as well as nursing, discussions with consultants, evaluation of patient's response to treatment, examination of patient, obtaining history from patient or surrogate, ordering and performing treatments and interventions, ordering and review of laboratory studies, ordering and review of radiographic  studies, pulse oximetry and re-evaluation of patient's condition.           Final Clinical Impression(s) / ED Diagnoses Final diagnoses:  Hypokalemia  Acute respiratory failure with hypoxia (HCC)  FTT (failure to thrive) in adult  Peripheral edema  Metastatic malignant neoplasm, unspecified site [facility])    Rx / DC Orders ED Discharge Orders     None         Jacalyn Lefevre, MD 01/18/23 1737

## 2023-01-18 NOTE — Progress Notes (Signed)
Date and time results received: 01/18/23 2250   Test: Potassium Critical Value: 2.3  Name of Provider Notified: Chinita Greenland NP  Orders Received? Or Actions Taken?:

## 2023-01-18 NOTE — Assessment & Plan Note (Signed)
Had a fall about 4 days ago and is complaining of low back pain, on exam seems to be over L2-L3 area.  No focal tenderness over the site, it does not seem to have been imaged on the CAT scan of the thorax.  Will get an x-ray.  If no finding, patient may benefit from cross-sectional imaging given his diagnosis of an active cancer

## 2023-01-18 NOTE — Assessment & Plan Note (Addendum)
Seems to be nonspecific at this time given the hypoxic respiratory failure.  However I will check a free T4 in the morning to make sure were not missing a marked thyroid insufficiency. Resume 175 mcg of synthroid that paitnet is supposed to be taking at home - concern for patient skipping doses at this time.

## 2023-01-18 NOTE — ED Notes (Signed)
ED TO INPATIENT HANDOFF REPORT  Name/Age/Gender [name] 77 y.o. male  Code Status    Code Status Orders  (From admission, onward)           Start     Ordered   01/18/23 1759  Full code  Continuous       Question:  By:  Answer:  Other   01/18/23 1759           Code Status History     Date Active Date Inactive Code Status Order ID Comments User Context   12/20/2022 1656 12/22/2022 2303 Full Code [id number]  Bobette Mo, MD ED   12/20/2022 1656 12/20/2022 1656 Full Code [id number]  Bobette Mo, MD ED   12/08/2022 1501 12/11/2022 1824 Full Code [id number]  Bobette Mo, MD ED   06/23/2022 1912 06/28/2022 1741 Full Code [id number]  Angie Fava, DO Inpatient   03/26/2021 1021 03/29/2021 1937 Full Code [id number]  Bobette Mo, MD ED   03/24/2018 1720 03/28/2018 1818 Full Code [id number]  Leatha Gilding, MD Inpatient   03/13/2018 2341 03/16/2018 1541 Full Code [id number]  Ike Bene, MD ED   02/01/2015 1411 02/05/2015 1635 Full Code [id number]  [facility] In[facility]       Home/SNF/Other Home  Chief Complaint Pleural effusion [J90]  Level of Care/Admitting Diagnosis ED Disposition     ED Disposition  Admit   Condition  --   Comment  Hospital [facility] [[id number]]  Level of Care: Telemetry [5]  Admit to tele based on following criteria: Acute CHF  May admit patient to [facility] or [facility] if equivalent level of care is available:: No  Covid Evaluation: Asymptomatic - no recent exposure (last 10 days) testing not required  Diagnosis: Pleural effusion [242230]  Admitting Physician: Nolberto Hanlon [[id number]]  Attending Physician: Nolberto Hanlon [[id number]]  Certification:: I certify this patient will need inpatient services for at least 2 midnights  Expected Medical Readiness: 01/21/2023          Medical History Past Medical History:  Diagnosis Date   Arthritis    Chronic kidney disease    only has  one kidney    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 02/01/2015   Coronary artery disease    followed by Dr.Tilley   Frequent PVCs    GERD (gastroesophageal reflux disease)    Heart murmur    never has caused any problems   Hx of cancer of lung 1980's   Hyperlipidemia    Hypertension    Hypothyroidism    Incisional hernia 08/01/2015   Lung cancer (HCC) 1993   Lung metastases 2019   Pneumonia    x several   Recurrent umbilical hernia 08/01/2015    Allergies Allergies  Allergen Reactions   Diphenhydramine Other (See Comments)    Severely restless legs    IV Location/Drains/Wounds Patient Lines/Drains/Airways Status     Active Line/Drains/Airways     Name Placement date Placement time Site Days   Peripheral IV 01/18/23 18 G Left Antecubital 01/18/23  1300  Antecubital  less than 1   Incision - 1 Port  --  --  -- --            Labs/Imaging Results for orders placed or performed during the hospital encounter of 01/18/23 (from the past 48 hour(s))  Basic metabolic panel     Status: Abnormal   Collection Time: 01/18/23 11:33 AM  Result Value Ref Range   Sodium 139 135 - 145 mmol/L   Potassium <2.0 (LL) 3.5 - 5.1 mmol/L    Comment: REPEATED TO VERIFY CRITICAL RESULT CALLED TO, READ BACK BY AND VERIFIED WITH S. SHAW RN 01/18/2023 1312 SL    Chloride 96 (L) 98 - 111 mmol/L   CO2 34 (H) 22 - 32 mmol/L   Glucose, Bld 97 70 - 99 mg/dL    Comment: Glucose reference range applies only to samples taken after fasting for at least 8 hours.   BUN 12 8 - 23 mg/dL   Creatinine, Ser 7.82 0.61 - 1.24 mg/dL   Calcium 7.6 (L) 8.9 - 10.3 mg/dL   GFR, Estimated >95 >62 mL/min    Comment: (NOTE) Calculated using the CKD-EPI Creatinine Equation (2021)    Anion gap 9 5 - 15    Comment: Performed at [facility], [address], [place], [place] [address]  Brain natriuretic peptide     Status: Abnormal   Collection Time: 01/18/23 11:33  AM  Result Value Ref Range   B Natriuretic Peptide 440.3 (H) 0.0 - 100.0 pg/mL    Comment: Performed at [facility], [address] [place], [place] [address]  CBC with Differential     Status: Abnormal   Collection Time: 01/18/23 11:33 AM  Result Value Ref Range   WBC 8.8 4.0 - 10.5 K/uL   RBC 5.37 4.22 - 5.81 MIL/uL   Hemoglobin 13.1 13.0 - 17.0 g/dL   HCT 96.2 95.2 - 84.1 %   MCV 89.2 80.0 - 100.0 fL   MCH 24.4 (L) 26.0 - 34.0 pg   MCHC 27.3 (L) 30.0 - 36.0 g/dL   RDW 32.4 (H) 40.1 - 02.7 %   Platelets 242 150 - 400 K/uL   nRBC 0.0 0.0 - 0.2 %   Neutrophils Relative % 78 %   Neutro Abs 6.9 1.7 - 7.7 K/uL   Lymphocytes Relative 13 %   Lymphs Abs 1.2 0.7 - 4.0 K/uL   Monocytes Relative 7 %   Monocytes Absolute 0.6 0.1 - 1.0 K/uL   Eosinophils Relative 1 %   Eosinophils Absolute 0.1 0.0 - 0.5 K/uL   Basophils Relative 0 %   Basophils Absolute 0.0 0.0 - 0.1 K/uL   Immature Granulocytes 1 %   Abs Immature Granulocytes 0.06 0.00 - 0.07 K/uL    Comment: Performed at [facility], [address], [place], [place] [address]  D-dimer, quantitative     Status: Abnormal   Collection Time: 01/18/23 11:33 AM  Result Value Ref Range   D-Dimer, Quant 4.25 (H) 0.00 - 0.50 ug/mL-FEU    Comment: (NOTE) At the manufacturer cut-off value of 0.5 g/mL FEU, this assay has a negative predictive value of 95-100%.This assay is intended for use in conjunction with a clinical pretest probability (PTP) assessment model to exclude pulmonary embolism (PE) and deep venous thrombosis (DVT) in outpatients suspected of PE or DVT. Results should be correlated with clinical presentation. Performed at [facility], [address] [place], [place] [address]   Troponin I (High Sensitivity)     Status: Abnormal   Collection Time: 01/18/23 11:33 AM  Result Value Ref Range   Troponin I (High Sensitivity) 19 (H) <18 ng/L    Comment: (NOTE) Elevated  high sensitivity troponin I (hsTnI) values and significant  changes across serial measurements may suggest ACS but many other  chronic and acute conditions are known  to elevate hsTnI results.  Refer to the "Links" section for chest pain algorithms and additional  guidance. Performed at [facility], [address], [place], [place] [address]   Magnesium     Status: None   Collection Time: 01/18/23 11:33 AM  Result Value Ref Range   Magnesium 1.8 1.7 - 2.4 mg/dL    Comment: Performed at [facility], [address] [place], [place] [address]  Hepatic function panel     Status: Abnormal   Collection Time: 01/18/23 11:33 AM  Result Value Ref Range   Total Protein 5.8 (L) 6.5 - 8.1 g/dL   Albumin 2.2 (L) 3.5 - 5.0 g/dL   AST 14 (L) 15 - 41 U/L   ALT 12 0 - 44 U/L   Alkaline Phosphatase 164 (H) 38 - 126 U/L   Total Bilirubin 0.4 <1.2 mg/dL   Bilirubin, Direct 0.1 0.0 - 0.2 mg/dL   Indirect Bilirubin 0.3 0.3 - 0.9 mg/dL    Comment: Performed at [facility], [address] [place], [place] [address]  Resp panel by RT-PCR (RSV, Flu A&B, Covid) Anterior Nasal Swab     Status: None   Collection Time: 01/18/23 11:36 AM   Specimen: Anterior Nasal Swab  Result Value Ref Range   SARS Coronavirus 2 by RT PCR NEGATIVE NEGATIVE    Comment: (NOTE) SARS-CoV-2 target nucleic acids are NOT DETECTED.  The SARS-CoV-2 RNA is generally detectable in upper respiratory specimens during the acute phase of infection. The lowest concentration of SARS-CoV-2 viral copies this assay can detect is 138 copies/mL. A negative result does not preclude SARS-Cov-2 infection and should not be used as the sole basis for treatment or other patient management decisions. A negative result may occur with  improper specimen collection/handling, submission of specimen other than nasopharyngeal swab, presence of viral mutation(s) within the areas targeted by this  assay, and inadequate number of viral copies(<138 copies/mL). A negative result must be combined with clinical observations, patient history, and epidemiological information. The expected result is Negative.  Fact Sheet for Patients:  BloggerCourse.com  Fact Sheet for Healthcare Providers:  SeriousBroker.it  This test is no t yet approved or cleared by the [place] FDA and  has been authorized for detection and/or diagnosis of SARS-CoV-2 by FDA under an Emergency Use Authorization (EUA). This EUA will remain  in effect (meaning this test can be used) for the duration of the COVID-19 declaration under Section 564(b)(1) of the Act, 21 U.S.C.section 360bbb-3(b)(1), unless the authorization is terminated  or revoked sooner.       Influenza A by PCR NEGATIVE NEGATIVE   Influenza B by PCR NEGATIVE NEGATIVE    Comment: (NOTE) The Xpert Xpress SARS-CoV-2/FLU/RSV plus assay is intended as an aid in the diagnosis of influenza from Nasopharyngeal swab specimens and should not be used as a sole basis for treatment. Nasal washings and aspirates are unacceptable for Xpert Xpress SARS-CoV-2/FLU/RSV testing.  Fact Sheet for Patients: BloggerCourse.com  Fact Sheet for Healthcare Providers: SeriousBroker.it  This test is not yet approved or cleared by the [place] FDA and has been authorized for detection and/or diagnosis of SARS-CoV-2 by FDA under an Emergency Use Authorization (EUA). This EUA will remain in effect (meaning this test can be used) for the duration of the COVID-19 declaration under Section 564(b)(1) of the Act, 21 U.S.C. section 360bbb-3(b)(1), unless the authorization is terminated or revoked.     Resp Syncytial Virus by PCR NEGATIVE  NEGATIVE    Comment: (NOTE) Fact Sheet for Patients: BloggerCourse.com  Fact Sheet for Healthcare  Providers: SeriousBroker.it  This test is not yet approved or cleared by the [place] FDA and has been authorized for detection and/or diagnosis of SARS-CoV-2 by FDA under an Emergency Use Authorization (EUA). This EUA will remain in effect (meaning this test can be used) for the duration of the COVID-19 declaration under Section 564(b)(1) of the Act, 21 U.S.C. section 360bbb-3(b)(1), unless the authorization is terminated or revoked.  Performed at [facility], [address], [place], [place] [address]   Troponin I (High Sensitivity)     Status: Abnormal   Collection Time: 01/18/23  6:07 PM  Result Value Ref Range   Troponin I (High Sensitivity) 20 (H) <18 ng/L    Comment: (NOTE) Elevated high sensitivity troponin I (hsTnI) values and significant  changes across serial measurements may suggest ACS but many other  chronic and acute conditions are known to elevate hsTnI results.  Refer to the "Links" section for chest pain algorithms and additional  guidance. Performed at [facility] [address] [place], [place] [address]    CT Angio Chest Pulmonary Embolism (PE) W or WO Contrast  Result Date: 01/18/2023 CLINICAL DATA:  Shortness of breath history of lung cancer EXAM: CT ANGIOGRAPHY CHEST WITH CONTRAST TECHNIQUE: Multidetector CT imaging of the chest was performed using the standard protocol during bolus administration of intravenous contrast. Multiplanar CT image reconstructions and MIPs were obtained to evaluate the vascular anatomy. RADIATION DOSE REDUCTION: This exam was performed according to the departmental dose-optimization program which includes automated exposure control, adjustment of the mA and/or kV according to patient size and/or use of iterative reconstruction technique. CONTRAST:  75mL OMNIPAQUE IOHEXOL 350 MG/ML SOLN COMPARISON:  CT 11/04/2022, 07/13/2022 FINDINGS: Cardiovascular: Satisfactory  opacification of the pulmonary arteries to the segmental level. No evidence of pulmonary embolism. Mild aortic atherosclerosis. No aneurysm or dissection. Coronary vascular calcification. Normal cardiac size. No sizable pericardial effusion. Mediastinum/Nodes: Patent trachea. No suspicious thyroid mass. No suspicious mediastinal lymph nodes. Esophagus within normal limits. Lungs/Pleura: Small right greater than left pleural effusions. Interval mild right pleural thickening compared to prior. Status post partial right lung resection. No acute airspace disease. Slight nodularity along the right posterolateral pleural surface, series 4, image 60 Upper Abdomen: Partially visualized fatty density at the right nephrectomy bed. Small volume ascites in the upper abdomen. Known hepatic metastatic disease, difficult to appreciate likely due to phase of contrast. Incompletely visualized large pancreatic mass and peripancreatic masses. Suspicion of increased porta hepatis adenopathy. Pancreatic ductal dilatation. Musculoskeletal: Mild chronic endplate deformities at T8 and T9. Mild superior endplate deformity at T10, possibly acute to subacute. Mild paravertebral edema at this level. Review of the MIP images confirms the above findings. IMPRESSION: 1. Negative for acute pulmonary embolus. 2. New small right greater than left pleural effusions. Interval mild right pleural thickening compared to prior, with slight nodularity along the right posterolateral pleural surface. Findings raise concern for possible pleural metastatic disease. 3. Incompletely visualized large pancreatic mass and peripancreatic masses. Known hepatic metastatic disease. Suspicion of increased porta hepatis adenopathy. Small volume ascites in the upper abdomen. 4. Mild superior endplate deformity at T10, possibly acute to subacute. Correlate for back pain 5. Aortic atherosclerosis. Aortic Atherosclerosis (ICD10-I70.0). Electronically Signed   By: Jasmine Pang M.D.   On: 01/18/2023 17:21   VAS [place] LOWER EXTREMITY VENOUS (DVT) (7a-7p)  Result Date: 01/18/2023  Lower  Venous DVT Study Patient Name:  [name]  Date of Exam:   01/18/2023 Medical Rec #: [id number]   Accession #:    [id number] Date of Birth: 18-Apr-1945   Patient Gender: M Patient Age:   17 years Exam Location[facility] Procedure:      VAS [place] LOWER EXTREMITY VENOUS (DVT) Referring Phys: JULIE HAVILAND --------------------------------------------------------------------------------  Indications: SOB, Pitting edema., Swelling, and Edema.  Risk Factors: Cancer H/O lung cancer. Comparison Study: No prior exam. Performing Technologist: Fernande Bras  Examination Guidelines: A complete evaluation includes B-mode imaging, spectral Doppler, color Doppler, and power Doppler as needed of all accessible portions of each vessel. Bilateral testing is considered an integral part of a complete examination. Limited examinations for reoccurring indications may be performed as noted. The reflux portion of the exam is performed with the patient in reverse Trendelenburg.  +---------+---------------+---------+-----------+----------+--------------+ RIGHT    CompressibilityPhasicitySpontaneityPropertiesThrombus Aging +---------+---------------+---------+-----------+----------+--------------+ CFV      Full           Yes      Yes                                 +---------+---------------+---------+-----------+----------+--------------+ SFJ      Full                                                        +---------+---------------+---------+-----------+----------+--------------+ FV Prox  Full                                                        +---------+---------------+---------+-----------+----------+--------------+ FV Mid   Full                                                        +---------+---------------+---------+-----------+----------+--------------+ FV  DistalFull                                                        +---------+---------------+---------+-----------+----------+--------------+ PFV      Full                                                        +---------+---------------+---------+-----------+----------+--------------+ POP      Full           Yes      Yes                                 +---------+---------------+---------+-----------+----------+--------------+ PTV      Full                                                        +---------+---------------+---------+-----------+----------+--------------+  PERO     Full                                                        +---------+---------------+---------+-----------+----------+--------------+   +---------+---------------+---------+-----------+----------+--------------+ LEFT     CompressibilityPhasicitySpontaneityPropertiesThrombus Aging +---------+---------------+---------+-----------+----------+--------------+ CFV      Full           Yes      Yes                                 +---------+---------------+---------+-----------+----------+--------------+ SFJ      Full                                                        +---------+---------------+---------+-----------+----------+--------------+ FV Prox  Full                                                        +---------+---------------+---------+-----------+----------+--------------+ FV Mid   Full                                                        +---------+---------------+---------+-----------+----------+--------------+ FV DistalFull                                                        +---------+---------------+---------+-----------+----------+--------------+ PFV      Full                                                        +---------+---------------+---------+-----------+----------+--------------+ POP      Full           Yes      Yes                                  +---------+---------------+---------+-----------+----------+--------------+ PTV      Full                                                        +---------+---------------+---------+-----------+----------+--------------+ PERO     Full                                                        +---------+---------------+---------+-----------+----------+--------------+  Summary: BILATERAL: - No evidence of deep vein thrombosis seen in the lower extremities, bilaterally. -No evidence of popliteal cyst, bilaterally.   *See table(s) above for measurements and observations.    Preliminary    DG Chest Port 1 View  Result Date: 01/18/2023 CLINICAL DATA:  Shortness of breath. EXAM: PORTABLE CHEST 1 VIEW COMPARISON:  12/20/2022. FINDINGS: Redemonstration of elevated right hemidiaphragm. There is blunting of right lateral costophrenic angle, which may represent small right pleural effusion, grossly similar to the prior study. Bilateral lung fields are otherwise clear. Left lateral costophrenic angle is clear. Stable cardio-mediastinal silhouette. No acute osseous abnormalities. The soft tissues are within normal limits. IMPRESSION: *Redemonstration of blunting of right lateral costophrenic angle, which may represent small right pleural effusion. No significant interval change. *No acute cardiopulmonary abnormality. Electronically Signed   By: Jules Schick M.D.   On: 01/18/2023 14:18    Pending Labs Unresulted Labs (From admission, onward)     Start     Ordered   01/25/23 0500  Creatinine, serum  (enoxaparin (LOVENOX)    CrCl >/= 30 ml/min)  Weekly,   R     Comments: while on enoxaparin therapy    01/18/23 1759   01/19/23 0500  APTT  Tomorrow morning,   R        01/18/23 1759   01/19/23 0500  Protime-INR  Tomorrow morning,   R        01/18/23 1759   01/19/23 0500  Basic metabolic panel  Tomorrow morning,   R        01/18/23 1759   01/19/23 0500  CBC  Tomorrow morning,    R        01/18/23 1759   01/18/23 1801  Procalcitonin  Once,   R       References:    Procalcitonin Lower Respiratory Tract Infection AND Sepsis Procalcitonin Algorithm   01/18/23 1800   01/18/23 1800  Culture, blood (Routine X 2) w Reflex to ID Panel  BLOOD CULTURE X 2,   R (with TIMED occurrences)      01/18/23 1800   01/18/23 1759  CBC  (enoxaparin (LOVENOX)    CrCl >/= 30 ml/min)  Once,   R       Comments: Baseline for enoxaparin therapy IF NOT ALREADY DRAWN.  Notify MD if PLT < 100 K.    01/18/23 1759   01/18/23 1759  Creatinine, serum  (enoxaparin (LOVENOX)    CrCl >/= 30 ml/min)  Once,   R       Comments: Baseline for enoxaparin therapy IF NOT ALREADY DRAWN.    01/18/23 1759   01/18/23 1132  TSH  Once,   URGENT        01/18/23 1131            Vitals/Pain Today's Vitals   01/18/23 1129 01/18/23 1150 01/18/23 1430 01/18/23 1500  BP:   129/77 (!) 140/57  Pulse:   96 94  Resp:   16 17  Temp: 97.6 F (36.4 C)   97.9 F (36.6 C)  TempSrc: Oral   Oral  SpO2:   100% 100%  Weight:  69 kg    Height:  5\' 8"  (1.727 m)    PainSc:  0-No pain      Isolation Precautions No active isolations  Medications Medications  potassium chloride 10 mEq in 100 mL IVPB (0 mEq Intravenous Stopped 01/18/23 1901)  enoxaparin (LOVENOX) injection 40 mg (has no administration in time range)  acetaminophen (TYLENOL) tablet 650 mg (has no administration in time range)    Or  acetaminophen (TYLENOL) suppository 650 mg (has no administration in time range)  polyethylene glycol (MIRALAX / GLYCOLAX) packet 17 g (has no administration in time range)  sodium chloride flush (NS) 0.9 % injection 3 mL (has no administration in time range)  Ampicillin-Sulbactam (UNASYN) 3 g in sodium chloride 0.9 % 100 mL IVPB (has no administration in time range)  magnesium sulfate IVPB 2 g 50 mL (0 g Intravenous Stopped 01/18/23 1509)  iohexol (OMNIPAQUE) 350 MG/ML injection 75 mL (75 mLs Intravenous Contrast Given  01/18/23 1513)    Mobility walks with person assist

## 2023-01-18 NOTE — ED Triage Notes (Signed)
Pt arrived with EMS. Reports was sent from home health nurse for shob. Hx of lung Lung Ca. On arrival to residence pt was 85 percent on room air. On [place] 2L patient went up to 100 percent. NAD on arrival to Ed. Reports no pain or shob at this time.  Patient note to have ble lowe extremity swelling

## 2023-01-18 NOTE — Assessment & Plan Note (Addendum)
Severe, basically undetectable on our assay today.  Likely due to Lasix use at home.  Therefore patient received 40 mEq of IV KCl.  Patient creatinine is at baseline.  I am going to go ahead and give him 60 mEq KCl right now, patient is pending a BMP to recheck this level. Keep on telemetry. Mg is wnl

## 2023-01-18 NOTE — ED Notes (Signed)
Family to desk to ask if pt could have ice chips. Advised her that pt would have to wait until the reults from the scans are back before he can have anything. Family member stated "Oh for godsake this is absolutely ridiculous". Advised family member that anything by mouth would make them a high risk for aspiration, she walked away mid sentence and made s shooing motion to this RN before slamming the door.

## 2023-01-18 NOTE — Assessment & Plan Note (Addendum)
This is chronically documented in chart.  Her discharge summary on November 5, patient is supposed to be on 20 mg of hydrocortisone in the morning and 5 mg at 3 PM.  However patient is currently using 15 and 5 mg respectively in the morning and at afternoon times.  I will continue patient's medication as he is taking at home.  In case of any vital instability, administer stress dose steroids.

## 2023-01-18 NOTE — Assessment & Plan Note (Addendum)
Patient on belzutifan as outpatient

## 2023-01-18 NOTE — ED Notes (Signed)
ED TO INPATIENT HANDOFF REPORT  Name/Age/Gender [name] 77 y.o. male  Code Status    Code Status Orders  (From admission, onward)           Start     Ordered   01/18/23 1759  Full code  Continuous       Question:  By:  Answer:  Other   01/18/23 1759           Code Status History     Date Active Date Inactive Code Status Order ID Comments User Context   12/20/2022 1656 12/22/2022 2303 Full Code [id number]  Bobette Mo, MD ED   12/20/2022 1656 12/20/2022 1656 Full Code [id number]  Bobette Mo, MD ED   12/08/2022 1501 12/11/2022 1824 Full Code [id number]  Bobette Mo, MD ED   06/23/2022 1912 06/28/2022 1741 Full Code [id number]  Angie Fava, DO Inpatient   03/26/2021 1021 03/29/2021 1937 Full Code [id number]  Bobette Mo, MD ED   03/24/2018 1720 03/28/2018 1818 Full Code [id number]  Leatha Gilding, MD Inpatient   03/13/2018 2341 03/16/2018 1541 Full Code [id number]  Ike Bene, MD ED   02/01/2015 1411 02/05/2015 1635 Full Code [id number]  [facility] In[facility]       Home/SNF/Other Home  Chief Complaint Pleural effusion [J90]  Level of Care/Admitting Diagnosis ED Disposition     ED Disposition  Admit   Condition  --   Comment  Hospital Area: [facility] [[id number]]  Level of Care: Telemetry [5]  Admit to tele based on following criteria: Acute CHF  May admit patient to [facility] or [facility] if equivalent level of care is available:: No  Covid Evaluation: Asymptomatic - no recent exposure (last 10 days) testing not required  Diagnosis: Pleural effusion [242230]  Admitting Physician: Nolberto Hanlon [[id number]]  Attending Physician: Nolberto Hanlon [[id number]]  Certification:: I certify this patient will need inpatient services for at least 2 midnights  Expected Medical Readiness: 01/21/2023          Medical History Past Medical History:  Diagnosis Date   Arthritis    Chronic kidney disease    only has  one kidney    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 02/01/2015   Coronary artery disease    followed by Dr.Tilley   Frequent PVCs    GERD (gastroesophageal reflux disease)    Heart murmur    never has caused any problems   Hx of cancer of lung 1980's   Hyperlipidemia    Hypertension    Hypothyroidism    Incisional hernia 08/01/2015   Lung cancer (HCC) 1993   Lung metastases 2019   Pneumonia    x several   Recurrent umbilical hernia 08/01/2015    Allergies Allergies  Allergen Reactions   Diphenhydramine Other (See Comments)    Severely restless legs    IV Location/Drains/Wounds Patient Lines/Drains/Airways Status     Active Line/Drains/Airways     Name Placement date Placement time Site Days   Peripheral IV 01/18/23 18 G Left Antecubital 01/18/23  1300  Antecubital  less than 1   Incision - 1 Port  --  --  -- --            Labs/Imaging Results for orders placed or performed during the hospital encounter of 01/18/23 (from the past 48 hour(s))  Basic metabolic panel     Status: Abnormal   Collection Time: 01/18/23 11:33 AM  Result Value Ref Range   Sodium 139 135 - 145 mmol/L   Potassium <2.0 (LL) 3.5 - 5.1 mmol/L    Comment: REPEATED TO VERIFY CRITICAL RESULT CALLED TO, READ BACK BY AND VERIFIED WITH S. SHAW RN 01/18/2023 1312 SL    Chloride 96 (L) 98 - 111 mmol/L   CO2 34 (H) 22 - 32 mmol/L   Glucose, Bld 97 70 - 99 mg/dL    Comment: Glucose reference range applies only to samples taken after fasting for at least 8 hours.   BUN 12 8 - 23 mg/dL   Creatinine, Ser 1.61 0.61 - 1.24 mg/dL   Calcium 7.6 (L) 8.9 - 10.3 mg/dL   GFR, Estimated >09 >60 mL/min    Comment: (NOTE) Calculated using the CKD-EPI Creatinine Equation (2021)    Anion gap 9 5 - 15    Comment: Performed at [facility], [address], [place], [place] [address]  Brain natriuretic peptide     Status: Abnormal   Collection Time: 01/18/23 11:33  AM  Result Value Ref Range   B Natriuretic Peptide 440.3 (H) 0.0 - 100.0 pg/mL    Comment: Performed at [facility], [address] [place], [place] [address]  CBC with Differential     Status: Abnormal   Collection Time: 01/18/23 11:33 AM  Result Value Ref Range   WBC 8.8 4.0 - 10.5 K/uL   RBC 5.37 4.22 - 5.81 MIL/uL   Hemoglobin 13.1 13.0 - 17.0 g/dL   HCT 47.8 29.5 - 62.1 %   MCV 89.2 80.0 - 100.0 fL   MCH 24.4 (L) 26.0 - 34.0 pg   MCHC 27.3 (L) 30.0 - 36.0 g/dL   RDW 30.8 (H) 65.7 - 84.6 %   Platelets 242 150 - 400 K/uL   nRBC 0.0 0.0 - 0.2 %   Neutrophils Relative % 78 %   Neutro Abs 6.9 1.7 - 7.7 K/uL   Lymphocytes Relative 13 %   Lymphs Abs 1.2 0.7 - 4.0 K/uL   Monocytes Relative 7 %   Monocytes Absolute 0.6 0.1 - 1.0 K/uL   Eosinophils Relative 1 %   Eosinophils Absolute 0.1 0.0 - 0.5 K/uL   Basophils Relative 0 %   Basophils Absolute 0.0 0.0 - 0.1 K/uL   Immature Granulocytes 1 %   Abs Immature Granulocytes 0.06 0.00 - 0.07 K/uL    Comment: Performed at [facility], [address] [place], [place] [address]  D-dimer, quantitative     Status: Abnormal   Collection Time: 01/18/23 11:33 AM  Result Value Ref Range   D-Dimer, Quant 4.25 (H) 0.00 - 0.50 ug/mL-FEU    Comment: (NOTE) At the manufacturer cut-off value of 0.5 g/mL FEU, this assay has a negative predictive value of 95-100%.This assay is intended for use in conjunction with a clinical pretest probability (PTP) assessment model to exclude pulmonary embolism (PE) and deep venous thrombosis (DVT) in outpatients suspected of PE or DVT. Results should be correlated with clinical presentation. Performed at [facility], [address] [place], [place] [address]   Troponin I (High Sensitivity)     Status: Abnormal   Collection Time: 01/18/23 11:33 AM  Result Value Ref Range   Troponin I (High Sensitivity) 19 (H) <18 ng/L    Comment: (NOTE) Elevated  high sensitivity troponin I (hsTnI) values and significant  changes across serial measurements may suggest ACS but many other  chronic and acute conditions are known  to elevate hsTnI results.  Refer to the "Links" section for chest pain algorithms and additional  guidance. Performed at [facility], [address], [place], [place] [address]   Magnesium     Status: None   Collection Time: 01/18/23 11:33 AM  Result Value Ref Range   Magnesium 1.8 1.7 - 2.4 mg/dL    Comment: Performed at [facility], [address], [place], [place] [address]  Hepatic function panel     Status: Abnormal   Collection Time: 01/18/23 11:33 AM  Result Value Ref Range   Total Protein 5.8 (L) 6.5 - 8.1 g/dL   Albumin 2.2 (L) 3.5 - 5.0 g/dL   AST 14 (L) 15 - 41 U/L   ALT 12 0 - 44 U/L   Alkaline Phosphatase 164 (H) 38 - 126 U/L   Total Bilirubin 0.4 <1.2 mg/dL   Bilirubin, Direct 0.1 0.0 - 0.2 mg/dL   Indirect Bilirubin 0.3 0.3 - 0.9 mg/dL    Comment: Performed at [facility], [address], [place], [place] [address]  Resp panel by RT-PCR (RSV, Flu A&B, Covid) Anterior Nasal Swab     Status: None   Collection Time: 01/18/23 11:36 AM   Specimen: Anterior Nasal Swab  Result Value Ref Range   SARS Coronavirus 2 by RT PCR NEGATIVE NEGATIVE    Comment: (NOTE) SARS-CoV-2 target nucleic acids are NOT DETECTED.  The SARS-CoV-2 RNA is generally detectable in upper respiratory specimens during the acute phase of infection. The lowest concentration of SARS-CoV-2 viral copies this assay can detect is 138 copies/mL. A negative result does not preclude SARS-Cov-2 infection and should not be used as the sole basis for treatment or other patient management decisions. A negative result may occur with  improper specimen collection/handling, submission of specimen other than nasopharyngeal swab, presence of viral mutation(s) within the areas targeted by this  assay, and inadequate number of viral copies(<138 copies/mL). A negative result must be combined with clinical observations, patient history, and epidemiological information. The expected result is Negative.  Fact Sheet for Patients:  BloggerCourse.com  Fact Sheet for Healthcare Providers:  SeriousBroker.it  This test is no t yet approved or cleared by the [place] FDA and  has been authorized for detection and/or diagnosis of SARS-CoV-2 by FDA under an Emergency Use Authorization (EUA). This EUA will remain  in effect (meaning this test can be used) for the duration of the COVID-19 declaration under Section 564(b)(1) of the Act, 21 U.S.C.section 360bbb-3(b)(1), unless the authorization is terminated  or revoked sooner.       Influenza A by PCR NEGATIVE NEGATIVE   Influenza B by PCR NEGATIVE NEGATIVE    Comment: (NOTE) The Xpert Xpress SARS-CoV-2/FLU/RSV plus assay is intended as an aid in the diagnosis of influenza from Nasopharyngeal swab specimens and should not be used as a sole basis for treatment. Nasal washings and aspirates are unacceptable for Xpert Xpress SARS-CoV-2/FLU/RSV testing.  Fact Sheet for Patients: BloggerCourse.com  Fact Sheet for Healthcare Providers: SeriousBroker.it  This test is not yet approved or cleared by the [place] FDA and has been authorized for detection and/or diagnosis of SARS-CoV-2 by FDA under an Emergency Use Authorization (EUA). This EUA will remain in effect (meaning this test can be used) for the duration of the COVID-19 declaration under Section 564(b)(1) of the Act, 21 U.S.C. section 360bbb-3(b)(1), unless the authorization is terminated or revoked.     Resp Syncytial Virus by PCR NEGATIVE  NEGATIVE    Comment: (NOTE) Fact Sheet for Patients: BloggerCourse.com  Fact Sheet for Healthcare  Providers: SeriousBroker.it  This test is not yet approved or cleared by the [place] FDA and has been authorized for detection and/or diagnosis of SARS-CoV-2 by FDA under an Emergency Use Authorization (EUA). This EUA will remain in effect (meaning this test can be used) for the duration of the COVID-19 declaration under Section 564(b)(1) of the Act, 21 U.S.C. section 360bbb-3(b)(1), unless the authorization is terminated or revoked.  Performed at [facility], [address], [place], [place] [address]   TSH     Status: Abnormal   Collection Time: 01/18/23  6:07 PM  Result Value Ref Range   TSH 8.917 (H) 0.350 - 4.500 uIU/mL    Comment: Performed by a 3rd Generation assay with a functional sensitivity of <=0.01 uIU/mL. Performed at [facility], [address], [place], [place] [address]   Troponin I (High Sensitivity)     Status: Abnormal   Collection Time: 01/18/23  6:07 PM  Result Value Ref Range   Troponin I (High Sensitivity) 20 (H) <18 ng/L    Comment: (NOTE) Elevated high sensitivity troponin I (hsTnI) values and significant  changes across serial measurements may suggest ACS but many other  chronic and acute conditions are known to elevate hsTnI results.  Refer to the "Links" section for chest pain algorithms and additional  guidance. Performed at [facility], [address], [place], [place] [address]   Procalcitonin     Status: None   Collection Time: 01/18/23  6:07 PM  Result Value Ref Range   Procalcitonin 0.95 ng/mL    Comment:        Interpretation: PCT > 0.5 ng/mL and <= 2 ng/mL: Systemic infection (sepsis) is possible, but other conditions are known to elevate PCT as well. (NOTE)       Sepsis PCT Algorithm           Lower Respiratory Tract                                      Infection PCT Algorithm    ----------------------------     ----------------------------          PCT < 0.25 ng/mL                PCT < 0.10 ng/mL          Strongly encourage             Strongly discourage   discontinuation of antibiotics    initiation of antibiotics    ----------------------------     -----------------------------       PCT 0.25 - 0.50 ng/mL            PCT 0.10 - 0.25 ng/mL               OR       >80% decrease in PCT            Discourage initiation of                                            antibiotics      Encourage discontinuation           of antibiotics    ----------------------------     -----------------------------  PCT >= 0.50 ng/mL              PCT 0.26 - 0.50 ng/mL                AND       <80% decrease in PCT             Encourage initiation of                                             antibiotics       Encourage continuation           of antibiotics    ----------------------------     -----------------------------        PCT >= 0.50 ng/mL                  PCT > 0.50 ng/mL               AND         increase in PCT                  Strongly encourage                                      initiation of antibiotics    Strongly encourage escalation           of antibiotics                                     -----------------------------                                           PCT <= 0.25 ng/mL                                                 OR                                        > 80% decrease in PCT                                      Discontinue / Do not initiate                                             antibiotics  Performed at [facility], [address] [place], [place] [address]   Creatinine, serum     Status: None   Collection Time: 01/18/23  8:20 PM  Result Value Ref Range   Creatinine, Ser 0.73 0.61 - 1.24 mg/dL   GFR, Estimated >27 >03 mL/min    Comment: (NOTE) Calculated using the CKD-EPI Creatinine Equation (2021)  Performed at [facility], [address] [place], [place] [address]    CT Angio Chest Pulmonary Embolism (PE) W or WO Contrast  Result Date: 01/18/2023 CLINICAL DATA:  Shortness of breath history of lung cancer EXAM: CT ANGIOGRAPHY CHEST WITH CONTRAST TECHNIQUE: Multidetector CT imaging of the chest was performed using the standard protocol during bolus administration of intravenous contrast. Multiplanar CT image reconstructions and MIPs were obtained to evaluate the vascular anatomy. RADIATION DOSE REDUCTION: This exam was performed according to the departmental dose-optimization program which includes automated exposure control, adjustment of the mA and/or kV according to patient size and/or use of iterative reconstruction technique. CONTRAST:  75mL OMNIPAQUE IOHEXOL 350 MG/ML SOLN COMPARISON:  CT 11/04/2022, 07/13/2022 FINDINGS: Cardiovascular: Satisfactory opacification of the pulmonary arteries to the segmental level. No evidence of pulmonary embolism. Mild aortic atherosclerosis. No aneurysm or dissection. Coronary vascular calcification. Normal cardiac size. No sizable pericardial effusion. Mediastinum/Nodes: Patent trachea. No suspicious thyroid mass. No suspicious mediastinal lymph nodes. Esophagus within normal limits. Lungs/Pleura: Small right greater than left pleural effusions. Interval mild right pleural thickening compared to prior. Status post partial right lung resection. No acute airspace disease. Slight nodularity along the right posterolateral pleural surface, series 4, image 60 Upper Abdomen: Partially visualized fatty density at the right nephrectomy bed. Small volume ascites in the upper abdomen. Known hepatic metastatic disease, difficult to appreciate likely due to phase of contrast. Incompletely visualized large pancreatic mass and peripancreatic masses. Suspicion of increased porta hepatis adenopathy. Pancreatic ductal dilatation. Musculoskeletal: Mild chronic endplate deformities at T8 and T9. Mild superior endplate  deformity at T10, possibly acute to subacute. Mild paravertebral edema at this level. Review of the MIP images confirms the above findings. IMPRESSION: 1. Negative for acute pulmonary embolus. 2. New small right greater than left pleural effusions. Interval mild right pleural thickening compared to prior, with slight nodularity along the right posterolateral pleural surface. Findings raise concern for possible pleural metastatic disease. 3. Incompletely visualized large pancreatic mass and peripancreatic masses. Known hepatic metastatic disease. Suspicion of increased porta hepatis adenopathy. Small volume ascites in the upper abdomen. 4. Mild superior endplate deformity at T10, possibly acute to subacute. Correlate for back pain 5. Aortic atherosclerosis. Aortic Atherosclerosis (ICD10-I70.0). Electronically Signed   By: Jasmine Pang M.D.   On: 01/18/2023 17:21   VAS [place] LOWER EXTREMITY VENOUS (DVT) (7a-7p)  Result Date: 01/18/2023  Lower Venous DVT Study Patient Name:  [name]  Date of Exam:   01/18/2023 Medical Rec #: [id number]   Accession #:    [id number] Date of Birth: May 18, 1945   Patient Gender: M Patient Age:   22 years Exam Location:  [facility] Procedure:      VAS [place] LOWER EXTREMITY VENOUS (DVT) Referring Phys: JULIE HAVILAND --------------------------------------------------------------------------------  Indications: SOB, Pitting edema., Swelling, and Edema.  Risk Factors: Cancer H/O lung cancer. Comparison Study: No prior exam. Performing Technologist: Fernande Bras  Examination Guidelines: A complete evaluation includes B-mode imaging, spectral Doppler, color Doppler, and power Doppler as needed of all accessible portions of each vessel. Bilateral testing is considered an integral part of a complete examination. Limited examinations for reoccurring indications may be performed as noted. The reflux portion of the exam is performed with the patient in reverse Trendelenburg.   +---------+---------------+---------+-----------+----------+--------------+ RIGHT    CompressibilityPhasicitySpontaneityPropertiesThrombus Aging +---------+---------------+---------+-----------+----------+--------------+ CFV      Full           Yes      Yes                                 +---------+---------------+---------+-----------+----------+--------------+  SFJ      Full                                                        +---------+---------------+---------+-----------+----------+--------------+ FV Prox  Full                                                        +---------+---------------+---------+-----------+----------+--------------+ FV Mid   Full                                                        +---------+---------------+---------+-----------+----------+--------------+ FV DistalFull                                                        +---------+---------------+---------+-----------+----------+--------------+ PFV      Full                                                        +---------+---------------+---------+-----------+----------+--------------+ POP      Full           Yes      Yes                                 +---------+---------------+---------+-----------+----------+--------------+ PTV      Full                                                        +---------+---------------+---------+-----------+----------+--------------+ PERO     Full                                                        +---------+---------------+---------+-----------+----------+--------------+   +---------+---------------+---------+-----------+----------+--------------+ LEFT     CompressibilityPhasicitySpontaneityPropertiesThrombus Aging +---------+---------------+---------+-----------+----------+--------------+ CFV      Full           Yes      Yes                                  +---------+---------------+---------+-----------+----------+--------------+ SFJ      Full                                                        +---------+---------------+---------+-----------+----------+--------------+  FV Prox  Full                                                        +---------+---------------+---------+-----------+----------+--------------+ FV Mid   Full                                                        +---------+---------------+---------+-----------+----------+--------------+ FV DistalFull                                                        +---------+---------------+---------+-----------+----------+--------------+ PFV      Full                                                        +---------+---------------+---------+-----------+----------+--------------+ POP      Full           Yes      Yes                                 +---------+---------------+---------+-----------+----------+--------------+ PTV      Full                                                        +---------+---------------+---------+-----------+----------+--------------+ PERO     Full                                                        +---------+---------------+---------+-----------+----------+--------------+    Summary: BILATERAL: - No evidence of deep vein thrombosis seen in the lower extremities, bilaterally. -No evidence of popliteal cyst, bilaterally.   *See table(s) above for measurements and observations.    Preliminary    DG Chest Port 1 View  Result Date: 01/18/2023 CLINICAL DATA:  Shortness of breath. EXAM: PORTABLE CHEST 1 VIEW COMPARISON:  12/20/2022. FINDINGS: Redemonstration of elevated right hemidiaphragm. There is blunting of right lateral costophrenic angle, which may represent small right pleural effusion, grossly similar to the prior study. Bilateral lung fields are otherwise clear. Left lateral costophrenic angle is clear. Stable  cardio-mediastinal silhouette. No acute osseous abnormalities. The soft tissues are within normal limits. IMPRESSION: *Redemonstration of blunting of right lateral costophrenic angle, which may represent small right pleural effusion. No significant interval change. *No acute cardiopulmonary abnormality. Electronically Signed   By: Jules Schick M.D.   On: 01/18/2023 14:18    Pending Labs [employer] (From admission, onward)     Start     Ordered  01/25/23 0500  Creatinine, serum  (enoxaparin (LOVENOX)    CrCl >/= 30 ml/min)  Weekly,   R     Comments: while on enoxaparin therapy    01/18/23 1759   01/19/23 0500  APTT  Tomorrow morning,   R        01/18/23 1759   01/19/23 0500  Protime-INR  Tomorrow morning,   R        01/18/23 1759   01/19/23 0500  Basic metabolic panel  Tomorrow morning,   R        01/18/23 1759   01/19/23 0500  CBC  Tomorrow morning,   R        01/18/23 1759   01/18/23 1800  Culture, blood (Routine X 2) w Reflex to ID Panel  BLOOD CULTURE X 2,   R (with TIMED occurrences)      01/18/23 1800   01/18/23 1759  CBC  (enoxaparin (LOVENOX)    CrCl >/= 30 ml/min)  Once,   R       Comments: Baseline for enoxaparin therapy IF NOT ALREADY DRAWN.  Notify MD if PLT < 100 K.    01/18/23 1759            Vitals/Pain Today's Vitals   01/18/23 1430 01/18/23 1500 01/18/23 1900 01/18/23 1915  BP: 129/77 (!) 140/57 137/66 137/66  Pulse: 96 94 90 93  Resp: 16 17  19   Temp:  97.9 F (36.6 C) 98.8 F (37.1 C)   TempSrc:  Oral Oral   SpO2: 100% 100% 100% 100%  Weight:      Height:      PainSc:   0-No pain     Isolation Precautions No active isolations  Medications Medications  enoxaparin (LOVENOX) injection 40 mg (has no administration in time range)  acetaminophen (TYLENOL) tablet 650 mg (has no administration in time range)    Or  acetaminophen (TYLENOL) suppository 650 mg (has no administration in time range)  polyethylene glycol (MIRALAX / GLYCOLAX) packet  17 g (has no administration in time range)  sodium chloride flush (NS) 0.9 % injection 3 mL (has no administration in time range)  Ampicillin-Sulbactam (UNASYN) 3 g in sodium chloride 0.9 % 100 mL IVPB (has no administration in time range)  potassium chloride 10 mEq in 100 mL IVPB (10 mEq Intravenous New Bag/Given 01/18/23 1916)  magnesium sulfate IVPB 2 g 50 mL (0 g Intravenous Stopped 01/18/23 1509)  iohexol (OMNIPAQUE) 350 MG/ML injection 75 mL (75 mLs Intravenous Contrast Given 01/18/23 1513)    Mobility walks with device

## 2023-01-18 NOTE — H&P (Signed)
History and Physical    Patient: [name] NGE:[id number] DOB: 1945/06/12 DOA: 01/18/2023 DOS: the patient was seen and examined on 01/18/2023 PCP: [name], MD  Patient coming from: Home  Chief Complaint:  Chief Complaint  Patient presents with   Shortness of Breath   HPI: [name] is a 77 y.o. male with medical history significant of remote lung cancer for which patient has previously had partial pneumonectomy on the right.  Further patient has ongoing diagnosis of renal cancer for which patient is under active treatment.  Patient generally reports being ambulatory.  However has had progressive generalized weakness for the last several months as per the daughter at the bedside.  Principal historian for this encounter is the daughter at the bedside as patient does not seem interested in talking much.  Patient was in his usual state of health till about a month ago when he reportedly has developed new onset bilateral lower extremity swelling.  Which has been progressive slightly since then.  Patient has been treated with Lasix for the swelling.  However the response has been poor.  Is no report of patient having fever nausea vomiting diarrhea.  Patient had a fall this past Friday, 4 days ago and has been complaining of low back pain since then.  Patient has however continued to ambulate I will buy it weekly so.  Patient had a home health care evaluation today for PT that has been ongoing for several weeks.  Patient was noted to be hypoxic to 70% on room air.  Patient was sent to ER.  Again there is no report of cough or chest pain or fever or shortness of breath.  Patient has responded well to 2 L/min of supplementary oxygen at home and is saturating 100%.  Patient has had a CAT scan done.  Medical evaluation is sought. Review of Systems: As mentioned in the history of present illness. All other systems reviewed and are negative. Past Medical History:  Diagnosis Date   Arthritis     Chronic kidney disease    only has one kidney    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 02/01/2015   Coronary artery disease    followed by Dr.Tilley   Frequent PVCs    GERD (gastroesophageal reflux disease)    Heart murmur    never has caused any problems   Hx of cancer of lung 1980's   Hyperlipidemia    Hypertension    Hypothyroidism    Incisional hernia 08/01/2015   Lung cancer (HCC) 1993   Lung metastases 2019   Pneumonia    x several   Recurrent umbilical hernia 08/01/2015   Past Surgical History:  Procedure Laterality Date   APPENDECTOMY  age 56   ARTERY BIOPSY Left 04/10/2022   Procedure: LEFT TEMPORAL ARTERY BIOPSY;  Surgeon: Chuck Hint, MD;  Location: [facility] OR;  Service: Vascular;  Laterality: Left;   CHOLECYSTECTOMY  10/18/1978   EYE SURGERY Bilateral 2019   cataract   LAPAROSCOPIC LYSIS OF ADHESIONS N/A 08/01/2015   Procedure: LAPAROSCOPIC LYSIS OF ADHESIONS;  Surgeon: Karie Soda, MD;  Location: [facility] ORS;  Service: General;  Laterality: N/A;   LUNG LOBECTOMY  02/16/1990   lung cancer- patient has staples in lung not to have MRI per patient    MAXILLARY ANTROSTOMY Left 05/22/2022   Procedure: MAXILLARY ANTROSTOMY;  Surgeon: Laren Boom, DO;  Location: [facility] OR;  Service: ENT;  Laterality: Left;   PILONIDAL CYST EXCISION  02/16/2009   Dr Gerrit Friends   ROBOTIC ASSITED PARTIAL NEPHRECTOMY Right 02/01/2015   Procedure: RIGHT ROBOTIC ASSISTED LAPAROSOCOPY NEPHRECTOMY;  Surgeon: Crist Fat, MD;  Location: [facility] ORS;  Service: Urology;  Laterality: Right;   SINUS ENDO WITH FUSION Left 05/22/2022   Procedure: SINUS ENDO WITH FUSION;  Surgeon: Laren Boom, DO;  Location: [facility] OR;  Service: ENT;  Laterality: Left;   VENTRAL HERNIA REPAIR N/A 08/01/2015   Procedure: LAPAROSCOPIC VENTRAL WALL HERNIA WITH MESH;  Surgeon: Karie Soda, MD;  Location: [facility] ORS;  Service: General;  Laterality: N/A;   Social History:  reports that he quit  smoking about 34 years ago. His smoking use included cigarettes. He started smoking about 55 years ago. He has a 20 pack-year smoking history. He has been exposed to tobacco smoke. He has never used smokeless tobacco. He reports that he does not currently use alcohol. He reports that he does not use drugs.  Allergies  Allergen Reactions   Diphenhydramine Other (See Comments)    Severely restless legs    Family History  Problem Relation Age of Onset   Heart attack Father     Prior to Admission medications   Medication Sig Start Date End Date Taking? Authorizing Provider  acetaminophen (TYLENOL) 500 MG tablet Take 1,000 mg by mouth as needed for mild pain (pain score 1-3) or moderate pain (pain score 4-6).   Yes [provider]  ADVIL 200 MG CAPS Take 400 mg by mouth every 6 (six) hours as needed (for mild pain).   Yes [provider]  Cholecalciferol (VITAMIN D3 PO) Take 1 capsule by mouth at bedtime.   Yes [provider]  diazepam (VALIUM) 2 MG tablet TAKE 1-2 TABLETS BY MOUTH EVERY 8 HOURS AS NEEDED FOR ANXIETY Patient taking differently: Take 2-4 mg by mouth every 8 (eight) hours as needed for anxiety. 01/09/23  Yes Pickenpack-Cousar, Arty Baumgartner, NP  diltiazem (CARDIZEM CD) 240 MG 24 hr capsule Take 1 capsule (240 mg total) by mouth in the morning. 03/26/22  Yes Croitoru, Mihai, MD  famotidine (PEPCID) 20 MG tablet Take 20 mg by mouth daily in the afternoon. 09/15/22  Yes [provider]  furosemide (LASIX) 20 MG tablet Take 20 mg by mouth in the morning. 12/26/22  Yes [provider]  hydrocortisone (CORTEF) 5 MG tablet Take 1 tablet (5 mg total) by mouth See admin instructions. Daily at 3pm. Patient taking differently: Take 5-20 mg by mouth See admin instructions. Take 15 mg by mouth in the morning and 5 mg at 3 PM daily 12/22/22  Yes Leeroy Bock, MD  levothyroxine (SYNTHROID) 175 MCG tablet Take 175 mcg by mouth daily before breakfast.    Yes [provider]  loratadine (CLARITIN) 10 MG tablet Take 10 mg by mouth daily.   Yes [provider]  megestrol (MEGACE) 20 MG tablet TAKE 1 TABLET BY MOUTH DAILY 12/26/22  Yes Heilingoetter, Cassandra L, PA-C  metoprolol succinate (TOPROL-XL) 100 MG 24 hr tablet Take 1 tablet (100 mg total) by mouth daily. Take with or immediately following a meal. 12/23/22 01/22/23 Yes Leeroy Bock, MD  Multiple Vitamins-Minerals (CENTRUM SILVER 50+MEN) TABS Take 1 tablet by mouth at bedtime.   Yes [provider]  mupirocin ointment (BACTROBAN) 2 % Place 1 Application into the nose daily as needed (sores).   Yes [provider]  potassium chloride 20 MEQ/15ML (10%) SOLN Take 15 mLs (20 mEq total) by mouth 2 (two) times  daily. 01/11/23  Yes Si Gaul, MD  [facility] 40 MG tablet Take 120 mg by mouth daily.   Yes [provider]  levothyroxine (SYNTHROID) 150 MCG tablet Take 1 tablet (150 mcg total) by mouth daily before breakfast. Patient not taking: Reported on 01/18/2023 08/03/22   Carlus Pavlov, MD  posaconazole (NOXAFIL) 100 MG TBEC delayed-release tablet Take 3 tablets (300 mg total) by mouth daily. Patient not taking: Reported on 01/18/2023 10/22/22   Danelle Earthly, MD    Physical Exam: Vitals:   01/18/23 1430 01/18/23 1500 01/18/23 1900 01/18/23 1915  BP: 129/77 (!) 140/57 137/66 137/66  Pulse: 96 94 90 93  Resp: 16 17  19   Temp:  97.9 F (36.6 C) 98.8 F (37.1 C)   TempSrc:  Oral Oral   SpO2: 100% 100% 100% 100%  Weight:      Height:       General: Patient on 2 L/min of supplementary oxygen does not appear to be distressed.  Patient is alert and awake, cooperative, however not interested in talking illness or daughter to the talking. Respiratory exam: Diminished breath sounds right lung base posteriorly with dullness to percussion otherwise vesicular air entry.  Good excursion Cardiovascular exam S1-S2 normal Abdomen all quadrants are  soft nontender Extremities 1-2+ edema bilateral lower extremities. Data Reviewed:  Labs on Admission:  Results for orders placed or performed during the hospital encounter of 01/18/23 (from the past 24 hour(s))  Basic metabolic panel     Status: Abnormal   Collection Time: 01/18/23 11:33 AM  Result Value Ref Range   Sodium 139 135 - 145 mmol/L   Potassium <2.0 (LL) 3.5 - 5.1 mmol/L   Chloride 96 (L) 98 - 111 mmol/L   CO2 34 (H) 22 - 32 mmol/L   Glucose, Bld 97 70 - 99 mg/dL   BUN 12 8 - 23 mg/dL   Creatinine, Ser 8.65 0.61 - 1.24 mg/dL   Calcium 7.6 (L) 8.9 - 10.3 mg/dL   GFR, Estimated >78 >46 mL/min   Anion gap 9 5 - 15  Brain natriuretic peptide     Status: Abnormal   Collection Time: 01/18/23 11:33 AM  Result Value Ref Range   B Natriuretic Peptide 440.3 (H) 0.0 - 100.0 pg/mL  CBC with Differential     Status: Abnormal   Collection Time: 01/18/23 11:33 AM  Result Value Ref Range   WBC 8.8 4.0 - 10.5 K/uL   RBC 5.37 4.22 - 5.81 MIL/uL   Hemoglobin 13.1 13.0 - 17.0 g/dL   HCT 96.2 95.2 - 84.1 %   MCV 89.2 80.0 - 100.0 fL   MCH 24.4 (L) 26.0 - 34.0 pg   MCHC 27.3 (L) 30.0 - 36.0 g/dL   RDW 32.4 (H) 40.1 - 02.7 %   Platelets 242 150 - 400 K/uL   nRBC 0.0 0.0 - 0.2 %   Neutrophils Relative % 78 %   Neutro Abs 6.9 1.7 - 7.7 K/uL   Lymphocytes Relative 13 %   Lymphs Abs 1.2 0.7 - 4.0 K/uL   Monocytes Relative 7 %   Monocytes Absolute 0.6 0.1 - 1.0 K/uL   Eosinophils Relative 1 %   Eosinophils Absolute 0.1 0.0 - 0.5 K/uL   Basophils Relative 0 %   Basophils Absolute 0.0 0.0 - 0.1 K/uL   Immature Granulocytes 1 %   Abs Immature Granulocytes 0.06 0.00 - 0.07 K/uL  D-dimer, quantitative     Status: Abnormal   Collection Time: 01/18/23 11:33  AM  Result Value Ref Range   D-Dimer, Quant 4.25 (H) 0.00 - 0.50 ug/mL-FEU  Troponin I (High Sensitivity)     Status: Abnormal   Collection Time: 01/18/23 11:33 AM  Result Value Ref Range   Troponin I (High Sensitivity) 19 (H) <18  ng/L  Magnesium     Status: None   Collection Time: 01/18/23 11:33 AM  Result Value Ref Range   Magnesium 1.8 1.7 - 2.4 mg/dL  Hepatic function panel     Status: Abnormal   Collection Time: 01/18/23 11:33 AM  Result Value Ref Range   Total Protein 5.8 (L) 6.5 - 8.1 g/dL   Albumin 2.2 (L) 3.5 - 5.0 g/dL   AST 14 (L) 15 - 41 U/L   ALT 12 0 - 44 U/L   Alkaline Phosphatase 164 (H) 38 - 126 U/L   Total Bilirubin 0.4 <1.2 mg/dL   Bilirubin, Direct 0.1 0.0 - 0.2 mg/dL   Indirect Bilirubin 0.3 0.3 - 0.9 mg/dL  Resp panel by RT-PCR (RSV, Flu A&B, Covid) Anterior Nasal Swab     Status: None   Collection Time: 01/18/23 11:36 AM   Specimen: Anterior Nasal Swab  Result Value Ref Range   SARS Coronavirus 2 by RT PCR NEGATIVE NEGATIVE   Influenza A by PCR NEGATIVE NEGATIVE   Influenza B by PCR NEGATIVE NEGATIVE   Resp Syncytial Virus by PCR NEGATIVE NEGATIVE  TSH     Status: Abnormal   Collection Time: 01/18/23  6:07 PM  Result Value Ref Range   TSH 8.917 (H) 0.350 - 4.500 uIU/mL  Troponin I (High Sensitivity)     Status: Abnormal   Collection Time: 01/18/23  6:07 PM  Result Value Ref Range   Troponin I (High Sensitivity) 20 (H) <18 ng/L  Procalcitonin     Status: None   Collection Time: 01/18/23  6:07 PM  Result Value Ref Range   Procalcitonin 0.95 ng/mL  Creatinine, serum     Status: None   Collection Time: 01/18/23  8:20 PM  Result Value Ref Range   Creatinine, Ser 0.73 0.61 - 1.24 mg/dL   GFR, Estimated >40 >98 mL/min   Basic Metabolic Panel: Recent Labs  Lab 01/18/23 1133 01/18/23 2020  NA 139  --   K <2.0*  --   CL 96*  --   CO2 34*  --   GLUCOSE 97  --   BUN 12  --   CREATININE 1.01 0.73  CALCIUM 7.6*  --   MG 1.8  --    Liver Function Tests: Recent Labs  Lab 01/18/23 1133  AST 14*  ALT 12  ALKPHOS 164*  BILITOT 0.4  PROT 5.8*  ALBUMIN 2.2*   No results for input(s): "LIPASE", "AMYLASE" in the last 168 hours. No results for input(s): "AMMONIA" in the last  168 hours. CBC: Recent Labs  Lab 01/18/23 1133  WBC 8.8  NEUTROABS 6.9  HGB 13.1  HCT 47.9  MCV 89.2  PLT 242   Cardiac Enzymes: Recent Labs  Lab 01/18/23 1133 01/18/23 1807  TROPONINIHS 19* 20*    BNP (last 3 results) No results for input(s): "PROBNP" in the last 8760 hours. CBG: No results for input(s): "GLUCAP" in the last 168 hours.  Radiological Exams on Admission:  CT Angio Chest Pulmonary Embolism (PE) W or WO Contrast  Result Date: 01/18/2023 CLINICAL DATA:  Shortness of breath history of lung cancer EXAM: CT ANGIOGRAPHY CHEST WITH CONTRAST TECHNIQUE: Multidetector CT imaging of the chest  was performed using the standard protocol during bolus administration of intravenous contrast. Multiplanar CT image reconstructions and MIPs were obtained to evaluate the vascular anatomy. RADIATION DOSE REDUCTION: This exam was performed according to the departmental dose-optimization program which includes automated exposure control, adjustment of the mA and/or kV according to patient size and/or use of iterative reconstruction technique. CONTRAST:  75mL OMNIPAQUE IOHEXOL 350 MG/ML SOLN COMPARISON:  CT 11/04/2022, 07/13/2022 FINDINGS: Cardiovascular: Satisfactory opacification of the pulmonary arteries to the segmental level. No evidence of pulmonary embolism. Mild aortic atherosclerosis. No aneurysm or dissection. Coronary vascular calcification. Normal cardiac size. No sizable pericardial effusion. Mediastinum/Nodes: Patent trachea. No suspicious thyroid mass. No suspicious mediastinal lymph nodes. Esophagus within normal limits. Lungs/Pleura: Small right greater than left pleural effusions. Interval mild right pleural thickening compared to prior. Status post partial right lung resection. No acute airspace disease. Slight nodularity along the right posterolateral pleural surface, series 4, image 60 Upper Abdomen: Partially visualized fatty density at the right nephrectomy bed. Small volume  ascites in the upper abdomen. Known hepatic metastatic disease, difficult to appreciate likely due to phase of contrast. Incompletely visualized large pancreatic mass and peripancreatic masses. Suspicion of increased porta hepatis adenopathy. Pancreatic ductal dilatation. Musculoskeletal: Mild chronic endplate deformities at T8 and T9. Mild superior endplate deformity at T10, possibly acute to subacute. Mild paravertebral edema at this level. Review of the MIP images confirms the above findings. IMPRESSION: 1. Negative for acute pulmonary embolus. 2. New small right greater than left pleural effusions. Interval mild right pleural thickening compared to prior, with slight nodularity along the right posterolateral pleural surface. Findings raise concern for possible pleural metastatic disease. 3. Incompletely visualized large pancreatic mass and peripancreatic masses. Known hepatic metastatic disease. Suspicion of increased porta hepatis adenopathy. Small volume ascites in the upper abdomen. 4. Mild superior endplate deformity at T10, possibly acute to subacute. Correlate for back pain 5. Aortic atherosclerosis. Aortic Atherosclerosis (ICD10-I70.0). Electronically Signed   By: Jasmine Pang M.D.   On: 01/18/2023 17:21   VAS [place] LOWER EXTREMITY VENOUS (DVT) (7a-7p)  Result Date: 01/18/2023  Lower Venous DVT Study Patient Name:  [name]  Date of Exam:   01/18/2023 Medical Rec #: [id number]   Accession #:    [id number] Date of Birth: [birth year]   Patient Gender: M Patient Age:   33 years Exam Location:  [facility] Procedure:      VAS [place] LOWER EXTREMITY VENOUS (DVT) Referring Phys: JULIE HAVILAND --------------------------------------------------------------------------------  Indications: SOB, Pitting edema., Swelling, and Edema.  Risk Factors: Cancer H/O lung cancer. Comparison Study: No prior exam. Performing Technologist: Fernande Bras  Examination Guidelines: A complete evaluation includes B-mode  imaging, spectral Doppler, color Doppler, and power Doppler as needed of all accessible portions of each vessel. Bilateral testing is considered an integral part of a complete examination. Limited examinations for reoccurring indications may be performed as noted. The reflux portion of the exam is performed with the patient in reverse Trendelenburg.  +---------+---------------+---------+-----------+----------+--------------+ RIGHT    CompressibilityPhasicitySpontaneityPropertiesThrombus Aging +---------+---------------+---------+-----------+----------+--------------+ CFV      Full           Yes      Yes                                 +---------+---------------+---------+-----------+----------+--------------+ SFJ      Full                                                        +---------+---------------+---------+-----------+----------+--------------+  FV Prox  Full                                                        +---------+---------------+---------+-----------+----------+--------------+ FV Mid   Full                                                        +---------+---------------+---------+-----------+----------+--------------+ FV DistalFull                                                        +---------+---------------+---------+-----------+----------+--------------+ PFV      Full                                                        +---------+---------------+---------+-----------+----------+--------------+ POP      Full           Yes      Yes                                 +---------+---------------+---------+-----------+----------+--------------+ PTV      Full                                                        +---------+---------------+---------+-----------+----------+--------------+ PERO     Full                                                        +---------+---------------+---------+-----------+----------+--------------+    +---------+---------------+---------+-----------+----------+--------------+ LEFT     CompressibilityPhasicitySpontaneityPropertiesThrombus Aging +---------+---------------+---------+-----------+----------+--------------+ CFV      Full           Yes      Yes                                 +---------+---------------+---------+-----------+----------+--------------+ SFJ      Full                                                        +---------+---------------+---------+-----------+----------+--------------+ FV Prox  Full                                                        +---------+---------------+---------+-----------+----------+--------------+  FV Mid   Full                                                        +---------+---------------+---------+-----------+----------+--------------+ FV DistalFull                                                        +---------+---------------+---------+-----------+----------+--------------+ PFV      Full                                                        +---------+---------------+---------+-----------+----------+--------------+ POP      Full           Yes      Yes                                 +---------+---------------+---------+-----------+----------+--------------+ PTV      Full                                                        +---------+---------------+---------+-----------+----------+--------------+ PERO     Full                                                        +---------+---------------+---------+-----------+----------+--------------+    Summary: BILATERAL: - No evidence of deep vein thrombosis seen in the lower extremities, bilaterally. -No evidence of popliteal cyst, bilaterally.   *See table(s) above for measurements and observations.    Preliminary    DG Chest Port 1 View  Result Date: 01/18/2023 CLINICAL DATA:  Shortness of breath. EXAM: PORTABLE CHEST 1 VIEW COMPARISON:   12/20/2022. FINDINGS: Redemonstration of elevated right hemidiaphragm. There is blunting of right lateral costophrenic angle, which may represent small right pleural effusion, grossly similar to the prior study. Bilateral lung fields are otherwise clear. Left lateral costophrenic angle is clear. Stable cardio-mediastinal silhouette. No acute osseous abnormalities. The soft tissues are within normal limits. IMPRESSION: *Redemonstration of blunting of right lateral costophrenic angle, which may represent small right pleural effusion. No significant interval change. *No acute cardiopulmonary abnormality. Electronically Signed   By: Jules Schick M.D.   On: 01/18/2023 14:18    EKG: Independently reviewed. NSR with PVC   Assessment and Plan: * Pleural effusion Hypoxic respiratory failure.  Given patient's immunocompromise there was initial concern of infectious etiology, therefore blood cultures were drawn patient empirically started on Unasyn.  However since then procalcitonin is negative patient has not spiked a fever and white count remains low.  Patient looking nontoxic I will bite fatigue.  At this time further antibiotics will be held.  Etiology of patient's respiratory  failure/pleural effusion seems to be systemic fluid overload as manifested by lower extremity swelling as well as poor response to Lasix thus far as per family.  Patient lower extremity Doppler is preliminary negative, kindly follow-up the definitive interpretation of the exam.  Regarding the etiology of fluid overload, it is noted that the patient's echo was done on December 21, 2022 with finding of left ventricular ejection fraction of 55 to 60% which is normal.  Patient's LFTs, seem to be chronically elevated with finding of adenopathy on CAT scan today which is slightly worse than before.  ALP elevation is chronic.  About 164.  Bilirubin within normal limit.  AST and ALT are not elevated.  I will also check urine protein and creatinine  ratio.  Patient would benefit from escalation of Lasix, daily I's and O's and weight monitoring.  Unfortunately given patient's severe hypokalemia I do not think patient should get Lasix just yet.  Till we correct the hypokalemia better.  After that intent to start aggressive diuresis.  Hypokalemia Severe, basically undetectable on our assay today.  Likely due to Lasix use at home.  Therefore patient received 40 mEq of IV KCl.  Patient creatinine is at baseline.  I am going to go ahead and give him 60 mEq KCl right now, patient is pending a BMP to recheck this level. Keep on telemetry. Mg is wnl  TSH elevation Seems to be nonspecific at this time given the hypoxic respiratory failure.  However I will check a free T4 in the morning to make sure were not missing a marked thyroid insufficiency. Resume 175 mcg of synthroid that paitnet is supposed to be taking at home - concern for patient skipping doses at this time.    Back pain Had a fall about 4 days ago and is complaining of low back pain, on exam seems to be over L2-L3 area.  No focal tenderness over the site, it does not seem to have been imaged on the CAT scan of the thorax.  Will get an x-ray.  If no finding, patient may benefit from cross-sectional imaging given his diagnosis of an active cancer  Secondary adrenal insufficiency (HCC) This is chronically documented in chart.  Her discharge summary on November 5, patient is supposed to be on 20 mg of hydrocortisone in the morning and 5 mg at 3 PM.  However patient is currently using 15 and 5 mg respectively in the morning and at afternoon times.  I will continue patient's medication as he is taking at home.  In case of any vital instability, administer stress dose steroids.  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 Consider onc input on this medication as needed  Patient troponin elevation is nonspecific and due to hypoxia, I do  not think further workup is needed given the flat troponin levels    Advance Care Planning:   Code Status: Full Code   Consults: none at this time.  Family Communication: daughter at bedside. Careplan discussed  Severity of Illness: The appropriate patient status for this patient is INPATIENT. Inpatient status is judged to be reasonable and necessary in order to provide the required intensity of service to ensure the patient's safety. The patient's presenting symptoms, physical exam findings, and initial radiographic and laboratory data in the context of their chronic comorbidities is felt to place them at high risk for further clinical deterioration. Furthermore, it is not anticipated that the patient will be medically stable  for discharge from the hospital within 2 midnights of admission.   * I certify that at the point of admission it is my clinical judgment that the patient will require inpatient hospital care spanning beyond 2 midnights from the point of admission due to high intensity of service, high risk for further deterioration and high frequency of surveillance required.*  Author: Nolberto Hanlon, MD 01/18/2023 8:58 PM  For on call review [email or web address].

## 2023-01-18 NOTE — ED Provider Notes (Signed)
Patient signed out to me by previous provider. Please refer to their note for full HPI.  Briefly this is a 77 year old male who is here with a new oxygen requirement, hypoxia most likely secondary to pleural effusions from ongoing lung cancer disease as well as persistent hypokalemia.  Patients evaluation and results requires admission for further treatment and care.  Spoke with hospitalist, reviewed patient's ED course and they accept admission.  Patient agrees with admission plan, offers no new complaints and is stable/unchanged at time of admit.   Rozelle Logan, DO 01/18/23 1807

## 2023-01-19 DIAGNOSIS — J90 Pleural effusion, not elsewhere classified: Secondary | ICD-10-CM | POA: Diagnosis not present

## 2023-01-19 LAB — CBC
HCT: 26.5 % — ABNORMAL LOW (ref 39.0–52.0)
Hemoglobin: 7.4 g/dL — ABNORMAL LOW (ref 13.0–17.0)
MCH: 24.6 pg — ABNORMAL LOW (ref 26.0–34.0)
MCHC: 27.9 g/dL — ABNORMAL LOW (ref 30.0–36.0)
MCV: 88 fL (ref 80.0–100.0)
Platelets: 301 10*3/uL (ref 150–400)
RBC: 3.01 MIL/uL — ABNORMAL LOW (ref 4.22–5.81)
RDW: 19.7 % — ABNORMAL HIGH (ref 11.5–15.5)
WBC: 10.1 10*3/uL (ref 4.0–10.5)
nRBC: 0 % (ref 0.0–0.2)

## 2023-01-19 LAB — BASIC METABOLIC PANEL
Anion gap: 9 (ref 5–15)
Anion gap: 7 (ref 5–15)
BUN: 8 mg/dL (ref 8–23)
BUN: 9 mg/dL (ref 8–23)
CO2: 31 mmol/L (ref 22–32)
CO2: 34 mmol/L — ABNORMAL HIGH (ref 22–32)
Calcium: 7.3 mg/dL — ABNORMAL LOW (ref 8.9–10.3)
Calcium: 7.4 mg/dL — ABNORMAL LOW (ref 8.9–10.3)
Chloride: 98 mmol/L (ref 98–111)
Chloride: 95 mmol/L — ABNORMAL LOW (ref 98–111)
Creatinine, Ser: 0.67 mg/dL (ref 0.61–1.24)
Creatinine, Ser: 0.87 mg/dL (ref 0.61–1.24)
GFR, Estimated: >60 mL/min (ref >60)
GFR, Estimated: >60 mL/min (ref >60)
Glucose, Bld: 71 mg/dL (ref 70–99)
Glucose, Bld: 126 mg/dL — ABNORMAL HIGH (ref 70–99)
Potassium: 2.3 mmol/L — CL (ref 3.5–5.1)
Potassium: 2.8 mmol/L — ABNORMAL LOW (ref 3.5–5.1)
Sodium: 138 mmol/L (ref 135–145)
Sodium: 136 mmol/L (ref 135–145)

## 2023-01-19 LAB — TSH: TSH: 10.148 u[IU]/mL — ABNORMAL HIGH (ref 0.350–4.500)

## 2023-01-19 LAB — PROTIME-INR
INR: 1.2 (ref 0.8–1.2)
Prothrombin Time: 15.6 s — ABNORMAL HIGH (ref 11.4–15.2)

## 2023-01-19 LAB — APTT: aPTT: 36 s (ref 24–36)

## 2023-01-19 LAB — T4, FREE: Free T4: 1.16 ng/dL — ABNORMAL HIGH (ref 0.61–1.12)

## 2023-01-19 MED ORDER — FUROSEMIDE 10 MG/ML IJ SOLN
20.0000 mg | Freq: Once | INTRAMUSCULAR | Status: AC
  Administered 2023-01-19: 20 mg via INTRAVENOUS
  Filled 2023-01-19: qty 2

## 2023-01-19 MED ORDER — POTASSIUM CHLORIDE 20 MEQ PO PACK
60.0000 meq | PACK | Freq: Once | ORAL | Status: AC
  Administered 2023-01-19: 60 meq via ORAL
  Filled 2023-01-19: qty 3

## 2023-01-19 MED ORDER — MAGNESIUM OXIDE -MG SUPPLEMENT 400 (240 MG) MG PO TABS
800.0000 mg | ORAL_TABLET | Freq: Every day | ORAL | Status: DC
  Administered 2023-01-19 – 2023-01-23 (×5): 800 mg via ORAL
  Filled 2023-01-19 (×5): qty 2

## 2023-01-19 MED ORDER — POTASSIUM CHLORIDE CRYS ER 20 MEQ PO TBCR
40.0000 meq | EXTENDED_RELEASE_TABLET | ORAL | Status: AC
  Administered 2023-01-19 (×2): 40 meq via ORAL
  Filled 2023-01-19 (×2): qty 2

## 2023-01-19 MED ORDER — BELZUTIFAN 40 MG PO TABS: 120.0000 mg | ORAL_TABLET | Freq: Every day | ORAL | Status: DC

## 2023-01-19 MED ORDER — METOPROLOL SUCCINATE ER 50 MG PO TB24
50.0000 mg | ORAL_TABLET | Freq: Every day | ORAL | Status: DC
  Administered 2023-01-20 – 2023-01-23 (×4): 50 mg via ORAL
  Filled 2023-01-19 (×4): qty 1

## 2023-01-19 MED ORDER — DILTIAZEM HCL ER COATED BEADS 180 MG PO CP24
180.0000 mg | ORAL_CAPSULE | Freq: Every day | ORAL | Status: DC
  Administered 2023-01-20 – 2023-01-23 (×4): 180 mg via ORAL
  Filled 2023-01-19 (×4): qty 1

## 2023-01-19 NOTE — Progress Notes (Signed)
[facility] Progress Note Patient: [name] XLK:[id number] DOB: [birth year] DOA: 01/18/2023  DOS: the patient was seen and examined on 01/19/2023  Brief hospital course: PMH of lung cancer SP right pneumonectomy, metastatic renal cancer SP right nephrectomy, currently on belzutifan, CAD, GERD, HLD, HTN, hypothyroidism. Present to the hospital with complaints of leg swelling progressively worsening as well as shortness of breath and fatigue with hypoxia. Found to have small pleural effusion with hypoxia likely from volume overload. Receiving IV diuresis. Assessment and Plan: Acute on chronic diastolic CHF. Bilateral pleural effusion-small. Presents with shortness of breath as well as volume overload. Has acutely worsening lower extremity edema. Dopplers are negative for DVT as well as CT PE protocol negative for PE. No evidence of pneumonia. Recent echocardiogram in November 2024 shows EF of 65%.  Diastolic dysfunction.  No significant valvular abnormality. Will be initiating IV Lasix and monitor.  Severe hypokalemia. Level was undetectable at the time of admission. Aggressively repleted. Currently improving but still low. Continue telemetry.  Continue repletion.  Magnesium level relatively borderline therefore was also replaced.  Anemia of chronic disease. Hemoglobin of 13.1 is a misnomer or lab abnormality. Baseline hemoglobin is around 7-8. Currently hemoglobin ranging in the same territory. No bleeding. Monitor.  Metastatic renal disease with history of nephrectomy. On belzutifan. Occasionally it can cause anemia. For now we will monitor.  Lung cancer with history of pneumonectomy. Monitor for now.  Adrenal insufficiency. On hydrocortisone 15 and 5 mg. Monitor. May require stress dose.  Elevated TSH. Unclear etiology.  TSH is 8.9 on a recheck was 10. Free T4 is actually 1.16. At present given his withdrawn appearance I would actually agree with continuation of  high-dose of Synthroid 175 mcg.  Anorexia. On Megace. For now continue.  Recent fungal infection  Completed 6 months of posaconazole earlier this month. Monitor.  Hypertension. paroxysmal A-fib. Patient has been on Cardizem 240 mg daily as well as metoprolol 100 mg daily. Blood pressure is better.  Will likely reduce the dose of Cardizem and metoprolol to allow room for Lasix for diuresis. Was recently hospitalized for recurrent fall at this time his Eliquis was discontinued.  Goals of care conversation. Patient has ACP documents suggesting he wants to be full code. Prognosis is very poor. Will monitor progress.  Subjective: Continues to have shortness of breath.  No nausea no vomiting.  No chest pain.  No abdomen.  Somewhat withdrawn.  Physical Exam: General: in Mild distress, No Rash Cardiovascular: S1 and S2 Present, No Murmur Respiratory: Good respiratory effort, Bilateral Air entry present.  Bilateral basal crackles, No wheezes Abdomen: Bowel Sound present, No tenderness Extremities: Bilateral edema Neuro: Alert and oriented x3, no new focal deficit  Data Reviewed: I have Reviewed nursing notes, Vitals, and Lab results. Since last encounter, pertinent lab results CBC and BMP   . I have ordered test including CBC and BMP  .   Disposition: Status is: Inpatient Remains inpatient appropriate because: Needing diuresis as well as may require thoracentesis.  SCDs Start: 01/18/23 1759   Family Communication: No one at bedside Level of care: Telemetry   continue due to CHF. Vitals:   01/19/23 0703 01/19/23 0706 01/19/23 0955 01/19/23 1321  BP:  (!) 122/55 113/60 (!) 105/56  Pulse:   71 67  Resp:   16 18  Temp:   (!) 97.4 F (36.3 C) 97.7 F (36.5 C)  TempSrc:   Oral Axillary  SpO2:   98% 98%  Weight: 73.7 kg  Height:         Author: Lynden Oxford, MD 01/19/2023 7:09 PM  Please look on [email or web address] to find out who is on call.

## 2023-01-19 NOTE — Plan of Care (Signed)
  Problem: Education: Goal: Knowledge of General Education information will improve Description: Including pain rating scale, medication(s)/side effects and non-pharmacologic comfort measures Outcome: Progressing   Problem: Clinical Measurements: Goal: Will remain free from infection Outcome: Progressing   Problem: Nutrition: Goal: Adequate nutrition will be maintained Outcome: Progressing   Problem: Elimination: Goal: Will not experience complications related to bowel motility Outcome: Progressing Goal: Will not experience complications related to urinary retention Outcome: Progressing   Problem: Safety: Goal: Ability to remain free from injury will improve Outcome: Progressing   

## 2023-01-19 NOTE — Progress Notes (Addendum)
[name]   DOB:11-07-1945   KP#:[id number]      ASSESSMENT & PLAN:   1. Pleural Effusion -CT chest done 01/18/23 shows small pleural effusions.  -CT is Negative for Pulm emb.  -continue O2 therapy via [place] at this time -monitor respiratory status -Pulm Eval as needed  2. Metastatic Renal Ca, Clear-cell with mets to liver and omentum -s/p right nephrectomy 02/01/2015 -s/p treatment with Ipilimumab/Nivolumab x4 cycles in 2019.  -s/p RT in 2021 and s/p oral drugs cabometyx and axitinib.  -patient prescribed Belzutifan by Dr. Clarene Duke at [facility] -last seen by Dr. Arbutus Ped on 01/11/23 in office.   3. Anemia of chronic disease -likely multifactorial due to renal disease +malignancy -s/p IV iron infusions -recommend transfuse PRBC for HGB <7.0 -monitor cbcd daily  4. Generalized weakness -reports progressive weakness over several months -PT eval may be considered -supportive care  Code Status Full   Subjective:  Pt seen awake and alert laying in bed eating. O2 via [place] at 2L intact. Denies nausea or vomiting.  Denies pain or other new acute complaints. No acute distress noted.   Objective:  Vitals:   01/19/23 0628 01/19/23 0706  BP: (!) 122/55 (!) 122/55  Pulse: 81   Resp: 18   Temp: 97.7 F (36.5 C)   SpO2: 100%      Intake/Output Summary (Last 24 hours) at 01/19/2023 5170 Last data filed at 01/19/2023 0174 Gross per 24 hour  Intake 295.39 ml  Output 500 ml  Net -204.61 ml     REVIEW OF SYSTEMS:   Constitutional: Denies fevers, chills or abnormal night sweats Eyes: Denies blurriness of vision, double vision or watery eyes Ears, nose, mouth, throat, and face: Denies mucositis or sore throat Respiratory: Denies cough, dyspnea or wheezes Cardiovascular: Denies palpitation, chest discomfort or lower extremity swelling Gastrointestinal:  Denies nausea, heartburn or change in bowel habits Skin: Denies abnormal skin rashes Lymphatics: Denies new lymphadenopathy or easy  bruising Neurological:Denies numbness, tingling or new weaknesses Behavioral/Psych: Mood is stable, no new changes  All other systems were reviewed with the patient and are negative.  PHYSICAL EXAMINATION: ECOG PERFORMANCE STATUS: 3 - Symptomatic, >50% confined to bed  Vitals:   01/19/23 0628 01/19/23 0706  BP: (!) 122/55 (!) 122/55  Pulse: 81   Resp: 18   Temp: 97.7 F (36.5 C)   SpO2: 100%    Filed Weights   01/18/23 1150 01/18/23 2144  Weight: 152 lb 1.9 oz (69 kg) 161 lb 9.6 oz (73.3 kg)    GENERAL:alert, no acute distress and comfortable SKIN: +pale skin color, texture, turgor are normal, no rashes or significant lesions EYES: normal, conjunctiva are pink and non-injected, sclera clear OROPHARYNX:no exudate, no erythema and lips, buccal mucosa, and tongue normal  NECK: supple, thyroid normal size, non-tender, without nodularity LYMPH:  no palpable lymphadenopathy in the cervical, axillary or inguinal LUNGS: +O2 via [place], clear to auscultation and percussion with normal breathing effort HEART: regular rate & rhythm and no murmurs and +bil lower ext edema +2 ABDOMEN:abdomen soft, non-tender and normal bowel sounds Musculoskeletal:no cyanosis of digits and no clubbing  PSYCH: alert & oriented x 3 with fluent speech NEURO: no focal motor/sensory deficits   All questions were answered. The patient knows to call the clinic with any problems, questions or concerns.   The total time spent in the appointment was 30 minutes encounter with patient including review of chart and various tests results, discussions about plan of care and coordination of care plan  Dietrich Pates Rouson, NP 01/19/2023 9:23 AM    Labs Reviewed:  Recent Labs    12/29/22 1458 01/11/23 1610 01/18/23 1133 01/18/23 2020 01/18/23 2211 01/18/23 2212 01/19/23 0544  NA 139 140 139  --   --   --  138  K 2.3* 2.3* <2.0*  --  2.3*  --  2.3*  CL 94* 92* 96*  --   --   --  98  CO2 37* 38* 34*  --   --   --  31   GLUCOSE 274* 181* 97  --   --   --  71  BUN 10 8 12   --   --   --  8  CREATININE 0.85 0.84 1.01 0.73  --  0.89 0.67  CALCIUM 8.0* 7.9* 7.6*  --   --   --  7.3*  GFRNONAA >60 >60 >60 >60  --  >60 >60  PROT 5.9* 6.3* 5.8*  --   --   --   --   ALBUMIN 2.7* 2.7* 2.2*  --   --   --   --   AST 8* 11* 14*  --   --   --   --   ALT 7 7 12   --   --   --   --   ALKPHOS 157* 233* 164*  --   --   --   --   BILITOT 0.6 0.8 0.4  --   --   --   --   BILIDIR  --   --  0.1  --   --   --   --   IBILI  --   --  0.3  --   --   --   --    CBC    Component Value Date/Time   WBC 10.1 01/19/2023 0544   RBC 3.01 (L) 01/19/2023 0544   HGB 7.4 (L) 01/19/2023 0544   HGB 8.5 (L) 01/11/2023 0842   HGB 13.3 03/11/2018 0913   HCT 26.5 (L) 01/19/2023 0544   HCT 40.1 03/11/2018 0913   PLT 301 01/19/2023 0544   PLT 357 01/11/2023 0842   PLT 248 03/11/2018 0913   MCV 88.0 01/19/2023 0544   MCV 86 03/11/2018 0913   MCH 24.6 (L) 01/19/2023 0544   MCHC 27.9 (L) 01/19/2023 0544   RDW 19.7 (H) 01/19/2023 0544   RDW 15.6 (H) 03/11/2018 0913   LYMPHSABS 1.2 01/18/2023 1133   LYMPHSABS 3.0 03/11/2018 0913   MONOABS 0.6 01/18/2023 1133   EOSABS 0.1 01/18/2023 1133   EOSABS 0.7 (H) 03/11/2018 0913   BASOSABS 0.0 01/18/2023 1133   BASOSABS 0.1 03/11/2018 0913    Studies Reviewed:  DG Lumbar Spine 2-3 Views  Result Date: 01/18/2023 CLINICAL DATA:  91320 Back pain 96045 EXAM: LUMBAR SPINE - 2-3 VIEW COMPARISON:  CT chest 07/13/2022 FINDINGS: Limited evaluation due to overlapping osseous structures and overlying soft tissues. L4-L5 and L5-S1 intervertebral disc space narrowing. Chronic appearing vertebral body height loss at the T9, T10, T11, L4, L5 levels. Multilevel mild degenerative changes spine as well as at least moderate facet arthropathy. There is no evidence of lumbar spine fracture. Straightening of normal lumbar lordosis likely due to positioning and degenerative changes. Otherwise alignment is normal. Severe  atherosclerotic plaque. IMPRESSION: 1. Negative for definite acute traumatic injury. Consider CT for further evaluation of the L5 level-limited evaluation due to overlapping osseous structures and overlying soft tissues. 2.  Aortic Atherosclerosis (ICD10-I70.0). Electronically Signed  By: Tish Frederickson M.D.   On: 01/18/2023 23:36   CT Angio Chest Pulmonary Embolism (PE) W or WO Contrast  Result Date: 01/18/2023 CLINICAL DATA:  Shortness of breath history of lung cancer EXAM: CT ANGIOGRAPHY CHEST WITH CONTRAST TECHNIQUE: Multidetector CT imaging of the chest was performed using the standard protocol during bolus administration of intravenous contrast. Multiplanar CT image reconstructions and MIPs were obtained to evaluate the vascular anatomy. RADIATION DOSE REDUCTION: This exam was performed according to the departmental dose-optimization program which includes automated exposure control, adjustment of the mA and/or kV according to patient size and/or use of iterative reconstruction technique. CONTRAST:  75mL OMNIPAQUE IOHEXOL 350 MG/ML SOLN COMPARISON:  CT 11/04/2022, 07/13/2022 FINDINGS: Cardiovascular: Satisfactory opacification of the pulmonary arteries to the segmental level. No evidence of pulmonary embolism. Mild aortic atherosclerosis. No aneurysm or dissection. Coronary vascular calcification. Normal cardiac size. No sizable pericardial effusion. Mediastinum/Nodes: Patent trachea. No suspicious thyroid mass. No suspicious mediastinal lymph nodes. Esophagus within normal limits. Lungs/Pleura: Small right greater than left pleural effusions. Interval mild right pleural thickening compared to prior. Status post partial right lung resection. No acute airspace disease. Slight nodularity along the right posterolateral pleural surface, series 4, image 60 Upper Abdomen: Partially visualized fatty density at the right nephrectomy bed. Small volume ascites in the upper abdomen. Known hepatic metastatic  disease, difficult to appreciate likely due to phase of contrast. Incompletely visualized large pancreatic mass and peripancreatic masses. Suspicion of increased porta hepatis adenopathy. Pancreatic ductal dilatation. Musculoskeletal: Mild chronic endplate deformities at T8 and T9. Mild superior endplate deformity at T10, possibly acute to subacute. Mild paravertebral edema at this level. Review of the MIP images confirms the above findings. IMPRESSION: 1. Negative for acute pulmonary embolus. 2. New small right greater than left pleural effusions. Interval mild right pleural thickening compared to prior, with slight nodularity along the right posterolateral pleural surface. Findings raise concern for possible pleural metastatic disease. 3. Incompletely visualized large pancreatic mass and peripancreatic masses. Known hepatic metastatic disease. Suspicion of increased porta hepatis adenopathy. Small volume ascites in the upper abdomen. 4. Mild superior endplate deformity at T10, possibly acute to subacute. Correlate for back pain 5. Aortic atherosclerosis. Aortic Atherosclerosis (ICD10-I70.0). Electronically Signed   By: Jasmine Pang M.D.   On: 01/18/2023 17:21   VAS [place] LOWER EXTREMITY VENOUS (DVT) (7a-7p)  Result Date: 01/18/2023  Lower Venous DVT Study Patient Name:  JOBANY WATROUS  Date of Exam:   01/18/2023 Medical Rec #: [id number]   Accession #:    [id number] Date of Birth: 1945/09/23   Patient Gender: M Patient Age:   76 years Exam Location:  [facility] Procedure:      VAS [place] LOWER EXTREMITY VENOUS (DVT) Referring Phys: JULIE HAVILAND --------------------------------------------------------------------------------  Indications: SOB, Pitting edema., Swelling, and Edema.  Risk Factors: Cancer H/O lung cancer. Comparison Study: No prior exam. Performing Technologist: Fernande Bras  Examination Guidelines: A complete evaluation includes B-mode imaging, spectral Doppler, color Doppler, and power  Doppler as needed of all accessible portions of each vessel. Bilateral testing is considered an integral part of a complete examination. Limited examinations for reoccurring indications may be performed as noted. The reflux portion of the exam is performed with the patient in reverse Trendelenburg.  +---------+---------------+---------+-----------+----------+--------------+ RIGHT    CompressibilityPhasicitySpontaneityPropertiesThrombus Aging +---------+---------------+---------+-----------+----------+--------------+ CFV      Full           Yes      Yes                                 +---------+---------------+---------+-----------+----------+--------------+  SFJ      Full                                                        +---------+---------------+---------+-----------+----------+--------------+ FV Prox  Full                                                        +---------+---------------+---------+-----------+----------+--------------+ FV Mid   Full                                                        +---------+---------------+---------+-----------+----------+--------------+ FV DistalFull                                                        +---------+---------------+---------+-----------+----------+--------------+ PFV      Full                                                        +---------+---------------+---------+-----------+----------+--------------+ POP      Full           Yes      Yes                                 +---------+---------------+---------+-----------+----------+--------------+ PTV      Full                                                        +---------+---------------+---------+-----------+----------+--------------+ PERO     Full                                                        +---------+---------------+---------+-----------+----------+--------------+    +---------+---------------+---------+-----------+----------+--------------+ LEFT     CompressibilityPhasicitySpontaneityPropertiesThrombus Aging +---------+---------------+---------+-----------+----------+--------------+ CFV      Full           Yes      Yes                                 +---------+---------------+---------+-----------+----------+--------------+ SFJ      Full                                                        +---------+---------------+---------+-----------+----------+--------------+  FV Prox  Full                                                        +---------+---------------+---------+-----------+----------+--------------+ FV Mid   Full                                                        +---------+---------------+---------+-----------+----------+--------------+ FV DistalFull                                                        +---------+---------------+---------+-----------+----------+--------------+ PFV      Full                                                        +---------+---------------+---------+-----------+----------+--------------+ POP      Full           Yes      Yes                                 +---------+---------------+---------+-----------+----------+--------------+ PTV      Full                                                        +---------+---------------+---------+-----------+----------+--------------+ PERO     Full                                                        +---------+---------------+---------+-----------+----------+--------------+    Summary: BILATERAL: - No evidence of deep vein thrombosis seen in the lower extremities, bilaterally. -No evidence of popliteal cyst, bilaterally.   *See table(s) above for measurements and observations.    Preliminary    DG Chest Port 1 View  Result Date: 01/18/2023 CLINICAL DATA:  Shortness of breath. EXAM: PORTABLE CHEST 1 VIEW COMPARISON:   12/20/2022. FINDINGS: Redemonstration of elevated right hemidiaphragm. There is blunting of right lateral costophrenic angle, which may represent small right pleural effusion, grossly similar to the prior study. Bilateral lung fields are otherwise clear. Left lateral costophrenic angle is clear. Stable cardio-mediastinal silhouette. No acute osseous abnormalities. The soft tissues are within normal limits. IMPRESSION: *Redemonstration of blunting of right lateral costophrenic angle, which may represent small right pleural effusion. No significant interval change. *No acute cardiopulmonary abnormality. Electronically Signed   By: Jules Schick M.D.   On: 01/18/2023 14:18   ECHOCARDIOGRAM COMPLETE  Result Date: 12/21/2022    ECHOCARDIOGRAM REPORT   Patient Name:   Chancey Lezama  Date of Exam: 12/21/2022 Medical Rec #:  [id number]  Height:       68.0 in Accession #:    [id number] Weight:       156.3 lb Date of Birth:  06-29-1945  BSA:          1.840 m Patient Age:    77 years   BP:           142/85 mmHg Patient Gender: M          HR:           106 bpm. Exam Location:  Inpatient Procedure: 2D Echo, Cardiac Doppler, Color Doppler and Intracardiac            Opacification Agent Indications:    Syncope R55  History:        Patient has prior history of Echocardiogram examinations, most                 recent 03/02/2022. CHF, CAD, Arrythmias:Atrial Fibrillation,                 Signs/Symptoms:Syncope; Risk Factors:Hypertension and                 Dyslipidemia. CKD.  Sonographer:    Lucendia Herrlich RCS Referring Phys: [phone] DAVID MANUEL ORTIZ  Sonographer Comments: Image acquisition challenging due to patient body habitus. IMPRESSIONS  1. Left ventricular ejection fraction, by estimation, is 55 to 60%. The left ventricle has normal function. The left ventricle has no regional wall motion abnormalities. Left ventricular diastolic parameters are consistent with Grade I diastolic dysfunction (impaired relaxation).  2. Right  ventricular systolic function is normal. The right ventricular size is normal.  3. The mitral valve is normal in structure. Trivial mitral valve regurgitation. No evidence of mitral stenosis.  4. The aortic valve is tricuspid. There is mild calcification of the aortic valve. Aortic valve regurgitation is not visualized. Aortic valve sclerosis/calcification is present, without any evidence of aortic stenosis.  5. The inferior vena cava is normal in size with greater than 50% respiratory variability, suggesting right atrial pressure of 3 mmHg. FINDINGS  Left Ventricle: Left ventricular ejection fraction, by estimation, is 55 to 60%. The left ventricle has normal function. The left ventricle has no regional wall motion abnormalities. Definity contrast agent was given IV to delineate the left ventricular  endocardial borders. The left ventricular internal cavity size was normal in size. There is no left ventricular hypertrophy. Left ventricular diastolic parameters are consistent with Grade I diastolic dysfunction (impaired relaxation). Right Ventricle: The right ventricular size is normal. No increase in right ventricular wall thickness. Right ventricular systolic function is normal. Left Atrium: Left atrial size was normal in size. Right Atrium: Right atrial size was normal in size. Pericardium: There is no evidence of pericardial effusion. Mitral Valve: The mitral valve is normal in structure. Mild mitral annular calcification. Trivial mitral valve regurgitation. No evidence of mitral valve stenosis. Tricuspid Valve: The tricuspid valve is normal in structure. Tricuspid valve regurgitation is not demonstrated. No evidence of tricuspid stenosis. Aortic Valve: The aortic valve is tricuspid. There is mild calcification of the aortic valve. Aortic valve regurgitation is not visualized. Aortic valve sclerosis/calcification is present, without any evidence of aortic stenosis. Aortic valve peak gradient measures 5.0 mmHg.  Pulmonic Valve: The pulmonic valve was not well visualized. Pulmonic valve regurgitation is not visualized. No evidence of pulmonic stenosis. Aorta: The ascending aorta was not well visualized and the aortic root was not well visualized.  Venous: The inferior vena cava is normal in size with greater than 50% respiratory variability, suggesting right atrial pressure of 3 mmHg. IAS/Shunts: No atrial level shunt detected by color flow Doppler.  LEFT VENTRICLE PLAX 2D LVIDd:         4.30 cm   Diastology LVIDs:         3.70 cm   LV e' medial:    6.20 cm/s LV PW:         0.70 cm   LV E/e' medial:  14.6 LV IVS:        0.90 cm   LV e' lateral:   8.27 cm/s LVOT diam:     2.10 cm   LV E/e' lateral: 11.0 LV SV:         41 LV SV Index:   22 LVOT Area:     3.46 cm  RIGHT VENTRICLE             IVC RV S prime:     12.10 cm/s  IVC diam: 1.90 cm TAPSE (M-mode): 1.3 cm LEFT ATRIUM             Index        RIGHT ATRIUM          Index LA diam:        2.60 cm 1.41 cm/m   RA Area:     7.10 cm LA Vol (A2C):   37.8 ml 20.54 ml/m  RA Volume:   8.04 ml  4.37 ml/m LA Vol (A4C):   25.8 ml 14.02 ml/m LA Biplane Vol: 34.0 ml 18.47 ml/m  AORTIC VALVE AV Area (Vmax): 2.20 cm AV Vmax:        112.00 cm/s AV Peak Grad:   5.0 mmHg LVOT Vmax:      71.10 cm/s LVOT Vmean:     46.000 cm/s LVOT VTI:       0.119 m MITRAL VALVE                TRICUSPID VALVE MV Area (PHT): 6.27 cm     TR Peak grad:   10.9 mmHg MV Decel Time: 121 msec     TR Vmax:        165.00 cm/s MR Peak grad: 19.0 mmHg MR Vmax:      218.00 cm/s   SHUNTS MV E velocity: 90.60 cm/s   Systemic VTI:  0.12 m MV A velocity: 113.00 cm/s  Systemic Diam: 2.10 cm MV E/A ratio:  0.80 Arvilla Meres MD Electronically signed by Arvilla Meres MD Signature Date/Time: 12/21/2022/10:31:26 AM    Final    DG Pelvis Portable  Result Date: 12/20/2022 CLINICAL DATA:  Multiple falls EXAM: PORTABLE PELVIS 1-2 VIEWS COMPARISON:  11/27/2022 FINDINGS: There is no evidence of pelvic fracture or  diastasis. No fracture or dislocation of the hips. No pelvic bone lesions are seen. IMPRESSION: Negative. Electronically Signed   By: Duanne Guess D.O.   On: 12/20/2022 18:53   CT Head Wo Contrast  Result Date: 12/20/2022 CLINICAL DATA:  Multiple falls including last night. Hit back of head. Posterior bruising and redness. EXAM: CT HEAD WITHOUT CONTRAST CT CERVICAL SPINE WITHOUT CONTRAST TECHNIQUE: Multidetector CT imaging of the head and cervical spine was performed following the standard protocol without intravenous contrast. Multiplanar CT image reconstructions of the cervical spine were also generated. RADIATION DOSE REDUCTION: This exam was performed according to the departmental dose-optimization program which includes automated exposure control, adjustment of the mA and/or kV according to  patient size and/or use of iterative reconstruction technique. COMPARISON:  11/27/2022 FINDINGS: CT HEAD FINDINGS Brain: There is no evidence for acute hemorrhage, hydrocephalus, mass lesion, or abnormal extra-axial fluid collection. No definite CT evidence for acute infarction. Diffuse loss of parenchymal volume is consistent with atrophy. Patchy low attenuation in the deep hemispheric and periventricular white matter is nonspecific, but likely reflects chronic microvascular ischemic demyelination. Vascular: No hyperdense vessel or unexpected calcification. Skull: No evidence for fracture. No worrisome lytic or sclerotic lesion. Sinuses/Orbits: The visualized paranasal sinuses and mastoid air cells are clear. Visualized portions of the globes and intraorbital fat are unremarkable. Other: None. CT CERVICAL SPINE FINDINGS Alignment: No evidence for traumatic subluxation. Skull base and vertebrae: No acute fracture. No primary bone lesion or focal pathologic process. Soft tissues and spinal canal: No prevertebral fluid or swelling. No visible canal hematoma. Disc levels: Mild loss of disc height noted C6-7 with  endplate spurring. Facet degeneration noted on the right at C2-3 and C3-4. Upper chest: Unremarkable. Other: None IMPRESSION: 1. No acute intracranial abnormality. 2. Atrophy with chronic small vessel white matter ischemic disease. 3. No cervical spine fracture or traumatic subluxation. Electronically Signed   By: Kennith Center M.D.   On: 12/20/2022 11:37   CT Cervical Spine Wo Contrast  Result Date: 12/20/2022 CLINICAL DATA:  Multiple falls including last night. Hit back of head. Posterior bruising and redness. EXAM: CT HEAD WITHOUT CONTRAST CT CERVICAL SPINE WITHOUT CONTRAST TECHNIQUE: Multidetector CT imaging of the head and cervical spine was performed following the standard protocol without intravenous contrast. Multiplanar CT image reconstructions of the cervical spine were also generated. RADIATION DOSE REDUCTION: This exam was performed according to the departmental dose-optimization program which includes automated exposure control, adjustment of the mA and/or kV according to patient size and/or use of iterative reconstruction technique. COMPARISON:  11/27/2022 FINDINGS: CT HEAD FINDINGS Brain: There is no evidence for acute hemorrhage, hydrocephalus, mass lesion, or abnormal extra-axial fluid collection. No definite CT evidence for acute infarction. Diffuse loss of parenchymal volume is consistent with atrophy. Patchy low attenuation in the deep hemispheric and periventricular white matter is nonspecific, but likely reflects chronic microvascular ischemic demyelination. Vascular: No hyperdense vessel or unexpected calcification. Skull: No evidence for fracture. No worrisome lytic or sclerotic lesion. Sinuses/Orbits: The visualized paranasal sinuses and mastoid air cells are clear. Visualized portions of the globes and intraorbital fat are unremarkable. Other: None. CT CERVICAL SPINE FINDINGS Alignment: No evidence for traumatic subluxation. Skull base and vertebrae: No acute fracture. No primary bone  lesion or focal pathologic process. Soft tissues and spinal canal: No prevertebral fluid or swelling. No visible canal hematoma. Disc levels: Mild loss of disc height noted C6-7 with endplate spurring. Facet degeneration noted on the right at C2-3 and C3-4. Upper chest: Unremarkable. Other: None IMPRESSION: 1. No acute intracranial abnormality. 2. Atrophy with chronic small vessel white matter ischemic disease. 3. No cervical spine fracture or traumatic subluxation. Electronically Signed   By: Kennith Center M.D.   On: 12/20/2022 11:37   DG Chest 2 View  Result Date: 12/20/2022 CLINICAL DATA:  Multiple falls over the past week.  Tachycardia. EXAM: CHEST - 2 VIEW COMPARISON:  Radiographs 12/08/2022 and 11/27/2022.  CT 11/04/2022. FINDINGS: 1049 hours. The heart size and mediastinal contours are stable. There is stable volume loss in the right hemithorax status post previous right middle and lower lobectomy. Stable pleural thickening on the right. The lungs are clear. There is no pleural effusion or pneumothorax. No  acute osseous findings are seen. IMPRESSION: Stable chest status post right middle and lower lobectomy. No evidence of acute cardiopulmonary process. Electronically Signed   By: Carey Bullocks M.D.   On: 12/20/2022 11:12    Addendum I have seen the patient, examined him. I agree with the assessment and and plan and have edited the notes.   I have reviewed his chart and lab, including Dr. Asa Lente note and his consult note with Dr. Clarene Duke at [facility] on 12/02/2022.  Pt has progressed through multiple line therapy for his metastatic RCC. He is currently on belzutifan, and his over health has significantly declined, he previously was hospitalized twice in the past few months.  He is somewhat confused and disoriented, not able to give me any history. I will call his daughter. Given the possible disease progression in his pleura and abdominal adenopathy, worsening anemia, I will hold belzutifan for now.  Please consult palliative care for GOC discussion. Dr. Arbutus Ped will be back late this week. We will probably start discussion of palliative care/hospice with pt's family. Will f/u.  Malachy Mood MD  01/19/2023

## 2023-01-19 NOTE — Plan of Care (Signed)

## 2023-01-19 NOTE — Progress Notes (Signed)
Chaplain met with Markie's daughter who had just arrived at bedside and Jedi was resting.  Chaplain provided paperwork and reviewed document with her. Her dad began waking up and needed assistance.  Paperwork left for family to discuss.  Please page if further questions arise or if Hadden is ready to have document notarized.  [address] Bcc Pager, [phone]

## 2023-01-20 DIAGNOSIS — E0781 Sick-euthyroid syndrome: Secondary | ICD-10-CM | POA: Diagnosis not present

## 2023-01-20 DIAGNOSIS — E876 Hypokalemia: Secondary | ICD-10-CM | POA: Diagnosis not present

## 2023-01-20 DIAGNOSIS — J9601 Acute respiratory failure with hypoxia: Secondary | ICD-10-CM | POA: Diagnosis not present

## 2023-01-20 DIAGNOSIS — J90 Pleural effusion, not elsewhere classified: Secondary | ICD-10-CM | POA: Diagnosis not present

## 2023-01-20 LAB — CBC WITH DIFFERENTIAL/PLATELET
Abs Immature Granulocytes: 0.14 10*3/uL — ABNORMAL HIGH (ref 0.00–0.07)
Basophils Absolute: 0 10*3/uL (ref 0.0–0.1)
Basophils Relative: 0 %
Eosinophils Absolute: 0.1 10*3/uL (ref 0.0–0.5)
Eosinophils Relative: 0 %
HCT: 27.5 % — ABNORMAL LOW (ref 39.0–52.0)
Hemoglobin: 7.5 g/dL — ABNORMAL LOW (ref 13.0–17.0)
Immature Granulocytes: 1 %
Lymphocytes Relative: 7 %
Lymphs Abs: 1.3 10*3/uL (ref 0.7–4.0)
MCH: 24.7 pg — ABNORMAL LOW (ref 26.0–34.0)
MCHC: 27.3 g/dL — ABNORMAL LOW (ref 30.0–36.0)
MCV: 90.5 fL (ref 80.0–100.0)
Monocytes Absolute: 1 10*3/uL (ref 0.1–1.0)
Monocytes Relative: 6 %
Neutro Abs: 15.7 10*3/uL — ABNORMAL HIGH (ref 1.7–7.7)
Neutrophils Relative %: 86 %
Platelets: 331 10*3/uL (ref 150–400)
RBC: 3.04 MIL/uL — ABNORMAL LOW (ref 4.22–5.81)
RDW: 19.6 % — ABNORMAL HIGH (ref 11.5–15.5)
WBC: 18.2 10*3/uL — ABNORMAL HIGH (ref 4.0–10.5)
nRBC: 0 % (ref 0.0–0.2)

## 2023-01-20 LAB — COMPREHENSIVE METABOLIC PANEL
ALT: 12 U/L (ref 0–44)
AST: 15 U/L (ref 15–41)
Albumin: 2.1 g/dL — ABNORMAL LOW (ref 3.5–5.0)
Alkaline Phosphatase: 159 U/L — ABNORMAL HIGH (ref 38–126)
Anion gap: 10 (ref 5–15)
BUN: 11 mg/dL (ref 8–23)
CO2: 33 mmol/L — ABNORMAL HIGH (ref 22–32)
Calcium: 7.8 mg/dL — ABNORMAL LOW (ref 8.9–10.3)
Chloride: 97 mmol/L — ABNORMAL LOW (ref 98–111)
Creatinine, Ser: 1 mg/dL (ref 0.61–1.24)
GFR, Estimated: >60 mL/min (ref >60)
Glucose, Bld: 134 mg/dL — ABNORMAL HIGH (ref 70–99)
Potassium: 3.2 mmol/L — ABNORMAL LOW (ref 3.5–5.1)
Sodium: 140 mmol/L (ref 135–145)
Total Bilirubin: 0.5 mg/dL (ref <1.2)
Total Protein: 5.6 g/dL — ABNORMAL LOW (ref 6.5–8.1)

## 2023-01-20 LAB — MAGNESIUM: Magnesium: 2 mg/dL (ref 1.7–2.4)

## 2023-01-20 MED ORDER — POTASSIUM CHLORIDE CRYS ER 20 MEQ PO TBCR
40.0000 meq | EXTENDED_RELEASE_TABLET | ORAL | Status: AC
  Administered 2023-01-20 (×2): 40 meq via ORAL
  Filled 2023-01-20 (×2): qty 2

## 2023-01-20 MED ORDER — LEVOTHYROXINE SODIUM 75 MCG PO TABS
150.0000 ug | ORAL_TABLET | Freq: Every day | ORAL | Status: DC
Start: 2023-01-21 — End: 2023-01-23
  Administered 2023-01-21 – 2023-01-23 (×3): 150 ug via ORAL
  Filled 2023-01-20 (×3): qty 2

## 2023-01-20 NOTE — Assessment & Plan Note (Addendum)
-   Suspected due to third spacing and underlying pleural effusions - Continue diuresis - Home oxygen arranged at discharge

## 2023-01-20 NOTE — Evaluation (Signed)
Physical Therapy Evaluation Patient Details Name: [name] MRN: [id number] DOB: 01-Jan-1946 Today's Date: 01/20/2023  History of Present Illness  77 yo male presents to therapy following hospitalization on 01/18/2023 due to progressive SOB, fatigue and LE edema. Pt sustained recent fall and since that time c/o LBP with significant fall hx and syncope/near syncope episodes. Pt has been declining cognitively and with IND with functional mobility tasks over the past 2 months with 2 recent hospitalizations-11/3 and 5/7. Pt was found to have small pleural effusion with hypoxia suspect volume overload with acute on chronic dCHF. Pt negative for DVT and PE. Pt has PMH including but not limited to: lung ca s/p partial pneumectomy on R, renal ca s/p nephrectomy, CKD III, PAF CAD, GERD, HTN, and HLD.  Clinical Impression     Pt admitted with above diagnosis.  Pt currently with functional limitations due to the deficits listed below (see PT Problem List). Pt in bed when PT arrived. Pt agreeable to therapy intervention and eager to get OOB. Pt required cues for completing supine to sit with increased IND and guided to use of bed rails with HOB elevated, increased time and CGA, sit to stand  from EOB with min A and cues, static standing B UE support with CGA for 4 mins, pt required CGA and cues for safety with gait tasks 40 feet with RW. Pt asymptomatic for orothostatic hypotension with positive findings, please see below for Bp and O2 findings. Pt left seated in recliner, all needs in place and on 2 L/min. Pt will benefit from acute skilled PT to increase their independence and safety with mobility to allow discharge.    Bp semi reclined at rest in bed 134/67 (81 PR) and 99% on 3 L/min, O2 saturation at rest on RA 98% Bp seated EOB 120/75 and O2 saturation decreased to 85% on RA and 2 L/min 1:14 to recover to 90%  Bp s/p 3 min seated EOB 133/68 (82 PR) 96% on 1 L/min  Bp immediate standing 131/63 (87 PR) O2 1 L/min  95% Bp s/p 3 min standing 114/64 ( 87 PR) and RA 95% Bp s/p gait in room and seated in recliner 110/65 (86 PR) O2 88% on RA    If plan is discharge home, recommend the following: A little help with walking and/or transfers;A little help with bathing/dressing/bathroom;Assistance with cooking/housework;Assist for transportation;Help with stairs or ramp for entrance   Can travel by private vehicle        Equipment Recommendations None recommended by PT  Recommendations for Other Services       Functional Status Assessment Patient has had a recent decline in their functional status and demonstrates the ability to make significant improvements in function in a reasonable and predictable amount of time.     Precautions / Restrictions Precautions Precautions: Fall (othostatic) Restrictions Weight Bearing Restrictions: No      Mobility  Bed Mobility Overal bed mobility: Needs Assistance Bed Mobility: Supine to Sit     Supine to sit: Contact guard, HOB elevated, Used rails     General bed mobility comments: cues for ecouragement to complete task IND as possible increasedd time    Transfers Overall transfer level: Needs assistance Equipment used: Rolling walker (2 wheels) Transfers: Sit to/from Stand Sit to Stand: Min assist           General transfer comment: cues for power up with pt pull to stand from EOB once standing CGA for balance with B UE support  Ambulation/Gait Ambulation/Gait assistance: Contact guard assist Gait Distance (Feet): 40 Feet Assistive device: Rolling walker (2 wheels) Gait Pattern/deviations: Decreased step length - right, Decreased step length - left, Trunk flexed Gait velocity: decreased     General Gait Details: min cues for posture, proper distance from RW and safety with RW navigation in personal room  Stairs            Wheelchair Mobility     Tilt Bed    Modified Rankin (Stroke Patients Only)       Balance Overall  balance assessment: History of Falls, Needs assistance (minimum of 3 falls in the past 6 months) Sitting-balance support: Feet supported Sitting balance-Leahy Scale: Good     Standing balance support: Bilateral upper extremity supported, During functional activity, Reliant on assistive device for balance Standing balance-Leahy Scale: Poor                               Pertinent Vitals/Pain Pain Assessment Pain Assessment: No/denies pain (pt directly reported no LBP at time of eval)    Home Living Family/patient expects to be discharged to:: Private residence Living Arrangements: Other (Comment) (daughter is staying with pt at night) Available Help at Discharge: Family;Available 24 hours/day Type of Home: House Home Access: Stairs to enter Entrance Stairs-Rails: Right Entrance Stairs-Number of Steps: 4   Home Layout: One level Home Equipment: Cane - single point;Educational psychologist (4 wheels) Additional Comments: pt appears to be slightly confused and exhibited difficulty providing insight to PLOF and home set up with a majority information in this Eval from prior admission    Prior Function Prior Level of Function : Needs assist;Independent/Modified Independent             Mobility Comments: IND with rollator for household mobility per pt, pt has frequent falls and requires A for recovery ADLs Comments: pt is mod I for ALDs, self care tasks and requires A from daughter for IADLs and household management     Extremity/Trunk Assessment        Lower Extremity Assessment Lower Extremity Assessment: Generalized weakness    Cervical / Trunk Assessment Cervical / Trunk Assessment: Normal  Communication   Communication Communication: Hearing impairment (increased volume)  Cognition Arousal: Alert Behavior During Therapy: WFL for tasks assessed/performed Overall Cognitive Status: Impaired/Different from baseline Area of Impairment: Memory, Safety/judgement,  Awareness                     Memory: Decreased short-term memory   Safety/Judgement: Decreased awareness of safety     General Comments: pt reported it was Friday and November able to communicate at [facility] hospital        General Comments General comments (skin integrity, edema, etc.): B LE distal pitting edema, R LE and UE as well as L side of forehead with abrasions    Exercises     Assessment/Plan    PT Assessment Patient needs continued PT services  PT Problem List Decreased strength;Decreased activity tolerance;Decreased balance;Decreased mobility;Decreased coordination;Decreased cognition;Cardiopulmonary status limiting activity       PT Treatment Interventions DME instruction;Gait training;Stair training;Functional mobility training;Therapeutic activities;Therapeutic exercise;Balance training;Neuromuscular re-education;Patient/family education    PT Goals (Current goals can be found in the Care Plan section)  Acute Rehab PT Goals Patient Stated Goal: to be able to get well PT Goal Formulation: With patient Time For Goal Achievement: 02/03/23 Potential to Achieve Goals: Good    Frequency Min  1X/week     Co-evaluation               AM-PAC PT "6 Clicks" Mobility  Outcome Measure Help needed turning from your back to your side while in a flat bed without using bedrails?: A Little Help needed moving from lying on your back to sitting on the side of a flat bed without using bedrails?: A Little Help needed moving to and from a bed to a chair (including a wheelchair)?: A Little Help needed standing up from a chair using your arms (e.g., wheelchair or bedside chair)?: A Little Help needed to walk in hospital room?: A Little Help needed climbing 3-5 steps with a railing? : Total 6 Click Score: 16    End of Session Equipment Utilized During Treatment: Gait belt;Oxygen Activity Tolerance: Patient limited by fatigue Patient left: in chair;with call bell/phone  within reach Nurse Communication: Mobility status PT Visit Diagnosis: Unsteadiness on feet (R26.81);Repeated falls (R29.6);Difficulty in walking, not elsewhere classified (R26.2)    Time: 1031-1110 PT Time Calculation (min) (ACUTE ONLY): 39 min   Charges:   PT Evaluation $PT Eval Low Complexity: 1 Low PT Treatments $Gait Training: 8-22 mins $Therapeutic Activity: 8-22 mins PT General Charges $$ ACUTE PT VISIT: 1 Visit         Johnny Bridge, PT Acute Rehab   Jacqualyn Posey 01/20/2023, 11:18 AM

## 2023-01-20 NOTE — Evaluation (Signed)
Occupational Therapy Evaluation Patient Details Name: [name] MRN: [id number] DOB: May 16, 1945 Today's Date: 01/20/2023   History of Present Illness 77 yo male presents to therapy following hospitalization on 01/18/2023 due to progressive SOB, fatigue and LE edema. Pt sustained recent fall and since that time c/o LBP with significant fall hx and syncope/near syncope episodes. Pt has been declining cognitively and with IND with functional mobility tasks over the past 2 months with 2 recent hospitalizations-11/3 and 5/7. Pt was found to have small pleural effusion with hypoxia suspect volume overload with acute on chronic dCHF. Pt negative for DVT and PE. Pt has PMH including but not limited to: lung ca s/p partial pneumectomy on R, renal ca s/p nephrectomy, CKD III, PAF CAD, GERD, HTN, and HLD.   Clinical Impression   Pt typically walks with a rollator. He can bathe, toilet, groom and self feed and is assisted for LB dressing and all IADLs at baseline per his daughter, who is bedside. His two daughters have been taking turns staying with him x 2 months. Pt was initially lethargic, became more alert with O2 returned, SpO2 was 83% on RA, 96% on 2L. Pt presents with generalized weakness, decreased activity tolerance, decreased standing balance and impaired cognition. He needs up to min assist for mobility and set up to moderate assist for ADLs. Per daughter, pt could benefit from a travel chair for MD appointments, reports he typically only goes out for MD appointments. Recommending HHOT upon discharge. Pt is eager to return home.       If plan is discharge home, recommend the following: A little help with walking and/or transfers;A lot of help with bathing/dressing/bathroom;Assistance with cooking/housework;Direct supervision/assist for medications management;Direct supervision/assist for financial management;Assist for transportation;Help with stairs or ramp for entrance    Functional Status  Assessment  Patient has had a recent decline in their functional status and demonstrates the ability to make significant improvements in function in a reasonable and predictable amount of time.  Equipment Recommendations  Other (comment) (travel chair if insurance will cover)    Recommendations for Other Services       Precautions / Restrictions Precautions Precautions: Fall Precaution Comments: watch BP Restrictions Weight Bearing Restrictions: No      Mobility Bed Mobility               General bed mobility comments: received in chair    Transfers Overall transfer level: Needs assistance Equipment used: Rolling walker (2 wheels) Transfers: Sit to/from Stand Sit to Stand: Min assist           General transfer comment: assist to rise and steady      Balance Overall balance assessment: History of Falls, Needs assistance Sitting-balance support: Feet supported Sitting balance-Leahy Scale: Good     Standing balance support: Bilateral upper extremity supported, During functional activity, Reliant on assistive device for balance Standing balance-Leahy Scale: Poor                             ADL either performed or assessed with clinical judgement   ADL Overall ADL's : Needs assistance/impaired Eating/Feeding: Set up;Sitting   Grooming: Set up;Sitting   Upper Body Bathing: Minimal assistance;Sitting   Lower Body Bathing: Moderate assistance;Sit to/from stand   Upper Body Dressing : Minimal assistance;Sitting   Lower Body Dressing: Moderate assistance;Sit to/from stand   Toilet Transfer: Minimal assistance;Rolling walker (2 wheels)  Functional mobility during ADLs: Rolling walker (2 wheels);Minimal assistance       Vision Ability to See in Adequate Light: 0 Adequate Patient Visual Report: No change from baseline       Perception         Praxis         Pertinent Vitals/Pain Pain Assessment Pain Assessment:  No/denies pain     Extremity/Trunk Assessment Upper Extremity Assessment Upper Extremity Assessment: Generalized weakness   Lower Extremity Assessment Lower Extremity Assessment: Defer to PT evaluation   Cervical / Trunk Assessment Cervical / Trunk Assessment: Normal   Communication Communication Communication: Hearing impairment   Cognition Arousal: Alert Behavior During Therapy: Flat affect Overall Cognitive Status: Impaired/Different from baseline Area of Impairment: Memory, Safety/judgement, Awareness                     Memory: Decreased short-term memory   Safety/Judgement: Decreased awareness of safety     General Comments: daughter assisting with PLOF, pt highly focused on wanting to go home, had removed his O2     General Comments  B LE distal pitting edema, R LE and UE as well as L side of forehead with abrasions    Exercises     Shoulder Instructions      Home Living Family/patient expects to be discharged to:: Private residence Living Arrangements: Alone Available Help at Discharge: Family;Available 24 hours/day (daughters take turns staying with pt x 2 months) Type of Home: House Home Access: Stairs to enter [employer] of Steps: 4 Entrance Stairs-Rails: Right Home Layout: One level     Bathroom Shower/Tub: Walk-in shower;Door   [employer] Toilet: Standard Bathroom Accessibility: Yes   Home Equipment: Cane - single point;Educational psychologist (4 wheels)   Additional Comments: daughter assisting with PLOF      Prior Functioning/Environment Prior Level of Function : Needs assist;Independent/Modified Independent             Mobility Comments: IND with rollator for household mobility per pt, pt has frequent falls and requires A for recovery ADLs Comments: assisted for LB dressing, can bathe himself, daughters help with meal prep, housekeeping, meal prep and med management        OT Problem List: Decreased  strength;Decreased activity tolerance;Impaired balance (sitting and/or standing)      OT Treatment/Interventions: Self-care/ADL training;DME and/or AE instruction;Therapeutic activities;Patient/family education;Balance training    OT Goals(Current goals can be found in the care plan section) Acute Rehab OT Goals OT Goal Formulation: With patient/family Time For Goal Achievement: 02/03/23 Potential to Achieve Goals: Good ADL Goals Pt Will Perform Grooming: with supervision;standing Pt Will Perform Upper Body Bathing: with supervision;sitting Pt Will Perform Lower Body Bathing: with supervision;sit to/from stand Pt Will Transfer to Toilet: with supervision;ambulating;bedside commode Pt Will Perform Toileting - Clothing Manipulation and hygiene: with supervision;sit to/from stand Pt/caregiver will Perform Home Exercise Program: Increased strength;Both right and left upper extremity;With theraband;With Supervision;With written HEP provided  OT Frequency: Min 1X/week    Co-evaluation              AM-PAC OT "6 Clicks" Daily Activity     Outcome Measure Help from another person eating meals?: A Little Help from another person taking care of personal grooming?: A Little Help from another person toileting, which includes using toliet, bedpan, or urinal?: A Little Help from another person bathing (including washing, rinsing, drying)?: A Lot Help from another person to put on and taking off regular upper body clothing?: A Little  Help from another person to put on and taking off regular lower body clothing?: A Lot 6 Click Score: 16   End of Session Equipment Utilized During Treatment: Oxygen  Activity Tolerance: Patient limited by fatigue Patient left: in chair;with call bell/phone within reach;with chair alarm set;with family/visitor present  OT Visit Diagnosis: Unsteadiness on feet (R26.81);Other abnormalities of gait and mobility (R26.89);Muscle weakness (generalized) (M62.81);Other  symptoms and signs involving cognitive function                Time: 6045-4098 OT Time Calculation (min): 22 min Charges:  OT General Charges $OT Visit: 1 Visit OT Evaluation $OT Eval Moderate Complexity: 1 Mod Berna Spare, OTR/L Acute Rehabilitation Services Office: [phone]  Evern Bio 01/20/2023, 1:48 PM

## 2023-01-20 NOTE — Plan of Care (Signed)

## 2023-01-20 NOTE — Hospital Course (Signed)
[name] is a 77 yo male with PMH Stage IV RCC s/p right nephrectomy (mets to liver and omentum 2019). He is followed closely by oncology and has been on multiple treatment regimens since diagnosis. He presented to the hospital with shortness of breath, hypoxia, and worsening lower extremity edema. He was started on diuresis and admitted for volume overload.

## 2023-01-20 NOTE — Progress Notes (Signed)
Progress Note    [name]   ONG:[id number]  DOB: 1946/02/04  DOA: 01/18/2023     2 PCP: [name], MD  Initial CC: Shortness of breath, lower extremity edema  Hospital Course: [name] is a 77 yo male with PMH Stage IV RCC s/p right nephrectomy (mets to liver and omentum 2019). He is followed closely by oncology and has been on multiple treatment regimens since diagnosis. He presented to the hospital with shortness of breath, hypoxia, and worsening lower extremity edema. He was started on diuresis and admitted for volume overload.  Interval History:  Daughter present bedside this morning.  Patient has no complaints when seen.  Breathing appears improved.  Sitting in recliner this morning.  Assessment and Plan: * Pleural effusion Bilateral pleural effusion-small Presents with shortness of breath as well as volume overload. Has acutely worsening lower extremity edema. Dopplers are negative for DVT as well as CT PE protocol negative for PE. No evidence of pneumonia. Recent echocardiogram in November 2024 shows EF of 65%.  Diastolic dysfunction.  No significant valvular abnormality. - continue lasix and monitor response  Hypokalemia - Severely depleted on admission - Has responded well to repletion - Continue trending  Acute respiratory failure with hypoxia (HCC) - Suspected due to third spacing and underlying pleural effusions - Continue diuresis - Will try to wean from oxygen prior to discharge but have informed daughter and patient he may need home O2  Euthyroid sick syndrome - TSH 10.148 - Free T4 essentially normal, even mildly elevated, 1.16 - home dose has fluctuated between 150 to 175 recently but recent fill was for 175; since FT4 is ULN, need to reduce back to 150 mcg and repeat studies outpatient once he is back to normal baseline - can recheck TSH/T4 outpatient in approx 6 weeks  Back pain - Fall approximately 4 days prior to admission with  complaints of low back pain - Lumbar x-ray obtained 01/18/2023 showing no acute injuries - Continue observation for now and if worsening pain, can consider further imaging  Paroxysmal atrial fibrillation (HCC) - Eliquis has been discontinued in setting of recurrent falls - Continue Cardizem and metoprolol  Secondary adrenal insufficiency (HCC) - continue home regimen - stress dosing if/when indicated  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 Consider onc input on this medication as needed   Old records reviewed in assessment of this patient  Antimicrobials:   DVT prophylaxis:  SCDs Start: 01/18/23 1759   Code Status:   Code Status: Full Code  Mobility Assessment (Last 72 Hours)     Mobility Assessment     Row Name 01/20/23 1100 01/20/23 0830 01/19/23 2018 01/19/23 1600 01/18/23 2136   Does patient have an order for bedrest or is patient medically unstable -- Yes- Bedfast (Level 1) - Complete Yes- Bedfast (Level 1) - Complete Yes- Bedfast (Level 1) - Complete Yes- Bedfast (Level 1) - Complete   What is the highest level of mobility based on the progressive mobility assessment? Level 4 (Walks with assist in room) - Balance while marching in place and cannot step forward and back - Complete Level 4 (Walks with assist in room) - Balance while marching in place and cannot step forward and back - Complete -- -- --            Barriers to discharge: none Disposition Plan: Home Status is: Inpatient  Objective: Blood pressure (!) 112/56, pulse 77, temperature 97.8 F (  36.6 C), temperature source Oral, resp. rate 18, height 5\' 8"  (1.727 m), weight 73.7 kg, SpO2 100%.  Examination:  Physical Exam Constitutional:      General: He is not in acute distress.    Comments: Fatigued appearing  HENT:     Head: Normocephalic and atraumatic.     Mouth/Throat:     Mouth: Mucous membranes are moist.  Eyes:     Extraocular  Movements: Extraocular movements intact.  Cardiovascular:     Rate and Rhythm: Normal rate and regular rhythm.  Pulmonary:     Effort: Pulmonary effort is normal. No respiratory distress.     Breath sounds: Normal breath sounds. No wheezing.  Abdominal:     General: Bowel sounds are normal. There is no distension.     Palpations: Abdomen is soft.     Tenderness: There is no abdominal tenderness.  Musculoskeletal:        General: Swelling present. Normal range of motion.     Cervical back: Normal range of motion and neck supple.  Skin:    General: Skin is warm and dry.  Neurological:     General: No focal deficit present.     Mental Status: He is alert.  Psychiatric:        Mood and Affect: Mood normal.        Behavior: Behavior normal.      Consultants:    Procedures:    Data Reviewed: Results for orders placed or performed during the hospital encounter of 01/18/23 (from the past 24 hour(s))  Magnesium     Status: None   Collection Time: 01/20/23  6:06 AM  Result Value Ref Range   Magnesium 2.0 1.7 - 2.4 mg/dL  Comprehensive metabolic panel     Status: Abnormal   Collection Time: 01/20/23  6:06 AM  Result Value Ref Range   Sodium 140 135 - 145 mmol/L   Potassium 3.2 (L) 3.5 - 5.1 mmol/L   Chloride 97 (L) 98 - 111 mmol/L   CO2 33 (H) 22 - 32 mmol/L   Glucose, Bld 134 (H) 70 - 99 mg/dL   BUN 11 8 - 23 mg/dL   Creatinine, Ser 9.56 0.61 - 1.24 mg/dL   Calcium 7.8 (L) 8.9 - 10.3 mg/dL   Total Protein 5.6 (L) 6.5 - 8.1 g/dL   Albumin 2.1 (L) 3.5 - 5.0 g/dL   AST 15 15 - 41 U/L   ALT 12 0 - 44 U/L   Alkaline Phosphatase 159 (H) 38 - 126 U/L   Total Bilirubin 0.5 <1.2 mg/dL   GFR, Estimated >21 >30 mL/min   Anion gap 10 5 - 15  CBC with Differential/Platelet     Status: Abnormal   Collection Time: 01/20/23  6:06 AM  Result Value Ref Range   WBC 18.2 (H) 4.0 - 10.5 K/uL   RBC 3.04 (L) 4.22 - 5.81 MIL/uL   Hemoglobin 7.5 (L) 13.0 - 17.0 g/dL   HCT 86.5 (L) 78.4 -  52.0 %   MCV 90.5 80.0 - 100.0 fL   MCH 24.7 (L) 26.0 - 34.0 pg   MCHC 27.3 (L) 30.0 - 36.0 g/dL   RDW 69.6 (H) 29.5 - 28.4 %   Platelets 331 150 - 400 K/uL   nRBC 0.0 0.0 - 0.2 %   Neutrophils Relative % 86 %   Neutro Abs 15.7 (H) 1.7 - 7.7 K/uL   Lymphocytes Relative 7 %   Lymphs Abs 1.3 0.7 - 4.0 K/uL  Monocytes Relative 6 %   Monocytes Absolute 1.0 0.1 - 1.0 K/uL   Eosinophils Relative 0 %   Eosinophils Absolute 0.1 0.0 - 0.5 K/uL   Basophils Relative 0 %   Basophils Absolute 0.0 0.0 - 0.1 K/uL   Immature Granulocytes 1 %   Abs Immature Granulocytes 0.14 (H) 0.00 - 0.07 K/uL    I have reviewed pertinent nursing notes, vitals, labs, and images as necessary. I have ordered labwork to follow up on as indicated.  I have reviewed the last notes from staff over past 24 hours. I have discussed patient's care plan and test results with nursing staff, CM/SW, and other staff as appropriate.  Time spent: Greater than 50% of the 55 minute visit was spent in counseling/coordination of care for the patient as laid out in the A&P.   LOS: 2 days   Lewie Chamber, MD [facility] 01/20/2023, 5:23 PM

## 2023-01-20 NOTE — Progress Notes (Signed)
The patient was sitting up in the chair. Chair alarm placed due to fall precaution score.

## 2023-01-20 NOTE — Assessment & Plan Note (Signed)
-   Eliquis has been discontinued in setting of recurrent falls - Continue Cardizem and metoprolol

## 2023-01-21 ENCOUNTER — Ambulatory Visit: Payer: Medicare HMO | Admitting: Student

## 2023-01-21 DIAGNOSIS — J90 Pleural effusion, not elsewhere classified: Secondary | ICD-10-CM | POA: Diagnosis not present

## 2023-01-21 DIAGNOSIS — D649 Anemia, unspecified: Secondary | ICD-10-CM

## 2023-01-21 LAB — BASIC METABOLIC PANEL
Anion gap: 10 (ref 5–15)
BUN: 11 mg/dL (ref 8–23)
CO2: 33 mmol/L — ABNORMAL HIGH (ref 22–32)
Calcium: 8.1 mg/dL — ABNORMAL LOW (ref 8.9–10.3)
Chloride: 99 mmol/L (ref 98–111)
Creatinine, Ser: 0.9 mg/dL (ref 0.61–1.24)
GFR, Estimated: >60 mL/min (ref >60)
Glucose, Bld: 134 mg/dL — ABNORMAL HIGH (ref 70–99)
Potassium: 3.4 mmol/L — ABNORMAL LOW (ref 3.5–5.1)
Sodium: 142 mmol/L (ref 135–145)

## 2023-01-21 LAB — CBC WITH DIFFERENTIAL/PLATELET
Abs Immature Granulocytes: 0.08 10*3/uL — ABNORMAL HIGH (ref 0.00–0.07)
Basophils Absolute: 0 10*3/uL (ref 0.0–0.1)
Basophils Relative: 0 %
Eosinophils Absolute: 0.1 10*3/uL (ref 0.0–0.5)
Eosinophils Relative: 1 %
HCT: 24.8 % — ABNORMAL LOW (ref 39.0–52.0)
Hemoglobin: 6.8 g/dL — CL (ref 13.0–17.0)
Immature Granulocytes: 1 %
Lymphocytes Relative: 10 %
Lymphs Abs: 1.4 10*3/uL (ref 0.7–4.0)
MCH: 24.5 pg — ABNORMAL LOW (ref 26.0–34.0)
MCHC: 27.4 g/dL — ABNORMAL LOW (ref 30.0–36.0)
MCV: 89.5 fL (ref 80.0–100.0)
Monocytes Absolute: 1.2 10*3/uL — ABNORMAL HIGH (ref 0.1–1.0)
Monocytes Relative: 9 %
Neutro Abs: 11.3 10*3/uL — ABNORMAL HIGH (ref 1.7–7.7)
Neutrophils Relative %: 79 %
Platelets: 285 10*3/uL (ref 150–400)
RBC: 2.77 MIL/uL — ABNORMAL LOW (ref 4.22–5.81)
RDW: 19.6 % — ABNORMAL HIGH (ref 11.5–15.5)
WBC: 14.1 10*3/uL — ABNORMAL HIGH (ref 4.0–10.5)
nRBC: 0 % (ref 0.0–0.2)

## 2023-01-21 LAB — PREPARE RBC (CROSSMATCH)

## 2023-01-21 LAB — MAGNESIUM: Magnesium: 2.1 mg/dL (ref 1.7–2.4)

## 2023-01-21 MED ORDER — POTASSIUM CHLORIDE CRYS ER 20 MEQ PO TBCR
40.0000 meq | EXTENDED_RELEASE_TABLET | Freq: Once | ORAL | Status: AC
  Administered 2023-01-21: 40 meq via ORAL
  Filled 2023-01-21: qty 2

## 2023-01-21 MED ORDER — SODIUM CHLORIDE 0.9% IV SOLUTION: Freq: Once | INTRAVENOUS | Status: AC

## 2023-01-21 NOTE — Consult Note (Signed)
[facility] Liaison Consult Note    01/21/2023  Samauri Hausladen Jul 14, 1945 [id number]  Insurance: [employer]   Primary Care Provider: [name], MD, with [facility], currently this provider is not noted as a VBCI network provider. Called and confirmed that this provider is an independent provider and not part of [facility].   RN Hospital Liaison screened the patient remotely at [facility].  Reviewed on high risk list with less than 30 days readmission    The patient was screened for 30 day readmission hospitalization with noted high risk score for unplanned readmission risk 3 hospital admissions in 6 months.  The patient was assessed for potential Community Care Coordination service needs for post hospital transition for care coordination. Review of patient's electronic medical record reveals patient is followed closely by the Oncology team and Palliative care noted..   Plan: [facility] Liaison will sign off.   Patient to be followed by the Inpatient Transition of Care needs.   For questions contact:   Charlesetta Shanks, RN, BSN, CCM [place]  [facility], [facility] Liaison Direct Dial: [phone] or secure chat Email: [email or web address]

## 2023-01-21 NOTE — Plan of Care (Signed)

## 2023-01-21 NOTE — Assessment & Plan Note (Addendum)
-   Hgb 6.8 g/dL on 78/2.  No overt signs of bleeding.  Suspect etiology in setting of underlying chemo use with bone marrow suppression -1 unit PRBC ordered 12/5 - Hgb improved this am, 8.6 g/dL

## 2023-01-21 NOTE — TOC Initial Note (Signed)
Transition of Care [facility]) - Initial/Assessment Note    Patient Details  Name: [name] MRN: [id number] Date of Birth: 1945/04/10  Transition of [facility]) CM/SW Contact:    Beckie Busing, RN Phone Number:[phone]  01/21/2023, 7:30 PM  Clinical Narrative:                 TOC acknowledges patient with high risk for readmission. Patient is from home. Currently patient is confused and unable to answer a lot of health history information. Patient does have PCP listed in health history and daughter has previously been able to answer health care questions. TOC will continue to follow for any disposition needs.   Expected Discharge Plan: Home/Self Care Barriers to Discharge: No Barriers Identified   Patient Goals and CMS Choice Patient states their goals for this hospitalization and ongoing recovery are:: Wants to get better to go home   Choice offered to / list presented to : NA      Expected Discharge Plan and Services In-house Referral: NA Discharge Planning Services: CM Consult Post Acute Care Choice: NA Living arrangements for the past 2 months: Single Family Home                 DME Arranged: N/A DME Agency: NA       HH Arranged: NA HH Agency: NA        Prior Living Arrangements/Services Living arrangements for the past 2 months: Single Family Home Lives with:: Self Patient language and need for interpreter reviewed:: Yes Do you feel safe going back to the place where you live?: Yes      Need for Family Participation in Patient Care: No (Comment) Care giver support system in place?: Yes (comment) Current home services: DME Criminal Activity/Legal Involvement Pertinent to Current Situation/Hospitalization: No - Comment as needed  Activities of Daily Living   ADL Screening (condition at time of admission) Independently performs ADLs?: Yes (appropriate for developmental age) Is the patient deaf or have difficulty hearing?: Yes Does the patient have difficulty  seeing, even when wearing glasses/contacts?: No Does the patient have difficulty concentrating, remembering, or making decisions?: No  Permission Sought/Granted   Permission granted to share information with : No              Emotional Assessment Appearance:: Appears older than stated age   Affect (typically observed): Pleasant Orientation: : Oriented to Self Alcohol / Substance Use: Not Applicable Psych Involvement: No (comment)  Admission diagnosis:  Hypokalemia [E87.6] Peripheral edema [R60.0] Pleural effusion [J90] FTT (failure to thrive) in adult [R62.7] Acute respiratory failure with hypoxia (HCC) [J96.01] Metastatic malignant neoplasm, unspecified site [facility]) [C79.9] Patient Active Problem List   Diagnosis Date Noted   Normocytic anemia 01/21/2023   Acute respiratory failure with hypoxia (HCC) 01/20/2023   Pleural effusion 01/18/2023   Back pain 01/18/2023   Euthyroid sick syndrome 01/18/2023   Malnutrition of moderate degree 12/22/2022   Head injury 12/21/2022   Syncope 12/20/2022   Thrombocytosis 12/20/2022   Frequent falls 12/20/2022   Hypomagnesemia 12/20/2022   Grade I diastolic dysfunction 12/20/2022   Hypokalemia 12/09/2022   Iron deficiency anemia 12/08/2022   SIRS (systemic inflammatory response syndrome) (HCC) 12/08/2022   Paroxysmal atrial fibrillation (HCC) 12/08/2022   Anxiety 06/26/2022   Adrenal disease (HCC) 06/25/2022   Medication management 06/25/2022   Anemia of chronic disease 06/24/2022   Hyponatremia 06/24/2022   Palliative care encounter 06/24/2022   Goals of care, counseling/discussion 06/24/2022   Counseling and coordination  of care 06/24/2022   Need for emotional support 06/24/2022   Poor appetite 06/24/2022   Invasive fungal sinusitis 06/10/2022   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) 06/10/2022   Maxillary sinusitis 05/22/2022   Jaw pain 03/31/2022   Adrenal insufficiency (HCC) 08/23/2018   Atherosclerosis of aorta (HCC)  08/23/2018   Acquired hypothyroidism 04/19/2018   Secondary adrenal insufficiency (HCC) 04/19/2018   Acute hyponatremia 03/24/2018   Essential hypertension    Protein-calorie malnutrition, severe 03/14/2018   Generalized weakness 03/13/2018   Hyperlipidemia 11/30/2017   Kidney cancer, primary, with metastasis from kidney to other site [facility]) 09/14/2017   Recurrent umbilical hernia 08/01/2015   Incisional hernia 08/01/2015   Hypertensive heart disease without CHF    GERD (gastroesophageal reflux disease)    Chronic kidney disease    Coronary artery disease    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 02/01/2015   PCP:  [name], MD Pharmacy:   [facility] - [place], [place] - [address] [place] [address] Phone: [phone] Fax: [phone]     Social Determinants of Health (SDOH) Social History: SDOH Screenings   Food Insecurity: No Food Insecurity (01/18/2023)  Housing: Low Risk  (01/18/2023)  Transportation Needs: No Transportation Needs (01/18/2023)  Utilities: Not At Risk (01/18/2023)  Depression (PHQ2-9): Low Risk  (12/28/2022)  Social Connections: Unknown (06/30/2021)   [facility] from [facility]  Tobacco Use: Medium Risk (01/18/2023)   SDOH Interventions:     Readmission Risk Interventions    01/21/2023    7:26 PM 12/09/2022   11:03 AM  Readmission Risk Prevention Plan  Transportation Screening Complete Complete  PCP or Specialist Appt within 3-5 Days Complete   HRI or Home Care Consult Complete   Social Work Consult for Recovery Care Planning/Counseling Complete   Palliative Care Screening Not Applicable   Medication Review [employer])  Complete  PCP or Specialist appointment within 3-5 days of discharge  Complete  HRI or Home Care Consult  Complete  SW Recovery Care/Counseling Consult  Complete  Palliative Care Screening  Not  Applicable  Skilled Nursing Facility  Not Applicable

## 2023-01-21 NOTE — Progress Notes (Signed)
Progress Note    [name]   OBS:[id number]  DOB: October 30, 1945  DOA: 01/18/2023     3 PCP: [name], MD  Initial CC: Shortness of breath, lower extremity edema  Hospital Course: [name] is a 77 yo male with PMH Stage IV RCC s/p right nephrectomy (mets to liver and omentum 2019). He is followed closely by oncology and has been on multiple treatment regimens since diagnosis. He presented to the hospital with shortness of breath, hypoxia, and worsening lower extremity edema. He was started on diuresis and admitted for volume overload.  Interval History:  Overall about the same. Quiet with no complaints. Still pretty deconditioned appearing. I asked him if he'd want rehab and he said no and that he would want to return home.  He usually ambulates at home with a walker independently. Hemoglobin also dropped some this morning and he is getting 1 unit of blood today.  Daughter present bedside this morning and also updated.  Assessment and Plan: * Pleural effusion Bilateral pleural effusion-small Presents with shortness of breath as well as volume overload. Has acutely worsening lower extremity edema. Dopplers are negative for DVT as well as CT PE protocol negative for PE. No evidence of pneumonia. Recent echocardiogram in November 2024 shows EF of 65%.  Diastolic dysfunction.  No significant valvular abnormality. - continue lasix and monitor response  Hypokalemia - Severely depleted on admission - Has responded well to repletion - Continue trending  Normocytic anemia - Hgb 6.8 g/dL this morning.  No overt signs of bleeding.  Suspect etiology in setting of underlying chemo use with bone marrow suppression -1 unit PRBC ordered today  Acute respiratory failure with hypoxia (HCC) - Suspected due to third spacing and underlying pleural effusions - Continue diuresis - Will try to wean from oxygen prior to discharge but have informed daughter and patient he may need home  O2  Euthyroid sick syndrome - TSH 10.148 - Free T4 essentially normal, even mildly elevated, 1.16 - home dose has fluctuated between 150 to 175 recently but recent fill was for 175; since FT4 is ULN, need to reduce back to 150 mcg and repeat studies outpatient once he is back to normal baseline - can recheck TSH/T4 outpatient in approx 6 weeks  Back pain - Fall approximately 4 days prior to admission with complaints of low back pain - Lumbar x-ray obtained 01/18/2023 showing no acute injuries - Continue observation for now and if worsening pain, can consider further imaging  Paroxysmal atrial fibrillation (HCC) - Eliquis has been discontinued in setting of recurrent falls - Continue Cardizem and metoprolol  Secondary adrenal insufficiency (HCC) - continue home regimen - stress dosing if/when indicated  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 Patient on belzutifan as outpatient. Consider onc input on this medication as needed   Old records reviewed in assessment of this patient  Antimicrobials:   DVT prophylaxis:  SCDs Start: 01/18/23 1759   Code Status:   Code Status: Full Code  Mobility Assessment (Last 72 Hours)     Mobility Assessment     Row Name 01/21/23 0902 01/20/23 1913 01/20/23 1100 01/20/23 0830 01/19/23 2018   Does patient have an order for bedrest or is patient medically unstable Yes- Bedfast (Level 1) - Complete Yes- Bedfast (Level 1) - Complete -- Yes- Bedfast (Level 1) - Complete Yes- Bedfast (Level 1) - Complete   What is the highest level of mobility based on the progressive mobility assessment? Level  4 (Walks with assist in room) - Balance while marching in place and cannot step forward and back - Complete Level 4 (Walks with assist in room) - Balance while marching in place and cannot step forward and back - Complete Level 4 (Walks with assist in room) - Balance while marching in place and cannot step forward and back - Complete  Level 4 (Walks with assist in room) - Balance while marching in place and cannot step forward and back - Complete --    Row Name 01/19/23 1600 01/18/23 2136         Does patient have an order for bedrest or is patient medically unstable Yes- Bedfast (Level 1) - Complete Yes- Bedfast (Level 1) - Complete               Barriers to discharge: none Disposition Plan: Home Status is: Inpatient  Objective: Blood pressure 120/64, pulse 81, temperature 97.8 F (36.6 C), temperature source Oral, resp. rate 18, height 5\' 8"  (1.727 m), weight 73 kg, SpO2 92%.  Examination:  Physical Exam Constitutional:      General: He is not in acute distress.    Comments: Fatigued appearing  HENT:     Head: Normocephalic and atraumatic.     Mouth/Throat:     Mouth: Mucous membranes are moist.  Eyes:     Extraocular Movements: Extraocular movements intact.  Cardiovascular:     Rate and Rhythm: Normal rate and regular rhythm.  Pulmonary:     Effort: Pulmonary effort is normal. No respiratory distress.     Breath sounds: Normal breath sounds. No wheezing.  Abdominal:     General: Bowel sounds are normal. There is no distension.     Palpations: Abdomen is soft.     Tenderness: There is no abdominal tenderness.  Musculoskeletal:        General: Swelling present. Normal range of motion.     Cervical back: Normal range of motion and neck supple.  Skin:    General: Skin is warm and dry.  Neurological:     General: No focal deficit present.     Mental Status: He is alert.  Psychiatric:        Mood and Affect: Mood normal.        Behavior: Behavior normal.      Consultants:    Procedures:    Data Reviewed: Results for orders placed or performed during the hospital encounter of 01/18/23 (from the past 24 hour(s))  Basic metabolic panel     Status: Abnormal   Collection Time: 01/21/23  6:56 AM  Result Value Ref Range   Sodium 142 135 - 145 mmol/L   Potassium 3.4 (L) 3.5 - 5.1 mmol/L    Chloride 99 98 - 111 mmol/L   CO2 33 (H) 22 - 32 mmol/L   Glucose, Bld 134 (H) 70 - 99 mg/dL   BUN 11 8 - 23 mg/dL   Creatinine, Ser 6.57 0.61 - 1.24 mg/dL   Calcium 8.1 (L) 8.9 - 10.3 mg/dL   GFR, Estimated >84 >69 mL/min   Anion gap 10 5 - 15  CBC with Differential/Platelet     Status: Abnormal   Collection Time: 01/21/23  6:56 AM  Result Value Ref Range   WBC 14.1 (H) 4.0 - 10.5 K/uL   RBC 2.77 (L) 4.22 - 5.81 MIL/uL   Hemoglobin 6.8 (LL) 13.0 - 17.0 g/dL   HCT 62.9 (L) 52.8 - 41.3 %   MCV 89.5 80.0 - 100.0  fL   MCH 24.5 (L) 26.0 - 34.0 pg   MCHC 27.4 (L) 30.0 - 36.0 g/dL   RDW 16.1 (H) 09.6 - 04.5 %   Platelets 285 150 - 400 K/uL   nRBC 0.0 0.0 - 0.2 %   Neutrophils Relative % 79 %   Neutro Abs 11.3 (H) 1.7 - 7.7 K/uL   Lymphocytes Relative 10 %   Lymphs Abs 1.4 0.7 - 4.0 K/uL   Monocytes Relative 9 %   Monocytes Absolute 1.2 (H) 0.1 - 1.0 K/uL   Eosinophils Relative 1 %   Eosinophils Absolute 0.1 0.0 - 0.5 K/uL   Basophils Relative 0 %   Basophils Absolute 0.0 0.0 - 0.1 K/uL   Immature Granulocytes 1 %   Abs Immature Granulocytes 0.08 (H) 0.00 - 0.07 K/uL  Magnesium     Status: None   Collection Time: 01/21/23  6:56 AM  Result Value Ref Range   Magnesium 2.1 1.7 - 2.4 mg/dL  Prepare RBC (crossmatch)     Status: None   Collection Time: 01/21/23 10:23 AM  Result Value Ref Range   Order Confirmation      ORDER PROCESSED BY BLOOD BANK Performed at [facility], [address], [place], [place] [address]   Type and screen [facility]     Status: None (Preliminary result)   Collection Time: 01/21/23 10:25 AM  Result Value Ref Range   ABO/RH(D) B POS    Antibody Screen NEG    Sample Expiration 01/24/2023,2359    Unit Number [id number]    Blood Component Type RBC LR PHER2    Unit division 00    Status of Unit ISSUED    Transfusion Status OK TO TRANSFUSE    Crossmatch Result      Compatible Performed at [facility], [address], [place], [place] [address]     I have reviewed pertinent nursing notes, vitals, labs, and images as necessary. I have ordered labwork to follow up on as indicated.  I have reviewed the last notes from staff over past 24 hours. I have discussed patient's care plan and test results with nursing staff, CM/SW, and other staff as appropriate.  Time spent: Greater than 50% of the 55 minute visit was spent in counseling/coordination of care for the patient as laid out in the A&P.   LOS: 3 days   Lewie Chamber, MD [facility] 01/21/2023, 3:44 PM

## 2023-01-22 DIAGNOSIS — E876 Hypokalemia: Secondary | ICD-10-CM | POA: Diagnosis not present

## 2023-01-22 DIAGNOSIS — J9601 Acute respiratory failure with hypoxia: Secondary | ICD-10-CM | POA: Diagnosis not present

## 2023-01-22 DIAGNOSIS — J90 Pleural effusion, not elsewhere classified: Secondary | ICD-10-CM | POA: Diagnosis not present

## 2023-01-22 LAB — BASIC METABOLIC PANEL
Anion gap: 9 (ref 5–15)
BUN: 11 mg/dL (ref 8–23)
CO2: 33 mmol/L — ABNORMAL HIGH (ref 22–32)
Calcium: 8.1 mg/dL — ABNORMAL LOW (ref 8.9–10.3)
Chloride: 98 mmol/L (ref 98–111)
Creatinine, Ser: 0.97 mg/dL (ref 0.61–1.24)
GFR, Estimated: >60 mL/min (ref >60)
Glucose, Bld: 139 mg/dL — ABNORMAL HIGH (ref 70–99)
Potassium: 3.7 mmol/L (ref 3.5–5.1)
Sodium: 140 mmol/L (ref 135–145)

## 2023-01-22 LAB — CBC WITH DIFFERENTIAL/PLATELET
Abs Immature Granulocytes: 0.16 10*3/uL — ABNORMAL HIGH (ref 0.00–0.07)
Basophils Absolute: 0 10*3/uL (ref 0.0–0.1)
Basophils Relative: 0 %
Eosinophils Absolute: 0.1 10*3/uL (ref 0.0–0.5)
Eosinophils Relative: 1 %
HCT: 31.4 % — ABNORMAL LOW (ref 39.0–52.0)
Hemoglobin: 8.6 g/dL — ABNORMAL LOW (ref 13.0–17.0)
Immature Granulocytes: 1 %
Lymphocytes Relative: 12 %
Lymphs Abs: 2.2 10*3/uL (ref 0.7–4.0)
MCH: 25.2 pg — ABNORMAL LOW (ref 26.0–34.0)
MCHC: 27.4 g/dL — ABNORMAL LOW (ref 30.0–36.0)
MCV: 92.1 fL (ref 80.0–100.0)
Monocytes Absolute: 1.3 10*3/uL — ABNORMAL HIGH (ref 0.1–1.0)
Monocytes Relative: 7 %
Neutro Abs: 14.7 10*3/uL — ABNORMAL HIGH (ref 1.7–7.7)
Neutrophils Relative %: 79 %
Platelets: 387 10*3/uL (ref 150–400)
RBC: 3.41 MIL/uL — ABNORMAL LOW (ref 4.22–5.81)
RDW: 18.7 % — ABNORMAL HIGH (ref 11.5–15.5)
WBC: 18.5 10*3/uL — ABNORMAL HIGH (ref 4.0–10.5)
nRBC: 0 % (ref 0.0–0.2)

## 2023-01-22 LAB — TYPE AND SCREEN
ABO/RH(D): B POS
Antibody Screen: NEGATIVE
Unit division: 0

## 2023-01-22 LAB — BPAM RBC
Blood Product Expiration Date: 202412202359
ISSUE DATE / TIME: 202412051238
Unit Type and Rh: 7300

## 2023-01-22 LAB — MAGNESIUM: Magnesium: 2.1 mg/dL (ref 1.7–2.4)

## 2023-01-22 MED ORDER — ALBUTEROL SULFATE (2.5 MG/3ML) 0.083% IN NEBU
2.5000 mg | INHALATION_SOLUTION | Freq: Four times a day (QID) | RESPIRATORY_TRACT | Status: DC | PRN
  Administered 2023-01-22: 2.5 mg via RESPIRATORY_TRACT
  Filled 2023-01-22: qty 3

## 2023-01-22 MED ORDER — FUROSEMIDE 10 MG/ML IJ SOLN
60.0000 mg | Freq: Every day | INTRAMUSCULAR | Status: DC
  Administered 2023-01-22 – 2023-01-23 (×2): 60 mg via INTRAVENOUS
  Filled 2023-01-22 (×2): qty 6

## 2023-01-22 NOTE — Progress Notes (Signed)
Pt is becoming more altered throughout the night. Pt continues to remove oxygen. Pt was found without [place] on this morning with O2 sats In the 60s. Pt was increased to 4L [place] ans sats increased to 95% Expiratory wheezing noted. On call A. Virgel Manifold was notified of findings and pt appears to be resting at this time, no distress noted.

## 2023-01-22 NOTE — Progress Notes (Addendum)
Physical Therapy Treatment Patient Details Name: [name] MRN: [id number] DOB: [birth year] Today's Date: 01/22/2023   History of Present Illness 77 yo male presents to therapy following hospitalization on 01/18/2023 due to progressive SOB, fatigue and LE edema. Pt sustained recent fall and since that time c/o LBP with significant fall hx and syncope/near syncope episodes. Pt has been declining cognitively and with IND with functional mobility tasks over the past 2 months with 2 recent hospitalizations-11/3 and 5/7. Pt was found to have small pleural effusion with hypoxia suspect volume overload with acute on chronic dCHF. Pt negative for DVT and PE. Pt has PMH including but not limited to: lung ca s/p partial pneumectomy on R, renal ca s/p nephrectomy, CKD III, PAF CAD, GERD, HTN, and HLD. Pt required transfusion 1 PRBC on 12/5.  Lumbar spine imaging 12/2 negative for acute findings.     PT Comments  Pt admitted with above diagnosis.  Pt currently with functional limitations due to the deficits listed below (see PT Problem List). Pt in bed resting when PT arrived. Pt on 4 L/min and 98% at rest with [place] in place. Pt reported need to void. Pt required mod A, cues and increased time with supine to sit with HOB elevated, min A for sit to stand  from EOB to RW, min/mod A for sit to stand  from standard commode with use of L grab bar, gait to and from bathroom with RW, min A for RW management, cues with pt demonstrating flexed posture, short stride length and limited foot clearance with slow cadence. Pt on 4 L/min throughout intervention and desaturated with exertion to 86%. Pt required cues for pursed lip breathing with limited ability for return demonstration. Pt seated in recliner and proceeded to remove  tube and quickly desaturated to 78%, PT replaced and provided ed on need for supplemental O2 at this time. Pt left seated in recliner, all needs in place and 90% on 4 L/min.  Pt will benefit from acute  skilled PT to increase their independence and safety with mobility to allow discharge.      If plan is discharge home, recommend the following: A little help with walking and/or transfers;A little help with bathing/dressing/bathroom;Assistance with cooking/housework;Assist for transportation;Help with stairs or ramp for entrance   Can travel by private vehicle        Equipment Recommendations  None recommended by PT    Recommendations for Other Services       Precautions / Restrictions Precautions Precautions: Fall Precaution Comments: watch BP Restrictions Weight Bearing Restrictions: No     Mobility  Bed Mobility Overal bed mobility: Needs Assistance Bed Mobility: Supine to Sit     Supine to sit: HOB elevated, Used rails, Mod assist     General bed mobility comments: increased time and cues    Transfers Overall transfer level: Needs assistance Equipment used: Rolling walker (2 wheels) Transfers: Sit to/from Stand Sit to Stand: Min assist           General transfer comment: increased time, cues and pull to stand from elevated EOB to RW, pt required min/mod A for sit to stand from standard commode with use of L grab bar    Ambulation/Gait Ambulation/Gait assistance: Min assist Gait Distance (Feet): 15 Feet Assistive device: Rolling walker (2 wheels) Gait Pattern/deviations: Decreased step length - right, Decreased step length - left, Trunk flexed, Shuffle Gait velocity: decreased     General Gait Details: min cues for posture, proper distance from RW  and safety with min A for RW navigation in personal room   Stairs             Wheelchair Mobility     Tilt Bed    Modified Rankin (Stroke Patients Only)       Balance Overall balance assessment: History of Falls, Needs assistance Sitting-balance support: Feet supported Sitting balance-Leahy Scale: Fair     Standing balance support: Bilateral upper extremity supported, During functional  activity, Reliant on assistive device for balance Standing balance-Leahy Scale: Poor                              Cognition Arousal: Alert Behavior During Therapy: Flat affect Overall Cognitive Status: Impaired/Different from baseline Area of Impairment: Memory, Safety/judgement, Awareness                     Memory: Decreased short-term memory   Safety/Judgement: Decreased awareness of safety              Exercises      General Comments        Pertinent Vitals/Pain Pain Assessment Pain Assessment: No/denies pain Breathing: normal Negative Vocalization: none Facial Expression: smiling or inexpressive Body Language: relaxed Consolability: no need to console PAINAD Score: 0    Home Living Family/patient expects to be discharged to:: Private residence Living Arrangements: Alone Available Help at Discharge: Family;Available 24 hours/day (daughters take turns staying with pt x 2 months) Type of Home: House Home Access: Stairs to enter Entrance Stairs-Rails: Right Entrance Stairs-Number of Steps: 4   Home Layout: One level Home Equipment: Cane - single point;Educational psychologist (4 wheels) Additional Comments: daughter assisting with PLOF    Prior Function            PT Goals (current goals can now be found in the care plan section) Acute Rehab PT Goals Patient Stated Goal: to be able to get well PT Goal Formulation: With patient Time For Goal Achievement: 02/03/23 Potential to Achieve Goals: Good Progress towards PT goals: Progressing toward goals    Frequency    Min 1X/week      PT Plan      Co-evaluation              AM-PAC PT "6 Clicks" Mobility   Outcome Measure  Help needed turning from your back to your side while in a flat bed without using bedrails?: A Little Help needed moving from lying on your back to sitting on the side of a flat bed without using bedrails?: A Lot Help needed moving to and from a bed to a  chair (including a wheelchair)?: A Little Help needed standing up from a chair using your arms (e.g., wheelchair or bedside chair)?: A Little Help needed to walk in hospital room?: A Little Help needed climbing 3-5 steps with a railing? : Total 6 Click Score: 15    End of Session Equipment Utilized During Treatment: Gait belt;Oxygen Activity Tolerance: Patient limited by fatigue Patient left: in chair;with call bell/phone within reach Nurse Communication: Mobility status PT Visit Diagnosis: Unsteadiness on feet (R26.81);Repeated falls (R29.6);Difficulty in walking, not elsewhere classified (R26.2)     Time: 1137-1200 PT Time Calculation (min) (ACUTE ONLY): 23 min  Charges:    $Gait Training: 8-22 mins $Therapeutic Activity: 8-22 mins PT General Charges $$ ACUTE PT VISIT: 1 Visit  [place], PT Acute Rehab    Jacqualyn Posey 01/22/2023, 3:20 PM

## 2023-01-22 NOTE — Plan of Care (Signed)
  Problem: Activity: Goal: Risk for activity intolerance will decrease Outcome: Progressing   Problem: Nutrition: Goal: Adequate nutrition will be maintained Outcome: Progressing   Problem: Elimination: Goal: Will not experience complications related to bowel motility Outcome: Progressing   Problem: Pain Management: Goal: General experience of comfort will improve Outcome: Progressing   Problem: Safety: Goal: Ability to remain free from injury will improve Outcome: Progressing

## 2023-01-22 NOTE — Progress Notes (Signed)
Progress Note    [name]   WUJ:[id number]  DOB: 11-08-1945  DOA: 01/18/2023     4 PCP: [name], MD  Initial CC: Shortness of breath, lower extremity edema  Hospital Course: [name] is a 77 yo male with PMH Stage IV RCC s/p right nephrectomy (mets to liver and omentum 2019). He is followed closely by oncology and has been on multiple treatment regimens since diagnosis. He presented to the hospital with shortness of breath, hypoxia, and worsening lower extremity edema. He was started on diuresis and admitted for volume overload.  Interval History:  Patient continuing to pull off nasal cannula and having oxygen desaturations.  He was moved closer to the nursing station overnight.  Assessment and Plan: * Pleural effusion Bilateral pleural effusion-small Presents with shortness of breath as well as volume overload. Has acutely worsening lower extremity edema. Dopplers are negative for DVT as well as CT PE protocol negative for PE. No evidence of pneumonia. Recent echocardiogram in November 2024 shows EF of 65%.  Diastolic dysfunction.  No significant valvular abnormality. - continue lasix and monitor response  Hypokalemia - Severely depleted on admission - Has responded well to repletion - Continue trending  Normocytic anemia - Hgb 6.8 g/dL on 95/6.  No overt signs of bleeding.  Suspect etiology in setting of underlying chemo use with bone marrow suppression -1 unit PRBC ordered 12/5 - Hgb improved this am, 8.6 g/dL  Acute respiratory failure with hypoxia (HCC) - Suspected due to third spacing and underlying pleural effusions - Continue diuresis - Will try to wean from oxygen prior to discharge but have informed daughter and patient he may need home O2  Euthyroid sick syndrome - TSH 10.148 - Free T4 essentially normal, even mildly elevated, 1.16 - home dose has fluctuated between 150 to 175 recently but recent fill was for 175; since FT4 is ULN, need to  reduce back to 150 mcg and repeat studies outpatient once he is back to normal baseline - can recheck TSH/T4 outpatient in approx 6 weeks  Back pain - Fall approximately 4 days prior to admission with complaints of low back pain - Lumbar x-ray obtained 01/18/2023 showing no acute injuries - Continue observation for now and if worsening pain, can consider further imaging  Paroxysmal atrial fibrillation (HCC) - Eliquis has been discontinued in setting of recurrent falls - Continue Cardizem and metoprolol  Secondary adrenal insufficiency (HCC) - continue home regimen - stress dosing if/when indicated  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 Patient on belzutifan as outpatient   Old records reviewed in assessment of this patient  Antimicrobials:   DVT prophylaxis:  SCDs Start: 01/18/23 1759   Code Status:   Code Status: Full Code  Mobility Assessment (Last 72 Hours)     Mobility Assessment     Row Name 01/22/23 1500 01/22/23 0739 01/21/23 2000 01/21/23 0902 01/20/23 1913   Does patient have an order for bedrest or is patient medically unstable -- Yes- Bedfast (Level 1) - Complete Yes- Bedfast (Level 1) - Complete Yes- Bedfast (Level 1) - Complete Yes- Bedfast (Level 1) - Complete   What is the highest level of mobility based on the progressive mobility assessment? Level 4 (Walks with assist in room) - Balance while marching in place and cannot step forward and back - Complete Level 3 (Stands with assist) - Balance while standing  and cannot march in place Level 4 (Walks with assist in room) - Balance while  marching in place and cannot step forward and back - Complete Level 4 (Walks with assist in room) - Balance while marching in place and cannot step forward and back - Complete Level 4 (Walks with assist in room) - Balance while marching in place and cannot step forward and back - Complete   Is the above level different from baseline mobility prior to  current illness? -- Yes - Recommend PT order -- -- --    Row Name 01/20/23 1100 01/20/23 0830 01/19/23 2018       Does patient have an order for bedrest or is patient medically unstable -- Yes- Bedfast (Level 1) - Complete Yes- Bedfast (Level 1) - Complete     What is the highest level of mobility based on the progressive mobility assessment? Level 4 (Walks with assist in room) - Balance while marching in place and cannot step forward and back - Complete Level 4 (Walks with assist in room) - Balance while marching in place and cannot step forward and back - Complete --              Barriers to discharge: none Disposition Plan: Home Status is: Inpatient  Objective: Blood pressure 127/61, pulse 76, temperature (!) 97.5 F (36.4 C), temperature source Oral, resp. rate 20, height 5\' 8"  (1.727 m), weight 73.3 kg, SpO2 100%.  Examination:  Physical Exam Constitutional:      General: He is not in acute distress.    Comments: Fatigued appearing  HENT:     Head: Normocephalic and atraumatic.     Mouth/Throat:     Mouth: Mucous membranes are moist.  Eyes:     Extraocular Movements: Extraocular movements intact.  Cardiovascular:     Rate and Rhythm: Normal rate and regular rhythm.  Pulmonary:     Effort: Pulmonary effort is normal. No respiratory distress.     Breath sounds: Normal breath sounds. No wheezing.  Abdominal:     General: Bowel sounds are normal. There is no distension.     Palpations: Abdomen is soft.     Tenderness: There is no abdominal tenderness.  Musculoskeletal:        General: Swelling present. Normal range of motion.     Cervical back: Normal range of motion and neck supple.  Skin:    General: Skin is warm and dry.  Neurological:     General: No focal deficit present.     Mental Status: He is alert.  Psychiatric:        Mood and Affect: Mood normal.        Behavior: Behavior normal.      Consultants:    Procedures:    Data Reviewed: Results for  orders placed or performed during the hospital encounter of 01/18/23 (from the past 24 hour(s))  Basic metabolic panel     Status: Abnormal   Collection Time: 01/22/23  6:07 AM  Result Value Ref Range   Sodium 140 135 - 145 mmol/L   Potassium 3.7 3.5 - 5.1 mmol/L   Chloride 98 98 - 111 mmol/L   CO2 33 (H) 22 - 32 mmol/L   Glucose, Bld 139 (H) 70 - 99 mg/dL   BUN 11 8 - 23 mg/dL   Creatinine, Ser 4.09 0.61 - 1.24 mg/dL   Calcium 8.1 (L) 8.9 - 10.3 mg/dL   GFR, Estimated >81 >19 mL/min   Anion gap 9 5 - 15  CBC with Differential/Platelet     Status: Abnormal   Collection Time:  01/22/23  6:07 AM  Result Value Ref Range   WBC 18.5 (H) 4.0 - 10.5 K/uL   RBC 3.41 (L) 4.22 - 5.81 MIL/uL   Hemoglobin 8.6 (L) 13.0 - 17.0 g/dL   HCT 40.9 (L) 81.1 - 91.4 %   MCV 92.1 80.0 - 100.0 fL   MCH 25.2 (L) 26.0 - 34.0 pg   MCHC 27.4 (L) 30.0 - 36.0 g/dL   RDW 78.2 (H) 95.6 - 21.3 %   Platelets 387 150 - 400 K/uL   nRBC 0.0 0.0 - 0.2 %   Neutrophils Relative % 79 %   Neutro Abs 14.7 (H) 1.7 - 7.7 K/uL   Lymphocytes Relative 12 %   Lymphs Abs 2.2 0.7 - 4.0 K/uL   Monocytes Relative 7 %   Monocytes Absolute 1.3 (H) 0.1 - 1.0 K/uL   Eosinophils Relative 1 %   Eosinophils Absolute 0.1 0.0 - 0.5 K/uL   Basophils Relative 0 %   Basophils Absolute 0.0 0.0 - 0.1 K/uL   Immature Granulocytes 1 %   Abs Immature Granulocytes 0.16 (H) 0.00 - 0.07 K/uL  Magnesium     Status: None   Collection Time: 01/22/23  6:07 AM  Result Value Ref Range   Magnesium 2.1 1.7 - 2.4 mg/dL    I have reviewed pertinent nursing notes, vitals, labs, and images as necessary. I have ordered labwork to follow up on as indicated.  I have reviewed the last notes from staff over past 24 hours. I have discussed patient's care plan and test results with nursing staff, CM/SW, and other staff as appropriate.  Time spent: Greater than 50% of the 55 minute visit was spent in counseling/coordination of care for the patient as laid out  in the A&P.   LOS: 4 days   Lewie Chamber, MD [facility] 01/22/2023, 4:10 PM

## 2023-01-23 DIAGNOSIS — J90 Pleural effusion, not elsewhere classified: Secondary | ICD-10-CM | POA: Diagnosis not present

## 2023-01-23 DIAGNOSIS — E0781 Sick-euthyroid syndrome: Secondary | ICD-10-CM | POA: Diagnosis not present

## 2023-01-23 DIAGNOSIS — E876 Hypokalemia: Secondary | ICD-10-CM | POA: Diagnosis not present

## 2023-01-23 DIAGNOSIS — J9601 Acute respiratory failure with hypoxia: Secondary | ICD-10-CM | POA: Diagnosis not present

## 2023-01-23 LAB — MAGNESIUM: Magnesium: 2 mg/dL (ref 1.7–2.4)

## 2023-01-23 LAB — BASIC METABOLIC PANEL
Anion gap: 12 (ref 5–15)
BUN: 14 mg/dL (ref 8–23)
CO2: 34 mmol/L — ABNORMAL HIGH (ref 22–32)
Calcium: 7.6 mg/dL — ABNORMAL LOW (ref 8.9–10.3)
Chloride: 91 mmol/L — ABNORMAL LOW (ref 98–111)
Creatinine, Ser: 0.92 mg/dL (ref 0.61–1.24)
GFR, Estimated: >60 mL/min (ref >60)
Glucose, Bld: 151 mg/dL — ABNORMAL HIGH (ref 70–99)
Potassium: 2.8 mmol/L — ABNORMAL LOW (ref 3.5–5.1)
Sodium: 137 mmol/L (ref 135–145)

## 2023-01-23 LAB — CULTURE, BLOOD (ROUTINE X 2)
Culture: NO GROWTH
Culture: NO GROWTH
Special Requests: ADEQUATE
Special Requests: ADEQUATE

## 2023-01-23 LAB — CBC WITH DIFFERENTIAL/PLATELET
Abs Immature Granulocytes: 0.09 10*3/uL — ABNORMAL HIGH (ref 0.00–0.07)
Basophils Absolute: 0 10*3/uL (ref 0.0–0.1)
Basophils Relative: 0 %
Eosinophils Absolute: 0.1 10*3/uL (ref 0.0–0.5)
Eosinophils Relative: 0 %
HCT: 28.7 % — ABNORMAL LOW (ref 39.0–52.0)
Hemoglobin: 7.9 g/dL — ABNORMAL LOW (ref 13.0–17.0)
Immature Granulocytes: 1 %
Lymphocytes Relative: 9 %
Lymphs Abs: 1.3 10*3/uL (ref 0.7–4.0)
MCH: 25.3 pg — ABNORMAL LOW (ref 26.0–34.0)
MCHC: 27.5 g/dL — ABNORMAL LOW (ref 30.0–36.0)
MCV: 92 fL (ref 80.0–100.0)
Monocytes Absolute: 1.2 10*3/uL — ABNORMAL HIGH (ref 0.1–1.0)
Monocytes Relative: 8 %
Neutro Abs: 11.5 10*3/uL — ABNORMAL HIGH (ref 1.7–7.7)
Neutrophils Relative %: 82 %
Platelets: 269 10*3/uL (ref 150–400)
RBC: 3.12 MIL/uL — ABNORMAL LOW (ref 4.22–5.81)
RDW: 18.5 % — ABNORMAL HIGH (ref 11.5–15.5)
WBC: 14.2 10*3/uL — ABNORMAL HIGH (ref 4.0–10.5)
nRBC: 0 % (ref 0.0–0.2)

## 2023-01-23 MED ORDER — POTASSIUM CHLORIDE CRYS ER 20 MEQ PO TBCR
40.0000 meq | EXTENDED_RELEASE_TABLET | ORAL | Status: AC
  Administered 2023-01-23 (×2): 40 meq via ORAL
  Filled 2023-01-23 (×2): qty 2

## 2023-01-23 MED ORDER — LEVOTHYROXINE SODIUM 150 MCG PO TABS: 150.0000 ug | ORAL_TABLET | Freq: Every day | ORAL | 3 refills | Status: DC

## 2023-01-23 MED ORDER — METOPROLOL SUCCINATE ER 50 MG PO TB24: 50.0000 mg | ORAL_TABLET | Freq: Every day | ORAL | 3 refills | Status: DC

## 2023-01-23 NOTE — Plan of Care (Signed)
  Problem: Nutrition: Goal: Adequate nutrition will be maintained Outcome: Progressing   Problem: Elimination: Goal: Will not experience complications related to bowel motility Outcome: Progressing   Problem: Safety: Goal: Ability to remain free from injury will improve Outcome: Progressing   Problem: Skin Integrity: Goal: Risk for impaired skin integrity will decrease Outcome: Progressing   

## 2023-01-23 NOTE — Discharge Summary (Addendum)
Physician Discharge Summary   Tavarous Maese YQI:[id number] DOB: 1945-11-22 DOA: 01/18/2023  PCP: [name], MD  Admit date: 01/18/2023 Discharge date: 01/23/2023   Admitted From: Home Disposition:  Home Discharging physician: Lewie Chamber, MD Barriers to discharge: none  Recommendations at discharge: If further functional decline, may be time to engage palliative care and have some GOC discussions. Overall he appears to be slowly declining Repeat thyroid studies in 4 to 6 weeks  Home Health: PT, OT, RN  Discharge Condition: stable CODE STATUS: Full Diet recommendation:  Diet Orders (From admission, onward)     Start     Ordered   01/23/23 0000  Diet - low sodium heart healthy        01/23/23 1017   01/19/23 1014  Diet 2 gram sodium Room service appropriate? Yes; Fluid consistency: Thin  Diet effective now       Question Answer Comment  Room service appropriate? Yes   Fluid consistency: Thin      01/19/23 1013            Hospital Course: Mr. 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 He is followed closely by oncology and has been on multiple treatment regimens since diagnosis. He presented to the hospital with shortness of breath, hypoxia, and worsening lower extremity edema. He was started on diuresis and admitted for volume overload.  Assessment and Plan: * Pleural effusion Bilateral pleural effusion-small Presents with shortness of breath as well as volume overload. Has acutely worsening lower extremity edema. Dopplers are negative for DVT as well as CT PE protocol negative for PE. No evidence of pneumonia. Recent echocardiogram in November 2024 shows EF of 65%.  Diastolic dysfunction.  No significant valvular abnormality. -Diuresed fairly well on Lasix - Continued on oral Lasix at discharge  Hypokalemia - Repleted  Normocytic anemia - Hgb 6.8 g/dL on 38/7.  No overt signs of bleeding.   Suspect etiology in setting of underlying chemo use with bone marrow suppression -1 unit PRBC ordered 12/5  Acute respiratory failure with hypoxia (HCC) - Suspected due to third spacing and underlying pleural effusions - Continue diuresis - Home oxygen arranged at discharge  Euthyroid sick syndrome - TSH 10.148 - Free T4 essentially normal, even mildly elevated, 1.16 - home dose has fluctuated between 150 to 175 recently but recent fill was for 175; since FT4 is ULN, need to reduce back to 150 mcg and repeat studies outpatient once he is back to normal baseline - can recheck TSH/T4 outpatient in approx 6 weeks  Back pain - Fall approximately 4 days prior to admission with complaints of low back pain - Lumbar x-ray obtained 01/18/2023 showing no acute injuries - Continue observation for now and if worsening pain, can consider further imaging  Paroxysmal atrial fibrillation (HCC) - Eliquis has been discontinued in setting of recurrent falls - Continue Cardizem and metoprolol  Secondary adrenal insufficiency (HCC) - continue home regimen  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   Hypokalemia 12/09/2022    Priority: 2.   Normocytic anemia 01/21/2023    Priority: 3.   Acute respiratory failure with hypoxia (HCC) 01/20/2023    Priority: 3.   Euthyroid sick syndrome 01/18/2023  Priority: 3.   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     Discharge Instructions     Diet - low sodium heart healthy   Complete  by: As directed    Increase activity slowly   Complete by: As directed       Allergies as of 01/23/2023       Reactions   Diphenhydramine Other (See Comments)   Severely restless legs        Medication List     STOP taking these medications    posaconazole 100 MG Tbec delayed-release tablet Commonly known as: NOXAFIL       TAKE these medications    acetaminophen 500 MG tablet Commonly known as: TYLENOL Take 1,000 mg by mouth as needed for mild pain (pain score 1-3) or moderate pain (pain score 4-6).   Advil 200 MG Caps Generic drug: Ibuprofen Take 400 mg by mouth every 6 (six) hours as needed (for mild pain).   Centrum Silver 50+Men Tabs Take 1 tablet by mouth at bedtime.   diazepam 2 MG tablet Commonly known as: VALIUM TAKE 1-2 TABLETS BY MOUTH EVERY 8 HOURS AS NEEDED FOR ANXIETY What changed:  how much to take how to take this when to take this reasons to take this additional instructions   diltiazem 240 MG 24 hr capsule Commonly known as: CARDIZEM CD Take 1 capsule (240 mg total) by mouth in the morning.   famotidine 20 MG tablet Commonly known as: PEPCID Take 20 mg by mouth daily in the afternoon.   furosemide 20 MG tablet Commonly known as: LASIX Take 20 mg by mouth in the morning.   hydrocortisone 5 MG tablet Commonly known as: CORTEF Take 1 tablet (5 mg total) by mouth See admin instructions. Daily at 3pm. What changed:  how much to take additional instructions   levothyroxine 150 MCG tablet Commonly known as: SYNTHROID Take 1 tablet (150 mcg total) by mouth daily before breakfast. What changed: Another medication with the same name was removed. Continue taking this medication, and follow the directions you see here.   loratadine 10 MG tablet Commonly known as: CLARITIN Take 10 mg by mouth daily.   megestrol 20 MG tablet Commonly known as: MEGACE TAKE 1 TABLET BY MOUTH DAILY   metoprolol succinate 50 MG 24 hr tablet Commonly  known as: TOPROL-XL Take 1 tablet (50 mg total) by mouth daily. Take with or immediately following a meal. Start taking on: January 24, 2023 What changed:  medication strength how much to take   mupirocin ointment 2 % Commonly known as: BACTROBAN Place 1 Application into the nose daily as needed (sores).   potassium chloride 20 MEQ/15ML (10%) Soln Take 15 mLs (20 mEq total) by mouth 2 (two) times daily.   VITAMIN D3 PO Take 1 capsule by mouth at bedtime.   Welireg 40 MG tablet Generic drug: belzutifan Take 120 mg by mouth daily.               Durable Medical Equipment  (From admission, onward)           Start     Ordered   01/23/23 1004  For home use only DME oxygen  Once       Question Answer  Comment  Length of Need Lifetime   Mode or (Route) Nasal cannula   Liters per Minute 3   Frequency Continuous (stationary and portable oxygen unit needed)   Oxygen conserving device Yes   Oxygen delivery system Gas      01/23/23 1003            Follow-up Information     [facility] Follow up.   Why: [facility] is providing oxygen, their contact number is [phone] or  [phone]        [facility], [facility] Follow up.   Specialty: Home Health Services Why: [facility] will be providing HH PT, OT, and RN. Contact information: [address] [place] [address] [phone]                Allergies  Allergen Reactions   Diphenhydramine Other (See Comments)    Severely restless legs    Consultations:   Procedures:   Discharge Exam: BP (!) 115/59 (BP Location: Right Arm)   Pulse 81   Temp 98.1 F (36.7 C) (Oral)   Resp 18   Ht 5\' 8"  (1.727 m)   Wt 71.8 kg   SpO2 97%   BMI 24.07 kg/m  Physical Exam Constitutional:      General: He is not in acute distress.    Comments: Fatigued appearing  HENT:     Head: Normocephalic and atraumatic.     Mouth/Throat:     Mouth: Mucous membranes are moist.  Eyes:      Extraocular Movements: Extraocular movements intact.  Cardiovascular:     Rate and Rhythm: Normal rate and regular rhythm.  Pulmonary:     Effort: Pulmonary effort is normal. No respiratory distress.     Breath sounds: Normal breath sounds. No wheezing.  Abdominal:     General: Bowel sounds are normal. There is no distension.     Palpations: Abdomen is soft.     Tenderness: There is no abdominal tenderness.  Musculoskeletal:        General: Swelling present. Normal range of motion.     Cervical back: Normal range of motion and neck supple.  Skin:    General: Skin is warm and dry.  Neurological:     General: No focal deficit present.     Mental Status: He is alert.  Psychiatric:        Mood and Affect: Mood normal.        Behavior: Behavior normal.      The results of significant diagnostics from this hospitalization (including imaging, microbiology, ancillary and laboratory) are listed below for reference.   Microbiology: Recent Results (from the past 240 hour(s))  Resp panel by RT-PCR (RSV, Flu A&B, Covid) Anterior Nasal Swab     Status: None   Collection Time: 01/18/23 11:36 AM   Specimen: Anterior Nasal Swab  Result Value Ref Range Status   SARS Coronavirus 2 by RT PCR NEGATIVE NEGATIVE Final    Comment: (NOTE) SARS-CoV-2 target nucleic acids are NOT DETECTED.  The SARS-CoV-2 RNA is generally detectable in upper respiratory specimens during the acute phase of infection. The lowest concentration of SARS-CoV-2 viral copies this assay can detect is 138 copies/mL. A negative result does not preclude SARS-Cov-2 infection and should not be used as the sole basis for treatment or other patient management decisions. A negative result may occur with  improper specimen collection/handling, submission of specimen other than nasopharyngeal swab, presence of viral mutation(s) within the areas targeted  by this assay, and inadequate number of viral copies(<138 copies/mL). A negative  result must be combined with clinical observations, patient history, and epidemiological information. The expected result is Negative.  Fact Sheet for Patients:  BloggerCourse.com  Fact Sheet for Healthcare Providers:  SeriousBroker.it  This test is no t yet approved or cleared by the [place] FDA and  has been authorized for detection and/or diagnosis of SARS-CoV-2 by FDA under an Emergency Use Authorization (EUA). This EUA will remain  in effect (meaning this test can be used) for the duration of the COVID-19 declaration under Section 564(b)(1) of the Act, 21 U.S.C.section 360bbb-3(b)(1), unless the authorization is terminated  or revoked sooner.       Influenza A by PCR NEGATIVE NEGATIVE Final   Influenza B by PCR NEGATIVE NEGATIVE Final    Comment: (NOTE) The Xpert Xpress SARS-CoV-2/FLU/RSV plus assay is intended as an aid in the diagnosis of influenza from Nasopharyngeal swab specimens and should not be used as a sole basis for treatment. Nasal washings and aspirates are unacceptable for Xpert Xpress SARS-CoV-2/FLU/RSV testing.  Fact Sheet for Patients: BloggerCourse.com  Fact Sheet for Healthcare Providers: SeriousBroker.it  This test is not yet approved or cleared by the [place] FDA and has been authorized for detection and/or diagnosis of SARS-CoV-2 by FDA under an Emergency Use Authorization (EUA). This EUA will remain in effect (meaning this test can be used) for the duration of the COVID-19 declaration under Section 564(b)(1) of the Act, 21 U.S.C. section 360bbb-3(b)(1), unless the authorization is terminated or revoked.     Resp Syncytial Virus by PCR NEGATIVE NEGATIVE Final    Comment: (NOTE) Fact Sheet for Patients: BloggerCourse.com  Fact Sheet for Healthcare Providers: SeriousBroker.it  This  test is not yet approved or cleared by the [place] FDA and has been authorized for detection and/or diagnosis of SARS-CoV-2 by FDA under an Emergency Use Authorization (EUA). This EUA will remain in effect (meaning this test can be used) for the duration of the COVID-19 declaration under Section 564(b)(1) of the Act, 21 U.S.C. section 360bbb-3(b)(1), unless the authorization is terminated or revoked.  Performed at [facility], [address] [place], [place] [address]   Culture, blood (Routine X 2) w Reflex to ID Panel     Status: None   Collection Time: 01/18/23  8:20 PM   Specimen: BLOOD LEFT ARM  Result Value Ref Range Status   Specimen Description   Final    BLOOD LEFT ARM Performed at [facility], [address], [place], [place] [address]    Special Requests   Final    BOTTLES DRAWN AEROBIC AND ANAEROBIC Blood Culture adequate volume Performed at [facility], [address] [place], [place] [address]    Culture   Final    NO GROWTH 5 DAYS Performed at [facility], [address] [place], [place] [address]    Report Status 01/23/2023 FINAL  Final  Culture, blood (Routine X 2) w Reflex to ID Panel     Status: None   Collection Time: 01/18/23  8:40 PM   Specimen: BLOOD RIGHT ARM  Result Value Ref Range Status   Specimen Description   Final    BLOOD RIGHT ARM Performed at [facility], [address], [place], [place] [address]    Special Requests   Final    BOTTLES DRAWN AEROBIC AND ANAEROBIC Blood Culture adequate volume Performed at [facility], [address], [place], [place] [address]    Culture   Final    NO GROWTH 5 DAYS Performed at [facility], [address], [place], [place] [address]    Report Status 01/23/2023 FINAL  Final     Labs: BNP (last 3 results) Recent Labs    12/18/22 0901 12/20/22 1020 01/18/23 1133  BNP 841.8* 569.6* 440.3*   Basic  Metabolic Panel: Recent Labs  Lab 01/18/23 1133 01/18/23 2020 01/19/23 1342 01/20/23 0606 01/21/23 0656 01/22/23 0607 01/23/23 0534  NA 139   < > 136 140 142 140 137  K <2.0*   < > 2.8* 3.2* 3.4* 3.7 2.8*  CL 96*   < > 95* 97* 99 98 91*  CO2 34*   < > 34* 33* 33* 33* 34*  GLUCOSE 97   < > 126* 134* 134* 139* 151*  BUN 12   < > 9 11 11 11 14   CREATININE 1.01   < > 0.87 1.00 0.90 0.97 0.92  CALCIUM 7.6*   < > 7.4* 7.8* 8.1* 8.1* 7.6*  MG 1.8  --   --  2.0 2.1 2.1 2.0   < > = values in this interval not displayed.   Liver Function Tests: Recent Labs  Lab 01/18/23 1133 01/20/23 0606  AST 14* 15  ALT 12 12  ALKPHOS 164* 159*  BILITOT 0.4 0.5  PROT 5.8* 5.6*  ALBUMIN 2.2* 2.1*   No results for input(s): "LIPASE", "AMYLASE" in the last 168 hours. No results for input(s): "AMMONIA" in the last 168 hours. CBC: Recent Labs  Lab 01/18/23 1133 01/18/23 2212 01/19/23 0544 01/20/23 0606 01/21/23 0656 01/22/23 0607 01/23/23 0534  WBC 8.8   < > 10.1 18.2* 14.1* 18.5* 14.2*  NEUTROABS 6.9  --   --  15.7* 11.3* 14.7* 11.5*  HGB 13.1   < > 7.4* 7.5* 6.8* 8.6* 7.9*  HCT 47.9   < > 26.5* 27.5* 24.8* 31.4* 28.7*  MCV 89.2   < > 88.0 90.5 89.5 92.1 92.0  PLT 242   < > 301 331 285 387 269   < > = values in this interval not displayed.   Cardiac Enzymes: No results for input(s): "CKTOTAL", "CKMB", "CKMBINDEX", "TROPONINI" in the last 168 hours. BNP: Invalid input(s): "POCBNP" CBG: No results for input(s): "GLUCAP" in the last 168 hours. D-Dimer No results for input(s): "DDIMER" in the last 72 hours. Hgb A1c No results for input(s): "HGBA1C" in the last 72 hours. Lipid Profile No results for input(s): "CHOL", "HDL", "LDLCALC", "TRIG", "CHOLHDL", "LDLDIRECT" in the last 72 hours. Thyroid function studies No results for input(s): "TSH", "T4TOTAL", "T3FREE", "THYROIDAB" in the last 72 hours.  Invalid input(s): "FREET3" Anemia work up No results for input(s): "VITAMINB12",  "FOLATE", "FERRITIN", "TIBC", "IRON", "RETICCTPCT" in the last 72 hours. Urinalysis    Component Value Date/Time   COLORURINE YELLOW 12/20/2022 1145   APPEARANCEUR CLEAR 12/20/2022 1145   LABSPEC 1.017 12/20/2022 1145   PHURINE 6.0 12/20/2022 1145   GLUCOSEU 50 (A) 12/20/2022 1145   HGBUR NEGATIVE 12/20/2022 1145   BILIRUBINUR NEGATIVE 12/20/2022 1145   KETONESUR NEGATIVE 12/20/2022 1145   PROTEINUR 100 (A) 12/20/2022 1145   UROBILINOGEN 0.2 11/12/2009 1000   NITRITE NEGATIVE 12/20/2022 1145   LEUKOCYTESUR NEGATIVE 12/20/2022 1145   Sepsis Labs Recent Labs  Lab 01/20/23 0606 01/21/23 0656 01/22/23 0607 01/23/23 0534  WBC 18.2* 14.1* 18.5* 14.2*   Microbiology Recent Results (from the past 240 hour(s))  Resp panel by RT-PCR (RSV, Flu A&B, Covid) Anterior Nasal Swab     Status: None   Collection Time: 01/18/23 11:36 AM   Specimen: Anterior Nasal Swab  Result Value Ref Range Status   SARS Coronavirus 2 by RT PCR NEGATIVE NEGATIVE Final    Comment: (NOTE) SARS-CoV-2 target nucleic acids are NOT DETECTED.  The SARS-CoV-2 RNA is generally detectable in upper respiratory specimens during the acute phase of infection. The lowest concentration of SARS-CoV-2 viral copies this assay can detect is 138 copies/mL. A negative result does not preclude SARS-Cov-2 infection and should not be used as the sole basis for treatment or other patient management decisions. A negative result may occur with  improper specimen collection/handling, submission of specimen other than nasopharyngeal swab, presence of viral mutation(s) within the areas targeted by this assay, and inadequate number of viral copies(<138 copies/mL). A negative result must be combined with clinical observations, patient history, and epidemiological information. The expected result is Negative.  Fact Sheet for Patients:  BloggerCourse.com  Fact Sheet for Healthcare Providers:   SeriousBroker.it  This test is no t yet approved or cleared by the [place] FDA and  has been authorized for detection and/or diagnosis of SARS-CoV-2 by FDA under an Emergency Use Authorization (EUA). This EUA will remain  in effect (meaning this test can be used) for the duration of the COVID-19 declaration under Section 564(b)(1) of the Act, 21 U.S.C.section 360bbb-3(b)(1), unless the authorization is terminated  or revoked sooner.       Influenza A by PCR NEGATIVE NEGATIVE Final   Influenza B by PCR NEGATIVE NEGATIVE Final    Comment: (NOTE) The Xpert Xpress SARS-CoV-2/FLU/RSV plus assay is intended as an aid in the diagnosis of influenza from Nasopharyngeal swab specimens and should not be used as a sole basis for treatment. Nasal washings and aspirates are unacceptable for Xpert Xpress SARS-CoV-2/FLU/RSV testing.  Fact Sheet for Patients: BloggerCourse.com  Fact Sheet for Healthcare Providers: SeriousBroker.it  This test is not yet approved or cleared by the [place] FDA and has been authorized for detection and/or diagnosis of SARS-CoV-2 by FDA under an Emergency Use Authorization (EUA). This EUA will remain in effect (meaning this test can be used) for the duration of the COVID-19 declaration under Section 564(b)(1) of the Act, 21 U.S.C. section 360bbb-3(b)(1), unless the authorization is terminated or revoked.     Resp Syncytial Virus by PCR NEGATIVE NEGATIVE Final    Comment: (NOTE) Fact Sheet for Patients: BloggerCourse.com  Fact Sheet for Healthcare Providers: SeriousBroker.it  This test is not yet approved or cleared by the [place] FDA and has been authorized for detection and/or diagnosis of SARS-CoV-2 by FDA under an Emergency Use Authorization (EUA). This EUA will remain in effect (meaning this test can be used) for  the duration of the COVID-19 declaration under Section 564(b)(1) of the Act, 21 U.S.C. section 360bbb-3(b)(1), unless the authorization is terminated or revoked.  Performed at [facility], [address], [place], [place] [address]   Culture, blood (Routine X 2) w Reflex to ID Panel     Status: None   Collection Time: 01/18/23  8:20 PM   Specimen: BLOOD LEFT ARM  Result Value Ref Range Status   Specimen Description   Final    BLOOD LEFT ARM Performed at [facility], [address] [place], [place] [address]    Special Requests   Final    BOTTLES DRAWN AEROBIC AND ANAEROBIC Blood Culture  adequate volume Performed at [facility], [address] [place], [place] [address]    Culture   Final    NO GROWTH 5 DAYS Performed at [facility], [address] [place], [place] [address]    Report Status 01/23/2023 FINAL  Final  Culture, blood (Routine X 2) w Reflex to ID Panel     Status: None   Collection Time: 01/18/23  8:40 PM   Specimen: BLOOD RIGHT ARM  Result Value Ref Range Status   Specimen Description   Final    BLOOD RIGHT ARM Performed at [facility], [address], [place], [place] [address]    Special Requests   Final    BOTTLES DRAWN AEROBIC AND ANAEROBIC Blood Culture adequate volume Performed at [facility], [address] [place], [place] [address]    Culture   Final    NO GROWTH 5 DAYS Performed at [facility], [address], [place], [place] [address]    Report Status 01/23/2023 FINAL  Final    Procedures/Studies: VAS [place] LOWER EXTREMITY VENOUS (DVT) (7a-7p)  Result Date: 01/19/2023  Lower Venous DVT Study Patient Name:  DESTINY SCHMUCK  Date of Exam:   01/18/2023 Medical Rec #: [id number]   Accession #:    [id number] Date of Birth: March 23, 1945   Patient Gender: M Patient Age:   1 years Exam Location:  [facility] Procedure:      VAS [place] LOWER EXTREMITY VENOUS (DVT)  Referring Phys: JULIE HAVILAND --------------------------------------------------------------------------------  Indications: SOB, Pitting edema., Swelling, and Edema.  Risk Factors: Cancer H/O lung cancer. Comparison Study: No prior exam. Performing Technologist: Fernande Bras  Examination Guidelines: A complete evaluation includes B-mode imaging, spectral Doppler, color Doppler, and power Doppler as needed of all accessible portions of each vessel. Bilateral testing is considered an integral part of a complete examination. Limited examinations for reoccurring indications may be performed as noted. The reflux portion of the exam is performed with the patient in reverse Trendelenburg.  +---------+---------------+---------+-----------+----------+--------------+ RIGHT    CompressibilityPhasicitySpontaneityPropertiesThrombus Aging +---------+---------------+---------+-----------+----------+--------------+ CFV      Full           Yes      Yes                                 +---------+---------------+---------+-----------+----------+--------------+ SFJ      Full                                                        +---------+---------------+---------+-----------+----------+--------------+ FV Prox  Full                                                        +---------+---------------+---------+-----------+----------+--------------+ FV Mid   Full                                                        +---------+---------------+---------+-----------+----------+--------------+  FV DistalFull                                                        +---------+---------------+---------+-----------+----------+--------------+ PFV      Full                                                        +---------+---------------+---------+-----------+----------+--------------+ POP      Full           Yes      Yes                                  +---------+---------------+---------+-----------+----------+--------------+ PTV      Full                                                        +---------+---------------+---------+-----------+----------+--------------+ PERO     Full                                                        +---------+---------------+---------+-----------+----------+--------------+   +---------+---------------+---------+-----------+----------+--------------+ LEFT     CompressibilityPhasicitySpontaneityPropertiesThrombus Aging +---------+---------------+---------+-----------+----------+--------------+ CFV      Full           Yes      Yes                                 +---------+---------------+---------+-----------+----------+--------------+ SFJ      Full                                                        +---------+---------------+---------+-----------+----------+--------------+ FV Prox  Full                                                        +---------+---------------+---------+-----------+----------+--------------+ FV Mid   Full                                                        +---------+---------------+---------+-----------+----------+--------------+ FV DistalFull                                                        +---------+---------------+---------+-----------+----------+--------------+  PFV      Full                                                        +---------+---------------+---------+-----------+----------+--------------+ POP      Full           Yes      Yes                                 +---------+---------------+---------+-----------+----------+--------------+ PTV      Full                                                        +---------+---------------+---------+-----------+----------+--------------+ PERO     Full                                                         +---------+---------------+---------+-----------+----------+--------------+     Summary: BILATERAL: - No evidence of deep vein thrombosis seen in the lower extremities, bilaterally. -No evidence of popliteal cyst, bilaterally.   *See table(s) above for measurements and observations. Electronically signed by Lemar Livings MD on 01/19/2023 at 5:16:24 PM.    Final    DG Lumbar Spine 2-3 Views  Result Date: 01/18/2023 CLINICAL DATA:  [id number] Back pain [id number] EXAM: LUMBAR SPINE - 2-3 VIEW COMPARISON:  CT chest 07/13/2022 FINDINGS: Limited evaluation due to overlapping osseous structures and overlying soft tissues. L4-L5 and L5-S1 intervertebral disc space narrowing. Chronic appearing vertebral body height loss at the T9, T10, T11, L4, L5 levels. Multilevel mild degenerative changes spine as well as at least moderate facet arthropathy. There is no evidence of lumbar spine fracture. Straightening of normal lumbar lordosis likely due to positioning and degenerative changes. Otherwise alignment is normal. Severe atherosclerotic plaque. IMPRESSION: 1. Negative for definite acute traumatic injury. Consider CT for further evaluation of the L5 level-limited evaluation due to overlapping osseous structures and overlying soft tissues. 2.  Aortic Atherosclerosis (ICD10-I70.0). Electronically Signed   By: Tish Frederickson M.D.   On: 01/18/2023 23:36   CT Angio Chest Pulmonary Embolism (PE) W or WO Contrast  Result Date: 01/18/2023 CLINICAL DATA:  Shortness of breath history of lung cancer EXAM: CT ANGIOGRAPHY CHEST WITH CONTRAST TECHNIQUE: Multidetector CT imaging of the chest was performed using the standard protocol during bolus administration of intravenous contrast. Multiplanar CT image reconstructions and MIPs were obtained to evaluate the vascular anatomy. RADIATION DOSE REDUCTION: This exam was performed according to the departmental dose-optimization program which includes automated exposure control, adjustment of the mA  and/or kV according to patient size and/or use of iterative reconstruction technique. CONTRAST:  75mL OMNIPAQUE IOHEXOL 350 MG/ML SOLN COMPARISON:  CT 11/04/2022, 07/13/2022 FINDINGS: Cardiovascular: Satisfactory opacification of the pulmonary arteries to the segmental level. No evidence of pulmonary embolism. Mild aortic atherosclerosis. No aneurysm or dissection. Coronary vascular calcification. Normal cardiac size. No sizable pericardial effusion. Mediastinum/Nodes: Patent trachea. No suspicious thyroid mass. No suspicious mediastinal  lymph nodes. Esophagus within normal limits. Lungs/Pleura: Small right greater than left pleural effusions. Interval mild right pleural thickening compared to prior. Status post partial right lung resection. No acute airspace disease. Slight nodularity along the right posterolateral pleural surface, series 4, image 60 Upper Abdomen: Partially visualized fatty density at the right nephrectomy bed. Small volume ascites in the upper abdomen. Known hepatic metastatic disease, difficult to appreciate likely due to phase of contrast. Incompletely visualized large pancreatic mass and peripancreatic masses. Suspicion of increased porta hepatis adenopathy. Pancreatic ductal dilatation. Musculoskeletal: Mild chronic endplate deformities at T8 and T9. Mild superior endplate deformity at T10, possibly acute to subacute. Mild paravertebral edema at this level. Review of the MIP images confirms the above findings. IMPRESSION: 1. Negative for acute pulmonary embolus. 2. New small right greater than left pleural effusions. Interval mild right pleural thickening compared to prior, with slight nodularity along the right posterolateral pleural surface. Findings raise concern for possible pleural metastatic disease. 3. Incompletely visualized large pancreatic mass and peripancreatic masses. Known hepatic metastatic disease. Suspicion of increased porta hepatis adenopathy. Small volume ascites in the  upper abdomen. 4. Mild superior endplate deformity at T10, possibly acute to subacute. Correlate for back pain 5. Aortic atherosclerosis. Aortic Atherosclerosis (ICD10-I70.0). Electronically Signed   By: Jasmine Pang M.D.   On: 01/18/2023 17:21   DG Chest Port 1 View  Result Date: 01/18/2023 CLINICAL DATA:  Shortness of breath. EXAM: PORTABLE CHEST 1 VIEW COMPARISON:  12/20/2022. FINDINGS: Redemonstration of elevated right hemidiaphragm. There is blunting of right lateral costophrenic angle, which may represent small right pleural effusion, grossly similar to the prior study. Bilateral lung fields are otherwise clear. Left lateral costophrenic angle is clear. Stable cardio-mediastinal silhouette. No acute osseous abnormalities. The soft tissues are within normal limits. IMPRESSION: *Redemonstration of blunting of right lateral costophrenic angle, which may represent small right pleural effusion. No significant interval change. *No acute cardiopulmonary abnormality. Electronically Signed   By: Jules Schick M.D.   On: 01/18/2023 14:18     Time coordinating discharge: Over 30 minutes    Lewie Chamber, MD  [facility] 01/23/2023, 4:50 PM

## 2023-01-23 NOTE — Progress Notes (Signed)
AVS reviewed with PT daughter [name]. PT will need oxygen support at home. [facility] notified of needs. PT daughter Charisse March will pick him up later after all concerns addressed

## 2023-01-23 NOTE — TOC CM/SW Note (Signed)
    Durable Medical Equipment  (From admission, onward)           Start     Ordered   01/23/23 1004  For home use only DME oxygen  Once       Question Answer Comment  Length of Need Lifetime   Mode or (Route) Nasal cannula   Liters per Minute 3   Frequency Continuous (stationary and portable oxygen unit needed)   Oxygen conserving device Yes   Oxygen delivery system Gas      01/23/23 1003

## 2023-01-23 NOTE — TOC Transition Note (Signed)
Transition of Care [facility]) - CM/SW Discharge Note   Patient Details  Name: Tyquise Lebaron MRN: [id number] Date of Birth: 1945-06-01  [facility] CM/SW Contact:  Darleene Cleaver, LCSW Phone Number: 01/23/2023, 2:12 PM   Clinical Narrative:     CSW was informed that patient needs oxygen now.  CSW was able to have [employer] provide the oxygen for the patient.  Patient is currently open to [facility] for [facility] services.    Patient will be going home with home health through [facility] for PT, OT, and RN.  TOC signing off please reconsult with any other TOC needs, home health agency has been notified of planned discharge.     Final next level of care: Home w Home Health Services Barriers to Discharge: Barriers Resolved   Patient Goals and CMS Choice CMS Medicare.gov Compare Post Acute Care list provided to:: Patient Represenative (must comment) Choice offered to / list presented to : Adult Children  Discharge Placement  Home with home health.                  Name of family member notified: Patient's daughter aware that he is discharging today. Patient and family notified of of transfer: 01/23/23  Discharge Plan and Services Additional resources added to the After Visit Summary for   In-house Referral: NA Discharge Planning Services: CM Consult Post Acute Care Choice: NA          DME Arranged: Oxygen DME Agency: [employer] Date DME Agency Contacted: 01/23/23 Time DME Agency Contacted: 1215 Representative spoke with at DME Agency: Vaughan Basta HH Arranged: PT, OT, RN HH Agency: [facility] Date [facility] Agency Contacted: 01/23/23 Time HH Agency Contacted: 1411 Representative spoke with at [facility] Agency: Cindie  Social Determinants of Health (SDOH) Interventions SDOH Screenings   Food Insecurity: No Food Insecurity (01/18/2023)  Housing: Low Risk  (01/18/2023)  Transportation Needs: No Transportation Needs (01/18/2023)  Utilities: Not At Risk (01/18/2023)   Depression (PHQ2-9): Low Risk  (12/28/2022)  Social Connections: Unknown (06/30/2021)   Received from [facility]  Tobacco Use: Medium Risk (01/18/2023)     Readmission Risk Interventions    01/21/2023    7:26 PM 12/09/2022   11:03 AM  Readmission Risk Prevention Plan  Transportation Screening Complete Complete  PCP or Specialist Appt within 3-5 Days Complete   HRI or Home Care Consult Complete   Social Work Consult for Recovery Care Planning/Counseling Complete   Palliative Care Screening Not Applicable   Medication Review [employer])  Complete  PCP or Specialist appointment within 3-5 days of discharge  Complete  HRI or Home Care Consult  Complete  SW Recovery Care/Counseling Consult  Complete  Palliative Care Screening  Not Applicable  Skilled Nursing Facility  Not Applicable

## 2023-02-01 ENCOUNTER — Other Ambulatory Visit: Payer: Self-pay | Admitting: Physician Assistant

## 2023-02-01 DIAGNOSIS — D509 Iron deficiency anemia, unspecified: Secondary | ICD-10-CM

## 2023-02-01 DIAGNOSIS — D638 Anemia in other chronic diseases classified elsewhere: Secondary | ICD-10-CM

## 2023-02-01 NOTE — Progress Notes (Unsigned)
[facility] OFFICE PROGRESS NOTE  [name], MD [address] [place] [address]  DIAGNOSIS: Stage IV clear-cell renal cell carcinoma diagnosed initially in 2016 as localized T1 a disease with evidence of metastatic disease to the liver and omentum in 2019.   PRIOR THERAPY: 1) status post radical right renal nephrectomy under the care of Dr. Marlou Porch on February 01, 2015 and the final pathology revealed 3.3 cm clear renal cell carcinoma with Fuhrman grade 3. 2) status post omental biopsy in July 2019 that confirmed the presence of recurrent metastatic renal cell carcinoma. 3) status post treatment with immunotherapy with ipilimumab 1 Mg/KG and nivolumab 3 mg/KG every 3 weeks started 09/21/2017 status post 4 cycles and the patient did not receive any additional immunotherapy secondary to adrenal insufficiency and panhypopituitarism 4) status post radiotherapy to the abdominal lymph node completed November 08, 2019. 5) Cabometyx initially started at 40 mg p.o. daily in March 2023 and reduced to 20 mg p.o. daily in April 2023.  This was discontinued in June 2024 secondary to disease progression. 6) Axitinib 2 mg p.o. twice daily.  First dose started 07/29/2022.  This was discontinued on November 12, 2022 secondary to disease progression. 7) 120 mg p.o. daily of belzutifan, starting 01/11/23  CURRENT THERAPY: 120 mg p.o. daily of belzutifan, starting 01/11/23  INTERVAL HISTORY: [name] 77 y.o. male returns to the clinic today for a follow-up visit accompanied by his daughter.  The patient was last seen in the clinic on 01/11/2023.  Overall, the patient has been having overall poor health with significant fatigue, hypokalemia, decreased oral intake, and generalized weakness.  For his kidney cancer, he was prescribed belzutifan but was not able to start it due to struggling with anemia and needing his Hbg >8 and frequent hospitalizations.   The patient actually has  been on his belzutifan since around 01/11/2023.  He was on it for about 1 week prior to presenting to the hospital.  The patient presented to the hospital on 01/18/2023 for the chief complaint of shortness of breath.  His oxygen saturation was 86% on room air when they arrived.  The patient's daughter reported he is very weak and does not have an appetite.  The patient had a CT of the scan on 01/18/2023 that showed small pleural effusions, slight nodularity along the right posterolateral pleural surface (findings raise concern for possible pleural metastatic disease, incompletely visualized large pancreatic mass and peripancreatic masses, known hepatic metastatic disease, and suspicion of increased porta hepatis adenopathy. Once again, the patient had not been on treatment for several weeks and only started his treatment 1 week before his hospitalization   The patient is on Lasix for diastolic dysfunction and some pleural effusions.  His hemoglobin was 6.8 on 01/21/2023.  He was given 1 unit of blood.  There was no signs of overt bleeding so no evaluation was performed. Of note, IV iron was ordered at his last appointment with Dr. Arbutus Ped on 01/11/23. This felt that his acute respiratory failure and hypoxia was secondary to pleural effusions and third spacing.   Of note, CT showed T10 mild endplate deformity acute or subacute. I did review with the patient today. He had fallen a few days prior to the hospitalization.   The patient is accompanied by his daughter today.  He is on 2 L of supplemental oxygen.  He continues to have baseline fatigue and weakness.  His daughter notes that his appetite is better than it  was previously but is still not great overall.  He is still taking Megace.  He reports his breathing is "okay" unless he exerts himself.  He overall feels like his breathing is at his baseline.  Denies any cough, hemoptysis, or chest pain.  He denies any visible bleeding or bruising.  Denies any  nausea, vomiting, diarrhea, or constipation.  He was previously taking an iron supplement but is no longer taking this.  Denies any rashes or skin changes.  He does continue to have some pitting edema in the bilateral lower extremities.  He has not noticed a significant amount of new side symptoms since starting belzutifan. He was in overall poor health and weak prior to starting this. He is here for evaluation and repeat labs and discuss his current condition.    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  MEDICATIONS:  Current Outpatient Medications  Medication Sig Dispense Refill   acetaminophen (TYLENOL) 500 MG tablet Take 1,000 mg by mouth as needed for mild pain (pain score 1-3) or moderate pain (pain score 4-6).     ADVIL 200 MG CAPS Take 400 mg by mouth every 6 (six) hours as needed (for mild pain).     Cholecalciferol (VITAMIN D3 PO) Take 1 capsule by mouth at bedtime.     diazepam (VALIUM) 2 MG tablet TAKE 1-2 TABLETS BY MOUTH EVERY 8 HOURS AS NEEDED FOR ANXIETY (Patient taking differently: Take 2-4 mg by mouth every 8 (eight) hours as needed for anxiety.) 60 tablet 0   diltiazem (CARDIZEM CD) 240 MG 24 hr capsule Take 1 capsule (240 mg total) by mouth in the morning. 90 capsule 3   famotidine (PEPCID) 20 MG tablet Take 20 mg by mouth daily in the afternoon.     furosemide (LASIX) 20 MG tablet  Take 20 mg by mouth in the morning.     hydrocortisone (CORTEF) 5 MG tablet Take 1 tablet (5 mg total) by mouth See admin instructions. Daily at 3pm. (Patient taking differently: Take 5-20 mg by mouth See admin instructions. Take 15 mg by mouth in the morning and 5 mg at 3 PM daily)     levothyroxine (SYNTHROID) 150 MCG tablet Take 1 tablet (150 mcg total) by mouth daily before breakfast. 30 tablet 3   loratadine (CLARITIN) 10 MG tablet Take 10 mg by mouth daily.     megestrol (MEGACE) 20 MG tablet TAKE 1 TABLET BY MOUTH DAILY 30 tablet 1   metoprolol succinate (TOPROL-XL) 50 MG 24 hr tablet Take 1 tablet (50 mg total) by mouth daily. Take with or immediately following a meal. 30 tablet 3   Multiple Vitamins-Minerals (CENTRUM SILVER 50+MEN) TABS Take 1 tablet by mouth at bedtime.     mupirocin ointment (BACTROBAN) 2 % Place  1 Application into the nose daily as needed (sores).     potassium chloride 20 MEQ/15ML (10%) SOLN Take 15 mLs (20 mEq total) by mouth 2 (two) times daily. 300 mL 0   WELIREG 40 MG tablet Take 120 mg by mouth daily.     No current facility-administered medications for this visit.    SURGICAL HISTORY:  Past Surgical History:  Procedure Laterality Date   APPENDECTOMY  age 77   ARTERY BIOPSY Left 04/10/2022   Procedure: LEFT TEMPORAL ARTERY BIOPSY;  Surgeon: Chuck Hint, MD;  Location: [facility] OR;  Service: Vascular;  Laterality: Left;   CHOLECYSTECTOMY  10/18/1978   EYE SURGERY Bilateral 2019   cataract   LAPAROSCOPIC LYSIS OF ADHESIONS N/A 08/01/2015   Procedure: LAPAROSCOPIC LYSIS OF ADHESIONS;  Surgeon: Karie Soda, MD;  Location: [facility] ORS;  Service: General;  Laterality: N/A;   LUNG LOBECTOMY  02/16/1990   lung cancer- patient has staples in lung not to have MRI per patient    MAXILLARY ANTROSTOMY Left 05/22/2022   Procedure: MAXILLARY ANTROSTOMY;  Surgeon: Laren Boom, DO;  Location: [facility] OR;  Service: ENT;  Laterality: Left;   PILONIDAL CYST EXCISION   02/16/2009   Dr Gerrit Friends   ROBOTIC ASSITED PARTIAL NEPHRECTOMY Right 02/01/2015   Procedure: RIGHT ROBOTIC ASSISTED LAPAROSOCOPY NEPHRECTOMY;  Surgeon: Crist Fat, MD;  Location: [facility] ORS;  Service: Urology;  Laterality: Right;   SINUS ENDO WITH FUSION Left 05/22/2022   Procedure: SINUS ENDO WITH FUSION;  Surgeon: Laren Boom, DO;  Location: [facility] OR;  Service: ENT;  Laterality: Left;   VENTRAL HERNIA REPAIR N/A 08/01/2015   Procedure: LAPAROSCOPIC VENTRAL WALL HERNIA WITH MESH;  Surgeon: Karie Soda, MD;  Location: [facility] ORS;  Service: General;  Laterality: N/A;    REVIEW OF SYSTEMS:   Review of Systems  Constitutional: Positive for fatigue, poor appetite, and generalized weakness. Negative for chills or fever.  HENT: Negative for mouth sores, nosebleeds, sore throat and trouble swallowing.   Eyes: Negative for eye problems and icterus.  Respiratory: Positive for shortness of breath (at his baseline per patient report). Negative for cough, hemoptysis, and wheezing.   Cardiovascular: Positive for leg swelling. Negative for chest pain.  Gastrointestinal: Negative for abdominal pain, constipation, diarrhea, nausea and vomiting.  Genitourinary: Negative for bladder incontinence, difficulty urinating, dysuria, frequency and hematuria.   Musculoskeletal: Positive for back pain. Negative for gait problem, neck pain and neck stiffness.  Skin: Negative for itching and rash.  Neurological: Negative for dizziness, extremity weakness, gait problem, headaches, light-headedness and seizures.  Hematological: Negative for adenopathy. Does not bruise/bleed easily.  Psychiatric/Behavioral: Negative for confusion, depression and sleep disturbance. The patient is not nervous/anxious.     PHYSICAL EXAMINATION:  There were no vitals taken for this visit.  ECOG PERFORMANCE STATUS: 3  Physical Exam  Constitutional: Oriented to person, place, and time and chronically ill appearing male, and in no  distress.  HENT:  Head: Normocephalic and atraumatic.  Mouth/Throat: Oropharynx is clear and moist. No oropharyngeal exudate.  Eyes: Conjunctivae are normal. Right eye exhibits no discharge. Left eye exhibits no discharge. No scleral icterus.  Neck: Normal range of motion. Neck supple.  Cardiovascular: Normal rate, regular rhythm, normal heart sounds and intact distal pulses.   Pulmonary/Chest: Effort normal and breath sounds normal. No respiratory distress. No wheezes. No rales. On supplemental oxygen.  Abdominal: Soft. Bowel sounds are normal. Exhibits no distension and no mass. There is no tenderness.  Musculoskeletal: Normal range  of motion. Pitting edema bilaterally.  Lymphadenopathy:    No cervical adenopathy.  Neurological: Alert and oriented to person, place, and time. Exhibits muscle wasting. He was examined in the wheelchair.  Skin: Skin is warm and dry. No rash noted. Not diaphoretic. No erythema. No pallor.  Psychiatric: Mood, memory and judgment normal.  Vitals reviewed.  LABORATORY DATA: Lab Results  Component Value Date   WBC 14.2 (H) 01/23/2023   HGB 7.9 (L) 01/23/2023   HCT 28.7 (L) 01/23/2023   MCV 92.0 01/23/2023   PLT 269 01/23/2023      Chemistry      Component Value Date/Time   NA 137 01/23/2023 0534   NA 141 08/11/2021 1252   K 2.8 (L) 01/23/2023 0534   CL 91 (L) 01/23/2023 0534   CO2 34 (H) 01/23/2023 0534   BUN 14 01/23/2023 0534   BUN 14 08/11/2021 1252   CREATININE 0.92 01/23/2023 0534   CREATININE 0.84 01/11/2023 0842   CREATININE 0.97 10/12/2022 1022      Component Value Date/Time   CALCIUM 7.6 (L) 01/23/2023 0534   ALKPHOS 159 (H) 01/20/2023 0606   AST 15 01/20/2023 0606   AST 11 (L) 01/11/2023 0842   ALT 12 01/20/2023 0606   ALT 7 01/11/2023 0842   BILITOT 0.5 01/20/2023 0606   BILITOT 0.8 01/11/2023 0842       RADIOGRAPHIC STUDIES:  VAS [place] LOWER EXTREMITY VENOUS (DVT) (7a-7p) Result Date: 01/19/2023  Lower Venous DVT Study  Patient Name:  [name]  Date of Exam:   01/18/2023 Medical Rec #: [id number]   Accession #:    [id number] Date of Birth: [birth year]   Patient Gender: M Patient Age:   77 years Exam Location:  [facility] Procedure:      VAS [place] LOWER EXTREMITY VENOUS (DVT) Referring Phys: JULIE HAVILAND --------------------------------------------------------------------------------  Indications: SOB, Pitting edema., Swelling, and Edema.  Risk Factors: Cancer H/O lung cancer. Comparison Study: No prior exam. Performing Technologist: Fernande Bras  Examination Guidelines: A complete evaluation includes B-mode imaging, spectral Doppler, color Doppler, and power Doppler as needed of all accessible portions of each vessel. Bilateral testing is considered an integral part of a complete examination. Limited examinations for reoccurring indications may be performed as noted. The reflux portion of the exam is performed with the patient in reverse Trendelenburg.  +---------+---------------+---------+-----------+----------+--------------+ RIGHT    CompressibilityPhasicitySpontaneityPropertiesThrombus Aging +---------+---------------+---------+-----------+----------+--------------+ CFV      Full           Yes      Yes                                 +---------+---------------+---------+-----------+----------+--------------+ SFJ      Full                                                        +---------+---------------+---------+-----------+----------+--------------+ FV Prox  Full                                                        +---------+---------------+---------+-----------+----------+--------------+ FV Mid   Full                                                        +---------+---------------+---------+-----------+----------+--------------+  FV DistalFull                                                         +---------+---------------+---------+-----------+----------+--------------+ PFV      Full                                                        +---------+---------------+---------+-----------+----------+--------------+ POP      Full           Yes      Yes                                 +---------+---------------+---------+-----------+----------+--------------+ PTV      Full                                                        +---------+---------------+---------+-----------+----------+--------------+ PERO     Full                                                        +---------+---------------+---------+-----------+----------+--------------+   +---------+---------------+---------+-----------+----------+--------------+ LEFT     CompressibilityPhasicitySpontaneityPropertiesThrombus Aging +---------+---------------+---------+-----------+----------+--------------+ CFV      Full           Yes      Yes                                 +---------+---------------+---------+-----------+----------+--------------+ SFJ      Full                                                        +---------+---------------+---------+-----------+----------+--------------+ FV Prox  Full                                                        +---------+---------------+---------+-----------+----------+--------------+ FV Mid   Full                                                        +---------+---------------+---------+-----------+----------+--------------+ FV DistalFull                                                        +---------+---------------+---------+-----------+----------+--------------+  PFV      Full                                                        +---------+---------------+---------+-----------+----------+--------------+ POP      Full           Yes      Yes                                  +---------+---------------+---------+-----------+----------+--------------+ PTV      Full                                                        +---------+---------------+---------+-----------+----------+--------------+ PERO     Full                                                        +---------+---------------+---------+-----------+----------+--------------+     Summary: BILATERAL: - No evidence of deep vein thrombosis seen in the lower extremities, bilaterally. -No evidence of popliteal cyst, bilaterally.   *See table(s) above for measurements and observations. Electronically signed by Lemar Livings MD on 01/19/2023 at 5:16:24 PM.    Final    DG Lumbar Spine 2-3 Views Result Date: 01/18/2023 CLINICAL DATA:  [id number] Back pain [id number] EXAM: LUMBAR SPINE - 2-3 VIEW COMPARISON:  CT chest 07/13/2022 FINDINGS: Limited evaluation due to overlapping osseous structures and overlying soft tissues. L4-L5 and L5-S1 intervertebral disc space narrowing. Chronic appearing vertebral body height loss at the T9, T10, T11, L4, L5 levels. Multilevel mild degenerative changes spine as well as at least moderate facet arthropathy. There is no evidence of lumbar spine fracture. Straightening of normal lumbar lordosis likely due to positioning and degenerative changes. Otherwise alignment is normal. Severe atherosclerotic plaque. IMPRESSION: 1. Negative for definite acute traumatic injury. Consider CT for further evaluation of the L5 level-limited evaluation due to overlapping osseous structures and overlying soft tissues. 2.  Aortic Atherosclerosis (ICD10-I70.0). Electronically Signed   By: Tish Frederickson M.D.   On: 01/18/2023 23:36   CT Angio Chest Pulmonary Embolism (PE) W or WO Contrast Result Date: 01/18/2023 CLINICAL DATA:  Shortness of breath history of lung cancer EXAM: CT ANGIOGRAPHY CHEST WITH CONTRAST TECHNIQUE: Multidetector CT imaging of the chest was performed using the standard protocol during bolus  administration of intravenous contrast. Multiplanar CT image reconstructions and MIPs were obtained to evaluate the vascular anatomy. RADIATION DOSE REDUCTION: This exam was performed according to the departmental dose-optimization program which includes automated exposure control, adjustment of the mA and/or kV according to patient size and/or use of iterative reconstruction technique. CONTRAST:  75mL OMNIPAQUE IOHEXOL 350 MG/ML SOLN COMPARISON:  CT 11/04/2022, 07/13/2022 FINDINGS: Cardiovascular: Satisfactory opacification of the pulmonary arteries to the segmental level. No evidence of pulmonary embolism. Mild aortic atherosclerosis. No aneurysm or dissection. Coronary vascular calcification. Normal cardiac size. No sizable pericardial effusion. Mediastinum/Nodes: Patent trachea. No suspicious thyroid mass. No suspicious mediastinal lymph nodes.  Esophagus within normal limits. Lungs/Pleura: Small right greater than left pleural effusions. Interval mild right pleural thickening compared to prior. Status post partial right lung resection. No acute airspace disease. Slight nodularity along the right posterolateral pleural surface, series 4, image 60 Upper Abdomen: Partially visualized fatty density at the right nephrectomy bed. Small volume ascites in the upper abdomen. Known hepatic metastatic disease, difficult to appreciate likely due to phase of contrast. Incompletely visualized large pancreatic mass and peripancreatic masses. Suspicion of increased porta hepatis adenopathy. Pancreatic ductal dilatation. Musculoskeletal: Mild chronic endplate deformities at T8 and T9. Mild superior endplate deformity at T10, possibly acute to subacute. Mild paravertebral edema at this level. Review of the MIP images confirms the above findings. IMPRESSION: 1. Negative for acute pulmonary embolus. 2. New small right greater than left pleural effusions. Interval mild right pleural thickening compared to prior, with slight  nodularity along the right posterolateral pleural surface. Findings raise concern for possible pleural metastatic disease. 3. Incompletely visualized large pancreatic mass and peripancreatic masses. Known hepatic metastatic disease. Suspicion of increased porta hepatis adenopathy. Small volume ascites in the upper abdomen. 4. Mild superior endplate deformity at T10, possibly acute to subacute. Correlate for back pain 5. Aortic atherosclerosis. Aortic Atherosclerosis (ICD10-I70.0). Electronically Signed   By: Jasmine Pang M.D.   On: 01/18/2023 17:21   DG Chest Port 1 View Result Date: 01/18/2023 CLINICAL DATA:  Shortness of breath. EXAM: PORTABLE CHEST 1 VIEW COMPARISON:  12/20/2022. FINDINGS: Redemonstration of elevated right hemidiaphragm. There is blunting of right lateral costophrenic angle, which may represent small right pleural effusion, grossly similar to the prior study. Bilateral lung fields are otherwise clear. Left lateral costophrenic angle is clear. Stable cardio-mediastinal silhouette. No acute osseous abnormalities. The soft tissues are within normal limits. IMPRESSION: *Redemonstration of blunting of right lateral costophrenic angle, which may represent small right pleural effusion. No significant interval change. *No acute cardiopulmonary abnormality. Electronically Signed   By: Jules Schick M.D.   On: 01/18/2023 14:18     ASSESSMENT/PLAN:  This is a very pleasant 77 year old Caucasian male with Stage IV clear-cell renal cell carcinoma diagnosed initially in 2016 as localized T1 a disease with evidence of metastatic disease to the liver and omentum in 2019. The patient underwent the following treatment:   1) status post radical right renal nephrectomy under the care of Dr. Marlou Porch on February 01, 2015 and the final pathology revealed 3.3 cm clear renal cell carcinoma with Fuhrman grade 3. 2) status post omental biopsy in July 2019 that confirmed the presence of recurrent metastatic  renal cell carcinoma. 3) status post treatment with immunotherapy with ipilimumab 1 Mg/KG and nivolumab 3 mg/KG every 3 weeks started 09/21/2017 status post 4 cycles and the patient did not receive any additional immunotherapy secondary to adrenal insufficiency and panhypopituitarism 4) status post radiotherapy to the abdominal lymph node completed November 08, 2019.   He was on treatment with Cabometyx 20 mg p.o. daily started March 2023. This supposed to be on hold while he had sinus issues and concern for fungal infection. He was on treatment with voriconazole by infectious disease.  He was supposed to be holding his treatment with Cabometyx but his daughter mentioned that the patient had been taking it regularly. This was disontinued in June 2024 due to disease progrsesion.    His CT scan showed abdomen including the pancreatic mass as well as peripancreatic and retroperitoneal lymphadenopathy as well as liver lesions.    Dr. Arbutus Ped recommended treatment  with axitinib 2 mg BID. He started this on 07/29/22 Because of the drug to drug interaction with posaconazole, his dose is 2 mg p.o. twice daily. This was discontinued on 11/12/22 due to disease progression.    The patient saw Dr. Clarene Duke for further recommendations. Due to DDI with afinitor and his antifungal medication and concerns with tolerability with TKI with lenvatinib, she recommended 120 mg daily of belzutifan. Monitor CBC and CMP at least every 4 weeks, consider ESA to maintain Hbg, and repeat CT scan after 12 weeks due to the response of belzutifan being somewhat delayed.   The patient has not been able to start Belzutifan until around ~01/11/23 due to recurrent hospitalizations and anemia.   The patient was seen with Dr. Arbutus Ped today.  The patient overall is in poor health but he has not noticed any appreciable new adverse side effects since starting Belzutifan.  Dr. Arbutus Ped and the patient previously discussed hospice.  Since the  patient is tolerating treatment fair, the patient is going to continue on his current treatment at this time.  We would typically arrange for restaging imaging after being on this medication for ~2 months.  Of course the patient understands that if he has further decline in his health or unable to tolerate that they would likely revisit the hospice discussion.  His labs today show anemia with a hemoglobin of 7.4.  Will arrange for 2 units of blood to be administered tomorrow.  He will be premedicated with Tylenol.  The patient has intolerance to Benadryl.  He is on hydrocortisone at baseline and does not need any additional steroid premedication.  He will have repeat lab work in 2 weeks to ensure he does not need another blood transfusion.  I will also work on scheduling the IV iron infusions that were ordered prior to the patient being hospitalized.  The patient was agreeable to have this performed at [facility].  I will place standing orders for sample of blood bank should he need any additional blood transfusions in the future.  We would consider blood transfusion to keep his hemoglobin above 8.  I also refilled his potassium.   He is advised to use compression stockings, elevate his lower extremities, and avoid salty foods.  I let the patient know that his CT scan from the hospital did show acute to subacute T10 endplate compression which is likely the etiology of his back pain.  The patient did see palliative care once in the past.  I will reach out to them to see if he needs any follow-up appointments.  The patient will resume taking his Ensure.  He is on Megace for his poor appetite.  The patient was advised to call immediately if she has any concerning symptoms in the interval. The patient voices understanding of current disease status and treatment options and is in agreement with the current care plan. All questions were answered. The patient knows to call the clinic with any problems,  questions or concerns. We can certainly see the patient much sooner if necessary   No orders of the defined types were placed in this encounter.     Aleda Madl L Shaniyah Wix, PA-C 02/01/23  ADDENDUM: Hematology/Oncology Attending: I had a face-to-face encounter with the patient today.  I reviewed his record, lab and recommended his care plan.  This is a very pleasant 77 years old white male with stage IV clear-cell renal cell carcinoma that was initially diagnosed as localized disease in 2016 but had  evidence of metastatic disease to the liver and omentum in 2019.  The patient underwent several treatment in the past including radical right nephrectomy in addition to treatment with immunotherapy with ipilimumab and nivolumab discontinued secondary to adrenal insufficiency and hypopituitarism followed by palliative radiotherapy to abdominal lymph nodes followed by treatment with Cabometyx discontinued secondary to disease progression.  He was then treated with axitinib discontinued in September 2024 secondary to disease progression.  He is currently on treatment with belzutifan 120 mg p.o. daily started January 11, 2023.  He has been tolerating this treatment well with no concerning adverse effect except for his baseline fatigue and weakness and persistent hypokalemia.  The patient had chronic anemia and had been requiring frequent transfusion and hospitalization recently.  He also has been on Lasix for diastolic dysfunction and pleural effusion.  I had a previous discussion with the patient regarding his condition and there was consideration of hospice at some point but the patient was interested in continuing his current treatment with Belzutifan. He will continue this treatment and we will have repeat CT scan of the chest, abdomen and pelvis in 1 months for reevaluation of his disease.  He will have repeat blood work in 2 weeks for close monitoring of his anemia and hypokalemia. The patient and his  daughter agreed to the current plan. The patient was advised to call immediately if he has any other concerning symptoms in the interval. The total time spent in the appointment was 30 minutes.  Disclaimer: This note was dictated with voice recognition software. Similar sounding words can inadvertently be transcribed and may be missed upon review. Lajuana Matte, MD

## 2023-02-02 ENCOUNTER — Other Ambulatory Visit (HOSPITAL_COMMUNITY): Payer: Self-pay

## 2023-02-02 NOTE — Progress Notes (Signed)
Cardiology Office Note:  .   Date:  02/09/2023  ID:  [name], DOB 1945/02/24, MRN [id number] PCP: [name], MD  [facility] Providers Cardiologist:  Thurmon Fair, MD }   History of Present Illness: .   [name] is a 77 y.o. male with a hx of PAF on eliquis, stage IIIa chronic kidney disease, history of renal call carcinoma, history of lung cancer, right nephrectomy, CAD, GERD, HLD, HTN, hypothyroidism.    The patient was recently discharged from the hospital on 01/23/2023 after being admitted for shortness of breath, hypoxia, and worsening lower extremity edema.  He was found to have bilateral pleural effusions, he was recently status post right nephrectomy with mets to the liver and omentum in 2019 followed by oncology.    The patient was diuresed, found to be anemic with a hemoglobin of 6.8, and given 1 unit of packed red blood cells.  The patient Eliquis was discontinued in the setting of recurrent falls but was continued on diltiazem and metoprolol.  He is here today very frail, wearing oxygen via nasal cannula, with significant lower extremity edema bilaterally.  The patient is not eating very much during the day, he does have Ensure supplements which she has not been using recently.  He denies any chest pain, shortness of breath is chronic but not debilitating.  He does use a walker for ambulation but mostly sits in recliner during the day.  He is being followed by oncology and did receive another blood transfusion and is planning on iron transfusion.  ROS: As above otherwise negative  Studies Reviewed: .     Echocardiogram 12/21/2022  1. Left ventricular ejection fraction, by estimation, is 55 to 60%. The  left ventricle has normal function. The left ventricle has no regional  wall motion abnormalities. Left ventricular diastolic parameters are  consistent with Grade I diastolic  dysfunction (impaired relaxation).   2. Right ventricular systolic function  is normal. The right ventricular  size is normal.   3. The mitral valve is normal in structure. Trivial mitral valve  regurgitation. No evidence of mitral stenosis.   4. The aortic valve is tricuspid. There is mild calcification of the  aortic valve. Aortic valve regurgitation is not visualized. Aortic valve  sclerosis/calcification is present, without any evidence of aortic  stenosis.   5. The inferior vena cava is normal in size with greater than 50%  respiratory variability, suggesting right atrial pressure of 3 mmHg.     Physical Exam:   VS:  BP 106/62 (BP Location: Left Arm, Patient Position: Sitting, Cuff Size: Normal)   Pulse 90   Ht 5\' 8"  (1.727 m)   Wt 159 lb (72.1 kg)   SpO2 95%   BMI 24.18 kg/m    Wt Readings from Last 3 Encounters:  02/09/23 159 lb (72.1 kg)  02/03/23 157 lb 12.8 oz (71.6 kg)  01/23/23 158 lb 4.6 oz (71.8 kg)    GEN: Well nourished, well developed frail sitting in a wheelchair. NECK: No JVD; No carotid bruits CARDIAC: RRR, occasional irregular, no murmurs, rubs, gallops RESPIRATORY:  Clear to auscultation without rales, wheezing or rhonchi diminished breath sounds with poor inspiratory effort. ABDOMEN: Soft, non-tender, non-distended EXTREMITIES: Bilateral 2+ to 3+ dependent edema up to the knees,; No deformity   ASSESSMENT AND PLAN: .    Chronic diastolic heart failure: Significant bilateral lower extremity edema and dependent position.  Weight has been stable.  He is not eating very much, I  am going to change his Lasix 20 mg daily to torsemide 20 mg daily, he will start out at 10 mg initially and then move up to 20 mg to evaluate his response to medications and swelling.  He is also to wear compression knee high hose.  He is on diltiazem which can cause significant dependent edema along with diagnosis of anemia.  Last creatinine 0.99.  He will continue potassium supplements as directed.  Most recent potassium was noted at 3.2 on 02/03/2023.  I will see  him again in 1 month and will have a BMET ordered.  2.  Bilateral pleural effusions: On discharge she had significant improvement.  Per chest x-ray on 01/18/2023 the patient had small right pleural effusion.  The patient has poor inspiratory effort I am not hearing any wheezes or rhonchi he remains on oxygen via nasal cannula.  3.  Chronic anemia: Most recent labs on 02/03/2023 demonstrated hemoglobin of 7.4.  Since that time he has had transfusion of 1 unit of packed red blood cells and is going to be starting iron infusions.  Defer to oncology.         Signed, Bettey Mare. Liborio Nixon, ANP, AACC

## 2023-02-03 ENCOUNTER — Other Ambulatory Visit: Payer: Self-pay

## 2023-02-03 ENCOUNTER — Inpatient Hospital Stay: Payer: Medicare HMO | Admitting: Physician Assistant

## 2023-02-03 ENCOUNTER — Telehealth: Payer: Self-pay

## 2023-02-03 ENCOUNTER — Inpatient Hospital Stay: Payer: Medicare HMO | Attending: Oncology

## 2023-02-03 VITALS — BP 103/59 | HR 79 | Temp 98.4°F | Resp 16 | Ht 68.0 in | Wt 157.8 lb

## 2023-02-03 DIAGNOSIS — E611 Iron deficiency: Secondary | ICD-10-CM | POA: Insufficient documentation

## 2023-02-03 DIAGNOSIS — J90 Pleural effusion, not elsewhere classified: Secondary | ICD-10-CM | POA: Insufficient documentation

## 2023-02-03 DIAGNOSIS — E876 Hypokalemia: Secondary | ICD-10-CM | POA: Insufficient documentation

## 2023-02-03 DIAGNOSIS — C649 Malignant neoplasm of unspecified kidney, except renal pelvis: Secondary | ICD-10-CM | POA: Diagnosis not present

## 2023-02-03 DIAGNOSIS — Z905 Acquired absence of kidney: Secondary | ICD-10-CM

## 2023-02-03 DIAGNOSIS — D649 Anemia, unspecified: Secondary | ICD-10-CM

## 2023-02-03 DIAGNOSIS — D509 Iron deficiency anemia, unspecified: Secondary | ICD-10-CM

## 2023-02-03 DIAGNOSIS — D638 Anemia in other chronic diseases classified elsewhere: Secondary | ICD-10-CM

## 2023-02-03 DIAGNOSIS — C641 Malignant neoplasm of right kidney, except renal pelvis: Secondary | ICD-10-CM

## 2023-02-03 LAB — CBC WITH DIFFERENTIAL (CANCER CENTER ONLY)
Abs Immature Granulocytes: 0.1 10*3/uL — ABNORMAL HIGH (ref 0.00–0.07)
Basophils Absolute: 0 10*3/uL (ref 0.0–0.1)
Basophils Relative: 0 %
Eosinophils Absolute: 0 10*3/uL (ref 0.0–0.5)
Eosinophils Relative: 0 %
HCT: 25.2 % — ABNORMAL LOW (ref 39.0–52.0)
Hemoglobin: 7.4 g/dL — ABNORMAL LOW (ref 13.0–17.0)
Immature Granulocytes: 1 %
Lymphocytes Relative: 5 %
Lymphs Abs: 0.9 10*3/uL (ref 0.7–4.0)
MCH: 25.9 pg — ABNORMAL LOW (ref 26.0–34.0)
MCHC: 29.4 g/dL — ABNORMAL LOW (ref 30.0–36.0)
MCV: 88.1 fL (ref 80.0–100.0)
Monocytes Absolute: 0.8 10*3/uL (ref 0.1–1.0)
Monocytes Relative: 4 %
Neutro Abs: 17.4 10*3/uL — ABNORMAL HIGH (ref 1.7–7.7)
Neutrophils Relative %: 90 %
Platelet Count: 366 10*3/uL (ref 150–400)
RBC: 2.86 MIL/uL — ABNORMAL LOW (ref 4.22–5.81)
RDW: 19.1 % — ABNORMAL HIGH (ref 11.5–15.5)
WBC Count: 19.2 10*3/uL — ABNORMAL HIGH (ref 4.0–10.5)
nRBC: 0 % (ref 0.0–0.2)

## 2023-02-03 LAB — CMP (CANCER CENTER ONLY)
ALT: 9 U/L (ref 0–44)
AST: 12 U/L — ABNORMAL LOW (ref 15–41)
Albumin: 2.8 g/dL — ABNORMAL LOW (ref 3.5–5.0)
Alkaline Phosphatase: 246 U/L — ABNORMAL HIGH (ref 38–126)
Anion gap: 8 (ref 5–15)
BUN: 10 mg/dL (ref 8–23)
CO2: 38 mmol/L — ABNORMAL HIGH (ref 22–32)
Calcium: 7.8 mg/dL — ABNORMAL LOW (ref 8.9–10.3)
Chloride: 95 mmol/L — ABNORMAL LOW (ref 98–111)
Creatinine: 0.95 mg/dL (ref 0.61–1.24)
GFR, Estimated: >60 mL/min (ref >60)
Glucose, Bld: 194 mg/dL — ABNORMAL HIGH (ref 70–99)
Potassium: 3.2 mmol/L — ABNORMAL LOW (ref 3.5–5.1)
Sodium: 141 mmol/L (ref 135–145)
Total Bilirubin: 0.4 mg/dL (ref <1.2)
Total Protein: 5.7 g/dL — ABNORMAL LOW (ref 6.5–8.1)

## 2023-02-03 LAB — SAMPLE TO BLOOD BANK

## 2023-02-03 LAB — PREPARE RBC (CROSSMATCH)

## 2023-02-03 LAB — LACTATE DEHYDROGENASE: LDH: 252 U/L — ABNORMAL HIGH (ref 98–192)

## 2023-02-03 MED ORDER — POTASSIUM CHLORIDE 20 MEQ/15ML (10%) PO SOLN: 20.0000 meq | Freq: Two times a day (BID) | ORAL | 0 refills | Status: DC

## 2023-02-03 NOTE — Telephone Encounter (Signed)
Spoke with patients daughter in regards to blood transfusion appt tomorrow at 9.  She confirmed appt.

## 2023-02-04 ENCOUNTER — Other Ambulatory Visit (HOSPITAL_COMMUNITY): Payer: Self-pay

## 2023-02-04 ENCOUNTER — Inpatient Hospital Stay: Payer: Medicare HMO

## 2023-02-04 ENCOUNTER — Encounter: Payer: Self-pay | Admitting: Internal Medicine

## 2023-02-04 ENCOUNTER — Telehealth: Payer: Self-pay | Admitting: Pharmacy Technician

## 2023-02-04 ENCOUNTER — Telehealth: Payer: Self-pay

## 2023-02-04 ENCOUNTER — Encounter: Payer: Self-pay | Admitting: Physician Assistant

## 2023-02-04 DIAGNOSIS — C649 Malignant neoplasm of unspecified kidney, except renal pelvis: Secondary | ICD-10-CM | POA: Diagnosis not present

## 2023-02-04 DIAGNOSIS — D649 Anemia, unspecified: Secondary | ICD-10-CM

## 2023-02-04 DIAGNOSIS — E876 Hypokalemia: Secondary | ICD-10-CM | POA: Diagnosis not present

## 2023-02-04 DIAGNOSIS — Z905 Acquired absence of kidney: Secondary | ICD-10-CM | POA: Diagnosis not present

## 2023-02-04 DIAGNOSIS — J90 Pleural effusion, not elsewhere classified: Secondary | ICD-10-CM | POA: Diagnosis not present

## 2023-02-04 MED ORDER — SODIUM CHLORIDE 0.9% IV SOLUTION
250.0000 mL | INTRAVENOUS | Status: DC
  Administered 2023-02-04: 100 mL via INTRAVENOUS

## 2023-02-04 MED ORDER — ACETAMINOPHEN 325 MG PO TABS
650.0000 mg | ORAL_TABLET | Freq: Once | ORAL | Status: AC
  Administered 2023-02-04: 650 mg via ORAL
  Filled 2023-02-04: qty 2

## 2023-02-04 NOTE — Patient Instructions (Signed)

## 2023-02-04 NOTE — Telephone Encounter (Signed)
Oral Oncology Patient Advocate Encounter  Prior Authorization for [name] has been approved.    PA# [id number] Effective dates: 02/18/2023 through 02/16/24  Patients estimated co-pay is $2,000 for 2025.   Jinger Neighbors, CPhT-Adv Oncology Pharmacy Patient Advocate [facility] Direct Number: [phone]  Fax: [phone]

## 2023-02-04 NOTE — Telephone Encounter (Signed)
Hello, Patient will be scheduled as soon as possible.   Auth Submission: NO AUTH NEEDED Site of care: Site of care: [facility] Payer: aetna medicare Medication & CPT/J Code(s) submitted: Venofer (Iron Sucrose) J1756 Route of submission (phone, fax, portal): portal Phone # Fax # Auth type: Buy/Bill PB Units/visits requested: 200mg , 5 visits Reference number:  Approval from: 02/04/23 to 02/16/23

## 2023-02-04 NOTE — Telephone Encounter (Addendum)
Oral Oncology Patient Advocate Encounter  Was successful in securing patient a $10,000 grant from [employer] to provide copayment coverage for [employer].  This will keep the out of pocket expense at $0.     [employer] ID: [id number]  I have spoken with the patient.   The billing information is as follows and has been shared with [employer].    RxBin: [id number] PCN: [id number] Member ID: [id number] Group ID: [id number] Dates of Eligibility: 01/05/23 through 01/04/24  Fund:  Renal Cell  Jinger Neighbors, CPhT-Adv Oncology Pharmacy Patient Advocate [facility] Direct Number: [phone]  Fax: [phone]

## 2023-02-04 NOTE — Telephone Encounter (Signed)
Oral Oncology Patient Advocate Encounter   Received notification that prior authorization for [name] is due for renewal.   PA submitted on 02/04/23 Key [id number] Status is pending     Jinger Neighbors, CPhT-Adv Oncology Pharmacy Patient Advocate [facility] Direct Number: [phone]  Fax: [phone]

## 2023-02-05 LAB — TYPE AND SCREEN
ABO/RH(D): B POS
Antibody Screen: NEGATIVE
Unit division: 0
Unit division: 0

## 2023-02-05 LAB — BPAM RBC
Blood Product Expiration Date: 202412302359
Blood Product Expiration Date: 202501012359
ISSUE DATE / TIME: 202412190954
ISSUE DATE / TIME: 202412190954
Unit Type and Rh: 7300
Unit Type and Rh: 7300

## 2023-02-09 ENCOUNTER — Encounter: Payer: Self-pay | Admitting: Adult Health

## 2023-02-09 ENCOUNTER — Other Ambulatory Visit (HOSPITAL_COMMUNITY): Payer: Self-pay

## 2023-02-09 ENCOUNTER — Ambulatory Visit: Payer: Medicare HMO | Attending: Adult Health | Admitting: Adult Health

## 2023-02-09 VITALS — BP 106/62 | HR 90 | Ht 68.0 in | Wt 159.0 lb

## 2023-02-09 DIAGNOSIS — D509 Iron deficiency anemia, unspecified: Secondary | ICD-10-CM

## 2023-02-09 DIAGNOSIS — I10 Essential (primary) hypertension: Secondary | ICD-10-CM | POA: Diagnosis not present

## 2023-02-09 DIAGNOSIS — I5032 Chronic diastolic (congestive) heart failure: Secondary | ICD-10-CM

## 2023-02-09 DIAGNOSIS — I119 Hypertensive heart disease without heart failure: Secondary | ICD-10-CM

## 2023-02-09 DIAGNOSIS — I480 Paroxysmal atrial fibrillation: Secondary | ICD-10-CM

## 2023-02-09 MED ORDER — METOPROLOL SUCCINATE ER 50 MG PO TB24
50.0000 mg | ORAL_TABLET | Freq: Every day | ORAL | 3 refills | Status: DC
  Filled 2023-02-09 – 2023-02-15 (×2): qty 30, 30d supply, fill #0

## 2023-02-09 MED ORDER — TORSEMIDE 20 MG PO TABS
10.0000 mg | ORAL_TABLET | Freq: Every day | ORAL | 5 refills | Status: DC
  Filled 2023-02-09: qty 30, 30d supply, fill #0

## 2023-02-09 MED ORDER — TRAZODONE HCL 150 MG PO TABS: 150.0000 mg | ORAL_TABLET | Freq: Every evening | ORAL | 1 refills | Status: DC

## 2023-02-09 MED ORDER — LEVOTHYROXINE SODIUM 150 MCG PO TABS: 150.0000 ug | ORAL_TABLET | Freq: Every day | ORAL | 3 refills | Status: DC

## 2023-02-09 MED ORDER — MEGESTROL ACETATE 20 MG PO TABS
20.0000 mg | ORAL_TABLET | Freq: Every day | ORAL | 1 refills | Status: DC
  Filled 2023-02-15: qty 30, 30d supply, fill #0

## 2023-02-09 MED ORDER — HYDROCORTISONE 5 MG PO TABS: ORAL_TABLET | ORAL | 3 refills | Status: DC

## 2023-02-09 MED ORDER — LEVOTHYROXINE SODIUM 150 MCG PO TABS: 150.0000 ug | ORAL_TABLET | ORAL | 3 refills | Status: DC

## 2023-02-09 MED ORDER — POTASSIUM CHLORIDE ER 20 MEQ PO TBCR
20.0000 meq | EXTENDED_RELEASE_TABLET | Freq: Two times a day (BID) | ORAL | 0 refills | Status: DC
  Filled 2023-02-09: qty 20, 10d supply, fill #0

## 2023-02-09 MED ORDER — MUPIROCIN 2 % EX OINT: TOPICAL_OINTMENT | CUTANEOUS | 1 refills | Status: DC

## 2023-02-09 MED ORDER — FAMOTIDINE 20 MG PO TABS: 20.0000 mg | ORAL_TABLET | Freq: Every day | ORAL | 1 refills | Status: DC | PRN

## 2023-02-09 MED ORDER — EZETIMIBE 10 MG PO TABS: 10.0000 mg | ORAL_TABLET | Freq: Every day | ORAL | 3 refills | Status: DC

## 2023-02-09 NOTE — Patient Instructions (Signed)
Medication Instructions:  STOP LASIX 20 MG DAILY START TORSIMIDE 10 MG (1/2 TABLET) DAILY AND IF IT DOES NOT HELP WITH SWELLING THEN YOU CAN GO TO 20 MG (WHOLE TABLET). DRINK ONE ENSURE DAILY   Lab Work: BMET IN ONE MONTH   Testing/Procedures: NONE   Follow-Up: At [facility], you and your health needs are our priority.  As part of our continuing mission to provide you with exceptional heart care, we have created designated Provider Care Teams.  These Care Teams include your primary Cardiologist (physician) and Advanced Practice Providers (APPs -  Physician Assistants and Nurse Practitioners) who all work together to provide you with the care you need, when you need it.  We recommend signing up for the patient portal called "MyChart".  Sign up information is provided on this After Visit Summary.  MyChart is used to connect with patients for Virtual Visits (Telemedicine).  Patients are able to view lab/test results, encounter notes, upcoming appointments, etc.  Non-urgent messages can be sent to your provider as well.   To learn more about what you can do with MyChart, go to [email or web address].    Your next appointment:   1 month(s)  Provider:   Joni Reining {  Other Instructions: I RECOMMEND YOU TO PURCHASE COMPRESSION HOSE STOCKINGS THAT YOU CAN GET FROM [facility]. WEAR THEM EACH DAILY AND TAKE THEM OFF AT NIGHT.

## 2023-02-11 ENCOUNTER — Other Ambulatory Visit (HOSPITAL_COMMUNITY): Payer: Self-pay

## 2023-02-11 ENCOUNTER — Other Ambulatory Visit: Payer: Self-pay | Admitting: Internal Medicine

## 2023-02-12 ENCOUNTER — Other Ambulatory Visit (HOSPITAL_COMMUNITY): Payer: Self-pay

## 2023-02-12 ENCOUNTER — Other Ambulatory Visit: Payer: Self-pay

## 2023-02-12 DIAGNOSIS — C649 Malignant neoplasm of unspecified kidney, except renal pelvis: Secondary | ICD-10-CM

## 2023-02-12 DIAGNOSIS — E876 Hypokalemia: Secondary | ICD-10-CM

## 2023-02-12 MED ORDER — POTASSIUM CHLORIDE 20 MEQ/15ML (10%) PO SOLN
20.0000 meq | Freq: Two times a day (BID) | ORAL | 0 refills | Status: DC
  Filled 2023-02-12: qty 150, 5d supply, fill #0

## 2023-02-13 MED ORDER — POTASSIUM CHLORIDE ER 20 MEQ PO TBCR
20.0000 meq | EXTENDED_RELEASE_TABLET | Freq: Two times a day (BID) | ORAL | 0 refills | Status: DC
  Filled 2023-02-13: qty 20, 10d supply, fill #0
  Filled ????-??-??: fill #0

## 2023-02-14 ENCOUNTER — Other Ambulatory Visit (HOSPITAL_COMMUNITY): Payer: Self-pay

## 2023-02-15 ENCOUNTER — Inpatient Hospital Stay (HOSPITAL_COMMUNITY)
Admission: EM | Admit: 2023-02-15 | Discharge: 2023-03-20 | DRG: 951 | Disposition: E | Payer: Medicare HMO | Attending: Family Medicine | Admitting: Family Medicine

## 2023-02-15 ENCOUNTER — Emergency Department (HOSPITAL_COMMUNITY): Payer: Medicare HMO

## 2023-02-15 ENCOUNTER — Encounter (HOSPITAL_COMMUNITY): Payer: Self-pay | Admitting: Neurological Surgery

## 2023-02-15 ENCOUNTER — Other Ambulatory Visit: Payer: Self-pay

## 2023-02-15 ENCOUNTER — Other Ambulatory Visit (HOSPITAL_COMMUNITY): Payer: Self-pay

## 2023-02-15 DIAGNOSIS — R58 Hemorrhage, not elsewhere classified: Secondary | ICD-10-CM | POA: Diagnosis not present

## 2023-02-15 DIAGNOSIS — R402142 Coma scale, eyes open, spontaneous, at arrival to emergency department: Secondary | ICD-10-CM | POA: Diagnosis present

## 2023-02-15 DIAGNOSIS — R402342 Coma scale, best motor response, flexion withdrawal, at arrival to emergency department: Secondary | ICD-10-CM | POA: Diagnosis present

## 2023-02-15 DIAGNOSIS — R4182 Altered mental status, unspecified: Secondary | ICD-10-CM | POA: Diagnosis not present

## 2023-02-15 DIAGNOSIS — Z888 Allergy status to other drugs, medicaments and biological substances status: Secondary | ICD-10-CM

## 2023-02-15 DIAGNOSIS — Z79899 Other long term (current) drug therapy: Secondary | ICD-10-CM | POA: Diagnosis not present

## 2023-02-15 DIAGNOSIS — Z781 Physical restraint status: Secondary | ICD-10-CM | POA: Diagnosis not present

## 2023-02-15 DIAGNOSIS — I629 Nontraumatic intracranial hemorrhage, unspecified: Secondary | ICD-10-CM | POA: Diagnosis not present

## 2023-02-15 DIAGNOSIS — Z515 Encounter for palliative care: Secondary | ICD-10-CM | POA: Diagnosis not present

## 2023-02-15 DIAGNOSIS — S0636AA Traumatic hemorrhage of cerebrum, unspecified, with loss of consciousness status unknown, initial encounter: Secondary | ICD-10-CM | POA: Diagnosis present

## 2023-02-15 DIAGNOSIS — K219 Gastro-esophageal reflux disease without esophagitis: Secondary | ICD-10-CM | POA: Diagnosis present

## 2023-02-15 DIAGNOSIS — J449 Chronic obstructive pulmonary disease, unspecified: Secondary | ICD-10-CM

## 2023-02-15 DIAGNOSIS — Z66 Do not resuscitate: Secondary | ICD-10-CM | POA: Diagnosis present

## 2023-02-15 DIAGNOSIS — R29818 Other symptoms and signs involving the nervous system: Secondary | ICD-10-CM | POA: Diagnosis not present

## 2023-02-15 DIAGNOSIS — C641 Malignant neoplasm of right kidney, except renal pelvis: Secondary | ICD-10-CM | POA: Diagnosis not present

## 2023-02-15 DIAGNOSIS — W19XXXA Unspecified fall, initial encounter: Secondary | ICD-10-CM | POA: Diagnosis not present

## 2023-02-15 DIAGNOSIS — I480 Paroxysmal atrial fibrillation: Secondary | ICD-10-CM | POA: Diagnosis not present

## 2023-02-15 DIAGNOSIS — Z79890 Hormone replacement therapy (postmenopausal): Secondary | ICD-10-CM

## 2023-02-15 DIAGNOSIS — R64 Cachexia: Secondary | ICD-10-CM | POA: Diagnosis present

## 2023-02-15 DIAGNOSIS — E785 Hyperlipidemia, unspecified: Secondary | ICD-10-CM | POA: Diagnosis present

## 2023-02-15 DIAGNOSIS — R4781 Slurred speech: Secondary | ICD-10-CM | POA: Diagnosis not present

## 2023-02-15 DIAGNOSIS — I619 Nontraumatic intracerebral hemorrhage, unspecified: Secondary | ICD-10-CM | POA: Diagnosis not present

## 2023-02-15 DIAGNOSIS — S02119A Unspecified fracture of occiput, initial encounter for closed fracture: Secondary | ICD-10-CM | POA: Diagnosis not present

## 2023-02-15 DIAGNOSIS — E271 Primary adrenocortical insufficiency: Secondary | ICD-10-CM | POA: Diagnosis not present

## 2023-02-15 DIAGNOSIS — I6521 Occlusion and stenosis of right carotid artery: Secondary | ICD-10-CM | POA: Diagnosis not present

## 2023-02-15 DIAGNOSIS — F32A Depression, unspecified: Secondary | ICD-10-CM | POA: Diagnosis not present

## 2023-02-15 DIAGNOSIS — I2510 Atherosclerotic heart disease of native coronary artery without angina pectoris: Secondary | ICD-10-CM | POA: Diagnosis present

## 2023-02-15 DIAGNOSIS — Z87891 Personal history of nicotine dependence: Secondary | ICD-10-CM | POA: Diagnosis not present

## 2023-02-15 DIAGNOSIS — I672 Cerebral atherosclerosis: Secondary | ICD-10-CM | POA: Diagnosis not present

## 2023-02-15 DIAGNOSIS — N184 Chronic kidney disease, stage 4 (severe): Secondary | ICD-10-CM | POA: Diagnosis not present

## 2023-02-15 DIAGNOSIS — E039 Hypothyroidism, unspecified: Secondary | ICD-10-CM | POA: Diagnosis present

## 2023-02-15 DIAGNOSIS — M47812 Spondylosis without myelopathy or radiculopathy, cervical region: Secondary | ICD-10-CM | POA: Diagnosis not present

## 2023-02-15 DIAGNOSIS — C649 Malignant neoplasm of unspecified kidney, except renal pelvis: Secondary | ICD-10-CM | POA: Diagnosis present

## 2023-02-15 DIAGNOSIS — D631 Anemia in chronic kidney disease: Secondary | ICD-10-CM | POA: Diagnosis present

## 2023-02-15 DIAGNOSIS — C782 Secondary malignant neoplasm of pleura: Secondary | ICD-10-CM | POA: Diagnosis present

## 2023-02-15 DIAGNOSIS — Y92009 Unspecified place in unspecified non-institutional (private) residence as the place of occurrence of the external cause: Secondary | ICD-10-CM

## 2023-02-15 DIAGNOSIS — J9601 Acute respiratory failure with hypoxia: Secondary | ICD-10-CM | POA: Diagnosis present

## 2023-02-15 DIAGNOSIS — I129 Hypertensive chronic kidney disease with stage 1 through stage 4 chronic kidney disease, or unspecified chronic kidney disease: Secondary | ICD-10-CM | POA: Diagnosis present

## 2023-02-15 DIAGNOSIS — S0634AA Traumatic hemorrhage of right cerebrum with loss of consciousness status unknown, initial encounter: Secondary | ICD-10-CM | POA: Diagnosis present

## 2023-02-15 DIAGNOSIS — Z85118 Personal history of other malignant neoplasm of bronchus and lung: Secondary | ICD-10-CM

## 2023-02-15 DIAGNOSIS — Z905 Acquired absence of kidney: Secondary | ICD-10-CM

## 2023-02-15 DIAGNOSIS — R402232 Coma scale, best verbal response, inappropriate words, at arrival to emergency department: Secondary | ICD-10-CM | POA: Diagnosis present

## 2023-02-15 DIAGNOSIS — Z743 Need for continuous supervision: Secondary | ICD-10-CM | POA: Diagnosis not present

## 2023-02-15 DIAGNOSIS — Z6824 Body mass index (BMI) 24.0-24.9, adult: Secondary | ICD-10-CM

## 2023-02-15 DIAGNOSIS — Z8249 Family history of ischemic heart disease and other diseases of the circulatory system: Secondary | ICD-10-CM

## 2023-02-15 DIAGNOSIS — S06899A Other specified intracranial injury with loss of consciousness of unspecified duration, initial encounter: Secondary | ICD-10-CM | POA: Diagnosis not present

## 2023-02-15 LAB — DIFFERENTIAL
Abs Immature Granulocytes: 0.18 10*3/uL — ABNORMAL HIGH (ref 0.00–0.07)
Basophils Absolute: 0 10*3/uL (ref 0.0–0.1)
Basophils Relative: 0 %
Eosinophils Absolute: 0 10*3/uL (ref 0.0–0.5)
Eosinophils Relative: 0 %
Immature Granulocytes: 1 %
Lymphocytes Relative: 7 %
Lymphs Abs: 1.1 10*3/uL (ref 0.7–4.0)
Monocytes Absolute: 1 10*3/uL (ref 0.1–1.0)
Monocytes Relative: 6 %
Neutro Abs: 14.2 10*3/uL — ABNORMAL HIGH (ref 1.7–7.7)
Neutrophils Relative %: 86 %

## 2023-02-15 LAB — COMPREHENSIVE METABOLIC PANEL
ALT: 17 U/L (ref 0–44)
AST: 27 U/L (ref 15–41)
Albumin: 2.2 g/dL — ABNORMAL LOW (ref 3.5–5.0)
Alkaline Phosphatase: 276 U/L — ABNORMAL HIGH (ref 38–126)
Anion gap: 13 (ref 5–15)
BUN: 10 mg/dL (ref 8–23)
CO2: 35 mmol/L — ABNORMAL HIGH (ref 22–32)
Calcium: 7.9 mg/dL — ABNORMAL LOW (ref 8.9–10.3)
Chloride: 94 mmol/L — ABNORMAL LOW (ref 98–111)
Creatinine, Ser: 1.09 mg/dL (ref 0.61–1.24)
GFR, Estimated: >60 mL/min (ref >60)
Glucose, Bld: 180 mg/dL — ABNORMAL HIGH (ref 70–99)
Potassium: 2.9 mmol/L — ABNORMAL LOW (ref 3.5–5.1)
Sodium: 142 mmol/L (ref 135–145)
Total Bilirubin: 0.4 mg/dL (ref 0.0–1.2)
Total Protein: 6.2 g/dL — ABNORMAL LOW (ref 6.5–8.1)

## 2023-02-15 LAB — I-STAT CHEM 8, ED
BUN: 12 mg/dL (ref 8–23)
Calcium, Ion: 0.95 mmol/L — ABNORMAL LOW (ref 1.15–1.40)
Chloride: 92 mmol/L — ABNORMAL LOW (ref 98–111)
Creatinine, Ser: 1.1 mg/dL (ref 0.61–1.24)
Glucose, Bld: 180 mg/dL — ABNORMAL HIGH (ref 70–99)
HCT: 31 % — ABNORMAL LOW (ref 39.0–52.0)
Hemoglobin: 10.5 g/dL — ABNORMAL LOW (ref 13.0–17.0)
Potassium: 3.3 mmol/L — ABNORMAL LOW (ref 3.5–5.1)
Sodium: 140 mmol/L (ref 135–145)
TCO2: 40 mmol/L — ABNORMAL HIGH (ref 22–32)

## 2023-02-15 LAB — CBC
HCT: 33.1 % — ABNORMAL LOW (ref 39.0–52.0)
Hemoglobin: 9.8 g/dL — ABNORMAL LOW (ref 13.0–17.0)
MCH: 26.8 pg (ref 26.0–34.0)
MCHC: 29.6 g/dL — ABNORMAL LOW (ref 30.0–36.0)
MCV: 90.4 fL (ref 80.0–100.0)
Platelets: 364 10*3/uL (ref 150–400)
RBC: 3.66 MIL/uL — ABNORMAL LOW (ref 4.22–5.81)
RDW: 17.7 % — ABNORMAL HIGH (ref 11.5–15.5)
WBC: 16.5 10*3/uL — ABNORMAL HIGH (ref 4.0–10.5)
nRBC: 0 % (ref 0.0–0.2)

## 2023-02-15 LAB — ETHANOL: Alcohol, Ethyl (B): <10 mg/dL (ref <10)

## 2023-02-15 LAB — PROTIME-INR
INR: 1.1 (ref 0.8–1.2)
Prothrombin Time: 14.8 s (ref 11.4–15.2)

## 2023-02-15 LAB — APTT: aPTT: 32 s (ref 24–36)

## 2023-02-15 LAB — CBG MONITORING, ED: Glucose-Capillary: 135 mg/dL — ABNORMAL HIGH (ref 70–99)

## 2023-02-15 MED ORDER — LORAZEPAM 2 MG/ML IJ SOLN: 1.0000 mg | INTRAMUSCULAR | Status: DC | PRN

## 2023-02-15 MED ORDER — BIOTENE DRY MOUTH MT LIQD: 15.0000 mL | OROMUCOSAL | Status: DC | PRN

## 2023-02-15 MED ORDER — LORAZEPAM 2 MG/ML PO CONC
1.0000 mg | ORAL | Status: DC | PRN
  Filled 2023-02-15: qty 0.5

## 2023-02-15 MED ORDER — POLYVINYL ALCOHOL 1.4 % OP SOLN: 1.0000 [drp] | Freq: Four times a day (QID) | OPHTHALMIC | Status: DC | PRN

## 2023-02-15 MED ORDER — MORPHINE SULFATE (CONCENTRATE) 10 MG /0.5 ML PO SOLN
5.0000 mg | ORAL | Status: DC | PRN
  Filled 2023-02-15: qty 0.5

## 2023-02-15 MED ORDER — ATROPINE SULFATE 1 % OP SOLN
4.0000 [drp] | OPHTHALMIC | Status: DC | PRN
Start: 2023-02-15 — End: 2023-02-17

## 2023-02-15 MED ORDER — ACETAMINOPHEN 650 MG RE SUPP: 650.0000 mg | Freq: Four times a day (QID) | RECTAL | Status: DC | PRN

## 2023-02-15 MED ORDER — IOHEXOL 350 MG/ML SOLN
75.0000 mL | Freq: Once | INTRAVENOUS | Status: AC | PRN
  Administered 2023-02-15: 75 mL via INTRAVENOUS

## 2023-02-15 MED ORDER — LORAZEPAM 1 MG PO TABS: 1.0000 mg | ORAL_TABLET | ORAL | Status: DC | PRN

## 2023-02-15 MED ORDER — SODIUM CHLORIDE 0.9% FLUSH: 3.0000 mL | Freq: Once | INTRAVENOUS | Status: DC

## 2023-02-15 MED ORDER — ONDANSETRON HCL 4 MG/2ML IJ SOLN
4.0000 mg | Freq: Four times a day (QID) | INTRAMUSCULAR | Status: DC | PRN
Start: 2023-02-15 — End: 2023-02-17

## 2023-02-15 MED ORDER — HALOPERIDOL LACTATE 5 MG/ML IJ SOLN: 0.5000 mg | INTRAMUSCULAR | Status: DC | PRN

## 2023-02-15 MED ORDER — ONDANSETRON 4 MG PO TBDP: 4.0000 mg | ORAL_TABLET | Freq: Four times a day (QID) | ORAL | Status: DC | PRN

## 2023-02-15 MED ORDER — MORPHINE SULFATE (PF) 2 MG/ML IV SOLN
1.0000 mg | INTRAVENOUS | Status: DC | PRN
  Administered 2023-02-15 – 2023-02-16 (×2): 2 mg via INTRAVENOUS
  Filled 2023-02-15 (×2): qty 1

## 2023-02-15 MED ORDER — ACETAMINOPHEN 325 MG PO TABS: 650.0000 mg | ORAL_TABLET | Freq: Four times a day (QID) | ORAL | Status: DC | PRN

## 2023-02-15 MED ORDER — HALOPERIDOL 0.5 MG PO TABS: 0.5000 mg | ORAL_TABLET | ORAL | Status: DC | PRN

## 2023-02-15 MED ORDER — HALOPERIDOL LACTATE 2 MG/ML PO CONC: 0.5000 mg | ORAL | Status: DC | PRN

## 2023-02-15 NOTE — Code Documentation (Signed)
Easter Tea is a 77 yr old male with PMH of AF, CKD, Renal cell cancer, HLD arriving to [facility] via [facility] on 02/15/2023. Pt is coming from home where he was LKW at 1600. His daughters returned to the home and found him down on the ground, altered. Family states he was taken off of his Eliquis.    Pt met by stroke team at bridge. Airway cleared by EDP, CBG, labs obtained. Pt to CT with team. NIHSS 11. Pt is alert but disoriented, non-focal. (Please see documentation for NIHSS details and timeline). The following imaging was completed: CT, CTA. Per Dr. Derry Lory, CT shows traumatic ICH as well as skull fracture. Trauma team was activated, and code stroke was cancelled. Follow up per trauma/EDP. Handoff with Clydie Braun TRN complete.

## 2023-02-15 NOTE — Consult Note (Signed)
Reason for Consult:ICH/ fall Referring Physician: EDP  Orin Doo is an 77 y.o. male.   HPI:  77 year old male with metastatic renal cell carcinoma with progression on recent CT scan of the abdomen and hospice care, who was in his usual state of health until he was found on the floor by his daughter when she came home earlier today.  He was brought to the emergency department.  Head CT showed a large right inferior frontal hemorrhage.  The patient cannot cooperate with history and physical and does not answer questions and therefore history is taken from the hospital staff and the chart.  He has become a little more agitated and he is now in restraints and a cervical collar.  There has been mention of intubation.  Past Medical History:  Diagnosis Date   Arthritis    Chronic kidney disease    only has one kidney    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 02/01/2015   Coronary artery disease    followed by Dr.Tilley   Frequent PVCs    GERD (gastroesophageal reflux disease)    Heart murmur    never has caused any problems   Hx of cancer of lung 1980's   Hyperlipidemia    Hypertension    Hypothyroidism    Incisional hernia 08/01/2015   Lung cancer (HCC) 1993   Lung metastases 2019   Pneumonia    x several   Recurrent umbilical hernia 08/01/2015    Past Surgical History:  Procedure Laterality Date   APPENDECTOMY  age 64   ARTERY BIOPSY Left 04/10/2022   Procedure: LEFT TEMPORAL ARTERY BIOPSY;  Surgeon: Chuck Hint, MD;  Location: [facility] OR;  Service: Vascular;  Laterality: Left;   CHOLECYSTECTOMY  10/18/1978   EYE SURGERY Bilateral 2019   cataract   LAPAROSCOPIC LYSIS OF ADHESIONS N/A 08/01/2015   Procedure: LAPAROSCOPIC LYSIS OF ADHESIONS;  Surgeon: Karie Soda, MD;  Location: [facility] ORS;  Service: General;  Laterality: N/A;   LUNG LOBECTOMY  02/16/1990   lung cancer- patient has staples in lung not to have MRI per patient    MAXILLARY ANTROSTOMY  Left 05/22/2022   Procedure: MAXILLARY ANTROSTOMY;  Surgeon: Laren Boom, DO;  Location: [facility] OR;  Service: ENT;  Laterality: Left;   PILONIDAL CYST EXCISION  02/16/2009   Dr Gerrit Friends   ROBOTIC ASSITED PARTIAL NEPHRECTOMY Right 02/01/2015   Procedure: RIGHT ROBOTIC ASSISTED LAPAROSOCOPY NEPHRECTOMY;  Surgeon: Crist Fat, MD;  Location: [facility] ORS;  Service: Urology;  Laterality: Right;   SINUS ENDO WITH FUSION Left 05/22/2022   Procedure: SINUS ENDO WITH FUSION;  Surgeon: Laren Boom, DO;  Location: [facility] OR;  Service: ENT;  Laterality: Left;   VENTRAL HERNIA REPAIR N/A 08/01/2015   Procedure: LAPAROSCOPIC VENTRAL WALL HERNIA WITH MESH;  Surgeon: Karie Soda, MD;  Location: [facility] ORS;  Service: General;  Laterality: N/A;    Allergies  Allergen Reactions   Diphenhydramine Other (See Comments)    Severely restless legs    Social History   Tobacco Use   Smoking status: Former    Current packs/day: 0.00    Average packs/day: 1 pack/day for 20.0 years (20.0 ttl pk-yrs)    Types: Cigarettes    Start date: 01/29/1968    Quit date: 01/29/1988    Years since quitting: 35.0    Passive exposure: Past   Smokeless tobacco: Never  Substance Use Topics   Alcohol use: Not Currently    Comment: rare  Family History  Problem Relation Age of Onset   Heart attack Father      Review of Systems  Positive ROS: Unable to obtain  All other systems have been reviewed and were otherwise negative with the exception of those mentioned in the HPI and as above.  Objective: Vital signs in last 24 hours: Temp:  [98.6 F (37 C)] 98.6 F (37 C) (12/30 1901) Pulse Rate:  [90] 90 (12/30 1901) Resp:  [24] 24 (12/30 1901) BP: (128)/(84) 128/84 (12/30 1840) SpO2:  [88 %-99 %] 99 % (12/30 1901)  General Appearance: Elderly gentleman laying in a gurney, he appears to be protecting his airway and is wide-awake Head: Normocephalic, without obvious abnormality, atraumatic Eyes: PERRL, gaze  conjugate    Neck: Supple, symmetrical, trachea midline Lungs: , respirations unlabored Heart: Regular rate and rhythm Abdomen: Soft Extremities: Extremities normal, atraumatic, no cyanosis or edema   NEUROLOGIC:   Mental status: He is awake and there is some verbalization but he is not really following commands though he is aware of my presence, cannot test memory or fund of knowledge. Motor Exam -he seems to be moving his extremities equally, now in restraints Sensory Exam -unable to test Reflexes: symmetric, no pathologic reflexes, No Hoffman's, No clonus Coordination -unable to test Gait -not tested Balance -not tested Cranial Nerves: I: smell Not tested  II: visual acuity  OS: na  OD: na  II: visual fields Full to confrontation  II: pupils Equal, round, reactive to light  III,VII: ptosis none  III,IV,VI: extraocular muscles    V: mastication   V: facial light touch sensation    V,VII: corneal reflex  present  VII: facial muscle function - upper    VII: facial muscle function - lower   VIII: hearing   IX: soft palate elevation    IX,X: gag reflex   XI: trapezius strength    XI: sternocleidomastoid strength   XI: neck flexion strength    XII: tongue strength      Data Review Lab Results  Component Value Date   WBC 16.5 (H) 02/15/2023   HGB 10.5 (L) 02/15/2023   HCT 31.0 (L) 02/15/2023   MCV 90.4 02/15/2023   PLT 364 02/15/2023   Lab Results  Component Value Date   NA 140 02/15/2023   K 3.3 (L) 02/15/2023   CL 92 (L) 02/15/2023   CO2 35 (H) 02/15/2023   BUN 12 02/15/2023   CREATININE 1.10 02/15/2023   GLUCOSE 180 (H) 02/15/2023   Lab Results  Component Value Date   INR 1.1 02/15/2023    Radiology: CT ANGIO HEAD NECK W WO CM (CODE STROKE) Result Date: 02/15/2023 CLINICAL DATA:  Neuro deficit, acute, stroke suspected EXAM: CT ANGIOGRAPHY HEAD AND NECK WITH AND WITHOUT CONTRAST TECHNIQUE: Multidetector CT imaging of the head and neck was performed using  the standard protocol during bolus administration of intravenous contrast. Multiplanar CT image reconstructions and MIPs were obtained to evaluate the vascular anatomy. Carotid stenosis measurements (when applicable) are obtained utilizing NASCET criteria, using the distal internal carotid diameter as the denominator. RADIATION DOSE REDUCTION: This exam was performed according to the departmental dose-optimization program which includes automated exposure control, adjustment of the mA and/or kV according to patient size and/or use of iterative reconstruction technique. CONTRAST:  75mL OMNIPAQUE IOHEXOL 350 MG/ML SOLN COMPARISON:  Same day CT head. FINDINGS: CTA NECK FINDINGS Aortic arch: Great vessel origins are patent.  Atherosclerosis. Right carotid system: Atherosclerosis at the carotid bifurcation without  greater than 50% stenosis. Left carotid system: The proximal common carotid artery is obscured by streak artifact. No visible significant (greater than 50%) stenosis. Vertebral arteries: Left dominant. Both vertebral arteries are patent without hemodynamically significant (greater than 50%) stenosis. Skeleton: Please see separately dictated CT of the cervical spine for evaluation of the cervical spine. Other neck: Please see separately dictated CT of the cervical spine for further evaluation. Upper chest: Visualized lung apices are clear. Small bilateral pleural effusions. Review of the MIP images confirms the above findings CTA HEAD FINDINGS Anterior circulation: Blush of contrast in the extra-axial inferior right frontal hemorrhage (series 3, image 115), compatible without active extravasation. No evidence of aneurysm or arteriovenous malformation. Bilateral intracranial ICAs, MCAs, and ACAs are patent without proximal hemodynamically significant stenosis. The ACAs are displaced to the left by the hemorrhage. Posterior circulation: Bilateral intradural vertebral arteries, basilar artery, and bilateral  posterior cerebral arteries are patent without proximal hemodynamically significant stenosis. No aneurysm identified. Venous sinuses: As permitted by contrast timing, patent. Review of the MIP images confirms the above findings IMPRESSION: 1. Focus of active extravasation in the extra-axial inferior right frontal hemorrhage. 2. No large vessel occlusion or proximal hemodynamically significant stenosis. Findings discussed with Dr. Derry Lory via telephone at 7:06 p.m. Electronically Signed   By: Feliberto Harts M.D.   On: 02/15/2023 19:09   CT HEAD CODE STROKE WO CONTRAST Result Date: 02/15/2023 CLINICAL DATA:  Code stroke. Neuro deficit, acute, stroke suspected. Patient reportedly fell and hit the back of his head. Trauma. EXAM: CONFUSION.: EXAM: CONFUSION. CT HEAD WITHOUT CONTRAST TECHNIQUE: Contiguous axial images were obtained from the base of the skull through the vertex without intravenous contrast. RADIATION DOSE REDUCTION: This exam was performed according to the departmental dose-optimization program which includes automated exposure control, adjustment of the mA and/or kV according to patient size and/or use of iterative reconstruction technique. COMPARISON:  CT head December 20, 2022. FINDINGS: Motion limited study.  Within this limitation: Brain: Acute likely traumatic extra-axial hemorrhage along the right inferior frontal convexity along the anterior middle cranial fossa and extending posteriorly along the anterior right temporal lobe and along the right tentorial leaflet. This hemorrhage measures up to 2.5 cm in thickness with associated mass effect and 4 mm of leftward midline shift. Mild hypoattenuation in the inferior left frontal lobe in a region of motion and streak artifact. No evidence of acute large vascular territory infarct. No visible mass lesion. Basal cisterns are patent. No hydrocephalus. Vascular: No hyperdense vessel identified. Skull: Nondisplaced fracture of the occipital bone  which extends inferiorly to the occiput. Sinuses/Orbits: Clear sinuses.  No acute orbital findings. ASPECTS [facility] Stroke Program Early CT Score) total score (0-10 with 10 being normal): 10. IMPRESSION: 1. Acute and likely traumatic large (2.5 cm thick) extra-axial hemorrhage along the right inferior frontal convexity / anterior middle cranial fossa, which extends posteriorly along the anterior right temporal convexity and right tentorial leaflet. Approximately 4 mm of leftward midline shift. Recommend neurosurgical consultation. 2. Nondisplaced fracture of the midline occipital bone which extends inferiorly to the occiput. 3. Mild hypoattenuation in the inferior left frontal lobe may be in part artifactual given streak and motion artifact in region, but merits attention on short interval follow-up to exclude developing intraparenchymal contusion. 4. No evidence of acute large vascular territory infarct. Code stroke imaging results were communicated on 02/15/2023 at 6:45 pm to provider Dr. Derry Lory via telephone, who verbally acknowledged these results. Electronically Signed   By: Juluis Mire.D.  On: 02/15/2023 18:58     Assessment/Plan: Estimated body mass index is 24.18 kg/m as calculated from the following:   Height as of 02/09/23: 5\' 8"  (1.727 m).   Weight as of 02/09/23: 72.1 kg.   Unfortunate 77 year old gentleman with stage IV RCC with metastasis to the pancreas and lymph nodes and Peri pancreatic tissues and possibly the pleura receiving hospice care who was found down at home by his daughter.  CT scan shows a large right inferior frontal hemorrhage with mass effect.  I have spoken with his daughter at length.  Given the fact that he is in hospice care and has widely metastatic cancer, we would not recommend aggressive surgical intervention.  We recommend DNR/DNI and comfort care.  She is in agreement and is on her way to the hospital.  The other daughter is also on her way.  Please  let us know if we can be of any assistance.   Tia Alert 02/15/2023 7:38 PM

## 2023-02-15 NOTE — Consult Note (Addendum)
Briefly, [name] is a 77 y.o. male who had a fall at home in the afternoon and was not himself afterwards. Per EMS, at baseline, daughter had reported to them that patient is A + O x 3, walks with his walker. He was taken off his eliquis. He had a fall in the afternoon and was found on the ground by family, had a bump on the back of his head. He was oriented to self, withdrawn and dazed and not very communicative. He was brought in as a code stroke for concern for aphasia vs encephalopathy. He was not in a C collar on arrival.  On exam, patient is oriented to self, eyes open, makes eye contact, bradyphrenic and not following commands despite encouragement/coaxing. Pupils are equal round and reactive to light. Arms drift to the floor when held up off the bed. Localizes in BL lower extremities.  CT Head with a large R frontal likely extraaxial hematoma with occipital bone fracture.  On imaging, it was apparent that this is a traumatic brain injury rather than an acute stroke. Code stroke was cancelled. Patient was placed in a C collar. CTA with noted active extravasation into the extra-axial R frontal hematoma.  ED Provider was notified of the CT and CTA findings.  Patient is not a candidate for tnkase or thrombectomy due to low suspicion for stroke and no LVO.  NIHSS components Score: Comment  1a Level of Conscious 0[]  1[x]  2[]  3[]      1b LOC Questions 0[]  1[]  2[x]       1c LOC Commands 0[]  1[x]  2[]       2 Best Gaze 0[x]  1[]  2[]       3 Visual 0[x]  1[]  2[]  3[]      4 Facial Palsy 0[x]  1[]  2[]  3[]      5a Motor Arm - left 0[]  1[x]  2[]  3[]  4[]  UN[]    5b Motor Arm - Right 0[]  1[x]  2[]  3[]  4[]  UN[]    6a Motor Leg - Left 0[]  1[]  2[]  3[x]  4[]  UN[]    6b Motor Leg - Right 0[]  1[]  2[]  3[x]  4[]  UN[]    7 Limb Ataxia 0[x]  1[]  2[]  3[]  UN[]     8 Sensory 0[x]  1[]  2[]  UN[]      9 Best Language 0[]  1[x]  2[]  3[]      10 Dysarthria 0[]  1[x]  2[]  UN[]      11 Extinct. and Inattention 0[x]  1[]  2[]       TOTAL:  14     This patient is critically ill and at significant risk of neurological worsening, death and care requires constant monitoring of vital signs, hemodynamics,respiratory and cardiac monitoring, neurological assessment, discussion with family, other specialists and medical decision making of high complexity. I spent 35 minutes of neurocritical care time  in the care of  this patient. This was time spent independent of any time provided by nurse practitioner or PA.  Erick Blinks [facility] 02/15/2023  7:31 PM  We will signoff. We will be available as needed.  Erick Blinks [facility]

## 2023-02-15 NOTE — ED Provider Notes (Signed)
[place] EMERGENCY DEPARTMENT AT [facility] Provider Note   CSN: [id number] Arrival date & time: 02/15/23  3244  An emergency department physician performed an initial assessment on this suspected stroke patient at 45.  History Chief Complaint  Patient presents with   Fall   Altered Mental Status    HPI [name] is a 77 y.o. male presenting for fall and altered mental status.  Patient initially activated as a code stroke via EMS.  Neurology evaluated patient at the bridge and patient taken back as a code stroke. During the initial code stroke imaging, severe brain bleed was identified as well as possible traumatic fracture. Upgraded to a level 2 trauma.  Unfortunately his mental status continued to decline and upgraded as a level 1 trauma. Patient provide no history I called both daughters for further collateral, they state that the patient has had progressive cancer and is currently following with palliative care in the outpatient setting.  He was found down likely with a fall.  His mental that is baseline is communicative but currently he is grossly confused barely able to repeat his name.  Patient can provide no meaningful history Patient's recorded medical, surgical, social, medication list and allergies were reviewed in the Snapshot window as part of the initial history.   Review of Systems   Review of Systems  Constitutional:  Negative for chills and fever.  HENT:  Negative for ear pain and sore throat.   Eyes:  Negative for pain and visual disturbance.  Respiratory:  Negative for cough and shortness of breath.   Cardiovascular:  Negative for chest pain and palpitations.  Gastrointestinal:  Negative for abdominal pain and vomiting.  Genitourinary:  Negative for dysuria and hematuria.  Musculoskeletal:  Negative for arthralgias and back pain.  Skin:  Negative for color change and rash.  Neurological:  Negative for seizures and syncope.  All other systems  reviewed and are negative.   Physical Exam Updated Vital Signs BP (!) 158/90   Pulse 80   Temp 98.6 F (37 C) (Axillary)   Resp 16   SpO2 100%  Physical Exam Vitals and nursing note reviewed.  Constitutional:      General: He is not in acute distress.    Appearance: He is well-developed.  HENT:     Head: Normocephalic and atraumatic.  Eyes:     Conjunctiva/sclera: Conjunctivae normal.  Cardiovascular:     Rate and Rhythm: Normal rate and regular rhythm.     Heart sounds: No murmur heard. Pulmonary:     Effort: Pulmonary effort is normal. No respiratory distress.     Breath sounds: Normal breath sounds.  Abdominal:     Palpations: Abdomen is soft.     Tenderness: There is no abdominal tenderness.  Musculoskeletal:        General: No swelling.     Cervical back: Neck supple.  Skin:    General: Skin is warm and dry.     Capillary Refill: Capillary refill takes less than 2 seconds.  Neurological:     Mental Status: He is alert.  Psychiatric:        Mood and Affect: Mood normal.     ED Course/ Medical Decision Making/ A&P    Procedures .Critical Care  Performed by: Glyn Ade, MD Authorized by: Glyn Ade, MD   Critical care provider statement:    Critical care time (minutes):  100   Critical care was necessary to treat or prevent imminent or life-threatening deterioration  of the following conditions:  CNS failure or compromise and trauma   Critical care was time spent personally by me on the following activities:  Development of treatment plan with patient or surrogate, discussions with consultants, evaluation of patient's response to treatment, examination of patient, ordering and review of laboratory studies, ordering and review of radiographic studies, ordering and performing treatments and interventions, pulse oximetry, re-evaluation of patient's condition and review of old charts   Care discussed with: admitting provider      Medications Ordered  in ED Medications  sodium chloride flush (NS) 0.9 % injection 3 mL (has no administration in time range)  iohexol (OMNIPAQUE) 350 MG/ML injection 75 mL (75 mLs Intravenous Contrast Given 02/15/23 1858)    Medical Decision Making:   I was called immediately to patient's bedside for evaluation.  Initially he was brought in for altered mental status as above as a code stroke.  However the neurologist rapidly asked me to come review the images as his presentation likely more consistently represents traumatic pathology with the findings on CT.  The findings on CT triggered escalation of patient to a level 1 trauma.  Immediately consulted neurosurgery for extra-axial intracranial bleeding with clinical signs of mental status deterioration.  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  Multiple attempts to get a hold of patient's family were finally successful.  They were in agreement with getting neurosurgical consultation prior to any aggressive interventions.  Neurosurgical consultation revealed that there is likely no intervention that would be beneficial for the patient given their current clinical status and complex medical comorbidities. Family arrived to bedside after being informed by the neurosurgical team that there was no intervention that would be beneficial. Long conversation with both the patient's daughters and patient's sons.  They would like to make the patient DNR at this time given the severity of his presentation. Uncertain outpatient will proceed overnight whether he continues to have active extravasation, gradual swelling and herniation or if he is ultimately able to accommodate the degree of bleeding.  Consulted medicine for observational care overnight for  ultimate referral to hospice if patient survives the immediate injury. Extensive time at bedside, medications administered to keep patient comfortable.  Patient ultimately arranged for admission at this time.    clinical Impression:  1. Primary adrenocortical insufficiency (HCC) Chronic  2. Chronic obstructive pulmonary disease, unspecified COPD type (HCC) Chronic  3. CKD (chronic kidney disease), stage IV (HCC) Chronic     Data Unavailable   Final Clinical Impression(s) / ED Diagnoses Final diagnoses:  Primary adrenocortical insufficiency (HCC)  Chronic obstructive pulmonary disease, unspecified COPD type (HCC)  CKD (chronic kidney disease), stage IV (HCC)    Rx / DC Orders ED Discharge Orders     None         Glyn Ade, MD 02/15/23 2315

## 2023-02-15 NOTE — H&P (Signed)
History and Physical    [name] YNW:[id number] DOB: [birth year] DOA: 02/15/2023  PCP: [name], MD   Patient coming from: Home   Chief Complaint: Found down, confused   HPI: [name] is a 77 y.o. male with medical history significant for renal cell carcinoma with metastases, atrial fibrillation no longer anticoagulated, hypertension, hypothyroidism, and CAD who now presents with confusion and lethargy after he was found down at home.  Patient had been seen in his usual state approximately 4 PM but was then found on the floor by family.  He was confused and slow to respond.  ED Course: Upon arrival to the ED, patient is found to be oriented to self only and not following instructions.  Head CT is concerning for large traumatic intracerebral hemorrhage with leftward shift.  Patient was seen by neurosurgery and trauma surgery in the ED who both recommend comfort care.  Case was discussed with both of the patient's daughters and his grandson at the bedside; they agree with just focusing on the patient's comfort at this point.   Review of Systems:  Unable to complete ROS secondary to patient's clinical condition.  Past Medical History:  Diagnosis Date   Arthritis    Chronic kidney disease    only has one kidney    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 02/01/2015   Coronary artery disease    followed by Dr.Tilley   Frequent PVCs    GERD (gastroesophageal reflux disease)    Heart murmur    never has caused any problems   Hx of cancer of lung 1980's   Hyperlipidemia    Hypertension    Hypothyroidism    Incisional hernia 08/01/2015   Lung cancer (HCC) 1993   Lung metastases 2019   Pneumonia    x several   Recurrent umbilical hernia 08/01/2015    Past Surgical History:  Procedure Laterality Date   APPENDECTOMY  age 60   ARTERY BIOPSY Left 04/10/2022   Procedure: LEFT TEMPORAL ARTERY BIOPSY;  Surgeon: Chuck Hint, MD;   Location: [facility] OR;  Service: Vascular;  Laterality: Left;   CHOLECYSTECTOMY  10/18/1978   EYE SURGERY Bilateral 2019   cataract   LAPAROSCOPIC LYSIS OF ADHESIONS N/A 08/01/2015   Procedure: LAPAROSCOPIC LYSIS OF ADHESIONS;  Surgeon: Karie Soda, MD;  Location: [facility] ORS;  Service: General;  Laterality: N/A;   LUNG LOBECTOMY  02/16/1990   lung cancer- patient has staples in lung not to have MRI per patient    MAXILLARY ANTROSTOMY Left 05/22/2022   Procedure: MAXILLARY ANTROSTOMY;  Surgeon: Laren Boom, DO;  Location: [facility] OR;  Service: ENT;  Laterality: Left;   PILONIDAL CYST EXCISION  02/16/2009   Dr Gerrit Friends   ROBOTIC ASSITED PARTIAL NEPHRECTOMY Right 02/01/2015   Procedure: RIGHT ROBOTIC ASSISTED LAPAROSOCOPY NEPHRECTOMY;  Surgeon: Crist Fat, MD;  Location: [facility] ORS;  Service: Urology;  Laterality: Right;   SINUS ENDO WITH FUSION Left 05/22/2022   Procedure: SINUS ENDO WITH FUSION;  Surgeon: Laren Boom, DO;  Location: [facility] OR;  Service: ENT;  Laterality: Left;   VENTRAL HERNIA REPAIR N/A 08/01/2015   Procedure: LAPAROSCOPIC VENTRAL WALL HERNIA WITH MESH;  Surgeon: Karie Soda, MD;  Location: [facility] ORS;  Service: General;  Laterality: N/A;    Social History:   reports that he quit smoking about 35 years ago. His smoking use included cigarettes. He started smoking about 55 years ago. He has a 20 pack-year smoking history. He  has been exposed to tobacco smoke. He has never used smokeless tobacco. He reports that he does not currently use alcohol. He reports that he does not use drugs.  Allergies  Allergen Reactions   Diphenhydramine Other (See Comments)    Severely restless legs    Family History  Problem Relation Age of Onset   Heart attack Father      Prior to Admission medications   Medication Sig Start Date End Date Taking? Authorizing Provider  acetaminophen (TYLENOL) 500 MG tablet Take 1,000 mg by mouth as needed for mild pain (pain score 1-3) or moderate pain (pain  score 4-6).    [provider]  ADVIL 200 MG CAPS Take 400 mg by mouth every 6 (six) hours as needed (for mild pain).    [provider]  Cholecalciferol (VITAMIN D3 PO) Take 1 capsule by mouth at bedtime.    [provider]  diazepam (VALIUM) 2 MG tablet TAKE 1-2 TABLETS BY MOUTH EVERY 8 HOURS AS NEEDED FOR ANXIETY Patient taking differently: Take 2-4 mg by mouth every 8 (eight) hours as needed for anxiety. 01/09/23   Pickenpack-Cousar, Arty Baumgartner, NP  diltiazem (CARDIZEM CD) 240 MG 24 hr capsule Take 1 capsule (240 mg total) by mouth in the morning. 03/26/22   Croitoru, Mihai, MD  ezetimibe (ZETIA) 10 MG tablet Take 1 tablet (10 mg total) by mouth daily. 03/27/22     famotidine (PEPCID) 20 MG tablet Take 20 mg by mouth daily in the afternoon. 09/15/22   [provider]  famotidine (PEPCID) 20 MG tablet Take 1 tablet (20 mg total) by mouth every 24 hours as needed for stomach acid. 09/30/22     hydrocortisone (CORTEF) 5 MG tablet Take 1 tablet (5 mg total) by mouth See admin instructions. Daily at 3pm. Patient taking differently: Take 5-20 mg by mouth See admin instructions. Take 15 mg by mouth in the morning and 5 mg at 3 PM daily 12/22/22   Leeroy Bock, MD  hydrocortisone (CORTEF) 5 MG tablet Take 3 tablets (15 mg total) by mouth every morning AND 1 tablet (5 mg total) every evening. 08/03/22     levothyroxine (SYNTHROID) 150 MCG tablet Take 1 tablet (150 mcg total) by mouth daily before breakfast. 01/23/23   Lewie Chamber, MD  levothyroxine (SYNTHROID) 150 MCG tablet Take 1 tablet (150 mcg total) by mouth every other day. 07/21/22     levothyroxine (SYNTHROID) 150 MCG tablet Take 1 tablet (150 mcg total) by mouth daily before breakfast. 08/03/22   Carlus Pavlov, MD  levothyroxine (SYNTHROID) 150 MCG tablet Take 1 tablet (150 mcg total) by mouth daily before breakfast. 01/23/23   Lewie Chamber, MD  loratadine (CLARITIN) 10 MG tablet Take 10 mg by mouth daily.     [provider]  megestrol (MEGACE) 20 MG tablet TAKE 1 TABLET BY MOUTH DAILY 12/26/22   Heilingoetter, Cassandra L, PA-C  megestrol (MEGACE) 20 MG tablet Take 1 tablet (20 mg total) by mouth daily. 12/26/22   Heilingoetter, Cassandra L, PA-C  metoprolol succinate (TOPROL-XL) 50 MG 24 hr tablet Take 1 tablet (50 mg total) by mouth daily. Take with or immediately following a meal. 01/24/23   Lewie Chamber, MD  metoprolol succinate (TOPROL-XL) 50 MG 24 hr tablet Take 1 tablet (50 mg total) by mouth daily with a meal. 01/23/23   Lewie Chamber, MD  Multiple Vitamins-Minerals (CENTRUM SILVER 50+MEN) TABS Take 1 tablet by mouth at bedtime.    [provider]  mupirocin ointment (BACTROBAN) 2 % Place 1 Application into the nose daily as needed (sores).    [provider]  mupirocin ointment (BACTROBAN) 2 % Apply a pea size amount with finger to each nostril twice daily for 21 days. 06/15/22     potassium chloride 20 MEQ/15ML (10%) SOLN Take 15 mLs (20 mEq total) by mouth 2 (two) times daily. 02/12/23   Si Gaul, MD  Potassium Chloride ER 20 MEQ TBCR Take 1 tablet (20 mEq total) by mouth 2 (two) times daily. 02/13/23   Si Gaul, MD  torsemide (DEMADEX) 20 MG tablet Take 1/2 tablet (10 mg) by mouth daily. IF swelling does not go down, then you can take 1 tablet (20 mg) daily. 02/09/23   Jodelle Gross, NP  traZODone (DESYREL) 150 MG tablet Take 1 tablet (150 mg total) by mouth every evening. 08/05/22     WELIREG 40 MG tablet Take 120 mg by mouth daily.    [provider]    Physical Exam: Vitals:   02/15/23 1840 02/15/23 1859 02/15/23 1901 02/15/23 1915  BP: 128/84   (!) 158/90  Pulse:   90 80  Resp:   (!) 24 16  Temp:   98.6 F (37 C)   TempSrc:   Axillary   SpO2:  (!) 88% 99% 100%     Constitutional: NAD, no pallor or diaphoresis   Eyes: PERTLA, lids and conjunctivae normal ENMT: Mucous membranes are moist. Posterior pharynx clear of any  exudate or lesions.   Neck: supple, no masses  Respiratory: coarse rhonchi. No accessory muscle use.  Cardiovascular: S1 & S2 heard, regular rate and rhythm. No JVD. Abdomen: No distension, no tenderness, soft. Bowel sounds active.  Musculoskeletal: no clubbing / cyanosis. No joint deformity upper and lower extremities.   Skin: no significant rashes, lesions, ulcers. Warm, dry, well-perfused. Neurologic: CN 2-12 grossly intact. Moving all extremities. Restless, disoriented.     Labs and Imaging on Admission: I have personally reviewed following labs and imaging studies  CBC: Recent Labs  Lab 02/15/23 1835 02/15/23 1842  WBC 16.5*  --   NEUTROABS 14.2*  --   HGB 9.8* 10.5*  HCT 33.1* 31.0*  MCV 90.4  --   PLT 364  --    Basic Metabolic Panel: Recent Labs  Lab 02/15/23 1835 02/15/23 1842  NA 142 140  K 2.9* 3.3*  CL 94* 92*  CO2 35*  --   GLUCOSE 180* 180*  BUN 10 12  CREATININE 1.09 1.10  CALCIUM 7.9*  --    GFR: Estimated Creatinine Clearance: 52.7 mL/min (by C-G formula based on SCr of 1.1 mg/dL). Liver Function Tests: Recent Labs  Lab 02/15/23 1835  AST 27  ALT 17  ALKPHOS 276*  BILITOT 0.4  PROT 6.2*  ALBUMIN 2.2*   No results for input(s): "LIPASE", "AMYLASE" in the last 168 hours. No results for input(s): "AMMONIA" in the last 168 hours. Coagulation Profile: Recent Labs  Lab 02/15/23 1835  INR 1.1   Cardiac Enzymes: No results for input(s): "CKTOTAL", "CKMB", "CKMBINDEX", "TROPONINI" in the last 168 hours. BNP (last 3 results) No results for input(s): "PROBNP" in the last 8760 hours. HbA1C: No results for input(s): "HGBA1C" in the last 72 hours. CBG: Recent Labs  Lab 02/15/23 1834  GLUCAP 135*   Lipid Profile: No results for input(s): "CHOL", "HDL", "LDLCALC", "TRIG", "CHOLHDL", "LDLDIRECT" in the last 72 hours. Thyroid Function Tests: No results for input(s): "TSH", "T4TOTAL", "FREET4", "T3FREE", "THYROIDAB" in the  last 72 hours. Anemia  Panel: No results for input(s): "VITAMINB12", "FOLATE", "FERRITIN", "TIBC", "IRON", "RETICCTPCT" in the last 72 hours. Urine analysis:    Component Value Date/Time   COLORURINE YELLOW 12/20/2022 1145   APPEARANCEUR CLEAR 12/20/2022 1145   LABSPEC 1.017 12/20/2022 1145   PHURINE 6.0 12/20/2022 1145   GLUCOSEU 50 (A) 12/20/2022 1145   HGBUR NEGATIVE 12/20/2022 1145   BILIRUBINUR NEGATIVE 12/20/2022 1145   KETONESUR NEGATIVE 12/20/2022 1145   PROTEINUR 100 (A) 12/20/2022 1145   UROBILINOGEN 0.2 11/12/2009 1000   NITRITE NEGATIVE 12/20/2022 1145   LEUKOCYTESUR NEGATIVE 12/20/2022 1145   Sepsis Labs: @LABRCNTIP (procalcitonin:4,lacticidven:4) )No results found for this or any previous visit (from the past 240 hours).   Radiological Exams on Admission: CT C-SPINE NO CHARGE Result Date: 02/15/2023 CLINICAL DATA:  Neurologic deficit, stroke suspected EXAM: CT CERVICAL SPINE WITHOUT CONTRAST TECHNIQUE: Multidetector CT imaging of the cervical spine was performed without intravenous contrast. Multiplanar CT image reconstructions were also generated. RADIATION DOSE REDUCTION: This exam was performed according to the departmental dose-optimization program which includes automated exposure control, adjustment of the mA and/or kV according to patient size and/or use of iterative reconstruction technique. COMPARISON:  12/20/2022 FINDINGS: Alignment: Alignment is grossly anatomic. Skull base and vertebrae: Nondisplaced longitudinal fracture through the midline of the occipital bone. No acute cervical spine fractures. No destructive bony abnormalities. Soft tissues and spinal canal: No prevertebral fluid or swelling. No visible canal hematoma. Please refer to separately reported CT angiography exam for description of vascular findings. Disc levels: Prominent facet hypertrophy at C2-3, C3-4, and C5-6. There is mild spondylosis at C5-6 and C6-7. Upper chest: Airway is patent.  Trace right pleural effusion.  Other: Reconstructed images demonstrate no additional findings. IMPRESSION: 1. No acute cervical spine fracture. 2. Nondisplaced longitudinal fracture through the midline of the occipital bone. 3. Please refer to separately reported CT angiography exam for description of vascular findings. Electronically Signed   By: Sharlet Salina M.D.   On: 02/15/2023 20:38   CT ANGIO HEAD NECK W WO CM (CODE STROKE) Result Date: 02/15/2023 CLINICAL DATA:  Neuro deficit, acute, stroke suspected EXAM: CT ANGIOGRAPHY HEAD AND NECK WITH AND WITHOUT CONTRAST TECHNIQUE: Multidetector CT imaging of the head and neck was performed using the standard protocol during bolus administration of intravenous contrast. Multiplanar CT image reconstructions and MIPs were obtained to evaluate the vascular anatomy. Carotid stenosis measurements (when applicable) are obtained utilizing NASCET criteria, using the distal internal carotid diameter as the denominator. RADIATION DOSE REDUCTION: This exam was performed according to the departmental dose-optimization program which includes automated exposure control, adjustment of the mA and/or kV according to patient size and/or use of iterative reconstruction technique. CONTRAST:  75mL OMNIPAQUE IOHEXOL 350 MG/ML SOLN COMPARISON:  Same day CT head. FINDINGS: CTA NECK FINDINGS Aortic arch: Great vessel origins are patent.  Atherosclerosis. Right carotid system: Atherosclerosis at the carotid bifurcation without greater than 50% stenosis. Left carotid system: The proximal common carotid artery is obscured by streak artifact. No visible significant (greater than 50%) stenosis. Vertebral arteries: Left dominant. Both vertebral arteries are patent without hemodynamically significant (greater than 50%) stenosis. Skeleton: Please see separately dictated CT of the cervical spine for evaluation of the cervical spine. Other neck: Please see separately dictated CT of the cervical spine for further evaluation.  Upper chest: Visualized lung apices are clear. Small bilateral pleural effusions. Review of the MIP images confirms the above findings CTA HEAD FINDINGS Anterior circulation: Blush of contrast in the extra-axial  inferior right frontal hemorrhage (series 3, image 115), compatible without active extravasation. No evidence of aneurysm or arteriovenous malformation. Bilateral intracranial ICAs, MCAs, and ACAs are patent without proximal hemodynamically significant stenosis. The ACAs are displaced to the left by the hemorrhage. Posterior circulation: Bilateral intradural vertebral arteries, basilar artery, and bilateral posterior cerebral arteries are patent without proximal hemodynamically significant stenosis. No aneurysm identified. Venous sinuses: As permitted by contrast timing, patent. Review of the MIP images confirms the above findings IMPRESSION: 1. Focus of active extravasation in the extra-axial inferior right frontal hemorrhage. 2. No large vessel occlusion or proximal hemodynamically significant stenosis. Findings discussed with Dr. Derry Lory via telephone at 7:06 p.m. Electronically Signed   By: Feliberto Harts M.D.   On: 02/15/2023 19:09   CT HEAD CODE STROKE WO CONTRAST Result Date: 02/15/2023 CLINICAL DATA:  Code stroke. Neuro deficit, acute, stroke suspected. Patient reportedly fell and hit the back of his head. Trauma. EXAM: CONFUSION.: EXAM: CONFUSION. CT HEAD WITHOUT CONTRAST TECHNIQUE: Contiguous axial images were obtained from the base of the skull through the vertex without intravenous contrast. RADIATION DOSE REDUCTION: This exam was performed according to the departmental dose-optimization program which includes automated exposure control, adjustment of the mA and/or kV according to patient size and/or use of iterative reconstruction technique. COMPARISON:  CT head December 20, 2022. FINDINGS: Motion limited study.  Within this limitation: Brain: Acute likely traumatic extra-axial  hemorrhage along the right inferior frontal convexity along the anterior middle cranial fossa and extending posteriorly along the anterior right temporal lobe and along the right tentorial leaflet. This hemorrhage measures up to 2.5 cm in thickness with associated mass effect and 4 mm of leftward midline shift. Mild hypoattenuation in the inferior left frontal lobe in a region of motion and streak artifact. No evidence of acute large vascular territory infarct. No visible mass lesion. Basal cisterns are patent. No hydrocephalus. Vascular: No hyperdense vessel identified. Skull: Nondisplaced fracture of the occipital bone which extends inferiorly to the occiput. Sinuses/Orbits: Clear sinuses.  No acute orbital findings. ASPECTS [facility] Stroke Program Early CT Score) total score (0-10 with 10 being normal): 10. IMPRESSION: 1. Acute and likely traumatic large (2.5 cm thick) extra-axial hemorrhage along the right inferior frontal convexity / anterior middle cranial fossa, which extends posteriorly along the anterior right temporal convexity and right tentorial leaflet. Approximately 4 mm of leftward midline shift. Recommend neurosurgical consultation. 2. Nondisplaced fracture of the midline occipital bone which extends inferiorly to the occiput. 3. Mild hypoattenuation in the inferior left frontal lobe may be in part artifactual given streak and motion artifact in region, but merits attention on short interval follow-up to exclude developing intraparenchymal contusion. 4. No evidence of acute large vascular territory infarct. Code stroke imaging results were communicated on 02/15/2023 at 6:45 pm to provider Dr. Derry Lory via telephone, who verbally acknowledged these results. Electronically Signed   By: Feliberto Harts M.D.   On: 02/15/2023 18:58    EKG: Independently reviewed. Sinus rhythm.   Assessment/Plan   1. Traumatic intracranial hemorrhage  - Appreciate neurosurgery and trauma surgery consultations,  both are recommending comfort care   - Discussed with both daughters in ED who are in agreement that we focus only on patient's comfort at this point    2. RCC with metastases, PAF, CKD 3A, adrenal insufficiency, COPD  - Transitioning to comfort care     DVT prophylaxis: none, comfort care  Code Status: DNR Level of Care: Level of care: Med-Surg Family Communication: 2 daughters  and grandson at bedside  Disposition Plan:  Patient is from: Home  Anticipated d/c is to: TBD Anticipated d/c date is: 02/18/23  Patient currently: Comfort care being initiated   Consults called: Neurology, neurosurgery, trauma surgery  Admission status: Inpatient     Briscoe Deutscher, MD [facility]  02/15/2023, 10:29 PM

## 2023-02-15 NOTE — Progress Notes (Signed)
Transition of Care [facility]) - CAGE-AID Screening   Patient Details  Name: [name] MRN: [id number] Date of Birth: 07/16/1945  Transition of Care [facility]) CM/SW Contact:    Leota Sauers, RN Phone Number: 02/15/2023, 11:40 PM   Clinical Narrative:  Patient does not use alcohol or illicit substances. Resources not given at this time.   CAGE-AID Screening:    Have You Ever Felt You Ought to Cut Down on Your Drinking or Drug Use?: No Have People Annoyed You By Critizing Your Drinking Or Drug Use?: No Have You Felt Bad Or Guilty About Your Drinking Or Drug Use?: No Have You Ever Had a Drink or Used Drugs First Thing In The Morning to Steady Your Nerves or to Get Rid of a Hangover?: No CAGE-AID Score: 0  Substance Abuse Education Offered: No

## 2023-02-15 NOTE — Progress Notes (Signed)
   02/15/23 1900  Spiritual Encounters  Type of Visit Initial  Care provided to: Pt not available  Referral source Trauma page  Reason for visit Trauma  OnCall Visit No   Chaplain responded to a level one trauma. The patient was attended to by the medical team. Family providing support to the medical team. If a chaplain is requested someone will respond.   Valerie Roys [facility]  [phone]

## 2023-02-15 NOTE — Consult Note (Signed)
CC: unable to obtain  Requesting provider: dr Doran Durand  HPI: [name] is an 77 y.o. male who is here for evaluation initially as code stroke and then changed to level 1 trauma alert and then changed to level 2 alert and then re-upgraded to level 1 due to MS.  Pt is 77 yo with metastatic renal cell ca, CKD, normocytic anemia, PAF no longer on eliquis who had recent admission earlier this month for LE edema and SOB with pleural effusion was found on the ground in the home by family earlier this afternoon with a bump on back of his head. Reportedly oriented to self, withdrawn and dazed and not very communicative. He was brought in as a code stroke for concern for aphasia vs encephalopathy. He was not in a C collar on arrival.  Evaluated by stroke team who recommended trauma alert.   Pt was found to have large head bleed. NSG called as well.   History obtained from chart and hospital staff. Not able to obtain from pt.   Past Medical History:  Diagnosis Date   Arthritis    Chronic kidney disease    only has one kidney    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 02/01/2015   Coronary artery disease    followed by Dr.Tilley   Frequent PVCs    GERD (gastroesophageal reflux disease)    Heart murmur    never has caused any problems   Hx of cancer of lung 1980's   Hyperlipidemia    Hypertension    Hypothyroidism    Incisional hernia 08/01/2015   Lung cancer (HCC) 1993   Lung metastases 2019   Pneumonia    x several   Recurrent umbilical hernia 08/01/2015    Past Surgical History:  Procedure Laterality Date   APPENDECTOMY  age 27   ARTERY BIOPSY Left 04/10/2022   Procedure: LEFT TEMPORAL ARTERY BIOPSY;  Surgeon: Chuck Hint, MD;  Location: [facility] OR;  Service: Vascular;  Laterality: Left;   CHOLECYSTECTOMY  10/18/1978   EYE SURGERY Bilateral 2019   cataract   LAPAROSCOPIC LYSIS OF ADHESIONS N/A 08/01/2015   Procedure: LAPAROSCOPIC LYSIS OF  ADHESIONS;  Surgeon: Karie Soda, MD;  Location: [facility] ORS;  Service: General;  Laterality: N/A;   LUNG LOBECTOMY  02/16/1990   lung cancer- patient has staples in lung not to have MRI per patient    MAXILLARY ANTROSTOMY Left 05/22/2022   Procedure: MAXILLARY ANTROSTOMY;  Surgeon: Laren Boom, DO;  Location: [facility] OR;  Service: ENT;  Laterality: Left;   PILONIDAL CYST EXCISION  02/16/2009   Dr Gerrit Friends   ROBOTIC ASSITED PARTIAL NEPHRECTOMY Right 02/01/2015   Procedure: RIGHT ROBOTIC ASSISTED LAPAROSOCOPY NEPHRECTOMY;  Surgeon: Crist Fat, MD;  Location: [facility] ORS;  Service: Urology;  Laterality: Right;   SINUS ENDO WITH FUSION Left 05/22/2022   Procedure: SINUS ENDO WITH FUSION;  Surgeon: Laren Boom, DO;  Location: [facility] OR;  Service: ENT;  Laterality: Left;   VENTRAL HERNIA REPAIR N/A 08/01/2015   Procedure: LAPAROSCOPIC VENTRAL WALL HERNIA WITH MESH;  Surgeon: Karie Soda, MD;  Location: [facility] ORS;  Service: General;  Laterality: N/A;    Family History  Problem Relation Age of Onset   Heart attack Father     Social:  reports that he quit smoking about 35 years ago. His smoking use included cigarettes. He started smoking about 55 years ago. He has a 20 pack-year smoking history. He has been exposed to  tobacco smoke. He has never used smokeless tobacco. He reports that he does not currently use alcohol. He reports that he does not use drugs.  Allergies:  Allergies  Allergen Reactions   Diphenhydramine Other (See Comments)    Severely restless legs    Medications: unable to review - pt can't provide; reviewed from hosp dc summary from early dec    ROS - unable to obtain  PE Blood pressure (!) 158/90, pulse 80, temperature 98.6 F (37 C), temperature source Axillary, resp. rate 16, SpO2 100%. Constitutional: NAD; elderly male laying on stretcher; no deformities Eyes: Moist conjunctiva; no lid lag; anicteric; PERRL Neck: Trachea midline; no thyromegaly Lungs: Normal  respiratory effort; no tactile fremitus CV: irregular; no palpable thrills;  GI: Abd soft, nt; no palpable hepatosplenomegaly MSK: no obvious deformity; no clubbing/cyanosis Psychiatric: Appropriate affect; alert - can't tell me his name, date, year; speaks but doesn't FC Lymphatic: No palpable cervical or axillary lymphadenopathy Skin:no rash  Results for orders placed or performed during the hospital encounter of 02/15/23 (from the past 48 hours)  CBG monitoring, ED     Status: Abnormal   Collection Time: 02/15/23  6:34 PM  Result Value Ref Range   Glucose-Capillary 135 (H) 70 - 99 mg/dL    Comment: Glucose reference range applies only to samples taken after fasting for at least 8 hours.  Protime-INR     Status: None   Collection Time: 02/15/23  6:35 PM  Result Value Ref Range   Prothrombin Time 14.8 11.4 - 15.2 seconds   INR 1.1 0.8 - 1.2    Comment: (NOTE) INR goal varies based on device and disease states. Performed at [facility], [address], [place], [place] [address]   APTT     Status: None   Collection Time: 02/15/23  6:35 PM  Result Value Ref Range   aPTT 32 24 - 36 seconds    Comment: Performed at [facility], [address] [place], [place] [address]  CBC     Status: Abnormal   Collection Time: 02/15/23  6:35 PM  Result Value Ref Range   WBC 16.5 (H) 4.0 - 10.5 K/uL   RBC 3.66 (L) 4.22 - 5.81 MIL/uL   Hemoglobin 9.8 (L) 13.0 - 17.0 g/dL   HCT 47.8 (L) 29.5 - 62.1 %   MCV 90.4 80.0 - 100.0 fL   MCH 26.8 26.0 - 34.0 pg   MCHC 29.6 (L) 30.0 - 36.0 g/dL   RDW 30.8 (H) 65.7 - 84.6 %   Platelets 364 150 - 400 K/uL   nRBC 0.0 0.0 - 0.2 %    Comment: Performed at [facility], [address], [place], [place] [address]  Differential     Status: Abnormal   Collection Time: 02/15/23  6:35 PM  Result Value Ref Range   Neutrophils Relative % 86 %   Neutro Abs 14.2 (H) 1.7 - 7.7 K/uL   Lymphocytes Relative 7 %   Lymphs Abs 1.1 0.7 - 4.0 K/uL    Monocytes Relative 6 %   Monocytes Absolute 1.0 0.1 - 1.0 K/uL   Eosinophils Relative 0 %   Eosinophils Absolute 0.0 0.0 - 0.5 K/uL   Basophils Relative 0 %   Basophils Absolute 0.0 0.0 - 0.1 K/uL   Immature Granulocytes 1 %   Abs Immature Granulocytes 0.18 (H) 0.00 - 0.07 K/uL    Comment: Performed at [facility], [address]  [place], [place] [address]  Comprehensive metabolic panel     Status: Abnormal   Collection Time: 02/15/23  6:35 PM  Result Value Ref Range   Sodium 142 135 - 145 mmol/L   Potassium 2.9 (L) 3.5 - 5.1 mmol/L   Chloride 94 (L) 98 - 111 mmol/L   CO2 35 (H) 22 - 32 mmol/L   Glucose, Bld 180 (H) 70 - 99 mg/dL    Comment: Glucose reference range applies only to samples taken after fasting for at least 8 hours.   BUN 10 8 - 23 mg/dL   Creatinine, Ser 7.61 0.61 - 1.24 mg/dL   Calcium 7.9 (L) 8.9 - 10.3 mg/dL   Total Protein 6.2 (L) 6.5 - 8.1 g/dL   Albumin 2.2 (L) 3.5 - 5.0 g/dL   AST 27 15 - 41 U/L   ALT 17 0 - 44 U/L   Alkaline Phosphatase 276 (H) 38 - 126 U/L   Total Bilirubin 0.4 0.0 - 1.2 mg/dL   GFR, Estimated >95 >09 mL/min    Comment: (NOTE) Calculated using the CKD-EPI Creatinine Equation (2021)    Anion gap 13 5 - 15    Comment: Performed at [facility], [address] [place], [place] [address]  Ethanol     Status: None   Collection Time: 02/15/23  6:35 PM  Result Value Ref Range   Alcohol, Ethyl (B) <10 <10 mg/dL    Comment: (NOTE) Lowest detectable limit for serum alcohol is 10 mg/dL.  For medical purposes only. Performed at [facility], [address] [place], [place] [address]   I-stat chem 8, ED     Status: Abnormal   Collection Time: 02/15/23  6:42 PM  Result Value Ref Range   Sodium 140 135 - 145 mmol/L   Potassium 3.3 (L) 3.5 - 5.1 mmol/L   Chloride 92 (L) 98 - 111 mmol/L   BUN 12 8 - 23 mg/dL   Creatinine, Ser 9.98 0.61 - 1.24 mg/dL   Glucose, Bld 338 (H) 70 - 99 mg/dL    Comment: Glucose reference  range applies only to samples taken after fasting for at least 8 hours.   Calcium, Ion 0.95 (L) 1.15 - 1.40 mmol/L   TCO2 40 (H) 22 - 32 mmol/L   Hemoglobin 10.5 (L) 13.0 - 17.0 g/dL   HCT 25.0 (L) 53.9 - 76.7 %    CT ANGIO HEAD NECK W WO CM (CODE STROKE) Result Date: 02/15/2023 CLINICAL DATA:  Neuro deficit, acute, stroke suspected EXAM: CT ANGIOGRAPHY HEAD AND NECK WITH AND WITHOUT CONTRAST TECHNIQUE: Multidetector CT imaging of the head and neck was performed using the standard protocol during bolus administration of intravenous contrast. Multiplanar CT image reconstructions and MIPs were obtained to evaluate the vascular anatomy. Carotid stenosis measurements (when applicable) are obtained utilizing NASCET criteria, using the distal internal carotid diameter as the denominator. RADIATION DOSE REDUCTION: This exam was performed according to the departmental dose-optimization program which includes automated exposure control, adjustment of the mA and/or kV according to patient size and/or use of iterative reconstruction technique. CONTRAST:  75mL OMNIPAQUE IOHEXOL 350 MG/ML SOLN COMPARISON:  Same day CT head. FINDINGS: CTA NECK FINDINGS Aortic arch: Great vessel origins are patent.  Atherosclerosis. Right carotid system: Atherosclerosis at the carotid bifurcation without greater than 50% stenosis. Left carotid system: The proximal common carotid artery is obscured by streak artifact. No visible significant (greater than 50%) stenosis. Vertebral arteries: Left dominant. Both vertebral arteries are patent  without hemodynamically significant (greater than 50%) stenosis. Skeleton: Please see separately dictated CT of the cervical spine for evaluation of the cervical spine. Other neck: Please see separately dictated CT of the cervical spine for further evaluation. Upper chest: Visualized lung apices are clear. Small bilateral pleural effusions. Review of the MIP images confirms the above findings CTA HEAD  FINDINGS Anterior circulation: Blush of contrast in the extra-axial inferior right frontal hemorrhage (series 3, image 115), compatible without active extravasation. No evidence of aneurysm or arteriovenous malformation. Bilateral intracranial ICAs, MCAs, and ACAs are patent without proximal hemodynamically significant stenosis. The ACAs are displaced to the left by the hemorrhage. Posterior circulation: Bilateral intradural vertebral arteries, basilar artery, and bilateral posterior cerebral arteries are patent without proximal hemodynamically significant stenosis. No aneurysm identified. Venous sinuses: As permitted by contrast timing, patent. Review of the MIP images confirms the above findings IMPRESSION: 1. Focus of active extravasation in the extra-axial inferior right frontal hemorrhage. 2. No large vessel occlusion or proximal hemodynamically significant stenosis. Findings discussed with Dr. Derry Lory via telephone at 7:06 p.m. Electronically Signed   By: Feliberto Harts M.D.   On: 02/15/2023 19:09   CT HEAD CODE STROKE WO CONTRAST Result Date: 02/15/2023 CLINICAL DATA:  Code stroke. Neuro deficit, acute, stroke suspected. Patient reportedly fell and hit the back of his head. Trauma. EXAM: CONFUSION.: EXAM: CONFUSION. CT HEAD WITHOUT CONTRAST TECHNIQUE: Contiguous axial images were obtained from the base of the skull through the vertex without intravenous contrast. RADIATION DOSE REDUCTION: This exam was performed according to the departmental dose-optimization program which includes automated exposure control, adjustment of the mA and/or kV according to patient size and/or use of iterative reconstruction technique. COMPARISON:  CT head December 20, 2022. FINDINGS: Motion limited study.  Within this limitation: Brain: Acute likely traumatic extra-axial hemorrhage along the right inferior frontal convexity along the anterior middle cranial fossa and extending posteriorly along the anterior right  temporal lobe and along the right tentorial leaflet. This hemorrhage measures up to 2.5 cm in thickness with associated mass effect and 4 mm of leftward midline shift. Mild hypoattenuation in the inferior left frontal lobe in a region of motion and streak artifact. No evidence of acute large vascular territory infarct. No visible mass lesion. Basal cisterns are patent. No hydrocephalus. Vascular: No hyperdense vessel identified. Skull: Nondisplaced fracture of the occipital bone which extends inferiorly to the occiput. Sinuses/Orbits: Clear sinuses.  No acute orbital findings. ASPECTS [facility] Stroke Program Early CT Score) total score (0-10 with 10 being normal): 10. IMPRESSION: 1. Acute and likely traumatic large (2.5 cm thick) extra-axial hemorrhage along the right inferior frontal convexity / anterior middle cranial fossa, which extends posteriorly along the anterior right temporal convexity and right tentorial leaflet. Approximately 4 mm of leftward midline shift. Recommend neurosurgical consultation. 2. Nondisplaced fracture of the midline occipital bone which extends inferiorly to the occiput. 3. Mild hypoattenuation in the inferior left frontal lobe may be in part artifactual given streak and motion artifact in region, but merits attention on short interval follow-up to exclude developing intraparenchymal contusion. 4. No evidence of acute large vascular territory infarct. Code stroke imaging results were communicated on 02/15/2023 at 6:45 pm to provider Dr. Derry Lory via telephone, who verbally acknowledged these results. Electronically Signed   By: Feliberto Harts M.D.   On: 02/15/2023 18:58    Imaging: Personally reviewed  A/P: [name] is an 77 y.o. male  S/p fall TBI Large right frontal hemorrhage with shift Stage IV Renal  cell carcinoma CKD Chronic anemia  Agree with NSG - who recommends no aggressive surgical intervention, DNR/DNI; comfort care NSG has d/w daughter D/w  EDP  Reviewed hospital dc summary 01/2023; labs for past month, CT head/spine; card clinic note, NSG consult note, neuro consult note; d/w EDP  Will sign off.  Mary Sella. Andrey Campanile, MD, FACS General, Bariatric, & Minimally Invasive Surgery [facility]

## 2023-02-15 NOTE — ED Notes (Signed)
Delayed entry due to direct patient care. Patient initially presented as a code stroke. Patient arrived from home. LKW 1600 today. Per EMS, patient was found on the ground inside by his daughters with suspected headstrike. Per EMS, patient's daughters were concerned that the patient wasn't acting right and was slow to respond and not following commands and confused. After obtaining head CT, the code stroke was cancelled and the patient was activated as a level one trauma alert.

## 2023-02-16 ENCOUNTER — Other Ambulatory Visit: Payer: Self-pay

## 2023-02-16 ENCOUNTER — Other Ambulatory Visit (HOSPITAL_COMMUNITY): Payer: Self-pay

## 2023-02-16 DIAGNOSIS — Z515 Encounter for palliative care: Secondary | ICD-10-CM

## 2023-02-16 DIAGNOSIS — I629 Nontraumatic intracranial hemorrhage, unspecified: Secondary | ICD-10-CM | POA: Diagnosis not present

## 2023-02-16 DIAGNOSIS — N184 Chronic kidney disease, stage 4 (severe): Secondary | ICD-10-CM | POA: Diagnosis not present

## 2023-02-16 DIAGNOSIS — C641 Malignant neoplasm of right kidney, except renal pelvis: Secondary | ICD-10-CM | POA: Diagnosis not present

## 2023-02-16 MED ORDER — IRON SUCROSE 20 MG/ML IV SOLN: 200.0000 mg | Freq: Once | INTRAVENOUS | Status: DC

## 2023-02-16 MED ORDER — MORPHINE SULFATE (PF) 2 MG/ML IV SOLN: 1.0000 mg | INTRAVENOUS | Status: DC | PRN

## 2023-02-16 MED ORDER — ACETAMINOPHEN 325 MG PO TABS
650.0000 mg | ORAL_TABLET | Freq: Once | ORAL | Status: DC
Start: 2023-02-16 — End: 2023-05-12

## 2023-02-16 MED ORDER — DIPHENHYDRAMINE HCL 25 MG PO CAPS: 25.0000 mg | ORAL_CAPSULE | Freq: Once | ORAL | Status: DC

## 2023-02-16 NOTE — ED Notes (Signed)
 This RN reached out to attending MD Debarah Crape, related to VS frequency orders, ordered to obtain VS q 24 hr and place verbal order in chart.

## 2023-02-16 NOTE — ED Notes (Signed)
Pt family provided mouth moisturizer and mouth swabs. Family said they would assist with oral care.

## 2023-02-16 NOTE — ED Notes (Signed)
 Pt family at bedside and updated on POC

## 2023-02-16 NOTE — Progress Notes (Signed)
 PROGRESS NOTE    [name]  FMW:[id number] DOB: 07/01/1945 DOA: 02/15/2023 PCP: Loring Tanda Mae, MD  Chief Complaint  Patient presents with   Fall   Altered Mental Status    Hospital Course:  [name] is 77 y.o. male with renal cell carcinoma with metastasis, atrial fibrillation no longer anticoagulated, hypertension, hypothyroidism, CAD, who presents with confusion and lethargy after being found down at home.  Initially patient presented as a code stroke but was quickly escalated to a level 1 trauma given degree of hemorrhage.  On arrival to the ED patient is oriented to self only and not following instructions.  Head CT concerning for large traumatic intracerebral hemorrhage with left shift.  Neurosurgery and trauma surgery were consulted from the ED and recommended comfort care.  Patient's daughters and grandsons agree with plan to proceed with comfort measures only.  Subjective: On evaluation patient's daughters are at bedside.  Patient is resting easily in bed.  He does not meaningfully interact or respond to questions.  Objective: Vitals:   02/16/23 0015 02/16/23 0325 02/16/23 0505 02/16/23 0730  BP: (!) 147/74 (!) 151/75 (!) 149/72   Pulse: 91 96 98   Resp: 16 17 19    Temp:    99.6 F (37.6 C)  TempSrc:    Axillary  SpO2: 98% 94% 95%    No intake or output data in the 24 hours ending 02/16/23 0746 There were no vitals filed for this visit.  Examination: General exam: Appears calm and comfortable, NAD  Respiratory system: No work of breathing, symmetric chest wall expansion Cardiovascular system: S1 & S2 heard, RRR.  Gastrointestinal system: Abdomen is nondistended, soft and nontender.  Neuro: Sleeping.   Extremities: Symmetric, expected ROM Skin: No rashes, lesions  Assessment & Plan:  Principal Problem:   Intracranial hemorrhage (HCC) Active Problems:   Acute respiratory failure with hypoxia (HCC)   CKD (chronic kidney disease), stage IV (HCC)   Kidney  cancer, primary, with metastasis from kidney to other site [facility])   Paroxysmal atrial fibrillation (HCC)   Primary adrenocortical insufficiency (HCC)   Chronic obstructive pulmonary disease, unspecified COPD type (HCC)  Traumatic intracranial hemorrhage - Status post fall at home - Neurosurgery and trauma surgery consulted from ED, recommending comfort care at this time - Family discussions had while patient still in ED and agreed on comfort measures only moving forward  Comfort measures only - Proceed with qshift only vitals -- Pleasure feeds PRN - Palliative care consult to assist in symptom management - Consider transfer to hospice facility  Renal cell carcinoma with metastasis Paroxysmal atrial fibrillation CKD stage IIIa Adrenal insufficiency COPD - Discontinue all maintenance medications in favor of comfort measures only as above      DVT prophylaxis: n/a   Code Status: Do not attempt resuscitation (DNR) - Comfort care Family Communication: Daughters at bedside. Questions and concerns addressed Disposition:  Status is: Inpatient, pending hospice planning.    Consultants:    Treatment Team:  Consulting Physician: Joshua Alm Hamilton, MD Consulting Physician: Md, Trauma, MD  Procedures:    Antimicrobials:  Anti-infectives (From admission, onward)    None       Data Reviewed: I have personally reviewed following labs and imaging studies CBC: Recent Labs  Lab 02/15/23 1835 02/15/23 1842  WBC 16.5*  --   NEUTROABS 14.2*  --   HGB 9.8* 10.5*  HCT 33.1* 31.0*  MCV 90.4  --   PLT 364  --    Basic Metabolic  Panel: Recent Labs  Lab 02/15/23 1835 02/15/23 1842  NA 142 140  K 2.9* 3.3*  CL 94* 92*  CO2 35*  --   GLUCOSE 180* 180*  BUN 10 12  CREATININE 1.09 1.10  CALCIUM  7.9*  --    GFR: Estimated Creatinine Clearance: 52.7 mL/min (by C-G formula based on SCr of 1.1 mg/dL). Liver Function Tests: Recent Labs  Lab 02/15/23 1835  AST 27  ALT 17   ALKPHOS 276*  BILITOT 0.4  PROT 6.2*  ALBUMIN 2.2*   CBG: Recent Labs  Lab 02/15/23 1834  GLUCAP 135*    No results found for this or any previous visit (from the past 240 hours).   Radiology Studies: CT C-SPINE NO CHARGE Result Date: 02/15/2023 CLINICAL DATA:  Neurologic deficit, stroke suspected EXAM: CT CERVICAL SPINE WITHOUT CONTRAST TECHNIQUE: Multidetector CT imaging of the cervical spine was performed without intravenous contrast. Multiplanar CT image reconstructions were also generated. RADIATION DOSE REDUCTION: This exam was performed according to the departmental dose-optimization program which includes automated exposure control, adjustment of the mA and/or kV according to patient size and/or use of iterative reconstruction technique. COMPARISON:  12/20/2022 FINDINGS: Alignment: Alignment is grossly anatomic. Skull base and vertebrae: Nondisplaced longitudinal fracture through the midline of the occipital bone. No acute cervical spine fractures. No destructive bony abnormalities. Soft tissues and spinal canal: No prevertebral fluid or swelling. No visible canal hematoma. Please refer to separately reported CT angiography exam for description of vascular findings. Disc levels: Prominent facet hypertrophy at C2-3, C3-4, and C5-6. There is mild spondylosis at C5-6 and C6-7. Upper chest: Airway is patent.  Trace right pleural effusion. Other: Reconstructed images demonstrate no additional findings. IMPRESSION: 1. No acute cervical spine fracture. 2. Nondisplaced longitudinal fracture through the midline of the occipital bone. 3. Please refer to separately reported CT angiography exam for description of vascular findings. Electronically Signed   By: Ozell Daring M.D.   On: 02/15/2023 20:38   CT ANGIO HEAD NECK W WO CM (CODE STROKE) Result Date: 02/15/2023 CLINICAL DATA:  Neuro deficit, acute, stroke suspected EXAM: CT ANGIOGRAPHY HEAD AND NECK WITH AND WITHOUT CONTRAST TECHNIQUE:  Multidetector CT imaging of the head and neck was performed using the standard protocol during bolus administration of intravenous contrast. Multiplanar CT image reconstructions and MIPs were obtained to evaluate the vascular anatomy. Carotid stenosis measurements (when applicable) are obtained utilizing NASCET criteria, using the distal internal carotid diameter as the denominator. RADIATION DOSE REDUCTION: This exam was performed according to the departmental dose-optimization program which includes automated exposure control, adjustment of the mA and/or kV according to patient size and/or use of iterative reconstruction technique. CONTRAST:  75mL OMNIPAQUE  IOHEXOL  350 MG/ML SOLN COMPARISON:  Same day CT head. FINDINGS: CTA NECK FINDINGS Aortic arch: Great vessel origins are patent.  Atherosclerosis. Right carotid system: Atherosclerosis at the carotid bifurcation without greater than 50% stenosis. Left carotid system: The proximal common carotid artery is obscured by streak artifact. No visible significant (greater than 50%) stenosis. Vertebral arteries: Left dominant. Both vertebral arteries are patent without hemodynamically significant (greater than 50%) stenosis. Skeleton: Please see separately dictated CT of the cervical spine for evaluation of the cervical spine. Other neck: Please see separately dictated CT of the cervical spine for further evaluation. Upper chest: Visualized lung apices are clear. Small bilateral pleural effusions. Review of the MIP images confirms the above findings CTA HEAD FINDINGS Anterior circulation: Blush of contrast in the extra-axial inferior right frontal hemorrhage (series 3,  image 115), compatible without active extravasation. No evidence of aneurysm or arteriovenous malformation. Bilateral intracranial ICAs, MCAs, and ACAs are patent without proximal hemodynamically significant stenosis. The ACAs are displaced to the left by the hemorrhage. Posterior circulation: Bilateral  intradural vertebral arteries, basilar artery, and bilateral posterior cerebral arteries are patent without proximal hemodynamically significant stenosis. No aneurysm identified. Venous sinuses: As permitted by contrast timing, patent. Review of the MIP images confirms the above findings IMPRESSION: 1. Focus of active extravasation in the extra-axial inferior right frontal hemorrhage. 2. No large vessel occlusion or proximal hemodynamically significant stenosis. Findings discussed with Dr. Khaliqdina via telephone at 7:06 p.m. Electronically Signed   By: Gilmore GORMAN Molt M.D.   On: 02/15/2023 19:09   CT HEAD CODE STROKE WO CONTRAST Result Date: 02/15/2023 CLINICAL DATA:  Code stroke. Neuro deficit, acute, stroke suspected. Patient reportedly fell and hit the back of his head. Trauma. EXAM: CONFUSION.: EXAM: CONFUSION. CT HEAD WITHOUT CONTRAST TECHNIQUE: Contiguous axial images were obtained from the base of the skull through the vertex without intravenous contrast. RADIATION DOSE REDUCTION: This exam was performed according to the departmental dose-optimization program which includes automated exposure control, adjustment of the mA and/or kV according to patient size and/or use of iterative reconstruction technique. COMPARISON:  CT head December 20, 2022. FINDINGS: Motion limited study.  Within this limitation: Brain: Acute likely traumatic extra-axial hemorrhage along the right inferior frontal convexity along the anterior middle cranial fossa and extending posteriorly along the anterior right temporal lobe and along the right tentorial leaflet. This hemorrhage measures up to 2.5 cm in thickness with associated mass effect and 4 mm of leftward midline shift. Mild hypoattenuation in the inferior left frontal lobe in a region of motion and streak artifact. No evidence of acute large vascular territory infarct. No visible mass lesion. Basal cisterns are patent. No hydrocephalus. Vascular: No hyperdense vessel  identified. Skull: Nondisplaced fracture of the occipital bone which extends inferiorly to the occiput. Sinuses/Orbits: Clear sinuses.  No acute orbital findings. ASPECTS [facility] Stroke Program Early CT Score) total score (0-10 with 10 being normal): 10. IMPRESSION: 1. Acute and likely traumatic large (2.5 cm thick) extra-axial hemorrhage along the right inferior frontal convexity / anterior middle cranial fossa, which extends posteriorly along the anterior right temporal convexity and right tentorial leaflet. Approximately 4 mm of leftward midline shift. Recommend neurosurgical consultation. 2. Nondisplaced fracture of the midline occipital bone which extends inferiorly to the occiput. 3. Mild hypoattenuation in the inferior left frontal lobe may be in part artifactual given streak and motion artifact in region, but merits attention on short interval follow-up to exclude developing intraparenchymal contusion. 4. No evidence of acute large vascular territory infarct. Code stroke imaging results were communicated on 02/15/2023 at 6:45 pm to provider Dr. Vanessa via telephone, who verbally acknowledged these results. Electronically Signed   By: Gilmore GORMAN Molt M.D.   On: 02/15/2023 18:58    Scheduled Meds:  sodium chloride  flush  3 mL Intravenous Once   Continuous Infusions:   LOS: 1 day    Time spent:  55min  Britney Captain, DO [facility]  To contact the attending physician between 7A-7P please use Epic Chat. To contact the covering physician during after hours 7P-7A, please review Amion.   02/16/2023, 7:46 AM   *This document has been created with the assistance of dictation software. Please excuse typographical errors. *

## 2023-02-16 NOTE — Consult Note (Signed)
 Consultation Note Date: 02/16/2023   Patient Name: [name]  DOB: [birth year]  MRN: [id number]  Age / Sex: 77 y.o., male  PCP: Alex Tanda Mae, MD Referring Physician: Leesa Kast, DO  Reason for Consultation: Goals of Care  HPI/Patient Profile: 77 y.o. male   admitted on 02/15/2023 with PMH of remote lung cancer,  renal cell carcinoma with metastasis, atrial fibrillation no longer anticoagulated, hypertension, hypothyroidism, CAD, who presents with confusion and lethargy after being found down at home.    Initially patient presented as a code stroke but was quickly escalated to a level 1 trauma given degree of hemorrhage.    On arrival to the ED patient is oriented to self only and not following instructions.  Head CT concerning for large traumatic intracerebral hemorrhage with left shift.    Neurosurgery and trauma surgery were consulted from the ED and recommended comfort care.    Patient's daughters and grandsons agree with plan to proceed with comfort measures only.   Clinical Assessment and Goals of Care:    This NP [name] reviewed medical records, received report from team, assessed the patient and then meet at the patient's bedside  to discuss diagnosis, prognosis, GOC, EOL wishes disposition and options.   Concept of Palliative Care was introduced as specialized medical care for people and their families living with serious illness.  If focuses on providing relief from the symptoms and stress of a serious illness.  The goal is to improve quality of life for both the patient and the family.  Created space and opportunity for family to explore thoughts and feelings regarding current medical situation.  They understand the limited prognosis.  They speak to patient's continued physical decline over the past several months.    Education offered difference between a aggressive  medical intervention path  and a palliative comfort care path for this patient at this time was had.    Natural trajectory and expectations at EOL were discussed.  Discussed prognosis of likely days to a week.  Education offered on hospice benefit.    Daughter share their love and appreciation for [name], he worked as a curator, loved to fish  and hunt.  They remain and support each other at the bedside.  Questions and concerns addressed.  Patient  encouraged to call with questions or concerns.     PMT will continue to support holistically.     No documented HPOA or ACP documents   Both daughters act in unison for best interest of their father.    SUMMARY OF RECOMMENDATIONS    Code Status/Advance Care Planning: DNR/DNI Comfort and dignity are the focus of care, allowing for a natural death   Symptom Management:  Morphine  IV 1 mg every 1 prn for pain/dyspnea  Palliative Prophylaxis:  Pain assessment, mouth care  Additional Recommendations (Limitations, Scope, Preferences): Full comfort-allow for a natural death   Psycho-social/Spiritual:  Desire for further Chaplaincy support:no- family supported by community pastor support Additional Recommendations: Hospice education   Prognosis:  Days  to weeks  Discharge Planning: Pending, family to make decision regarding home with hospice vs residential.  Allow time for outcomes      Primary Diagnoses: Present on Admission:  CKD (chronic kidney disease), stage IV (HCC)  Primary adrenocortical insufficiency (HCC)  Chronic obstructive pulmonary disease, unspecified COPD type (HCC)  Paroxysmal atrial fibrillation (HCC)  Kidney cancer, primary, with metastasis from kidney to other site [facility])  Acute respiratory failure with hypoxia (HCC)   I have reviewed the medical record, interviewed the patient and family, and examined the patient. The following aspects are pertinent.  Past Medical History:  Diagnosis Date   Arthritis     Chronic kidney disease    only has one kidney    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 02/01/2015   Coronary artery disease    followed by Dr.Tilley   Frequent PVCs    GERD (gastroesophageal reflux disease)    Heart murmur    never has caused any problems   Hx of cancer of lung 1980's   Hyperlipidemia    Hypertension    Hypothyroidism    Incisional hernia 08/01/2015   Lung cancer (HCC) 1993   Lung metastases 2019   Pneumonia    x several   Recurrent umbilical hernia 08/01/2015   Social History   Socioeconomic History   Marital status: Widowed    Spouse name: Not on file   Number of children: 2   Years of education: Not on file   Highest education level: Not on file  Occupational History   Occupation: Retired  Tobacco Use   Smoking status: Former    Current packs/day: 0.00    Average packs/day: 1 pack/day for 20.0 years (20.0 ttl pk-yrs)    Types: Cigarettes    Start date: 01/29/1968    Quit date: 01/29/1988    Years since quitting: 35.0    Passive exposure: Past   Smokeless tobacco: Never  Vaping Use   Vaping status: Never Used  Substance and Sexual Activity   Alcohol  use: Not Currently    Comment: rare   Drug use: No   Sexual activity: Not Currently  Other Topics Concern   Not on file  Social History Narrative   Not on file   Social Drivers of Health   Financial Resource Strain: Not on file  Food Insecurity: No Food Insecurity (01/18/2023)   Hunger Vital Sign    Worried About Running Out of Food in the Last Year: Never true    Ran Out of Food in the Last Year: Never true  Transportation Needs: No Transportation Needs (01/18/2023)   PRAPARE - Administrator, Civil Service (Medical): No    Lack of Transportation (Non-Medical): No  Physical Activity: Not on file  Stress: Not on file  Social Connections: Unknown (06/30/2021)   [facility] from [facility]   Social Network    Social Network: Not on  file   Family History  Problem Relation Age of Onset   Heart attack Father    Scheduled Meds:  sodium chloride  flush  3 mL Intravenous Once   Continuous Infusions: PRN Meds:.acetaminophen  **OR** acetaminophen , antiseptic oral rinse, atropine , haloperidol  **OR** haloperidol  **OR** haloperidol  lactate, LORazepam  **OR** LORazepam  **OR** LORazepam , morphine  injection, morphine  CONCENTRATE **OR** morphine  CONCENTRATE, ondansetron  **OR** ondansetron  (ZOFRAN ) IV, polyvinyl alcohol  Medications Prior to Admission:  Prior to Admission medications   Medication Sig Start Date End Date Taking? Authorizing Provider  acetaminophen  (TYLENOL ) 500 MG tablet Take  1,000 mg by mouth as needed for mild pain (pain score 1-3) or moderate pain (pain score 4-6). Patient not taking: Reported on 02/15/2023    [provider]  ADVIL  200 MG CAPS Take 400 mg by mouth every 6 (six) hours as needed (for mild pain). Patient not taking: Reported on 02/15/2023    [provider]  Cholecalciferol (VITAMIN D3 PO) Take 1 capsule by mouth at bedtime.    [provider]  diazepam  (VALIUM ) 2 MG tablet TAKE 1-2 TABLETS BY MOUTH EVERY 8 HOURS AS NEEDED FOR ANXIETY Patient taking differently: Take 2-4 mg by mouth every 8 (eight) hours as needed for anxiety. 01/09/23   Pickenpack-Cousar, Athena N, NP  diltiazem  (CARDIZEM  CD) 240 MG 24 hr capsule Take 1 capsule (240 mg total) by mouth in the morning. 03/26/22   Croitoru, Mihai, MD  ezetimibe  (ZETIA ) 10 MG tablet Take 1 tablet (10 mg total) by mouth daily. 03/27/22     famotidine  (PEPCID ) 20 MG tablet Take 20 mg by mouth daily in the afternoon. 09/15/22   [provider]  famotidine  (PEPCID ) 20 MG tablet Take 1 tablet (20 mg total) by mouth every 24 hours as needed for stomach acid. 09/30/22     hydrocortisone  (CORTEF ) 5 MG tablet Take 1 tablet (5 mg total) by mouth See admin instructions. Daily at 3pm. Patient taking differently: Take 5-20 mg by mouth See  admin instructions. Take 15 mg by mouth in the morning and 5 mg at 3 PM daily 12/22/22   Lenon Marien CROME, MD  hydrocortisone  (CORTEF ) 5 MG tablet Take 3 tablets (15 mg total) by mouth every morning AND 1 tablet (5 mg total) every evening. 08/03/22     levothyroxine  (SYNTHROID ) 150 MCG tablet Take 1 tablet (150 mcg total) by mouth daily before breakfast. 01/23/23   Patsy Lenis, MD  levothyroxine  (SYNTHROID ) 150 MCG tablet Take 1 tablet (150 mcg total) by mouth every other day. 07/21/22     levothyroxine  (SYNTHROID ) 150 MCG tablet Take 1 tablet (150 mcg total) by mouth daily before breakfast. 08/03/22   Trixie File, MD  levothyroxine  (SYNTHROID ) 150 MCG tablet Take 1 tablet (150 mcg total) by mouth daily before breakfast. 01/23/23   Patsy Lenis, MD  loratadine  (CLARITIN ) 10 MG tablet Take 10 mg by mouth daily.    [provider]  megestrol  (MEGACE ) 20 MG tablet TAKE 1 TABLET BY MOUTH DAILY 12/26/22   Heilingoetter, Cassandra L, PA-C  megestrol  (MEGACE ) 20 MG tablet Take 1 tablet (20 mg total) by mouth daily. 12/26/22   Heilingoetter, Cassandra L, PA-C  metoprolol  succinate (TOPROL -XL) 50 MG 24 hr tablet Take 1 tablet (50 mg total) by mouth daily. Take with or immediately following a meal. 01/24/23   Patsy Lenis, MD  metoprolol  succinate (TOPROL -XL) 50 MG 24 hr tablet Take 1 tablet (50 mg total) by mouth daily with a meal. 01/23/23   Patsy Lenis, MD  Multiple Vitamins-Minerals (CENTRUM SILVER 50+MEN) TABS Take 1 tablet by mouth at bedtime.    [provider]  mupirocin  ointment (BACTROBAN ) 2 % Place 1 Application into the nose daily as needed (sores).    [provider]  mupirocin  ointment (BACTROBAN ) 2 % Apply a pea size amount with finger to each nostril twice daily for 21 days. 06/15/22     potassium chloride  20 MEQ/15ML (10%) SOLN Take 15 mLs (20 mEq total) by mouth 2 (two) times daily. 02/12/23   Sherrod Sherrod, MD  Potassium Chloride  ER 20 MEQ TBCR  Take 1  tablet (20 mEq total) by mouth 2 (two) times daily. 02/13/23   Sherrod Sherrod, MD  torsemide  (DEMADEX ) 20 MG tablet Take 1/2 tablet (10 mg) by mouth daily. IF swelling does not go down, then you can take 1 tablet (20 mg) daily. 02/09/23   Jerilynn Lamarr HERO, NP  traZODone  (DESYREL ) 150 MG tablet Take 1 tablet (150 mg total) by mouth every evening. 08/05/22     WELIREG  40 MG tablet Take 120 mg by mouth daily.    [provider]   Allergies  Allergen Reactions   Diphenhydramine  Other (See Comments)    Severely restless legs   Review of Systems  Unable to perform ROS: Patient unresponsive    Physical Exam Constitutional:      Appearance: He is cachectic. He is ill-appearing.     Interventions: Nasal cannula in place.  Cardiovascular:     Rate and Rhythm: Normal rate.  Pulmonary:     Effort: Pulmonary effort is normal.  Musculoskeletal:     Comments: Generalized muscle atrophy  Skin:    General: Skin is warm and dry.     Vital Signs: BP (!) 149/72   Pulse 98   Temp 99.6 F (37.6 C) (Axillary)   Resp 19   SpO2 95%  Pain Scale: PAINAD       SpO2: SpO2: 95 % O2 Device:SpO2: 95 % O2 Flow Rate: .O2 Flow Rate (L/min): 3 L/min  IO: Intake/output summary: No intake or output data in the 24 hours ending 02/16/23 1205  LBM:   Baseline Weight:   Most recent weight:       Palliative Assessment/Data:     Time  75 minutes  Signed by: Alex Plants, NP   Please contact Palliative Medicine Team phone at [phone] for questions and concerns.  For individual provider: See Tracey

## 2023-02-16 NOTE — ED Notes (Signed)
 ED TO INPATIENT HANDOFF REPORT  ED Nurse Name and Phone #: Omar NOVAK Paramedic [phone]  S Name/Age/Gender [name] 77 y.o. male Room/Bed: 030C/030C  Code Status   Code Status: Do not attempt resuscitation (DNR) - Comfort care  Home/SNF/Other Hospice or home with hospice Patient oriented to: AO x 0 Is this baseline? No   Triage Complete: Triage complete  Chief Complaint Intracranial hemorrhage (HCC) [I62.9]  Triage Note No notes on file   Allergies Allergies  Allergen Reactions   Diphenhydramine  Other (See Comments)    Severely restless legs    Level of Care/Admitting Diagnosis ED Disposition     ED Disposition  Admit   Condition  --   Comment  Hospital Area: [facility] [[id number]]  Level of Care: Med-Surg [16]  May admit patient to [facility] or [facility] if equivalent level of care is available:: No  Covid Evaluation: Asymptomatic - no recent exposure (last 10 days) testing not required  Diagnosis: Intracranial hemorrhage [facility]) [[id number]]  Admitting Physician: CHARLTON EVALENE RAMAN [[id number]]  Attending Physician: CHARLTON EVALENE RAMAN [[id number]]  Certification:: I certify this patient will need inpatient services for at least 2 midnights  Expected Medical Readiness: 2023-03-14          B Medical/Surgery History Past Medical History:  Diagnosis Date   Arthritis    Chronic kidney disease    only has one kidney    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 02/01/2015   Coronary artery disease    followed by Dr.Tilley   Frequent PVCs    GERD (gastroesophageal reflux disease)    Heart murmur    never has caused any problems   Hx of cancer of lung 1980's   Hyperlipidemia    Hypertension    Hypothyroidism    Incisional hernia 08/01/2015   Lung cancer (HCC) 1993   Lung metastases 2019   Pneumonia    x several   Recurrent umbilical hernia 08/01/2015   Past Surgical History:  Procedure Laterality Date   APPENDECTOMY  age  1   ARTERY BIOPSY Left 04/10/2022   Procedure: LEFT TEMPORAL ARTERY BIOPSY;  Surgeon: Eliza Lonni RAMAN, MD;  Location: [facility] OR;  Service: Vascular;  Laterality: Left;   CHOLECYSTECTOMY  10/18/1978   EYE SURGERY Bilateral 2019   cataract   LAPAROSCOPIC LYSIS OF ADHESIONS N/A 08/01/2015   Procedure: LAPAROSCOPIC LYSIS OF ADHESIONS;  Surgeon: Elspeth Schultze, MD;  Location: [facility] ORS;  Service: General;  Laterality: N/A;   LUNG LOBECTOMY  02/16/1990   lung cancer- patient has staples in lung not to have MRI per patient    MAXILLARY ANTROSTOMY Left 05/22/2022   Procedure: MAXILLARY ANTROSTOMY;  Surgeon: Llewellyn Gerard LABOR, DO;  Location: [facility] OR;  Service: ENT;  Laterality: Left;   PILONIDAL CYST EXCISION  02/16/2009   Dr Eletha   ROBOTIC ASSITED PARTIAL NEPHRECTOMY Right 02/01/2015   Procedure: RIGHT ROBOTIC ASSISTED LAPAROSOCOPY NEPHRECTOMY;  Surgeon: Morene LELON Salines, MD;  Location: [facility] ORS;  Service: Urology;  Laterality: Right;   SINUS ENDO WITH FUSION Left 05/22/2022   Procedure: SINUS ENDO WITH FUSION;  Surgeon: Llewellyn Gerard LABOR, DO;  Location: [facility] OR;  Service: ENT;  Laterality: Left;   VENTRAL HERNIA REPAIR N/A 08/01/2015   Procedure: LAPAROSCOPIC VENTRAL WALL HERNIA WITH MESH;  Surgeon: Elspeth Schultze, MD;  Location: [facility] ORS;  Service: General;  Laterality: N/A;     A IV Location/Drains/Wounds Patient Lines/Drains/Airways Status     Active Line/Drains/Airways  Name Placement date Placement time Site Days   Peripheral IV 02/15/23 18 G Anterior;Left Antecubital 02/15/23  --  Antecubital  1   Incision - 1 Port  --  --  -- --            Intake/Output Last 24 hours No intake or output data in the 24 hours ending 02/16/23 1147  Labs/Imaging Results for orders placed or performed during the hospital encounter of 02/15/23 (from the past 48 hours)  CBG monitoring, ED     Status: Abnormal   Collection Time: 02/15/23  6:34 PM  Result Value Ref Range   Glucose-Capillary 135 (H) 70  - 99 mg/dL    Comment: Glucose reference range applies only to samples taken after fasting for at least 8 hours.  Protime-INR     Status: None   Collection Time: 02/15/23  6:35 PM  Result Value Ref Range   Prothrombin Time 14.8 11.4 - 15.2 seconds   INR 1.1 0.8 - 1.2    Comment: (NOTE) INR goal varies based on device and disease states. Performed at [facility], [address], [place], [place] [address]   APTT     Status: None   Collection Time: 02/15/23  6:35 PM  Result Value Ref Range   aPTT 32 24 - 36 seconds    Comment: Performed at [facility], [address], [place], [place] [address]  CBC     Status: Abnormal   Collection Time: 02/15/23  6:35 PM  Result Value Ref Range   WBC 16.5 (H) 4.0 - 10.5 K/uL   RBC 3.66 (L) 4.22 - 5.81 MIL/uL   Hemoglobin 9.8 (L) 13.0 - 17.0 g/dL   HCT 66.8 (L) 60.9 - 47.9 %   MCV 90.4 80.0 - 100.0 fL   MCH 26.8 26.0 - 34.0 pg   MCHC 29.6 (L) 30.0 - 36.0 g/dL   RDW 82.2 (H) 88.4 - 84.4 %   Platelets 364 150 - 400 K/uL   nRBC 0.0 0.0 - 0.2 %    Comment: Performed at [facility], [address] [place], [place] [address]  Differential     Status: Abnormal   Collection Time: 02/15/23  6:35 PM  Result Value Ref Range   Neutrophils Relative % 86 %   Neutro Abs 14.2 (H) 1.7 - 7.7 K/uL   Lymphocytes Relative 7 %   Lymphs Abs 1.1 0.7 - 4.0 K/uL   Monocytes Relative 6 %   Monocytes Absolute 1.0 0.1 - 1.0 K/uL   Eosinophils Relative 0 %   Eosinophils Absolute 0.0 0.0 - 0.5 K/uL   Basophils Relative 0 %   Basophils Absolute 0.0 0.0 - 0.1 K/uL   Immature Granulocytes 1 %   Abs Immature Granulocytes 0.18 (H) 0.00 - 0.07 K/uL    Comment: Performed at [facility], [address], [place], [place] [address]  Comprehensive metabolic panel     Status: Abnormal   Collection Time: 02/15/23  6:35 PM  Result Value Ref Range   Sodium 142 135 - 145 mmol/L   Potassium 2.9 (L) 3.5 - 5.1 mmol/L   Chloride 94 (L) 98 - 111 mmol/L    CO2 35 (H) 22 - 32 mmol/L   Glucose, Bld 180 (H) 70 - 99 mg/dL    Comment: Glucose reference range applies only to samples taken after fasting for at least 8 hours.   BUN 10 8 - 23 mg/dL   Creatinine,  Ser 1.09 0.61 - 1.24 mg/dL   Calcium  7.9 (L) 8.9 - 10.3 mg/dL   Total Protein 6.2 (L) 6.5 - 8.1 g/dL   Albumin 2.2 (L) 3.5 - 5.0 g/dL   AST 27 15 - 41 U/L   ALT 17 0 - 44 U/L   Alkaline Phosphatase 276 (H) 38 - 126 U/L   Total Bilirubin 0.4 0.0 - 1.2 mg/dL   GFR, Estimated >39 >39 mL/min    Comment: (NOTE) Calculated using the CKD-EPI Creatinine Equation (2021)    Anion gap 13 5 - 15    Comment: Performed at [facility], [address] [place], [place] [address]  Ethanol     Status: None   Collection Time: 02/15/23  6:35 PM  Result Value Ref Range   Alcohol , Ethyl (B) <10 <10 mg/dL    Comment: (NOTE) Lowest detectable limit for serum alcohol  is 10 mg/dL.  For medical purposes only. Performed at [facility], [address] [place], [place] [address]   I-stat chem 8, ED     Status: Abnormal   Collection Time: 02/15/23  6:42 PM  Result Value Ref Range   Sodium 140 135 - 145 mmol/L   Potassium 3.3 (L) 3.5 - 5.1 mmol/L   Chloride 92 (L) 98 - 111 mmol/L   BUN 12 8 - 23 mg/dL   Creatinine, Ser 8.89 0.61 - 1.24 mg/dL   Glucose, Bld 819 (H) 70 - 99 mg/dL    Comment: Glucose reference range applies only to samples taken after fasting for at least 8 hours.   Calcium , Ion 0.95 (L) 1.15 - 1.40 mmol/L   TCO2 40 (H) 22 - 32 mmol/L   Hemoglobin 10.5 (L) 13.0 - 17.0 g/dL   HCT 68.9 (L) 60.9 - 47.9 %   CT C-SPINE NO CHARGE Result Date: 02/15/2023 CLINICAL DATA:  Neurologic deficit, stroke suspected EXAM: CT CERVICAL SPINE WITHOUT CONTRAST TECHNIQUE: Multidetector CT imaging of the cervical spine was performed without intravenous contrast. Multiplanar CT image reconstructions were also generated. RADIATION DOSE REDUCTION: This exam was performed according to the departmental  dose-optimization program which includes automated exposure control, adjustment of the mA and/or kV according to patient size and/or use of iterative reconstruction technique. COMPARISON:  12/20/2022 FINDINGS: Alignment: Alignment is grossly anatomic. Skull base and vertebrae: Nondisplaced longitudinal fracture through the midline of the occipital bone. No acute cervical spine fractures. No destructive bony abnormalities. Soft tissues and spinal canal: No prevertebral fluid or swelling. No visible canal hematoma. Please refer to separately reported CT angiography exam for description of vascular findings. Disc levels: Prominent facet hypertrophy at C2-3, C3-4, and C5-6. There is mild spondylosis at C5-6 and C6-7. Upper chest: Airway is patent.  Trace right pleural effusion. Other: Reconstructed images demonstrate no additional findings. IMPRESSION: 1. No acute cervical spine fracture. 2. Nondisplaced longitudinal fracture through the midline of the occipital bone. 3. Please refer to separately reported CT angiography exam for description of vascular findings. Electronically Signed   By: Ozell Daring M.D.   On: 02/15/2023 20:38   CT ANGIO HEAD NECK W WO CM (CODE STROKE) Result Date: 02/15/2023 CLINICAL DATA:  Neuro deficit, acute, stroke suspected EXAM: CT ANGIOGRAPHY HEAD AND NECK WITH AND WITHOUT CONTRAST TECHNIQUE: Multidetector CT imaging of the head and neck was performed using the standard protocol during bolus administration of intravenous contrast. Multiplanar CT image reconstructions and MIPs were obtained to evaluate the vascular anatomy. Carotid stenosis measurements (when applicable) are obtained utilizing  NASCET criteria, using the distal internal carotid diameter as the denominator. RADIATION DOSE REDUCTION: This exam was performed according to the departmental dose-optimization program which includes automated exposure control, adjustment of the mA and/or kV according to patient size and/or use  of iterative reconstruction technique. CONTRAST:  75mL OMNIPAQUE  IOHEXOL  350 MG/ML SOLN COMPARISON:  Same day CT head. FINDINGS: CTA NECK FINDINGS Aortic arch: Great vessel origins are patent.  Atherosclerosis. Right carotid system: Atherosclerosis at the carotid bifurcation without greater than 50% stenosis. Left carotid system: The proximal common carotid artery is obscured by streak artifact. No visible significant (greater than 50%) stenosis. Vertebral arteries: Left dominant. Both vertebral arteries are patent without hemodynamically significant (greater than 50%) stenosis. Skeleton: Please see separately dictated CT of the cervical spine for evaluation of the cervical spine. Other neck: Please see separately dictated CT of the cervical spine for further evaluation. Upper chest: Visualized lung apices are clear. Small bilateral pleural effusions. Review of the MIP images confirms the above findings CTA HEAD FINDINGS Anterior circulation: Blush of contrast in the extra-axial inferior right frontal hemorrhage (series 3, image 115), compatible without active extravasation. No evidence of aneurysm or arteriovenous malformation. Bilateral intracranial ICAs, MCAs, and ACAs are patent without proximal hemodynamically significant stenosis. The ACAs are displaced to the left by the hemorrhage. Posterior circulation: Bilateral intradural vertebral arteries, basilar artery, and bilateral posterior cerebral arteries are patent without proximal hemodynamically significant stenosis. No aneurysm identified. Venous sinuses: As permitted by contrast timing, patent. Review of the MIP images confirms the above findings IMPRESSION: 1. Focus of active extravasation in the extra-axial inferior right frontal hemorrhage. 2. No large vessel occlusion or proximal hemodynamically significant stenosis. Findings discussed with Dr. Khaliqdina via telephone at 7:06 p.m. Electronically Signed   By: Gilmore GORMAN Molt M.D.   On: 02/15/2023  19:09   CT HEAD CODE STROKE WO CONTRAST Result Date: 02/15/2023 CLINICAL DATA:  Code stroke. Neuro deficit, acute, stroke suspected. Patient reportedly fell and hit the back of his head. Trauma. EXAM: CONFUSION.: EXAM: CONFUSION. CT HEAD WITHOUT CONTRAST TECHNIQUE: Contiguous axial images were obtained from the base of the skull through the vertex without intravenous contrast. RADIATION DOSE REDUCTION: This exam was performed according to the departmental dose-optimization program which includes automated exposure control, adjustment of the mA and/or kV according to patient size and/or use of iterative reconstruction technique. COMPARISON:  CT head December 20, 2022. FINDINGS: Motion limited study.  Within this limitation: Brain: Acute likely traumatic extra-axial hemorrhage along the right inferior frontal convexity along the anterior middle cranial fossa and extending posteriorly along the anterior right temporal lobe and along the right tentorial leaflet. This hemorrhage measures up to 2.5 cm in thickness with associated mass effect and 4 mm of leftward midline shift. Mild hypoattenuation in the inferior left frontal lobe in a region of motion and streak artifact. No evidence of acute large vascular territory infarct. No visible mass lesion. Basal cisterns are patent. No hydrocephalus. Vascular: No hyperdense vessel identified. Skull: Nondisplaced fracture of the occipital bone which extends inferiorly to the occiput. Sinuses/Orbits: Clear sinuses.  No acute orbital findings. ASPECTS [facility] Stroke Program Early CT Score) total score (0-10 with 10 being normal): 10. IMPRESSION: 1. Acute and likely traumatic large (2.5 cm thick) extra-axial hemorrhage along the right inferior frontal convexity / anterior middle cranial fossa, which extends posteriorly along the anterior right temporal convexity and right tentorial leaflet. Approximately 4 mm of leftward midline shift. Recommend neurosurgical consultation. 2.  Nondisplaced fracture of the  midline occipital bone which extends inferiorly to the occiput. 3. Mild hypoattenuation in the inferior left frontal lobe may be in part artifactual given streak and motion artifact in region, but merits attention on short interval follow-up to exclude developing intraparenchymal contusion. 4. No evidence of acute large vascular territory infarct. Code stroke imaging results were communicated on 02/15/2023 at 6:45 pm to provider Dr. Vanessa via telephone, who verbally acknowledged these results. Electronically Signed   By: Gilmore GORMAN Molt M.D.   On: 02/15/2023 18:58    Pending Labs Unresulted Labs (From admission, onward)    None       Vitals/Pain Today's Vitals   02/16/23 0015 02/16/23 0325 02/16/23 0505 02/16/23 0730  BP: (!) 147/74 (!) 151/75 (!) 149/72   Pulse: 91 96 98   Resp: 16 17 19    Temp:    99.6 F (37.6 C)  TempSrc:    Axillary  SpO2: 98% 94% 95%     Isolation Precautions No active isolations  Medications Medications  sodium chloride  flush (NS) 0.9 % injection 3 mL (has no administration in time range)  acetaminophen  (TYLENOL ) tablet 650 mg (has no administration in time range)    Or  acetaminophen  (TYLENOL ) suppository 650 mg (has no administration in time range)  morphine  CONCENTRATE 10 mg / 0.5 ml oral solution 5 mg (has no administration in time range)    Or  morphine  CONCENTRATE 10 mg / 0.5 ml oral solution 5 mg (has no administration in time range)  morphine  (PF) 2 MG/ML injection 1-4 mg (2 mg Intravenous Given 02/16/23 0512)  LORazepam  (ATIVAN ) tablet 1 mg (has no administration in time range)    Or  LORazepam  (ATIVAN ) 2 MG/ML concentrated solution 1 mg (has no administration in time range)    Or  LORazepam  (ATIVAN ) injection 1 mg (has no administration in time range)  haloperidol  (HALDOL ) tablet 0.5 mg (has no administration in time range)    Or  haloperidol  (HALDOL ) 2 MG/ML solution 0.5 mg (has no administration in time  range)    Or  haloperidol  lactate (HALDOL ) injection 0.5 mg (has no administration in time range)  ondansetron  (ZOFRAN -ODT) disintegrating tablet 4 mg (has no administration in time range)    Or  ondansetron  (ZOFRAN ) injection 4 mg (has no administration in time range)  antiseptic oral rinse (BIOTENE) solution 15 mL (has no administration in time range)  polyvinyl alcohol  (LIQUIFILM TEARS) 1.4 % ophthalmic solution 1 drop (has no administration in time range)  atropine  1 % ophthalmic solution 4 drop (has no administration in time range)  iohexol  (OMNIPAQUE ) 350 MG/ML injection 75 mL (75 mLs Intravenous Contrast Given 02/15/23 1858)    Mobility non-ambulatory     Focused Assessments Patient initially here for code stroke, then trauma. Found to have large brain bleed. Comfort care only   R Recommendations: See Admitting Provider Note  Report given to:   Additional Notes:

## 2023-02-16 NOTE — ED Notes (Signed)
Patient placed into hospital bed for comfort.

## 2023-02-17 DEATH — deceased

## 2023-02-18 ENCOUNTER — Inpatient Hospital Stay: Payer: Medicare HMO

## 2023-02-18 ENCOUNTER — Telehealth: Payer: Self-pay | Admitting: Medical Oncology

## 2023-02-18 NOTE — Telephone Encounter (Signed)
 [name] passed away in the hospital on 03-18-23 @ 0100.

## 2023-02-19 ENCOUNTER — Ambulatory Visit: Payer: Medicare HMO

## 2023-02-22 ENCOUNTER — Ambulatory Visit: Payer: Medicare HMO

## 2023-02-24 ENCOUNTER — Other Ambulatory Visit: Payer: Self-pay | Admitting: Nurse Practitioner

## 2023-02-24 ENCOUNTER — Ambulatory Visit: Payer: Medicare HMO

## 2023-02-25 ENCOUNTER — Ambulatory Visit: Payer: Medicare HMO | Admitting: Student

## 2023-02-26 ENCOUNTER — Ambulatory Visit: Payer: Medicare HMO

## 2023-03-03 ENCOUNTER — Other Ambulatory Visit: Payer: Medicare HMO

## 2023-03-03 ENCOUNTER — Ambulatory Visit: Payer: Medicare HMO | Admitting: Internal Medicine

## 2023-03-12 ENCOUNTER — Ambulatory Visit: Payer: Medicare HMO | Admitting: Adult Health

## 2023-03-20 NOTE — Discharge Summary (Signed)
 Physician Discharge Summary   Patient: [name] MRN: [id number] DOB: 1945/03/10  Admit date:     02/15/2023  Discharge date: 03/02/23  Discharge Physician: Lorane Poland   PCP: Loring Tanda Mae, MD     Discharge Diagnoses: Principal Problem:   Intracranial hemorrhage [facility]) Active Problems:   Acute respiratory failure with hypoxia (HCC)   CKD (chronic kidney disease), stage IV (HCC)   Kidney cancer, primary, with metastasis from kidney to other site [facility])   Paroxysmal atrial fibrillation (HCC)   Primary adrenocortical insufficiency (HCC)   Chronic obstructive pulmonary disease, unspecified COPD type (HCC)  Resolved Problems:   * No resolved hospital problems. *  Hospital Course: [name] was a 78 y.o. male with renal cell carcinoma with metastasis, atrial fibrillation no longer anticoagulated, hypertension, hypothyroidism, CAD, who presented with confusion and lethargy after being found down at home.  Initially patient presented as a code stroke but was quickly escalated to a level 1 trauma given degree of hemorrhage seen on head CT.  On arrival to the ED patient was oriented to self only and not following instructions.  Head CT concerning for large traumatic intracerebral hemorrhage with left shift.  Neurosurgery and trauma surgery were consulted from the ED and recommended comfort care.  Patient's daughters and grandsons agree with plan to proceed with comfort measures only.  The patient was admitted to TRH service.  At the time of my evaluation patient was unresponsive to questions, did not participate in exam, did not interact.  Ultimately, the patient was declared deceased at 0100 on March 02, 2023.    DISCHARGE MEDICATION: Allergies as of 03/02/23       Reactions   Diphenhydramine  Other (See Comments)   Severely restless legs        Medication List     ASK your doctor about these medications    acetaminophen  500 MG tablet Commonly known as: TYLENOL  Take 1,000  mg by mouth as needed for mild pain (pain score 1-3) or moderate pain (pain score 4-6).   Advil  200 MG Caps Generic drug: Ibuprofen  Take 400 mg by mouth every 6 (six) hours as needed (for mild pain).   Centrum Silver 50+Men Tabs Take 1 tablet by mouth at bedtime.   famotidine  20 MG tablet Commonly known as: PEPCID  Take 20 mg by mouth daily in the afternoon.   loratadine  10 MG tablet Commonly known as: CLARITIN  Take 10 mg by mouth daily.   mupirocin  ointment 2 % Commonly known as: BACTROBAN  Place 1 Application into the nose daily as needed (sores).   VITAMIN D3 PO Take 1 capsule by mouth at bedtime.   Welireg  40 MG tablet Generic drug: belzutifan  Take 120 mg by mouth daily.        Discharge Exam: Filed Weights   02/16/23 1645  Weight: 70.3 kg    Condition at discharge:  Deceased  The results of significant diagnostics from this hospitalization (including imaging, microbiology, ancillary and laboratory) are listed below for reference.   Imaging Studies: CT C-SPINE NO CHARGE Result Date: 02/15/2023 CLINICAL DATA:  Neurologic deficit, stroke suspected EXAM: CT CERVICAL SPINE WITHOUT CONTRAST TECHNIQUE: Multidetector CT imaging of the cervical spine was performed without intravenous contrast. Multiplanar CT image reconstructions were also generated. RADIATION DOSE REDUCTION: This exam was performed according to the departmental dose-optimization program which includes automated exposure control, adjustment of the mA and/or kV according to patient size and/or use of iterative reconstruction technique. COMPARISON:  12/20/2022 FINDINGS: Alignment: Alignment is grossly anatomic. Skull  base and vertebrae: Nondisplaced longitudinal fracture through the midline of the occipital bone. No acute cervical spine fractures. No destructive bony abnormalities. Soft tissues and spinal canal: No prevertebral fluid or swelling. No visible canal hematoma. Please refer to separately reported CT  angiography exam for description of vascular findings. Disc levels: Prominent facet hypertrophy at C2-3, C3-4, and C5-6. There is mild spondylosis at C5-6 and C6-7. Upper chest: Airway is patent.  Trace right pleural effusion. Other: Reconstructed images demonstrate no additional findings. IMPRESSION: 1. No acute cervical spine fracture. 2. Nondisplaced longitudinal fracture through the midline of the occipital bone. 3. Please refer to separately reported CT angiography exam for description of vascular findings. Electronically Signed   By: Ozell Daring M.D.   On: 02/15/2023 20:38   CT ANGIO HEAD NECK W WO CM (CODE STROKE) Result Date: 02/15/2023 CLINICAL DATA:  Neuro deficit, acute, stroke suspected EXAM: CT ANGIOGRAPHY HEAD AND NECK WITH AND WITHOUT CONTRAST TECHNIQUE: Multidetector CT imaging of the head and neck was performed using the standard protocol during bolus administration of intravenous contrast. Multiplanar CT image reconstructions and MIPs were obtained to evaluate the vascular anatomy. Carotid stenosis measurements (when applicable) are obtained utilizing NASCET criteria, using the distal internal carotid diameter as the denominator. RADIATION DOSE REDUCTION: This exam was performed according to the departmental dose-optimization program which includes automated exposure control, adjustment of the mA and/or kV according to patient size and/or use of iterative reconstruction technique. CONTRAST:  75mL OMNIPAQUE  IOHEXOL  350 MG/ML SOLN COMPARISON:  Same day CT head. FINDINGS: CTA NECK FINDINGS Aortic arch: Great vessel origins are patent.  Atherosclerosis. Right carotid system: Atherosclerosis at the carotid bifurcation without greater than 50% stenosis. Left carotid system: The proximal common carotid artery is obscured by streak artifact. No visible significant (greater than 50%) stenosis. Vertebral arteries: Left dominant. Both vertebral arteries are patent without hemodynamically significant  (greater than 50%) stenosis. Skeleton: Please see separately dictated CT of the cervical spine for evaluation of the cervical spine. Other neck: Please see separately dictated CT of the cervical spine for further evaluation. Upper chest: Visualized lung apices are clear. Small bilateral pleural effusions. Review of the MIP images confirms the above findings CTA HEAD FINDINGS Anterior circulation: Blush of contrast in the extra-axial inferior right frontal hemorrhage (series 3, image 115), compatible without active extravasation. No evidence of aneurysm or arteriovenous malformation. Bilateral intracranial ICAs, MCAs, and ACAs are patent without proximal hemodynamically significant stenosis. The ACAs are displaced to the left by the hemorrhage. Posterior circulation: Bilateral intradural vertebral arteries, basilar artery, and bilateral posterior cerebral arteries are patent without proximal hemodynamically significant stenosis. No aneurysm identified. Venous sinuses: As permitted by contrast timing, patent. Review of the MIP images confirms the above findings IMPRESSION: 1. Focus of active extravasation in the extra-axial inferior right frontal hemorrhage. 2. No large vessel occlusion or proximal hemodynamically significant stenosis. Findings discussed with Dr. Khaliqdina via telephone at 7:06 p.m. Electronically Signed   By: Gilmore GORMAN Molt M.D.   On: 02/15/2023 19:09   CT HEAD CODE STROKE WO CONTRAST Result Date: 02/15/2023 CLINICAL DATA:  Code stroke. Neuro deficit, acute, stroke suspected. Patient reportedly fell and hit the back of his head. Trauma. EXAM: CONFUSION.: EXAM: CONFUSION. CT HEAD WITHOUT CONTRAST TECHNIQUE: Contiguous axial images were obtained from the base of the skull through the vertex without intravenous contrast. RADIATION DOSE REDUCTION: This exam was performed according to the departmental dose-optimization program which includes automated exposure control, adjustment of the mA and/or  kV  according to patient size and/or use of iterative reconstruction technique. COMPARISON:  CT head December 20, 2022. FINDINGS: Motion limited study.  Within this limitation: Brain: Acute likely traumatic extra-axial hemorrhage along the right inferior frontal convexity along the anterior middle cranial fossa and extending posteriorly along the anterior right temporal lobe and along the right tentorial leaflet. This hemorrhage measures up to 2.5 cm in thickness with associated mass effect and 4 mm of leftward midline shift. Mild hypoattenuation in the inferior left frontal lobe in a region of motion and streak artifact. No evidence of acute large vascular territory infarct. No visible mass lesion. Basal cisterns are patent. No hydrocephalus. Vascular: No hyperdense vessel identified. Skull: Nondisplaced fracture of the occipital bone which extends inferiorly to the occiput. Sinuses/Orbits: Clear sinuses.  No acute orbital findings. ASPECTS [facility] Stroke Program Early CT Score) total score (0-10 with 10 being normal): 10. IMPRESSION: 1. Acute and likely traumatic large (2.5 cm thick) extra-axial hemorrhage along the right inferior frontal convexity / anterior middle cranial fossa, which extends posteriorly along the anterior right temporal convexity and right tentorial leaflet. Approximately 4 mm of leftward midline shift. Recommend neurosurgical consultation. 2. Nondisplaced fracture of the midline occipital bone which extends inferiorly to the occiput. 3. Mild hypoattenuation in the inferior left frontal lobe may be in part artifactual given streak and motion artifact in region, but merits attention on short interval follow-up to exclude developing intraparenchymal contusion. 4. No evidence of acute large vascular territory infarct. Code stroke imaging results were communicated on 02/15/2023 at 6:45 pm to provider Dr. Vanessa via telephone, who verbally acknowledged these results. Electronically Signed   By:  Gilmore GORMAN Molt M.D.   On: 02/15/2023 18:58    Microbiology: Results for orders placed or performed during the hospital encounter of 01/18/23  Resp panel by RT-PCR (RSV, Flu A&B, Covid) Anterior Nasal Swab     Status: None   Collection Time: 01/18/23 11:36 AM   Specimen: Anterior Nasal Swab  Result Value Ref Range Status   SARS Coronavirus 2 by RT PCR NEGATIVE NEGATIVE Final    Comment: (NOTE) SARS-CoV-2 target nucleic acids are NOT DETECTED.  The SARS-CoV-2 RNA is generally detectable in upper respiratory specimens during the acute phase of infection. The lowest concentration of SARS-CoV-2 viral copies this assay can detect is 138 copies/mL. A negative result does not preclude SARS-Cov-2 infection and should not be used as the sole basis for treatment or other patient management decisions. A negative result may occur with  improper specimen collection/handling, submission of specimen other than nasopharyngeal swab, presence of viral mutation(s) within the areas targeted by this assay, and inadequate number of viral copies(<138 copies/mL). A negative result must be combined with clinical observations, patient history, and epidemiological information. The expected result is Negative.  Fact Sheet for Patients:  bloggercourse.com  Fact Sheet for Healthcare Providers:  seriousbroker.it  This test is no t yet approved or cleared by the [place]  FDA and  has been authorized for detection and/or diagnosis of SARS-CoV-2 by FDA under an Emergency Use Authorization (EUA). This EUA will remain  in effect (meaning this test can be used) for the duration of the COVID-19 declaration under Section 564(b)(1) of the Act, 21 U.S.C.section 360bbb-3(b)(1), unless the authorization is terminated  or revoked sooner.       Influenza A by PCR NEGATIVE NEGATIVE Final   Influenza B by PCR NEGATIVE NEGATIVE Final    Comment: (NOTE) The Xpert  Xpress SARS-CoV-2/FLU/RSV plus assay is intended  as an aid in the diagnosis of influenza from Nasopharyngeal swab specimens and should not be used as a sole basis for treatment. Nasal washings and aspirates are unacceptable for Xpert Xpress SARS-CoV-2/FLU/RSV testing.  Fact Sheet for Patients: bloggercourse.com  Fact Sheet for Healthcare Providers: seriousbroker.it  This test is not yet approved or cleared by the [place]  FDA and has been authorized for detection and/or diagnosis of SARS-CoV-2 by FDA under an Emergency Use Authorization (EUA). This EUA will remain in effect (meaning this test can be used) for the duration of the COVID-19 declaration under Section 564(b)(1) of the Act, 21 U.S.C. section 360bbb-3(b)(1), unless the authorization is terminated or revoked.     Resp Syncytial Virus by PCR NEGATIVE NEGATIVE Final    Comment: (NOTE) Fact Sheet for Patients: bloggercourse.com  Fact Sheet for Healthcare Providers: seriousbroker.it  This test is not yet approved or cleared by the [place]  FDA and has been authorized for detection and/or diagnosis of SARS-CoV-2 by FDA under an Emergency Use Authorization (EUA). This EUA will remain in effect (meaning this test can be used) for the duration of the COVID-19 declaration under Section 564(b)(1) of the Act, 21 U.S.C. section 360bbb-3(b)(1), unless the authorization is terminated or revoked.  Performed at [facility], [address], [place], [place] [address]   Culture, blood (Routine X 2) w Reflex to ID Panel     Status: None   Collection Time: 01/18/23  8:20 PM   Specimen: BLOOD LEFT ARM  Result Value Ref Range Status   Specimen Description   Final    BLOOD LEFT ARM Performed at [facility], [address] [place], [place] [address]    Special Requests   Final    BOTTLES DRAWN  AEROBIC AND ANAEROBIC Blood Culture adequate volume Performed at [facility], [address], [place], [place] [address]    Culture   Final    NO GROWTH 5 DAYS Performed at [facility], [address], [place], [place] [address]    Report Status 01/23/2023 FINAL  Final  Culture, blood (Routine X 2) w Reflex to ID Panel     Status: None   Collection Time: 01/18/23  8:40 PM   Specimen: BLOOD RIGHT ARM  Result Value Ref Range Status   Specimen Description   Final    BLOOD RIGHT ARM Performed at [facility], [address], [place], [place] [address]    Special Requests   Final    BOTTLES DRAWN AEROBIC AND ANAEROBIC Blood Culture adequate volume Performed at [facility], [address] [place], [place] [address]    Culture   Final    NO GROWTH 5 DAYS Performed at [facility], [address], [place], [place] [address]    Report Status 01/23/2023 FINAL  Final    Labs: CBC: No results for input(s): WBC, NEUTROABS, HGB, HCT, MCV, PLT in the last 168 hours. Basic Metabolic Panel: No results for input(s): NA, K, CL, CO2, GLUCOSE, BUN, CREATININE, CALCIUM , MG, PHOS in the last 168 hours. Liver Function Tests: No results for input(s): AST, ALT, ALKPHOS, BILITOT, PROT, ALBUMIN in the last 168 hours. CBG: No results for input(s): GLUCAP in the last 168 hours.  Discharge time spent: less than 30 minutes.  Signed: Ejay Lashley, DO [facility] 02/24/2023

## 2023-03-20 NOTE — Progress Notes (Signed)
 Notified by daughter to check patient.Unresponsuve,no respirations, no pulse. Patient DNR.Physician notified. Time of Death.0100.I/I/25.ME called.[facility] [employer] notified.There was a phone  charger left by the daughter in the room.Postmortem care done.

## 2023-03-20 NOTE — Progress Notes (Signed)
    OVERNIGHT PROGRESS REPORT  Notified by RN that patient is deceased as of 0100 hrs.  Patient was DNR/CMO (comfort measures only) followed by Palliative Care.  2 RN verified.  Family was immediately available to RN.     Lynwood Kipper MSNA MSN ACNPC-AG Acute Care Nurse Practitioner [facility]

## 2023-03-20 DEATH — deceased

## 2023-03-30 ENCOUNTER — Ambulatory Visit: Payer: Medicare HMO | Admitting: Internal Medicine

## 2023-08-03 ENCOUNTER — Ambulatory Visit: Payer: Medicare HMO | Admitting: Internal Medicine
